# Patient Record
Sex: Female | Born: 1950 | Race: Black or African American | Hispanic: No | Marital: Married | Smoking: Never smoker
Health system: Southern US, Community
[De-identification: ages and names within clinical notes are randomized; demographics above are authoritative.]

## PROBLEM LIST (undated history)

## (undated) DIAGNOSIS — N189 Chronic kidney disease, unspecified: Secondary | ICD-10-CM

## (undated) DIAGNOSIS — I252 Old myocardial infarction: Secondary | ICD-10-CM

## (undated) DIAGNOSIS — N183 Chronic kidney disease, stage 3 unspecified: Secondary | ICD-10-CM

## (undated) DIAGNOSIS — N2581 Secondary hyperparathyroidism of renal origin: Secondary | ICD-10-CM

## (undated) DIAGNOSIS — Z8711 Personal history of peptic ulcer disease: Secondary | ICD-10-CM

## (undated) DIAGNOSIS — G500 Trigeminal neuralgia: Secondary | ICD-10-CM

## (undated) DIAGNOSIS — I3139 Other pericardial effusion (noninflammatory): Secondary | ICD-10-CM

## (undated) DIAGNOSIS — E119 Type 2 diabetes mellitus without complications: Secondary | ICD-10-CM

## (undated) DIAGNOSIS — S71109A Unspecified open wound, unspecified thigh, initial encounter: Secondary | ICD-10-CM

## (undated) DIAGNOSIS — I5032 Chronic diastolic (congestive) heart failure: Secondary | ICD-10-CM

## (undated) DIAGNOSIS — E1143 Type 2 diabetes mellitus with diabetic autonomic (poly)neuropathy: Secondary | ICD-10-CM

## (undated) DIAGNOSIS — I2510 Atherosclerotic heart disease of native coronary artery without angina pectoris: Secondary | ICD-10-CM

## (undated) DIAGNOSIS — R569 Unspecified convulsions: Secondary | ICD-10-CM

## (undated) DIAGNOSIS — B465 Mucormycosis, unspecified: Secondary | ICD-10-CM

## (undated) DIAGNOSIS — Z973 Presence of spectacles and contact lenses: Secondary | ICD-10-CM

## (undated) DIAGNOSIS — Z923 Personal history of irradiation: Secondary | ICD-10-CM

## (undated) DIAGNOSIS — Z9189 Other specified personal risk factors, not elsewhere classified: Secondary | ICD-10-CM

## (undated) DIAGNOSIS — I10 Essential (primary) hypertension: Secondary | ICD-10-CM

## (undated) DIAGNOSIS — Z860100 Personal history of colon polyps, unspecified: Secondary | ICD-10-CM

## (undated) DIAGNOSIS — E785 Hyperlipidemia, unspecified: Secondary | ICD-10-CM

## (undated) DIAGNOSIS — D631 Anemia in chronic kidney disease: Secondary | ICD-10-CM

## (undated) DIAGNOSIS — E010 Iodine-deficiency related diffuse (endemic) goiter: Secondary | ICD-10-CM

## (undated) DIAGNOSIS — Z9221 Personal history of antineoplastic chemotherapy: Secondary | ICD-10-CM

## (undated) DIAGNOSIS — Z87442 Personal history of urinary calculi: Secondary | ICD-10-CM

## (undated) DIAGNOSIS — E039 Hypothyroidism, unspecified: Secondary | ICD-10-CM

## (undated) DIAGNOSIS — I313 Pericardial effusion (noninflammatory): Secondary | ICD-10-CM

## (undated) DIAGNOSIS — K219 Gastro-esophageal reflux disease without esophagitis: Secondary | ICD-10-CM

## (undated) DIAGNOSIS — M109 Gout, unspecified: Secondary | ICD-10-CM

## (undated) DIAGNOSIS — Z8673 Personal history of transient ischemic attack (TIA), and cerebral infarction without residual deficits: Secondary | ICD-10-CM

## (undated) DIAGNOSIS — M1990 Unspecified osteoarthritis, unspecified site: Secondary | ICD-10-CM

## (undated) DIAGNOSIS — Z940 Kidney transplant status: Secondary | ICD-10-CM

## (undated) DIAGNOSIS — H5440 Blindness, one eye, unspecified eye: Secondary | ICD-10-CM

## (undated) DIAGNOSIS — C50919 Malignant neoplasm of unspecified site of unspecified female breast: Secondary | ICD-10-CM

## (undated) DIAGNOSIS — K3184 Gastroparesis: Secondary | ICD-10-CM

## (undated) DIAGNOSIS — G4733 Obstructive sleep apnea (adult) (pediatric): Secondary | ICD-10-CM

## (undated) HISTORY — DX: Malignant neoplasm of unspecified site of unspecified female breast: C50.919

## (undated) HISTORY — DX: Obstructive sleep apnea (adult) (pediatric): G47.33

## (undated) HISTORY — DX: Personal history of colon polyps, unspecified: Z86.0100

## (undated) HISTORY — PX: CARDIAC CATHETERIZATION: SHX172

## (undated) HISTORY — DX: Unspecified osteoarthritis, unspecified site: M19.90

## (undated) HISTORY — DX: Personal history of colonic polyps: Z86.010

## (undated) HISTORY — DX: Unspecified convulsions: R56.9

## (undated) HISTORY — PX: TRANSTHORACIC ECHOCARDIOGRAM: SHX275

## (undated) HISTORY — DX: Blindness, one eye, unspecified eye: H54.40

## (undated) HISTORY — DX: Gastro-esophageal reflux disease without esophagitis: K21.9

## (undated) HISTORY — PX: CATARACT EXTRACTION W/ INTRAOCULAR LENS  IMPLANT, BILATERAL: SHX1307

## (undated) HISTORY — PX: VAGINAL HYSTERECTOMY: SUR661

## (undated) HISTORY — DX: Hyperlipidemia, unspecified: E78.5

## (undated) HISTORY — PX: CARDIOVASCULAR STRESS TEST: SHX262

## (undated) HISTORY — DX: Gout, unspecified: M10.9

## (undated) HISTORY — DX: Chronic diastolic (congestive) heart failure: I50.32

---

## 2001-01-14 HISTORY — PX: EYE SURGERY: SHX253

## 2006-01-14 HISTORY — PX: CHOLECYSTECTOMY: SHX55

## 2006-04-15 HISTORY — PX: KIDNEY TRANSPLANT: SHX239

## 2006-05-15 HISTORY — PX: TRANSPLANTATION RENAL: SUR1385

## 2008-01-15 HISTORY — PX: BREAST BIOPSY: SHX20

## 2009-01-31 HISTORY — PX: OTHER SURGICAL HISTORY: SHX169

## 2009-02-14 LAB — HM COLONOSCOPY: HM Colonoscopy: ABNORMAL

## 2009-06-21 ENCOUNTER — Ambulatory Visit: Payer: Self-pay | Admitting: Cardiology

## 2009-06-21 ENCOUNTER — Inpatient Hospital Stay (HOSPITAL_COMMUNITY): Admission: EM | Admit: 2009-06-21 | Discharge: 2009-06-25 | Payer: Self-pay | Admitting: Emergency Medicine

## 2009-06-22 ENCOUNTER — Encounter: Payer: Self-pay | Admitting: Cardiology

## 2009-06-23 ENCOUNTER — Encounter: Payer: Self-pay | Admitting: Cardiology

## 2009-06-28 ENCOUNTER — Telehealth: Payer: Self-pay | Admitting: Cardiology

## 2009-07-14 DIAGNOSIS — J45909 Unspecified asthma, uncomplicated: Secondary | ICD-10-CM | POA: Insufficient documentation

## 2009-07-14 DIAGNOSIS — K219 Gastro-esophageal reflux disease without esophagitis: Secondary | ICD-10-CM

## 2009-07-14 DIAGNOSIS — Z87442 Personal history of urinary calculi: Secondary | ICD-10-CM | POA: Insufficient documentation

## 2009-07-14 DIAGNOSIS — E1159 Type 2 diabetes mellitus with other circulatory complications: Secondary | ICD-10-CM | POA: Insufficient documentation

## 2009-07-14 DIAGNOSIS — I2510 Atherosclerotic heart disease of native coronary artery without angina pectoris: Secondary | ICD-10-CM | POA: Insufficient documentation

## 2009-07-14 DIAGNOSIS — I119 Hypertensive heart disease without heart failure: Secondary | ICD-10-CM

## 2009-07-14 DIAGNOSIS — Z940 Kidney transplant status: Secondary | ICD-10-CM | POA: Insufficient documentation

## 2009-07-14 DIAGNOSIS — I214 Non-ST elevation (NSTEMI) myocardial infarction: Secondary | ICD-10-CM | POA: Insufficient documentation

## 2009-08-04 ENCOUNTER — Ambulatory Visit: Payer: Self-pay | Admitting: Cardiology

## 2009-08-14 ENCOUNTER — Ambulatory Visit: Payer: Self-pay | Admitting: Cardiology

## 2009-09-11 ENCOUNTER — Encounter: Payer: Self-pay | Admitting: Internal Medicine

## 2009-09-27 ENCOUNTER — Ambulatory Visit: Payer: Self-pay | Admitting: Cardiology

## 2009-09-27 ENCOUNTER — Telehealth: Payer: Self-pay | Admitting: Cardiology

## 2009-09-29 ENCOUNTER — Telehealth: Payer: Self-pay | Admitting: Cardiology

## 2009-09-29 LAB — CONVERTED CEMR LAB
BUN: 19 mg/dL (ref 6–23)
CO2: 29 meq/L (ref 19–32)
Calcium: 9.7 mg/dL (ref 8.4–10.5)
Chloride: 106 meq/L (ref 96–112)
Creatinine, Ser: 0.8 mg/dL (ref 0.4–1.2)
GFR calc non Af Amer: 91.89 mL/min (ref >60)
Glucose, Bld: 105 mg/dL — ABNORMAL HIGH (ref 70–99)
Potassium: 4.4 meq/L (ref 3.5–5.1)
Sodium: 142 meq/L (ref 135–145)

## 2009-10-03 ENCOUNTER — Ambulatory Visit: Payer: Self-pay | Admitting: Cardiology

## 2009-10-03 DIAGNOSIS — R609 Edema, unspecified: Secondary | ICD-10-CM

## 2009-10-09 LAB — CONVERTED CEMR LAB
BUN: 22 mg/dL (ref 6–23)
CO2: 29 meq/L (ref 19–32)
Calcium: 10 mg/dL (ref 8.4–10.5)
Chloride: 103 meq/L (ref 96–112)
Creatinine, Ser: 1.1 mg/dL (ref 0.4–1.2)
GFR calc non Af Amer: 69.08 mL/min (ref >60)
Glucose, Bld: 170 mg/dL — ABNORMAL HIGH (ref 70–99)
Potassium: 4.5 meq/L (ref 3.5–5.1)
Sodium: 140 meq/L (ref 135–145)

## 2009-10-10 ENCOUNTER — Ambulatory Visit: Payer: Self-pay | Admitting: Cardiology

## 2009-10-10 ENCOUNTER — Telehealth: Payer: Self-pay | Admitting: Cardiology

## 2009-10-17 ENCOUNTER — Ambulatory Visit: Payer: Self-pay | Admitting: Internal Medicine

## 2009-10-17 ENCOUNTER — Inpatient Hospital Stay (HOSPITAL_COMMUNITY): Admission: EM | Admit: 2009-10-17 | Discharge: 2009-10-22 | Payer: Self-pay | Admitting: Emergency Medicine

## 2009-10-17 ENCOUNTER — Encounter: Payer: Self-pay | Admitting: Cardiology

## 2009-11-01 ENCOUNTER — Telehealth (INDEPENDENT_AMBULATORY_CARE_PROVIDER_SITE_OTHER): Payer: Self-pay | Admitting: *Deleted

## 2009-11-03 ENCOUNTER — Telehealth: Payer: Self-pay | Admitting: Cardiology

## 2009-11-08 ENCOUNTER — Encounter: Payer: Self-pay | Admitting: Cardiology

## 2009-11-14 ENCOUNTER — Ambulatory Visit: Payer: Self-pay | Admitting: Internal Medicine

## 2009-11-14 ENCOUNTER — Emergency Department (HOSPITAL_COMMUNITY): Admission: EM | Admit: 2009-11-14 | Discharge: 2009-11-15 | Payer: Self-pay | Admitting: Emergency Medicine

## 2009-11-14 DIAGNOSIS — K3184 Gastroparesis: Secondary | ICD-10-CM

## 2009-11-14 DIAGNOSIS — I2510 Atherosclerotic heart disease of native coronary artery without angina pectoris: Secondary | ICD-10-CM | POA: Insufficient documentation

## 2009-11-14 DIAGNOSIS — H40009 Preglaucoma, unspecified, unspecified eye: Secondary | ICD-10-CM | POA: Insufficient documentation

## 2009-11-14 DIAGNOSIS — Z860100 Personal history of colon polyps, unspecified: Secondary | ICD-10-CM | POA: Insufficient documentation

## 2009-11-14 DIAGNOSIS — I519 Heart disease, unspecified: Secondary | ICD-10-CM

## 2009-11-14 DIAGNOSIS — E785 Hyperlipidemia, unspecified: Secondary | ICD-10-CM | POA: Insufficient documentation

## 2009-11-14 DIAGNOSIS — K279 Peptic ulcer, site unspecified, unspecified as acute or chronic, without hemorrhage or perforation: Secondary | ICD-10-CM | POA: Insufficient documentation

## 2009-11-14 DIAGNOSIS — M545 Low back pain: Secondary | ICD-10-CM

## 2009-11-14 DIAGNOSIS — Z8679 Personal history of other diseases of the circulatory system: Secondary | ICD-10-CM | POA: Insufficient documentation

## 2009-11-14 DIAGNOSIS — D630 Anemia in neoplastic disease: Secondary | ICD-10-CM | POA: Insufficient documentation

## 2009-11-14 DIAGNOSIS — I252 Old myocardial infarction: Secondary | ICD-10-CM | POA: Insufficient documentation

## 2009-11-14 DIAGNOSIS — E1139 Type 2 diabetes mellitus with other diabetic ophthalmic complication: Secondary | ICD-10-CM | POA: Insufficient documentation

## 2009-11-14 DIAGNOSIS — M549 Dorsalgia, unspecified: Secondary | ICD-10-CM | POA: Insufficient documentation

## 2009-11-14 DIAGNOSIS — R062 Wheezing: Secondary | ICD-10-CM

## 2009-11-14 LAB — CONVERTED CEMR LAB
BUN: 22 mg/dL (ref 6–23)
Basophils Absolute: 0 10*3/uL (ref 0.0–0.1)
Basophils Relative: 0.2 % (ref 0.0–3.0)
CO2: 27 meq/L (ref 19–32)
Calcium: 9.5 mg/dL (ref 8.4–10.5)
Chloride: 106 meq/L (ref 96–112)
Cholesterol: 103 mg/dL (ref 0–200)
Creatinine, Ser: 0.9 mg/dL (ref 0.4–1.2)
Eosinophils Absolute: 0.1 10*3/uL (ref 0.0–0.7)
Eosinophils Relative: 1.1 % (ref 0.0–5.0)
Folate: 19.1 ng/mL
GFR calc non Af Amer: 80.43 mL/min (ref >60)
Glucose, Bld: 250 mg/dL — ABNORMAL HIGH (ref 70–99)
HCT: 40.6 % (ref 36.0–46.0)
HDL: 37.8 mg/dL — ABNORMAL LOW (ref >39.00)
Hemoglobin: 13.4 g/dL (ref 12.0–15.0)
Hgb A1c MFr Bld: 8 % — ABNORMAL HIGH (ref 4.6–6.5)
Iron: 48 ug/dL (ref 42–145)
LDL Cholesterol: 36 mg/dL (ref 0–99)
Lymphocytes Relative: 9.6 % — ABNORMAL LOW (ref 12.0–46.0)
Lymphs Abs: 0.7 10*3/uL (ref 0.7–4.0)
MCHC: 32.9 g/dL (ref 30.0–36.0)
MCV: 86.4 fL (ref 78.0–100.0)
Monocytes Absolute: 0.4 10*3/uL (ref 0.1–1.0)
Monocytes Relative: 5.4 % (ref 3.0–12.0)
Neutro Abs: 5.8 10*3/uL (ref 1.4–7.7)
Neutrophils Relative %: 83.7 % — ABNORMAL HIGH (ref 43.0–77.0)
Platelets: 273 10*3/uL (ref 150.0–400.0)
Potassium: 4.8 meq/L (ref 3.5–5.1)
Pro B Natriuretic peptide (BNP): 625.3 pg/mL — ABNORMAL HIGH (ref 0.0–100.0)
RBC: 4.69 M/uL (ref 3.87–5.11)
RDW: 15.8 % — ABNORMAL HIGH (ref 11.5–14.6)
Saturation Ratios: 17.4 % — ABNORMAL LOW (ref 20.0–50.0)
Sodium: 139 meq/L (ref 135–145)
TSH: 0.46 microintl units/mL (ref 0.35–5.50)
Total CHOL/HDL Ratio: 3
Transferrin: 197.4 mg/dL — ABNORMAL LOW (ref 212.0–360.0)
Triglycerides: 146 mg/dL (ref 0.0–149.0)
VLDL: 29.2 mg/dL (ref 0.0–40.0)
Vitamin B-12: 446 pg/mL (ref 211–911)
WBC: 6.9 10*3/uL (ref 4.5–10.5)

## 2009-11-22 ENCOUNTER — Telehealth: Payer: Self-pay | Admitting: Internal Medicine

## 2009-11-24 ENCOUNTER — Encounter (INDEPENDENT_AMBULATORY_CARE_PROVIDER_SITE_OTHER): Payer: Self-pay | Admitting: *Deleted

## 2009-11-27 ENCOUNTER — Ambulatory Visit: Payer: Self-pay | Admitting: Internal Medicine

## 2009-11-27 ENCOUNTER — Telehealth: Payer: Self-pay | Admitting: Internal Medicine

## 2009-11-27 DIAGNOSIS — I5032 Chronic diastolic (congestive) heart failure: Secondary | ICD-10-CM

## 2009-11-28 ENCOUNTER — Encounter (INDEPENDENT_AMBULATORY_CARE_PROVIDER_SITE_OTHER): Payer: Self-pay | Admitting: *Deleted

## 2009-11-29 ENCOUNTER — Encounter: Payer: Self-pay | Admitting: Internal Medicine

## 2009-11-30 ENCOUNTER — Encounter: Payer: Self-pay | Admitting: Internal Medicine

## 2009-12-04 ENCOUNTER — Telehealth: Payer: Self-pay | Admitting: Internal Medicine

## 2009-12-04 ENCOUNTER — Ambulatory Visit: Payer: Self-pay | Admitting: Cardiology

## 2009-12-06 LAB — CONVERTED CEMR LAB
BUN: 23 mg/dL (ref 6–23)
CO2: 29 meq/L (ref 19–32)
Calcium: 9.3 mg/dL (ref 8.4–10.5)
Chloride: 102 meq/L (ref 96–112)
Creatinine, Ser: 1 mg/dL (ref 0.4–1.2)
GFR calc non Af Amer: 69.8 mL/min (ref >60)
Glucose, Bld: 59 mg/dL — ABNORMAL LOW (ref 70–99)
Potassium: 4.9 meq/L (ref 3.5–5.1)
Sodium: 141 meq/L (ref 135–145)

## 2009-12-13 ENCOUNTER — Telehealth (INDEPENDENT_AMBULATORY_CARE_PROVIDER_SITE_OTHER): Payer: Self-pay | Admitting: *Deleted

## 2010-02-02 ENCOUNTER — Encounter: Payer: Self-pay | Admitting: Internal Medicine

## 2010-02-09 ENCOUNTER — Encounter: Payer: Self-pay | Admitting: Internal Medicine

## 2010-02-12 ENCOUNTER — Other Ambulatory Visit: Payer: Self-pay | Admitting: Internal Medicine

## 2010-02-12 DIAGNOSIS — E049 Nontoxic goiter, unspecified: Secondary | ICD-10-CM

## 2010-02-13 NOTE — Assessment & Plan Note (Signed)
Summary: eph/pt aware she is seeing [facility] pa[facility]   Visit Type:  Follow-up Primary Provider:  Blanca Friend .[place]  CC:  no complaints.  History of Present Illness: Primary Cardiologist:  Dr. Valera Castle   Monica Lee is a 60 year old female with a history of non-obstructive coronary artery disease by cardiac catheterization in [place] who has been admitted to the hospital twice this year with chest discomfort and elevated cardiac enzymes.  With a recent reassuring cardiac cath, no further workup was pursued.  Her most recent admission was 10/17/09.  She was admitted with hypertensive crisis and acute on chronic diastolic heart failure.  She was diuresed and her blood pressure was managed.  An echocardiogram demonstrated severe LVH with an ejection fraction of 50-55% and mild left atrial enlargement.  Her highest troponin was 0.15.  She recently went to the emergency room on November 1.  She had pulmonary vascular congestion on the chest x-ray and a BNP of 352.  She was asked to followup here.  Of note, she does have a history of end-stage renal disease.  She is status post renal transplant with normal creatinines now.  Today, she states that she is doing well.  She denies significant shortness of breath.  She describes New York Heart Association class II symptoms.  She denies syncope or near-syncope.  She denies orthopnea, PND or significant pedal edema.  She does feel somewhat bloated.  She has been tracking her weights at home.  She feels better when she is around 184 pounds.  Recently her weight has gone up.  She denies chest pain.  Current Medications (verified): 1)  Aspirin Ec 325 Mg Tbec (Aspirin) .... Take One Tablet By Mouth Daily 2)  Pravastatin Sodium 20 Mg Tabs (Pravastatin Sodium) .... 1/2 Tab At Bedtime 3)  Imuran 50 Mg Tabs (Azathioprine) .Marland Kitchen.. 1 Tab Two Times A Day 4)  Clonidine Hcl 0.2 Mg Tabs (Clonidine Hcl) .Marland Kitchen.. 1 Tab Three Times A Day 5)  Minoxidil 2.5 Mg Tabs  (Minoxidil) .Marland Kitchen.. 1 Tab Two Times A Day 6)  Lantus 100 Unit/ml Soln (Insulin Glargine) .... 60 Units Once Daily 7)  Magnesium Oxide 250 Mg Tabs (Magnesium Oxide) .Marland Kitchen.. 1 Tab Two Times A Day 8)  Metoprolol Tartrate 100 Mg Tabs (Metoprolol Tartrate) .... Take 1 and 1/2 Tablets Twice A Day 9)  Novolog 100 Unit/ml Soln (Insulin Aspart) .... 40 Units Three Times A Day W/meals 10)  Prednisone 10 Mg Tabs (Prednisone) .Marland Kitchen.. 1 Tab Once Daily 11)  Prograf 1 Mg Caps (Tacrolimus) .... 6 Caps Once Daily 12)  Protonix 40 Mg Tbec (Pantoprazole Sodium) .Marland Kitchen.. 1 Tab Once Daily 13)  Multivitamins  Tabs (Multiple Vitamin) .Marland Kitchen.. 1 By Mouth Once Daily 14)  Miralax  Powd (Polyethylene Glycol 3350) .Marland Kitchen.. 1 Scoop Daily 15)  Docusate Sodium 100 Mg Caps (Docusate Sodium) .Marland Kitchen.. 1 By Mouth Two Times A Day At Bedtime 16)  Combigan 0.2-0.5 % Soln (Brimonidine Tartrate-Timolol) .Marland Kitchen.. 1 Drop Both Eyes Two Times A Day 17)  Isosorbide Mononitrate Cr 60 Mg Xr24h-Tab (Isosorbide Mononitrate) .Marland Kitchen.. 1po Once Daily 18)  Nitrostat 0.4 Mg Subl (Nitroglycerin) .... Use Asd 19)  Norvasc 10 Mg Tabs (Amlodipine Besylate) .Marland Kitchen.. 1 By Mouth Once Daily 20)  Novolog Flexpen 100 Unit/ml Soln (Insulin Aspart) .... 40 Units Three Times A Day  Allergies: 1)  ! Darvocet 2)  ! Keflex  Past History:  Past Medical History: Reviewed history from 11/14/2009 and no changes required. MYOCARDIAL INFARCTION, ACUTE, SUBENDOCARDIAL (ICD-410.70) CAD,  NATIVE VESSEL (ICD-414.01) HYPERTENSION (ICD-401.9) NEPHROLITHIASIS, HX OF (ICD-V13.01) KIDNEY TRANSPLANTATION (ICD-V42.0) DIABETES MELLITUS, TYPE II (ICD-250.00) GERD (ICD-530.81) ASTHMA (ICD-493.90) MD roster:  card:  Dr Daleen Squibb                    renal: [facility] - Dr Ervin Knack                     GI:  [place]  - Dr patel gastroparesis ?? 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  Diabetic retinopathy  Past Surgical History: Reviewed history from 11/14/2009 and no changes required. Status post renal transplant Status post cardiac catheterization   Hysterectomy Cholecystectomy Cataract extraction s/p intestinal perforation surgury jan 2011 s/p breast biopsy 2010  Vital Signs:  Patient profile:   60 year old female Height:      65 inches Weight:      189 pounds Pulse rate:   80 / minute Pulse rhythm:   regular BP sitting:   130 / 77  (left arm)  Vitals Entered By: Jacquelin Hawking, CMA (November 27, 2009 9:35 AM)  Physical Exam  General:  Well nourished, well developed, in no acute distress HEENT: normal Neck: no JVD Cardiac:  normal S1, S2; RRR; no murmur Lungs:  clear to auscultation bilaterally, no wheezing, rhonchi or rales Abd: soft, nontender, no hepatomegaly Ext: trace edema Vascular: no carotid  bruits Skin: warm and dry Neuro:  CNs 2-12 intact, no focal abnormalities noted    Impression & Recommendations:  Problem # 1:  CORONARY ARTERY DISEASE (ICD-414.00)  No anginal symptoms. Continue ASA.  Problem # 2:  CHRONIC DIASTOLIC HEART FAILURE (ICD-428.32)  Her baseline weight is probably somewhere around 183-184 pounds. I have asked her to take Lasix and K+ for 3 days. She will have a BMET in 1 week. After the above, she will monitor her weight and take Lasix if her weight goes above 187 pounds or if she has symptoms.  Problem # 3:  HYPERTENSION (ICD-401.9)  Borderline control. But, she has been out of Isosorbide for a few days.  Problem # 4:  KIDNEY TRANSPLANTATION (ICD-V42.0)  She is to be set up with a nephrologist soon.  Problem # 5:  GERD (ICD-530.81)  Patient Instructions: 1)  Your physician recommends that you schedule a follow-up appointment in:3  months with Dr. Daleen Squibb. 2)  Take Lasix (Furosemide) 40 mg with Potassium 20 mEq once daily for 3 days. 3)  Then, take as needed.  If you weigh more than 187 pounds, you can take Lasix + Potassium on those days.  If your weight is up and you feel short of breath, you should take the Lasix and call us.  4)  Your physician recommends that you return for lab work in: One week for BMET . [phone]. Prescriptions: ISOSORBIDE MONONITRATE CR 60 MG XR24H-TAB (ISOSORBIDE MONONITRATE) 1po once daily  #90 x 3   Entered by:   Ollen Gross, RN, BSN   Authorized by:   Tereso Newcomer PA-C   Signed by:   Ollen Gross, RN, BSN on 11/27/2009   Method used:   Electronically to        [facility] [phone]* (retail)  [address]       [place], [place]  [address]       Ph: [phone]       Fax: [phone]   RxID:   [id number]  I have personally reviewed the prescriptions today for accuracy. Tereso Newcomer PA-C  November 27, 2009 10:27 AM

## 2010-02-13 NOTE — Progress Notes (Signed)
Summary: chestpressure  Phone Note Call from Patient Call back at Home Phone [phone]   Caller: Daughter-CHRISTINE [phone] Reason for Call: Talk to Nurse Summary of Call: per pt daughter called pt c/o chestpressure. pcp was not contacted. Initial call taken by: Lorne Skeens,  June 28, 2009 8:41 AM  Follow-up for Phone Call        [facility] Scherrie Bateman, LPN  June 28, 2009 11:41 AM lmtcb Scherrie Bateman, LPN  June 29, 2009 8:42 AM  SPOKE WITH DAUGHTER THIS AM  WAS DISCONNECTED WILL CALL BACK LATER. Follow-up by: Scherrie Bateman, LPN,  June 29, 2009 11:11 AM  Additional Follow-up for Phone Call Additional follow up Details #1::        LMTCB Scherrie Bateman, LPN  June 29, 2009 3:17 PM LEFT MESSAGE FOR DAUGHTER TO CALL TOMORROW TO CONT WITH MESSAGE Preliminarily reviewed. Forwarded to MD desktop for review and signature   LMTCB Scherrie Bateman, LPN  June 30, 2009 10:28 AM    Additional Follow-up for Phone Call Additional follow up Details #2::    CALLED HOME NUMBER AND SPOKE WITH PT  PER PT FEELS GOOD NO MORE EPISODES OF CHEST PAIN. ALSO C/O OF ONE EPISODE OF DIARRHEA WHEN HAD CHEST PAIN .  NEEDS  AN APPT PER HOSPITAL DISCHARGE. APPT SCHEDULED  WITH DR Yolanda Dockendorf FOR JULY 22,2011 AT 4:30  Follow-up by: Scherrie Bateman, LPN,  June 30, 2009 10:43 AM  Additional Follow-up for Phone Call Additional follow up Details #3:: Details for Additional Follow-up Action Taken: ok  Reviewed Juanito Doom, MD

## 2010-02-13 NOTE — Progress Notes (Signed)
Summary: swelling / waiting on BMP results  Phone Note Call from Patient   Caller: Patient [phone] Reason for Call: Talk to Nurse Summary of Call: pt has fluid build up-stomach swollen, face, her legs and feet have gone down some-weighs about 10lbs more in the past week or so-pls call [phone] Initial call taken by: Glynda Jaeger,  September 27, 2009 9:13 AM  Follow-up for Phone Call        pt states that initially she gained 13 pounds of fluid and this morning she is up 10 pounds. she is taking lasix as prescribed. adv pt will discuss w/dod and adv. Claris Gladden, RN DOD Dr. Roswell Miners to have pt take Lasix two times a day and per pt she is already taking pill two times a day, she will come in now for [facility] and will have Dr. Vern Claude nurse see if she can double book a day so that she can be seen next week. Claris Gladden, RN 09-27-09 1034 f/u appt 9/20. await lab results. Claris Gladden, RN [phone]  Additional Follow-up for Phone Call Additional follow up Details #1::        BMP normal. Dr. Johney Frame recommend checking how long pt has been taking lasix two times a day and if just 1 week have her take 80mg  am and 40mg  pm. lft msg for pt to c/b Claris Gladden, RN 9/14 1545 spoke to pt and she has been taking 40mg  lasix two times a day since june 12. this past sat she had mouth surgery and took keflex and stated has had swelling since. will discuss w/dod. Claris Gladden, RN     Additional Follow-up for Phone Call Additional follow up Details #2::    09/27/09 1552 adv pt to take 80mg  in am and 40mg  lasix pm for next 3 days. Claris Gladden, RN Follow-up by: Gaylord Shih, MD, [facility],  September 29, 2009 10:43 AM  Prescriptions: FUROSEMIDE 40 MG TABS (FUROSEMIDE) 1 tab once daily as needed  #30 x 6   Entered by:   Claris Gladden RN   Authorized by:   Gaylord Shih, MD, [facility]   Signed by:   Claris Gladden RN on 09/27/2009   Method used:   Electronically to        [facility]  [phone]* (retail)       [address]       [place], [place]  [address]       Ph: [phone]       Fax: [phone]   RxID:   [id number]

## 2010-02-13 NOTE — Consult Note (Signed)
Summary: [facility]   [facility]   Imported By: Roderic Ovens 10/31/2009 15:07:14  _____________________________________________________________________  External Attachment:    Type:   Image     Comment:   External Document

## 2010-02-13 NOTE — Consult Note (Signed)
Summary: Consultation Report  Consultation Report   Imported By: Debby Freiberg 07/18/2009 13:49:23  _____________________________________________________________________  External Attachment:    Type:   Image     Comment:   External Document

## 2010-02-13 NOTE — Letter (Signed)
Summary: Generic Letter  [facility]  [address] [place], [place] [address]   Phone: [phone]  Fax: [phone]    11/28/2009  Emaley Reier [address] [place], [place]  [address]  To Whom it may concern,  This is to inform that Lillyrose Reitan is on Protonix 40mg . Please contact our office for further questions.           Sincerely,   Dr. Oliver Barre

## 2010-02-13 NOTE — Letter (Signed)
Summary: Generic Letter  [facility]  [address] [place], [place] [address]   Phone: [phone]  Fax: [phone]    11/24/2009  Monica Lee [address] [place], [place]  [address]  Dear Ms. Templer,  Our office has been trying to contact you because we recieved notification form [facility] in regards to your prescription for Prontonix 40mg . Your insurance company has decided that a coverage review is need for this prescription because you have not tried and failed on their preferred drugs, Omeprazole or Nexium. Because you have not tried these medicines the review is likely to be denied.  Dr Estil Daft that Nexium 40mg  1 tablet by mouth once daily  will be a suitable substitute for you and we have been trying to contact you for your approval to change to this medicine. Would you please contact our office at your earliest convinence to discuus this matter.   Thank you for your prompt response.       Sincerely,   Margaret Pyle, CMA

## 2010-02-13 NOTE — Progress Notes (Signed)
Summary: PA protonix  Phone Note From Pharmacy   Summary of Call: Received fax from pharmacy stating that PA is required on Protonix 40mg  tabs, need to call [phone]. Called insurance spoke with [place] who stated that alternatives are  nexium 40mg  or Omeprazole 40mg . Would you like to switch or proceed with prior auth. Please advise Thanks.Alvy Beal Archie CMA  November 22, 2009 2:22 PM   Follow-up for Phone Call        please call pt - I think Nexium 40mg  would be fine;  if she agrees we can change to the nexium Follow-up by: Corwin Levins MD,  November 22, 2009 5:07 PM  Additional Follow-up for Phone Call Additional follow up Details #1::        Left detailed vm on pt's cell # listed on HIPPA form. Req that she call office back regarding above.............Marland KitchenLamar Sprinkles, CMA  November 22, 2009 5:34 PM  left message on machine in detail for pt regarding PA [phone]) per HIPAA. Margaret Pyle, CMA  November 24, 2009 8:03 AM     Additional Follow-up for Phone Call Additional follow up Details #2::    called and left vm.  Follow-up by: Alysia Penna,  November 24, 2009 1:49 PM  Additional Follow-up for Phone Call Additional follow up Details #3:: Details for Additional Follow-up Action Taken: Letter has been generated requesting pt to contact our office on this matter. phone note closed until further contact from pt. Additional Follow-up by: Margaret Pyle, CMA,  November 24, 2009 2:21 PM

## 2010-02-13 NOTE — Assessment & Plan Note (Signed)
Summary: bp check/dfg  Nurse Visit   Vital Signs:  Patient profile:   60 year old female Height:      65 inches Weight:      180.25 pounds Pulse rate:   65 / minute Pulse rhythm:   regular BP sitting:   138 / 74  (left arm) Cuff size:   large   Allergies: 1)  ! Darvocet  Appended Document: bp check/dfg Mrs. Ledwith BP on arrival was 146/90.  Pulse 70  After 5 minutes B/P was as documented above.  Pt brought in current medication bottles and it was noted the Metoprolol had been filled improperly( 25mg  instead of 100mg  as e-scribe) by the pharmacist.  She had not started the new bottle but, commented the pills were much smaller.  Pharmacist notified and corrected.  Lisabeth Devoid RN

## 2010-02-13 NOTE — Progress Notes (Signed)
Summary: Rx change  Phone Note Call from Patient Call back at Home Phone [phone]   Summary of Call: Pt called stating that change from Protonix to Nexium would be okay, Rx to [facility] [address] Initial call taken by: Margaret Pyle, CMA,  December 04, 2009 4:04 PM    New/Updated Medications: NEXIUM 40 MG CPDR (ESOMEPRAZOLE MAGNESIUM) 1 tab by mouth once daily Prescriptions: NEXIUM 40 MG CPDR (ESOMEPRAZOLE MAGNESIUM) 1 tab by mouth once daily  #90 x 1   Entered by:   Margaret Pyle, CMA   Authorized by:   Corwin Levins MD   Signed by:   Margaret Pyle, CMA on 12/04/2009   Method used:   Electronically to        [facility] [phone]* (retail)       [address]       [place], [place]  [address]       Ph: [phone]       Fax: [phone]   RxID:   [id number]

## 2010-02-13 NOTE — Assessment & Plan Note (Signed)
Summary: BP CHECK  Nurse Visit   Vital Signs:  Patient profile:   60 year old female Weight:      189.50 pounds Pulse rate:   60 / minute Pulse rhythm:   regular BP sitting:   132 / 82  (left arm) Cuff size:   large  Vitals Entered By: Sherri Rad, RN, BSN (October 10, 2009 11:31 AM) CC: No cardiac complaints   Current Medications (verified): 1)  Aspirin Ec 325 Mg Tbec (Aspirin) .... Take One Tablet By Mouth Daily 2)  Pravastatin Sodium 20 Mg Tabs (Pravastatin Sodium) .Marland Kitchen.. 1 Tab At Bedtime 3)  Imuran 50 Mg Tabs (Azathioprine) .Marland Kitchen.. 1 Tab Two Times A Day 4)  Clonidine Hcl 0.2 Mg Tabs (Clonidine Hcl) .Marland Kitchen.. 1 Tab Three Times A Day 5)  Minoxidil 2.5 Mg Tabs (Minoxidil) .Marland Kitchen.. 1 Tab Two Times A Day 6)  Lantus 100 Unit/ml Soln (Insulin Glargine) .... 50 Units Once Daily 7)  Furosemide 40 Mg Tabs (Furosemide) .... Take Two Tablets By Mouth Every Morning and One Tablet By Mouth Every Evening 8)  Magnesium Oxide 250 Mg Tabs (Magnesium Oxide) .Marland Kitchen.. 1 Tab Two Times A Day 9)  Metoprolol Tartrate 100 Mg Tabs (Metoprolol Tartrate) .... Take 1 and 1/2 Tablets Twice A Day 10)  Novolog 100 Unit/ml Soln (Insulin Aspart) .... 40 Units Three Times A Day W/meals 11)  Potassium Chloride Crys Cr 20 Meq Cr-Tabs (Potassium Chloride Crys Cr) .Marland Kitchen.. 1 Tab Once Daily 12)  Prednisone 10 Mg Tabs (Prednisone) .Marland Kitchen.. 1 Tab Once Daily 13)  Prograf 1 Mg Caps (Tacrolimus) .... 6 Caps Once Daily 14)  Protonix 40 Mg Tbec (Pantoprazole Sodium) .Marland Kitchen.. 1 Tab Once Daily 15)  Zofran 4 Mg Tabs (Ondansetron Hcl) .Marland Kitchen.. 1 Tab As Needed  Allergies (verified): 1)  ! Darvocet 2)  ! Keflex  Visit Type:  BP check Primary Provider:  Blanca Friend .[place]  CC:  No cardiac complaints.  History of Present Illness: Reviewed pt's readings with Dr. Daleen Squibb. Per Dr. Daleen Squibb- no change to medications. The pt should have another bp & weight check in 1 week.  Prescriptions: FUROSEMIDE 40 MG TABS (FUROSEMIDE) take two tablets by mouth every  morning and one tablet by mouth every evening  #90 x 6   Entered by:   Sherri Rad, RN, BSN   Authorized by:   Gaylord Shih, MD, [facility]   Signed by:   Sherri Rad, RN, BSN on 10/10/2009   Method used:   Electronically to        [facility] [phone]* (retail)       [address]       [place], [place]  [address]       Ph: [phone]       Fax: [phone]   RxID:   [id number]   Appended Document: BP CHECK LMTCB re bp/wt check in 1 week. Mylo Red RN

## 2010-02-13 NOTE — Progress Notes (Signed)
  Walk in Patient Form Recieved " Pt new to area needs Doctor" sent to Message Nurse [facility]  November 01, 2009 12:57 PM     Appended Document:     Clinical Lists Changes  Orders: Added new Referral order of Nephrology Referral (Nephro) - Signed

## 2010-02-13 NOTE — Progress Notes (Signed)
Summary: refill meds  Phone Note Refill Request Call back at Home Phone [phone] Message from:  Patient on October 10, 2009 3:11 PM  Refills Requested: Medication #1:  MINOXIDIL 2.5 MG TABS 1 tab two times a day [facility] [address]    Method Requested: Fax to Local Pharmacy Initial call taken by: Lorne Skeens,  October 10, 2009 3:12 PM    Prescriptions: MINOXIDIL 2.5 MG TABS (MINOXIDIL) 1 tab two times a day  #60 x 11   Entered by:   Danielle Rankin, CMA   Authorized by:   Gaylord Shih, MD, [facility]   Signed by:   Danielle Rankin, CMA on 10/10/2009   Method used:   Electronically to        [facility] [phone]* (retail)       [address]       [place], [place]  [address]       Ph: [phone]       Fax: [phone]   RxID:   [id number]

## 2010-02-13 NOTE — Assessment & Plan Note (Signed)
Summary: 1wk f/u labs drawn on 09-27-09/sl   Visit Type:  6 mo f/u Primary Provider:  Blanca Friend [place]  CC:  pt weight is up 12.5 lbs since 08/2009......sob.Marland Kitchen...edema/abdomen/feet....denies any cp.  History of Present Illness: [name] comes in today as an add-on. She is call the office over the last couple weeks with increased weight gain of up to 12 pounds or so. She feels this was triggered by tooth extraction on 1 September.  We increased her furosemide to 80 mg in the morning and 40 in the evening. Her weight is dropped from 193-191.8. Electrolytes on the 14th were stable with a creatinine of 0.8 and a BUN of 19. Her potassium was 4.4.  Her edema has improved significantly. There she denies orthopnea or PND. She's had no chest pain. She is watching her sodium. She has trouble with her blood pressure all her life.  Current Medications (verified): 1)  Aspirin Ec 325 Mg Tbec (Aspirin) .... Take One Tablet By Mouth Daily 2)  Pravastatin Sodium 20 Mg Tabs (Pravastatin Sodium) .Marland Kitchen.. 1 Tab At Bedtime 3)  Imuran 50 Mg Tabs (Azathioprine) .Marland Kitchen.. 1 Tab Two Times A Day 4)  Clonidine Hcl 0.2 Mg Tabs (Clonidine Hcl) .Marland Kitchen.. 1 Tab Three Times A Day 5)  Minoxidil 2.5 Mg Tabs (Minoxidil) .Marland Kitchen.. 1 Tab Two Times A Day 6)  Lantus 100 Unit/ml Soln (Insulin Glargine) .... 50 Units Once Daily 7)  Furosemide 40 Mg Tabs (Furosemide) .Marland Kitchen.. 1 Tab Once Daily As Needed 8)  Magnesium Oxide 250 Mg Tabs (Magnesium Oxide) .Marland Kitchen.. 1 Tab Two Times A Day 9)  Metoprolol Tartrate 100 Mg Tabs (Metoprolol Tartrate) .... Take 1 and 1/2 Tablets Twice A Day 10)  Novolog 100 Unit/ml Soln (Insulin Aspart) .... 40 Units Three Times A Day W/meals 11)  Potassium Chloride Crys Cr 20 Meq Cr-Tabs (Potassium Chloride Crys Cr) .Marland Kitchen.. 1 Tab Once Daily 12)  Prednisone 10 Mg Tabs (Prednisone) .Marland Kitchen.. 1 Tab Once Daily 13)  Prograf 1 Mg Caps (Tacrolimus) .... 6 Caps Once Daily 14)  Protonix 40 Mg Tbec (Pantoprazole Sodium) .Marland Kitchen.. 1 Tab Once Daily 15)   Zofran 4 Mg Tabs (Ondansetron Hcl) .Marland Kitchen.. 1 Tab As Needed  Allergies: 1)  ! Darvocet 2)  ! Keflex  Past History:  Past Medical History: Last updated: 07/14/2009 MYOCARDIAL INFARCTION, ACUTE, SUBENDOCARDIAL (ICD-410.70) CAD, NATIVE VESSEL (ICD-414.01) HYPERTENSION (ICD-401.9) NEPHROLITHIASIS, HX OF (ICD-V13.01) KIDNEY TRANSPLANTATION (ICD-V42.0) DIABETES MELLITUS, TYPE II (ICD-250.00) GERD (ICD-530.81) ASTHMA (ICD-493.90)    Past Surgical History: Last updated: 07/14/2009 Status post renal transplant Status post cardiac catheterization as well as  hysterectomy, cholecystectomy, kidney transplant and cataract surgery.      Family History: Last updated: 07/14/2009  No family history of kidney disease.      Social History: Last updated: 07/14/2009 The patient worked at a sewing factory.  She is   married.  She has 4 children.  She does not drink or smoke.      Review of Systems       negative other history of present illness  Vital Signs:  Patient profile:   60 year old female Height:      65 inches Weight:      192.8 pounds BMI:     32.20 Pulse rate:   62 / minute Pulse rhythm:   irregular BP sitting:   132 / 100  (left arm) Cuff size:   large  Vitals Entered By: Danielle Rankin, CMA (October 03, 2009 1:02 PM)  Physical Exam  General:  obese.  no acute distress Head:  normocephalic and atraumatic Eyes:  Lasix as otherwise normal Mouth:  poor dentition.   Neck:  Neck supple, no JVD. No masses, thyromegaly or abnormal cervical nodes. Chest Kaidin Boehle:  no deformities or breast masses noted Lungs:  Clear bilaterally to auscultation and percussion. Heart:  MI nondisplaced, regular rate and rhythm, no gallop carotids full without bruits Msk:  decreased ROM.   Pulses:  diminished but present the lower extremities Extremities:  1+ left pedal edema and 1+ right pedal edema.   Neurologic:  Alert and oriented x 3. Skin:  Intact without lesions or rashes. Psych:  Normal  affect.   Problems:  Medical Problems Added: 1)  Dx of Edema  (ICD-782.3)  Impression & Recommendations:  Problem # 1:  HYPERTENSION (ICD-401.9) Assessment Deteriorated Her pressure should drop further as we decrease her volume overload. No change in medical therapy. Her updated medication list for this problem includes:    Aspirin Ec 325 Mg Tbec (Aspirin) .Marland Kitchen... Take one tablet by mouth daily    Clonidine Hcl 0.2 Mg Tabs (Clonidine hcl) .Marland Kitchen... 1 tab three times a day    Minoxidil 2.5 Mg Tabs (Minoxidil) .Marland Kitchen... 1 tab two times a day    Furosemide 40 Mg Tabs (Furosemide) .Marland Kitchen... 1 tab once daily as needed    Metoprolol Tartrate 100 Mg Tabs (Metoprolol tartrate) .Marland Kitchen... Take 1 and 1/2 tablets twice a day  Problem # 2:  EDEMA (ICD-782.3) Assessment: Improved  Will continue Lasix 80 mg the morning 40 mg in the evening. Check electrolytes today. She will continue with this dose and have her weight we'll draw another 3 or 4 pounds. She will need a followup in about a week to 10 days for blood pressure check.  Orders: TLB-BMP (Basic Metabolic Panel-BMET) ([id number]-[id number])  Patient Instructions: 1)  Your physician recommends that you schedule a follow-up appointment in: 6 MONTHS WITH DR Deysha Cartier 1 WEEK B/P CHECK 2)  Your physician recommends that you return for lab work [id number] 401.9 3)  Your physician recommends that you continue on your current medications as directed. Please refer to the Current Medication list given to you today.FUROSEMIDE 80 MG EVERY AM 4)  AND FUROSMIDE  40 MG EVERY PM

## 2010-02-13 NOTE — Medication Information (Signed)
Summary: Denied Protonix/[facility]  Denied Protonix/[facility]   Imported By: Lester Silver [place] 12/05/2009 10:06:39  _____________________________________________________________________  External Attachment:    Type:   Image     Comment:   External Document

## 2010-02-13 NOTE — Letter (Signed)
Summary: Discharge Summary  Discharge Summary   Imported By: Debby Freiberg 07/20/2009 11:45:11  _____________________________________________________________________  External Attachment:    Type:   Image     Comment:   External Document

## 2010-02-13 NOTE — Assessment & Plan Note (Signed)
Summary: eph/chest pain/lg   Visit Type:  EPH Primary Provider:  Blanca Friend .[place]  CC:  edema/abdomen at times...no other complaints today.  History of Present Illness: Ms Proffit returns today after being discharged the hospital with a non-ST segment elevation MI. She has a history of nonobstructive coronary disease by cardiac catheterization in March in [place]. She presented with several days of nausea and vomiting and had not been able to take any of her medications.  Her enzymes were fairly low. She had no chest discomfort or other ischemic symptoms in the hospital.  She's had a history of severe hypertension and recently received a renal transplant in [place]. I have none of those records. He is also diabetic.  She's had no further symptoms of angina or ischemia. She states be very compliant with her diet and her medications.  She is not on ACE inhibitor. I reviewed several of them with her today and she does not recall. She has a Systems analyst in [place] and she'll see this fall. Have asked her specifically asked that position about an ACE inhibitor. Her creatinine in the hospital was 1.0. She is still followed by the transplant team in [place].  Echocardiogram in the hospital showed EF of 55% with severe LVH.  Current Medications (verified): 1)  Aspirin Ec 325 Mg Tbec (Aspirin) .... Take One Tablet By Mouth Daily 2)  Simvastatin 40 Mg Tabs (Simvastatin) .Marland Kitchen.. 1 Tab At Bedtime 3)  Imuran 50 Mg Tabs (Azathioprine) .Marland Kitchen.. 1 Tab Two Times A Day 4)  Clonidine Hcl 0.2 Mg Tabs (Clonidine Hcl) .Marland Kitchen.. 1 Tab Three Times A Day 5)  Hydralazine Hcl 50 Mg Tabs (Hydralazine Hcl) .Marland Kitchen.. 1 Tab Two Times A Day 6)  Lantus 100 Unit/ml Soln (Insulin Glargine) .... 50 Units Once Daily 7)  Furosemide 40 Mg Tabs (Furosemide) .Marland Kitchen.. 1 Tab Once Daily As Needed 8)  Magnesium Oxide 250 Mg Tabs (Magnesium Oxide) .Marland Kitchen.. 1 Tab Two Times A Day 9)  Metoprolol Tartrate 100 Mg Tabs (Metoprolol Tartrate)  .Marland Kitchen.. 1 Tab Two Times A Day 10)  Novolog 100 Unit/ml Soln (Insulin Aspart) .... 40 Units Three Times A Day W/meals 11)  Potassium Chloride Crys Cr 20 Meq Cr-Tabs (Potassium Chloride Crys Cr) .Marland Kitchen.. 1 Tab Once Daily 12)  Prednisone 5 Mg Tabs (Prednisone) .Marland Kitchen.. 1 1/2 Tab Once Daily 13)  Prograf 1 Mg Caps (Tacrolimus) .... 6 Caps Once Daily 14)  Protonix 40 Mg Tbec (Pantoprazole Sodium) .Marland Kitchen.. 1 Tab Once Daily 15)  Zofran 4 Mg Tabs (Ondansetron Hcl) .Marland Kitchen.. 1 Tab As Needed  Allergies: 1)  ! Darvocet  Past History:  Past Medical History: Last updated: 07/14/2009 MYOCARDIAL INFARCTION, ACUTE, SUBENDOCARDIAL (ICD-410.70) CAD, NATIVE VESSEL (ICD-414.01) HYPERTENSION (ICD-401.9) NEPHROLITHIASIS, HX OF (ICD-V13.01) KIDNEY TRANSPLANTATION (ICD-V42.0) DIABETES MELLITUS, TYPE II (ICD-250.00) GERD (ICD-530.81) ASTHMA (ICD-493.90)    Past Surgical History: Last updated: 07/14/2009 Status post renal transplant Status post cardiac catheterization as well as  hysterectomy, cholecystectomy, kidney transplant and cataract surgery.      Family History: Last updated: 07/14/2009  No family history of kidney disease.      Social History: Last updated: 07/14/2009 The patient worked at a sewing factory.  She is   married.  She has 4 children.  She does not drink or smoke.      Review of Systems       negative other than history of present illness  Vital Signs:  Patient profile:   60 year old female Height:  65 inches Weight:      180 pounds BMI:     30.06 Pulse rate:   72 / minute Pulse rhythm:   irregular BP sitting:   150 / 90  (left arm) Cuff size:   large  Vitals Entered By: Danielle Rankin, CMA (August 04, 2009 4:44 PM)  Physical Exam  General:  obese.   Head:  normocephalic and atraumatic Eyes:  PERRLA/EOM intact; conjunctiva and lids normal. Neck:  Neck supple, no JVD. No masses, thyromegaly or abnormal cervical nodes. Chest Doreene Forrey:  no deformities or breast masses noted Lungs:   Clear bilaterally to auscultation and percussion. Heart:  PMI nondisplaced, normal S1-S2, no obvious gallop Msk:  Back normal, normal gait. Muscle strength and tone normal. Pulses:  pulses normal in all 4 extremities Extremities:  No clubbing or cyanosis. Neurologic:  Alert and oriented x 3. Skin:  Intact without lesions or rashes. Psych:  Normal affect.   EKG  Procedure date:  08/04/2009  Findings:      normal sinus rhythm, ST segment changes inferolaterally, LVH with strain  Impression & Recommendations:  Problem # 1:  MYOCARDIAL INFARCTION, ACUTE, SUBENDOCARDIAL (ICD-410.70) Assessment Unchanged  Her updated medication list for this problem includes:    Aspirin Ec 325 Mg Tbec (Aspirin) .Marland Kitchen... Take one tablet by mouth daily    Metoprolol Tartrate 100 Mg Tabs (Metoprolol tartrate) .Marland Kitchen... Take 1 and 1/2 tablets twice a day  Problem # 2:  CAD, NATIVE VESSEL (ICD-414.01) Assessment: Unchanged  Her updated medication list for this problem includes:    Aspirin Ec 325 Mg Tbec (Aspirin) .Marland Kitchen... Take one tablet by mouth daily    Metoprolol Tartrate 100 Mg Tabs (Metoprolol tartrate) .Marland Kitchen... Take 1 and 1/2 tablets twice a day  Problem # 3:  HYPERTENSION (ICD-401.9) She has always severe hypertension per her record. I have no outside records from [place]. With severe LVH on her echocardiogram, we will increase her hydralazine to 50 mg 3 times a day and her metoprolol 150 milligrams 2 times a day.  We'll have her return for blood pressure check next week. I've asked her to find out why she is not on ACE inhibitor when she goes back to see her diabetic doctor this fall. Salt restriction has been reinforced as well. Her updated medication list for this problem includes:    Aspirin Ec 325 Mg Tbec (Aspirin) .Marland Kitchen... Take one tablet by mouth daily    Clonidine Hcl 0.2 Mg Tabs (Clonidine hcl) .Marland Kitchen... 1 tab three times a day    Hydralazine Hcl 50 Mg Tabs (Hydralazine hcl) .Marland Kitchen... 1 tablet three times a  day    Furosemide 40 Mg Tabs (Furosemide) .Marland Kitchen... 1 tab once daily as needed    Metoprolol Tartrate 100 Mg Tabs (Metoprolol tartrate) .Marland Kitchen... Take 1 and 1/2 tablets twice a day  Problem # 4:  DIABETES MELLITUS, TYPE II (ICD-250.00)  Her updated medication list for this problem includes:    Aspirin Ec 325 Mg Tbec (Aspirin) .Marland Kitchen... Take one tablet by mouth daily    Lantus 100 Unit/ml Soln (Insulin glargine) .Marland KitchenMarland KitchenMarland KitchenMarland Kitchen 50 units once daily    Novolog 100 Unit/ml Soln (Insulin aspart) .Marland KitchenMarland KitchenMarland KitchenMarland Kitchen 40 units three times a day w/meals  Patient Instructions: 1)  Your physician recommends that you schedule a follow-up appointment in: 6 months with Dr. Daleen Squibb 2)  You have been referred to nurse room on August 14, 2009 10:00am 3)  for a blood pressure check 4)  Your physician has recommended you make the  following change in your medication: Increased Hydralazine to 50mg  three times a day.  Increased Metoprolol to 150mg  twice a day Prescriptions: HYDRALAZINE HCL 50 MG TABS (HYDRALAZINE HCL) 1 tablet three times a day  #90 x 11   Entered by:   Lisabeth Devoid RN   Authorized by:   Gaylord Shih, MD, [facility]   Signed by:   Lisabeth Devoid RN on 08/04/2009   Method used:   Electronically to        [facility] [phone]* (retail)       [address]       [place], [place]  [address]       Ph: [phone]       Fax: [phone]   RxID:   [id number] METOPROLOL TARTRATE 100 MG TABS (METOPROLOL TARTRATE) Take 1 and 1/2 tablets twice a day  #90 x 11   Entered by:   Lisabeth Devoid RN   Authorized by:   Gaylord Shih, MD, [facility]   Signed by:   Lisabeth Devoid RN on 08/04/2009   Method used:   Electronically to        [facility] [phone] (retail)       [address]       [place], [place]  [address]       Ph: [phone]       Fax: [phone]   RxID:   [id number]

## 2010-02-13 NOTE — Assessment & Plan Note (Signed)
Summary: NEW/ MEDICARE/ REG MEDICAID/NWS  #   Vital Signs:  Patient profile:   60 year old female Height:      65 inches Weight:      183.38 pounds BMI:     30.63 O2 Sat:      98 % on Room air Temp:     97.6 degrees F oral Pulse rate:   64 / minute BP sitting:   110 / 72  (left arm) Cuff size:   regular  Vitals Entered By: Zella Ball Ewing CMA Duncan Dull) (November 14, 2009 10:02 AM)  O2 Flow:  Room air  Preventive Care Screening  Colonoscopy:    Date:  02/14/2009    Results:  abnormal   Last Tetanus Booster:    Date:  11/14/2008    Results:  Historical   Last Pneumovax:    Date:  01/15/2008    Results:  given   Mammogram:    Date:  01/15/2008    Results:  normal   CC: New medicare/RE   Primary Care Provider:  Blanca Friend [place]  CC:  New medicare/RE.  History of Present Illness: here to establish as new pt ;  Pt denies CP, worsening sob, doe, wheezing, orthopnea, pnd, worsening LE edema, palps, dizziness or syncope  Not taking fluid pills since recent hosp;  volume seems Ok per pt now but is very worried about not having diuretic for the future.  Wt is acutally down several lbs since last visit.  Pt denies new neuro symptoms such as headache, facial or extremity weakness  Pt denies polydipsia, polyuria, or low sugar symptoms such as shakiness improved with eating.  Overall good compliance with meds, trying to follow low chol, DM diet, wt stable, little excercise however  No fever, wt loss, night sweats, loss of appetite or other constitutional symptoms Denies worsening depressive symptoms, suicidal ideation, or panic.  Of note echart review shows TSH 0.116   - oct 2011; as well as hgba1c 8.6 and lantus increased to 50 units;    Preventive Screening-Counseling & Management      Drug Use:  no.    Problems Prior to Update: 1)  Wheezing  (ICD-786.07) 2)  Thyroid Stimulating Hormone, Abnormal  (ICD-246.9) 3)  Diabetic Retinopathy  (ICD-250.50) 4)  Increased Intraocular  Pressure  (ICD-365.00) 5)  Peptic Ulcer Disease  (ICD-533.90) 6)  Colonic Polyps, Hx of  (ICD-V12.72) 7)  Transient Ischemic Attack, Hx of  (ICD-V12.50) 8)  Anemia-nos  (ICD-285.9) 9)  Hyperlipidemia  (ICD-272.4) 10)  Back Pain, Chronic  (ICD-724.5) 11)  Low Back Pain, Chronic  (ICD-724.2) 12)  Diastolic Dysfunction  (ICD-429.9) 13)  Myocardial Infarction, Hx of  (ICD-412) 14)  Coronary Artery Disease  (ICD-414.00) 15)  Gastroparesis  (ICD-536.3) 16)  Edema  (ICD-782.3) 17)  Myocardial Infarction, Acute, Subendocardial  (ICD-410.70) 18)  Cad, Native Vessel  (ICD-414.01) 19)  Hypertension  (ICD-401.9) 20)  Nephrolithiasis, Hx of  (ICD-V13.01) 21)  Kidney Transplantation  (ICD-V42.0) 22)  Diabetes Mellitus, Type II  (ICD-250.00) 23)  Gerd  (ICD-530.81) 24)  Asthma  (ICD-493.90)  Medications Prior to Update: 1)  Aspirin Ec 325 Mg Tbec (Aspirin) .... Take One Tablet By Mouth Daily 2)  Pravastatin Sodium 20 Mg Tabs (Pravastatin Sodium) .Marland Kitchen.. 1 Tab At Bedtime 3)  Imuran 50 Mg Tabs (Azathioprine) .Marland Kitchen.. 1 Tab Two Times A Day 4)  Clonidine Hcl 0.2 Mg Tabs (Clonidine Hcl) .Marland Kitchen.. 1 Tab Three Times A Day 5)  Minoxidil 2.5 Mg Tabs (Minoxidil) .Marland KitchenMarland KitchenMarland Kitchen  1 Tab Two Times A Day 6)  Lantus 100 Unit/ml Soln (Insulin Glargine) .... 50 Units Once Daily 7)  Furosemide 40 Mg Tabs (Furosemide) .... Take Two Tablets By Mouth Every Morning and One Tablet By Mouth Every Evening 8)  Magnesium Oxide 250 Mg Tabs (Magnesium Oxide) .Marland Kitchen.. 1 Tab Two Times A Day 9)  Metoprolol Tartrate 100 Mg Tabs (Metoprolol Tartrate) .... Take 1 and 1/2 Tablets Twice A Day 10)  Novolog 100 Unit/ml Soln (Insulin Aspart) .... 40 Units Three Times A Day W/meals 11)  Potassium Chloride Crys Cr 20 Meq Cr-Tabs (Potassium Chloride Crys Cr) .Marland Kitchen.. 1 Tab Once Daily 12)  Prednisone 10 Mg Tabs (Prednisone) .Marland Kitchen.. 1 Tab Once Daily 13)  Prograf 1 Mg Caps (Tacrolimus) .... 6 Caps Once Daily 14)  Protonix 40 Mg Tbec (Pantoprazole Sodium) .Marland Kitchen.. 1 Tab Once  Daily 15)  Zofran 4 Mg Tabs (Ondansetron Hcl) .Marland Kitchen.. 1 Tab As Needed  Current Medications (verified): 1)  Aspirin Ec 325 Mg Tbec (Aspirin) .... Take One Tablet By Mouth Daily 2)  Pravastatin Sodium 20 Mg Tabs (Pravastatin Sodium) .Marland Kitchen.. 1 Tab At Bedtime 3)  Imuran 50 Mg Tabs (Azathioprine) .Marland Kitchen.. 1 Tab Two Times A Day 4)  Clonidine Hcl 0.2 Mg Tabs (Clonidine Hcl) .Marland Kitchen.. 1 Tab Three Times A Day 5)  Minoxidil 2.5 Mg Tabs (Minoxidil) .Marland Kitchen.. 1 Tab Two Times A Day 6)  Lantus 100 Unit/ml Soln (Insulin Glargine) .... 50 Units Once Daily 7)  Furosemide 40 Mg Tabs (Furosemide) .... Take Two Tablets By Mouth Every Morning and One Tablet By Mouth Every Evening 8)  Magnesium Oxide 250 Mg Tabs (Magnesium Oxide) .Marland Kitchen.. 1 Tab Two Times A Day 9)  Metoprolol Tartrate 100 Mg Tabs (Metoprolol Tartrate) .... Take 1 and 1/2 Tablets Twice A Day 10)  Novolog 100 Unit/ml Soln (Insulin Aspart) .... 40 Units Three Times A Day W/meals 11)  Potassium Chloride Crys Cr 20 Meq Cr-Tabs (Potassium Chloride Crys Cr) .Marland Kitchen.. 1 Tab Once Daily 12)  Prednisone 10 Mg Tabs (Prednisone) .Marland Kitchen.. 1 Tab Once Daily 13)  Prograf 1 Mg Caps (Tacrolimus) .... 6 Caps Once Daily 14)  Protonix 40 Mg Tbec (Pantoprazole Sodium) .Marland Kitchen.. 1 Tab Once Daily 15)  Zofran 4 Mg Tabs (Ondansetron Hcl) .Marland Kitchen.. 1 Tab As Needed 16)  Multivitamins  Tabs (Multiple Vitamin) .Marland Kitchen.. 1 By Mouth Once Daily 17)  Miralax  Powd (Polyethylene Glycol 3350) .Marland Kitchen.. 1 Scoop Daily 18)  Docusate Sodium 100 Mg Caps (Docusate Sodium) .Marland Kitchen.. 1 By Mouth Two Times A Day At Bedtime 19)  Combigan 0.2-0.5 % Soln (Brimonidine Tartrate-Timolol) .Marland Kitchen.. 1 Drop Both Eyes Two Times A Day 20)  Isosorbide Mononitrate Cr 60 Mg Xr24h-Tab (Isosorbide Mononitrate) .Marland Kitchen.. 1po Once Daily 21)  Nitrostat 0.4 Mg Subl (Nitroglycerin) .... Use Asd 22)  Norvasc 10 Mg Tabs (Amlodipine Besylate) .Marland Kitchen.. 1 By Mouth Once Daily  Allergies (verified): 1)  ! Darvocet 2)  ! Keflex  Past History:  Family History: Last updated:  11/14/2009  No family history of kidney disease.   + premature CAD parent with Arthritis, heart disease, HTN, DM grandparent with DM several in family with thyroid disease  Social History: Last updated: 11/14/2009 The patient worked at a sewing factory.  She is   married.  She has 4 children.  She does not drink or smoke.  Drug use-no work:  disabled/ SSI since 2002 moved to [place] in June 2011  Past Medical History: MYOCARDIAL INFARCTION, ACUTE, SUBENDOCARDIAL (ICD-410.70) CAD, NATIVE VESSEL (ICD-414.01) HYPERTENSION (ICD-401.9) NEPHROLITHIASIS,  HX OF (ICD-V13.01) KIDNEY TRANSPLANTATION (ICD-V42.0) DIABETES MELLITUS, TYPE II (ICD-250.00) GERD (ICD-530.81) ASTHMA (ICD-493.90) MD roster:  card:  Dr Daleen Squibb                    renal: [facility] - Dr Ervin Knack                     GI:  [facility]  - Dr patel gastroparesis ?? 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  Diabetic retinopathy  Past Surgical History: Status post renal transplant Status post cardiac catheterization   Hysterectomy Cholecystectomy Cataract extraction s/p intestinal perforation surgury jan 2011 s/p breast biopsy 2010  Family History: Reviewed history from 07/14/2009 and no changes required.  No family history of kidney disease.   + premature CAD parent with Arthritis, heart disease, HTN, DM grandparent with DM several in family with thyroid disease  Social History: Reviewed history from 07/14/2009 and no changes required. The patient worked at a Product/process development scientist.  She is   married.  She has 4 children.  She does not drink or smoke.  Drug use-no work:  disabled/ SSI since 2002 moved to [place] in June  2011Drug Use:  no  Review of Systems       all otherwise negative per pt -    Physical Exam  General:  alert and overweight-appearing.   Head:  normocephalic and atraumatic Eyes:  vision grossly intact, pupils equal, and pupils round.   Ears:  R ear normal and L ear normal.   Nose:  no external deformity and no nasal discharge.   Mouth:  no gingival abnormalities and pharynx pink and moist.   Neck:  supple and no JVD.   Lungs:  Clear bilaterally to auscultation and percussion. Heart:  normal rate and regular rhythm.   Abdomen:  soft, non-tender, and normal bowel sounds.   Msk:  no joint tenderness and no joint swelling.   Extremities:  no edema, no erythema  Skin:  no rashes.   Psych:  slightly anxious.     Impression & Recommendations:  Problem # 1:  THYROID STIMULATING HORMONE, ABNORMAL (ICD-246.9) ? sick euthyroid - for TSH f/u today Orders: TLB-TSH (Thyroid Stimulating Hormone) (84443-TSH)  Problem # 2:  DIASTOLIC DYSFUNCTION (ICD-429.9) pt had been on lasix 40mg /K supp no longer taking since hospn.  pt states wheezing but exam without volume overload - for cxr and bnp, but hold on dieuetic/K at this time Orders: TLB-BNP (B-Natriuretic Peptide) (83880-BNPR)  Problem # 3:  HYPERTENSION (ICD-401.9)  Her updated medication list for this problem includes:    Clonidine Hcl 0.2 Mg Tabs (Clonidine hcl) .Marland Kitchen... 1 tab three times a day    Minoxidil 2.5 Mg Tabs (Minoxidil) .Marland Kitchen... 1 tab two times a day    Furosemide 40 Mg Tabs (Furosemide) .Marland Kitchen... Take two tablets by mouth every morning and one tablet by mouth every evening    Metoprolol Tartrate 100 Mg Tabs (Metoprolol tartrate) .Marland Kitchen... Take 1 and 1/2 tablets twice a day    Norvasc 10 Mg Tabs (Amlodipine besylate) .Marland Kitchen... 1 by mouth once daily  BP today: 110/72 Prior BP: 132/82 (10/10/2009)  Labs Reviewed: K+: 4.5 (10/03/2009) Creat: : 1.1 (10/03/2009)    stable  overall by hx and exam, ok to continue meds/tx as is   Problem # 4:   HYPERLIPIDEMIA (ICD-272.4)  Her updated medication list for this problem includes:    Pravastatin Sodium 20 Mg Tabs (Pravastatin sodium) .Marland Kitchen... 1 tab at bedtime with good  compliacne since feb - to check labs - Pt to continue diet efforts, good med tolerance; to check labs - goal LDL less than 70   Orders: TLB-Lipid Panel (80061-LIPID)  Problem # 5:  ANEMIA-NOS (ICD-285.9) for f/u labs today Orders: TLB-IBC Pnl (Iron/FE;Transferrin) (83550-IBC) TLB-B12 + Folate Pnl (16109_60454-U98/JXB) TLB-CBC Platelet - w/Differential (85025-CBCD)  Problem # 6:  KIDNEY TRANSPLANTATION (ICD-V42.0)  to refer nephrology, f/u labs today  Orders: Nephrology Referral (Nephro) TLB-BMP (Basic Metabolic Panel-BMET) (80048-METABOL)  Complete Medication List: 1)  Aspirin Ec 325 Mg Tbec (Aspirin) .... Take one tablet by mouth daily 2)  Pravastatin Sodium 20 Mg Tabs (Pravastatin sodium) .Marland Kitchen.. 1 tab at bedtime 3)  Imuran 50 Mg Tabs (Azathioprine) .Marland Kitchen.. 1 tab two times a day 4)  Clonidine Hcl 0.2 Mg Tabs (Clonidine hcl) .Marland Kitchen.. 1 tab three times a day 5)  Minoxidil 2.5 Mg Tabs (Minoxidil) .Marland Kitchen.. 1 tab two times a day 6)  Lantus 100 Unit/ml Soln (Insulin glargine) .... 50 units once daily 7)  Furosemide 40 Mg Tabs (Furosemide) .... Take two tablets by mouth every morning and one tablet by mouth every evening 8)  Magnesium Oxide 250 Mg Tabs (Magnesium oxide) .Marland Kitchen.. 1 tab two times a day 9)  Metoprolol Tartrate 100 Mg Tabs (Metoprolol tartrate) .... Take 1 and 1/2 tablets twice a day 10)  Novolog 100 Unit/ml Soln (Insulin aspart) .... 40 units three times a day w/meals 11)  Potassium Chloride Crys Cr 20 Meq Cr-tabs (Potassium chloride crys cr) .Marland Kitchen.. 1 tab once daily 12)  Prednisone 10 Mg Tabs (Prednisone) .Marland Kitchen.. 1 tab once daily 13)  Prograf 1 Mg Caps (Tacrolimus) .... 6 caps once daily 14)  Protonix 40 Mg Tbec (Pantoprazole sodium) .Marland Kitchen.. 1 tab once daily 15)  Zofran 4 Mg Tabs (Ondansetron hcl) .Marland Kitchen.. 1 tab as needed 16)   Multivitamins Tabs (Multiple vitamin) .Marland Kitchen.. 1 by mouth once daily 17)  Miralax Powd (Polyethylene glycol 3350) .Marland Kitchen.. 1 scoop daily 18)  Docusate Sodium 100 Mg Caps (Docusate sodium) .Marland Kitchen.. 1 by mouth two times a day at bedtime 19)  Combigan 0.2-0.5 % Soln (Brimonidine tartrate-timolol) .Marland Kitchen.. 1 drop both eyes two times a day 20)  Isosorbide Mononitrate Cr 60 Mg Xr24h-tab (Isosorbide mononitrate) .Marland Kitchen.. 1po once daily 21)  Nitrostat 0.4 Mg Subl (Nitroglycerin) .... Use asd 22)  Norvasc 10 Mg Tabs (Amlodipine besylate) .Marland Kitchen.. 1 by mouth once daily  Other Orders: T-2 View CXR, Same Day (71020.5TC) TLB-A1C / Hgb A1C (Glycohemoglobin) (83036-A1C)  Patient Instructions: 1)  please call for your yearly mammogram - consider [facility] on [address], or [facility] on [address] 2)  Continue all previous medications as before this visit  3)  Please go to the Lab in the basement for your blood and/or urine tests today 4)  Please go to Radiology in the basement level for your X-Ray today  5)  Please call the number on the [facility] Card for results of your testing  6)  You will be contacted about the referral(s) to: Renal 7)  Please schedule a follow-up appointment in 4 months.   Orders Added: 1)  Nephrology Referral [Nephro] 2)  T-2 View CXR, Same Day [71020.5TC] 3)  TLB-TSH (Thyroid Stimulating Hormone) [84443-TSH] 4)  TLB-BNP (B-Natriuretic Peptide) [83880-BNPR] 5)  TLB-BMP (Basic Metabolic Panel-BMET) [80048-METABOL] 6)  TLB-IBC Pnl (Iron/FE;Transferrin) [83550-IBC] 7)  TLB-B12 + Folate Pnl [82746_82607-B12/FOL] 8)  TLB-CBC Platelet - w/Differential [85025-CBCD] 9)  TLB-A1C / Hgb A1C (Glycohemoglobin) [83036-A1C] 10)  TLB-Lipid Panel [80061-LIPID] 11)  New Patient Level IV [16109]   Immunization History:  Tetanus/Td Immunization History:    Tetanus/Td:  historical (11/14/2008)   Immunization History:  Tetanus/Td Immunization History:    Tetanus/Td:  Historical (11/14/2008)

## 2010-02-13 NOTE — Progress Notes (Signed)
  Phone Note Outgoing Call   Call placed by: Lisabeth Devoid RN,  November 03, 2009 5:17 PM Call placed to: Patient Summary of Call: post hospital labs  Follow-up for Phone Call        Daughter, Neysa Bonito, is concerned that her mother has not had lab work post hospital.  She was told at discharge to have her blood level checked within a week at the office since the lasix and potassium were discontinued.  Her mother does not have sob, or increased edema. She thought it was a BNP. I will forward to Dr. Daleen Squibb for review. She will await call back. Mylo Red RN     Appended Document:  no need for blood work per hospital discharge summary.  Appended Document:  Daugter, Neysa Bonito, is aware. Mylo Red RN

## 2010-02-13 NOTE — Letter (Signed)
Summary: [facility]  [facility]   Imported By: Lennie Odor 09/19/2009 14:37:31  _____________________________________________________________________  External Attachment:    Type:   Image     Comment:   External Document

## 2010-02-13 NOTE — Medication Information (Signed)
Summary: [facility]  [facility]   Imported By: Lester [place] 12/08/2009 10:50:15  _____________________________________________________________________  External Attachment:    Type:   Image     Comment:   External Document

## 2010-02-13 NOTE — Progress Notes (Signed)
Summary: discuss lab work  Phone Note Call from Patient Call back at [employer] [phone]   Caller: Daughter- Neysa Bonito  [phone] Reason for Call: Talk to Nurse, Lab or Test Results Details for Reason: discuss blood work Initial call taken by: Lorne Skeens,  September 29, 2009 10:14 AM  Follow-up for Phone Call        Daughter Neysa Bonito aware of lab results.  Reports swelling in legs is reduced, no shortness of breath.  Still has some swelling in face both before and after oral surgery as well as abdomen.  Her weight maintains at 190 today.  She will follow-up on Tuesday with Dr. Daleen Squibb.   Mylo Red RN     Appended Document: discuss lab work  Reviewed Juanito Doom, MD

## 2010-02-13 NOTE — Progress Notes (Signed)
Summary: REFILLS/LETTER  Phone Note Call from Patient Call back at Home Phone [phone]   Caller: Patient Reason for Call: Talk to Nurse Summary of Call: NEEDS RX FOR NEEDLES, POLYETHYLENE GLYCOL 255 GRAMS AND EQ STOOL SOFTENER 100MG  AND PROTONIX 40MG , NEEDS LETTER STATING THAT SHE IS ON PROTONIX SENT TO INSURANCE COMPANY... [facility] FAX [phone] Initial call taken by: Migdalia Dk,  November 27, 2009 11:34 AM  Follow-up for Phone Call        ok for all - to robin to help Follow-up by: Corwin Levins MD,  November 27, 2009 4:49 PM    Prescriptions: NOVOLOG FLEXPEN 100 UNIT/ML SOLN (INSULIN ASPART) 40 units three times a day  #3 month x 3   Entered by:   Scharlene Gloss CMA (AAMA)   Authorized by:   Corwin Levins MD   Signed by:   Scharlene Gloss CMA (AAMA) on 11/28/2009   Method used:   Faxed to ...       [facility] [address] [phone]* (retail)       [address]       [place], [place]  [address]       Ph: [phone]       Fax: [phone]   RxID:   [id number] DOCUSATE SODIUM 100 MG CAPS (DOCUSATE SODIUM) 1 by mouth two times a day at bedtime  #60 x 2   Entered by:   Scharlene Gloss CMA (AAMA)   Authorized by:   Corwin Levins MD   Signed by:   Scharlene Gloss CMA (AAMA) on 11/28/2009   Method used:   Faxed to ...       [facility] [address] [phone]* (retail)       [address]       [place], [place]  [address]       Ph: [phone]       Fax: [phone]   RxID:   [id number] MIRALAX  POWD (POLYETHYLENE GLYCOL 3350) 1 scoop daily  #1 x 6   Entered by:   Zella Ball Ewing CMA (AAMA)   Authorized by:   Corwin Levins MD   Signed by:   Scharlene Gloss CMA (AAMA) on 11/28/2009   Method used:   Faxed to ...       [facility] [address] [phone]* (retail)       [address]       [place], [place]  [address]       Ph: [phone]       Fax: [phone]   RxID:   [id number] PROTONIX 40 MG TBEC (PANTOPRAZOLE SODIUM) 1 tab once daily  #90 x 3   Entered by:   Scharlene Gloss CMA  (AAMA)   Authorized by:   Corwin Levins MD   Signed by:   Scharlene Gloss CMA (AAMA) on 11/28/2009   Method used:   Faxed to ...       [facility] [address] [phone]* (retail)       [address]       [place], [place]  [address]       Ph: [phone]       Fax: [phone]   RxID:   [id number]

## 2010-02-15 NOTE — Letter (Signed)
Summary: [employer]ifying [employer]  [employer]ifying [employer]   Imported By: Roderic Ovens 01/04/2010 16:05:04  _____________________________________________________________________  External Attachment:    Type:   Image     Comment:   External Document

## 2010-02-15 NOTE — Progress Notes (Signed)
Summary: referral status  Phone Note Call from Patient Call back at Home Phone [phone]   Caller: Patient Summary of Call: Pt called to check status kidney referral, please call pt to advise Initial call taken by: Margaret Pyle, CMA,  December 13, 2009 11:43 AM  Follow-up for Phone Call        pt states her phone was out of service and i informe pt of her referral stus and provided her the phone to [facility] Follow-up by: Shelbie Proctor,  December 29, 2009 10:01 AM

## 2010-02-15 NOTE — Miscellaneous (Addendum)
  Clinical Lists Changes  Problems: Added new problem of GOITER, UNSPECIFIED (ICD-240.9) Orders: Added new Referral order of Radiology Referral (Radiology) - Signed  Appended Document:  robin to call pt- I have orderd thyroid u/s to further look into her enlarged thyroid as rec'd by renal MD  Appended Document:  called pt. informed of above information  Appended Document:  [facility] called to confirm to the patient that the thyroid US was ordered by Dr. Jonny Ruiz as patient was seen there today and confused as to who ordered the [place]. I called the patient and confirmed with her that Dr. Jonny Ruiz did order the [place].

## 2010-02-16 ENCOUNTER — Inpatient Hospital Stay: Admission: RE | Admit: 2010-02-16 | Payer: Self-pay | Source: Ambulatory Visit

## 2010-02-21 NOTE — Letter (Signed)
Summary: [facility]  [facility]   Imported By: Lester [place] 02/15/2010 07:32:13  _____________________________________________________________________  External Attachment:    Type:   Image     Comment:   External Document

## 2010-02-21 NOTE — Letter (Signed)
Summary: [facility]  [facility]   Imported By: Lester [place] 02/15/2010 07:28:50  _____________________________________________________________________  External Attachment:    Type:   Image     Comment:   External Document

## 2010-02-28 ENCOUNTER — Encounter: Payer: Self-pay | Admitting: Cardiology

## 2010-02-28 ENCOUNTER — Ambulatory Visit (INDEPENDENT_AMBULATORY_CARE_PROVIDER_SITE_OTHER): Payer: Medicare Other | Admitting: Cardiology

## 2010-02-28 DIAGNOSIS — I5032 Chronic diastolic (congestive) heart failure: Secondary | ICD-10-CM

## 2010-02-28 DIAGNOSIS — I2510 Atherosclerotic heart disease of native coronary artery without angina pectoris: Secondary | ICD-10-CM

## 2010-03-01 ENCOUNTER — Other Ambulatory Visit: Payer: Self-pay | Admitting: Nephrology

## 2010-03-01 DIAGNOSIS — Z1231 Encounter for screening mammogram for malignant neoplasm of breast: Secondary | ICD-10-CM

## 2010-03-07 NOTE — Assessment & Plan Note (Signed)
Summary: per check out 11/14/lg/ep rs from [facility] gd/ep   Visit Type:  Follow-up Primary Provider:  Blanca Friend .[place]   History of Present Illness: Monica Lee returns for pos hospital followup. She was admitted with hypertensive crisis. Troponins were elevated and felt to be secondary to poor control of BP. Echo demonstrated severe LVH with normal systolic function, EF 55%.  She is very good about checking daily weight, bps, and blood sugars. She brings in log today. BP has been better, edema stable,yet weight is up 3 lbs .  Current Medications (verified): 1)  Aspirin Ec 325 Mg Tbec (Aspirin) .... Take One Tablet By Mouth Daily 2)  Pravastatin Sodium 20 Mg Tabs (Pravastatin Sodium) .... 1/2 Tab At Bedtime 3)  Imuran 50 Mg Tabs (Azathioprine) .Marland Kitchen.. 1 Tab Two Times A Day 4)  Clonidine Hcl 0.2 Mg Tabs (Clonidine Hcl) .Marland Kitchen.. 12  Tab Three Times A Day 5)  Minoxidil 2.5 Mg Tabs (Minoxidil) .Marland Kitchen.. 1 Tab Two Times A Day 6)  Lantus 100 Unit/ml Soln (Insulin Glargine) .... 60 Units Once Daily 7)  Magnesium Oxide 250 Mg Tabs (Magnesium Oxide) .Marland Kitchen.. 1 Tab Two Times A Day 8)  Metoprolol Tartrate 100 Mg Tabs (Metoprolol Tartrate) .... Take 1 and 1/2 Tablets Twice A Day 9)  Novolog 100 Unit/ml Soln (Insulin Aspart) .... 40 Units Three Times A Day W/meals 10)  Prednisone 10 Mg Tabs (Prednisone) .Marland Kitchen.. 1 Tab Once Daily 11)  Prograf 1 Mg Caps (Tacrolimus) .... 7 Am 6 Pm Caps Once Daily 12)  Nexium 40 Mg Cpdr (Esomeprazole Magnesium) .Marland Kitchen.. 1 Tab By Mouth Once Daily 13)  Multivitamins  Tabs (Multiple Vitamin) .Marland Kitchen.. 1 By Mouth Once Daily 14)  Miralax  Powd (Polyethylene Glycol 3350) .Marland Kitchen.. 1 Scoop Daily 15)  Docusate Sodium 100 Mg Caps (Docusate Sodium) .Marland Kitchen.. 1 By Mouth Two Times A Day At Bedtime 16)  Combigan 0.2-0.5 % Soln (Brimonidine Tartrate-Timolol) .Marland Kitchen.. 1 Drop Both Eyes Two Times A Day 17)  Isosorbide Mononitrate Cr 60 Mg Xr24h-Tab (Isosorbide Mononitrate) .Marland Kitchen.. 1po Once Daily 18)  Nitrostat 0.4 Mg Subl  (Nitroglycerin) .... Use Asd 19)  Norvasc 10 Mg Tabs (Amlodipine Besylate) .Marland Kitchen.. 1 By Mouth Once Daily 20)  Novolog Flexpen 100 Unit/ml Soln (Insulin Aspart) .... 40 Units Three Times A Day 21)  Furosemide 40 Mg Tabs (Furosemide) .... 2 in The Morning and 1 in The Afternoon 22)  Potassium Chloride Crys Cr 20 Meq Cr-Tabs (Potassium Chloride Crys Cr) .... Take One Tablet By Mouth Daily  Allergies (verified): 1)  ! Darvocet 2)  ! Keflex  Past History:  Past Medical History: Last updated: 11/14/2009 MYOCARDIAL INFARCTION, ACUTE, SUBENDOCARDIAL (ICD-410.70) CAD, NATIVE VESSEL (ICD-414.01) HYPERTENSION (ICD-401.9) NEPHROLITHIASIS, HX OF (ICD-V13.01) KIDNEY TRANSPLANTATION (ICD-V42.0) DIABETES MELLITUS, TYPE II (ICD-250.00) GERD (ICD-530.81) ASTHMA (ICD-493.90) MD roster:  card:  Dr Daleen Squibb                    renal: [facility] - Dr Ervin Knack                     GI:  [facility]  - Dr patel gastroparesis ?? 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  Diabetic retinopathy  Past Surgical History: Last updated:  11/14/2009 Status post renal transplant Status post cardiac catheterization   Hysterectomy Cholecystectomy Cataract extraction s/p intestinal perforation surgury jan 2011 s/p breast biopsy 2010  Family History: Last updated: 11/14/2009  No family history of kidney disease.   + premature CAD parent with Arthritis, heart disease, HTN, DM grandparent with DM several in family with thyroid disease  Social History: Last updated: 11/14/2009 The patient worked at a sewing factory.  She is   married.  She has 4 children.  She does not drink or smoke.  Drug use-no work:  disabled/ SSI  since 2002 moved to [place] in June 2011  Review of Systems       negative other than  HPI  Vital Signs:  Patient profile:   60 year old female Height:      65 inches Weight:      196.25 pounds BMI:     32.78 Pulse rate:   75 / minute Pulse rhythm:   regular BP sitting:   148 / 82  (left arm) Cuff size:   large  Vitals Entered By: Celestia Khat, CMA (February 28, 2010 10:02 AM)  Physical Exam  General:  obese.   Eyes:  PERRLA/EOM intact; conjunctiva and lids normal. Ears:  TM's intact and clear with normal canals and hearing Neck:  Neck supple, no JVD. No masses, thyromegaly or abnormal cervical nodes. Chest Curlie Sittner:  no deformities or breast masses noted Lungs:  Clear bilaterally to auscultation and percussion. Heart:  RRR, NlS1 AND S2, Systolic murmur, no carotid bruits Msk:  decreased ROM.   Pulses:  pulses normal in all 4 extremities Extremities:  1+ left pedal edema and 1+ right pedal edema.   Neurologic:  Alert and oriented x 3. Skin:  Intact without lesions or rashes. Psych:  Normal affect.   Impression & Recommendations:  Problem # 1:  CHRONIC DIASTOLIC HEART FAILURE (ICD-428.32) Assessment Deteriorated Pt taught to titrate furoseamide per daily weights. Blood pressure controlled emphasized, salt restriction. Her updated medication list for this problem includes:    Aspirin Ec 325 Mg Tbec (Aspirin) .Marland Kitchen... Take one tablet by mouth daily    Metoprolol Tartrate 100 Mg Tabs (Metoprolol tartrate) .Marland Kitchen... Take 1 and 1/2 tablets twice a day    Isosorbide Mononitrate Cr 60 Mg Xr24h-tab (Isosorbide mononitrate) .Marland Kitchen... 1po once daily    Nitrostat 0.4 Mg Subl (Nitroglycerin) ..... Use asd    Norvasc 10 Mg Tabs (Amlodipine besylate) .Marland Kitchen... 1 by mouth once daily    Furosemide 40 Mg Tabs (Furosemide) .Marland Kitchen... 2 in the morning and 1 in the afternoon. you may take an extra tablet daily for fluid weight gain of 3 pounds  Problem # 2:  CORONARY ARTERY DISEASE (ICD-414.00) Assessment:  Unchanged  Her updated medication list for this problem includes:    Aspirin Ec 325 Mg Tbec (Aspirin) .Marland Kitchen... Take one tablet by mouth daily    Metoprolol Tartrate 100 Mg Tabs (Metoprolol tartrate) .Marland Kitchen... Take 1 and 1/2 tablets twice a day    Isosorbide Mononitrate Cr 60 Mg Xr24h-tab (Isosorbide mononitrate) .Marland Kitchen... 1po once daily    Nitrostat 0.4 Mg Subl (Nitroglycerin) ..... Use asd    Norvasc 10 Mg Tabs (Amlodipine besylate) .Marland Kitchen... 1 by mouth once daily  Problem # 3:  MYOCARDIAL INFARCTION, HX OF (ICD-412) Assessment: Unchanged  Her updated medication list for this problem includes:    Aspirin Ec 325 Mg Tbec (Aspirin) .Marland Kitchen... Take one tablet by mouth daily    Metoprolol Tartrate 100 Mg Tabs (Metoprolol tartrate) .Marland KitchenMarland KitchenMarland KitchenMarland Kitchen  Take 1 and 1/2 tablets twice a day    Isosorbide Mononitrate Cr 60 Mg Xr24h-tab (Isosorbide mononitrate) .Marland Kitchen... 1po once daily    Nitrostat 0.4 Mg Subl (Nitroglycerin) ..... Use asd    Norvasc 10 Mg Tabs (Amlodipine besylate) .Marland Kitchen... 1 by mouth once daily  Other Orders: EKG w/ Interpretation (93000)  Patient Instructions: 1)  Your physician recommends that you schedule a follow-up appointment in: 6 months with Dr. Daleen Squibb 2)  Your physician has recommended you make the following change in your medication:  Prescriptions: POTASSIUM CHLORIDE CRYS CR 20 MEQ CR-TABS (POTASSIUM CHLORIDE CRYS CR) one tablet by mouth daily. Take an extra tablet for fluid weight gain of 3 pounds or more daily as needed  #60 x 6   Entered by:   Lisabeth Devoid RN   Authorized by:   Gaylord Shih, MD, [facility]   Signed by:   Lisabeth Devoid RN on 02/28/2010   Method used:   Electronically to        [facility] [phone]* (retail)       [address]       [place], [place]  [address]       Ph: [phone]       Fax: [phone]   RxID:   [id number]

## 2010-03-13 ENCOUNTER — Ambulatory Visit
Admission: RE | Admit: 2010-03-13 | Discharge: 2010-03-13 | Disposition: A | Discharge status: Home / Self Care | Payer: Medicare Other | Source: Ambulatory Visit | Attending: Nephrology | Admitting: Nephrology

## 2010-03-13 DIAGNOSIS — Z1231 Encounter for screening mammogram for malignant neoplasm of breast: Secondary | ICD-10-CM

## 2010-03-20 ENCOUNTER — Encounter: Payer: Self-pay | Admitting: Internal Medicine

## 2010-03-21 ENCOUNTER — Ambulatory Visit (INDEPENDENT_AMBULATORY_CARE_PROVIDER_SITE_OTHER): Payer: Medicare Other | Admitting: Internal Medicine

## 2010-03-21 ENCOUNTER — Encounter: Payer: Self-pay | Admitting: Internal Medicine

## 2010-03-21 ENCOUNTER — Telehealth: Payer: Self-pay | Admitting: Cardiology

## 2010-03-21 DIAGNOSIS — E785 Hyperlipidemia, unspecified: Secondary | ICD-10-CM

## 2010-03-21 DIAGNOSIS — I10 Essential (primary) hypertension: Secondary | ICD-10-CM

## 2010-03-21 DIAGNOSIS — E119 Type 2 diabetes mellitus without complications: Secondary | ICD-10-CM

## 2010-03-21 DIAGNOSIS — I2510 Atherosclerotic heart disease of native coronary artery without angina pectoris: Secondary | ICD-10-CM

## 2010-03-27 LAB — POCT CARDIAC MARKERS
CKMB, poc: 2.2 ng/mL (ref 1.0–8.0)
Myoglobin, poc: 57.4 ng/mL (ref 12–200)
Troponin i, poc: <0.05 ng/mL (ref 0.00–0.09)

## 2010-03-27 LAB — BASIC METABOLIC PANEL
BUN: 19 mg/dL (ref 6–23)
CO2: 27 mEq/L (ref 19–32)
Calcium: 9.4 mg/dL (ref 8.4–10.5)
Chloride: 106 mEq/L (ref 96–112)
Creatinine, Ser: 0.94 mg/dL (ref 0.4–1.2)
GFR calc Af Amer: >60 mL/min (ref >60)
GFR calc non Af Amer: >60 mL/min (ref >60)
Glucose, Bld: 171 mg/dL — ABNORMAL HIGH (ref 70–99)
Potassium: 4 mEq/L (ref 3.5–5.1)
Sodium: 138 mEq/L (ref 135–145)

## 2010-03-27 LAB — CBC
HCT: 38.1 % (ref 36.0–46.0)
Hemoglobin: 12.2 g/dL (ref 12.0–15.0)
MCH: 27.5 pg (ref 26.0–34.0)
MCHC: 32 g/dL (ref 30.0–36.0)
MCV: 85.8 fL (ref 78.0–100.0)
Platelets: 238 10*3/uL (ref 150–400)
RBC: 4.44 MIL/uL (ref 3.87–5.11)
RDW: 14.7 % (ref 11.5–15.5)
WBC: 5.4 10*3/uL (ref 4.0–10.5)

## 2010-03-27 LAB — DIFFERENTIAL
Basophils Absolute: 0 10*3/uL (ref 0.0–0.1)
Basophils Relative: 0 % (ref 0–1)
Eosinophils Absolute: 0 10*3/uL (ref 0.0–0.7)
Eosinophils Relative: 1 % (ref 0–5)
Lymphocytes Relative: 19 % (ref 12–46)
Lymphs Abs: 1 10*3/uL (ref 0.7–4.0)
Monocytes Absolute: 0.6 10*3/uL (ref 0.1–1.0)
Monocytes Relative: 11 % (ref 3–12)
Neutro Abs: 3.8 10*3/uL (ref 1.7–7.7)
Neutrophils Relative %: 69 % (ref 43–77)

## 2010-03-27 LAB — BRAIN NATRIURETIC PEPTIDE: Pro B Natriuretic peptide (BNP): 352 pg/mL — ABNORMAL HIGH (ref 0.0–100.0)

## 2010-03-27 NOTE — Progress Notes (Signed)
Summary: Letter request  Phone Note From Other Clinic   Caller: Taisha w/Dr Cooper's office Call For: Letter needed ASAP Summary of Call: Pt is at Dr Cooper's office now for a deep cleaning. Dr Earmon Phoenix office is requesting a letter to be faxed over stating that they have clearance to move forward w/the deep cleaning and to give clearance for anesthesia. Caller also request to have put in the letter if she needs any pre-meds. Fax [phone] Office [phone] Initial call taken by: Burnard Leigh [facility]),  March 21, 2010 11:17 AM  Follow-up for Phone Call        I defer to Dr Daleen Squibb who has seen her last Follow-up by: Corwin Levins MD,  March 21, 2010 12:22 PM  Additional Follow-up for Phone Call Additional follow up Details #1::        Cleared by me for procedure. Additional Follow-up by: Gaylord Shih, MD, [facility],  March 21, 2010 2:01 PM

## 2010-03-27 NOTE — Letter (Signed)
Summary: J. Hazeline Junker, DDS, PA  J. Hazeline Junker, DDS, PA   Imported By: Lennie Odor 03/23/2010 09:58:54  _____________________________________________________________________  External Attachment:    Type:   Image     Comment:   External Document

## 2010-03-27 NOTE — Assessment & Plan Note (Signed)
Summary: 4 MO ROV/NWS #   Vital Signs:  Patient profile:   60 year old female Height:      65 inches Weight:      193.50 pounds BMI:     32.32 O2 Sat:      98 % on Room air Temp:     98.4 degrees F oral Pulse rate:   77 / minute BP sitting:   140 / 82  (left arm) Cuff size:   regular  Vitals Entered By: Zella Ball Ewing CMA [facility]) (March 21, 2010 2:33 PM)  O2 Flow:  Room air CC: 4 month ROV/RE   Primary Care Provider:  Blanca Friend .[place]  CC:  4 month ROV/RE.  History of Present Illness: here to f/u - overall doing ok;  Pt denies CP, worsening sob, doe, wheezing, orthopnea, pnd, worsening LE edema, palps, dizziness or syncope  Pt denies new neuro symptoms such as headache, facial or extremity weakness  Pt denies polydipsia, polyuria, or low sugar symptoms such as shakiness improved with eating.  Overall good compliance with meds, trying to follow low chol, DM diet, wt stable, little excercise however  Actually taking lantus 50 units per day, sugars somewhat labile .  o/w Overall good compliance with meds, and good tolerability.  Denies worsening depressive symptoms, suicidal ideation, or panic.   No fever, wt loss, night sweats, loss of appetite or other constitutional symptoms  No other new complaints.  Needs a form filled out today, and several refills.    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) 5)  Diabetic Retinopathy  (ICD-250.50) 6)  Increased Intraocular Pressure  (ICD-365.00) 7)  Peptic Ulcer Disease  (ICD-533.90) 8)  Colonic Polyps, Hx of  (ICD-V12.72) 9)  Transient Ischemic Attack, Hx of  (ICD-V12.50) 10)  Anemia-nos  (ICD-285.9) 11)  Hyperlipidemia  (ICD-272.4) 12)  Back Pain, Chronic  (ICD-724.5) 13)  Low Back Pain, Chronic  (ICD-724.2) 14)  Diastolic Dysfunction  (ICD-429.9) 15)  Myocardial Infarction, Hx of   (ICD-412) 16)  Coronary Artery Disease  (ICD-414.00) 17)  Gastroparesis  (ICD-536.3) 18)  Edema  (ICD-782.3) 19)  Myocardial Infarction, Acute, Subendocardial  (ICD-410.70) 20)  Cad, Native Vessel  (ICD-414.01) 21)  Hypertension  (ICD-401.9) 22)  Nephrolithiasis, Hx of  (ICD-V13.01) 23)  Kidney Transplantation  (ICD-V42.0) 24)  Diabetes Mellitus, Type II  (ICD-250.00) 25)  Gerd  (ICD-530.81) 26)  Asthma  (ICD-493.90)  Medications Prior to Update: 1)  Aspirin Ec 325 Mg Tbec (Aspirin) .... Take One Tablet By Mouth Daily 2)  Pravastatin Sodium 20 Mg Tabs (Pravastatin Sodium) .... 1/2 Tab At Bedtime 3)  Imuran 50 Mg Tabs (Azathioprine) .Marland Kitchen.. 1 Tab Two Times A Day 4)  Clonidine Hcl 0.2 Mg Tabs (Clonidine Hcl) .Marland Kitchen.. 12  Tab Three Times A Day 5)  Minoxidil 2.5 Mg Tabs (Minoxidil) .Marland Kitchen.. 1 Tab Two Times A Day 6)  Lantus 100 Unit/ml Soln (Insulin Glargine) .... 60 Units Once Daily 7)  Magnesium Oxide 250 Mg Tabs (Magnesium Oxide) .Marland Kitchen.. 1 Tab Two Times A Day 8)  Metoprolol Tartrate 100 Mg Tabs (Metoprolol Tartrate) .... Take 1 and 1/2 Tablets Twice A Day 9)  Novolog 100 Unit/ml Soln (Insulin Aspart) .... 40 Units Three Times A Day W/meals 10)  Prednisone 10 Mg Tabs (Prednisone) .Marland Kitchen.. 1 Tab Once Daily 11)  Prograf 1 Mg Caps (Tacrolimus) .Marland KitchenMarland KitchenMarland Kitchen  7 Am 6 Pm Caps Once Daily 12)  Nexium 40 Mg Cpdr (Esomeprazole Magnesium) .Marland Kitchen.. 1 Tab By Mouth Once Daily 13)  Multivitamins  Tabs (Multiple Vitamin) .Marland Kitchen.. 1 By Mouth Once Daily 14)  Miralax  Powd (Polyethylene Glycol 3350) .Marland Kitchen.. 1 Scoop Daily 15)  Docusate Sodium 100 Mg Caps (Docusate Sodium) .Marland Kitchen.. 1 By Mouth Two Times A Day At Bedtime 16)  Combigan 0.2-0.5 % Soln (Brimonidine Tartrate-Timolol) .Marland Kitchen.. 1 Drop Both Eyes Two Times A Day 17)  Isosorbide Mononitrate Cr 60 Mg Xr24h-Tab (Isosorbide Mononitrate) .Marland Kitchen.. 1po Once Daily 18)  Nitrostat 0.4 Mg Subl (Nitroglycerin) .... Use Asd 19)  Norvasc 10 Mg Tabs (Amlodipine Besylate) .Marland Kitchen.. 1 By Mouth Once Daily 20)  Novolog  Flexpen 100 Unit/ml Soln (Insulin Aspart) .... 40 Units Three Times A Day 21)  Furosemide 40 Mg Tabs (Furosemide) .... 2 in The Morning and 1 in The Afternoon. You May Take An Extra Tablet Daily For Fluid Weight Gain of 3 Pounds 22)  Potassium Chloride Crys Cr 20 Meq Cr-Tabs (Potassium Chloride Crys Cr) .... One Tablet By Mouth Daily. Take An Extra Tablet For Fluid Weight Gain of 3 Pounds or More Daily As Needed  Current Medications (verified): 1)  Aspirin Ec 325 Mg Tbec (Aspirin) .... Take One Tablet By Mouth Daily 2)  Pravastatin Sodium 20 Mg Tabs (Pravastatin Sodium) .... 1/2 Tab At Bedtime 3)  Imuran 50 Mg Tabs (Azathioprine) .Marland Kitchen.. 1 Tab Two Times A Day 4)  Clonidine Hcl 0.2 Mg Tabs (Clonidine Hcl) .Marland Kitchen.. 12  Tab Three Times A Day 5)  Minoxidil 2.5 Mg Tabs (Minoxidil) .Marland Kitchen.. 1 Tab Two Times A Day 6)  Lantus 100 Unit/ml Soln (Insulin Glargine) .... 55 Units Once Daily 7)  Magnesium Oxide 250 Mg Tabs (Magnesium Oxide) .Marland Kitchen.. 1 Tab Two Times A Day 8)  Metoprolol Tartrate 100 Mg Tabs (Metoprolol Tartrate) .... Take 1 and 1/2 Tablets Twice A Day 9)  Novolog 100 Unit/ml Soln (Insulin Aspart) .Marland Kitchen.. 16 Units Three Times A Day W/meals 10)  Prednisone 10 Mg Tabs (Prednisone) .Marland Kitchen.. 1 Tab Once Daily 11)  Prograf 1 Mg Caps (Tacrolimus) .... 7 Am 6 Pm Caps Once Daily 12)  Nexium 40 Mg Cpdr (Esomeprazole Magnesium) .Marland Kitchen.. 1 Tab By Mouth Once Daily 13)  Multivitamins  Tabs (Multiple Vitamin) .Marland Kitchen.. 1 By Mouth Once Daily 14)  Miralax  Powd (Polyethylene Glycol 3350) .Marland Kitchen.. 1 Scoop Daily 15)  Docusate Sodium 100 Mg Caps (Docusate Sodium) .Marland Kitchen.. 1 By Mouth Two Times A Day At Bedtime 16)  Combigan 0.2-0.5 % Soln (Brimonidine Tartrate-Timolol) .Marland Kitchen.. 1 Drop Both Eyes Two Times A Day 17)  Isosorbide Mononitrate Cr 60 Mg Xr24h-Tab (Isosorbide Mononitrate) .Marland Kitchen.. 1po Once Daily 18)  Nitrostat 0.4 Mg Subl (Nitroglycerin) .... Use Asd 19)  Norvasc 10 Mg Tabs (Amlodipine Besylate) .Marland Kitchen.. 1 By Mouth Once Daily 20)  Novolog Flexpen 100  Unit/ml Soln (Insulin Aspart) .... 40 Units Three Times A Day 21)  Furosemide 40 Mg Tabs (Furosemide) .... 2 in The Morning and 1 in The Afternoon. You May Take An Extra Tablet Daily For Fluid Weight Gain of 3 Pounds 22)  Potassium Chloride Crys Cr 20 Meq Cr-Tabs (Potassium Chloride Crys Cr) .... One Tablet By Mouth Daily. Take An Extra Tablet For Fluid Weight Gain of 3 Pounds or More Daily As Needed 23)  Timolol Maleate 0.25 % Soln (Timolol Maleate) .Marland Kitchen.. 1 Drop Each Eye Two Times A Day 24)  Brimonidine Tartrate 0.2 % Soln (Brimonidine Tartrate) .Marland Kitchen.. 1 Drop Each  Eye Two Times A Day  Allergies (verified): 1)  ! Darvocet 2)  ! Keflex  Past History:  Past Medical History: Last updated: 11/14/2009 MYOCARDIAL INFARCTION, ACUTE, SUBENDOCARDIAL (ICD-410.70) CAD, NATIVE VESSEL (ICD-414.01) HYPERTENSION (ICD-401.9) NEPHROLITHIASIS, HX OF (ICD-V13.01) KIDNEY TRANSPLANTATION (ICD-V42.0) DIABETES MELLITUS, TYPE II (ICD-250.00) GERD (ICD-530.81) ASTHMA (ICD-493.90) MD roster:  card:  Dr Daleen Squibb                    renal: [facility] - Dr Ervin Knack                     GI:  [facility]  - Dr patel gastroparesis ?? 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  She has 4 children.  She does not drink or smoke.  Drug use-no work:  disabled/ SSI since  2002 moved to [place] in June 2011  Review of Systems       all otherwise negative per pt -    Physical Exam  General:  alert and overweight-appearing.   Head:  normocephalic and atraumatic Eyes:  vision grossly intact, pupils equal, and pupils round.   Ears:  R ear normal and L ear normal.   Nose:  no external deformity and no nasal discharge.   Mouth:  no gingival abnormalities and pharynx pink and moist.   Neck:  supple and no JVD.   Lungs:  Clear bilaterally to auscultation and percussion. Heart:  normal rate and regular rhythm.   Extremities:  no edema, no erythema  Psych:  not depressed appearing and slightly anxious.     Impression & Recommendations:  Problem # 1:  DIABETES MELLITUS, TYPE II (ICD-250.00)  Her updated medication list for this problem includes:    Aspirin Ec 325 Mg Tbec (Aspirin) .Marland Kitchen... Take one tablet by mouth daily    Lantus 100 Unit/ml Soln (Insulin glargine) .Marland KitchenMarland KitchenMarland KitchenMarland Kitchen 55 units once daily    Novolog 100 Unit/ml Soln (Insulin aspart) .Marland Kitchen... 16 units three times a day w/meals    Novolog Flexpen 100 Unit/ml Soln (Insulin aspart) .Marland KitchenMarland KitchenMarland KitchenMarland Kitchen 40 units three times a day uncontrolled cbg's in the am - to increase the lantus to 55 from the 50 she actually takes ;  f/u 2 mo for labs/a1c  Labs Reviewed: Creat: 1.0 (12/04/2009)    Reviewed HgBA1c results: 8.0 (11/14/2009)  Problem # 2:  HYPERTENSION (ICD-401.9)  Her updated medication list for this problem includes:    Clonidine Hcl 0.2 Mg Tabs (Clonidine hcl) .Marland Kitchen... 12  tab three times a day    Minoxidil 2.5 Mg Tabs (Minoxidil) .Marland Kitchen... 1 tab two times a day    Metoprolol Tartrate 100 Mg Tabs (Metoprolol tartrate) .Marland Kitchen... Take 1 and 1/2 tablets twice a day    Norvasc 10 Mg Tabs (Amlodipine besylate) .Marland Kitchen... 1 by mouth once daily    Furosemide 40 Mg Tabs (Furosemide) .Marland Kitchen... 2 in the morning and 1 in the afternoon. you may take  an extra tablet daily for fluid weight gain of 3 pounds labile, overall fair control  - Continue all  previous medications as before this visit , f/u BP at home and next visit  BP today: 140/82 Prior BP: 148/82 (02/28/2010)  Labs Reviewed: K+: 4.9 (12/04/2009) Creat: : 1.0 (12/04/2009)   Chol: 103 (11/14/2009)   HDL: 37.80 (11/14/2009)   LDL: 36 (11/14/2009)   TG: 146.0 (11/14/2009)  Problem # 3:  HYPERLIPIDEMIA (ICD-272.4)  Her updated medication list for this problem includes:    Pravastatin Sodium 20 Mg Tabs (Pravastatin sodium) .Marland Kitchen... 1/2 tab at bedtime    HDL:37.80 (11/14/2009)  LDL:36 (11/14/2009)  Chol:103 (11/14/2009)  Trig:146.0 (11/14/2009) stable overall by hx and exam, ok to continue meds/tx as is , Pt to continue diet efforts, good med tolerance; to check labs next visit as well  - goal LDL less than 70   Problem # 4:  CORONARY ARTERY DISEASE (ICD-414.00)  Her updated medication list for this problem includes:    Aspirin Ec 325 Mg Tbec (Aspirin) .Marland Kitchen... Take one tablet by mouth daily    Clonidine Hcl 0.2 Mg Tabs (Clonidine hcl) .Marland Kitchen... 12  tab three times a day    Minoxidil 2.5 Mg Tabs (Minoxidil) .Marland Kitchen... 1 tab two times a day    Metoprolol Tartrate 100 Mg Tabs (Metoprolol tartrate) .Marland Kitchen... Take 1 and 1/2 tablets twice a day    Isosorbide Mononitrate Cr 60 Mg Xr24h-tab (Isosorbide mononitrate) .Marland Kitchen... 1po once daily    Nitrostat 0.4 Mg Subl (Nitroglycerin) ..... Use asd    Norvasc 10 Mg Tabs (Amlodipine besylate) .Marland Kitchen... 1 by mouth once daily    Furosemide 40 Mg Tabs (Furosemide) .Marland Kitchen... 2 in the morning and 1 in the afternoon. you may take an extra tablet daily for fluid weight gain of 3 pounds  Labs Reviewed: Chol: 103 (11/14/2009)   HDL: 37.80 (11/14/2009)   LDL: 36 (11/14/2009)   TG: 146.0 (11/14/2009) stable overall by hx and exam, ok to continue meds/tx as is   Complete Medication List: 1)  Aspirin Ec 325 Mg Tbec (Aspirin) .... Take one tablet by mouth daily 2)  Pravastatin Sodium 20 Mg Tabs (Pravastatin sodium) .... 1/2 tab at bedtime 3)  Imuran 50 Mg Tabs (Azathioprine) .Marland Kitchen..  1 tab two times a day 4)  Clonidine Hcl 0.2 Mg Tabs (Clonidine hcl) .Marland Kitchen.. 12  tab three times a day 5)  Minoxidil 2.5 Mg Tabs (Minoxidil) .Marland Kitchen.. 1 tab two times a day 6)  Lantus 100 Unit/ml Soln (Insulin glargine) .... 55 units once daily 7)  Magnesium Oxide 250 Mg Tabs (Magnesium oxide) .Marland Kitchen.. 1 tab two times a day 8)  Metoprolol Tartrate 100 Mg Tabs (Metoprolol tartrate) .... Take 1 and 1/2 tablets twice a day 9)  Novolog 100 Unit/ml Soln (Insulin aspart) .Marland Kitchen.. 16 units three times a day w/meals 10)  Prednisone 10 Mg Tabs (Prednisone) .Marland Kitchen.. 1 tab once daily 11)  Prograf 1 Mg Caps (Tacrolimus) .... 7 am 6 pm caps once daily 12)  Nexium 40 Mg Cpdr (Esomeprazole magnesium) .Marland Kitchen.. 1 tab by mouth once daily 13)  Multivitamins Tabs (Multiple vitamin) .Marland Kitchen.. 1 by mouth once daily 14)  Miralax Powd (Polyethylene glycol 3350) .Marland Kitchen.. 1 scoop daily 15)  Docusate Sodium 100 Mg Caps (Docusate sodium) .Marland Kitchen.. 1 by mouth two times a day at bedtime 16)  Combigan 0.2-0.5 % Soln (Brimonidine tartrate-timolol) .Marland Kitchen.. 1 drop both eyes two times a day 17)  Isosorbide Mononitrate Cr 60 Mg Xr24h-tab (Isosorbide mononitrate) .Marland KitchenMarland KitchenMarland Kitchen  1po once daily 18)  Nitrostat 0.4 Mg Subl (Nitroglycerin) .... Use asd 19)  Norvasc 10 Mg Tabs (Amlodipine besylate) .Marland Kitchen.. 1 by mouth once daily 20)  Novolog Flexpen 100 Unit/ml Soln (Insulin aspart) .... 40 units three times a day 21)  Furosemide 40 Mg Tabs (Furosemide) .... 2 in the morning and 1 in the afternoon. you may take an extra tablet daily for fluid weight gain of 3 pounds 22)  Potassium Chloride Crys Cr 20 Meq Cr-tabs (Potassium chloride crys cr) .... One tablet by mouth daily. take an extra tablet for fluid weight gain of 3 pounds or more daily as needed 23)  Timolol Maleate 0.25 % Soln (Timolol maleate) .Marland Kitchen.. 1 drop each eye two times a day 24)  Brimonidine Tartrate 0.2 % Soln (Brimonidine tartrate) .Marland Kitchen.. 1 drop each eye two times a day  Patient Instructions: 1)  please increase the lantus to 55  units per day 2)  Continue all previous medications as before this visit  3)  You are given the refills today 4)  Please call for any others you may need 5)  Please schedule a follow-up appointment in 2 months to re-check the a1c    Orders Added: 1)  Est. Patient Level IV [16109]

## 2010-03-27 NOTE — Letter (Signed)
Summary: BP Log / Patient  BP Log / Patient   Imported By: Lennie Odor 03/23/2010 10:02:57  _____________________________________________________________________  External Attachment:    Type:   Image     Comment:   External Document

## 2010-03-29 LAB — GLUCOSE, RANDOM: Glucose, Bld: 357 mg/dL — ABNORMAL HIGH (ref 70–99)

## 2010-03-29 LAB — CARDIAC PANEL(CRET KIN+CKTOT+MB+TROPI)
CK, MB: 13.2 ng/mL (ref 0.3–4.0)
CK, MB: 29.7 ng/mL (ref 0.3–4.0)
Relative Index: 8.9 — ABNORMAL HIGH (ref 0.0–2.5)
Total CK: 148 U/L (ref 7–177)

## 2010-03-29 LAB — BASIC METABOLIC PANEL
BUN: 23 mg/dL (ref 6–23)
BUN: 40 mg/dL — ABNORMAL HIGH (ref 6–23)
CO2: 27 mEq/L (ref 19–32)
CO2: 31 mEq/L (ref 19–32)
Calcium: 9.6 mg/dL (ref 8.4–10.5)
Chloride: 103 mEq/L (ref 96–112)
Creatinine, Ser: 1.08 mg/dL (ref 0.4–1.2)
Creatinine, Ser: 1.32 mg/dL — ABNORMAL HIGH (ref 0.4–1.2)
GFR calc Af Amer: 50 mL/min — ABNORMAL LOW (ref >60)
GFR calc non Af Amer: 41 mL/min — ABNORMAL LOW (ref >60)
Glucose, Bld: 191 mg/dL — ABNORMAL HIGH (ref 70–99)
Glucose, Bld: 90 mg/dL (ref 70–99)
Potassium: 3.6 mEq/L (ref 3.5–5.1)
Potassium: 4.4 mEq/L (ref 3.5–5.1)
Sodium: 136 mEq/L (ref 135–145)

## 2010-03-29 LAB — GLUCOSE, CAPILLARY
Glucose-Capillary: 111 mg/dL — ABNORMAL HIGH (ref 70–99)
Glucose-Capillary: 127 mg/dL — ABNORMAL HIGH (ref 70–99)
Glucose-Capillary: 171 mg/dL — ABNORMAL HIGH (ref 70–99)
Glucose-Capillary: 184 mg/dL — ABNORMAL HIGH (ref 70–99)
Glucose-Capillary: 191 mg/dL — ABNORMAL HIGH (ref 70–99)
Glucose-Capillary: 225 mg/dL — ABNORMAL HIGH (ref 70–99)
Glucose-Capillary: 248 mg/dL — ABNORMAL HIGH (ref 70–99)
Glucose-Capillary: 250 mg/dL — ABNORMAL HIGH (ref 70–99)
Glucose-Capillary: 267 mg/dL — ABNORMAL HIGH (ref 70–99)
Glucose-Capillary: 271 mg/dL — ABNORMAL HIGH (ref 70–99)
Glucose-Capillary: 291 mg/dL — ABNORMAL HIGH (ref 70–99)
Glucose-Capillary: 367 mg/dL — ABNORMAL HIGH (ref 70–99)
Glucose-Capillary: 433 mg/dL — ABNORMAL HIGH (ref 70–99)

## 2010-03-29 LAB — CBC
HCT: 34.8 % — ABNORMAL LOW (ref 36.0–46.0)
HCT: 47.2 % — ABNORMAL HIGH (ref 36.0–46.0)
Hemoglobin: 14.7 g/dL (ref 12.0–15.0)
Hemoglobin: 15.1 g/dL — ABNORMAL HIGH (ref 12.0–15.0)
MCH: 27.5 pg (ref 26.0–34.0)
MCH: 27.6 pg (ref 26.0–34.0)
MCH: 28 pg (ref 26.0–34.0)
MCH: 28.1 pg (ref 26.0–34.0)
MCHC: 32 g/dL (ref 30.0–36.0)
MCHC: 32.6 g/dL (ref 30.0–36.0)
MCHC: 32.8 g/dL (ref 30.0–36.0)
MCV: 86 fL (ref 78.0–100.0)
MCV: 86.9 fL (ref 78.0–100.0)
MCV: 87.4 fL (ref 78.0–100.0)
Platelets: 211 10*3/uL (ref 150–400)
Platelets: 230 10*3/uL (ref 150–400)
Platelets: 263 10*3/uL (ref 150–400)
Platelets: 296 10*3/uL (ref 150–400)
RBC: 3.97 MIL/uL (ref 3.87–5.11)
RBC: 5.49 MIL/uL — ABNORMAL HIGH (ref 3.87–5.11)
RDW: 14.4 % (ref 11.5–15.5)
RDW: 14.4 % (ref 11.5–15.5)
RDW: 14.4 % (ref 11.5–15.5)
RDW: 14.7 % (ref 11.5–15.5)
WBC: 5.7 10*3/uL (ref 4.0–10.5)
WBC: 8.4 10*3/uL (ref 4.0–10.5)

## 2010-03-29 LAB — COMPREHENSIVE METABOLIC PANEL
ALT: 26 U/L (ref 0–35)
AST: 33 U/L (ref 0–37)
Albumin: 4.5 g/dL (ref 3.5–5.2)
Alkaline Phosphatase: 62 U/L (ref 39–117)
BUN: 18 mg/dL (ref 6–23)
CO2: 24 mEq/L (ref 19–32)
Calcium: 9.7 mg/dL (ref 8.4–10.5)
Chloride: 102 mEq/L (ref 96–112)
Creatinine, Ser: 0.84 mg/dL (ref 0.4–1.2)
GFR calc Af Amer: >60 mL/min (ref >60)
GFR calc non Af Amer: >60 mL/min (ref >60)
Glucose, Bld: 140 mg/dL — ABNORMAL HIGH (ref 70–99)
Potassium: 3.6 mEq/L (ref 3.5–5.1)
Sodium: 135 mEq/L (ref 135–145)
Total Bilirubin: 0.9 mg/dL (ref 0.3–1.2)
Total Protein: 8.1 g/dL (ref 6.0–8.3)

## 2010-03-29 LAB — URINALYSIS, ROUTINE W REFLEX MICROSCOPIC
Bilirubin Urine: NEGATIVE
Glucose, UA: NEGATIVE mg/dL
Ketones, ur: 15 mg/dL — AB
Ketones, ur: NEGATIVE mg/dL
Nitrite: NEGATIVE
Protein, ur: 100 mg/dL — AB
Protein, ur: NEGATIVE mg/dL
Urobilinogen, UA: 1 mg/dL (ref 0.0–1.0)
pH: 6 (ref 5.0–8.0)

## 2010-03-29 LAB — DIFFERENTIAL
Basophils Absolute: 0 10*3/uL (ref 0.0–0.1)
Basophils Relative: 0 % (ref 0–1)
Basophils Relative: 0 % (ref 0–1)
Eosinophils Absolute: 0 10*3/uL (ref 0.0–0.7)
Eosinophils Relative: 0 % (ref 0–5)
Monocytes Absolute: 0.4 10*3/uL (ref 0.1–1.0)
Neutrophils Relative %: 81 % — ABNORMAL HIGH (ref 43–77)

## 2010-03-29 LAB — URINALYSIS, DIPSTICK ONLY
Bilirubin Urine: NEGATIVE
Ketones, ur: NEGATIVE mg/dL
Nitrite: NEGATIVE
Protein, ur: 30 mg/dL — AB
Urobilinogen, UA: 1 mg/dL (ref 0.0–1.0)
pH: 6 (ref 5.0–8.0)

## 2010-03-29 LAB — URINE MICROSCOPIC-ADD ON

## 2010-03-29 LAB — BRAIN NATRIURETIC PEPTIDE: Pro B Natriuretic peptide (BNP): 184 pg/mL — ABNORMAL HIGH (ref 0.0–100.0)

## 2010-03-29 LAB — CK TOTAL AND CKMB (NOT AT ARMC)
CK, MB: 10.3 ng/mL (ref 0.3–4.0)
Relative Index: 7 — ABNORMAL HIGH (ref 0.0–2.5)
Total CK: 147 U/L (ref 7–177)

## 2010-03-29 LAB — HEPARIN LEVEL (UNFRACTIONATED)
Heparin Unfractionated: 0.32 IU/mL (ref 0.30–0.70)
Heparin Unfractionated: 0.54 IU/mL (ref 0.30–0.70)

## 2010-03-29 LAB — T3, FREE: T3, Free: 2.3 pg/mL (ref 2.3–4.2)

## 2010-03-29 LAB — URINE CULTURE: Culture  Setup Time: 201110042116

## 2010-03-29 LAB — TROPONIN I: Troponin I: 0.06 ng/mL (ref 0.00–0.06)

## 2010-03-29 LAB — HEMOGLOBIN A1C: Mean Plasma Glucose: 200 mg/dL — ABNORMAL HIGH (ref <117)

## 2010-04-02 LAB — CBC
HCT: 35.9 % — ABNORMAL LOW (ref 36.0–46.0)
HCT: 44.9 % (ref 36.0–46.0)
HCT: 47.9 % — ABNORMAL HIGH (ref 36.0–46.0)
Hemoglobin: 11.9 g/dL — ABNORMAL LOW (ref 12.0–15.0)
Hemoglobin: 15.7 g/dL — ABNORMAL HIGH (ref 12.0–15.0)
MCHC: 32.7 g/dL (ref 30.0–36.0)
MCHC: 32.7 g/dL (ref 30.0–36.0)
MCHC: 32.8 g/dL (ref 30.0–36.0)
MCV: 83.8 fL (ref 78.0–100.0)
MCV: 84.7 fL (ref 78.0–100.0)
MCV: 84.7 fL (ref 78.0–100.0)
MCV: 84.7 fL (ref 78.0–100.0)
Platelets: 217 10*3/uL (ref 150–400)
Platelets: 218 10*3/uL (ref 150–400)
Platelets: 250 10*3/uL (ref 150–400)
RBC: 4.22 MIL/uL (ref 3.87–5.11)
RBC: 4.52 MIL/uL (ref 3.87–5.11)
RBC: 5.72 MIL/uL — ABNORMAL HIGH (ref 3.87–5.11)
RDW: 15.1 % (ref 11.5–15.5)
RDW: 15.8 % — ABNORMAL HIGH (ref 11.5–15.5)
RDW: 16.1 % — ABNORMAL HIGH (ref 11.5–15.5)
RDW: 16.1 % — ABNORMAL HIGH (ref 11.5–15.5)
WBC: 3.5 10*3/uL — ABNORMAL LOW (ref 4.0–10.5)
WBC: 4.8 10*3/uL (ref 4.0–10.5)
WBC: 6.6 10*3/uL (ref 4.0–10.5)

## 2010-04-02 LAB — GLUCOSE, CAPILLARY
Glucose-Capillary: 121 mg/dL — ABNORMAL HIGH (ref 70–99)
Glucose-Capillary: 175 mg/dL — ABNORMAL HIGH (ref 70–99)
Glucose-Capillary: 200 mg/dL — ABNORMAL HIGH (ref 70–99)
Glucose-Capillary: 236 mg/dL — ABNORMAL HIGH (ref 70–99)
Glucose-Capillary: 250 mg/dL — ABNORMAL HIGH (ref 70–99)
Glucose-Capillary: 256 mg/dL — ABNORMAL HIGH (ref 70–99)
Glucose-Capillary: 271 mg/dL — ABNORMAL HIGH (ref 70–99)
Glucose-Capillary: 288 mg/dL — ABNORMAL HIGH (ref 70–99)
Glucose-Capillary: 85 mg/dL (ref 70–99)
Glucose-Capillary: 98 mg/dL (ref 70–99)

## 2010-04-02 LAB — URINALYSIS, ROUTINE W REFLEX MICROSCOPIC
Protein, ur: >300 mg/dL — AB
Urobilinogen, UA: 0.2 mg/dL (ref 0.0–1.0)

## 2010-04-02 LAB — BASIC METABOLIC PANEL
BUN: 21 mg/dL (ref 6–23)
BUN: 23 mg/dL (ref 6–23)
Calcium: 9 mg/dL (ref 8.4–10.5)
Calcium: 9.3 mg/dL (ref 8.4–10.5)
Calcium: 9.3 mg/dL (ref 8.4–10.5)
Chloride: 108 mEq/L (ref 96–112)
Chloride: 98 mEq/L (ref 96–112)
Creatinine, Ser: 1.09 mg/dL (ref 0.4–1.2)
Creatinine, Ser: 1.53 mg/dL — ABNORMAL HIGH (ref 0.4–1.2)
GFR calc Af Amer: 42 mL/min — ABNORMAL LOW (ref >60)
GFR calc Af Amer: >60 mL/min (ref >60)
GFR calc Af Amer: >60 mL/min (ref >60)
GFR calc non Af Amer: 35 mL/min — ABNORMAL LOW (ref >60)
GFR calc non Af Amer: 50 mL/min — ABNORMAL LOW (ref >60)
GFR calc non Af Amer: 52 mL/min — ABNORMAL LOW (ref >60)
GFR calc non Af Amer: >60 mL/min (ref >60)
Glucose, Bld: 104 mg/dL — ABNORMAL HIGH (ref 70–99)
Potassium: 3 mEq/L — ABNORMAL LOW (ref 3.5–5.1)
Potassium: 3.2 mEq/L — ABNORMAL LOW (ref 3.5–5.1)
Potassium: 3.3 mEq/L — ABNORMAL LOW (ref 3.5–5.1)
Sodium: 139 mEq/L (ref 135–145)
Sodium: 139 mEq/L (ref 135–145)

## 2010-04-02 LAB — MISCELLANEOUS TEST

## 2010-04-02 LAB — CK TOTAL AND CKMB (NOT AT ARMC)
CK, MB: 15.8 ng/mL (ref 0.3–4.0)
CK, MB: 18.5 ng/mL (ref 0.3–4.0)
Total CK: 165 U/L (ref 7–177)
Total CK: 188 U/L — ABNORMAL HIGH (ref 7–177)

## 2010-04-02 LAB — URINE CULTURE
Colony Count: NO GROWTH
Culture: NO GROWTH

## 2010-04-02 LAB — COMPREHENSIVE METABOLIC PANEL
ALT: 22 U/L (ref 0–35)
Albumin: 4.1 g/dL (ref 3.5–5.2)
BUN: 18 mg/dL (ref 6–23)
BUN: 19 mg/dL (ref 6–23)
CO2: 23 mEq/L (ref 19–32)
Calcium: 9.9 mg/dL (ref 8.4–10.5)
Creatinine, Ser: 1.06 mg/dL (ref 0.4–1.2)
Creatinine, Ser: 1.18 mg/dL (ref 0.4–1.2)
GFR calc non Af Amer: 47 mL/min — ABNORMAL LOW (ref >60)
Glucose, Bld: 236 mg/dL — ABNORMAL HIGH (ref 70–99)
Sodium: 135 mEq/L (ref 135–145)
Total Bilirubin: 1 mg/dL (ref 0.3–1.2)
Total Protein: 6.9 g/dL (ref 6.0–8.3)
Total Protein: 7.9 g/dL (ref 6.0–8.3)

## 2010-04-02 LAB — CORTISOL: Cortisol, Plasma: 27 ug/dL

## 2010-04-02 LAB — DIFFERENTIAL
Basophils Absolute: 0 10*3/uL (ref 0.0–0.1)
Eosinophils Absolute: 0 10*3/uL (ref 0.0–0.7)
Lymphocytes Relative: 10 % — ABNORMAL LOW (ref 12–46)
Lymphs Abs: 0.9 10*3/uL (ref 0.7–4.0)
Monocytes Absolute: 0.7 10*3/uL (ref 0.1–1.0)
Monocytes Relative: 10 % (ref 3–12)
Neutro Abs: 5.3 10*3/uL (ref 1.7–7.7)
Neutro Abs: 5.8 10*3/uL (ref 1.7–7.7)
Neutrophils Relative %: 77 % (ref 43–77)

## 2010-04-02 LAB — LIPASE, BLOOD: Lipase: 22 U/L (ref 11–59)

## 2010-04-02 LAB — HEPARIN LEVEL (UNFRACTIONATED)
Heparin Unfractionated: 0.28 IU/mL — ABNORMAL LOW (ref 0.30–0.70)
Heparin Unfractionated: 0.38 IU/mL (ref 0.30–0.70)
Heparin Unfractionated: 0.4 IU/mL (ref 0.30–0.70)

## 2010-04-02 LAB — TACROLIMUS LEVEL: Tacrolimus (FK506) - LabCorp: 3 ng/mL

## 2010-04-02 LAB — URINE MICROSCOPIC-ADD ON

## 2010-04-02 LAB — CARDIAC PANEL(CRET KIN+CKTOT+MB+TROPI)
CK, MB: 24.8 ng/mL (ref 0.3–4.0)
Total CK: 118 U/L (ref 7–177)
Troponin I: 0.25 ng/mL — ABNORMAL HIGH (ref 0.00–0.06)
Troponin I: 0.28 ng/mL — ABNORMAL HIGH (ref 0.00–0.06)

## 2010-04-02 LAB — POCT CARDIAC MARKERS

## 2010-04-02 LAB — TROPONIN I
Troponin I: 0.22 ng/mL — ABNORMAL HIGH (ref 0.00–0.06)
Troponin I: 0.25 ng/mL — ABNORMAL HIGH (ref 0.00–0.06)

## 2010-04-02 LAB — HEPARIN ANTI-XA: Heparin LMW: 0.32 IU/mL

## 2010-04-02 LAB — LIPID PANEL: VLDL: 22 mg/dL (ref 0–40)

## 2010-04-10 ENCOUNTER — Other Ambulatory Visit: Payer: Self-pay

## 2010-04-10 MED ORDER — DOCUSATE SODIUM 100 MG PO CAPS: 100.0000 mg | ORAL_CAPSULE | Freq: Two times a day (BID) | ORAL | Status: AC

## 2010-04-10 NOTE — Telephone Encounter (Signed)
[facility] [employer] requesting refill on Stool softener.

## 2010-04-16 ENCOUNTER — Encounter: Payer: Self-pay | Admitting: Internal Medicine

## 2010-05-04 ENCOUNTER — Other Ambulatory Visit: Payer: Self-pay | Admitting: Internal Medicine

## 2010-05-07 ENCOUNTER — Other Ambulatory Visit: Payer: Self-pay

## 2010-05-07 MED ORDER — INSULIN GLARGINE 100 UNIT/ML ~~LOC~~ SOLN: 55.0000 [IU] | Freq: Every day | SUBCUTANEOUS | Status: DC

## 2010-05-09 ENCOUNTER — Other Ambulatory Visit: Payer: Self-pay | Admitting: *Deleted

## 2010-05-09 ENCOUNTER — Telehealth: Payer: Self-pay | Admitting: Cardiology

## 2010-05-09 MED ORDER — NITROGLYCERIN 0.4 MG SL SUBL: 0.4000 mg | SUBLINGUAL_TABLET | SUBLINGUAL | Status: DC | PRN

## 2010-05-09 NOTE — Telephone Encounter (Signed)
Walk in Pt Form " pt needs Refill on NTG 0.4mg " sent to [facility]bbie/ 05/09/10/km

## 2010-05-28 ENCOUNTER — Telehealth: Payer: Self-pay | Admitting: Cardiology

## 2010-05-28 NOTE — Telephone Encounter (Signed)
Pt will call her pcp, Dr. Excell Seltzer, for miralax prescription.  She said she has to take it often and it is more affordable if she has a prescription. Mylo Red RN

## 2010-05-28 NOTE — Telephone Encounter (Signed)
Needs script for Miralax sent to [facility] [phone]

## 2010-05-28 NOTE — Telephone Encounter (Signed)
Ok - to robin to handle, usual daily dose ok  (17 gm in water qd)

## 2010-05-29 MED ORDER — POLYETHYLENE GLYCOL 3350 17 GM/SCOOP PO POWD: 17.0000 g | Freq: Every day | ORAL | Status: AC

## 2010-05-29 NOTE — Telephone Encounter (Signed)
Addended by: Scharlene Gloss on: 05/29/2010 09:40 AM   Modules accepted: Orders

## 2010-06-13 ENCOUNTER — Encounter: Payer: Self-pay | Admitting: Cardiology

## 2010-06-22 ENCOUNTER — Telehealth: Payer: Self-pay | Admitting: Cardiology

## 2010-06-22 MED ORDER — FUROSEMIDE 40 MG PO TABS: ORAL_TABLET | ORAL | Status: DC

## 2010-06-22 NOTE — Telephone Encounter (Signed)
Please call Lasix  Into [facility] [phone].  Pt is completely out of this medication

## 2010-07-20 ENCOUNTER — Encounter: Payer: Self-pay | Admitting: Cardiology

## 2010-09-03 ENCOUNTER — Inpatient Hospital Stay (HOSPITAL_COMMUNITY)
Admission: EM | Admit: 2010-09-03 | Discharge: 2010-09-06 | DRG: 103 | Disposition: A | Discharge status: Home / Self Care | Payer: Medicare Other | Source: Ambulatory Visit | Attending: Internal Medicine | Admitting: Internal Medicine

## 2010-09-03 ENCOUNTER — Emergency Department (HOSPITAL_COMMUNITY): Payer: Medicare Other

## 2010-09-03 ENCOUNTER — Encounter (HOSPITAL_COMMUNITY): Payer: Self-pay | Admitting: Radiology

## 2010-09-03 DIAGNOSIS — I674 Hypertensive encephalopathy: Secondary | ICD-10-CM | POA: Diagnosis present

## 2010-09-03 DIAGNOSIS — I2510 Atherosclerotic heart disease of native coronary artery without angina pectoris: Secondary | ICD-10-CM | POA: Diagnosis present

## 2010-09-03 DIAGNOSIS — Z79899 Other long term (current) drug therapy: Secondary | ICD-10-CM

## 2010-09-03 DIAGNOSIS — B028 Zoster with other complications: Secondary | ICD-10-CM | POA: Diagnosis present

## 2010-09-03 DIAGNOSIS — G8929 Other chronic pain: Secondary | ICD-10-CM | POA: Diagnosis present

## 2010-09-03 DIAGNOSIS — K219 Gastro-esophageal reflux disease without esophagitis: Secondary | ICD-10-CM | POA: Diagnosis present

## 2010-09-03 DIAGNOSIS — IMO0001 Reserved for inherently not codable concepts without codable children: Secondary | ICD-10-CM | POA: Diagnosis present

## 2010-09-03 DIAGNOSIS — Z7982 Long term (current) use of aspirin: Secondary | ICD-10-CM

## 2010-09-03 DIAGNOSIS — Z940 Kidney transplant status: Secondary | ICD-10-CM

## 2010-09-03 DIAGNOSIS — M549 Dorsalgia, unspecified: Secondary | ICD-10-CM | POA: Diagnosis present

## 2010-09-03 DIAGNOSIS — I509 Heart failure, unspecified: Secondary | ICD-10-CM | POA: Diagnosis present

## 2010-09-03 DIAGNOSIS — I10 Essential (primary) hypertension: Secondary | ICD-10-CM | POA: Diagnosis present

## 2010-09-03 DIAGNOSIS — IMO0002 Reserved for concepts with insufficient information to code with codable children: Secondary | ICD-10-CM

## 2010-09-03 DIAGNOSIS — I5032 Chronic diastolic (congestive) heart failure: Secondary | ICD-10-CM | POA: Diagnosis present

## 2010-09-03 DIAGNOSIS — G500 Trigeminal neuralgia: Secondary | ICD-10-CM | POA: Diagnosis present

## 2010-09-03 DIAGNOSIS — R51 Headache: Principal | ICD-10-CM | POA: Diagnosis present

## 2010-09-03 HISTORY — DX: Kidney transplant status: Z94.0

## 2010-09-03 HISTORY — DX: Essential (primary) hypertension: I10

## 2010-09-03 LAB — POCT I-STAT, CHEM 8
Calcium, Ion: 1.15 mmol/L (ref 1.12–1.32)
Creatinine, Ser: 0.8 mg/dL (ref 0.50–1.10)
Glucose, Bld: 148 mg/dL — ABNORMAL HIGH (ref 70–99)
HCT: 43 % (ref 36.0–46.0)
Hemoglobin: 14.6 g/dL (ref 12.0–15.0)
Potassium: 4 mEq/L (ref 3.5–5.1)

## 2010-09-03 LAB — GLUCOSE, CAPILLARY
Glucose-Capillary: 151 mg/dL — ABNORMAL HIGH (ref 70–99)
Glucose-Capillary: 142 mg/dL — ABNORMAL HIGH (ref 70–99)

## 2010-09-03 LAB — URINE MICROSCOPIC-ADD ON

## 2010-09-03 LAB — COMPREHENSIVE METABOLIC PANEL
Alkaline Phosphatase: 61 U/L (ref 39–117)
BUN: 16 mg/dL (ref 6–23)
Chloride: 99 mEq/L (ref 96–112)
Creatinine, Ser: 0.62 mg/dL (ref 0.50–1.10)
GFR calc Af Amer: >60 mL/min (ref >60)
GFR calc non Af Amer: >60 mL/min (ref >60)
Glucose, Bld: 151 mg/dL — ABNORMAL HIGH (ref 70–99)
Potassium: 3.8 mEq/L (ref 3.5–5.1)
Total Bilirubin: 0.4 mg/dL (ref 0.3–1.2)

## 2010-09-03 LAB — URINALYSIS, ROUTINE W REFLEX MICROSCOPIC
Bilirubin Urine: NEGATIVE
Ketones, ur: 15 mg/dL — AB
Protein, ur: >300 mg/dL — AB
Urobilinogen, UA: 0.2 mg/dL (ref 0.0–1.0)

## 2010-09-03 LAB — CK TOTAL AND CKMB (NOT AT ARMC)
CK, MB: 6.6 ng/mL (ref 0.3–4.0)
Relative Index: 4.2 — ABNORMAL HIGH (ref 0.0–2.5)

## 2010-09-03 LAB — DIFFERENTIAL
Basophils Relative: 0 % (ref 0–1)
Monocytes Absolute: 0.5 10*3/uL (ref 0.1–1.0)
Monocytes Relative: 5 % (ref 3–12)
Neutro Abs: 7.2 10*3/uL (ref 1.7–7.7)

## 2010-09-03 LAB — HEPATIC FUNCTION PANEL
ALT: 12 U/L (ref 0–35)
AST: 22 U/L (ref 0–37)
Alkaline Phosphatase: 62 U/L (ref 39–117)
Total Protein: 7.5 g/dL (ref 6.0–8.3)

## 2010-09-03 LAB — CBC
HCT: 41.6 % (ref 36.0–46.0)
Hemoglobin: 13.8 g/dL (ref 12.0–15.0)
Hemoglobin: 13.5 g/dL (ref 12.0–15.0)
MCH: 27.9 pg (ref 26.0–34.0)
MCH: 27.7 pg (ref 26.0–34.0)
MCHC: 33.2 g/dL (ref 30.0–36.0)
MCHC: 33.1 g/dL (ref 30.0–36.0)
RDW: 14.9 % (ref 11.5–15.5)

## 2010-09-03 LAB — RAPID URINE DRUG SCREEN, HOSP PERFORMED
Amphetamines: NOT DETECTED
Benzodiazepines: NOT DETECTED
Cocaine: NOT DETECTED
Opiates: NOT DETECTED

## 2010-09-03 LAB — POCT I-STAT TROPONIN I: Troponin i, poc: 0.02 ng/mL (ref 0.00–0.08)

## 2010-09-03 LAB — AMMONIA: Ammonia: 37 umol/L (ref 11–60)

## 2010-09-03 LAB — PROTIME-INR: Prothrombin Time: 13.6 seconds (ref 11.6–15.2)

## 2010-09-03 LAB — MRSA PCR SCREENING: MRSA by PCR: NEGATIVE

## 2010-09-04 ENCOUNTER — Inpatient Hospital Stay (HOSPITAL_COMMUNITY): Payer: Medicare Other

## 2010-09-04 LAB — URINE CULTURE

## 2010-09-04 LAB — GLUCOSE, CAPILLARY
Glucose-Capillary: 218 mg/dL — ABNORMAL HIGH (ref 70–99)
Glucose-Capillary: 414 mg/dL — ABNORMAL HIGH (ref 70–99)

## 2010-09-04 LAB — LIPID PANEL
HDL: 36 mg/dL — ABNORMAL LOW (ref >39)
LDL Cholesterol: 68 mg/dL (ref 0–99)
Total CHOL/HDL Ratio: 3.8 RATIO
Triglycerides: 160 mg/dL — ABNORMAL HIGH (ref <150)
VLDL: 32 mg/dL (ref 0–40)

## 2010-09-04 LAB — HEMOGLOBIN A1C
Hgb A1c MFr Bld: 9.3 % — ABNORMAL HIGH (ref <5.7)
Hgb A1c MFr Bld: 9 % — ABNORMAL HIGH (ref <5.7)
Mean Plasma Glucose: 212 mg/dL — ABNORMAL HIGH (ref <117)

## 2010-09-04 LAB — CARDIAC PANEL(CRET KIN+CKTOT+MB+TROPI)
CK, MB: 4.4 ng/mL — ABNORMAL HIGH (ref 0.3–4.0)
Troponin I: <0.3 ng/mL (ref <0.30)

## 2010-09-05 LAB — CBC
MCH: 27.7 pg (ref 26.0–34.0)
MCHC: 32.6 g/dL (ref 30.0–36.0)
Platelets: 213 10*3/uL (ref 150–400)

## 2010-09-05 LAB — GLUCOSE, CAPILLARY
Glucose-Capillary: 234 mg/dL — ABNORMAL HIGH (ref 70–99)
Glucose-Capillary: 96 mg/dL (ref 70–99)

## 2010-09-05 LAB — BASIC METABOLIC PANEL
Calcium: 9.8 mg/dL (ref 8.4–10.5)
GFR calc non Af Amer: 53 mL/min — ABNORMAL LOW (ref >60)
Sodium: 136 mEq/L (ref 135–145)

## 2010-09-06 ENCOUNTER — Encounter: Payer: Self-pay | Admitting: Cardiology

## 2010-09-06 LAB — BASIC METABOLIC PANEL
Chloride: 103 mEq/L (ref 96–112)
Creatinine, Ser: 0.93 mg/dL (ref 0.50–1.10)
GFR calc Af Amer: >60 mL/min (ref >60)
GFR calc non Af Amer: >60 mL/min (ref >60)
Potassium: 3.4 mEq/L — ABNORMAL LOW (ref 3.5–5.1)

## 2010-09-06 LAB — GLUCOSE, CAPILLARY
Glucose-Capillary: 169 mg/dL — ABNORMAL HIGH (ref 70–99)
Glucose-Capillary: 199 mg/dL — ABNORMAL HIGH (ref 70–99)

## 2010-09-07 LAB — GLUCOSE, CAPILLARY: Glucose-Capillary: 120 mg/dL — ABNORMAL HIGH (ref 70–99)

## 2010-09-07 NOTE — Discharge Summary (Signed)
Monica Lee               ACCOUNT NO.:  [id number]  MEDICAL RECORD NO.:  [id number]  LOCATION:  6709                         FACILITY:  [facility]  PHYSICIAN:  Lonia Blood, M.D.       DATE OF BIRTH:  08-19-1950  DATE OF ADMISSION:  09/03/2010 DATE OF DISCHARGE:  09/06/2010                              DISCHARGE SUMMARY   PRIMARY CARE PHYSICIAN:  Dr. Oliver Barre, [facility].  PRIMARY NEPHROLOGIST:  Dr. Fayrene Fearing Deterding with [facility].  PRIMARY CARDIOLOGIST:  Dr. Valera Castle with [facility].  DISCHARGE DIAGNOSES: 1. Severe headache of unclear etiology - rule out hypertensive     encephalopathy, most likely doubt trigeminal neuralgia - rule out     early zoster. 2. Severe hypertension. 3. End-stage renal disease status post renal transplant. 4. Diabetes mellitus type 2 uncontrolled prior to admission. 5. Chronic back pain. 6. Gastroesophageal reflux disease. 7. Nonobstructive coronary artery disease per cardiac catheterization.  DISCHARGE MEDICATIONS: 1. Prednisone 10 mg daily. 2. Brimonidine 0.2% ophthalmic solution in the right eye, one drop     twice a day. 3. Fioricet one tablet every 4 hours as needed for headaches. 4. Neurontin 300 mg three times a day for one week. 5. Metoprolol 100 mg one and a half tablet twice a day. 6. Timolol 0.5 mg ophthalmic solution to right eye one drop twice a     day. 7. Stool softener 100 mg over-the-counter one tablet daily as needed. 8. Clonidine 0.2 mg by mouth three times a day. 9. Prednisolone acetate to the left eye one drop twice a day. 10.Amlodipine 10 mg daily. 11.Hydralazine 50 mg three times a day. 12.Minoxidil 2.5 mg twice a day. 13.Azathioprine 50 mg two tablets daily. 14.Prograf 1 mg six capsules every 12 hours. 15.Insulin NovoLog 16 units three times a day before meals. 16.Lantus 55 units at bedtime. 17.Lasix 80 mg in the morning and 40 mg at night. 18.Imdur 60 mg daily. 19.Aspirin  325 mg daily. 20.Nexium 40 mg daily. 21.Potassium 20 mEq one to tablets daily as needed. 22.Magnesium 400 mg daily. 23.Calcitriol 0.25 mcg daily.  CONDITION ON DISCHARGE:  Monica Lee was discharged in good condition, alert, oriented, in no acute distress headache free.  Temperature 98.6, heart rate 61, respirations 17, blood pressure 138/73, saturation 95% on room air.  Discharge creatinine 0.9.  The patient will follow up with Dr. Fayrene Fearing Deterding as previously scheduled for further titration of the antihypertensives.  PROCEDURE DURING THIS ADMISSION:  The patient underwent an MRI of the brain, September 04, 2010, showing no acute intracranial abnormality, diffuse white matter changes, remote lacunar infarcts, chronic changes of the left globe and orbit due to glaucoma.  CONSULTATION DURING THIS ADMISSION:  The patient was seen in consultation by Dr. Pearlean Brownie from Neurology.  HISTORY AND PHYSICAL:  Refer dictated H and P done by Dr. Sharl Ma.  HOSPITAL COURSE:  Monica Lee is a 60 year old woman with status post renal transplant, hypertension, diabetes, coronary artery disease, presented to the emergency room on September 03, 2010 with severe headache. Initially, the etiology of her headache was unclear.  She was also noted to have extremely elevated blood pressure readings upon  admission to level of 237/101.  She was admitted to the intensive care unit with presumed diagnosis of hypertensive encephalopathy.  She was started on intravenous labetalol, and then as soon as she was able to swallow, she was placed on oral clonidine, hydralazine, Imdur, minoxidil, Lasix, and amlodipine.  She improved by September 05, 2010.  She was transferred out of the intensive care unit.  On arrival to the floor, she was having severe right facial headache in the trigeminal nerve distribution. Blood pressure was up to 202/63, suggesting that it might have been elevated secondary to the headache.  After the pain was  controlled with Neurontin and Fioricet, the patient's blood pressure readings improved as a matter of fact she states stable with values into the 130/70, 140/70, 150/70, 119/68 on the medication that she was discharged home on.  I had a long discussion with Ms. Turek, encouraged her to take all these medications be given to her in the hospital and bring the blood pressure readings to Dr. Darrick Penna for analysis to see if she needs any changes to be made.  Otherwise, she is discharged home with a presumed diagnosis of maybe hypertensive encephalopathy versus trigeminal neuralgia versus possible early zoster as an explanation for admission headache.  Her symptoms are currently controlled, blood pressure is controlled, and the renal function is normal.     Lonia Blood, M.D.     SL/MEDQ  D:  09/07/2010  T:  09/07/2010  Job:  [id number]  cc:   Corwin Levins, MD [facility] Aliment. Deterding, M.D. Thomas C. Daleen Squibb, MD, [facility]  Electronically Signed by Lonia Blood M.D. on 09/07/2010 01:15:41 PM

## 2010-09-10 LAB — CULTURE, BLOOD (ROUTINE X 2): Culture  Setup Time: 201208210122

## 2010-09-12 NOTE — Consult Note (Signed)
Monica Lee, Monica Lee               ACCOUNT NO.:  [id number]  MEDICAL RECORD NO.:  [id number]  LOCATION:  2602                         FACILITY:  [facility]  PHYSICIAN:  Pramod P. Pearlean Brownie, MD    DATE OF BIRTH:  [birth year]  DATE OF CONSULTATION: DATE OF DISCHARGE:                                CONSULTATION   REFERRING PHYSICIAN:  [facility].  REASON FOR REFERRAL:  Altered mental status and encephalopathy.  HISTORY OF PRESENT ILLNESS:  Monica Lee is a 60 year old African American lady who woke up this morning with confusion, headache, nausea, and some slurred speech.  Her blood sugar at home was 70 and subsequently repeated by EMS was 118 mg percent.  She was found to have her elevated blood pressure of 237/101 in the emergency room and after receiving IV labetalol, it came down and at present is 129/90.  She has obtained some improvement in her mental status, but still little confused and has gone back to her baseline.  At that time, it was also noticed by the family that she is not seeing well from her right eye.  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 She has remote history of TIA, but is unable to give me those details. She denied any extremity weakness today, gait balance problems, double vision, or vertigo.  PAST MEDICAL HISTORY:  Significant for: 1. Diabetes. 2. Hypertension. 3. Congestive heart failure. 4. Gastroesophageal reflux disease. 5. Kidney stones.  HOME MEDICATIONS:  Metoprolol, amoxicillin, brimonidine, calcitriol, clonidine, furosemide, hydralazine, isosorbide, magnesium, minoxidil, Nexium, prednisone, tacrolimus, timolol maleate, amlodipine, aspirin, azathioprine, and docusate sodium.  SOCIAL HISTORY:  The patient does not smoke or drink.  She lives at home with family.  REVIEW OF SYSTEMS:  Negative for recent fever, cough, chest pain, or diarrheal illness.  PHYSICAL EXAMINATION:  GENERAL:   Reveals a middle-aged African American lady who is slightly restless in bed. VITAL SIGNS:  She is currently afebrile, pulse rate is 80 per minute and regular, blood pressure 129/90, respiratory rate 18 per minute, distal pulses are well felt. HEENT:  Head is nontraumatic. NECK:  Supple.  There is no bruit.  Hearing appears normal. CARDIAC:  Soft ejection murmur. LUNGS:  Clear to auscultation. ABDOMEN:  Soft, nontender. NEUROLOGIC:  The patient is drowsy, but can arouse easily.  She follows commands well.  Her speech is nonfluent and slightly hesitant, but she can identify family members and speak short sentences.  She follows simple one and few two-step commands.  She has diminished short-term memory registration and recall and tension.  She knows that she is in the hospital, but cannot name, and she knows the city.  Her eye movements are full range.  There are no nystagmus.  She has some mechanical ptosis in the left eye with the left eye being blind.  She does not have any light perception on the left eye.  She has right temporal visual field loss and does not blink to threat on the right, but does so in the nasal quadrant in the right eye.  There is no facial weakness.  Tongue is midline.  There is no upper  or lower extremity drift.  She has symmetric and equal strength in the both of her extremities.  There is mild proximal hip flexor weakness bilaterally, right slightly more than left.  Deep tendon reflexes are 2+ symmetric except knee jerks are depressed and ankle jerks are absent.  She has some decreased touch and pinprick sensation over feet bilaterally.  Gait was not tested.  DATA REVIEWED:  Noncontrast CAT scan of the head done today reveals no acute abnormality.  UA is negative.  Chest x-ray shows mild cardiomegaly and interstitial edema.  Liver enzymes and WBC count are normal. Alcohol level is less than 11.  Basic metabolic panel labs are normal. Lactic acid level is  0.7.  Ammonia level is 37.  IMPRESSION:  A 60 year old lady with sudden onset of encephalopathy and confusion in the setting of significantly elevated hypertension suggestive of accelerated hypertension with hypertensive encephalopathy. Presence of partial right eye vision loss certainly raises possibility of left posterior cerebral artery infarct versus posterior reversible encephalopathy syndrome as well.  PLAN:  I would recommend checking MRI scan of the brain to rule look for changes of posterior reversible encephalopathy syndrome versus left posterior cerebral artery infarct and further workup will depend upon the above findings.  Agree with aggressive blood pressure control, keep systolic blood pressure goal below 140 and use IV labetalol p.r.n. Check swallowing function.  If the patient is able to swallow, resume her home medications.  Continue aspirin.  I had a long discussion with the patient's daughter and brother regarding her symptoms, plan for evaluation and treatment, and answered questions.  Kindly call for questions.     Pramod P. Pearlean Brownie, MD     PPS/MEDQ  D:  09/03/2010  T:  09/04/2010  Job:  [id number]  Electronically Signed by Delia Heady MD on 09/12/2010 08:33:36 PM

## 2010-09-17 ENCOUNTER — Other Ambulatory Visit: Payer: Self-pay | Admitting: Cardiology

## 2010-09-25 ENCOUNTER — Encounter: Payer: Self-pay | Admitting: Cardiology

## 2010-09-25 ENCOUNTER — Ambulatory Visit (INDEPENDENT_AMBULATORY_CARE_PROVIDER_SITE_OTHER): Payer: Medicare Other | Admitting: Cardiology

## 2010-09-25 VITALS — BP 124/64 | HR 80 | Ht 65.0 in | Wt 194.0 lb

## 2010-09-25 DIAGNOSIS — I2510 Atherosclerotic heart disease of native coronary artery without angina pectoris: Secondary | ICD-10-CM

## 2010-09-25 DIAGNOSIS — I5032 Chronic diastolic (congestive) heart failure: Secondary | ICD-10-CM

## 2010-09-25 DIAGNOSIS — I509 Heart failure, unspecified: Secondary | ICD-10-CM

## 2010-09-25 DIAGNOSIS — I10 Essential (primary) hypertension: Secondary | ICD-10-CM

## 2010-09-25 NOTE — Assessment & Plan Note (Signed)
Currently under good control. On extensive number of meds and will not change. Strongly advised her and her daughter did not come off of these in the future.

## 2010-09-25 NOTE — Assessment & Plan Note (Signed)
Stable. No change in medications. Nitroglycerin reviewed.

## 2010-09-25 NOTE — Progress Notes (Signed)
HPI Monica Lee comes in today for evaluation and management of her coronary artery disease. She was recently hospitalized for hypertensive encephalopathy. She tells me that her clonidine and hydralazine had been discontinued by probably G. Please see discharge summary.  She's back on all her meds her pressure is good today. He's had no angina or ischemic symptoms. She denies palpitations or presyncope. She's had no significant edema. EKG reviewed from the hospital. Past Medical History  Diagnosis Date  . CHF (congestive heart failure)   . Hypertension   . Diabetes mellitus   . Asthma   . Acid reflux   . Kidney Kanner   . S/P cataract surgery   . Hx of cholecystectomy   . S/P kidney transplant   . MI (myocardial infarction)   . CAD (coronary artery disease)     native vessel  . Nephrolithiasis   . GERD (gastroesophageal reflux disease)   . Diastolic dysfunction   . Hyperlipidemia   . Anemia   . DJD (degenerative joint disease)   . TIA (transient ischemic attack)   . Hx of colonic polyps   . Hx of colonoscopy   . PUD (peptic ulcer disease)   . Blind left eye   . Diabetic retinopathy     Past Surgical History  Procedure Date  . Transplantation renal   . Cardiac catheterization   . Abdominal hysterectomy   . Cholecystectomy   . Cataract extraction   . Intestinal perforation surgery 01/2009  . Breast biopsy 2010    Family History  Problem Relation Age of Onset  . Kidney disease Neg Hx   . Arthritis Other     parent  . Heart disease Other     parent  . Hypertension Other     parent  . Diabetes Other     parent  . Diabetes Other     grandparent  . Thyroid disease Other     several    History   Social History  . Marital Status: Married    Spouse Name: N/A    Number of Children: 4  . Years of Education: N/A   Occupational History  . Disables/SSI since 2002    Social History Main Topics  . Smoking status: Never Smoker   . Smokeless tobacco: Not on file  .  Alcohol Use: No  . Drug Use: No  . Sexually Active: Not on file   Other Topics Concern  . Not on file   Social History Narrative  . No narrative on file    Allergies  Allergen Reactions  . Cephalexin     REACTION: edema/face  . Propoxyphene N-Acetaminophen     Current Outpatient Prescriptions  Medication Sig Dispense Refill  . amLODipine (NORVASC) 10 MG tablet Take 10 mg by mouth daily.        Marland Kitchen aspirin EC 325 MG tablet Take 325 mg by mouth daily.        Marland Kitchen azaTHIOprine (IMURAN) 50 MG tablet Take 50 mg by mouth 2 (two) times daily.        . brimonidine (ALPHAGAN) 0.2 % ophthalmic solution Place 1 drop into both eyes 2 (two) times daily.        . brimonidine-timolol (COMBIGAN) 0.2-0.5 % ophthalmic solution Place 1 drop into both eyes 2 (two) times daily.        . butalbital-acetaminophen-caffeine (FIORICET, ESGIC) 50-325-40 MG per tablet Take 1 tablet by mouth every 6 (six) hours as needed.        Marland Kitchen  calcitRIOL (ROCALTROL) 0.25 MCG capsule Take 0.25 mcg by mouth daily.        . cloNIDine (CATAPRES) 0.2 MG tablet Take 0.2 mg by mouth 3 (three) times daily.       . furosemide (LASIX) 40 MG tablet Take 2 tablets in the morning and 1 tablet PM  90 tablet  6  . insulin glargine (LANTUS) 100 UNIT/ML injection Inject 55 Units into the skin daily.  10 mL  12  . isosorbide mononitrate (IMDUR) 60 MG 24 hr tablet Take 60 mg by mouth daily.        . Magnesium Oxide 250 MG TABS Take 1 tablet by mouth 2 (two) times daily.        . metoprolol (LOPRESSOR) 100 MG tablet TAKE ONE & ONE-HALF TABLETS BY MOUTH TWICE DAILY  90 tablet  3  . minoxidil (LONITEN) 2.5 MG tablet Take 2.5 mg by mouth 2 (two) times daily.        . Multiple Vitamin (MULTIVITAMIN) capsule Take 1 capsule by mouth daily.        Marland Kitchen NEXIUM 40 MG capsule TAKE ONE CAPSULE BY MOUTH EVERY DAY  90 each  3  . nitroGLYCERIN (NITROSTAT) 0.4 MG SL tablet Place 1 tablet (0.4 mg total) under the tongue every 5 (five) minutes as needed for chest  pain.  25 tablet  12  . polyethylene glycol powder (MIRALAX) powder Take 17 g by mouth daily.        . potassium chloride SA (K-DUR,KLOR-CON) 20 MEQ tablet Take 20 mEq by mouth daily. Take an extra tablet for fluid weight gain of 3 pounds or more daily as needed.      . pravastatin (PRAVACHOL) 20 MG tablet Take 10 mg by mouth at bedtime.        . predniSONE (DELTASONE) 10 MG tablet Take 10 mg by mouth daily.        . tacrolimus (PROGRAF) 1 MG capsule Take 1 mg by mouth 2 (two) times daily.       . timolol (TIMOPTIC) 0.25 % ophthalmic solution Place 1 drop into both eyes 2 (two) times daily.          ROS Negative other than HPI.   PE General Appearance: well developed, well nourished in no acute distress, obese HEENT: symmetrical face, PERRLA, poor dentition  Neck: no JVD, thyromegaly, or adenopathy, trachea midline Chest: symmetric without deformity Cardiac: PMI non-displaced, RRR, normal S1, S2, no gallop or murmur Lung: clear to ausculation and percussion Vascular: all pulses full without bruits  Abdominal: nondistended, nontender, good bowel sounds, no HSM, no bruits Extremities: no cyanosis, clubbing or edema, no sign of DVT, no varicosities  Skin: normal color, no rashes Neuro: alert and oriented x 3, non-focal Pysch: normal affect Filed Vitals:   09/25/10 1104  BP: 124/64  Pulse: 80  Height: 5\' 5"  (1.651 m)  Weight: 194 lb (87.998 kg)    EKG  Labs and Studies Reviewed.   Lab Results  Component Value Date   WBC 5.1 09/05/2010   HGB 11.1* 09/05/2010   HCT 34.0* 09/05/2010   MCV 84.8 09/05/2010   PLT 213 09/05/2010      Chemistry      Component Value Date/Time   NA 139 09/06/2010 0525   K 3.4* 09/06/2010 0525   CL 103 09/06/2010 0525   CO2 30 09/06/2010 0525   BUN 20 09/06/2010 0525   CREATININE 0.93 09/06/2010 0525      Component Value Date/Time  CALCIUM 9.4 09/06/2010 0525   ALKPHOS 62 09/03/2010 1455   ALKPHOS 61 09/03/2010 1455   AST 22 09/03/2010 1455   AST 20  09/03/2010 1455   ALT 12 09/03/2010 1455   ALT 13 09/03/2010 1455   BILITOT 0.4 09/03/2010 1455   BILITOT 0.4 09/03/2010 1455       Lab Results  Component Value Date   CHOL 136 09/04/2010   CHOL 103 11/14/2009   CHOL  Value: 104        ATP III CLASSIFICATION:  <200     mg/dL   Desirable  161-096  mg/dL   Borderline High  >=045    mg/dL   High        04/22/8117   Lab Results  Component Value Date   HDL 36* 09/04/2010   HDL 37.80* 11/14/2009   HDL 45 06/23/2009   Lab Results  Component Value Date   LDLCALC 68 09/04/2010   LDLCALC 36 11/14/2009   LDLCALC  Value: 37        Total Cholesterol/HDL:CHD Risk Coronary Heart Disease Risk Table                     Men   Women  1/2 Average Risk   3.4   3.3  Average Risk       5.0   4.4  2 X Average Risk   9.6   7.1  3 X Average Risk  23.4   11.0        Use the calculated Patient Ratio above and the CHD Risk Table to determine the patient's CHD Risk.        ATP III CLASSIFICATION (LDL):  <100     mg/dL   Optimal  147-829  mg/dL   Near or Above                    Optimal  130-159  mg/dL   Borderline  562-130  mg/dL   High  >865     mg/dL   Very High 7/84/6962   Lab Results  Component Value Date   TRIG 160* 09/04/2010   TRIG 146.0 11/14/2009   TRIG 108 06/23/2009   Lab Results  Component Value Date   CHOLHDL 3.8 09/04/2010   CHOLHDL 3 11/14/2009   CHOLHDL 2.3 06/23/2009   Lab Results  Component Value Date   HGBA1C 9.0* 09/04/2010   Lab Results  Component Value Date   ALT 12 09/03/2010   ALT 13 09/03/2010   AST 22 09/03/2010   AST 20 09/03/2010   ALKPHOS 62 09/03/2010   ALKPHOS 61 09/03/2010   BILITOT 0.4 09/03/2010   BILITOT 0.4 09/03/2010   Lab Results  Component Value Date   TSH 0.46 11/14/2009

## 2010-09-25 NOTE — Patient Instructions (Addendum)
Your physician recommends that you schedule a follow-up appointment in: 1 year with Dr. Daleen Squibb  Please see handout on use of Nitroglycerin

## 2010-09-26 ENCOUNTER — Other Ambulatory Visit: Payer: Self-pay | Admitting: Internal Medicine

## 2010-09-26 ENCOUNTER — Encounter: Payer: Self-pay | Admitting: Internal Medicine

## 2010-09-26 ENCOUNTER — Ambulatory Visit (INDEPENDENT_AMBULATORY_CARE_PROVIDER_SITE_OTHER): Payer: Medicare Other | Admitting: Internal Medicine

## 2010-09-26 VITALS — BP 110/68 | HR 68 | Temp 98.0°F | Ht 65.0 in | Wt 194.5 lb

## 2010-09-26 DIAGNOSIS — I10 Essential (primary) hypertension: Secondary | ICD-10-CM

## 2010-09-26 DIAGNOSIS — E785 Hyperlipidemia, unspecified: Secondary | ICD-10-CM

## 2010-09-26 DIAGNOSIS — R21 Rash and other nonspecific skin eruption: Secondary | ICD-10-CM | POA: Insufficient documentation

## 2010-09-26 DIAGNOSIS — E119 Type 2 diabetes mellitus without complications: Secondary | ICD-10-CM

## 2010-09-26 DIAGNOSIS — R51 Headache: Secondary | ICD-10-CM

## 2010-09-26 MED ORDER — INSULIN ASPART 100 UNIT/ML ~~LOC~~ SOLN: SUBCUTANEOUS | Status: DC

## 2010-09-26 MED ORDER — HYDRALAZINE HCL 50 MG PO TABS: 50.0000 mg | ORAL_TABLET | Freq: Three times a day (TID) | ORAL | Status: DC

## 2010-09-26 MED ORDER — INSULIN GLARGINE 100 UNIT/ML ~~LOC~~ SOLN: 55.0000 [IU] | Freq: Every day | SUBCUTANEOUS | Status: DC

## 2010-09-26 MED ORDER — POLYETHYLENE GLYCOL 3350 17 GM/SCOOP PO POWD: 17.0000 g | Freq: Every day | ORAL | Status: DC

## 2010-09-26 MED ORDER — TRIAMCINOLONE ACETONIDE 0.1 % EX CREA: TOPICAL_CREAM | Freq: Two times a day (BID) | CUTANEOUS | Status: AC

## 2010-09-26 NOTE — Assessment & Plan Note (Signed)
Unclear etiology, finished the 1 wk gabapentin, no further pain, ok to stay off the gabapentin for now

## 2010-09-26 NOTE — Patient Instructions (Signed)
Please change the novolog to 16 with bfast, then 20 units with lunch, and 20 units with dinner Take all new medications as prescribed - the steroid cream Continue all other medications as before You will be contacted regarding the referral for: dermatology You can stop the gabapentin as you have Your reflls for lantus, novolog and miralax were sent to the pharmacy

## 2010-09-26 NOTE — Assessment & Plan Note (Signed)
Uncontrolled, recent a1c per [facility] aug 2012  - 9.0, reviewed with pt;  Will keep the lantus the same as she has occas low sugar in the am, but increase the novolog with her bigger meals - to 16 - 20 - 20; f/u next visit

## 2010-09-26 NOTE — Assessment & Plan Note (Signed)
?   Atopic dermatitis - for triam cr, and refer derm

## 2010-09-26 NOTE — Progress Notes (Signed)
Subjective:    Patient ID: Monica Lee, female    DOB: 15-Jul-1950, 60 y.o.   MRN: [id number]  HPI  Here to f/u hospn, which in part involved facial pain ? Trigeminal neuralgia now finished with the gabapentin and no further pain, and no redness, swelling.  Here to f/u; overall doing ok,  Pt denies chest pain, increased sob or doe, wheezing, orthopnea, PND, increased LE swelling, palpitations, dizziness or syncope.  Pt denies new neurological symptoms such as new headache, or facial or extremity weakness or numbness   Pt denies polydipsia, polyuria, or low sugar symptoms such as weakness or confusion improved with po intake except for occasional AM low sugar even before last hospn .  Pt states overall good compliance with meds, trying to follow lower cholesterol, diabetic diet, wt overall stable but little exercise however.   Also s/p June 13 foot surgury  - did well.   Incidentally with itchy rash to upper back and neck for several wks. Past Medical History  Diagnosis Date  . CHF (congestive heart failure)   . Hypertension   . Diabetes mellitus   . Asthma   . Acid reflux   . Kidney [place]   . S/P cataract surgery   . Hx of cholecystectomy   . S/P kidney transplant   . MI (myocardial infarction)   . CAD (coronary artery disease)     native vessel  . Nephrolithiasis   . GERD (gastroesophageal reflux disease)   . Diastolic dysfunction   . Hyperlipidemia   . Anemia   . DJD (degenerative joint disease)   . TIA (transient ischemic attack)   . Hx of colonic polyps   . Hx of colonoscopy   . PUD (peptic ulcer disease)   . Blind left eye   . Diabetic retinopathy    Past Surgical History  Procedure Date  . Transplantation renal   . Cardiac catheterization   . Abdominal hysterectomy   . Cholecystectomy   . Cataract extraction   . Intestinal perforation surgery 01/2009  . Breast biopsy 2010    reports that she has never smoked. She does not have any smokeless tobacco history on file. She  reports that she does not drink alcohol or use illicit drugs. family history includes Arthritis in her other; Diabetes in her others; Heart disease in her other; Hypertension in her other; and Thyroid disease in her other.  There is no history of Kidney disease. Allergies  Allergen Reactions  . Cephalexin     REACTION: edema/face  . Propoxyphene N-Acetaminophen    Current Outpatient Prescriptions on File Prior to Visit  Medication Sig Dispense Refill  . amLODipine (NORVASC) 10 MG tablet Take 10 mg by mouth daily.        Marland Kitchen aspirin EC 325 MG tablet Take 325 mg by mouth daily.        Marland Kitchen azaTHIOprine (IMURAN) 50 MG tablet Take 50 mg by mouth 2 (two) times daily.        . brimonidine (ALPHAGAN) 0.2 % ophthalmic solution Place 1 drop into both eyes 2 (two) times daily.        . brimonidine-timolol (COMBIGAN) 0.2-0.5 % ophthalmic solution Place 1 drop into both eyes 2 (two) times daily.        . butalbital-acetaminophen-caffeine (FIORICET, ESGIC) 50-325-40 MG per tablet Take 1 tablet by mouth every 6 (six) hours as needed.        . calcitRIOL (ROCALTROL) 0.25 MCG capsule Take 0.25 mcg by mouth daily.        Marland Kitchen  cloNIDine (CATAPRES) 0.2 MG tablet Take 0.2 mg by mouth 3 (three) times daily.       . furosemide (LASIX) 40 MG tablet Take 2 tablets in the morning and 1 tablet PM  90 tablet  6  . isosorbide mononitrate (IMDUR) 60 MG 24 hr tablet Take 60 mg by mouth daily.        . Magnesium Oxide 250 MG TABS Take 1 tablet by mouth 2 (two) times daily.        . metoprolol (LOPRESSOR) 100 MG tablet TAKE ONE & ONE-HALF TABLETS BY MOUTH TWICE DAILY  90 tablet  3  . minoxidil (LONITEN) 2.5 MG tablet Take 2.5 mg by mouth 2 (two) times daily.        . Multiple Vitamin (MULTIVITAMIN) capsule Take 1 capsule by mouth daily.        Marland Kitchen NEXIUM 40 MG capsule TAKE ONE CAPSULE BY MOUTH EVERY DAY  90 each  3  . nitroGLYCERIN (NITROSTAT) 0.4 MG SL tablet Place 1 tablet (0.4 mg total) under the tongue every 5 (five) minutes as  needed for chest pain.  25 tablet  12  . potassium chloride SA (K-DUR,KLOR-CON) 20 MEQ tablet Take 20 mEq by mouth daily. Take an extra tablet for fluid weight gain of 3 pounds or more daily as needed.      . pravastatin (PRAVACHOL) 20 MG tablet Take 10 mg by mouth at bedtime.        . tacrolimus (PROGRAF) 1 MG capsule Take 1 mg by mouth 2 (two) times daily.       . timolol (TIMOPTIC) 0.25 % ophthalmic solution Place 1 drop into both eyes 2 (two) times daily.         Review of Systems Review of Systems  Constitutional: Negative for diaphoresis and unexpected weight change.  HENT: Negative for drooling and tinnitus.   Eyes: Negative for photophobia and visual disturbance.  Respiratory: Negative for choking and stridor.   Gastrointestinal: Negative for vomiting and blood in stool.  Objective:   Physical Exam BP 110/68  Pulse 68  Temp(Src) 98 F (36.7 C) (Oral)  Ht 5\' 5"  (1.651 m)  Wt 194 lb 8 oz (88.225 kg)  BMI 32.37 kg/m2  SpO2 96% Physical Exam  VS noted Constitutional: Pt appears well-developed and well-nourished.  HENT: Head: Normocephalic.  Right Ear: External ear normal.  Left Ear: External ear normal.  Eyes: Conjunctivae and EOM are normal. Pupils are equal, round, and reactive to light.  Neck: Normal range of motion. Neck supple.  Cardiovascular: Normal rate and regular rhythm.   Pulmonary/Chest: Effort normal and breath sounds normal.  Neurological: Pt is alert. No cranial nerve deficit.  Skin: Skin is warm. No erythema. except for silvery itchy rash to the upper back and lateral right neck area Psychiatric: Pt behavior is normal. Thought content normal.     Assessment & Plan:

## 2010-09-27 ENCOUNTER — Ambulatory Visit: Payer: Medicare Other | Admitting: Internal Medicine

## 2010-09-29 ENCOUNTER — Encounter: Payer: Self-pay | Admitting: Internal Medicine

## 2010-09-29 NOTE — Assessment & Plan Note (Signed)
stable overall by hx and exam, most recent data reviewed with pt, and pt to continue medical treatment as before BP Readings from Last 3 Encounters:  09/26/10 110/68  09/25/10 124/64  03/21/10 140/82

## 2010-09-29 NOTE — Assessment & Plan Note (Signed)
stable overall by hx and exam, most recent data reviewed with pt, and pt to continue medical treatment as before  Lab Results  Component Value Date   HGBA1C 9.0* 09/04/2010   Lab Results  Component Value Date   LDLCALC 68 09/04/2010

## 2010-10-22 ENCOUNTER — Other Ambulatory Visit: Payer: Self-pay | Admitting: Cardiology

## 2010-10-22 ENCOUNTER — Telehealth: Payer: Self-pay

## 2010-10-22 ENCOUNTER — Encounter: Payer: Self-pay | Admitting: Endocrinology

## 2010-10-22 ENCOUNTER — Ambulatory Visit (INDEPENDENT_AMBULATORY_CARE_PROVIDER_SITE_OTHER)
Admission: RE | Admit: 2010-10-22 | Discharge: 2010-10-22 | Disposition: A | Discharge status: Home / Self Care | Payer: Medicare Other | Source: Ambulatory Visit | Attending: Endocrinology | Admitting: Endocrinology

## 2010-10-22 ENCOUNTER — Ambulatory Visit (INDEPENDENT_AMBULATORY_CARE_PROVIDER_SITE_OTHER): Payer: Medicare Other | Admitting: Endocrinology

## 2010-10-22 ENCOUNTER — Other Ambulatory Visit (INDEPENDENT_AMBULATORY_CARE_PROVIDER_SITE_OTHER): Payer: Medicare Other

## 2010-10-22 VITALS — BP 130/82 | HR 78 | Temp 98.1°F | Ht 65.0 in | Wt 197.0 lb

## 2010-10-22 DIAGNOSIS — R062 Wheezing: Secondary | ICD-10-CM

## 2010-10-22 DIAGNOSIS — E041 Nontoxic single thyroid nodule: Secondary | ICD-10-CM

## 2010-10-22 LAB — TSH: TSH: 0.72 u[IU]/mL (ref 0.35–5.50)

## 2010-10-22 MED ORDER — HYDRALAZINE HCL 50 MG PO TABS: 50.0000 mg | ORAL_TABLET | Freq: Three times a day (TID) | ORAL | Status: DC

## 2010-10-22 MED ORDER — PROMETHAZINE-CODEINE 6.25-10 MG/5ML PO SYRP: 5.0000 mL | ORAL_SOLUTION | ORAL | Status: AC | PRN

## 2010-10-22 MED ORDER — DOXYCYCLINE HYCLATE 100 MG PO TABS: 100.0000 mg | ORAL_TABLET | Freq: Two times a day (BID) | ORAL | Status: AC

## 2010-10-22 NOTE — H&P (Signed)
Monica Lee, SCHOENBERGER               ACCOUNT NO.:  [id number]  MEDICAL RECORD NO.:  [id number]  LOCATION:  [facility]                         FACILITY:  [facility]  PHYSICIAN:  Mauro Kaufmann, MD         DATE OF BIRTH:  Nov 29, 1950  DATE OF ADMISSION:  09/03/2010 DATE OF DISCHARGE:                             HISTORY & PHYSICAL   CHIEF COMPLAINT:  Altered mental status.  HISTORY OF PRESENT ILLNESS:  A 60 year old female with a history of diabetes mellitus, hypertension, nephrolithiasis status post renal transplant on chronic prednisone therapy, who came to the hospital after the patient developed slurred speech this morning.  As per the husband who provides most of the history, the patient woke up this morning and when she was talking to her daughter, she was having slurred speech. Also, she complained of headache and vomited.  The patient has a history of hypertension and is on multiple antihypertensive medications.  When she was brought to hospital, the patient's blood pressure was elevated to 237/101, which is now improving.  The patient is still in altered mental status.  She is alert and not oriented x3.  There has been no fever.  No history of passing out.  No chest pain.  No shortness of breath.  No history of seizures.  As per husband, the patient did not complain of any other symptoms except the headache and vomiting.  There was no diarrhea associated.  The patient also has history of diabetes mellitus and she was found to have normal blood sugar when she came here to the ER.  The patient did throw up all the medications she took this morning.  The CAT scan of the head done in the hospital does not show any CVA.  PAST MEDICAL HISTORY: 1. GERD. 2. Diabetes mellitus. 3. Hypertension. 4. Nephrolithiasis. 5. Status post renal transplant for end-stage renal disease. 6. History of chronic nausea and vomiting. 7. Chronic back pain.  CURRENT MEDICATIONS: 1. Metoprolol 150 mg p.o.  b.i.d. 2. Amoxicillin 2000 mg as needed. 3. Brimonidine 1 drop three times a day. 4. Calcitriol 0.25 mcg once a day. 5. Clonidine 0.2 mg p.o. t.i.d. 6. Furosemide 40 mg daily p.r.n. 7. Hydralazine 50 mg once a day. 8. Isosorbide mononitrate 60 mg once a day. 9. Magnesium oxide 400 mg p.o. daily. 10.Minoxidil 2.5 mg twice a day. 11.Nexium 40 mg once a day. 12.Prednisone 10 mg once a day. 13.Tacrolimus 1 mg twice a day. 14.Timolol 1 drop. 15.Amlodipine 10 mg once a day. 16.Aspirin 325 mg p.o. daily. 17.Azathioprine 100 mg once a day. 18.Docusate sodium 100 mg as needed.  ALLERGIES:  THE PATIENT HAS ALLERGIES TO DARVOCET.  FAMILY HISTORY:  Significant for CAD.  SOCIAL HISTORY:  The patient does not smoke cigarettes.  No history of alcohol or drug abuse.  REVIEW OF SYSTEMS:  As in the HPI.  Rest of the review of systems could not be obtained as the patient has altered mental status.  PHYSICAL EXAMINATION:  VITAL SIGNS:  The patient's blood pressure on arrival was 237/101, at this time the blood pressure is down to 177/90, pulse 77, respiration is 20, temperature 99.7. HEENT:  Head is  atraumatic, normocephalic.  Eyes, extraocular muscles are intact. NECK:  Supple. CHEST:  Clear to auscultation bilaterally. HEART:  S1 and S2, regular in rate and rhythm. ABDOMEN:  Soft and nontender.  No organomegaly. EXTREMITIES:  No cyanosis, no clubbing, no edema. NEUROLOGIC:  The patient is alert, but not oriented x3.  The patient is able to move all the four extremities.  Motor strength is 5/5 in both upper and lower extremities.  Sensations are intact.  No other focal deficit noted this time.  LABORATORY DATA:  WBC 8.3, hemoglobin is 14.6, hematocrit 43.0, platelet count of 231.  Sodium 137, potassium is 4.0, chloride 102, CO2 of 26, BUN 18, creatinine 0.80, glucose 148.  Urinalysis shows negative nitrite, negative leukocytes.  Blood culture and urine culture is pending at this  time.  ASSESSMENT: 1. Altered mental status. 2. Hypertensive encephalopathy. 3. Status post renal transplant for end-stage renal disease. 4. Diabetes mellitus. 5. Coronary artery disease.  PLAN: 1. Altered mental status.  The patient's altered mental status at this     time appears to be secondary to hypertensive encephalopathy as the     patient's blood pressure was elevated when she came to the     hospital, but we will also obtain MRI of the head to rule out     underlying CVA.  Neurology consultation has already been obtained.     The patient has also a low-grade temperature of 99.7 and has been     on multiple immunosuppressive therapy, so infectious etiology is     also under consideration.  We will contact Infectious Disease     specialist to assist in management of this patient with chronic     immunosuppressive therapy. 2. Hypertension.  The patient's blood pressure at this time is     improving and we will start the patient back on her medication     including metoprolol and clonidine and hold other medications.  The     patient will be given labetalol 10 mg IV q.4 h p.r.n. for BP more     than 180/105. 3. Diabetes mellitus.  The patient will be put on sliding scale of     insulin. 4. DVT prophylaxis with SCDs. 5. Status post renal transplant.  The patient's all the     immunosuppressive medications will be continued at this time     including the prednisone, tacrolimus, azathioprine.  At this time,     the patient will be admitted to the step-down unit.     Mauro Kaufmann, MD     GL/MEDQ  D:  09/03/2010  T:  09/03/2010  Job:  [id number]  Electronically Signed by Mauro Kaufmann  on 10/22/2010 04:04:32 PM

## 2010-10-22 NOTE — Telephone Encounter (Signed)
Pt called requesting Rx for cough, please advise.

## 2010-10-22 NOTE — Telephone Encounter (Signed)
i printed 

## 2010-10-22 NOTE — Progress Notes (Signed)
Subjective:    Patient ID: Modena Slater, female    DOB: 1950-07-12, 60 y.o.   MRN: [id number]  HPI Pt states 5 days of slight pain at the throat, and assoc itching of the eac's.  She has a dry cough. Past Medical History  Diagnosis Date  . CHF (congestive heart failure)   . Hypertension   . Diabetes mellitus   . Asthma   . Acid reflux   . Kidney [place]   . S/P cataract surgery   . Hx of cholecystectomy   . S/P kidney transplant   . MI (myocardial infarction)   . CAD (coronary artery disease)     native vessel  . Nephrolithiasis   . GERD (gastroesophageal reflux disease)   . Diastolic dysfunction   . Hyperlipidemia   . Anemia   . DJD (degenerative joint disease)   . TIA (transient ischemic attack)   . Hx of colonic polyps   . Hx of colonoscopy   . PUD (peptic ulcer disease)   . Blind left eye   . Diabetic retinopathy     Past Surgical History  Procedure Date  . Transplantation renal   . Cardiac catheterization   . Abdominal hysterectomy   . Cholecystectomy   . Cataract extraction   . Intestinal perforation surgery 01/2009  . Breast biopsy 2010    History   Social History  . Marital Status: Married    Spouse Name: N/A    Number of Children: 4  . Years of Education: N/A   Occupational History  . Disables/[employer] since 2002    Social History Main Topics  . Smoking status: Never Smoker   . Smokeless tobacco: Not on file  . Alcohol Use: No  . Drug Use: No  . Sexually Active: Not on file   Other Topics Concern  . Not on file   Social History Narrative  . No narrative on file    Current Outpatient Prescriptions on File Prior to Visit  Medication Sig Dispense Refill  . amLODipine (NORVASC) 10 MG tablet Take 10 mg by mouth daily.        Marland Kitchen aspirin EC 325 MG tablet Take 325 mg by mouth daily.        Marland Kitchen azaTHIOprine (IMURAN) 50 MG tablet Take 50 mg by mouth 2 (two) times daily.        . brimonidine (ALPHAGAN) 0.2 % ophthalmic solution Place 1 drop into both eyes  2 (two) times daily.        . brimonidine-timolol (COMBIGAN) 0.2-0.5 % ophthalmic solution Place 1 drop into both eyes 2 (two) times daily.        . calcitRIOL (ROCALTROL) 0.25 MCG capsule Take 0.25 mcg by mouth daily.        . cloNIDine (CATAPRES) 0.2 MG tablet Take 0.2 mg by mouth 3 (three) times daily.       . furosemide (LASIX) 40 MG tablet Take 2 tablets in the morning and 1 tablet PM  90 tablet  6  . insulin aspart (NOVOLOG FLEXPEN) 100 UNIT/ML injection Use as directed three times daily with meals a - 16 - 20 - 20  50 mL  12  . insulin glargine (LANTUS) 100 UNIT/ML injection Inject 55 Units into the skin daily.  30 mL  12  . isosorbide mononitrate (IMDUR) 60 MG 24 hr tablet Take 60 mg by mouth daily.        . Magnesium Oxide 250 MG TABS Take 1 tablet by mouth 2 (  two) times daily.        . metoprolol (LOPRESSOR) 100 MG tablet TAKE ONE & ONE-HALF TABLETS BY MOUTH TWICE DAILY  90 tablet  3  . minoxidil (LONITEN) 2.5 MG tablet Take 2.5 mg by mouth 2 (two) times daily.        . Multiple Vitamin (MULTIVITAMIN) capsule Take 1 capsule by mouth daily.        Marland Kitchen NEXIUM 40 MG capsule TAKE ONE CAPSULE BY MOUTH EVERY DAY  90 each  3  . nitroGLYCERIN (NITROSTAT) 0.4 MG SL tablet Place 1 tablet (0.4 mg total) under the tongue every 5 (five) minutes as needed for chest pain.  25 tablet  12  . polyethylene glycol powder (MIRALAX) powder Take 17 g by mouth daily.  850 g  11  . potassium chloride SA (K-DUR,KLOR-CON) 20 MEQ tablet Take 20 mEq by mouth daily. Take an extra tablet for fluid weight gain of 3 pounds or more daily as needed.      . pravastatin (PRAVACHOL) 20 MG tablet Take 10 mg by mouth at bedtime.        . predniSONE (DELTASONE) 10 MG tablet TAKE ONE TABLET BY MOUTH EVERY DAY  90 tablet  3  . tacrolimus (PROGRAF) 1 MG capsule Take 1 mg by mouth 2 (two) times daily.       . timolol (TIMOPTIC) 0.25 % ophthalmic solution Place 1 drop into both eyes 2 (two) times daily.        Marland Kitchen triamcinolone  (KENALOG) 0.1 % cream Apply topically 2 (two) times daily.  30 g  0  . butalbital-acetaminophen-caffeine (FIORICET, ESGIC) 50-325-40 MG per tablet Take 1 tablet by mouth every 6 (six) hours as needed.          Allergies  Allergen Reactions  . Cephalexin     REACTION: edema/face  . Propoxyphene N-Acetaminophen     Family History  Problem Relation Age of Onset  . Kidney disease Neg Hx   . Arthritis Other     parent  . Heart disease Other     parent  . Hypertension Other     parent  . Diabetes Other     parent  . Diabetes Other     grandparent  . Thyroid disease Other     several    BP 130/82  Pulse 78  Temp(Src) 98.1 F (36.7 C) (Oral)  Ht 5\' 5"  (1.651 m)  Wt 197 lb (89.359 kg)  BMI 32.78 kg/m2  SpO2 92%  Review of Systems She also has slight wheezing.  No fever.      Objective:   Physical Exam VITAL SIGNS:  See vs page GENERAL: no distress head: no deformity eyes: no periorbital swelling, no proptosis external nose and ears are normal mouth: no lesion seen Both eac's and tm's are normal NECK: There is a 2 cm right thyroid nodule.  No palpable lymphadenopathy at the anterior neck. 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

## 2010-10-22 NOTE — Patient Instructions (Addendum)
A thyroid blood test, and chest-x-ray, are being requested for you today.  please call [phone] to hear your test results.  You will be prompted to enter the 9-digit "MRN" number that appears at the top left of this page, followed by #.  Then you will hear the message.  let's check a thyroid "scan" (a special, but easy and painless type of thyroid x ray).  It works like this: you go to the x-ray department of the hospital to swallow a pill, which contains a miniscule amount of radiation.  You will not notice any symptoms from this.  You will go back to the x-ray department the next day, to lie down in front of a camera.  The results of this will be sent to me.  please call [phone] to hear your test results.  You will be prompted to enter the 9-digit "MRN" number that appears at the top left of this page, followed by #.  Then you will hear the message. Please return here 1-2 days after the thyroid x-ray. here is a sample of "[id number]."  take 1 puff 2x a day.  rinse mouth after using. i have sent a prescription to your pharmacy, for an antibiotic. Apply non-prescription hydrocortisone to the ear canals, to stop the itching.

## 2010-10-23 ENCOUNTER — Ambulatory Visit: Payer: Medicare Other | Admitting: Internal Medicine

## 2010-10-23 ENCOUNTER — Other Ambulatory Visit: Payer: Self-pay

## 2010-10-23 MED ORDER — HYDRALAZINE HCL 50 MG PO TABS: 50.0000 mg | ORAL_TABLET | Freq: Three times a day (TID) | ORAL | Status: DC

## 2010-10-23 NOTE — Telephone Encounter (Signed)
Rx faxed to pharmacy. Pt informed via VM.

## 2010-10-29 ENCOUNTER — Other Ambulatory Visit: Payer: Self-pay

## 2010-11-05 ENCOUNTER — Other Ambulatory Visit: Payer: Self-pay | Admitting: Internal Medicine

## 2010-11-07 ENCOUNTER — Encounter (HOSPITAL_COMMUNITY)
Admission: RE | Admit: 2010-11-07 | Discharge: 2010-11-07 | Disposition: A | Discharge status: Home / Self Care | Payer: Medicare Other | Source: Ambulatory Visit | Attending: Endocrinology | Admitting: Endocrinology

## 2010-11-07 DIAGNOSIS — E041 Nontoxic single thyroid nodule: Secondary | ICD-10-CM

## 2010-11-07 DIAGNOSIS — E042 Nontoxic multinodular goiter: Secondary | ICD-10-CM | POA: Insufficient documentation

## 2010-11-08 ENCOUNTER — Ambulatory Visit (HOSPITAL_COMMUNITY): Payer: Medicare Other

## 2010-11-08 ENCOUNTER — Encounter (HOSPITAL_COMMUNITY): Payer: Self-pay

## 2010-11-08 ENCOUNTER — Encounter (HOSPITAL_COMMUNITY)
Admission: RE | Admit: 2010-11-08 | Discharge: 2010-11-08 | Disposition: A | Discharge status: Home / Self Care | Payer: Medicare Other | Source: Ambulatory Visit | Attending: Endocrinology | Admitting: Endocrinology

## 2010-11-08 MED ORDER — SODIUM PERTECHNETATE TC 99M INJECTION
10.9000 | Freq: Once | INTRAVENOUS | Status: AC | PRN
  Administered 2010-11-08: 10.9 via INTRAVENOUS

## 2010-11-08 MED ORDER — SODIUM IODIDE I 131 CAPSULE: 7.1000 | Freq: Once | INTRAVENOUS | Status: AC | PRN

## 2010-11-09 ENCOUNTER — Other Ambulatory Visit (HOSPITAL_COMMUNITY): Payer: Medicare Other

## 2010-11-09 ENCOUNTER — Telehealth: Payer: Self-pay | Admitting: *Deleted

## 2010-11-09 NOTE — Telephone Encounter (Signed)
Per MD, pt is due for F/U on thyroid. Called pt at number listed above, number busy.

## 2010-11-09 NOTE — Telephone Encounter (Signed)
Message copied by Carin Primrose on Fri Nov 09, 2010  4:50 PM ------      Message from: Romero Belling      Created: Thu Nov 08, 2010  7:10 PM       please call patient:      Ov is needed to f/u the thyroid

## 2010-11-14 ENCOUNTER — Encounter: Payer: Self-pay | Admitting: Endocrinology

## 2010-11-14 ENCOUNTER — Ambulatory Visit
Admission: RE | Admit: 2010-11-14 | Discharge: 2010-11-14 | Disposition: A | Discharge status: Home / Self Care | Payer: Medicare Other | Source: Ambulatory Visit | Attending: Endocrinology | Admitting: Endocrinology

## 2010-11-14 ENCOUNTER — Ambulatory Visit (INDEPENDENT_AMBULATORY_CARE_PROVIDER_SITE_OTHER): Payer: Medicare Other | Admitting: Endocrinology

## 2010-11-14 VITALS — BP 136/88 | HR 72 | Temp 98.2°F | Ht 65.0 in | Wt 194.5 lb

## 2010-11-14 DIAGNOSIS — E041 Nontoxic single thyroid nodule: Secondary | ICD-10-CM

## 2010-11-14 DIAGNOSIS — E042 Nontoxic multinodular goiter: Secondary | ICD-10-CM

## 2010-11-14 NOTE — Patient Instructions (Addendum)
Let's check the ultrasound of the thyroid.  you will receive a phone call, about a day and time for an appointment.  If the right nodule is the only or largest one, please come back here for the biopsy here.  If that is benign, i would order for you a treatment pill of radioactive iodine.  Although it is a larger amount of radiation, you will again notice no symptoms from this.  The pill is gone from your body in a few days (during which you should stay away from other people), but takes several months to work.  Therefore, please return here approximately 6-8 weeks after the treatment.  This treatment has been available for many years, and the only known side-effect is an underactive thyroid.  It is possible that i would eventually prescribe for you a thyroid hormone pill, which is very inexpensive.  You don't have to worry about side-effects of this thyroid hormone pill, because it is the same molecule your thyroid makes. (update: i left message on phone-tree:  Come back for bx)

## 2010-11-14 NOTE — Progress Notes (Signed)
Subjective:    Patient ID: Monica Lee, female    DOB: 1950-07-15, 60 y.o.   MRN: [id number]  HPI Pt returns for f/u of hyperthyroidism, due to multinodular goiter,  sxs are unchanged. Past Medical History  Diagnosis Date  . CHF (congestive heart failure)   . Hypertension   . Diabetes mellitus   . Asthma   . Acid reflux   . Kidney [place]   . S/P cataract surgery   . Hx of cholecystectomy   . S/P kidney transplant   . MI (myocardial infarction)   . CAD (coronary artery disease)     native vessel  . Nephrolithiasis   . GERD (gastroesophageal reflux disease)   . Diastolic dysfunction   . Hyperlipidemia   . Anemia   . DJD (degenerative joint disease)   . TIA (transient ischemic attack)   . Hx of colonic polyps   . Hx of colonoscopy   . PUD (peptic ulcer disease)   . Blind left eye   . Diabetic retinopathy     Past Surgical History  Procedure Date  . Transplantation renal   . Cardiac catheterization   . Abdominal hysterectomy   . Cholecystectomy   . Cataract extraction   . Intestinal perforation surgery 01/2009  . Breast biopsy 2010    History   Social History  . Marital Status: Married    Spouse Name: N/A    Number of Children: 4  . Years of Education: N/A   Occupational History  . Disables/SSI since 2002    Social History Main Topics  . Smoking status: Never Smoker   . Smokeless tobacco: Not on file  . Alcohol Use: No  . Drug Use: No  . Sexually Active: Not on file   Other Topics Concern  . Not on file   Social History Narrative  . No narrative on file    Current Outpatient Prescriptions on File Prior to Visit  Medication Sig Dispense Refill  . amLODipine (NORVASC) 10 MG tablet TAKE ONE TABLET BY MOUTH EVERY DAY  90 tablet  2  . aspirin EC 325 MG tablet Take 325 mg by mouth daily.        Marland Kitchen azaTHIOprine (IMURAN) 50 MG tablet Take 50 mg by mouth 2 (two) times daily.        . brimonidine (ALPHAGAN) 0.2 % ophthalmic solution Place 1 drop into both  eyes 2 (two) times daily.        . brimonidine-timolol (COMBIGAN) 0.2-0.5 % ophthalmic solution Place 1 drop into both eyes 2 (two) times daily.        . butalbital-acetaminophen-caffeine (FIORICET, ESGIC) 50-325-40 MG per tablet Take 1 tablet by mouth every 6 (six) hours as needed.        . calcitRIOL (ROCALTROL) 0.25 MCG capsule Take 0.25 mcg by mouth daily.        . cloNIDine (CATAPRES) 0.2 MG tablet Take 0.2 mg by mouth 3 (three) times daily.       . hydrALAZINE (APRESOLINE) 50 MG tablet Take 1 tablet (50 mg total) by mouth 3 (three) times daily.  90 tablet  3  . insulin aspart (NOVOLOG FLEXPEN) 100 UNIT/ML injection Use as directed three times daily with meals a - 16 - 20 - 20  50 mL  12  . insulin glargine (LANTUS) 100 UNIT/ML injection Inject 55 Units into the skin daily.  30 mL  12  . isosorbide mononitrate (IMDUR) 60 MG 24 hr tablet Take 60 mg by mouth  daily.        . Magnesium Oxide 250 MG TABS Take 1 tablet by mouth 2 (two) times daily.        . metoprolol (LOPRESSOR) 100 MG tablet TAKE ONE & ONE-HALF TABLETS BY MOUTH TWICE DAILY  90 tablet  3  . minoxidil (LONITEN) 2.5 MG tablet Take 2.5 mg by mouth 2 (two) times daily.        . Multiple Vitamin (MULTIVITAMIN) capsule Take 1 capsule by mouth daily.        Marland Kitchen NEXIUM 40 MG capsule TAKE ONE CAPSULE BY MOUTH EVERY DAY  90 each  3  . nitroGLYCERIN (NITROSTAT) 0.4 MG SL tablet Place 1 tablet (0.4 mg total) under the tongue every 5 (five) minutes as needed for chest pain.  25 tablet  12  . polyethylene glycol powder (MIRALAX) powder Take 17 g by mouth daily.  850 g  11  . potassium chloride SA (K-DUR,KLOR-CON) 20 MEQ tablet Take 20 mEq by mouth daily. Take an extra tablet for fluid weight gain of 3 pounds or more daily as needed.      . pravastatin (PRAVACHOL) 20 MG tablet Take 10 mg by mouth at bedtime.        . predniSONE (DELTASONE) 10 MG tablet TAKE ONE TABLET BY MOUTH EVERY DAY  90 tablet  3  . tacrolimus (PROGRAF) 1 MG capsule Take 1 mg  by mouth 2 (two) times daily.       . timolol (TIMOPTIC) 0.25 % ophthalmic solution Place 1 drop into both eyes 2 (two) times daily.        Marland Kitchen triamcinolone (KENALOG) 0.1 % cream Apply topically 2 (two) times daily.  30 g  0    Allergies  Allergen Reactions  . Cephalexin     REACTION: edema/face  . Propoxyphene N-Acetaminophen     Family History  Problem Relation Age of Onset  . Kidney disease Neg Hx   . Arthritis Other     parent  . Heart disease Other     parent  . Hypertension Other     parent  . Diabetes Other     parent  . Diabetes Other     grandparent  . Thyroid disease Other     several    BP 136/88  Pulse 72  Temp(Src) 98.2 F (36.8 C) (Oral)  Ht 5\' 5"  (1.651 m)  Wt 194 lb 8 oz (88.225 kg)  BMI 32.37 kg/m2  SpO2 98%    Review of Systems No weight change    Objective:   Physical Exam VITAL SIGNS:  See vs page GENERAL: no distress Neck: fullness at the right side of the thyroid is unchanged  ULTRASOUND OF HEAD/NECK SOFT TISSUES  IMPRESSION:  1. The thyroid gland is nearly completely replaced by a complex,  septated cystic nodules.  2. Dominant septated cyst is in the right lobe of the thyroid  gland and contains a solid appearing nodule containing blood flow  and microcalcification. I would suggest percutaneous sampling of  this dominant nodule to rule out underlying malignancy.      Assessment & Plan:  Multinodular goiter, which is usually hereditary.  Malignancy is unlikely, but the probability should be further reduced by bx.   Hyperthyroidism, due to the goiter, mild

## 2010-11-14 NOTE — Telephone Encounter (Signed)
Appointment scheduled 11/14/2010.

## 2010-11-17 DIAGNOSIS — E042 Nontoxic multinodular goiter: Secondary | ICD-10-CM | POA: Insufficient documentation

## 2010-11-23 ENCOUNTER — Encounter: Payer: Self-pay | Admitting: Endocrinology

## 2010-11-23 ENCOUNTER — Other Ambulatory Visit (HOSPITAL_COMMUNITY)
Admission: RE | Admit: 2010-11-23 | Discharge: 2010-11-23 | Disposition: A | Discharge status: Home / Self Care | Payer: Medicare Other | Source: Ambulatory Visit | Attending: Endocrinology | Admitting: Endocrinology

## 2010-11-23 ENCOUNTER — Ambulatory Visit (INDEPENDENT_AMBULATORY_CARE_PROVIDER_SITE_OTHER): Payer: Medicare Other | Admitting: Endocrinology

## 2010-11-23 VITALS — BP 136/82 | HR 79 | Temp 98.5°F | Ht 69.0 in | Wt 194.0 lb

## 2010-11-23 DIAGNOSIS — E041 Nontoxic single thyroid nodule: Secondary | ICD-10-CM | POA: Insufficient documentation

## 2010-11-23 DIAGNOSIS — E049 Nontoxic goiter, unspecified: Secondary | ICD-10-CM

## 2010-11-23 NOTE — Progress Notes (Signed)
Addended by: Carin Primrose on: 11/23/2010 03:34 PM   Modules accepted: Orders

## 2010-11-23 NOTE — Progress Notes (Signed)
  Subjective:    Patient ID: Monica Lee, female    DOB: 06-15-1950, 60 y.o.   MRN: [id number]  HPI thyroid needle bx: consent obtained, signed form on chart.  Pt denies allergy to local anesthetics. Location of mass (right lobe) is confirmed by ultrasound. local: xylocaine 2%, with epi prep: betadine 2bxs are done (1 each with 25 and 27g needles). 3 cc of dark brown fluid is obtained.   no complications   Review of Systems     Objective:   Physical Exam        Assessment & Plan:

## 2010-11-23 NOTE — Patient Instructions (Signed)
Net week, please call [phone] to hear your biopsy results.  You will be prompted to enter the 9-digit "MRN" number that appears at the top left of this page, followed by #.  Then you will hear the message. If as expected, no cancer is seen, i'll order your radioactive iodine therapy.   Please return here 6-8 weeks later.

## 2010-11-27 ENCOUNTER — Other Ambulatory Visit: Payer: Self-pay | Admitting: Endocrinology

## 2010-11-27 DIAGNOSIS — E0590 Thyrotoxicosis, unspecified without thyrotoxic crisis or storm: Secondary | ICD-10-CM

## 2010-11-28 ENCOUNTER — Other Ambulatory Visit: Payer: Self-pay | Admitting: Internal Medicine

## 2010-12-03 ENCOUNTER — Other Ambulatory Visit: Payer: Self-pay | Admitting: Physician Assistant

## 2010-12-11 ENCOUNTER — Ambulatory Visit (HOSPITAL_COMMUNITY): Payer: Medicare Other

## 2010-12-29 ENCOUNTER — Encounter: Payer: Self-pay | Admitting: Internal Medicine

## 2010-12-29 DIAGNOSIS — Z0000 Encounter for general adult medical examination without abnormal findings: Secondary | ICD-10-CM | POA: Insufficient documentation

## 2011-01-01 ENCOUNTER — Ambulatory Visit (INDEPENDENT_AMBULATORY_CARE_PROVIDER_SITE_OTHER): Payer: Medicare Other | Admitting: Internal Medicine

## 2011-01-01 ENCOUNTER — Other Ambulatory Visit: Payer: Self-pay | Admitting: Internal Medicine

## 2011-01-01 ENCOUNTER — Other Ambulatory Visit (INDEPENDENT_AMBULATORY_CARE_PROVIDER_SITE_OTHER): Payer: Medicare Other

## 2011-01-01 ENCOUNTER — Encounter: Payer: Self-pay | Admitting: Internal Medicine

## 2011-01-01 VITALS — BP 110/68 | HR 71 | Temp 98.4°F | Ht 65.0 in | Wt 193.1 lb

## 2011-01-01 DIAGNOSIS — I10 Essential (primary) hypertension: Secondary | ICD-10-CM

## 2011-01-01 DIAGNOSIS — E785 Hyperlipidemia, unspecified: Secondary | ICD-10-CM

## 2011-01-01 DIAGNOSIS — M545 Low back pain: Secondary | ICD-10-CM

## 2011-01-01 DIAGNOSIS — E119 Type 2 diabetes mellitus without complications: Secondary | ICD-10-CM

## 2011-01-01 LAB — BASIC METABOLIC PANEL
CO2: 29 mEq/L (ref 19–32)
Calcium: 9.2 mg/dL (ref 8.4–10.5)
Creatinine, Ser: 1.2 mg/dL (ref 0.4–1.2)

## 2011-01-01 LAB — LIPID PANEL
Cholesterol: 113 mg/dL (ref 0–200)
Total CHOL/HDL Ratio: 3
Triglycerides: 216 mg/dL — ABNORMAL HIGH (ref 0.0–149.0)
VLDL: 43.2 mg/dL — ABNORMAL HIGH (ref 0.0–40.0)

## 2011-01-01 LAB — LDL CHOLESTEROL, DIRECT: Direct LDL: 40.1 mg/dL

## 2011-01-01 NOTE — Patient Instructions (Signed)
Continue all other medications as before Please go to LAB in the Basement for the blood and/or urine tests to be done today Please call the phone number [phone] (the [employer] System) for results of testing in 2-3 days;  When calling, simply dial the number, and when prompted enter the MRN number above (the Medical Record Number) and the # key, then the message should start. Please return in 6 months, or sooner if needed 

## 2011-01-02 ENCOUNTER — Other Ambulatory Visit: Payer: Self-pay | Admitting: Internal Medicine

## 2011-01-02 MED ORDER — INSULIN GLARGINE 100 UNIT/ML ~~LOC~~ SOLN: 65.0000 [IU] | Freq: Every day | SUBCUTANEOUS | Status: DC

## 2011-01-06 ENCOUNTER — Encounter: Payer: Self-pay | Admitting: Internal Medicine

## 2011-01-06 NOTE — Assessment & Plan Note (Signed)
stable overall by hx and exam, most recent data reviewed with pt, and pt to continue medical treatment as before  Lab Results  Component Value Date   LDLCALC 68 09/04/2010   Goal ldl < 70

## 2011-01-06 NOTE — Assessment & Plan Note (Signed)
stable overall by hx and exam, most recent data reviewed with pt, and pt to continue medical treatment as before, goal a1c < 7, cont diet and wt control Lab Results  Component Value Date   HGBA1C 8.5* 01/01/2011

## 2011-01-06 NOTE — Progress Notes (Signed)
Subjective:    Patient ID: Monica Lee, female    DOB: [birth year], 60 y.o.   MRN: [id number]  HPI  Here to f/u; overall doing ok,  Pt denies chest pain, increased sob or doe, wheezing, orthopnea, PND, increased LE swelling, palpitations, dizziness or syncope.  Pt denies new neurological symptoms such as new headache, or facial or extremity weakness or numbness   Pt denies polydipsia, polyuria, or low sugar symptoms such as weakness or confusion improved with po intake.  Pt states overall good compliance with meds, trying to follow lower cholesterol, diabetic diet, wt overall stable but little exercise however.  Pt continues to have recurring LBP without change in severity, bowel or bladder change, fever, wt loss,  worsening LE pain/numbness/weakness, gait change or falls. Past Medical History  Diagnosis Date  . CHF (congestive heart failure)   . Hypertension   . Diabetes mellitus   . Asthma   . Acid reflux   . Kidney [place]   . S/P cataract surgery   . Hx of cholecystectomy   . S/P kidney transplant   . MI (myocardial infarction)   . CAD (coronary artery disease)     native vessel  . Nephrolithiasis   . GERD (gastroesophageal reflux disease)   . Diastolic dysfunction   . Hyperlipidemia   . Anemia   . DJD (degenerative joint disease)   . TIA (transient ischemic attack)   . Hx of colonic polyps   . Hx of colonoscopy   . PUD (peptic ulcer disease)   . Blind left eye   . Diabetic retinopathy    Past Surgical History  Procedure Date  . Transplantation renal   . Cardiac catheterization   . Abdominal hysterectomy   . Cholecystectomy   . Cataract extraction   . Intestinal perforation surgery 01/2009  . Breast biopsy 2010    reports that she has never smoked. She does not have any smokeless tobacco history on file. She reports that she does not drink alcohol or use illicit drugs. family history includes Arthritis in her other; Diabetes in her others; Heart disease in her other;  Hypertension in her other; and Thyroid disease in her other.  There is no history of Kidney disease. Allergies  Allergen Reactions  . Cephalexin     REACTION: edema/face  . Propoxyphene N-Acetaminophen    Current Outpatient Prescriptions on File Prior to Visit  Medication Sig Dispense Refill  . amLODipine (NORVASC) 10 MG tablet TAKE ONE TABLET BY MOUTH EVERY DAY  90 tablet  2  . aspirin EC 325 MG tablet Take 325 mg by mouth daily.        Marland Kitchen azaTHIOprine (IMURAN) 50 MG tablet Take 50 mg by mouth 2 (two) times daily.        . brimonidine (ALPHAGAN) 0.2 % ophthalmic solution Place 1 drop into both eyes 2 (two) times daily.        . brimonidine-timolol (COMBIGAN) 0.2-0.5 % ophthalmic solution Place 1 drop into both eyes 2 (two) times daily.        . calcitRIOL (ROCALTROL) 0.25 MCG capsule Take 0.25 mcg by mouth daily.        . cloNIDine (CATAPRES) 0.2 MG tablet Take 0.2 mg by mouth 3 (three) times daily.       . furosemide (LASIX) 80 MG tablet 2 tablets by mouth every afternoon       . insulin aspart (NOVOLOG FLEXPEN) 100 UNIT/ML injection Use as directed three times daily with meals a -  16 - 20 - 20  50 mL  12  . isosorbide mononitrate (IMDUR) 60 MG 24 hr tablet TAKE ONE TABLET BY MOUTH EVERY DAY  90 tablet  1  . Magnesium Oxide 250 MG TABS Take 1 tablet by mouth 2 (two) times daily.        . metoprolol (LOPRESSOR) 100 MG tablet TAKE ONE & ONE-HALF TABLETS BY MOUTH TWICE DAILY  90 tablet  3  . minoxidil (LONITEN) 2.5 MG tablet TAKE ONE TABLET BY MOUTH TWICE DAILY  60 tablet  2  . Multiple Vitamin (MULTIVITAMIN) capsule Take 1 capsule by mouth daily.        Marland Kitchen NEXIUM 40 MG capsule TAKE ONE CAPSULE BY MOUTH EVERY DAY  90 each  3  . nitroGLYCERIN (NITROSTAT) 0.4 MG SL tablet Place 1 tablet (0.4 mg total) under the tongue every 5 (five) minutes as needed for chest pain.  25 tablet  12  . polyethylene glycol powder (MIRALAX) powder Take 17 g by mouth daily.  850 g  11  . potassium chloride SA  (K-DUR,KLOR-CON) 20 MEQ tablet Take 20 mEq by mouth daily. Take an extra tablet for fluid weight gain of 3 pounds or more daily as needed.      . pravastatin (PRAVACHOL) 20 MG tablet Take 10 mg by mouth at bedtime.        . predniSONE (DELTASONE) 10 MG tablet TAKE ONE TABLET BY MOUTH EVERY DAY  90 tablet  3  . tacrolimus (PROGRAF) 1 MG capsule Take 1 mg by mouth 2 (two) times daily.       . timolol (TIMOPTIC) 0.25 % ophthalmic solution Place 1 drop into both eyes 2 (two) times daily.        Marland Kitchen triamcinolone (KENALOG) 0.1 % cream Apply topically 2 (two) times daily.  30 g  0  . butalbital-acetaminophen-caffeine (FIORICET, ESGIC) 50-325-40 MG per tablet Take 1 tablet by mouth every 6 (six) hours as needed.        . hydrALAZINE (APRESOLINE) 50 MG tablet Take 1 tablet (50 mg total) by mouth 3 (three) times daily.  90 tablet  3   Review of Systems Review of Systems  Constitutional: Negative for diaphoresis and unexpected weight change.  HENT: Negative for drooling and tinnitus.   Eyes: Negative for photophobia and visual disturbance.  Respiratory: Negative for choking and stridor.   Gastrointestinal: Negative for vomiting and blood in stool.  Genitourinary: Negative for hematuria and decreased urine volume.     Objective:   Physical Exam BP 110/68  Pulse 71  Temp(Src) 98.4 F (36.9 C) (Oral)  Ht 5\' 5"  (1.651 m)  Wt 193 lb 2 oz (87.601 kg)  BMI 32.14 kg/m2  SpO2 94% Physical Exam  VS noted Constitutional: Pt appears well-developed and well-nourished.  HENT: Head: Normocephalic.  Right Ear: External ear normal.  Left Ear: External ear normal.  Eyes: Conjunctivae and EOM are normal. Pupils are equal, round, and reactive to light.  Neck: Normal range of motion. Neck supple.  Cardiovascular: Normal rate and regular rhythm.   Pulmonary/Chest: Effort normal and breath sounds normal.  Neurological: Pt is alert. No cranial nerve deficit. motor/dtr/gait intact Spine nontender Skin: Skin is  warm. No erythema.  Psychiatric: Pt behavior is normal. Thought content normal.     Assessment & Plan:

## 2011-01-06 NOTE — Assessment & Plan Note (Signed)
stable overall by hx and exam, most recent data reviewed with pt, and pt to continue medical treatment as before  Lab Results  Component Value Date   WBC 5.1 09/05/2010   HGB 11.1* 09/05/2010   HCT 34.0* 09/05/2010   PLT 213 09/05/2010   GLUCOSE 128* 01/01/2011   CHOL 113 01/01/2011   TRIG 216.0* 01/01/2011   HDL 37.90* 01/01/2011   LDLDIRECT 40.1 01/01/2011   LDLCALC 68 09/04/2010   ALT 12 09/03/2010   ALT 13 09/03/2010   AST 22 09/03/2010   AST 20 09/03/2010   NA 142 01/01/2011   K 3.9 01/01/2011   CL 105 01/01/2011   CREATININE 1.2 01/01/2011   BUN 22 01/01/2011   CO2 29 01/01/2011   TSH 0.72 10/22/2010   INR 1.02 09/03/2010   HGBA1C 8.5* 01/01/2011

## 2011-01-06 NOTE — Assessment & Plan Note (Signed)
stable overall by hx and exam, most recent data reviewed with pt, and pt to continue medical treatment as before  BP Readings from Last 3 Encounters:  01/01/11 110/68  11/23/10 136/82  11/14/10 136/88

## 2011-01-15 HISTORY — PX: FOOT SURGERY: SHX648

## 2011-01-21 ENCOUNTER — Other Ambulatory Visit: Payer: Self-pay | Admitting: Cardiology

## 2011-01-22 ENCOUNTER — Ambulatory Visit (AMBULATORY_SURGERY_CENTER): Payer: Medicare Other

## 2011-01-22 VITALS — Ht 65.0 in | Wt 192.2 lb

## 2011-01-22 DIAGNOSIS — Z86010 Personal history of colonic polyps: Secondary | ICD-10-CM

## 2011-01-22 DIAGNOSIS — Z1211 Encounter for screening for malignant neoplasm of colon: Secondary | ICD-10-CM

## 2011-01-22 DIAGNOSIS — Z79899 Other long term (current) drug therapy: Secondary | ICD-10-CM | POA: Diagnosis not present

## 2011-01-22 DIAGNOSIS — E213 Hyperparathyroidism, unspecified: Secondary | ICD-10-CM | POA: Diagnosis not present

## 2011-01-22 DIAGNOSIS — Z940 Kidney transplant status: Secondary | ICD-10-CM | POA: Diagnosis not present

## 2011-01-22 MED ORDER — PEG-KCL-NACL-NASULF-NA ASC-C 100 G PO SOLR: 1.0000 | Freq: Once | ORAL | Status: AC

## 2011-01-22 NOTE — Progress Notes (Signed)
Pt came into office for her pre-visit today prior to her colonoscopy on 02/05/11 with Dr Juanda Chance.Pt states she had a colonoscopy in 2006 with Dr Halina Maidens in [place], [place]. Pt signed a medical release form which will be given to [place], CMA. Ulis Rias RN

## 2011-02-04 ENCOUNTER — Other Ambulatory Visit: Payer: Self-pay | Admitting: Internal Medicine

## 2011-02-05 ENCOUNTER — Ambulatory Visit (AMBULATORY_SURGERY_CENTER): Payer: Medicare Other | Admitting: Internal Medicine

## 2011-02-05 ENCOUNTER — Other Ambulatory Visit: Payer: Self-pay | Admitting: Cardiology

## 2011-02-05 ENCOUNTER — Encounter: Payer: Self-pay | Admitting: Internal Medicine

## 2011-02-05 VITALS — BP 156/72 | HR 60 | Temp 97.7°F | Resp 19 | Ht 65.0 in | Wt 192.0 lb

## 2011-02-05 DIAGNOSIS — Z86010 Personal history of colonic polyps: Secondary | ICD-10-CM

## 2011-02-05 DIAGNOSIS — Z1211 Encounter for screening for malignant neoplasm of colon: Secondary | ICD-10-CM | POA: Diagnosis not present

## 2011-02-05 DIAGNOSIS — D126 Benign neoplasm of colon, unspecified: Secondary | ICD-10-CM

## 2011-02-05 LAB — GLUCOSE, CAPILLARY: Glucose-Capillary: 159 mg/dL — ABNORMAL HIGH (ref 70–99)

## 2011-02-05 MED ORDER — DEXTROSE 5 % IV SOLN: INTRAVENOUS | Status: DC

## 2011-02-05 NOTE — Patient Instructions (Signed)
Please refer to blue and green discharge instructions and hand-outs.

## 2011-02-05 NOTE — Progress Notes (Signed)
O2 sat decrease physician made aware O2 increased and head tilt chin lift applied O2 Increased from 84% to 94%. O2 Decreased from 4L back to 2L pt. Able to maintain oxygen level at 2L. Pressure applied to abdomen to reach cecum. Pt. Tolerated without difficulty.

## 2011-02-05 NOTE — Progress Notes (Signed)
Patient did not have preoperative order for IV antibiotic SSI prophylaxis. (G8918)  Patient did not experience any of the following events: a burn prior to discharge; a fall within the facility; wrong site/side/patient/procedure/implant event; or a hospital transfer or hospital admission upon discharge from the facility. (G8907)  

## 2011-02-05 NOTE — Op Note (Signed)
[facility] [address] [place], [place]  [address]  COLONOSCOPY PROCEDURE REPORT  PATIENT:  Monica Lee, Monica Lee  MR#:  [id number] BIRTHDATE:  [birth year], 60 yrs. old  GENDER:  female ENDOSCOPIST:  Hedwig Morton. Juanda Chance, MD REF. BY:  Oliver Barre, M.D. PROCEDURE DATE:  02/05/2011 PROCEDURE:  Colonoscopy with biopsy and snare polypectomy ASA CLASS:  Class II INDICATIONS:  Routine Risk Screening hx colon polyp 2005 MEDICATIONS:   These medications were titrated to patient response per physician's verbal order, Versed 6 mg, Fentanyl 50 mcg  DESCRIPTION OF PROCEDURE:   After the risks and benefits and of the procedure were explained, informed consent was obtained. Digital rectal exam was performed and revealed no rectal masses. The LB [id number] endoscope was introduced through the anus and advanced to the cecum, which was identified by both the appendix and ileocecal valve.  The quality of the prep was good, using MoviPrep.  The instrument was then slowly withdrawn as the colon was fully examined. <<PROCEDUREIMAGES>>  FINDINGS:  Two polyps were found. at 120 cm close to cecum 6 mmpedunculated polyp, at 20 cm 3 mm flat polyp The polyp was removed using cold biopsy forceps. Polyp was snared without cautery. Retrieval was successful (see image2, image4, image5, and image7). snare polyp  Mild diverticulosis was found in the sigmoid colon (see image1).  This was otherwise a normal examination of the colon (see image6, image5, image4, and image8).   Retroflexed views in the rectum revealed no abnormalities.    The scope was then withdrawn from the patient and the procedure completed.  COMPLICATIONS:  None ENDOSCOPIC IMPRESSION: 1) Two polyps 2) Mild diverticulosis in the sigmoid colon 3) Otherwise normal examination RECOMMENDATIONS: 1) Await pathology results 2) High fiber diet.  REPEAT EXAM:  In 5 year(s) for.  ______________________________ Hedwig Morton. Juanda Chance,  MD  CC:  n. eSIGNED:   Hedwig Morton. Abdalrahman Clementson at 02/05/2011 08:42 AM  Modena Slater, [phone]

## 2011-02-06 ENCOUNTER — Telehealth: Payer: Self-pay | Admitting: *Deleted

## 2011-02-06 NOTE — Telephone Encounter (Signed)
Follow up Call- Patient questions:  Do you have a fever, pain , or abdominal swelling? no Pain Score  0 *  Have you tolerated food without any problems? yes  Have you been able to return to your normal activities? yes  Do you have any questions about your discharge instructions: Diet   yes Medications  yes Follow up visit  yes  Do you have questions or concerns about your Care? no  Actions: * If pain score is 4 or above: No action needed, pain <4.   

## 2011-02-12 ENCOUNTER — Encounter: Payer: Self-pay | Admitting: Internal Medicine

## 2011-02-26 DIAGNOSIS — Z940 Kidney transplant status: Secondary | ICD-10-CM | POA: Diagnosis not present

## 2011-02-26 DIAGNOSIS — Z79899 Other long term (current) drug therapy: Secondary | ICD-10-CM | POA: Diagnosis not present

## 2011-02-26 DIAGNOSIS — E213 Hyperparathyroidism, unspecified: Secondary | ICD-10-CM | POA: Diagnosis not present

## 2011-03-07 DIAGNOSIS — E1139 Type 2 diabetes mellitus with other diabetic ophthalmic complication: Secondary | ICD-10-CM | POA: Diagnosis not present

## 2011-03-07 DIAGNOSIS — H2510 Age-related nuclear cataract, unspecified eye: Secondary | ICD-10-CM | POA: Diagnosis not present

## 2011-03-07 DIAGNOSIS — H35379 Puckering of macula, unspecified eye: Secondary | ICD-10-CM | POA: Diagnosis not present

## 2011-03-07 DIAGNOSIS — E11359 Type 2 diabetes mellitus with proliferative diabetic retinopathy without macular edema: Secondary | ICD-10-CM | POA: Diagnosis not present

## 2011-03-07 DIAGNOSIS — Z940 Kidney transplant status: Secondary | ICD-10-CM | POA: Diagnosis not present

## 2011-03-07 DIAGNOSIS — H31009 Unspecified chorioretinal scars, unspecified eye: Secondary | ICD-10-CM | POA: Diagnosis not present

## 2011-03-14 DIAGNOSIS — E119 Type 2 diabetes mellitus without complications: Secondary | ICD-10-CM | POA: Diagnosis not present

## 2011-03-14 DIAGNOSIS — N2581 Secondary hyperparathyroidism of renal origin: Secondary | ICD-10-CM | POA: Diagnosis not present

## 2011-03-14 DIAGNOSIS — I120 Hypertensive chronic kidney disease with stage 5 chronic kidney disease or end stage renal disease: Secondary | ICD-10-CM | POA: Diagnosis not present

## 2011-03-14 DIAGNOSIS — Z940 Kidney transplant status: Secondary | ICD-10-CM | POA: Diagnosis not present

## 2011-03-16 ENCOUNTER — Other Ambulatory Visit: Payer: Self-pay | Admitting: Internal Medicine

## 2011-03-19 ENCOUNTER — Ambulatory Visit (INDEPENDENT_AMBULATORY_CARE_PROVIDER_SITE_OTHER): Payer: Medicare Other | Admitting: Endocrinology

## 2011-03-19 ENCOUNTER — Ambulatory Visit: Payer: Medicare Other | Admitting: Internal Medicine

## 2011-03-19 DIAGNOSIS — E0590 Thyrotoxicosis, unspecified without thyrotoxic crisis or storm: Secondary | ICD-10-CM

## 2011-03-19 DIAGNOSIS — N2581 Secondary hyperparathyroidism of renal origin: Secondary | ICD-10-CM | POA: Diagnosis not present

## 2011-03-19 DIAGNOSIS — Z940 Kidney transplant status: Secondary | ICD-10-CM | POA: Diagnosis not present

## 2011-03-19 DIAGNOSIS — I120 Hypertensive chronic kidney disease with stage 5 chronic kidney disease or end stage renal disease: Secondary | ICD-10-CM | POA: Diagnosis not present

## 2011-03-19 NOTE — Patient Instructions (Addendum)
i have ordered for you a treatment pill of radioactive iodine.  Although it is a larger amount of radiation, you will again notice no symptoms from this.  The pill is gone from your body in a few days (during which you should stay away from other people), but takes several months to work.  Therefore, please return here approximately 6-8 weeks after the treatment.  This treatment has been available for many years, and the only known side-effect is an underactive thyroid.  It is possible that i would eventually prescribe for you a thyroid hormone pill, which is very inexpensive.  You don't have to worry about side-effects of this thyroid hormone pill, because it is the same molecule your thyroid makes.  

## 2011-03-19 NOTE — Progress Notes (Signed)
Subjective:    Patient ID: Monica Lee, female    DOB: 08-05-1950, 61 y.o.   MRN: [id number]  HPI Pt returns for f/u of hyperthyroidism, due to multinodular goiter.  pcc says pt canceled her i-131 rx twice, but pt says she never had a appointment for it.  pt states she feels well in general. Past Medical History  Diagnosis Date  . CHF (congestive heart failure)   . Hypertension   . Diabetes mellitus   . Asthma   . Acid reflux   . Kidney [place]   . S/P cataract surgery   . Hx of cholecystectomy   . S/P kidney transplant   . MI (myocardial infarction)   . CAD (coronary artery disease)     native vessel  . Nephrolithiasis   . GERD (gastroesophageal reflux disease)   . Diastolic dysfunction   . Hyperlipidemia   . Anemia   . DJD (degenerative joint disease)   . TIA (transient ischemic attack)   . Hx of colonic polyps   . Hx of colonoscopy   . PUD (peptic ulcer disease)   . Blind left eye   . Diabetic retinopathy   . Stroke   . Dialysis patient   . Dialysis patient     hx of dialysis    Past Surgical History  Procedure Date  . Transplantation renal   . Cardiac catheterization   . Abdominal hysterectomy   . Cholecystectomy   . Cataract extraction bil  . Intestinal perforation surgery 01/2009  . Breast biopsy 2010  . Gallstones removed     History   Social History  . Marital Status: Married    Spouse Name: N/A    Number of Children: 4  . Years of Education: N/A   Occupational History  . Disables/SSI since 2002    Social History Main Topics  . Smoking status: Never Smoker   . Smokeless tobacco: Never Used  . Alcohol Use: No  . Drug Use: No  . Sexually Active: Not on file   Other Topics Concern  . Not on file   Social History Narrative  . No narrative on file    Current Outpatient Prescriptions on File Prior to Visit  Medication Sig Dispense Refill  . amLODipine (NORVASC) 10 MG tablet TAKE ONE TABLET BY MOUTH EVERY DAY  90 tablet  2  . aspirin EC 325  MG tablet Take 325 mg by mouth daily.        Marland Kitchen azaTHIOprine (IMURAN) 50 MG tablet Take 50 mg by mouth 2 (two) times daily.        . brimonidine (ALPHAGAN) 0.2 % ophthalmic solution Place 1 drop into both eyes 2 (two) times daily.        . brimonidine-timolol (COMBIGAN) 0.2-0.5 % ophthalmic solution Place 1 drop into both eyes 2 (two) times daily.        . butalbital-acetaminophen-caffeine (FIORICET, ESGIC) 50-325-40 MG per tablet Take 1 tablet by mouth every 6 (six) hours as needed.        . calcitRIOL (ROCALTROL) 0.25 MCG capsule Take 0.25 mcg by mouth daily. Take 2 tabs daily      . cloNIDine (CATAPRES) 0.2 MG tablet Take 0.2 mg by mouth 2 (two) times daily.       . furosemide (LASIX) 80 MG tablet Take 2 tablets bid      . hydrALAZINE (APRESOLINE) 50 MG tablet Take 1 tablet (50 mg total) by mouth 3 (three) times daily.  90 tablet  3  .  insulin aspart (NOVOLOG FLEXPEN) 100 UNIT/ML injection Use as directed three times daily with meals a - 16 - 20 - 20  50 mL  12  . insulin glargine (LANTUS) 100 UNIT/ML injection Inject 65 Units into the skin daily.  30 mL  12  . isosorbide mononitrate (IMDUR) 60 MG 24 hr tablet TAKE ONE TABLET BY MOUTH EVERY DAY  90 tablet  1  . KLOR-CON M20 20 MEQ tablet TAKE ONE TABLET BY MOUTH EVERY DAY,TAKE AN EXTRA TABLET FOR FLUID WEIGHT GAIN OF 3 POUNDS OR MORE DAILY AS NEEDED  60 each  3  . Magnesium Oxide 250 MG TABS Take 1 tablet by mouth daily.       . metoprolol (LOPRESSOR) 100 MG tablet TAKE ONE & ONE-HALF TABLETS BY MOUTH TWICE DAILY  90 tablet  8  . minoxidil (LONITEN) 2.5 MG tablet TAKE ONE TABLET BY MOUTH TWICE DAILY  60 tablet  1  . Multiple Vitamin (MULTIVITAMIN) capsule Take 1 capsule by mouth daily.        Marland Kitchen NEXIUM 40 MG capsule TAKE ONE CAPSULE BY MOUTH EVERY DAY  90 each  3  . nitroGLYCERIN (NITROSTAT) 0.4 MG SL tablet Place 1 tablet (0.4 mg total) under the tongue every 5 (five) minutes as needed for chest pain.  25 tablet  12  . polyethylene glycol powder  (MIRALAX) powder Take 17 g by mouth daily.  850 g  11  . pravastatin (PRAVACHOL) 20 MG tablet Take 10 mg by mouth at bedtime.        . predniSONE (DELTASONE) 10 MG tablet        . predniSONE (DELTASONE) 10 MG tablet TAKE ONE TABLET BY MOUTH EVERY DAY  90 tablet  3  . tacrolimus (PROGRAF) 1 MG capsule Take by mouth. Take 8 mg in am and 7 mg in pm      . timolol (TIMOPTIC) 0.25 % ophthalmic solution Place 1 drop into the right eye 2 (two) times daily.       Marland Kitchen triamcinolone (KENALOG) 0.1 % cream Apply topically 2 (two) times daily.  30 g  0    Allergies  Allergen Reactions  . Cephalexin     REACTION: edema/face  . Propoxyphene N-Acetaminophen     Family History  Problem Relation Age of Onset  . Kidney disease Neg Hx   . Arthritis Other     parent  . Heart disease Other     parent  . Hypertension Other     parent  . Diabetes Other     parent  . Diabetes Other     grandparent  . Thyroid disease Other     several  . Heart disease Father     There were no vitals taken for this visit.    Review of Systems Denies weight change    Objective:   Physical Exam VITAL SIGNS:  See vs page GENERAL: no distress Neck: there is a goiter 10x normal size, left > right, with multinodular surface.        Assessment & Plan:  Hyperthyroidism, i-131 rx is needed

## 2011-03-25 ENCOUNTER — Encounter (HOSPITAL_COMMUNITY)
Admission: RE | Admit: 2011-03-25 | Discharge: 2011-03-25 | Disposition: A | Discharge status: Home / Self Care | Payer: Medicare Other | Source: Ambulatory Visit | Attending: Endocrinology | Admitting: Endocrinology

## 2011-03-25 ENCOUNTER — Encounter (HOSPITAL_COMMUNITY): Payer: Self-pay

## 2011-03-25 DIAGNOSIS — E0590 Thyrotoxicosis, unspecified without thyrotoxic crisis or storm: Secondary | ICD-10-CM | POA: Insufficient documentation

## 2011-03-25 DIAGNOSIS — E042 Nontoxic multinodular goiter: Secondary | ICD-10-CM | POA: Diagnosis not present

## 2011-03-25 MED ORDER — SODIUM IODIDE I 131 CAPSULE: 41.2000 | Freq: Once | INTRAVENOUS | Status: AC | PRN

## 2011-03-29 DIAGNOSIS — Z940 Kidney transplant status: Secondary | ICD-10-CM | POA: Diagnosis not present

## 2011-03-29 DIAGNOSIS — I120 Hypertensive chronic kidney disease with stage 5 chronic kidney disease or end stage renal disease: Secondary | ICD-10-CM | POA: Diagnosis not present

## 2011-04-09 DIAGNOSIS — M79609 Pain in unspecified limb: Secondary | ICD-10-CM | POA: Diagnosis not present

## 2011-04-09 DIAGNOSIS — B351 Tinea unguium: Secondary | ICD-10-CM | POA: Diagnosis not present

## 2011-04-09 DIAGNOSIS — E1059 Type 1 diabetes mellitus with other circulatory complications: Secondary | ICD-10-CM | POA: Diagnosis not present

## 2011-04-09 DIAGNOSIS — L608 Other nail disorders: Secondary | ICD-10-CM | POA: Diagnosis not present

## 2011-04-09 DIAGNOSIS — L84 Corns and callosities: Secondary | ICD-10-CM | POA: Diagnosis not present

## 2011-04-09 DIAGNOSIS — I739 Peripheral vascular disease, unspecified: Secondary | ICD-10-CM | POA: Diagnosis not present

## 2011-04-17 ENCOUNTER — Telehealth: Payer: Self-pay | Admitting: Internal Medicine

## 2011-04-17 NOTE — Telephone Encounter (Signed)
Please send Anusol HC supp, #12, insert  1 qhs, 1 refill

## 2011-04-17 NOTE — Telephone Encounter (Signed)
Patient states she went out of town this weekend and the riding has caused her hemorrhoids to flare up. States she has bright, red blood on the tissue when she wipes. She does not have blood in stool and is not in pain. She is using her stool softeners and bowel movements as soft. Instructed patient in hemorrhoid care: sitz baths, tucs pads and to use baby wipes instead of toilet paper.

## 2011-04-18 MED ORDER — HYDROCORTISONE 2.5 % RE CREA: TOPICAL_CREAM | RECTAL | Status: DC

## 2011-04-18 NOTE — Telephone Encounter (Signed)
Addended by: Annett Fabian on: 04/18/2011 01:34 PM   Modules accepted: Orders

## 2011-04-18 NOTE — Telephone Encounter (Signed)
RX sent , I have left a message for the patient with Dr Regino Schultze instructions

## 2011-04-22 ENCOUNTER — Other Ambulatory Visit: Payer: Self-pay | Admitting: Nephrology

## 2011-04-22 DIAGNOSIS — Z1231 Encounter for screening mammogram for malignant neoplasm of breast: Secondary | ICD-10-CM

## 2011-04-30 ENCOUNTER — Ambulatory Visit
Admission: RE | Admit: 2011-04-30 | Discharge: 2011-04-30 | Disposition: A | Discharge status: Home / Self Care | Payer: Medicare Other | Source: Ambulatory Visit | Attending: Nephrology | Admitting: Nephrology

## 2011-04-30 DIAGNOSIS — Z940 Kidney transplant status: Secondary | ICD-10-CM | POA: Diagnosis not present

## 2011-04-30 DIAGNOSIS — E213 Hyperparathyroidism, unspecified: Secondary | ICD-10-CM | POA: Diagnosis not present

## 2011-04-30 DIAGNOSIS — Z79899 Other long term (current) drug therapy: Secondary | ICD-10-CM | POA: Diagnosis not present

## 2011-04-30 DIAGNOSIS — Z1231 Encounter for screening mammogram for malignant neoplasm of breast: Secondary | ICD-10-CM

## 2011-05-28 DIAGNOSIS — E213 Hyperparathyroidism, unspecified: Secondary | ICD-10-CM | POA: Diagnosis not present

## 2011-05-28 DIAGNOSIS — Z79899 Other long term (current) drug therapy: Secondary | ICD-10-CM | POA: Diagnosis not present

## 2011-05-28 DIAGNOSIS — Z940 Kidney transplant status: Secondary | ICD-10-CM | POA: Diagnosis not present

## 2011-05-31 ENCOUNTER — Other Ambulatory Visit: Payer: Self-pay | Admitting: Internal Medicine

## 2011-06-03 ENCOUNTER — Other Ambulatory Visit: Payer: Self-pay | Admitting: Cardiology

## 2011-06-04 DIAGNOSIS — Z940 Kidney transplant status: Secondary | ICD-10-CM | POA: Diagnosis not present

## 2011-06-04 DIAGNOSIS — I120 Hypertensive chronic kidney disease with stage 5 chronic kidney disease or end stage renal disease: Secondary | ICD-10-CM | POA: Diagnosis not present

## 2011-06-04 DIAGNOSIS — E669 Obesity, unspecified: Secondary | ICD-10-CM | POA: Diagnosis not present

## 2011-07-01 DIAGNOSIS — Z79899 Other long term (current) drug therapy: Secondary | ICD-10-CM | POA: Diagnosis not present

## 2011-07-01 DIAGNOSIS — Z940 Kidney transplant status: Secondary | ICD-10-CM | POA: Diagnosis not present

## 2011-07-01 DIAGNOSIS — E213 Hyperparathyroidism, unspecified: Secondary | ICD-10-CM | POA: Diagnosis not present

## 2011-07-04 DIAGNOSIS — E11359 Type 2 diabetes mellitus with proliferative diabetic retinopathy without macular edema: Secondary | ICD-10-CM | POA: Diagnosis not present

## 2011-07-04 DIAGNOSIS — H43819 Vitreous degeneration, unspecified eye: Secondary | ICD-10-CM | POA: Diagnosis not present

## 2011-07-04 DIAGNOSIS — E1165 Type 2 diabetes mellitus with hyperglycemia: Secondary | ICD-10-CM | POA: Diagnosis not present

## 2011-07-04 DIAGNOSIS — E1139 Type 2 diabetes mellitus with other diabetic ophthalmic complication: Secondary | ICD-10-CM | POA: Diagnosis not present

## 2011-07-04 DIAGNOSIS — H44529 Atrophy of globe, unspecified eye: Secondary | ICD-10-CM | POA: Diagnosis not present

## 2011-07-04 DIAGNOSIS — H25019 Cortical age-related cataract, unspecified eye: Secondary | ICD-10-CM | POA: Diagnosis not present

## 2011-07-16 ENCOUNTER — Ambulatory Visit (INDEPENDENT_AMBULATORY_CARE_PROVIDER_SITE_OTHER): Payer: Medicare Other | Admitting: Endocrinology

## 2011-07-16 ENCOUNTER — Encounter: Payer: Self-pay | Admitting: Endocrinology

## 2011-07-16 VITALS — BP 124/82 | HR 66 | Temp 98.0°F | Ht 65.0 in | Wt 190.0 lb

## 2011-07-16 DIAGNOSIS — E0590 Thyrotoxicosis, unspecified without thyrotoxic crisis or storm: Secondary | ICD-10-CM | POA: Diagnosis not present

## 2011-07-16 NOTE — Progress Notes (Signed)
Subjective:    Patient ID: Monica Lee, female    DOB: 1950/04/17, 61 y.o.   MRN: [id number]  HPI Pt is 3 1/2 months s/p i-131 rx for hyperthyroidism, due to multinodular goiter.  pt states she feels well in general.  Past Medical History  Diagnosis Date  . CHF (congestive heart failure)   . Hypertension   . Diabetes mellitus   . Asthma   . Acid reflux   . Kidney [place]   . S/P cataract surgery   . Hx of cholecystectomy   . S/P kidney transplant   . MI (myocardial infarction)   . CAD (coronary artery disease)     native vessel  . Nephrolithiasis   . GERD (gastroesophageal reflux disease)   . Diastolic dysfunction   . Hyperlipidemia   . Anemia   . DJD (degenerative joint disease)   . TIA (transient ischemic attack)   . Hx of colonic polyps   . Hx of colonoscopy   . PUD (peptic ulcer disease)   . Blind left eye   . Diabetic retinopathy   . Stroke   . Dialysis patient   . Dialysis patient     hx of dialysis    Past Surgical History  Procedure Date  . Transplantation renal   . Cardiac catheterization   . Abdominal hysterectomy   . Cholecystectomy   . Cataract extraction bil  . Intestinal perforation surgery 01/2009  . Breast biopsy 2010  . Gallstones removed     History   Social History  . Marital Status: Married    Spouse Name: N/A    Number of Children: 4  . Years of Education: N/A   Occupational History  . Disables/SSI since 2002    Social History Main Topics  . Smoking status: Never Smoker   . Smokeless tobacco: Never Used  . Alcohol Use: No  . Drug Use: No  . Sexually Active: Not on file   Other Topics Concern  . Not on file   Social History Narrative  . No narrative on file    Current Outpatient Prescriptions on File Prior to Visit  Medication Sig Dispense Refill  . amLODipine (NORVASC) 10 MG tablet TAKE ONE TABLET BY MOUTH EVERY DAY  90 tablet  2  . aspirin EC 325 MG tablet Take 325 mg by mouth daily.        Marland Kitchen azaTHIOprine (IMURAN) 50  MG tablet Take 50 mg by mouth 2 (two) times daily.        . brimonidine (ALPHAGAN) 0.2 % ophthalmic solution Place 1 drop into both eyes 2 (two) times daily.        . brimonidine-timolol (COMBIGAN) 0.2-0.5 % ophthalmic solution Place 1 drop into both eyes 2 (two) times daily.        . butalbital-acetaminophen-caffeine (FIORICET, ESGIC) 50-325-40 MG per tablet Take 1 tablet by mouth every 6 (six) hours as needed.        . calcitRIOL (ROCALTROL) 0.25 MCG capsule Take 0.25 mcg by mouth daily. Take 2 tabs daily      . cloNIDine (CATAPRES) 0.2 MG tablet Take 0.2 mg by mouth 2 (two) times daily.       . furosemide (LASIX) 80 MG tablet Take 2 tablets bid      . hydrALAZINE (APRESOLINE) 50 MG tablet Take 1 tablet (50 mg total) by mouth 3 (three) times daily.  90 tablet  3  . hydrocortisone (ANUSOL-HC) 2.5 % rectal cream Insert rectally at HS  12  g  1  . insulin aspart (NOVOLOG FLEXPEN) 100 UNIT/ML injection Use as directed three times daily with meals a - 16 - 20 - 20  50 mL  12  . insulin glargine (LANTUS) 100 UNIT/ML injection Inject 65 Units into the skin daily.  30 mL  12  . isosorbide mononitrate (IMDUR) 60 MG 24 hr tablet TAKE ONE TABLET BY MOUTH EVERY DAY  90 tablet  3  . KLOR-CON M20 20 MEQ tablet TAKE ONE TABLET BY MOUTH EVERY DAY,TAKE AN EXTRA TABLET FOR FLUID WEIGHT GAIN OF 3 POUNDS OR MORE DAILY AS NEEDED  60 each  3  . Magnesium Oxide 250 MG TABS Take 1 tablet by mouth daily.       . metoprolol (LOPRESSOR) 100 MG tablet TAKE ONE & ONE-HALF TABLETS BY MOUTH TWICE DAILY  90 tablet  8  . minoxidil (LONITEN) 2.5 MG tablet TAKE ONE TABLET BY MOUTH TWICE DAILY  60 tablet  1  . Multiple Vitamin (MULTIVITAMIN) capsule Take 1 capsule by mouth daily.        Marland Kitchen NEXIUM 40 MG capsule TAKE ONE CAPSULE BY MOUTH EVERY DAY  90 each  3  . polyethylene glycol powder (MIRALAX) powder Take 17 g by mouth daily.  850 g  11  . pravastatin (PRAVACHOL) 20 MG tablet Take 10 mg by mouth at bedtime.        . predniSONE  (DELTASONE) 10 MG tablet TAKE ONE TABLET BY MOUTH EVERY DAY  90 tablet  3  . tacrolimus (PROGRAF) 1 MG capsule Take by mouth. Take 8 mg in am and 7 mg in pm      . timolol (TIMOPTIC) 0.25 % ophthalmic solution Place 1 drop into the right eye 2 (two) times daily.       Marland Kitchen triamcinolone (KENALOG) 0.1 % cream Apply topically 2 (two) times daily.  30 g  0  . nitroGLYCERIN (NITROSTAT) 0.4 MG SL tablet Place 1 tablet (0.4 mg total) under the tongue every 5 (five) minutes as needed for chest pain.  25 tablet  12    Allergies  Allergen Reactions  . Cephalexin     REACTION: edema/face  . Propoxyphene-Acetaminophen     Family History  Problem Relation Age of Onset  . Kidney disease Neg Hx   . Arthritis Other     parent  . Heart disease Other     parent  . Hypertension Other     parent  . Diabetes Other     parent  . Diabetes Other     grandparent  . Thyroid disease Other     several  . Heart disease Father     BP 124/82  Pulse 66  Temp 98 F (36.7 C) (Oral)  Ht 5\' 5"  (1.651 m)  Wt 190 lb (86.183 kg)  BMI 31.62 kg/m2  SpO2 95%   Review of Systems Denies weight change    Objective:   Physical Exam VITAL SIGNS:  See vs page GENERAL: no distress Neck: there is a goiter 10x normal size, left > right, with multinodular surface.      Assessment & Plan:

## 2011-07-16 NOTE — Patient Instructions (Addendum)
blood tests are being requested for you today.  You will receive a letter with results. Please come back for a follow-up appointment in 6 weeks  

## 2011-07-24 DIAGNOSIS — Z940 Kidney transplant status: Secondary | ICD-10-CM | POA: Diagnosis not present

## 2011-07-29 ENCOUNTER — Other Ambulatory Visit: Payer: Self-pay | Admitting: Internal Medicine

## 2011-07-29 DIAGNOSIS — E1059 Type 1 diabetes mellitus with other circulatory complications: Secondary | ICD-10-CM | POA: Diagnosis not present

## 2011-07-29 DIAGNOSIS — L84 Corns and callosities: Secondary | ICD-10-CM | POA: Diagnosis not present

## 2011-07-29 DIAGNOSIS — L608 Other nail disorders: Secondary | ICD-10-CM | POA: Diagnosis not present

## 2011-07-29 DIAGNOSIS — I739 Peripheral vascular disease, unspecified: Secondary | ICD-10-CM | POA: Diagnosis not present

## 2011-07-29 DIAGNOSIS — M25579 Pain in unspecified ankle and joints of unspecified foot: Secondary | ICD-10-CM | POA: Diagnosis not present

## 2011-08-02 ENCOUNTER — Other Ambulatory Visit: Payer: Self-pay | Admitting: Internal Medicine

## 2011-08-07 DIAGNOSIS — Z940 Kidney transplant status: Secondary | ICD-10-CM | POA: Diagnosis not present

## 2011-08-07 DIAGNOSIS — E213 Hyperparathyroidism, unspecified: Secondary | ICD-10-CM | POA: Diagnosis not present

## 2011-08-07 DIAGNOSIS — Z79899 Other long term (current) drug therapy: Secondary | ICD-10-CM | POA: Diagnosis not present

## 2011-08-29 DIAGNOSIS — Z940 Kidney transplant status: Secondary | ICD-10-CM | POA: Diagnosis not present

## 2011-08-31 ENCOUNTER — Other Ambulatory Visit: Payer: Self-pay | Admitting: Internal Medicine

## 2011-09-03 ENCOUNTER — Encounter: Payer: Self-pay | Admitting: Endocrinology

## 2011-09-03 ENCOUNTER — Other Ambulatory Visit (INDEPENDENT_AMBULATORY_CARE_PROVIDER_SITE_OTHER): Payer: Medicare Other

## 2011-09-03 ENCOUNTER — Ambulatory Visit (INDEPENDENT_AMBULATORY_CARE_PROVIDER_SITE_OTHER): Payer: Medicare Other | Admitting: Endocrinology

## 2011-09-03 VITALS — BP 128/78 | HR 67 | Temp 97.9°F | Ht 65.0 in | Wt 190.0 lb

## 2011-09-03 DIAGNOSIS — E0590 Thyrotoxicosis, unspecified without thyrotoxic crisis or storm: Secondary | ICD-10-CM

## 2011-09-03 LAB — TSH: TSH: 1.43 u[IU]/mL (ref 0.35–5.50)

## 2011-09-03 NOTE — Progress Notes (Signed)
Subjective:    Patient ID: Monica Lee, female    DOB: [birth year], 61 y.o.   MRN: [id number]  HPI Pt is 5 1/2 months s/p i-131 rx for hyperthyroidism, due to multinodular goiter.  pt states she feels well in general.  Past Medical History  Diagnosis Date  . CHF (congestive heart failure)   . Hypertension   . Diabetes mellitus   . Asthma   . Acid reflux   . Kidney [place]   . S/P cataract surgery   . Hx of cholecystectomy   . S/P kidney transplant   . MI (myocardial infarction)   . CAD (coronary artery disease)     native vessel  . Nephrolithiasis   . GERD (gastroesophageal reflux disease)   . Diastolic dysfunction   . Hyperlipidemia   . Anemia   . DJD (degenerative joint disease)   . TIA (transient ischemic attack)   . Hx of colonic polyps   . Hx of colonoscopy   . PUD (peptic ulcer disease)   . Blind left eye   . Diabetic retinopathy   . Stroke   . Dialysis patient   . Dialysis patient     hx of dialysis    Past Surgical History  Procedure Date  . Transplantation renal   . Cardiac catheterization   . Abdominal hysterectomy   . Cholecystectomy   . Cataract extraction bil  . Intestinal perforation surgery 01/2009  . Breast biopsy 2010  . Gallstones removed     History   Social History  . Marital Status: Married    Spouse Name: N/A    Number of Children: 4  . Years of Education: N/A   Occupational History  . Disables/SSI since 2002    Social History Main Topics  . Smoking status: Never Smoker   . Smokeless tobacco: Never Used  . Alcohol Use: No  . Drug Use: No  . Sexually Active: Not on file   Other Topics Concern  . Not on file   Social History Narrative  . No narrative on file    Current Outpatient Prescriptions on File Prior to Visit  Medication Sig Dispense Refill  . amLODipine (NORVASC) 10 MG tablet TAKE ONE TABLET BY MOUTH EVERY DAY  90 tablet  2  . aspirin EC 325 MG tablet Take 325 mg by mouth daily.        Marland Kitchen azaTHIOprine (IMURAN) 50  MG tablet Take 50 mg by mouth 2 (two) times daily.        . brimonidine (ALPHAGAN) 0.2 % ophthalmic solution Place 1 drop into both eyes 2 (two) times daily.        . brimonidine-timolol (COMBIGAN) 0.2-0.5 % ophthalmic solution Place 1 drop into both eyes 2 (two) times daily.        . butalbital-acetaminophen-caffeine (FIORICET, ESGIC) 50-325-40 MG per tablet Take 1 tablet by mouth every 6 (six) hours as needed.        . calcitRIOL (ROCALTROL) 0.25 MCG capsule Take 0.25 mcg by mouth daily. Take 2 tabs daily      . cloNIDine (CATAPRES) 0.2 MG tablet Take 0.2 mg by mouth 2 (two) times daily.       . furosemide (LASIX) 80 MG tablet Take 2 tablets bid      . hydrALAZINE (APRESOLINE) 50 MG tablet Take 1 tablet (50 mg total) by mouth 3 (three) times daily.  90 tablet  3  . hydrocortisone (ANUSOL-HC) 2.5 % rectal cream Insert rectally at HS  12  g  1  . insulin aspart (NOVOLOG FLEXPEN) 100 UNIT/ML injection Use as directed three times daily with meals a - 16 - 20 - 20  50 mL  12  . insulin glargine (LANTUS) 100 UNIT/ML injection Inject 65 Units into the skin daily.  30 mL  12  . isosorbide mononitrate (IMDUR) 60 MG 24 hr tablet TAKE ONE TABLET BY MOUTH EVERY DAY  90 tablet  3  . KLOR-CON M20 20 MEQ tablet TAKE ONE TABLET BY MOUTH EVERY DAY,TAKE AN EXTRA TABLET FOR FLUID WEIGHT GAIN OF 3 POUNDS OR MORE DAILY AS NEEDED  60 each  3  . Magnesium Oxide 250 MG TABS Take 1 tablet by mouth daily.       . metoprolol (LOPRESSOR) 100 MG tablet TAKE ONE & ONE-HALF TABLETS BY MOUTH TWICE DAILY  90 tablet  8  . minoxidil (LONITEN) 2.5 MG tablet TAKE ONE TABLET BY MOUTH TWICE DAILY  60 tablet  1  . Multiple Vitamin (MULTIVITAMIN) capsule Take 1 capsule by mouth daily.        Marland Kitchen NEXIUM 40 MG capsule TAKE ONE CAPSULE BY MOUTH EVERY DAY  90 each  1  . polyethylene glycol powder (MIRALAX) powder Take 17 g by mouth daily.  850 g  11  . pravastatin (PRAVACHOL) 20 MG tablet Take 10 mg by mouth at bedtime.        . predniSONE  (DELTASONE) 10 MG tablet TAKE ONE TABLET BY MOUTH EVERY DAY  90 tablet  3  . tacrolimus (PROGRAF) 1 MG capsule Take by mouth. Take 8 mg in am and 7 mg in pm      . timolol (TIMOPTIC) 0.25 % ophthalmic solution Place 1 drop into the right eye 2 (two) times daily.       Marland Kitchen triamcinolone (KENALOG) 0.1 % cream Apply topically 2 (two) times daily.  30 g  0  . nitroGLYCERIN (NITROSTAT) 0.4 MG SL tablet Place 1 tablet (0.4 mg total) under the tongue every 5 (five) minutes as needed for chest pain.  25 tablet  12    Allergies  Allergen Reactions  . Cephalexin     REACTION: edema/face  . Propoxyphene-Acetaminophen     Family History  Problem Relation Age of Onset  . Kidney disease Neg Hx   . Arthritis Other     parent  . Heart disease Other     parent  . Hypertension Other     parent  . Diabetes Other     parent  . Diabetes Other     grandparent  . Thyroid disease Other     several  . Heart disease Father     BP 128/78  Pulse 67  Temp 97.9 F (36.6 C) (Oral)  Ht 5\' 5"  (1.651 m)  Wt 190 lb (86.183 kg)  BMI 31.62 kg/m2  SpO2 94%  Review of Systems She denies weight change    Objective:   Physical Exam VITAL SIGNS:  See vs page GENERAL: no distress Neck: there is a goiter 10x normal size, left > right, with multinodular surface.     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

## 2011-09-03 NOTE — Patient Instructions (Addendum)
A thyroid blood test is being requested for you today.  You will receive a letter with results.   Please come back for a follow-up appointment in 3 months.   If the thyroid level is still overactive, you should have the radioactive iodine treatment again.

## 2011-09-04 ENCOUNTER — Telehealth: Payer: Self-pay | Admitting: *Deleted

## 2011-09-04 NOTE — Telephone Encounter (Signed)
Called pt to inform of lab results, pt informed (letter also mailed to pt). 

## 2011-09-09 DIAGNOSIS — E213 Hyperparathyroidism, unspecified: Secondary | ICD-10-CM | POA: Diagnosis not present

## 2011-09-09 DIAGNOSIS — Z79899 Other long term (current) drug therapy: Secondary | ICD-10-CM | POA: Diagnosis not present

## 2011-09-09 DIAGNOSIS — Z940 Kidney transplant status: Secondary | ICD-10-CM | POA: Diagnosis not present

## 2011-09-17 ENCOUNTER — Other Ambulatory Visit: Payer: Self-pay | Admitting: Internal Medicine

## 2011-09-30 ENCOUNTER — Encounter: Payer: Self-pay | Admitting: Cardiology

## 2011-09-30 ENCOUNTER — Ambulatory Visit (INDEPENDENT_AMBULATORY_CARE_PROVIDER_SITE_OTHER): Payer: Medicare Other | Admitting: Cardiology

## 2011-09-30 VITALS — BP 124/71 | HR 64 | Ht 65.0 in | Wt 192.4 lb

## 2011-09-30 DIAGNOSIS — I252 Old myocardial infarction: Secondary | ICD-10-CM

## 2011-09-30 DIAGNOSIS — I5032 Chronic diastolic (congestive) heart failure: Secondary | ICD-10-CM

## 2011-09-30 DIAGNOSIS — E785 Hyperlipidemia, unspecified: Secondary | ICD-10-CM | POA: Diagnosis not present

## 2011-09-30 DIAGNOSIS — I2510 Atherosclerotic heart disease of native coronary artery without angina pectoris: Secondary | ICD-10-CM

## 2011-09-30 DIAGNOSIS — Z8679 Personal history of other diseases of the circulatory system: Secondary | ICD-10-CM

## 2011-09-30 DIAGNOSIS — I10 Essential (primary) hypertension: Secondary | ICD-10-CM

## 2011-09-30 MED ORDER — NITROGLYCERIN 0.4 MG SL SUBL: 0.4000 mg | SUBLINGUAL_TABLET | SUBLINGUAL | Status: DC | PRN

## 2011-09-30 NOTE — Assessment & Plan Note (Signed)
Stable. Continue secondary preventative therapy. Return the office in one year. 

## 2011-09-30 NOTE — Progress Notes (Signed)
HPI Monica Lee returns today for a history of coronary artery disease, chronic diastolic heart failure, history of severe left ventricular hypertrophy on echocardiography in 2011, and very difficult to control hypertension. She is followed by multiple providers looking at her chart.  Her blood pressures under excellent control today. She is having no chest discomfort consistent with angina. She needs to have her nitroglycerin renewed. She denies orthopnea, PND or edema.  He seems to be compliant with her meds.  Past Medical History  Diagnosis Date  . CHF (congestive heart failure)   . Hypertension   . Diabetes mellitus   . Asthma   . Acid reflux   . Kidney [place]   . S/P cataract surgery   . Hx of cholecystectomy   . S/P kidney transplant   . MI (myocardial infarction)   . CAD (coronary artery disease)     native vessel  . Nephrolithiasis   . GERD (gastroesophageal reflux disease)   . Diastolic dysfunction   . Hyperlipidemia   . Anemia   . DJD (degenerative joint disease)   . TIA (transient ischemic attack)   . Hx of colonic polyps   . Hx of colonoscopy   . PUD (peptic ulcer disease)   . Blind left eye   . Diabetic retinopathy   . Stroke   . Dialysis patient   . Dialysis patient     hx of dialysis    Current Outpatient Prescriptions  Medication Sig Dispense Refill  . amLODipine (NORVASC) 10 MG tablet TAKE ONE TABLET BY MOUTH EVERY DAY  90 tablet  0  . aspirin EC 325 MG tablet Take 325 mg by mouth daily.        Marland Kitchen azaTHIOprine (IMURAN) 50 MG tablet Take 50 mg by mouth 2 (two) times daily.        . brimonidine (ALPHAGAN) 0.2 % ophthalmic solution Place 1 drop into both eyes 2 (two) times daily.        . brimonidine-timolol (COMBIGAN) 0.2-0.5 % ophthalmic solution Place 1 drop into both eyes 2 (two) times daily.        . butalbital-acetaminophen-caffeine (FIORICET, ESGIC) 50-325-40 MG per tablet Take 1 tablet by mouth every 6 (six) hours as needed.        . calcitRIOL  (ROCALTROL) 0.25 MCG capsule Take 0.25 mcg by mouth daily. Take 2 tabs daily      . cloNIDine (CATAPRES) 0.2 MG tablet Take 0.2 mg by mouth 2 (two) times daily.       . furosemide (LASIX) 80 MG tablet Take 2 tablets bid      . hydrALAZINE (APRESOLINE) 50 MG tablet Take 1 tablet (50 mg total) by mouth 3 (three) times daily.  90 tablet  3  . hydrocortisone (ANUSOL-HC) 2.5 % rectal cream Insert rectally at HS  12 g  1  . insulin aspart (NOVOLOG FLEXPEN) 100 UNIT/ML injection Use as directed three times daily with meals a - 16 - 20 - 20  50 mL  12  . insulin glargine (LANTUS) 100 UNIT/ML injection Inject 65 Units into the skin daily.  30 mL  12  . isosorbide mononitrate (IMDUR) 60 MG 24 hr tablet TAKE ONE TABLET BY MOUTH EVERY DAY  90 tablet  3  . KLOR-CON M20 20 MEQ tablet TAKE ONE TABLET BY MOUTH EVERY DAY,TAKE AN EXTRA TABLET FOR FLUID WEIGHT GAIN OF 3 POUNDS OR MORE DAILY AS NEEDED  60 each  3  . Magnesium Oxide 250 MG TABS Take  1 tablet by mouth daily.       . metoprolol (LOPRESSOR) 100 MG tablet TAKE ONE & ONE-HALF TABLETS BY MOUTH TWICE DAILY  90 tablet  8  . minoxidil (LONITEN) 2.5 MG tablet TAKE ONE TABLET BY MOUTH TWICE DAILY  60 tablet  1  . Multiple Vitamin (MULTIVITAMIN) capsule Take 1 capsule by mouth daily.        Marland Kitchen NEXIUM 40 MG capsule TAKE ONE CAPSULE BY MOUTH EVERY DAY  90 each  1  . nitroGLYCERIN (NITROSTAT) 0.4 MG SL tablet Place 1 tablet (0.4 mg total) under the tongue every 5 (five) minutes as needed for chest pain.  25 tablet  12  . polyethylene glycol powder (MIRALAX) powder Take 17 g by mouth daily.  850 g  11  . pravastatin (PRAVACHOL) 20 MG tablet Take 10 mg by mouth at bedtime.        . predniSONE (DELTASONE) 10 MG tablet TAKE ONE TABLET BY MOUTH EVERY DAY  90 tablet  3  . tacrolimus (PROGRAF) 1 MG capsule Take by mouth. Take 8 mg in am and 7 mg in pm      . timolol (TIMOPTIC) 0.25 % ophthalmic solution Place 1 drop into the right eye 2 (two) times daily.          Allergies  Allergen Reactions  . Cephalexin     REACTION: edema/face  . Propoxyphene-Acetaminophen     Family History  Problem Relation Age of Onset  . Kidney disease Neg Hx   . Arthritis Other     parent  . Heart disease Other     parent  . Hypertension Other     parent  . Diabetes Other     parent  . Diabetes Other     grandparent  . Thyroid disease Other     several  . Heart disease Father     History   Social History  . Marital Status: Married    Spouse Name: N/A    Number of Children: 4  . Years of Education: N/A   Occupational History  . Disables/SSI since 2002    Social History Main Topics  . Smoking status: Never Smoker   . Smokeless tobacco: Never Used  . Alcohol Use: No  . Drug Use: No  . Sexually Active: Not on file   Other Topics Concern  . Not on file   Social History Narrative  . No narrative on file    ROS ALL NEGATIVE EXCEPT THOSE NOTED IN HPI  PE  General Appearance: well developed, well nourished in no acute distress way HEENT: symmetrical face, PERRLA, good dentition  Neck: no JVD, thyromegaly, or adenopathy, trachea midline Chest: symmetric without deformity Cardiac: PMI non-displaced, RRR, normal S1, S2, no gallop or murmur Lung: clear to ausculation and percussion Vascular: all pulses full without bruits  Abdominal: nondistended, nontender, good bowel sounds, no HSM, no bruits Extremities: no cyanosis, clubbing or edema, no sign of DVT, no varicosities  Skin: normal color, no rashes Neuro: alert and oriented x 3, non-focal Pysch: normal affect  EKG Normal sinus rhythm, inferior lateral ST-T wave changes which are unchanged from last EKG. BMET    Component Value Date/Time   NA 142 01/01/2011 1448   K 3.9 01/01/2011 1448   CL 105 01/01/2011 1448   CO2 29 01/01/2011 1448   GLUCOSE 128* 01/01/2011 1448   BUN 22 01/01/2011 1448   CREATININE 1.2 01/01/2011 1448   CALCIUM 9.2 01/01/2011 1448  GFRNONAA >60 09/06/2010  0525   GFRAA >60 09/06/2010 0525    Lipid Panel     Component Value Date/Time   CHOL 113 01/01/2011 1448   TRIG 216.0* 01/01/2011 1448   HDL 37.90* 01/01/2011 1448   CHOLHDL 3 01/01/2011 1448   VLDL 43.2* 01/01/2011 1448   LDLCALC 68 09/04/2010 0500    CBC    Component Value Date/Time   WBC 5.1 09/05/2010 0458   RBC 4.01 09/05/2010 0458   HGB 11.1* 09/05/2010 0458   HCT 34.0* 09/05/2010 0458   PLT 213 09/05/2010 0458   MCV 84.8 09/05/2010 0458   MCH 27.7 09/05/2010 0458   MCHC 32.6 09/05/2010 0458   RDW 15.2 09/05/2010 0458   LYMPHSABS 0.9 09/03/2010 1732   MONOABS 0.5 09/03/2010 1732   EOSABS 0.0 09/03/2010 1732   BASOSABS 0.0 09/03/2010 1732

## 2011-09-30 NOTE — Patient Instructions (Addendum)
Your physician wants you to follow-up in: 1 year  You will receive a reminder letter in the mail two months in advance. If you don't receive a letter, please call our office to schedule the follow-up appointment.  Your physician recommends that you continue on your current medications as directed. Please refer to the Current Medication list given to you today.  

## 2011-10-07 ENCOUNTER — Other Ambulatory Visit: Payer: Self-pay

## 2011-10-07 MED ORDER — INSULIN GLARGINE 100 UNIT/ML ~~LOC~~ SOLN: 65.0000 [IU] | Freq: Every day | SUBCUTANEOUS | Status: DC

## 2011-10-08 ENCOUNTER — Other Ambulatory Visit: Payer: Self-pay | Admitting: Internal Medicine

## 2011-10-14 DIAGNOSIS — E213 Hyperparathyroidism, unspecified: Secondary | ICD-10-CM | POA: Diagnosis not present

## 2011-10-14 DIAGNOSIS — E785 Hyperlipidemia, unspecified: Secondary | ICD-10-CM | POA: Diagnosis not present

## 2011-10-14 DIAGNOSIS — Z940 Kidney transplant status: Secondary | ICD-10-CM | POA: Diagnosis not present

## 2011-10-14 DIAGNOSIS — I10 Essential (primary) hypertension: Secondary | ICD-10-CM | POA: Diagnosis not present

## 2011-10-14 DIAGNOSIS — N2581 Secondary hyperparathyroidism of renal origin: Secondary | ICD-10-CM | POA: Diagnosis not present

## 2011-10-14 DIAGNOSIS — Z23 Encounter for immunization: Secondary | ICD-10-CM | POA: Diagnosis not present

## 2011-10-14 DIAGNOSIS — E119 Type 2 diabetes mellitus without complications: Secondary | ICD-10-CM | POA: Diagnosis not present

## 2011-10-17 DIAGNOSIS — L608 Other nail disorders: Secondary | ICD-10-CM | POA: Diagnosis not present

## 2011-10-17 DIAGNOSIS — E1059 Type 1 diabetes mellitus with other circulatory complications: Secondary | ICD-10-CM | POA: Diagnosis not present

## 2011-10-17 DIAGNOSIS — I739 Peripheral vascular disease, unspecified: Secondary | ICD-10-CM | POA: Diagnosis not present

## 2011-10-17 DIAGNOSIS — L84 Corns and callosities: Secondary | ICD-10-CM | POA: Diagnosis not present

## 2011-10-29 ENCOUNTER — Other Ambulatory Visit: Payer: Self-pay | Admitting: Cardiology

## 2011-10-29 ENCOUNTER — Other Ambulatory Visit: Payer: Self-pay | Admitting: Internal Medicine

## 2011-10-31 ENCOUNTER — Other Ambulatory Visit: Payer: Self-pay | Admitting: Internal Medicine

## 2011-10-31 ENCOUNTER — Other Ambulatory Visit: Payer: Self-pay | Admitting: Cardiology

## 2011-10-31 DIAGNOSIS — Z79899 Other long term (current) drug therapy: Secondary | ICD-10-CM | POA: Diagnosis not present

## 2011-10-31 DIAGNOSIS — Z940 Kidney transplant status: Secondary | ICD-10-CM | POA: Diagnosis not present

## 2011-10-31 DIAGNOSIS — E213 Hyperparathyroidism, unspecified: Secondary | ICD-10-CM | POA: Diagnosis not present

## 2011-11-13 DIAGNOSIS — Z940 Kidney transplant status: Secondary | ICD-10-CM | POA: Diagnosis not present

## 2011-11-21 ENCOUNTER — Other Ambulatory Visit: Payer: Self-pay | Admitting: Internal Medicine

## 2011-11-25 DIAGNOSIS — Z940 Kidney transplant status: Secondary | ICD-10-CM | POA: Diagnosis not present

## 2011-12-05 ENCOUNTER — Encounter: Payer: Self-pay | Admitting: Internal Medicine

## 2011-12-05 ENCOUNTER — Ambulatory Visit (INDEPENDENT_AMBULATORY_CARE_PROVIDER_SITE_OTHER): Payer: Medicare Other | Admitting: Internal Medicine

## 2011-12-05 ENCOUNTER — Other Ambulatory Visit (INDEPENDENT_AMBULATORY_CARE_PROVIDER_SITE_OTHER): Payer: Medicare Other

## 2011-12-05 VITALS — BP 162/82 | HR 79 | Temp 98.8°F | Ht 65.0 in | Wt 194.4 lb

## 2011-12-05 DIAGNOSIS — I10 Essential (primary) hypertension: Secondary | ICD-10-CM

## 2011-12-05 DIAGNOSIS — K649 Unspecified hemorrhoids: Secondary | ICD-10-CM

## 2011-12-05 DIAGNOSIS — E119 Type 2 diabetes mellitus without complications: Secondary | ICD-10-CM

## 2011-12-05 DIAGNOSIS — E785 Hyperlipidemia, unspecified: Secondary | ICD-10-CM

## 2011-12-05 LAB — URINALYSIS, ROUTINE W REFLEX MICROSCOPIC
Bilirubin Urine: NEGATIVE
Hgb urine dipstick: NEGATIVE
Ketones, ur: NEGATIVE
Total Protein, Urine: NEGATIVE
Urine Glucose: NEGATIVE
Urobilinogen, UA: 0.2 (ref 0.0–1.0)

## 2011-12-05 LAB — BASIC METABOLIC PANEL
CO2: 30 mEq/L (ref 19–32)
Calcium: 8.7 mg/dL (ref 8.4–10.5)
Chloride: 104 mEq/L (ref 96–112)
Glucose, Bld: 135 mg/dL — ABNORMAL HIGH (ref 70–99)
Sodium: 143 mEq/L (ref 135–145)

## 2011-12-05 LAB — HEPATIC FUNCTION PANEL
ALT: 15 U/L (ref 0–35)
AST: 22 U/L (ref 0–37)
Albumin: 4.2 g/dL (ref 3.5–5.2)
Alkaline Phosphatase: 48 U/L (ref 39–117)
Bilirubin, Direct: 0 mg/dL (ref 0.0–0.3)
Total Protein: 7.5 g/dL (ref 6.0–8.3)

## 2011-12-05 LAB — LIPID PANEL
Total CHOL/HDL Ratio: 4
Triglycerides: 344 mg/dL — ABNORMAL HIGH (ref 0.0–149.0)

## 2011-12-05 MED ORDER — TRAMADOL HCL 50 MG PO TABS: 50.0000 mg | ORAL_TABLET | Freq: Three times a day (TID) | ORAL | Status: DC | PRN

## 2011-12-05 NOTE — Assessment & Plan Note (Signed)
stable overall by hx and exam, most recent data reviewed with pt, and pt to continue medical treatment as before Lab Results  Component Value Date   HGBA1C 8.5* 01/01/2011

## 2011-12-05 NOTE — Progress Notes (Signed)
Subjective:    Patient ID: Monica Lee, female    DOB: 1950/09/24, 61 y.o.   MRN: [id number]  HPI  Here for f/u;  Overall doing ok;  Pt denies CP, worsening SOB, DOE, wheezing, orthopnea, PND, worsening LE edema, palpitations, dizziness or syncope.  Pt denies neurological change such as new Headache, facial or extremity weakness.  Pt denies polydipsia, polyuria, or low sugar symptoms. Pt states overall good compliance with treatment and medications, good tolerability, and trying to follow lower cholesterol diet.  Pt denies worsening depressive symptoms, suicidal ideation or panic. No fever, wt loss, night sweats, loss of appetite, or other constitutional symptoms.  Pt states good ability with ADL's, low fall risk, home safety reviewed and adequate, no significant changes in hearing or vision, and occasionally active with exercise.  Needs DM shoes, has hx of ulcerations to feet.  Has recent hx of recent increased K supp to 4 per day, with plan for f/u K per renal nov 26.  No other acute complaints.  MD roster includes cardiology, renal, optho, podiatry, dental, endo (thyroid ony), and GI.  Pt also asks for gen surgury to address increased symptomatic hemorrhoid recently with discomfort, no bleeding Past Medical History  Diagnosis Date  . CHF (congestive heart failure)   . Hypertension   . Diabetes mellitus   . Asthma   . Acid reflux   . Kidney [place]   . S/P cataract surgery   . Hx of cholecystectomy   . S/P kidney transplant   . MI (myocardial infarction)   . CAD (coronary artery disease)     native vessel  . Nephrolithiasis   . GERD (gastroesophageal reflux disease)   . Diastolic dysfunction   . Hyperlipidemia   . Anemia   . DJD (degenerative joint disease)   . TIA (transient ischemic attack)   . Hx of colonic polyps   . Hx of colonoscopy   . PUD (peptic ulcer disease)   . Blind left eye   . Diabetic retinopathy(362.0)   . Stroke   . Dialysis patient   . Dialysis patient     hx of  dialysis   Past Surgical History  Procedure Date  . Transplantation renal   . Cardiac catheterization   . Abdominal hysterectomy   . Cholecystectomy   . Cataract extraction bil  . Intestinal perforation surgery 01/2009  . Breast biopsy 2010  . Gallstones removed     reports that she has never smoked. She has never used smokeless tobacco. She reports that she does not drink alcohol or use illicit drugs. family history includes Arthritis in her other; Diabetes in her others; Heart disease in her father and other; Hypertension in her other; and Thyroid disease in her other.  There is no history of Kidney disease. Allergies  Allergen Reactions  . Cephalexin     REACTION: edema/face  . Propoxyphene-Acetaminophen    Current Outpatient Prescriptions on File Prior to Visit  Medication Sig Dispense Refill  . amLODipine (NORVASC) 10 MG tablet TAKE ONE TABLET BY MOUTH EVERY DAY  90 tablet  0  . aspirin EC 325 MG tablet Take 325 mg by mouth daily.        Marland Kitchen azaTHIOprine (IMURAN) 50 MG tablet Take 50 mg by mouth 2 (two) times daily.        . brimonidine (ALPHAGAN) 0.2 % ophthalmic solution Place 1 drop into both eyes 2 (two) times daily.        . brimonidine-timolol (COMBIGAN) 0.2-0.5 %  ophthalmic solution Place 1 drop into both eyes 2 (two) times daily.        . butalbital-acetaminophen-caffeine (FIORICET, ESGIC) 50-325-40 MG per tablet Take 1 tablet by mouth every 6 (six) hours as needed.        . calcitRIOL (ROCALTROL) 0.25 MCG capsule Take 0.25 mcg by mouth daily. Take 2 tabs daily      . cloNIDine (CATAPRES) 0.2 MG tablet Take 0.2 mg by mouth 2 (two) times daily.       . furosemide (LASIX) 80 MG tablet Take 2 tablets bid      . hydrocortisone (ANUSOL-HC) 2.5 % rectal cream Insert rectally at HS  12 g  1  . insulin glargine (LANTUS) 100 UNIT/ML injection Inject 65 Units into the skin daily.  30 mL  12  . isosorbide mononitrate (IMDUR) 60 MG 24 hr tablet TAKE ONE TABLET BY MOUTH EVERY DAY  90  tablet  3  . LANTUS 100 UNIT/ML injection INJECT 55 UNITS INTO THE SKIN DAILY  30 mL  11  . Magnesium Oxide 250 MG TABS Take 1 tablet by mouth daily.       . metoprolol (LOPRESSOR) 100 MG tablet TAKE ONE & ONE-HALF TABLETS BY MOUTH TWICE DAILY  90 tablet  7  . minoxidil (LONITEN) 2.5 MG tablet TAKE ONE TABLET BY MOUTH TWICE DAILY  60 tablet  2  . Multiple Vitamin (MULTIVITAMIN) capsule Take 1 capsule by mouth daily.        Marland Kitchen NEXIUM 40 MG capsule TAKE ONE CAPSULE BY MOUTH EVERY DAY  90 each  1  . nitroGLYCERIN (NITROSTAT) 0.4 MG SL tablet Place 1 tablet (0.4 mg total) under the tongue every 5 (five) minutes as needed for chest pain.  25 tablet  11  . NOVOLOG FLEXPEN 100 UNIT/ML injection USE AS DIRECTED THREE TIMES DAILY WITH MEALS A-16-20-20  60 mL  2  . polyethylene glycol powder (GLYCOLAX/MIRALAX) powder TAKE 17 GM BY MOUTH DAILY  1054 g  10  . pravastatin (PRAVACHOL) 20 MG tablet Take 10 mg by mouth at bedtime.        . predniSONE (DELTASONE) 10 MG tablet TAKE ONE TABLET BY MOUTH EVERY DAY  90 tablet  3  . tacrolimus (PROGRAF) 1 MG capsule Take by mouth. Take 8 mg in am and 7 mg in pm      . timolol (TIMOPTIC) 0.25 % ophthalmic solution Place 1 drop into the right eye 2 (two) times daily.       . hydrALAZINE (APRESOLINE) 50 MG tablet Take 1 tablet (50 mg total) by mouth 3 (three) times daily.  90 tablet  3  . KLOR-CON M20 20 MEQ tablet TAKE ONE TABLET BY MOUTH EVERY DAY,TAKE AN EXTRA TABLET FOR FLUID WEIGHT GAIN OF 3 POUNDS OR MORE DAILY AS NEEDED  60 tablet  3   Review of Systems  Constitutional: Negative for diaphoresis and unexpected weight change.  HENT: Negative for tinnitus.   Eyes: Negative for photophobia and visual disturbance.  Respiratory: Negative for choking and stridor.   Gastrointestinal: Negative for vomiting and blood in stool.  Genitourinary: Negative for hematuria and decreased urine volume.  Musculoskeletal: Negative for gait problem.  Skin: Negative for color change  and wound.  Neurological: Negative for tremors and numbness.  Psychiatric/Behavioral: Negative for decreased concentration. The patient is not hyperactive.       Objective:   Physical Exam BP 162/82  Pulse 79  Temp 98.8 F (37.1 C) (Oral)  Ht  5\' 5"  (1.651 m)  Wt 194 lb 6 oz (88.168 kg)  BMI 32.35 kg/m2  SpO2 93% Physical Exam  VS noted, not ill appearing Constitutional: Pt appears well-developed and well-nourished.  HENT: Head: Normocephalic.  Right Ear: External ear normal.  Left Ear: External ear normal.  Eyes: Conjunctivae and EOM are normal. Pupils are equal, round, and reactive to light.  Neck: Normal range of motion. Neck supple.  Cardiovascular: Normal rate and regular rhythm.  with gr 1-2 /6 sys murmur LUSB Pulmonary/Chest: Effort normal and breath sounds normal.  Abd:  Soft, NT, non-distended, + BS Neurological: Pt is alert. Not confused , motor/gait intact Skin: Skin is warm. No erythema.  No LE edema Psychiatric: Pt behavior is normal. Thought content normal. Mild nervous only     Assessment & Plan:

## 2011-12-05 NOTE — Assessment & Plan Note (Signed)
Has not taken meds this am, to Continue all other medications as before,  BP Readings from Last 3 Encounters:  12/05/11 162/82  09/30/11 124/71  09/03/11 128/78

## 2011-12-05 NOTE — Assessment & Plan Note (Signed)
stable overall by hx and exam, most recent data reviewed with pt, and pt to continue medical treatment as before Lab Results  Component Value Date   LDLCALC 68 09/04/2010

## 2011-12-05 NOTE — Patient Instructions (Addendum)
Please make appointment for your 3 mo followup with Dr Everardo All at the scheduling desk as you leave You will be contacted regarding the referral for: general surgury for the hemorrhoids You are given the prescription for the Diabetic shoes; our fax number here [phone] Continue all other medications as before, including the tramadol Please have the pharmacy call with any other refills you may need. Please keep your appointments with your specialists as you have planned Please go to LAB in the Basement for the blood and/or urine tests to be done today You will be contacted by phone if any changes need to be made immediately.  Otherwise, you will receive a letter about your results with an explanation We will fax results to Dr Deterding/renal later Please return in 6 months, or sooner if needed

## 2011-12-05 NOTE — Assessment & Plan Note (Signed)
To f/u concerns with gen surgury

## 2011-12-06 ENCOUNTER — Encounter: Payer: Self-pay | Admitting: Internal Medicine

## 2011-12-06 ENCOUNTER — Telehealth: Payer: Self-pay | Admitting: Internal Medicine

## 2011-12-06 NOTE — Telephone Encounter (Signed)
Message copied by Corwin Levins on Fri Dec 06, 2011 12:20 PM ------      Message from: Pincus Sanes      Created: Fri Dec 06, 2011 10:02 AM       Patient informed of results and referral.  She has agreed to see an endocrinologist

## 2011-12-06 NOTE — Telephone Encounter (Signed)
Ok for referral to endo

## 2011-12-10 DIAGNOSIS — Z940 Kidney transplant status: Secondary | ICD-10-CM | POA: Diagnosis not present

## 2011-12-10 DIAGNOSIS — Z79899 Other long term (current) drug therapy: Secondary | ICD-10-CM | POA: Diagnosis not present

## 2011-12-10 DIAGNOSIS — E213 Hyperparathyroidism, unspecified: Secondary | ICD-10-CM | POA: Diagnosis not present

## 2011-12-15 LAB — HM DIABETES EYE EXAM: HM Diabetic Eye Exam: NORMAL

## 2011-12-17 ENCOUNTER — Other Ambulatory Visit: Payer: Self-pay | Admitting: Internal Medicine

## 2011-12-17 ENCOUNTER — Encounter (INDEPENDENT_AMBULATORY_CARE_PROVIDER_SITE_OTHER): Payer: Self-pay | Admitting: General Surgery

## 2011-12-17 ENCOUNTER — Ambulatory Visit (INDEPENDENT_AMBULATORY_CARE_PROVIDER_SITE_OTHER): Payer: Medicare Other | Admitting: General Surgery

## 2011-12-17 VITALS — BP 120/70 | HR 66 | Temp 97.9°F | Resp 18 | Ht 65.0 in | Wt 195.0 lb

## 2011-12-17 DIAGNOSIS — K623 Rectal prolapse: Secondary | ICD-10-CM

## 2011-12-17 NOTE — Patient Instructions (Addendum)

## 2011-12-17 NOTE — Progress Notes (Signed)
Chief Complaint  Patient presents with  . Hemorrhoids    HISTORY: Monica Lee is a 61 y.o. female who presents to the office with prolapasing anal tissue.  Other symptoms include occassional bleeding.  She has tried hemorrhoid cream and suppositories in the past with little success.  Sitting for a long time makes the symptoms worse.  This had been occurring for years.  It is intermittent in nature.  Her bowel habits are 1-3 times a day and her bowel movements are soft. She takes miralax and colace daily. Her fiber intake is minimal.  Her last colonoscopy was 1/13.  She does have prolapsing tissue and stool leakage daily.      Past Medical History  Diagnosis Date  . CHF (congestive heart failure)   . Hypertension   . Diabetes mellitus   . Asthma   . Acid reflux   . Kidney [place]   . S/P cataract surgery   . Hx of cholecystectomy   . S/P kidney transplant   . MI (myocardial infarction)   . CAD (coronary artery disease)     native vessel  . Nephrolithiasis   . GERD (gastroesophageal reflux disease)   . Diastolic dysfunction   . Hyperlipidemia   . Anemia   . DJD (degenerative joint disease)   . TIA (transient ischemic attack)   . Hx of colonic polyps   . Hx of colonoscopy   . PUD (peptic ulcer disease)   . Blind left eye   . Diabetic retinopathy(362.0)   . Stroke   . Dialysis patient   . Dialysis patient     hx of dialysis  . Heart murmur   . Hemorrhoids   . Chills   . Sore throat   . Cough   . Diarrhea   . Abdominal pain   . Generalized headaches   . Fainting   . Weakness       Past Surgical History  Procedure Date  . Transplantation renal   . Cardiac catheterization   . Abdominal hysterectomy   . Cholecystectomy   . Cataract extraction bil  . Intestinal perforation surgery 01/2009  . Breast biopsy 2010  . Gallstones removed         Current Outpatient Prescriptions  Medication Sig Dispense Refill  . amLODipine (NORVASC) 10 MG tablet TAKE ONE TABLET BY  MOUTH EVERY DAY  90 tablet  0  . aspirin EC 325 MG tablet Take 325 mg by mouth daily.        Marland Kitchen azaTHIOprine (IMURAN) 50 MG tablet Take 50 mg by mouth 2 (two) times daily.        . brimonidine (ALPHAGAN) 0.2 % ophthalmic solution Place 1 drop into both eyes 2 (two) times daily.        . brimonidine-timolol (COMBIGAN) 0.2-0.5 % ophthalmic solution Place 1 drop into both eyes 2 (two) times daily.        . butalbital-acetaminophen-caffeine (FIORICET, ESGIC) 50-325-40 MG per tablet Take 1 tablet by mouth every 6 (six) hours as needed.        . calcitRIOL (ROCALTROL) 0.25 MCG capsule Take 0.25 mcg by mouth daily. Take 2 tabs daily      . cloNIDine (CATAPRES) 0.2 MG tablet Take 0.2 mg by mouth 2 (two) times daily.       . furosemide (LASIX) 80 MG tablet Take 2 tablets bid      . hydrALAZINE (APRESOLINE) 50 MG tablet Take 1 tablet (50 mg total) by mouth 3 (three)  times daily.  90 tablet  3  . hydrocortisone (ANUSOL-HC) 2.5 % rectal cream Insert rectally at HS  12 g  1  . insulin glargine (LANTUS) 100 UNIT/ML injection Inject 65 Units into the skin daily.  30 mL  12  . isosorbide mononitrate (IMDUR) 60 MG 24 hr tablet TAKE ONE TABLET BY MOUTH EVERY DAY  90 tablet  3  . K Phos Mono-Sod Phos Di & Mono (PHOSPHA 250 NEUTRAL PO) Take by mouth 2 (two) times daily.      Marland Kitchen KLOR-CON M20 20 MEQ tablet TAKE ONE TABLET BY MOUTH EVERY DAY,TAKE AN EXTRA TABLET FOR FLUID WEIGHT GAIN OF 3 POUNDS OR MORE DAILY AS NEEDED  60 tablet  3  . LANTUS 100 UNIT/ML injection INJECT 55 UNITS INTO THE SKIN DAILY  30 mL  11  . Magnesium Oxide 250 MG TABS Take 1 tablet by mouth daily.       . metoprolol (LOPRESSOR) 100 MG tablet TAKE ONE & ONE-HALF TABLETS BY MOUTH TWICE DAILY  90 tablet  7  . minoxidil (LONITEN) 2.5 MG tablet TAKE ONE TABLET BY MOUTH TWICE DAILY  60 tablet  2  . Multiple Vitamin (MULTIVITAMIN) capsule Take 1 capsule by mouth daily.        Marland Kitchen NEXIUM 40 MG capsule TAKE ONE CAPSULE BY MOUTH EVERY DAY  90 each  1  .  nitroGLYCERIN (NITROSTAT) 0.4 MG SL tablet Place 1 tablet (0.4 mg total) under the tongue every 5 (five) minutes as needed for chest pain.  25 tablet  11  . NOVOLOG FLEXPEN 100 UNIT/ML injection USE AS DIRECTED THREE TIMES DAILY WITH MEALS A-16-20-20  60 mL  2  . polyethylene glycol powder (GLYCOLAX/MIRALAX) powder TAKE 17 GM BY MOUTH DAILY  1054 g  10  . pravastatin (PRAVACHOL) 20 MG tablet Take 10 mg by mouth at bedtime.        . predniSONE (DELTASONE) 10 MG tablet TAKE ONE TABLET BY MOUTH EVERY DAY  90 tablet  3  . tacrolimus (PROGRAF) 1 MG capsule Take by mouth. Take 8 mg in am and 7 mg in pm      . timolol (TIMOPTIC) 0.25 % ophthalmic solution Place 1 drop into the right eye 2 (two) times daily.       . traMADol (ULTRAM) 50 MG tablet Take 1 tablet (50 mg total) by mouth every 8 (eight) hours as needed for pain.  60 tablet  2      Allergies  Allergen Reactions  . Cephalexin     REACTION: edema/face  . Propoxyphene-Acetaminophen       Family History  Problem Relation Age of Onset  . Kidney disease Neg Hx   . Arthritis Other     parent  . Heart disease Other     parent  . Hypertension Other     parent  . Diabetes Other     parent  . Diabetes Other     grandparent  . Thyroid disease Other     several  . Heart disease Father     History   Social History  . Marital Status: Legally Separated    Spouse Name: N/A    Number of Children: 4  . Years of Education: N/A   Occupational History  . Disables/SSI since 2002    Social History Main Topics  . Smoking status: Never Smoker   . Smokeless tobacco: Never Used  . Alcohol Use: No  . Drug Use: No  .  Sexually Active: None   Other Topics Concern  . None   Social History Narrative  . None      REVIEW OF SYSTEMS - PERTINENT POSITIVES ONLY: Review of Systems - General ROS: negative for - chills, fatigue or fever Hematological and Lymphatic ROS: negative for - bleeding problems or blood clots Respiratory ROS: no cough,  shortness of breath, or wheezing Cardiovascular ROS: no chest pain or dyspnea on exertion Gastrointestinal ROS: no abdominal pain, change in bowel habits, or black or bloody stools  EXAM: Filed Vitals:   12/17/11 1106  BP: 120/70  Pulse: 66  Temp: 97.9 F (36.6 C)  Resp: 18    General appearance: alert and cooperative Resp: clear to auscultation bilaterally Cardio: regular rate and rhythm GI: soft, non-tender; bowel sounds normal; no masses,  no organomegaly   Procedure: Anoscopy Surgeon: Maisie Fus Diagnosis: hemorrhoids  Assistant: Christella Scheuermann After the risks and benefits were explained, verbal consent was obtained for above procedure  Anesthesia: none Findings: posterior internal hemorrhoid prolapse with what appears to be condyloma or inflammatory polyps on it's surface, external skin tag, good sphincter tone    ASSESSMENT AND PLAN: I believe that her fecal leakage is due to her prolapsed hemorrhoid.  I recommended that she have this removed.  I discussed the risks and benefits of this.  This include bleeding, pain, continued leakage and need for additional procedures.  I believe that she understands these risks and has agreed to proceed with surgery.      Vanita Panda, MD Colon and Rectal Surgery / General Surgery [facility]      Visit Diagnoses: 1. Rectal mucosa prolapse     Primary Care Physician: Oliver Barre, MD

## 2011-12-19 ENCOUNTER — Ambulatory Visit (INDEPENDENT_AMBULATORY_CARE_PROVIDER_SITE_OTHER): Payer: Medicare Other | Admitting: Endocrinology

## 2011-12-19 ENCOUNTER — Encounter: Payer: Self-pay | Admitting: Endocrinology

## 2011-12-19 VITALS — BP 134/78 | HR 78 | Temp 97.8°F | Wt 192.0 lb

## 2011-12-19 DIAGNOSIS — E0590 Thyrotoxicosis, unspecified without thyrotoxic crisis or storm: Secondary | ICD-10-CM

## 2011-12-19 NOTE — Progress Notes (Signed)
Subjective:    Patient ID: Monica Lee, female    DOB: 07/28/1950, 61 y.o.   MRN: [id number]  HPI Pt is 5 1/2 months s/p i-131 rx for hyperthyroidism, due to multinodular goiter.  pt states she feels well in general. She does not notice the goiter. Past Medical History  Diagnosis Date  . CHF (congestive heart failure)   . Hypertension   . Diabetes mellitus   . Asthma   . Acid reflux   . Kidney [place]   . S/P cataract surgery   . Hx of cholecystectomy   . S/P kidney transplant   . MI (myocardial infarction)   . CAD (coronary artery disease)     native vessel  . Nephrolithiasis   . GERD (gastroesophageal reflux disease)   . Diastolic dysfunction   . Hyperlipidemia   . Anemia   . DJD (degenerative joint disease)   . TIA (transient ischemic attack)   . Hx of colonic polyps   . Hx of colonoscopy   . PUD (peptic ulcer disease)   . Blind left eye   . Diabetic retinopathy(362.0)   . Stroke   . Dialysis patient   . Dialysis patient     hx of dialysis  . Heart murmur   . Hemorrhoids   . Chills   . Sore throat   . Cough   . Diarrhea   . Abdominal pain   . Generalized headaches   . Fainting   . Weakness     Past Surgical History  Procedure Date  . Transplantation renal   . Cardiac catheterization   . Abdominal hysterectomy   . Cholecystectomy   . Cataract extraction bil  . Intestinal perforation surgery 01/2009  . Breast biopsy 2010  . Gallstones removed     History   Social History  . Marital Status: Legally Separated    Spouse Name: N/A    Number of Children: 4  . Years of Education: N/A   Occupational History  . Disables/SSI since 2002    Social History Main Topics  . Smoking status: Never Smoker   . Smokeless tobacco: Never Used  . Alcohol Use: No  . Drug Use: No  . Sexually Active: Not on file   Other Topics Concern  . Not on file   Social History Narrative  . No narrative on file    Current Outpatient Prescriptions on File Prior to Visit   Medication Sig Dispense Refill  . amLODipine (NORVASC) 10 MG tablet TAKE ONE TABLET BY MOUTH EVERY DAY  90 tablet  1  . aspirin EC 325 MG tablet Take 325 mg by mouth daily.        Marland Kitchen azaTHIOprine (IMURAN) 50 MG tablet Take 50 mg by mouth 2 (two) times daily.        . brimonidine (ALPHAGAN) 0.2 % ophthalmic solution Place 1 drop into both eyes 2 (two) times daily.        . brimonidine-timolol (COMBIGAN) 0.2-0.5 % ophthalmic solution Place 1 drop into both eyes 2 (two) times daily.        . butalbital-acetaminophen-caffeine (FIORICET, ESGIC) 50-325-40 MG per tablet Take 1 tablet by mouth every 6 (six) hours as needed.        . calcitRIOL (ROCALTROL) 0.25 MCG capsule Take 0.25 mcg by mouth daily. Take 2 tabs daily      . cloNIDine (CATAPRES) 0.2 MG tablet Take 0.2 mg by mouth 2 (two) times daily.       Marland Kitchen  furosemide (LASIX) 80 MG tablet Take 2 tablets bid      . hydrALAZINE (APRESOLINE) 50 MG tablet Take 1 tablet (50 mg total) by mouth 3 (three) times daily.  90 tablet  3  . hydrocortisone (ANUSOL-HC) 2.5 % rectal cream Insert rectally at HS  12 g  1  . insulin glargine (LANTUS) 100 UNIT/ML injection Inject 65 Units into the skin daily.  30 mL  12  . isosorbide mononitrate (IMDUR) 60 MG 24 hr tablet TAKE ONE TABLET BY MOUTH EVERY DAY  90 tablet  3  . K Phos Mono-Sod Phos Di & Mono (PHOSPHA 250 NEUTRAL PO) Take by mouth 2 (two) times daily.      Marland Kitchen KLOR-CON M20 20 MEQ tablet TAKE ONE TABLET BY MOUTH EVERY DAY,TAKE AN EXTRA TABLET FOR FLUID WEIGHT GAIN OF 3 POUNDS OR MORE DAILY AS NEEDED  60 tablet  3  . LANTUS 100 UNIT/ML injection INJECT 55 UNITS INTO THE SKIN DAILY  30 mL  11  . Magnesium Oxide 250 MG TABS Take 1 tablet by mouth daily.       . metoprolol (LOPRESSOR) 100 MG tablet TAKE ONE & ONE-HALF TABLETS BY MOUTH TWICE DAILY  90 tablet  7  . minoxidil (LONITEN) 2.5 MG tablet TAKE ONE TABLET BY MOUTH TWICE DAILY  60 tablet  2  . Multiple Vitamin (MULTIVITAMIN) capsule Take 1 capsule by mouth  daily.        Marland Kitchen NEXIUM 40 MG capsule TAKE ONE CAPSULE BY MOUTH EVERY DAY  90 each  1  . nitroGLYCERIN (NITROSTAT) 0.4 MG SL tablet Place 1 tablet (0.4 mg total) under the tongue every 5 (five) minutes as needed for chest pain.  25 tablet  11  . NOVOLOG FLEXPEN 100 UNIT/ML injection USE AS DIRECTED THREE TIMES DAILY WITH MEALS A-16-20-20  60 mL  2  . polyethylene glycol powder (GLYCOLAX/MIRALAX) powder TAKE 17 GM BY MOUTH DAILY  1054 g  10  . pravastatin (PRAVACHOL) 20 MG tablet Take 10 mg by mouth at bedtime.        . predniSONE (DELTASONE) 10 MG tablet TAKE ONE TABLET BY MOUTH EVERY DAY  90 tablet  3  . tacrolimus (PROGRAF) 1 MG capsule Take by mouth. Take 8 mg in am and 7 mg in pm      . timolol (TIMOPTIC) 0.25 % ophthalmic solution Place 1 drop into the right eye 2 (two) times daily.       . traMADol (ULTRAM) 50 MG tablet Take 1 tablet (50 mg total) by mouth every 8 (eight) hours as needed for pain.  60 tablet  2    Allergies  Allergen Reactions  . Cephalexin     REACTION: edema/face  . Propoxyphene-Acetaminophen     Family History  Problem Relation Age of Onset  . Kidney disease Neg Hx   . Arthritis Other     parent  . Heart disease Other     parent  . Hypertension Other     parent  . Diabetes Other     parent  . Diabetes Other     grandparent  . Thyroid disease Other     several  . Heart disease Father    BP 134/78  Pulse 78  Temp 97.8 F (36.6 C) (Oral)  Wt 192 lb (87.091 kg)  SpO2 98%   Review of Systems Denies weight change    Objective:   Physical Exam VITAL SIGNS:  See vs page GENERAL: no distress  Neck: there is a goiter 10x normal size, left > right, with multinodular surface.     Lab Results  Component Value Date   TSH 2.23 12/05/2011      Assessment & Plan:  Multinodular goiter, unchanged Hyperthyroidism, resolved with i-131 rx, but she is at risk for recurrence

## 2011-12-19 NOTE — Patient Instructions (Addendum)
No treatment is needed now, for your thyroid. most of the time, a "lumpy thyroid" will eventually become overactive again.  this is usually a slow process, happening over the span of many years. Please come back for a follow-up appointment in 6 months

## 2011-12-25 ENCOUNTER — Emergency Department (HOSPITAL_COMMUNITY)
Admission: EM | Admit: 2011-12-25 | Discharge: 2011-12-25 | Disposition: A | Discharge status: Home / Self Care | Payer: Medicare Other | Attending: Emergency Medicine | Admitting: Emergency Medicine

## 2011-12-25 ENCOUNTER — Emergency Department (HOSPITAL_COMMUNITY): Payer: Medicare Other

## 2011-12-25 ENCOUNTER — Encounter (HOSPITAL_COMMUNITY): Payer: Self-pay | Admitting: Family Medicine

## 2011-12-25 DIAGNOSIS — I252 Old myocardial infarction: Secondary | ICD-10-CM | POA: Insufficient documentation

## 2011-12-25 DIAGNOSIS — K219 Gastro-esophageal reflux disease without esophagitis: Secondary | ICD-10-CM | POA: Insufficient documentation

## 2011-12-25 DIAGNOSIS — H5440 Blindness, one eye, unspecified eye: Secondary | ICD-10-CM | POA: Insufficient documentation

## 2011-12-25 DIAGNOSIS — Z862 Personal history of diseases of the blood and blood-forming organs and certain disorders involving the immune mechanism: Secondary | ICD-10-CM | POA: Insufficient documentation

## 2011-12-25 DIAGNOSIS — I2510 Atherosclerotic heart disease of native coronary artery without angina pectoris: Secondary | ICD-10-CM | POA: Insufficient documentation

## 2011-12-25 DIAGNOSIS — Z87442 Personal history of urinary calculi: Secondary | ICD-10-CM | POA: Diagnosis not present

## 2011-12-25 DIAGNOSIS — E119 Type 2 diabetes mellitus without complications: Secondary | ICD-10-CM

## 2011-12-25 DIAGNOSIS — J45901 Unspecified asthma with (acute) exacerbation: Secondary | ICD-10-CM | POA: Diagnosis not present

## 2011-12-25 DIAGNOSIS — L03319 Cellulitis of trunk, unspecified: Secondary | ICD-10-CM | POA: Diagnosis not present

## 2011-12-25 DIAGNOSIS — E11319 Type 2 diabetes mellitus with unspecified diabetic retinopathy without macular edema: Secondary | ICD-10-CM | POA: Diagnosis not present

## 2011-12-25 DIAGNOSIS — N186 End stage renal disease: Secondary | ICD-10-CM | POA: Diagnosis not present

## 2011-12-25 DIAGNOSIS — J45909 Unspecified asthma, uncomplicated: Secondary | ICD-10-CM

## 2011-12-25 DIAGNOSIS — Z794 Long term (current) use of insulin: Secondary | ICD-10-CM | POA: Insufficient documentation

## 2011-12-25 DIAGNOSIS — Z79899 Other long term (current) drug therapy: Secondary | ICD-10-CM | POA: Insufficient documentation

## 2011-12-25 DIAGNOSIS — I509 Heart failure, unspecified: Secondary | ICD-10-CM | POA: Insufficient documentation

## 2011-12-25 DIAGNOSIS — E1139 Type 2 diabetes mellitus with other diabetic ophthalmic complication: Secondary | ICD-10-CM | POA: Insufficient documentation

## 2011-12-25 DIAGNOSIS — IMO0002 Reserved for concepts with insufficient information to code with codable children: Secondary | ICD-10-CM | POA: Diagnosis not present

## 2011-12-25 DIAGNOSIS — Z860100 Personal history of colon polyps, unspecified: Secondary | ICD-10-CM | POA: Insufficient documentation

## 2011-12-25 DIAGNOSIS — Z940 Kidney transplant status: Secondary | ICD-10-CM | POA: Diagnosis not present

## 2011-12-25 DIAGNOSIS — Z7982 Long term (current) use of aspirin: Secondary | ICD-10-CM | POA: Diagnosis not present

## 2011-12-25 DIAGNOSIS — Z8719 Personal history of other diseases of the digestive system: Secondary | ICD-10-CM | POA: Insufficient documentation

## 2011-12-25 DIAGNOSIS — L0390 Cellulitis, unspecified: Secondary | ICD-10-CM

## 2011-12-25 DIAGNOSIS — R21 Rash and other nonspecific skin eruption: Secondary | ICD-10-CM | POA: Diagnosis not present

## 2011-12-25 DIAGNOSIS — I120 Hypertensive chronic kidney disease with stage 5 chronic kidney disease or end stage renal disease: Secondary | ICD-10-CM | POA: Insufficient documentation

## 2011-12-25 DIAGNOSIS — I517 Cardiomegaly: Secondary | ICD-10-CM | POA: Diagnosis not present

## 2011-12-25 DIAGNOSIS — R062 Wheezing: Secondary | ICD-10-CM | POA: Insufficient documentation

## 2011-12-25 DIAGNOSIS — R011 Cardiac murmur, unspecified: Secondary | ICD-10-CM | POA: Diagnosis not present

## 2011-12-25 DIAGNOSIS — L02219 Cutaneous abscess of trunk, unspecified: Secondary | ICD-10-CM | POA: Diagnosis not present

## 2011-12-25 DIAGNOSIS — E785 Hyperlipidemia, unspecified: Secondary | ICD-10-CM | POA: Insufficient documentation

## 2011-12-25 DIAGNOSIS — Z8673 Personal history of transient ischemic attack (TIA), and cerebral infarction without residual deficits: Secondary | ICD-10-CM | POA: Diagnosis not present

## 2011-12-25 DIAGNOSIS — R05 Cough: Secondary | ICD-10-CM | POA: Diagnosis not present

## 2011-12-25 LAB — CBC WITH DIFFERENTIAL/PLATELET
Basophils Absolute: 0 10*3/uL (ref 0.0–0.1)
Basophils Relative: 0 % (ref 0–1)
Eosinophils Absolute: 0.1 10*3/uL (ref 0.0–0.7)
Eosinophils Relative: 1 % (ref 0–5)
HCT: 39.8 % (ref 36.0–46.0)
MCHC: 31.2 g/dL (ref 30.0–36.0)
MCV: 87.7 fL (ref 78.0–100.0)
Monocytes Absolute: 0.9 10*3/uL (ref 0.1–1.0)
RDW: 15.1 % (ref 11.5–15.5)

## 2011-12-25 LAB — COMPREHENSIVE METABOLIC PANEL
AST: 18 U/L (ref 0–37)
Albumin: 3.6 g/dL (ref 3.5–5.2)
Calcium: 9.3 mg/dL (ref 8.4–10.5)
Creatinine, Ser: 1.01 mg/dL (ref 0.50–1.10)
GFR calc non Af Amer: 59 mL/min — ABNORMAL LOW (ref >90)

## 2011-12-25 MED ORDER — CLINDAMYCIN HCL 150 MG PO CAPS: 450.0000 mg | ORAL_CAPSULE | Freq: Three times a day (TID) | ORAL | Status: DC

## 2011-12-25 MED ORDER — IPRATROPIUM BROMIDE 0.02 % IN SOLN
1.0000 mg | RESPIRATORY_TRACT | Status: DC
  Administered 2011-12-25: 1 mg via RESPIRATORY_TRACT
  Filled 2011-12-25: qty 5

## 2011-12-25 MED ORDER — IPRATROPIUM BROMIDE 0.02 % IN SOLN
0.5000 mg | RESPIRATORY_TRACT | Status: AC
  Administered 2011-12-25: 0.5 mg via RESPIRATORY_TRACT
  Filled 2011-12-25: qty 2.5

## 2011-12-25 MED ORDER — PREDNISONE 20 MG PO TABS
60.0000 mg | ORAL_TABLET | Freq: Once | ORAL | Status: AC
  Administered 2011-12-25: 60 mg via ORAL
  Filled 2011-12-25: qty 3

## 2011-12-25 MED ORDER — ALBUTEROL SULFATE (5 MG/ML) 0.5% IN NEBU
5.0000 mg | INHALATION_SOLUTION | RESPIRATORY_TRACT | Status: AC
  Administered 2011-12-25: 5 mg via RESPIRATORY_TRACT
  Filled 2011-12-25: qty 1

## 2011-12-25 MED ORDER — ALBUTEROL (5 MG/ML) CONTINUOUS INHALATION SOLN
10.0000 mg/h | INHALATION_SOLUTION | RESPIRATORY_TRACT | Status: DC
  Administered 2011-12-25: 10 mg/h via RESPIRATORY_TRACT
  Filled 2011-12-25: qty 20

## 2011-12-25 MED ORDER — PREDNISONE 10 MG PO TABS: 40.0000 mg | ORAL_TABLET | Freq: Every day | ORAL | Status: DC

## 2011-12-25 MED ORDER — CLINDAMYCIN PHOSPHATE 600 MG/50ML IV SOLN
600.0000 mg | Freq: Once | INTRAVENOUS | Status: AC
  Administered 2011-12-25: 600 mg via INTRAVENOUS
  Filled 2011-12-25: qty 50

## 2011-12-25 NOTE — ED Notes (Signed)
Pt ambulated in the hall without difficulty on O2 sat of 94%- 96% RA.

## 2011-12-25 NOTE — ED Provider Notes (Signed)
Patient placed in CDU by Chaney Malling, M.D. for cellulitis and asthma.  Patient is here for wheezing and infection on her abdomen and has received prednisone, albuterol, clindamycin IV.   Plan per previous provider is to allow her to finish her nebulizer treatments and IV antibiotics and discharge home if patient is better able to ambulate.  Patient re-evaluated and is resting comfortably, VSS, with no new complaints or concerns at this time.  On exam: hemodynamically stable, NAD, heart w/ RRR, lungs with mild expiratory wheezing throughout, Chest & abd non-tender, no peripheral edema or calf tenderness.  BP 132/66  Pulse 74  Temp 98.7 F (37.1 C) (Oral)  Resp 20  SpO2 99%  Discussed with patient current lab and imaging results as well as their care plan, patient questions answered.  Patient is amenable to the plan.  Patient has a second albuterol pending.   9:00 PM Reevaluation patient with mild expiratory wheezes throughout. She states she is breathing much better. She ambulates well without difficulty and maintained saturations of greater than 94% on room air with ambulation.  Will discharge him with prednisone taper and clindamycin for cellulitis. Strict followup instructions given for the cellulitis.  1. Medications: 60 mg prednisone taper, clindamycin, usual home medications  2. Treatment: rest, drink plenty of fluids, take medications as prescribed, use warm compress on cellulitis  3. Follow Up: Please followup with your primary doctor for discussion of your diagnoses and further evaluation after today's visit; if you do not have a primary care doctor use the resource guide provided to find one   Dierdre Forth, PA-C 12/25/11 2101  Richardean Canal, MD 01/28/14 1557

## 2011-12-25 NOTE — ED Provider Notes (Signed)
History   This chart was scribed for Richardean Canal, MD by Toya Smothers, ED Scribe. The patient was seen in room TR11C/TR11C. Patient's care was started at 1350.  CSN: [id number]  Arrival date & time 12/25/11  1350   First MD Initiated Contact with Patient 12/25/11 1504      Chief Complaint  Patient presents with  . Cough  . Abscess    HPI  Monica Lee is a 61 y.o. female with h/o asthma, CHF, DM, CAD, DJD, CVA, and current dialysis treatment, who presents to the Emergency Department complaining of 1 week of recurrent, constant, moderate cough and wheezing described similar to that of asthma. Pt does not recall the context of onset. Symptoms have not been treated PTA.  She is currently taking 10 mg prednisone  after kidney transplant in 2008, though denies steroid use for asthma. No leg swelling, fever, chest pain, or diaphoresis.   Pt also c/o 1 week of recurrent, gradual onset, moderate abscess to left of the periumbilical area with associated drainage and tenderness. Pain is moderate, aggravated with palpation, and alleviated by nothing. No fever, chills, cough, congestion, or n/v/d. Pt denies use of tobacco products, consumption of alcohol, and use of illicit drugs. Surgical Hx includes renal transplantation, cardiac catheterization, cholecystectomy, and gallstones.     Past Medical History  Diagnosis Date  . CHF (congestive heart failure)   . Hypertension   . Diabetes mellitus   . Asthma   . Acid reflux   . Kidney Sandberg   . S/P cataract surgery   . Hx of cholecystectomy   . S/P kidney transplant   . MI (myocardial infarction)   . CAD (coronary artery disease)     native vessel  . Nephrolithiasis   . GERD (gastroesophageal reflux disease)   . Diastolic dysfunction   . Hyperlipidemia   . Anemia   . DJD (degenerative joint disease)   . TIA (transient ischemic attack)   . Hx of colonic polyps   . Hx of colonoscopy   . PUD (peptic ulcer disease)   . Blind left eye   .  Diabetic retinopathy(362.0)   . Stroke   . Dialysis patient   . Dialysis patient     hx of dialysis  . Heart murmur   . Hemorrhoids   . Chills   . Sore throat   . Cough   . Diarrhea   . Abdominal pain   . Generalized headaches   . Fainting   . Weakness     Past Surgical History  Procedure Date  . Transplantation renal   . Cardiac catheterization   . Abdominal hysterectomy   . Cholecystectomy   . Cataract extraction bil  . Intestinal perforation surgery 01/2009  . Breast biopsy 2010  . Gallstones removed     Family History  Problem Relation Age of Onset  . Kidney disease Neg Hx   . Arthritis Other     parent  . Heart disease Other     parent  . Hypertension Other     parent  . Diabetes Other     parent  . Diabetes Other     grandparent  . Thyroid disease Other     several  . Heart disease Father     History  Substance Use Topics  . Smoking status: Never Smoker   . Smokeless tobacco: Never Used  . Alcohol Use: No    OB History    Grav Para Term Preterm  Abortions TAB SAB Ect Mult Living                  Review of Systems  Respiratory: Positive for cough and wheezing.   Skin: Positive for rash.    Allergies  Cephalexin and Propoxyphene-acetaminophen  Home Medications   Current Outpatient Rx  Name  Route  Sig  Dispense  Refill  . AMLODIPINE BESYLATE 10 MG PO TABS   Oral   Take 10 mg by mouth daily.         . ASPIRIN EC 325 MG PO TBEC   Oral   Take 325 mg by mouth daily.           . AZATHIOPRINE 50 MG PO TABS   Oral   Take 50 mg by mouth 2 (two) times daily.           Marland Kitchen BRIMONIDINE TARTRATE 0.2 % OP SOLN   Both Eyes   Place 1 drop into both eyes 2 (two) times daily.           Marland Kitchen BRIMONIDINE TARTRATE-TIMOLOL 0.2-0.5 % OP SOLN   Both Eyes   Place 1 drop into both eyes 2 (two) times daily.           Marland Kitchen CALCITRIOL 0.25 MCG PO CAPS   Oral   Take 0.25 mcg by mouth 3 (three) times daily. Take 2 tabs daily         . CLONIDINE  HCL 0.2 MG PO TABS   Oral   Take 0.2 mg by mouth 2 (two) times daily.          Marland Kitchen ESOMEPRAZOLE MAGNESIUM 40 MG PO CPDR   Oral   Take 40 mg by mouth daily before breakfast.         . FUROSEMIDE 80 MG PO TABS   Oral   Take 80 mg by mouth 2 (two) times daily.          . INSULIN ASPART 100 UNIT/ML [place] SOLN   Subcutaneous   Inject 16-20 Units into the skin 3 (three) times daily before meals. 16 units with breakfast and 20 units with lunch and dinner.         . INSULIN GLARGINE 100 UNIT/ML Roy SOLN   Subcutaneous   Inject 65 Units into the skin daily.   30 mL   12   . ISOSORBIDE MONONITRATE ER 60 MG PO TB24   Oral   Take 60 mg by mouth daily.         Marland Kitchen PHOSPHA 250 NEUTRAL PO   Oral   Take 1 tablet by mouth 2 (two) times daily.          Marland Kitchen MAGNESIUM OXIDE 250 MG PO TABS   Oral   Take 1 tablet by mouth daily.          Marland Kitchen METOPROLOL TARTRATE 100 MG PO TABS   Oral   Take 150 mg by mouth 2 (two) times daily.         Marland Kitchen MINOXIDIL 2.5 MG PO TABS   Oral   Take 2.5 mg by mouth 2 (two) times daily.         . MULTIVITAMINS PO CAPS   Oral   Take 1 capsule by mouth daily.           Marland Kitchen POLYETHYLENE GLYCOL 3350 PO PACK   Oral   Take 17 g by mouth daily.         Marland Kitchen PRAVASTATIN SODIUM 20  MG PO TABS   Oral   Take 10 mg by mouth at bedtime.           Marland Kitchen TACROLIMUS 1 MG PO CAPS   Oral   Take by mouth. Take 8 mg in am and 7 mg in pm         . TIMOLOL MALEATE 0.25 % OP SOLN   Right Eye   Place 1 drop into the right eye 2 (two) times daily.          . TRAMADOL HCL 50 MG PO TABS   Oral   Take 1 tablet (50 mg total) by mouth every 8 (eight) hours as needed for pain.   60 tablet   2   . NITROGLYCERIN 0.4 MG SL SUBL   Sublingual   Place 1 tablet (0.4 mg total) under the tongue every 5 (five) minutes as needed for chest pain.   25 tablet   11   . PREDNISONE 10 MG PO TABS      TAKE ONE TABLET BY MOUTH EVERY DAY   90 tablet   3     BP 132/66  Pulse 74   Temp 98.7 F (37.1 C) (Oral)  Resp 20  SpO2 96%  Physical Exam  Nursing note and vitals reviewed. Constitutional: She is oriented to person, place, and time. She appears well-developed and well-nourished.  HENT:  Head: Normocephalic.  Mouth/Throat: Oropharynx is clear and moist.  Eyes: Conjunctivae normal are normal. Pupils are equal, round, and reactive to light.  Neck: Normal range of motion. Neck supple.  Cardiovascular: Normal rate, regular rhythm and normal heart sounds.   Pulmonary/Chest: Effort normal.       + diffuse wheezing, no crackles   Abdominal: Soft. Bowel sounds are normal.       Small area of cellulitis in mid abdomen, firm around the area. No fluctuance. Abdomen soft and nontender.   Musculoskeletal: Normal range of motion. She exhibits no edema and no tenderness.  Neurological: She is alert and oriented to person, place, and time.  Skin: Skin is warm and dry.       See abdominal exam.   Psychiatric: She has a normal mood and affect. Her behavior is normal. Judgment and thought content normal.    ED Course  Procedures DIAGNOSTIC STUDIES: Oxygen Saturation is 96% on room air, normal by my interpretation.    COORDINATION OF CARE: 15:11- Evaluated Pt. Pt is awake, alert, and without distress. 15:15- Patient understand and agree with initial ED impression and plan with expectations set for ED visit.    Labs Reviewed - No data to display Dg Chest 2 View  12/25/2011  *RADIOLOGY REPORT*  Clinical Data: Cough.  Evaluate for lung abscess.  CHEST - 2 VIEW  Comparison: 10/22/2010  Findings: Cardiac enlargement is stable from previous exam.  There is chronic asymmetric elevation of the right hemidiaphragm.  No pleural effusion or pulmonary edema.  Mild spondylosis within the thoracic spine.  IMPRESSION:  1.  Cardiac enlargement. 2.  Asymmetric elevation of the right hemidiaphragm.,   Original Report Authenticated By: Signa Kell, M.D.      No diagnosis  found.    MDM  Monica Lee is a 61 y.o. female here with wheezing and cellulitis. Wheezing is likely from asthma exacerbation. However, given hx of prednisone use for kidney transplant, will need to get labs. Will also give steroids and nebs. I transferred her to CDU to observe for improvement. She also has superficial  abdominal cellulitis with no evidence of abscess currently. Will treat with clinda given possible hx of MRSA. I gave report to CDU PA.    I personally performed the services described in this documentation, which was scribed in my presence. The recorded information has been reviewed and is accurate.    Richardean Canal, MD 12/25/11 [id number]

## 2011-12-25 NOTE — ED Notes (Signed)
Per pt she has been coughing and wheezing at night for about 1 week. sts abscess to abdomen since last Thursday. sts some drainage.

## 2011-12-27 ENCOUNTER — Encounter: Payer: Self-pay | Admitting: Internal Medicine

## 2011-12-27 ENCOUNTER — Ambulatory Visit (INDEPENDENT_AMBULATORY_CARE_PROVIDER_SITE_OTHER): Payer: Medicare Other | Admitting: Internal Medicine

## 2011-12-27 VITALS — BP 142/80 | HR 66 | Temp 98.0°F | Ht 65.0 in | Wt 195.1 lb

## 2011-12-27 DIAGNOSIS — L02219 Cutaneous abscess of trunk, unspecified: Secondary | ICD-10-CM | POA: Diagnosis not present

## 2011-12-27 DIAGNOSIS — J45901 Unspecified asthma with (acute) exacerbation: Secondary | ICD-10-CM

## 2011-12-27 DIAGNOSIS — L03319 Cellulitis of trunk, unspecified: Secondary | ICD-10-CM | POA: Diagnosis not present

## 2011-12-27 DIAGNOSIS — E119 Type 2 diabetes mellitus without complications: Secondary | ICD-10-CM

## 2011-12-27 DIAGNOSIS — L03311 Cellulitis of abdominal wall: Secondary | ICD-10-CM

## 2011-12-27 DIAGNOSIS — I10 Essential (primary) hypertension: Secondary | ICD-10-CM | POA: Diagnosis not present

## 2011-12-27 NOTE — Patient Instructions (Addendum)
Please continue and finish your treatment as you are doing. Continue all other medications as before Please have the pharmacy call with any other refills you may need. No further lab work needs done today Please continue your efforts at being more active, low cholesterol diet, and weight control. Please keep your appointments with your specialists as you have planned - surgury for the hemorrhoids Please remember to sign up for My Chart at your earliest convenience, as this will be important to you in the future with finding out test results.

## 2011-12-28 ENCOUNTER — Encounter: Payer: Self-pay | Admitting: Internal Medicine

## 2011-12-28 DIAGNOSIS — L03311 Cellulitis of abdominal wall: Secondary | ICD-10-CM | POA: Insufficient documentation

## 2011-12-28 DIAGNOSIS — J45901 Unspecified asthma with (acute) exacerbation: Secondary | ICD-10-CM | POA: Insufficient documentation

## 2011-12-28 NOTE — Assessment & Plan Note (Signed)
stable overall by hx and exam but could use better control, most recent data reviewed with pt, and pt to continue medical treatment as before, for better diet, cont meds and call for cbg on steroid > 200 Lab Results  Component Value Date   HGBA1C 8.6* 12/05/2011

## 2011-12-28 NOTE — Assessment & Plan Note (Signed)
Pt cont's to improved on current pred taper, to cont as planned,  to f/u any worsening symptoms or concerns SpO2 Readings from Last 3 Encounters:  12/27/11 95%  12/25/11 97%  12/19/11 98%

## 2011-12-28 NOTE — Assessment & Plan Note (Signed)
stable overall by hx and exam, most recent data reviewed with pt, and pt to continue medical treatment as before BP Readings from Last 3 Encounters:  12/27/11 142/80  12/25/11 140/72  12/19/11 134/78

## 2011-12-28 NOTE — Assessment & Plan Note (Addendum)
Cont's to improve, to finish oral antibx, recent labs reviewed with pt,  to f/u any worsening symptoms or concerns

## 2011-12-28 NOTE — Progress Notes (Signed)
Subjective:    Patient ID: Monica Lee, female    DOB: [birth year], 61 y.o.   MRN: [id number]  HPI  Here after recent stay in the CDU for asthma exacerbation, and left abd cellulitis, improved overnight and d/c on oral meds. Overall good compliance with treatment, and good medicine tolerability, including the antibiotic wtihout diarrhea or other.  Overall feels gradually improved with less wheezing/sob and Pt denies chest pain, increased sob or doe, wheezing, orthopnea, PND, increased LE swelling, palpitations, dizziness or syncope.  Also left abd area much improved induration/tenderness/erythema/size without fever, chills, drainage.  Denies worsening reflux, dysphagia, abd pain, n/v, bowel change or blood.   Past Medical History  Diagnosis Date  . CHF (congestive heart failure)   . Hypertension   . Diabetes mellitus   . Asthma   . Acid reflux   . Kidney [place]okosz   . S/P cataract surgery   . Hx of cholecystectomy   . S/P kidney transplant   . MI (myocardial infarction)   . CAD (coronary artery disease)     native vessel  . Nephrolithiasis   . GERD (gastroesophageal reflux disease)   . Diastolic dysfunction   . Hyperlipidemia   . Anemia   . DJD (degenerative joint disease)   . TIA (transient ischemic attack)   . Hx of colonic polyps   . Hx of colonoscopy   . PUD (peptic ulcer disease)   . Blind left eye   . Diabetic retinopathy(362.0)   . Stroke   . Dialysis patient   . Dialysis patient     hx of dialysis  . Heart murmur   . Hemorrhoids   . Chills   . Sore throat   . Cough   . Diarrhea   . Abdominal pain   . Generalized headaches   . Fainting   . Weakness    Past Surgical History  Procedure Date  . Transplantation renal   . Cardiac catheterization   . Abdominal hysterectomy   . Cholecystectomy   . Cataract extraction bil  . Intestinal perforation surgery 01/2009  . Breast biopsy 2010  . Gallstones removed     reports that she has never smoked. She has never used  smokeless tobacco. She reports that she does not drink alcohol or use illicit drugs. family history includes Arthritis in her other; Diabetes in her others; Heart disease in her father and other; Hypertension in her other; and Thyroid disease in her other.  There is no history of Kidney disease. Allergies  Allergen Reactions  . Cephalexin     REACTION: edema/face  . Propoxyphene-Acetaminophen    Current Outpatient Prescriptions on File Prior to Visit  Medication Sig Dispense Refill  . amLODipine (NORVASC) 10 MG tablet Take 10 mg by mouth daily.      Marland Kitchen aspirin EC 325 MG tablet Take 325 mg by mouth daily.        Marland Kitchen azaTHIOprine (IMURAN) 50 MG tablet Take 50 mg by mouth 2 (two) times daily.        . brimonidine (ALPHAGAN) 0.2 % ophthalmic solution Place 1 drop into both eyes 2 (two) times daily.        . brimonidine-timolol (COMBIGAN) 0.2-0.5 % ophthalmic solution Place 1 drop into both eyes 2 (two) times daily.        . calcitRIOL (ROCALTROL) 0.25 MCG capsule Take 0.25 mcg by mouth 3 (three) times daily. Take 2 tabs daily      . clindamycin (CLEOCIN) 150 MG capsule  Take 3 capsules (450 mg total) by mouth 3 (three) times daily.  90 capsule  0  . cloNIDine (CATAPRES) 0.2 MG tablet Take 0.2 mg by mouth 2 (two) times daily.       Marland Kitchen esomeprazole (NEXIUM) 40 MG capsule Take 40 mg by mouth daily before breakfast.      . furosemide (LASIX) 80 MG tablet Take 80 mg by mouth 2 (two) times daily.       . insulin aspart (NOVOLOG) 100 UNIT/ML injection Inject 16-20 Units into the skin 3 (three) times daily before meals. 16 units with breakfast and 20 units with lunch and dinner.      . insulin glargine (LANTUS) 100 UNIT/ML injection Inject 65 Units into the skin daily.  30 mL  12  . isosorbide mononitrate (IMDUR) 60 MG 24 hr tablet Take 60 mg by mouth daily.      . K Phos Mono-Sod Phos Di & Mono (PHOSPHA 250 NEUTRAL PO) Take 1 tablet by mouth 2 (two) times daily.       . Magnesium Oxide 250 MG TABS Take 1  tablet by mouth daily.       . metoprolol (LOPRESSOR) 100 MG tablet Take 150 mg by mouth 2 (two) times daily.      . minoxidil (LONITEN) 2.5 MG tablet Take 2.5 mg by mouth 2 (two) times daily.      . Multiple Vitamin (MULTIVITAMIN) capsule Take 1 capsule by mouth daily.        . nitroGLYCERIN (NITROSTAT) 0.4 MG SL tablet Place 1 tablet (0.4 mg total) under the tongue every 5 (five) minutes as needed for chest pain.  25 tablet  11  . polyethylene glycol (MIRALAX / GLYCOLAX) packet Take 17 g by mouth daily.      . pravastatin (PRAVACHOL) 20 MG tablet Take 10 mg by mouth at bedtime.        . predniSONE (DELTASONE) 10 MG tablet TAKE ONE TABLET BY MOUTH EVERY DAY  90 tablet  3  . predniSONE (DELTASONE) 10 MG tablet Take 4 tablets (40 mg total) by mouth daily. Take 60mg  by mouth daily for 3 days, then 50 mg by mouth daily for 2 days, then 40mg  by mouth daily for 2 days, then 30mg  daily for 2 days,  Then 20mg  daily for 2 days,  then 10mg  daily for 2 days  48 tablet  0  . tacrolimus (PROGRAF) 1 MG capsule Take by mouth. Take 8 mg in am and 7 mg in pm      . timolol (TIMOPTIC) 0.25 % ophthalmic solution Place 1 drop into the right eye 2 (two) times daily.       . traMADol (ULTRAM) 50 MG tablet Take 1 tablet (50 mg total) by mouth every 8 (eight) hours as needed for pain.  60 tablet  2   Review of Systems  Constitutional: Negative for diaphoresis and unexpected weight change.  HENT: Negative for tinnitus.   Eyes: Negative for photophobia and visual disturbance.  Respiratory: Negative for choking and stridor.   Gastrointestinal: Negative for vomiting and blood in stool.  Genitourinary: Negative for hematuria and decreased urine volume.  Musculoskeletal: Negative for gait problem.  Skin: Negative for worsening color change.  Neurological: Negative for tremors and numbness.  Psychiatric/Behavioral: Negative for decreased concentration. The patient is not hyperactive.       Objective:   Physical  Exam BP 142/80  Pulse 66  Temp 98 F (36.7 C) (Oral)  Ht 5\' 5"  (  1.651 m)  Wt 195 lb 2 oz (88.508 kg)  BMI 32.47 kg/m2  SpO2 95% Physical Exam  VS noted, not ill appearing Constitutional: Pt appears well-developed and well-nourished.  HENT: Head: Normocephalic.  Right Ear: External ear normal.  Left Ear: External ear normal.  Eyes: Conjunctivae and EOM are normal. Pupils are equal, round, and reactive to light.  Neck: Normal range of motion. Neck supple.  Cardiovascular: Normal rate and regular rhythm.   Pulmonary/Chest: Effort normal and breath sounds normal.- no rales or wheezing  Abd:  Soft, NT, non-distended, + BS except for mild tender area cellulitis left abd approx 2 cm area induration/mild tender, no fluctuance or drainage Neurological: Pt is alert. Not confused  Skin: Skin is warm. No erythema.  Psychiatric: Pt behavior is normal. Thought content normal.     Assessment & Plan:

## 2012-01-02 DIAGNOSIS — H44529 Atrophy of globe, unspecified eye: Secondary | ICD-10-CM | POA: Diagnosis not present

## 2012-01-02 DIAGNOSIS — Z79899 Other long term (current) drug therapy: Secondary | ICD-10-CM | POA: Diagnosis not present

## 2012-01-02 DIAGNOSIS — E11359 Type 2 diabetes mellitus with proliferative diabetic retinopathy without macular edema: Secondary | ICD-10-CM | POA: Diagnosis not present

## 2012-01-02 DIAGNOSIS — Z940 Kidney transplant status: Secondary | ICD-10-CM | POA: Diagnosis not present

## 2012-01-02 DIAGNOSIS — H2510 Age-related nuclear cataract, unspecified eye: Secondary | ICD-10-CM | POA: Diagnosis not present

## 2012-01-02 DIAGNOSIS — E1165 Type 2 diabetes mellitus with hyperglycemia: Secondary | ICD-10-CM | POA: Diagnosis not present

## 2012-01-02 DIAGNOSIS — E213 Hyperparathyroidism, unspecified: Secondary | ICD-10-CM | POA: Diagnosis not present

## 2012-01-16 ENCOUNTER — Other Ambulatory Visit (INDEPENDENT_AMBULATORY_CARE_PROVIDER_SITE_OTHER): Payer: Self-pay | Admitting: General Surgery

## 2012-01-20 DIAGNOSIS — I10 Essential (primary) hypertension: Secondary | ICD-10-CM | POA: Diagnosis not present

## 2012-01-20 DIAGNOSIS — N2581 Secondary hyperparathyroidism of renal origin: Secondary | ICD-10-CM | POA: Diagnosis not present

## 2012-01-20 DIAGNOSIS — E119 Type 2 diabetes mellitus without complications: Secondary | ICD-10-CM | POA: Diagnosis not present

## 2012-01-20 DIAGNOSIS — Z940 Kidney transplant status: Secondary | ICD-10-CM | POA: Diagnosis not present

## 2012-01-20 DIAGNOSIS — E213 Hyperparathyroidism, unspecified: Secondary | ICD-10-CM | POA: Diagnosis not present

## 2012-01-21 ENCOUNTER — Encounter (HOSPITAL_BASED_OUTPATIENT_CLINIC_OR_DEPARTMENT_OTHER): Payer: Self-pay | Admitting: *Deleted

## 2012-01-21 DIAGNOSIS — J45909 Unspecified asthma, uncomplicated: Secondary | ICD-10-CM | POA: Diagnosis not present

## 2012-01-21 DIAGNOSIS — I252 Old myocardial infarction: Secondary | ICD-10-CM | POA: Diagnosis not present

## 2012-01-21 DIAGNOSIS — Z940 Kidney transplant status: Secondary | ICD-10-CM | POA: Diagnosis not present

## 2012-01-21 DIAGNOSIS — E1139 Type 2 diabetes mellitus with other diabetic ophthalmic complication: Secondary | ICD-10-CM | POA: Diagnosis not present

## 2012-01-21 DIAGNOSIS — E11319 Type 2 diabetes mellitus with unspecified diabetic retinopathy without macular edema: Secondary | ICD-10-CM | POA: Diagnosis not present

## 2012-01-21 DIAGNOSIS — Z79899 Other long term (current) drug therapy: Secondary | ICD-10-CM | POA: Diagnosis not present

## 2012-01-21 DIAGNOSIS — Z8673 Personal history of transient ischemic attack (TIA), and cerebral infarction without residual deficits: Secondary | ICD-10-CM | POA: Diagnosis not present

## 2012-01-21 DIAGNOSIS — Z7982 Long term (current) use of aspirin: Secondary | ICD-10-CM | POA: Diagnosis not present

## 2012-01-21 DIAGNOSIS — E785 Hyperlipidemia, unspecified: Secondary | ICD-10-CM | POA: Diagnosis not present

## 2012-01-21 DIAGNOSIS — K219 Gastro-esophageal reflux disease without esophagitis: Secondary | ICD-10-CM | POA: Diagnosis not present

## 2012-01-21 DIAGNOSIS — Z992 Dependence on renal dialysis: Secondary | ICD-10-CM | POA: Diagnosis not present

## 2012-01-21 DIAGNOSIS — I2510 Atherosclerotic heart disease of native coronary artery without angina pectoris: Secondary | ICD-10-CM | POA: Diagnosis not present

## 2012-01-21 DIAGNOSIS — I10 Essential (primary) hypertension: Secondary | ICD-10-CM | POA: Diagnosis not present

## 2012-01-21 DIAGNOSIS — K648 Other hemorrhoids: Secondary | ICD-10-CM | POA: Diagnosis not present

## 2012-01-21 NOTE — Progress Notes (Addendum)
To [facility] at 0945- Istat on arrival,Ekg,Cxr in epic-Npo after Mn-will take  Prednisone,metoprolol,nexium,isosorbide,amlodipine,prograf with small amt water that morning-no insulin or lasix that am,will check her blood sugar as usual at home-has glucose gel if needed if blood sugar low. Chart reviewed with Dr Tonia Ghent changes in preop needs.

## 2012-01-22 ENCOUNTER — Encounter (HOSPITAL_BASED_OUTPATIENT_CLINIC_OR_DEPARTMENT_OTHER): Payer: Self-pay | Admitting: *Deleted

## 2012-01-23 ENCOUNTER — Encounter (HOSPITAL_BASED_OUTPATIENT_CLINIC_OR_DEPARTMENT_OTHER)
Admission: RE | Disposition: A | Discharge status: Home / Self Care | Payer: Self-pay | Source: Ambulatory Visit | Attending: General Surgery

## 2012-01-23 ENCOUNTER — Encounter (HOSPITAL_BASED_OUTPATIENT_CLINIC_OR_DEPARTMENT_OTHER): Payer: Self-pay | Admitting: *Deleted

## 2012-01-23 ENCOUNTER — Encounter (HOSPITAL_BASED_OUTPATIENT_CLINIC_OR_DEPARTMENT_OTHER): Payer: Self-pay | Admitting: Anesthesiology

## 2012-01-23 ENCOUNTER — Ambulatory Visit (HOSPITAL_BASED_OUTPATIENT_CLINIC_OR_DEPARTMENT_OTHER): Payer: Medicare Other | Admitting: Anesthesiology

## 2012-01-23 ENCOUNTER — Ambulatory Visit (HOSPITAL_BASED_OUTPATIENT_CLINIC_OR_DEPARTMENT_OTHER)
Admission: RE | Admit: 2012-01-23 | Discharge: 2012-01-23 | Disposition: A | Discharge status: Home / Self Care | Payer: Medicare Other | Source: Ambulatory Visit | Attending: General Surgery | Admitting: General Surgery

## 2012-01-23 DIAGNOSIS — E1139 Type 2 diabetes mellitus with other diabetic ophthalmic complication: Secondary | ICD-10-CM | POA: Insufficient documentation

## 2012-01-23 DIAGNOSIS — E11319 Type 2 diabetes mellitus with unspecified diabetic retinopathy without macular edema: Secondary | ICD-10-CM | POA: Insufficient documentation

## 2012-01-23 DIAGNOSIS — I252 Old myocardial infarction: Secondary | ICD-10-CM | POA: Diagnosis not present

## 2012-01-23 DIAGNOSIS — K648 Other hemorrhoids: Secondary | ICD-10-CM | POA: Diagnosis not present

## 2012-01-23 DIAGNOSIS — I2510 Atherosclerotic heart disease of native coronary artery without angina pectoris: Secondary | ICD-10-CM | POA: Insufficient documentation

## 2012-01-23 DIAGNOSIS — I10 Essential (primary) hypertension: Secondary | ICD-10-CM | POA: Insufficient documentation

## 2012-01-23 DIAGNOSIS — K219 Gastro-esophageal reflux disease without esophagitis: Secondary | ICD-10-CM | POA: Insufficient documentation

## 2012-01-23 DIAGNOSIS — K6289 Other specified diseases of anus and rectum: Secondary | ICD-10-CM | POA: Diagnosis not present

## 2012-01-23 DIAGNOSIS — K649 Unspecified hemorrhoids: Secondary | ICD-10-CM | POA: Diagnosis not present

## 2012-01-23 DIAGNOSIS — Z992 Dependence on renal dialysis: Secondary | ICD-10-CM | POA: Insufficient documentation

## 2012-01-23 DIAGNOSIS — Z7982 Long term (current) use of aspirin: Secondary | ICD-10-CM | POA: Insufficient documentation

## 2012-01-23 DIAGNOSIS — Z79899 Other long term (current) drug therapy: Secondary | ICD-10-CM | POA: Insufficient documentation

## 2012-01-23 DIAGNOSIS — J45909 Unspecified asthma, uncomplicated: Secondary | ICD-10-CM | POA: Insufficient documentation

## 2012-01-23 DIAGNOSIS — Z8673 Personal history of transient ischemic attack (TIA), and cerebral infarction without residual deficits: Secondary | ICD-10-CM | POA: Insufficient documentation

## 2012-01-23 DIAGNOSIS — Z940 Kidney transplant status: Secondary | ICD-10-CM | POA: Insufficient documentation

## 2012-01-23 DIAGNOSIS — E785 Hyperlipidemia, unspecified: Secondary | ICD-10-CM | POA: Insufficient documentation

## 2012-01-23 HISTORY — PX: HEMORRHOID SURGERY: SHX153

## 2012-01-23 LAB — POCT I-STAT, CHEM 8
BUN: 26 mg/dL — ABNORMAL HIGH (ref 6–23)
Calcium, Ion: 1.25 mmol/L (ref 1.13–1.30)
Chloride: 102 mEq/L (ref 96–112)
Creatinine, Ser: 1.4 mg/dL — ABNORMAL HIGH (ref 0.50–1.10)
TCO2: 32 mmol/L (ref 0–100)

## 2012-01-23 SURGERY — HEMORRHOIDECTOMY PROLAPSED
Anesthesia: Monitor Anesthesia Care | Site: Rectum | Wound class: Contaminated

## 2012-01-23 MED ORDER — ONDANSETRON HCL 4 MG/2ML IJ SOLN
INTRAMUSCULAR | Status: DC | PRN
  Administered 2012-01-23: 4 mg via INTRAVENOUS

## 2012-01-23 MED ORDER — KETAMINE HCL 50 MG/ML IJ SOLN
INTRAMUSCULAR | Status: DC | PRN
  Administered 2012-01-23: 24 mg via INTRAMUSCULAR

## 2012-01-23 MED ORDER — OXYCODONE HCL 5 MG PO TABS: 5.0000 mg | ORAL_TABLET | ORAL | Status: DC | PRN

## 2012-01-23 MED ORDER — PROPOFOL 10 MG/ML IV EMUL
INTRAVENOUS | Status: DC | PRN
  Administered 2012-01-23: 50 ug/kg/min via INTRAVENOUS

## 2012-01-23 MED ORDER — SODIUM CHLORIDE 0.9 % IJ SOLN
3.0000 mL | INTRAMUSCULAR | Status: DC | PRN
  Filled 2012-01-23: qty 3

## 2012-01-23 MED ORDER — SODIUM CHLORIDE 0.9 % IJ SOLN
INTRAMUSCULAR | Status: DC | PRN
  Administered 2012-01-23: 12:00:00

## 2012-01-23 MED ORDER — BUPIVACAINE HCL 0.5 % IJ SOLN
INTRAMUSCULAR | Status: DC | PRN
  Administered 2012-01-23: 14 mL

## 2012-01-23 MED ORDER — OXYCODONE HCL 5 MG PO TABS
5.0000 mg | ORAL_TABLET | ORAL | Status: DC | PRN
  Filled 2012-01-23: qty 2

## 2012-01-23 MED ORDER — BUPIVACAINE LIPOSOME 1.3 % IJ SUSP
20.0000 mL | Freq: Once | INTRAMUSCULAR | Status: DC
  Filled 2012-01-23: qty 20

## 2012-01-23 MED ORDER — FENTANYL CITRATE 0.05 MG/ML IJ SOLN
INTRAMUSCULAR | Status: DC | PRN
  Administered 2012-01-23 (×2): 25 ug via INTRAVENOUS

## 2012-01-23 MED ORDER — SODIUM CHLORIDE 0.9 % IJ SOLN
3.0000 mL | Freq: Two times a day (BID) | INTRAMUSCULAR | Status: DC
  Filled 2012-01-23: qty 3

## 2012-01-23 MED ORDER — SODIUM CHLORIDE 0.9 % IV SOLN
INTRAVENOUS | Status: DC
  Administered 2012-01-23 (×2): via INTRAVENOUS
  Filled 2012-01-23: qty 1000

## 2012-01-23 MED ORDER — ONDANSETRON HCL 4 MG/2ML IJ SOLN
4.0000 mg | Freq: Four times a day (QID) | INTRAMUSCULAR | Status: DC | PRN
  Filled 2012-01-23: qty 2

## 2012-01-23 MED ORDER — DOCUSATE SODIUM 100 MG PO CAPS: 100.0000 mg | ORAL_CAPSULE | Freq: Two times a day (BID) | ORAL | Status: DC

## 2012-01-23 MED ORDER — MIDAZOLAM HCL 5 MG/5ML IJ SOLN
INTRAMUSCULAR | Status: DC | PRN
  Administered 2012-01-23 (×2): 0.5 mg via INTRAVENOUS

## 2012-01-23 MED ORDER — SODIUM CHLORIDE 0.9 % IV SOLN
250.0000 mL | INTRAVENOUS | Status: DC | PRN
  Filled 2012-01-23: qty 250

## 2012-01-23 SURGICAL SUPPLY — 37 items
BLADE EXTENDED COATED 6.5IN (ELECTRODE) IMPLANT
BLADE HEX COATED 2.75 (ELECTRODE) ×2 IMPLANT
CLOTH BEACON ORANGE TIMEOUT ST (SAFETY) ×2 IMPLANT
COVER MAYO STAND STRL (DRAPES) ×2 IMPLANT
COVER TABLE BACK 60X90 (DRAPES) ×2 IMPLANT
DECANTER SPIKE VIAL GLASS SM (MISCELLANEOUS) ×2 IMPLANT
DRAPE LAPAROTOMY T 102X78X121 (DRAPES) ×2 IMPLANT
DRSG PAD ABDOMINAL 8X10 ST (GAUZE/BANDAGES/DRESSINGS) ×4 IMPLANT
ELECT REM PT RETURN 9FT ADLT (ELECTROSURGICAL) ×2
ELECTRODE REM PT RTRN 9FT ADLT (ELECTROSURGICAL) ×1 IMPLANT
GAUZE SPONGE 4X4 12PLY STRL LF (GAUZE/BANDAGES/DRESSINGS) ×2 IMPLANT
GAUZE SPONGE 4X4 16PLY XRAY LF (GAUZE/BANDAGES/DRESSINGS) ×2 IMPLANT
GLOVE BIO SURGEON STRL SZ 6.5 (GLOVE) ×2 IMPLANT
GLOVE BIOGEL PI IND STRL 6.5 (GLOVE) ×1 IMPLANT
GLOVE BIOGEL PI IND STRL 7.0 (GLOVE) ×2 IMPLANT
GLOVE BIOGEL PI INDICATOR 6.5 (GLOVE) ×1
GLOVE BIOGEL PI INDICATOR 7.0 (GLOVE) ×2
GLOVE ECLIPSE 6.5 STRL STRAW (GLOVE) ×2 IMPLANT
GLOVE INDICATOR 6.5 STRL GRN (GLOVE) ×2 IMPLANT
GOWN PREVENTION PLUS XXLARGE (GOWN DISPOSABLE) ×2 IMPLANT
GOWN STRL NON-REIN LRG LVL3 (GOWN DISPOSABLE) ×4 IMPLANT
NEEDLE HYPO 22GX1.5 SAFETY (NEEDLE) ×2 IMPLANT
NEEDLE HYPO 25X1 1.5 SAFETY (NEEDLE) ×2 IMPLANT
NS IRRIG 500ML POUR BTL (IV SOLUTION) ×2 IMPLANT
PACK BASIN DAY SURGERY FS (CUSTOM PROCEDURE TRAY) ×2 IMPLANT
PENCIL BUTTON HOLSTER BLD 10FT (ELECTRODE) ×2 IMPLANT
SPONGE GAUZE 4X4 12PLY (GAUZE/BANDAGES/DRESSINGS) ×2 IMPLANT
SPONGE SURGIFOAM ABS GEL 100 (HEMOSTASIS) IMPLANT
SPONGE SURGIFOAM ABS GEL 12-7 (HEMOSTASIS) IMPLANT
STAPLER PROXIMATE HCS (STAPLE) IMPLANT
STAPLER VISISTAT 35W (STAPLE) IMPLANT
SUT PROLENE 2 0 BLUE (SUTURE) ×4 IMPLANT
SUT VIC AB 4-0 SH 18 (SUTURE) IMPLANT
SYR CONTROL 10ML LL (SYRINGE) ×4 IMPLANT
TRAY DSU PREP LF (CUSTOM PROCEDURE TRAY) ×2 IMPLANT
WATER STERILE IRR 500ML POUR (IV SOLUTION) ×2 IMPLANT
YANKAUER SUCT BULB TIP NO VENT (SUCTIONS) ×2 IMPLANT

## 2012-01-23 NOTE — Op Note (Signed)
01/23/2012  12:32 PM  PATIENT:  Monica Lee  62 y.o. female  Patient Care Team: Corwin Levins, MD as PCP - General Gaylord Shih, MD (Cardiology) Cecille Aver, MD (Nephrology)  PRE-OPERATIVE DIAGNOSIS:  prolapsed internal hemorrhoid  POST-OPERATIVE DIAGNOSIS:  prolapsed internal hemorrhoid  PROCEDURE:  Procedure(s): HEMORRHOIDECTOMY PROLAPSED  SURGEON:  Surgeon(s): Romie Levee, MD  ASSISTANT: none   ANESTHESIA:   local and MAC  EBL: 30ml  Total I/O In: 600 [I.V.:600] Out: -    SPECIMEN:  Source of Specimen:  Right anterior skin tag and prolapsed rectum  DISPOSITION OF SPECIMEN:  PATHOLOGY  COUNTS:  YES  PLAN OF CARE: Discharge to home after PACU  PATIENT DISPOSITION:  PACU - hemodynamically stable.  INDICATION: this is a 62 year old female who presents to my office with complaints of fecal leakage. She has a area of prolapsed tissue that was thought to be the area of concern. The risk and benefits of a resection to correct this problem were explained to the patient per the OR consent was signed and placed on chart.  OR FINDINGS: prolapsed rectal mucosa with inflammatory changes and a large right anterior anal skin tag  DESCRIPTION: the patient was brought to the operating room and laid prone on the operating room table in jackknife position. MAC sedation was performed. Her buttocks were gently taped apart and she is prepped and draped in the usual sterile fashion. A surgical timeout was performed indicating the correct patient, procedure, positioning and it for preoperative and buttocks. SCDs were also noted to be in place prior to the initiation of anesthesia. I began by performing a rectal block using subcutaneous lidocaine without epi after this was completed and the patient was pain-free, we began by inspecting the anal canal. A Hill-Ferguson anoscope was inserted. The anal canal is evaluated. There are no signs of additional pathology. The prolapsed mucosa  and skin tag were encompassed in a Buie clamp. A 2-0 chromic suture was used to create hemostasis underneath the clamp. Once the suture was tied the tissue above the clamp was incised using a scalpel. The chromic suture was then used to close the rectal mucosa and tack this back inside the rectum. The skin incision was then closed using a 3-0 chromic suture. This was done in a running fashion. Once this was completed hemostasis was achieved. The rectal canal was irrigated. Experel was infused subcutaneously for added local anesthesia long-term.  A sterile dressing was applied.  The patient was awakened from anesthesia and sent to the postanesthesia care unit stable condition. All counts correct per operating room staff.

## 2012-01-23 NOTE — Anesthesia Procedure Notes (Signed)
Procedure Name: MAC Date/Time: 01/23/2012 11:30 AM Performed by: Fran Lowes Pre-anesthesia Checklist: Patient identified, Emergency Drugs available, Suction available and Patient being monitored Oxygen Delivery Method: Simple face mask Intubation Type: IV induction

## 2012-01-23 NOTE — Transfer of Care (Signed)
Immediate Anesthesia Transfer of Care Note  Patient: Monica Lee  Procedure(s) Performed: Procedure(s) (LRB): HEMORRHOIDECTOMY PROLAPSED (N/A)  Patient Location: Patient transported to PACU with oxygen via face mask at 3 Liters / Min  Anesthesia Type: MAC  Level of Consciousness: awake and alert   Airway & Oxygen Therapy: Patient Spontanous Breathing and Patient connected to face mask oxygen  Post-op Assessment: Report given to PACU RN and Post -op Vital signs reviewed and stable  Post vital signs: Reviewed and stable  Complications: No apparent anesthesia complications

## 2012-01-23 NOTE — Anesthesia Preprocedure Evaluation (Addendum)
Anesthesia Evaluation    Airway Mallampati: II TM Distance: >3 FB Neck ROM: Full    Dental  (+) Teeth Intact, Poor Dentition, Dental Advisory Given, Caps and Missing,    Pulmonary asthma ,  breath sounds clear to auscultation  Pulmonary exam normal       Cardiovascular hypertension, Pt. on medications and Pt. on home beta blockers + CAD, + Past MI and +CHF Rhythm:Regular Rate:Normal     Neuro/Psych  Headaches, TIACVA, No Residual Symptoms    GI/Hepatic PUD, GERD-  ,  Endo/Other  diabetes, Type 2, Insulin DependentHyperthyroidism   Renal/GU Renal disease (Renal transplant)     Musculoskeletal   Abdominal (+) + obese,   Peds  Hematology   Anesthesia Other Findings   Reproductive/Obstetrics                          Anesthesia Physical Anesthesia Plan  ASA: III  Anesthesia Plan: General and MAC   Post-op Pain Management:    Induction: Intravenous  Airway Management Planned: LMA  Additional Equipment:   Intra-op Plan:   Post-operative Plan: Extubation in OR  Informed Consent: I have reviewed the patients History and Physical, chart, labs and discussed the procedure including the risks, benefits and alternatives for the proposed anesthesia with the patient or authorized representative who has indicated his/her understanding and acceptance.   Dental advisory given  Plan Discussed with: CRNA  Anesthesia Plan Comments: (Will discuss general vs MAC with surgeon. Risk and benefits of both discussed with patient and all questions answered. Patient understands and elects to proceed.)       Anesthesia Quick Evaluation

## 2012-01-23 NOTE — H&P (Signed)
Chief Complaint   Patient presents with   .  Hemorrhoids   HISTORY: Monica Lee is a 62 y.o. female who presents to the office with prolapasing anal tissue. Other symptoms include occassional bleeding. She has tried hemorrhoid cream and suppositories in the past with little success. Sitting for a long time makes the symptoms worse. This had been occurring for years. It is intermittent in nature. Her bowel habits are 1-3 times a day and her bowel movements are soft. She takes miralax and colace daily. Her fiber intake is minimal. Her last colonoscopy was 1/13. She does have prolapsing tissue and stool leakage daily.   Past Medical History   Diagnosis  Date   .  CHF (congestive heart failure)    .  Hypertension    .  Diabetes mellitus    .  Asthma    .  Acid reflux    .  Kidney Kerins    .  S/P cataract surgery    .  Hx of cholecystectomy    .  S/P kidney transplant    .  MI (myocardial infarction)    .  CAD (coronary artery disease)      native vessel   .  Nephrolithiasis    .  GERD (gastroesophageal reflux disease)    .  Diastolic dysfunction    .  Hyperlipidemia    .  Anemia    .  DJD (degenerative joint disease)    .  TIA (transient ischemic attack)    .  Hx of colonic polyps    .  Hx of colonoscopy    .  PUD (peptic ulcer disease)    .  Blind left eye    .  Diabetic retinopathy(362.0)    .  Stroke    .  Dialysis patient    .  Dialysis patient      hx of dialysis   .  Heart murmur    .  Hemorrhoids    .  Chills    .  Sore throat    .  Cough    .  Diarrhea    .  Abdominal pain    .  Generalized headaches    .  Fainting    .  Weakness     Past Surgical History   Procedure  Date   .  Transplantation renal    .  Cardiac catheterization    .  Abdominal hysterectomy    .  Cholecystectomy    .  Cataract extraction  bil   .  Intestinal perforation surgery  01/2009   .  Breast biopsy  2010   .  Gallstones removed     Current Outpatient Prescriptions   Medication   Sig  Dispense  Refill   .  amLODipine (NORVASC) 10 MG tablet  TAKE ONE TABLET BY MOUTH EVERY DAY  90 tablet  0   .  aspirin EC 325 MG tablet  Take 325 mg by mouth daily.     Marland Kitchen  azaTHIOprine (IMURAN) 50 MG tablet  Take 50 mg by mouth 2 (two) times daily.     .  brimonidine (ALPHAGAN) 0.2 % ophthalmic solution  Place 1 drop into both eyes 2 (two) times daily.     .  brimonidine-timolol (COMBIGAN) 0.2-0.5 % ophthalmic solution  Place 1 drop into both eyes 2 (two) times daily.     .  butalbital-acetaminophen-caffeine (FIORICET, ESGIC) 50-325-40 MG per tablet  Take 1 tablet  by mouth every 6 (six) hours as needed.     .  calcitRIOL (ROCALTROL) 0.25 MCG capsule  Take 0.25 mcg by mouth daily. Take 2 tabs daily     .  cloNIDine (CATAPRES) 0.2 MG tablet  Take 0.2 mg by mouth 2 (two) times daily.     .  furosemide (LASIX) 80 MG tablet  Take 2 tablets bid     .  hydrALAZINE (APRESOLINE) 50 MG tablet  Take 1 tablet (50 mg total) by mouth 3 (three) times daily.  90 tablet  3   .  hydrocortisone (ANUSOL-HC) 2.5 % rectal cream  Insert rectally at HS  12 g  1   .  insulin glargine (LANTUS) 100 UNIT/ML injection  Inject 65 Units into the skin daily.  30 mL  12   .  isosorbide mononitrate (IMDUR) 60 MG 24 hr tablet  TAKE ONE TABLET BY MOUTH EVERY DAY  90 tablet  3   .  K Phos Mono-Sod Phos Di & Mono (PHOSPHA 250 NEUTRAL PO)  Take by mouth 2 (two) times daily.     Marland Kitchen  KLOR-CON M20 20 MEQ tablet  TAKE ONE TABLET BY MOUTH EVERY DAY,TAKE AN EXTRA TABLET FOR FLUID WEIGHT GAIN OF 3 POUNDS OR MORE DAILY AS NEEDED  60 tablet  3   .  LANTUS 100 UNIT/ML injection  INJECT 55 UNITS INTO THE SKIN DAILY  30 mL  11   .  Magnesium Oxide 250 MG TABS  Take 1 tablet by mouth daily.     .  metoprolol (LOPRESSOR) 100 MG tablet  TAKE ONE & ONE-HALF TABLETS BY MOUTH TWICE DAILY  90 tablet  7   .  minoxidil (LONITEN) 2.5 MG tablet  TAKE ONE TABLET BY MOUTH TWICE DAILY  60 tablet  2   .  Multiple Vitamin (MULTIVITAMIN) capsule  Take 1  capsule by mouth daily.     Marland Kitchen  NEXIUM 40 MG capsule  TAKE ONE CAPSULE BY MOUTH EVERY DAY  90 each  1   .  nitroGLYCERIN (NITROSTAT) 0.4 MG SL tablet  Place 1 tablet (0.4 mg total) under the tongue every 5 (five) minutes as needed for chest pain.  25 tablet  11   .  NOVOLOG FLEXPEN 100 UNIT/ML injection  USE AS DIRECTED THREE TIMES DAILY WITH MEALS A-16-20-20  60 mL  2   .  polyethylene glycol powder (GLYCOLAX/MIRALAX) powder  TAKE 17 GM BY MOUTH DAILY  1054 g  10   .  pravastatin (PRAVACHOL) 20 MG tablet  Take 10 mg by mouth at bedtime.     .  predniSONE (DELTASONE) 10 MG tablet  TAKE ONE TABLET BY MOUTH EVERY DAY  90 tablet  3   .  tacrolimus (PROGRAF) 1 MG capsule  Take by mouth. Take 8 mg in am and 7 mg in pm     .  timolol (TIMOPTIC) 0.25 % ophthalmic solution  Place 1 drop into the right eye 2 (two) times daily.     .  traMADol (ULTRAM) 50 MG tablet  Take 1 tablet (50 mg total) by mouth every 8 (eight) hours as needed for pain.  60 tablet  2    Allergies   Allergen  Reactions   .  Cephalexin      REACTION: edema/face   .  Propoxyphene-Acetaminophen     Family History   Problem  Relation  Age of Onset   .  Kidney disease  Neg Hx    .  Arthritis  Other       parent    .  Heart disease  Other       parent    .  Hypertension  Other       parent    .  Diabetes  Other       parent    .  Diabetes  Other       grandparent    .  Thyroid disease  Other       several    .  Heart disease  Father     History    Social History   .  Marital Status:  Legally Separated     Spouse Name:  N/A     Number of Children:  4   .  Years of Education:  N/A    Occupational History   .  Disables/[employer]I since 2002     Social History Main Topics   .  Smoking status:  Never Smoker   .  Smokeless tobacco:  Never Used   .  Alcohol Use:  No   .  Drug Use:  No   .  Sexually Active:  None    Other Topics  Concern   .  None    Social History Narrative   .  None   REVIEW OF SYSTEMS -  PERTINENT POSITIVES ONLY:  Review of Systems - General ROS: negative for - chills, fatigue or fever  Hematological and Lymphatic ROS: negative for - bleeding problems or blood clots  Respiratory ROS: no cough, shortness of breath, or wheezing  Cardiovascular ROS: no chest pain or dyspnea on exertion  Gastrointestinal ROS: no abdominal pain, change in bowel habits, or black or bloody stools  EXAM:  Filed Vitals:   01/23/12 0943  BP: 125/60  Pulse: 62  Temp: 97.4 F (36.3 C)  Resp: 18    General appearance: alert and cooperative  Resp: clear to auscultation bilaterally  Cardio: regular rate and rhythm  GI: soft, non-tender; bowel sounds normal; no masses, no organomegaly   Anoscopy Findings from previous exam: posterior internal hemorrhoid prolapse with what appears to be condyloma or inflammatory polyps on it's surface, external skin tag, good sphincter tone   ASSESSMENT AND PLAN:  I believe that her fecal leakage is due to her prolapsed hemorrhoid. I recommended that she have this removed. I discussed the risks and benefits of this. This include bleeding, pain, continued leakage and need for additional procedures. I believe that she understands these risks and has agreed to proceed with surgery.  Vanita Panda, MD  Colon and Rectal Surgery / General Surgery  [facility]

## 2012-01-24 ENCOUNTER — Encounter (HOSPITAL_BASED_OUTPATIENT_CLINIC_OR_DEPARTMENT_OTHER): Payer: Self-pay | Admitting: General Surgery

## 2012-01-24 NOTE — Anesthesia Postprocedure Evaluation (Signed)
Anesthesia Post Note  Patient: Monica Lee  Procedure(s) Performed: Procedure(s) (LRB): HEMORRHOIDECTOMY PROLAPSED (N/A)  Anesthesia type: MAC  Patient location: PACU  Post pain: Pain level controlled  Post assessment: Post-op Vital signs reviewed  Last Vitals:  Filed Vitals:   01/23/12 1454  BP: 152/72  Pulse: 64  Temp: 36.1 C  Resp: 18    Post vital signs: Reviewed  Level of consciousness: sedated  Complications: No apparent anesthesia complications

## 2012-01-30 ENCOUNTER — Other Ambulatory Visit: Payer: Self-pay | Admitting: Internal Medicine

## 2012-01-30 DIAGNOSIS — Z940 Kidney transplant status: Secondary | ICD-10-CM | POA: Diagnosis not present

## 2012-02-07 ENCOUNTER — Encounter (INDEPENDENT_AMBULATORY_CARE_PROVIDER_SITE_OTHER): Payer: Self-pay | Admitting: General Surgery

## 2012-02-07 ENCOUNTER — Ambulatory Visit (INDEPENDENT_AMBULATORY_CARE_PROVIDER_SITE_OTHER): Payer: Medicare Other | Admitting: General Surgery

## 2012-02-07 VITALS — BP 142/82 | HR 74 | Temp 96.4°F | Resp 16 | Ht 65.0 in | Wt 191.2 lb

## 2012-02-07 DIAGNOSIS — K623 Rectal prolapse: Secondary | ICD-10-CM

## 2012-02-07 NOTE — Patient Instructions (Signed)
Return to the office as needed 

## 2012-02-07 NOTE — Progress Notes (Signed)
Monica Lee is a 62 y.o. female who is status post a mucopexy on 1/9.  She is doing well.  Her pain is almost gone.  Her bleeding is gone and she is not having any more leakage.  Objective: Filed Vitals:   02/07/12 1440  BP: 142/82  Pulse: 74  Temp: 96.4 F (35.8 C)  Resp: 16    General appearance: alert and cooperative  Incision: healing well   Assessment: s/p  Patient Active Problem List  Diagnosis  . DIABETES MELLITUS, TYPE II  . DIABETIC  RETINOPATHY  . HYPERLIPIDEMIA  . ANEMIA-NOS  . INCREASED INTRAOCULAR PRESSURE  . HYPERTENSION  . MYOCARDIAL INFARCTION, ACUTE, SUBENDOCARDIAL  . MYOCARDIAL INFARCTION, HX OF  . CORONARY ARTERY DISEASE  . CAD, NATIVE VESSEL  . Chronic diastolic heart failure  . ASTHMA  . GERD  . PEPTIC ULCER DISEASE  . Gastroparesis  . LOW BACK PAIN, CHRONIC  . Edema  . TRANSIENT ISCHEMIC ATTACK, HX OF  . COLONIC POLYPS, HX OF  . NEPHROLITHIASIS, HX OF  . KIDNEY TRANSPLANTATION  . Multinodular goiter  . Hyperthyroidism  . Preventative health care  . Hemorrhoids  . Asthma exacerbation  . Cellulitis, abdominal wall    Plan: Doing well.  Continue high fiber diet.  RTO PRN    .Vanita Panda, MD [facility], [place] [phone]   02/07/2012 2:55 PM

## 2012-02-10 DIAGNOSIS — L608 Other nail disorders: Secondary | ICD-10-CM | POA: Diagnosis not present

## 2012-02-10 DIAGNOSIS — I739 Peripheral vascular disease, unspecified: Secondary | ICD-10-CM | POA: Diagnosis not present

## 2012-02-10 DIAGNOSIS — E1059 Type 1 diabetes mellitus with other circulatory complications: Secondary | ICD-10-CM | POA: Diagnosis not present

## 2012-02-24 DIAGNOSIS — M659 Synovitis and tenosynovitis, unspecified: Secondary | ICD-10-CM | POA: Diagnosis not present

## 2012-02-24 DIAGNOSIS — IMO0002 Reserved for concepts with insufficient information to code with codable children: Secondary | ICD-10-CM | POA: Diagnosis not present

## 2012-02-24 DIAGNOSIS — G5750 Tarsal tunnel syndrome, unspecified lower limb: Secondary | ICD-10-CM | POA: Diagnosis not present

## 2012-02-24 DIAGNOSIS — M65979 Unspecified synovitis and tenosynovitis, unspecified ankle and foot: Secondary | ICD-10-CM | POA: Diagnosis not present

## 2012-02-24 DIAGNOSIS — Z940 Kidney transplant status: Secondary | ICD-10-CM | POA: Diagnosis not present

## 2012-02-24 DIAGNOSIS — E213 Hyperparathyroidism, unspecified: Secondary | ICD-10-CM | POA: Diagnosis not present

## 2012-03-19 ENCOUNTER — Encounter: Payer: Self-pay | Admitting: Cardiology

## 2012-03-19 DIAGNOSIS — Z940 Kidney transplant status: Secondary | ICD-10-CM | POA: Diagnosis not present

## 2012-03-31 ENCOUNTER — Encounter: Payer: Self-pay | Admitting: Cardiology

## 2012-03-31 ENCOUNTER — Other Ambulatory Visit: Payer: Self-pay | Admitting: Internal Medicine

## 2012-03-31 DIAGNOSIS — Z940 Kidney transplant status: Secondary | ICD-10-CM | POA: Diagnosis not present

## 2012-03-31 DIAGNOSIS — E213 Hyperparathyroidism, unspecified: Secondary | ICD-10-CM | POA: Diagnosis not present

## 2012-04-09 ENCOUNTER — Ambulatory Visit (INDEPENDENT_AMBULATORY_CARE_PROVIDER_SITE_OTHER): Payer: Medicare Other | Admitting: Internal Medicine

## 2012-04-09 ENCOUNTER — Encounter: Payer: Self-pay | Admitting: Internal Medicine

## 2012-04-09 ENCOUNTER — Other Ambulatory Visit (INDEPENDENT_AMBULATORY_CARE_PROVIDER_SITE_OTHER): Payer: Medicare Other

## 2012-04-09 VITALS — BP 112/82 | HR 72 | Temp 98.0°F | Ht 65.0 in | Wt 197.4 lb

## 2012-04-09 DIAGNOSIS — I10 Essential (primary) hypertension: Secondary | ICD-10-CM | POA: Diagnosis not present

## 2012-04-09 DIAGNOSIS — E119 Type 2 diabetes mellitus without complications: Secondary | ICD-10-CM

## 2012-04-09 DIAGNOSIS — M25519 Pain in unspecified shoulder: Secondary | ICD-10-CM

## 2012-04-09 DIAGNOSIS — M25511 Pain in right shoulder: Secondary | ICD-10-CM

## 2012-04-09 DIAGNOSIS — Z940 Kidney transplant status: Secondary | ICD-10-CM

## 2012-04-09 LAB — HEPATIC FUNCTION PANEL
ALT: 16 U/L (ref 0–35)
Alkaline Phosphatase: 42 U/L (ref 39–117)
Bilirubin, Direct: 0 mg/dL (ref 0.0–0.3)
Total Bilirubin: 0.4 mg/dL (ref 0.3–1.2)

## 2012-04-09 LAB — BASIC METABOLIC PANEL
CO2: 30 mEq/L (ref 19–32)
Calcium: 9 mg/dL (ref 8.4–10.5)
Chloride: 96 mEq/L (ref 96–112)
Creatinine, Ser: 1.3 mg/dL — ABNORMAL HIGH (ref 0.4–1.2)
Sodium: 136 mEq/L (ref 135–145)

## 2012-04-09 LAB — LIPID PANEL
Cholesterol: 150 mg/dL (ref 0–200)
Total CHOL/HDL Ratio: 4
VLDL: 65.2 mg/dL — ABNORMAL HIGH (ref 0.0–40.0)

## 2012-04-09 MED ORDER — CINACALCET HCL 30 MG PO TABS: 30.0000 mg | ORAL_TABLET | Freq: Every day | ORAL | Status: DC

## 2012-04-09 MED ORDER — POTASSIUM CHLORIDE ER 10 MEQ PO TBCR: 10.0000 meq | EXTENDED_RELEASE_TABLET | Freq: Two times a day (BID) | ORAL | Status: DC

## 2012-04-09 NOTE — Patient Instructions (Addendum)
Please continue all other medications as before, and refills have been done if requested. Please go to the LAB in the Basement (turn left off the elevator) for the tests to be done today You will be contacted by phone if any changes need to be made immediately.  Otherwise, you will receive a letter about your results with an explanation, but please check with MyChart first. Please remember to sign up for My Chart if you have not done so, as this will be important to you in the future with finding out test results, communicating by private email, and scheduling acute appointments online when needed. You will be contacted regarding the referral for: endocrincology for the sugar - to see [facility] now please

## 2012-04-09 NOTE — Assessment & Plan Note (Signed)
For bmet today,  to f/u any worsening symptoms or concerns

## 2012-04-09 NOTE — Assessment & Plan Note (Signed)
Uncontrolled, needs better control espeically in the setting of renal transplant, to cont same meds, refer endo

## 2012-04-09 NOTE — Assessment & Plan Note (Signed)
stable overall by history and exam, recent data reviewed with pt, and pt to continue medical treatment as before,  to f/u any worsening symptoms or concerns BP Readings from Last 3 Encounters:  04/09/12 112/82  02/07/12 142/82  01/23/12 152/72

## 2012-04-09 NOTE — Progress Notes (Signed)
Subjective:    Patient ID: Monica Lee, female    DOB: [birth year], 62 y.o.   MRN: [id number]  HPI  Here to f/u; overall doing ok,  Pt denies chest pain, increased sob or doe, wheezing, orthopnea, PND, increased LE swelling, palpitations, dizziness or syncope.  Pt denies polydipsia, polyuria, or low sugar symptoms such as weakness or confusion improved with po intake.  Pt denies new neurological symptoms such as new headache, or facial or extremity weakness or numbness.   Pt states overall good compliance with meds, has been trying to follow lower cholesterol, diabetic diet, with wt overall stable,  but little exercise however, and  Recent BS > 400 and cr 1.5 per labs per [facility]. Followed by Dr Deterding, had recent phos med change. This office apparently not able to contact pt about endo referral as requested nov for DM, was seen for thyroid only recently.  Also with tender/pain to right AC joint, worse with shoulder movement Past Medical History  Diagnosis Date  . CHF (congestive heart failure)   . Diabetes mellitus   . Acid reflux   . S/P kidney transplant   . CAD (coronary artery disease)     native vessel  . GERD (gastroesophageal reflux disease)   . Diastolic dysfunction   . Hyperlipidemia   . DJD (degenerative joint disease)   . TIA (transient ischemic attack)   . Hx of colonic polyps   . PUD (peptic ulcer disease)   . Diabetic retinopathy(362.0)   . Heart murmur   . Hemorrhoids   . Generalized headaches   . Hypertension   . Asthma   . MI (myocardial infarction) 2011  . Kidney Rosencrans   . Nephrolithiasis   . Mental disorder   . Stroke 2008    no deficits  . Anemia     Hx-not current   Past Surgical History  Procedure Laterality Date  . Transplantation renal  05/2006  . Cardiac catheterization  2011  . Abdominal hysterectomy    . Cataract extraction  bil  . Intestinal perforation surgery  01/2009  . Breast biopsy  2010  . Gallstones removed    .  Cholecystectomy  2008  . Colon surgery  2013    polyps removed  . Hemorrhoid surgery  01/23/2012    Procedure: HEMORRHOIDECTOMY PROLAPSED;  Surgeon: Romie Levee, MD;  [facility];  Service: General;  Laterality: N/A;    reports that she has never smoked. She has never used smokeless tobacco. She reports that she does not drink alcohol or use illicit drugs. family history includes Arthritis in her other; Diabetes in her others; Heart disease in her father and other; Hypertension in her other; and Thyroid disease in her other.  There is no history of Kidney disease. Allergies  Allergen Reactions  . Cephalexin     REACTION: edema/face  . Propoxyphene-Acetaminophen    Current Outpatient Prescriptions on File Prior to Visit  Medication Sig Dispense Refill  . amLODipine (NORVASC) 10 MG tablet Take 10 mg by mouth daily.      Marland Kitchen aspirin EC 325 MG tablet Take 325 mg by mouth daily.        Marland Kitchen azaTHIOprine (IMURAN) 50 MG tablet Take 50 mg by mouth 2 (two) times daily.        . brimonidine (ALPHAGAN) 0.2 % ophthalmic solution Place 1 drop into both eyes 2 (two) times daily.        . brimonidine-timolol (COMBIGAN) 0.2-0.5 % ophthalmic  solution Place 1 drop into both eyes 2 (two) times daily.        . calcitRIOL (ROCALTROL) 0.25 MCG capsule Take 0.25 mcg by mouth 3 (three) times daily. Take 2 tabs daily      . clindamycin (CLEOCIN) 150 MG capsule Take 3 capsules (450 mg total) by mouth 3 (three) times daily.  90 capsule  0  . cloNIDine (CATAPRES) 0.2 MG tablet Take 0.2 mg by mouth 2 (two) times daily.       Marland Kitchen docusate sodium (COLACE) 100 MG capsule Take 1 capsule (100 mg total) by mouth 2 (two) times daily.  60 capsule  0  . esomeprazole (NEXIUM) 40 MG capsule Take 40 mg by mouth daily before breakfast.      . furosemide (LASIX) 80 MG tablet Take 80 mg by mouth 2 (two) times daily.       . insulin aspart (NOVOLOG) 100 UNIT/ML injection Inject 16-20 Units into the skin 3 (three)  times daily before meals. 16 units with breakfast and 20 units with lunch and dinner.      . insulin glargine (LANTUS) 100 UNIT/ML injection Inject 65 Units into the skin daily.  30 mL  12  . isosorbide mononitrate (IMDUR) 60 MG 24 hr tablet Take 60 mg by mouth daily.      . Magnesium Oxide 250 MG TABS Take 1 tablet by mouth daily.       . metoprolol (LOPRESSOR) 100 MG tablet Take 150 mg by mouth 2 (two) times daily.      . minoxidil (LONITEN) 2.5 MG tablet Take 2.5 mg by mouth 2 (two) times daily.      . Multiple Vitamin (MULTIVITAMIN) capsule Take 1 capsule by mouth daily.        . nitroGLYCERIN (NITROSTAT) 0.4 MG SL tablet Place 1 tablet (0.4 mg total) under the tongue every 5 (five) minutes as needed for chest pain.  25 tablet  11  . oxyCODONE (OXY IR/ROXICODONE) 5 MG immediate release tablet Take 1-2 tablets (5-10 mg total) by mouth every 4 (four) hours as needed for pain.  50 tablet  0  . polyethylene glycol (MIRALAX / GLYCOLAX) packet Take 17 g by mouth daily.      . pravastatin (PRAVACHOL) 20 MG tablet Take 10 mg by mouth at bedtime.        . predniSONE (DELTASONE) 10 MG tablet Take 4 tablets (40 mg total) by mouth daily. Take 60mg  by mouth daily for 3 days, then 50 mg by mouth daily for 2 days, then 40mg  by mouth daily for 2 days, then 30mg  daily for 2 days,  Then 20mg  daily for 2 days,  then 10mg  daily for 2 days  48 tablet  0  . predniSONE (DELTASONE) 10 MG tablet TAKE ONE TABLET BY MOUTH EVERY DAY  90 tablet  2  . tacrolimus (PROGRAF) 1 MG capsule Take by mouth. Take 8 mg in am and 7 mg in pm      . timolol (TIMOPTIC) 0.25 % ophthalmic solution Place 1 drop into the right eye 2 (two) times daily.       . traMADol (ULTRAM) 50 MG tablet Take 1 tablet (50 mg total) by mouth every 8 (eight) hours as needed for pain.  60 tablet  2   No current facility-administered medications on file prior to visit.   Review of Systems  Constitutional: Negative for unexpected weight change, or unusual  diaphoresis  HENT: Negative for tinnitus.   Eyes: Negative  for photophobia and visual disturbance.  Respiratory: Negative for choking and stridor.   Gastrointestinal: Negative for vomiting and blood in stool.  Genitourinary: Negative for hematuria and decreased urine volume.  Musculoskeletal: Negative for acute joint swelling Skin: Negative for color change and wound.  Neurological: Negative for tremors and numbness other than noted  Psychiatric/Behavioral: Negative for decreased concentration or  hyperactivity.       Objective:   Physical Exam BP 112/82  Pulse 72  Temp(Src) 98 F (36.7 C) (Oral)  Ht 5\' 5"  (1.651 m)  Wt 197 lb 6 oz (89.529 kg)  BMI 32.85 kg/m2  SpO2 93% VS noted,  Constitutional: Pt appears well-developed and well-nourished.  HENT: Head: NCAT.  Right Ear: External ear normal.  Left Ear: External ear normal.  Eyes: Conjunctivae and EOM are normal. Pupils are equal, round, and reactive to light.  Neck: Normal range of motion. Neck supple.  Cardiovascular: Normal rate and regular rhythm.   Pulmonary/Chest: Effort normal and breath sounds normal.  Neurological: Pt is alert. Not confused motor intact Skin: Skin is warm. No erythema.  Psychiatric: Pt behavior is normal. Thought content normal.  Right AC joint mild tender, + hawkins test    Assessment & Plan:

## 2012-04-09 NOTE — Assessment & Plan Note (Signed)
C/w ac joint DJD flare, d/w pt, mild, tylenol prn

## 2012-04-13 ENCOUNTER — Ambulatory Visit: Payer: Medicare Other | Admitting: Endocrinology

## 2012-04-15 ENCOUNTER — Encounter: Payer: Self-pay | Admitting: Cardiology

## 2012-04-15 DIAGNOSIS — N2581 Secondary hyperparathyroidism of renal origin: Secondary | ICD-10-CM | POA: Diagnosis not present

## 2012-04-15 DIAGNOSIS — E119 Type 2 diabetes mellitus without complications: Secondary | ICD-10-CM | POA: Diagnosis not present

## 2012-04-15 DIAGNOSIS — I10 Essential (primary) hypertension: Secondary | ICD-10-CM | POA: Diagnosis not present

## 2012-04-15 DIAGNOSIS — Z940 Kidney transplant status: Secondary | ICD-10-CM | POA: Diagnosis not present

## 2012-04-15 DIAGNOSIS — E213 Hyperparathyroidism, unspecified: Secondary | ICD-10-CM | POA: Diagnosis not present

## 2012-04-27 ENCOUNTER — Other Ambulatory Visit: Payer: Self-pay | Admitting: Internal Medicine

## 2012-04-28 ENCOUNTER — Ambulatory Visit (INDEPENDENT_AMBULATORY_CARE_PROVIDER_SITE_OTHER): Payer: Medicare Other | Admitting: Internal Medicine

## 2012-04-28 ENCOUNTER — Encounter: Payer: Self-pay | Admitting: Internal Medicine

## 2012-04-28 VITALS — BP 134/64 | HR 70 | Temp 98.0°F | Resp 10 | Ht 65.0 in | Wt 196.0 lb

## 2012-04-28 DIAGNOSIS — E1159 Type 2 diabetes mellitus with other circulatory complications: Secondary | ICD-10-CM | POA: Diagnosis not present

## 2012-04-28 MED ORDER — INSULIN ASPART 100 UNIT/ML ~~LOC~~ SOLN: 16.0000 [IU] | Freq: Three times a day (TID) | SUBCUTANEOUS | Status: DC

## 2012-04-28 MED ORDER — INSULIN GLARGINE 100 UNIT/ML ~~LOC~~ SOLN: 60.0000 [IU] | Freq: Every day | SUBCUTANEOUS | Status: DC

## 2012-04-28 NOTE — Patient Instructions (Addendum)
Please return in one month with your sugar log. Decrease the Lantus dose to 60 units at night. Take 15 units of NovoLog with breakfast and lunch, and 18 units with dinner. At the following sliding scale: 150-160: +1 units 161-170: +2 units 171-180: +3 units 181-190: +4 units 191-200: +5 units >200: +6 units   PATIENT INSTRUCTIONS FOR TYPE 2 DIABETES:  **Please join MyChart!** - see attached instructions about how to join   DIET AND EXERCISE Diet and exercise is an important part of diabetic treatment.  We recommended aerobic exercise in the form of brisk walking (working between 40-60% of maximal aerobic capacity, similar to brisk walking) for 150 minutes per week (such as 30 minutes five days per week) along with 3 times per week performing 'resistance' training (using various gauge rubber tubes with handles) 5-10 exercises involving the major muscle groups (upper body, lower body and core) performing 10-15 repetitions (or near fatigue) each exercise. Start at half the above goal but build slowly to reach the above goals. If limited by weight, joint pain, or disability, we recommend daily walking in a swimming pool with water up to waist to reduce pressure from joints while allow for adequate exercise.    BLOOD GLUCOSES Monitoring your blood glucoses is important for continued management of your diabetes. Please check your blood glucoses 2-4 times a day: fasting, before meals and at bedtime (you can rotate these measurements - e.g. one day check before the 3 meals, the next day check before 2 of the meals and before bedtime, etc.   HYPOGLYCEMIA (low blood sugar) Hypoglycemia is usually a reaction to not eating, exercising, or taking too much insulin/ other diabetes drugs.  Symptoms include tremors, sweating, hunger, confusion, headache, etc. Treat IMMEDIATELY with 15 grams of Carbs:   4 glucose tablets    cup regular juice/soda   2 tablespoons raisins   4 teaspoons sugar   1  tablespoon honey Recheck blood glucose in 15 mins and repeat above if still symptomatic/blood glucose <100. Please contact our office at [phone] if you have questions about how to next handle your insulin.  RECOMMENDATIONS TO REDUCE YOUR RISK OF DIABETIC COMPLICATIONS: * Take your prescribed MEDICATION(S). * Follow a DIABETIC diet: Complex carbs, fiber rich foods, heart healthy fish twice weekly, (monounsaturated and polyunsaturated) fats * AVOID saturated/trans fats, high fat foods, >2,300 mg salt per day. * EXERCISE at least 5 times a week for 30 minutes or preferably daily.  * DO NOT SMOKE OR DRINK more than 1 drink a day. * Check your FEET every day. Do not wear tightfitting shoes. Contact us if you develop an ulcer * See your EYE doctor once a year or more if needed * Get a FLU shot once a year * Get a PNEUMONIA vaccine once before and once after age 87 years  GOALS:  * Your Hemoglobin A1c of <7%  * Your Systolic BP should be 140 or lower  * Your Diastolic BP should be 80 or lower  * Your HDL (Good Cholesterol) should be 40 or higher  * Your LDL (Bad Cholesterol) should be 100 or lower  * Your Triglycerides should be 150 or lower  * Your Urine microalbumin (kidney function) should be <30 * Your Body Mass Index should be 25 or lower   We will be glad to help you achieve these goals. Our telephone number is: [phone].

## 2012-04-28 NOTE — Progress Notes (Signed)
Patient ID: Monica Lee, female   DOB: 07-Jan-1951, 62 y.o.   MRN: [id number]  HPI: Monica Lee is a 61 y.o.-year-old female, referred by her PCP, Dr.John, for management of DM2, insulin-dependent, uncontrolled, with multiple complications (diabetic retinopathy, history of cadaveric kidney transplant in 05/2006 - now with CKD stage III, gastroparesis, CAD-history of MI in 2011, CHF- diastolic dysfunction, history of TIA and stroke in 2008).  Patient has been diagnosed with diabetes in 1980 when pregnant with her daughter; she has been started on insulin in the 1980s. Last hemoglobin A1c was: Lab Results  Component Value Date   HGBA1C 9.1* 04/09/2012  Prev. 8.6%.  Pt is on a regimen of: - Lantus 65 units qhs - Novolog 16-20-20 units (tid) ac - if she does not eat carbs in am or if fasting sugar <110, she does not take the morning insulin  Pt checks her sugars 3-4x a day and they are: - am: 59-144 - pre-lunch: 55-174 - 77-197, 1 value at 219 - bedtime: 81-144  She does have low CBGs: lowest sugar was 55; she has hypoglycemia awareness at 80. She had lows 4x in 2 weeks: am and prelunch. Highest sugar was 219 in last 2 weeks.  Pt's meals are: - Breakfast: oatmeal, eggs or sausage - only takes the insulin with sausage and eggs, not with oatmeal - Lunch: sandwich - Dinner: meat, vegetables, and a starch - Snacks: 3 a day cookies, fruit, chips  Pt does not have chronic kidney disease, last BUN/creatinine was:  Lab Results  Component Value Date   BUN 26* 04/09/2012   CREATININE 1.3* 04/09/2012   Last set of lipids: Lab Results  Component Value Date   CHOL 150 04/09/2012   HDL 39.80 04/09/2012   LDLCALC 68 09/04/2010   LDLDIRECT 70.6 04/09/2012   TRIG 326.0* 04/09/2012   CHOLHDL 4 04/09/2012   Pt's last eye exam was in 12/2011. She tells me there were no problems then but she had surgeries in the left eye for bleeding (?). Denies numbness and tingling in her legs. She has a history of  renal transplant in 2008, and she is taking Prograf, Imuran, prednisone 10 mg daily. Her most recent creatinine was 1.5. She also sees podiatry.  PMH: I reviewed her chart and she also has a history of hypertension, hyperlipidemia, GERD, DJD, headaches, history of nephrolithiasis, history of anemia, cataracts status post surgery, history of hyperthyroidism.   Pt has FH of DM in aunts, mother (who died when pt was 3 y/o), 2 children.  ROS: Constitutional: + weight gain, decreased appetite, no fatigue, poor sleep, hot flashes Eyes: + blurry vision, no xerophthalmia ENT: no sore throat, no nodules palpated in throat, no dysphagia/odynophagia, no hoarseness Cardiovascular: no CP/SOB/palpitations/leg swelling Respiratory: + cough/+ SOB/ + wheezing Gastrointestinal: no N/V/D/C Musculoskeletal: + muscle/+ joint aches/+ bone pain Skin: has a rash, excessive hair growth Neurological: no tremors/numbness/tingling/dizziness Psychiatric: no depression/anxiety  Past Surgical History  Procedure Laterality Date  . Transplantation renal  05/2006  . Cardiac catheterization  2011  . Abdominal hysterectomy    . Cataract extraction  bil  . Intestinal perforation surgery  01/2009  . Breast biopsy  2010  . Gallstones removed    . Cholecystectomy  2008  . Colon surgery  2013    polyps removed  . Hemorrhoid surgery  01/23/2012    Procedure: HEMORRHOIDECTOMY PROLAPSED;  Surgeon: Romie Levee, MD;  Location: [facility];  Service: General;  Laterality: N/A;  History   Social History  . Marital Status: Legally Separated    Number of Children: 4   Occupational History  . Disables/SSI since 2002    Social History Main Topics  . Smoking status: Never Smoker   . Smokeless tobacco: Never Used  . Alcohol Use: No  . Drug Use: No  . Sexually Active: Not on file   Medication Sig  . amLODipine (NORVASC) 10 MG tablet Take 10 mg by mouth daily.  Marland Kitchen aspirin EC 325 MG tablet Take 325 mg by  mouth daily.    Marland Kitchen azaTHIOprine (IMURAN) 50 MG tablet Take 50 mg by mouth 2 (two) times daily.    . brimonidine (ALPHAGAN) 0.2 % ophthalmic solution Place 1 drop into both eyes 2 (two) times daily.    . brimonidine-timolol (COMBIGAN) 0.2-0.5 % ophthalmic solution Place 1 drop into both eyes 2 (two) times daily.    . calcitRIOL (ROCALTROL) 0.25 MCG capsule Take 0.25 mcg by mouth 3 (three) times daily. Take 2 tabs daily  . cinacalcet (SENSIPAR) 30 MG tablet Take 1 tablet (30 mg total) by mouth daily.  . clindamycin (CLEOCIN) 150 MG capsule Take 3 capsules (450 mg total) by mouth 3 (three) times daily.  . cloNIDine (CATAPRES) 0.2 MG tablet Take 0.2 mg by mouth 2 (two) times daily.   Marland Kitchen docusate sodium (COLACE) 100 MG capsule Take 1 capsule (100 mg total) by mouth 2 (two) times daily.  . furosemide (LASIX) 80 MG tablet Take 80 mg by mouth 2 (two) times daily.   . isosorbide mononitrate (IMDUR) 60 MG 24 hr tablet Take 60 mg by mouth daily.  . Magnesium Oxide 250 MG TABS Take 1 tablet by mouth daily.   . metoprolol (LOPRESSOR) 100 MG tablet Take 150 mg by mouth 2 (two) times daily.  . minoxidil (LONITEN) 2.5 MG tablet Take 2.5 mg by mouth 2 (two) times daily.  . Multiple Vitamin (MULTIVITAMIN) capsule Take 1 capsule by mouth daily.    Marland Kitchen NEXIUM 40 MG capsule TAKE ONE CAPSULE BY MOUTH EVERY DAY  . nitroGLYCERIN (NITROSTAT) 0.4 MG SL tablet Place 1 tablet (0.4 mg total) under the tongue every 5 (five) minutes as needed for chest pain.  Marland Kitchen oxyCODONE (OXY IR/ROXICODONE) 5 MG immediate release tablet Take 1-2 tablets (5-10 mg total) by mouth every 4 (four) hours as needed for pain.  . polyethylene glycol (MIRALAX / GLYCOLAX) packet Take 17 g by mouth daily.  . potassium chloride (KLOR-CON 10) 10 MEQ tablet Take 1 tablet (10 mEq total) by mouth 2 (two) times daily.  . pravastatin (PRAVACHOL) 20 MG tablet Take 10 mg by mouth at bedtime.    . predniSONE (DELTASONE) 10 MG tablet TAKE ONE TABLET BY MOUTH EVERY DAY   . tacrolimus (PROGRAF) 1 MG capsule Take by mouth. Take 8 mg in am and 7 mg in pm  . timolol (TIMOPTIC) 0.25 % ophthalmic solution Place 1 drop into the right eye 2 (two) times daily.   . traMADol (ULTRAM) 50 MG tablet Take 1 tablet (50 mg total) by mouth every 8 (eight) hours as needed for pain.   No current facility-administered medications on file prior to visit.   Allergies  Allergen Reactions  . Cephalexin     REACTION: edema/face  . Propoxyphene-Acetaminophen    Family History  Problem Relation Age of Onset  . Kidney disease Neg Hx   . Arthritis Other     parent  . Heart disease Other     parent  .  Hypertension Other     parent  . Diabetes Other     parent  . Diabetes Other     grandparent  . Thyroid disease Other     several  . Heart disease Father    PE: BP 134/64  Pulse 70  Temp(Src) 98 F (36.7 C) (Oral)  Resp 10  Ht 5\' 5"  (1.651 m)  Wt 196 lb (88.905 kg)  BMI 32.62 kg/m2  SpO2 97% Wt Readings from Last 3 Encounters:  04/28/12 196 lb (88.905 kg)  04/09/12 197 lb 6 oz (89.529 kg)  02/07/12 191 lb 3.2 oz (86.728 kg)   Constitutional: overweight, in NAD Eyes: PERRLA, EOMI, no exophthalmos ENT: moist mucous membranes, no thyromegaly, no cervical lymphadenopathy Cardiovascular: RRR, No MRG Respiratory: CTA B Gastrointestinal: abdomen soft, NT, ND, BS+ Musculoskeletal: no deformities, strength intact in all 4 Skin: moist, warm, no rashes Neurological: no tremor with outstretched hands, DTR normal in all 4  ASSESSMENT: 1. 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 Darrick Penna - gastroparesis - CAD-history of MI in 2011, last cardiac cath was in 2011 - CHF-diastolic dysfunction, sees Dr. Daleen Squibb - history of TIA and stroke in 2008  PLAN:  1. Patient with very long-standing diabetes, with poor control on a basal bolus regimen. Her  basal insulin total daily dose is larger than her bolus total daily dose (65 units Lantus versus approximately 40-56 units of NovoLog daily). We discussed about the fact that a physiologic regimen would have these daily doses more balanced. The problem with getting more Lantus and NovoLog is the risk of low blood sugars between meals and during the night (and, indeed, she is experiencing lows, in the 50s to 75).  - to decrease her chances to experience lows, I will decrease her dose of Lantus from 65 to 60 units at night.  - the patient is taking her breakfast NovoLog only if her pre-meal sugar is higher than 110, which is not a good practice, so I gave her a more consistent NovoLog at mealtime dosing and added a sliding scale as follows: Take 15 units of NovoLog with breakfast and lunch, and 18 units with dinner. Add the following sliding scale: 150-160: +1 units 161-170: +2 units 171-180: +3 units 181-190: +4 units 191-200: +5 units >200: +6 units The doses of NovoLog are little bit smaller, in an attempt to reduce her lows. - we also discussed about lag times (time between Novolog dosing and starting of the meal) and how to adjust these based on her sugars before a meal. She was intuitively doing this correctly (shorten the time between Novolog injection and meal if sugars were lower). - given sugar log and advised how to fill it and to bring it at next appt- she should continue to check her sugars 3-4x a day before meals and at bedtime, rotating check times - given foot care handout and explained the principles - given instructions for hypoglycemia management "15-15 rule" - I will see her back in a month with her sugar log, however I advised her to call me in one to 2 weeks to let me know about her sugars - Advise her to join my chart - no labs for today

## 2012-04-30 ENCOUNTER — Other Ambulatory Visit: Payer: Self-pay | Admitting: Internal Medicine

## 2012-05-04 ENCOUNTER — Emergency Department (HOSPITAL_COMMUNITY)
Admission: EM | Admit: 2012-05-04 | Discharge: 2012-05-04 | Disposition: A | Discharge status: Home / Self Care | Payer: Medicare Other | Attending: Emergency Medicine | Admitting: Emergency Medicine

## 2012-05-04 ENCOUNTER — Encounter (HOSPITAL_COMMUNITY): Payer: Self-pay | Admitting: Emergency Medicine

## 2012-05-04 ENCOUNTER — Emergency Department (HOSPITAL_COMMUNITY): Payer: Medicare Other

## 2012-05-04 DIAGNOSIS — R059 Cough, unspecified: Secondary | ICD-10-CM | POA: Insufficient documentation

## 2012-05-04 DIAGNOSIS — Z794 Long term (current) use of insulin: Secondary | ICD-10-CM | POA: Insufficient documentation

## 2012-05-04 DIAGNOSIS — Z8659 Personal history of other mental and behavioral disorders: Secondary | ICD-10-CM | POA: Diagnosis not present

## 2012-05-04 DIAGNOSIS — J45909 Unspecified asthma, uncomplicated: Secondary | ICD-10-CM | POA: Insufficient documentation

## 2012-05-04 DIAGNOSIS — Z8679 Personal history of other diseases of the circulatory system: Secondary | ICD-10-CM | POA: Diagnosis not present

## 2012-05-04 DIAGNOSIS — Z87448 Personal history of other diseases of urinary system: Secondary | ICD-10-CM | POA: Insufficient documentation

## 2012-05-04 DIAGNOSIS — I10 Essential (primary) hypertension: Secondary | ICD-10-CM | POA: Diagnosis not present

## 2012-05-04 DIAGNOSIS — K219 Gastro-esophageal reflux disease without esophagitis: Secondary | ICD-10-CM | POA: Insufficient documentation

## 2012-05-04 DIAGNOSIS — Z7982 Long term (current) use of aspirin: Secondary | ICD-10-CM | POA: Diagnosis not present

## 2012-05-04 DIAGNOSIS — R05 Cough: Secondary | ICD-10-CM | POA: Insufficient documentation

## 2012-05-04 DIAGNOSIS — Z87442 Personal history of urinary calculi: Secondary | ICD-10-CM | POA: Insufficient documentation

## 2012-05-04 DIAGNOSIS — E11319 Type 2 diabetes mellitus with unspecified diabetic retinopathy without macular edema: Secondary | ICD-10-CM | POA: Insufficient documentation

## 2012-05-04 DIAGNOSIS — I2510 Atherosclerotic heart disease of native coronary artery without angina pectoris: Secondary | ICD-10-CM | POA: Diagnosis not present

## 2012-05-04 DIAGNOSIS — Z8711 Personal history of peptic ulcer disease: Secondary | ICD-10-CM | POA: Insufficient documentation

## 2012-05-04 DIAGNOSIS — E785 Hyperlipidemia, unspecified: Secondary | ICD-10-CM | POA: Diagnosis not present

## 2012-05-04 DIAGNOSIS — I509 Heart failure, unspecified: Secondary | ICD-10-CM | POA: Insufficient documentation

## 2012-05-04 DIAGNOSIS — Z8673 Personal history of transient ischemic attack (TIA), and cerebral infarction without residual deficits: Secondary | ICD-10-CM | POA: Diagnosis not present

## 2012-05-04 DIAGNOSIS — E1139 Type 2 diabetes mellitus with other diabetic ophthalmic complication: Secondary | ICD-10-CM | POA: Diagnosis not present

## 2012-05-04 DIAGNOSIS — Z860100 Personal history of colon polyps, unspecified: Secondary | ICD-10-CM | POA: Insufficient documentation

## 2012-05-04 DIAGNOSIS — I252 Old myocardial infarction: Secondary | ICD-10-CM | POA: Insufficient documentation

## 2012-05-04 DIAGNOSIS — Z940 Kidney transplant status: Secondary | ICD-10-CM | POA: Insufficient documentation

## 2012-05-04 DIAGNOSIS — Z862 Personal history of diseases of the blood and blood-forming organs and certain disorders involving the immune mechanism: Secondary | ICD-10-CM | POA: Diagnosis not present

## 2012-05-04 DIAGNOSIS — J209 Acute bronchitis, unspecified: Secondary | ICD-10-CM | POA: Insufficient documentation

## 2012-05-04 DIAGNOSIS — R0602 Shortness of breath: Secondary | ICD-10-CM | POA: Diagnosis not present

## 2012-05-04 DIAGNOSIS — Z8739 Personal history of other diseases of the musculoskeletal system and connective tissue: Secondary | ICD-10-CM | POA: Diagnosis not present

## 2012-05-04 DIAGNOSIS — E119 Type 2 diabetes mellitus without complications: Secondary | ICD-10-CM | POA: Diagnosis not present

## 2012-05-04 DIAGNOSIS — Z79899 Other long term (current) drug therapy: Secondary | ICD-10-CM | POA: Diagnosis not present

## 2012-05-04 LAB — CBC WITH DIFFERENTIAL/PLATELET
Basophils Absolute: 0 10*3/uL (ref 0.0–0.1)
Basophils Relative: 1 % (ref 0–1)
MCHC: 32.5 g/dL (ref 30.0–36.0)
Neutro Abs: 6 10*3/uL (ref 1.7–7.7)
Neutrophils Relative %: 78 % — ABNORMAL HIGH (ref 43–77)
RDW: 15.6 % — ABNORMAL HIGH (ref 11.5–15.5)

## 2012-05-04 LAB — URINALYSIS, ROUTINE W REFLEX MICROSCOPIC
Bilirubin Urine: NEGATIVE
Glucose, UA: NEGATIVE mg/dL
Ketones, ur: NEGATIVE mg/dL
Leukocytes, UA: NEGATIVE
pH: 7 (ref 5.0–8.0)

## 2012-05-04 LAB — COMPREHENSIVE METABOLIC PANEL
ALT: 12 U/L (ref 0–35)
Albumin: 3.7 g/dL (ref 3.5–5.2)
Alkaline Phosphatase: 40 U/L (ref 39–117)
Potassium: 3.8 mEq/L (ref 3.5–5.1)
Sodium: 140 mEq/L (ref 135–145)
Total Protein: 7 g/dL (ref 6.0–8.3)

## 2012-05-04 LAB — D-DIMER, QUANTITATIVE: D-Dimer, Quant: 0.34 ug/mL-FEU (ref 0.00–0.48)

## 2012-05-04 LAB — PRO B NATRIURETIC PEPTIDE: Pro B Natriuretic peptide (BNP): 4103 pg/mL — ABNORMAL HIGH (ref 0–125)

## 2012-05-04 MED ORDER — AZITHROMYCIN 250 MG PO TABS: 250.0000 mg | ORAL_TABLET | Freq: Every day | ORAL | Status: DC

## 2012-05-04 NOTE — ED Notes (Signed)
Meal given to patient 

## 2012-05-04 NOTE — ED Notes (Signed)
Assisted patient to the bathroom. 

## 2012-05-04 NOTE — ED Notes (Signed)
Pt states that for the last 1 to 2 weeks she has been having a increase is SOB.  Pt is able to talk in complete sentences.

## 2012-05-04 NOTE — ED Provider Notes (Signed)
Monica Lee is a 62 y.o. female here for evaluation of cough, ongoing for 3 weeks. The cough is productive of clear sputum. She denies chest pain. She is using inhaler and has plenty of that, at home.  Exam: Arrival vital signs normal except mild hypertension. Heart regular rate and rhythm. No murmur. Lungs clear to auscultation. No wheezes, rales, or rhonchi.   Weight today 194 pounds. This is near her baseline. She has a history of CHF with an ejection fraction of 55%.   Assessment: post URI, bronchitis. She is stable for discharge        Medical screening examination/treatment/procedure(s) were conducted as a shared visit with non-physician practitioner(s) and myself.  I personally evaluated the patient during the encounter  Flint Melter, MD 05/04/12 1742

## 2012-05-04 NOTE — ED Provider Notes (Signed)
History     CSN: [id number]  Arrival date & time 05/04/12  1039   First MD Initiated Contact with Patient 05/04/12 1140      Chief Complaint  Patient presents with  . Shortness of Breath    (Consider location/radiation/quality/duration/timing/severity/associated sxs/prior treatment) HPI  Patient is a 62 year old female past medical history significant for congestive heart failure, diabetes mellitus, status post kidney transplant, asthma, MI stroke presenting for a three-week duration of upper respiratory symptoms including a nonproductive cough. She states congestion and rhinorrhea are improved but continues to have nonproductive cough with some associated dyspnea. Patient states she uses her rescue inhaler home with some relief. Pt does not have home oxygen. Denies any fevers, chills, nausea, vomiting, diarrhea, leg swelling, dizziness, headache, lightheadedness. Weight is stable for patient.   Past Medical History  Diagnosis Date  . CHF (congestive heart failure)   . Diabetes mellitus   . Acid reflux   . S/P kidney transplant   . CAD (coronary artery disease)     native vessel  . GERD (gastroesophageal reflux disease)   . Diastolic dysfunction   . Hyperlipidemia   . DJD (degenerative joint disease)   . TIA (transient ischemic attack)   . Hx of colonic polyps   . PUD (peptic ulcer disease)   . Diabetic retinopathy(362.0)   . Heart murmur   . Hemorrhoids   . Generalized headaches   . Hypertension   . Asthma   . MI (myocardial infarction) 2011  . Kidney Smarr   . Nephrolithiasis   . Mental disorder   . Stroke 2008    no deficits  . Anemia     Hx-not current    Past Surgical History  Procedure Laterality Date  . Transplantation renal  05/2006  . Cardiac catheterization  2011  . Abdominal hysterectomy    . Cataract extraction  bil  . Intestinal perforation surgery  01/2009  . Breast biopsy  2010  . Gallstones removed    . Cholecystectomy  2008  . Colon surgery   2013    polyps removed  . Hemorrhoid surgery  01/23/2012    Procedure: HEMORRHOIDECTOMY PROLAPSED;  Surgeon: Romie Levee, MD;  Location: [facility];  Service: General;  Laterality: N/A;    Family History  Problem Relation Age of Onset  . Kidney disease Neg Hx   . Arthritis Other     parent  . Heart disease Other     parent  . Hypertension Other     parent  . Diabetes Other     parent  . Diabetes Other     grandparent  . Thyroid disease Other     several  . Heart disease Father     History  Substance Use Topics  . Smoking status: Never Smoker   . Smokeless tobacco: Never Used  . Alcohol Use: No    OB History   Grav Para Term Preterm Abortions TAB SAB Ect Mult Living                  Review of Systems  Constitutional: Negative.   HENT: Negative.   Eyes: Negative for visual disturbance.  Respiratory: Positive for cough and shortness of breath.   Cardiovascular: Negative for chest pain and leg swelling.  Gastrointestinal: Negative for nausea, vomiting and abdominal pain.  Genitourinary: Negative for dysuria and hematuria.  Musculoskeletal: Negative for back pain.  Skin: Negative.   Neurological: Negative.     Allergies  Cephalexin and  Propoxyphene-acetaminophen  Home Medications   Current Outpatient Rx  Name  Route  Sig  Dispense  Refill  . albuterol (PROVENTIL HFA;VENTOLIN HFA) 108 (90 BASE) MCG/ACT inhaler   Inhalation   Inhale 2 puffs into the lungs every 6 (six) hours as needed for wheezing.         Marland Kitchen amLODipine (NORVASC) 10 MG tablet   Oral   Take 10 mg by mouth daily.         Marland Kitchen aspirin EC 325 MG tablet   Oral   Take 325 mg by mouth daily.           Marland Kitchen azaTHIOprine (IMURAN) 50 MG tablet   Oral   Take 50 mg by mouth 2 (two) times daily.           . brimonidine (ALPHAGAN) 0.2 % ophthalmic solution   Both Eyes   Place 1 drop into both eyes 2 (two) times daily.           . brimonidine-timolol (COMBIGAN) 0.2-0.5 %  ophthalmic solution   Both Eyes   Place 1 drop into both eyes 2 (two) times daily.           . calcitRIOL (ROCALTROL) 0.25 MCG capsule   Oral   Take 1 mcg by mouth 3 (three) times daily.          . cinacalcet (SENSIPAR) 30 MG tablet   Oral   Take 1 tablet (30 mg total) by mouth daily.   30 tablet   11   . cloNIDine (CATAPRES) 0.2 MG tablet   Oral   Take 0.2 mg by mouth 2 (two) times daily.          Marland Kitchen docusate sodium (COLACE) 100 MG capsule   Oral   Take 1 capsule (100 mg total) by mouth 2 (two) times daily.   60 capsule   0   . esomeprazole (NEXIUM) 40 MG capsule   Oral   Take 40 mg by mouth daily before breakfast.         . furosemide (LASIX) 80 MG tablet   Oral   Take 160 mg by mouth 2 (two) times daily.          . insulin aspart (NOVOLOG) 100 UNIT/ML injection   Subcutaneous   Inject 15 Units into the skin 3 (three) times daily before meals.         . insulin glargine (LANTUS) 100 UNIT/ML injection   Subcutaneous   Inject 0.6 mLs (60 Units total) into the skin daily.   30 mL   12   . isosorbide mononitrate (IMDUR) 60 MG 24 hr tablet   Oral   Take 60 mg by mouth daily.         . Magnesium Oxide 250 MG TABS   Oral   Take 1 tablet by mouth daily.          . metoprolol (LOPRESSOR) 100 MG tablet   Oral   Take 150 mg by mouth 2 (two) times daily.         . minoxidil (LONITEN) 2.5 MG tablet   Oral   Take 2.5 mg by mouth 2 (two) times daily.         . Multiple Vitamin (MULTIVITAMIN) capsule   Oral   Take 1 capsule by mouth daily.           . nitroGLYCERIN (NITROSTAT) 0.4 MG SL tablet   Sublingual   Place 1 tablet (0.4 mg total) under  the tongue every 5 (five) minutes as needed for chest pain.   25 tablet   11   . polyethylene glycol (MIRALAX / GLYCOLAX) packet   Oral   Take 17 g by mouth daily.         . potassium chloride (KLOR-CON 10) 10 MEQ tablet   Oral   Take 1 tablet (10 mEq total) by mouth 2 (two) times daily.   90  tablet   3   . pravastatin (PRAVACHOL) 20 MG tablet   Oral   Take 10 mg by mouth at bedtime.           . tacrolimus (PROGRAF) 1 MG capsule   Oral   Take 9 mg by mouth 2 (two) times daily.          . timolol (TIMOPTIC) 0.25 % ophthalmic solution   Right Eye   Place 1 drop into the right eye 2 (two) times daily.          . traMADol (ULTRAM) 50 MG tablet   Oral   Take 1 tablet (50 mg total) by mouth every 8 (eight) hours as needed for pain.   60 tablet   2     BP 157/80  Pulse 62  Temp(Src) 97.1 F (36.2 C) (Oral)  Resp 14  SpO2 97%  Physical Exam  Constitutional: She is oriented to person, place, and time. She appears well-developed and well-nourished. No distress.  HENT:  Head: Normocephalic and atraumatic.  Mouth/Throat: Oropharynx is clear and moist. No oropharyngeal exudate.  Eyes: EOM are normal. Pupils are equal, round, and reactive to light. Right eye exhibits no discharge. Left eye exhibits no discharge.  Neck: Normal range of motion. Neck supple.  Cardiovascular: Normal rate, regular rhythm and normal heart sounds.   Pulmonary/Chest: Effort normal and breath sounds normal. No respiratory distress. She has no wheezes. She has no rales.  Abdominal: Soft. Bowel sounds are normal. There is no tenderness.  Lymphadenopathy:    She has no cervical adenopathy.  Neurological: She is alert and oriented to person, place, and time.  Skin: Skin is warm and dry. No rash noted.  Psychiatric: She has a normal mood and affect.    ED Course  Procedures (including critical care time)  Labs Reviewed  CBC WITH DIFFERENTIAL - Abnormal; Notable for the following:    RDW 15.6 (*)    Neutrophils Relative 78 (*)    All other components within normal limits  COMPREHENSIVE METABOLIC PANEL - Abnormal; Notable for the following:    CO2 33 (*)    BUN 26 (*)    Creatinine, Ser 1.42 (*)    Total Bilirubin 0.2 (*)    GFR calc non Af Amer 39 (*)    GFR calc Af Amer 45 (*)     All other components within normal limits  URINE CULTURE  D-DIMER, QUANTITATIVE  URINALYSIS, ROUTINE W REFLEX MICROSCOPIC  PRO B NATRIURETIC PEPTIDE   Dg Chest 2 View  05/04/2012  *RADIOLOGY REPORT*  Clinical Data: Shortness of breath.  Asthma.  CHEST - 2 VIEW  Comparison: None.  Findings: The cardiopericardial silhouette is enlarged. Interstitial markings are diffusely coarsened with chronic features. The lungs are clear without focal infiltrate, edema, pneumothorax or pleural effusion. Imaged bony structures of the thorax are intact.  IMPRESSION: Cardiomegaly without acute cardiopulmonary process.   Original Report Authenticated By: Kennith Center, M.D.    Patient able to ambulate w/o respiratory difficulty. O2 sats stable while in ED.  1. Acute bronchitis       MDM  Pt CXR negative for acute infiltrate. Patients symptoms are consistent with acute bronchitis. Discussed that antibiotics are indicated for infection and patient immunocompromised status. Pt will be discharged with antibiotics and symptomatic treatment including inhaler use.  Verbalizes understanding and is agreeable with plan. Pts daughters requested BNP check, BNP is elevated w/o clinical signs of worsening CHF on presentation or with imaging. Pt advised to follow up with PCP regarding elevation. Pt is hemodynamically stable & in NAD prior to dc. Able to ambulate in ED w/o distress. Patient agreeable to plan.  Patient d/w with Dr. Effie Shy, agrees with plan. Patient is stable at time of discharge           Jeannetta Ellis, PA-C 05/04/12 1609

## 2012-05-04 NOTE — ED Notes (Signed)
Pt c/o SOB with cough and aches x 3-4 weeks; pt sts URI sx; pt denies leg swelling

## 2012-05-06 LAB — URINE CULTURE: Colony Count: 65000

## 2012-05-11 DIAGNOSIS — Z79899 Other long term (current) drug therapy: Secondary | ICD-10-CM | POA: Diagnosis not present

## 2012-05-11 DIAGNOSIS — Z940 Kidney transplant status: Secondary | ICD-10-CM | POA: Diagnosis not present

## 2012-05-11 DIAGNOSIS — E213 Hyperparathyroidism, unspecified: Secondary | ICD-10-CM | POA: Diagnosis not present

## 2012-05-18 ENCOUNTER — Other Ambulatory Visit: Payer: Self-pay | Admitting: Internal Medicine

## 2012-05-18 DIAGNOSIS — L608 Other nail disorders: Secondary | ICD-10-CM | POA: Diagnosis not present

## 2012-05-18 DIAGNOSIS — I739 Peripheral vascular disease, unspecified: Secondary | ICD-10-CM | POA: Diagnosis not present

## 2012-05-18 DIAGNOSIS — E1059 Type 1 diabetes mellitus with other circulatory complications: Secondary | ICD-10-CM | POA: Diagnosis not present

## 2012-05-18 DIAGNOSIS — L84 Corns and callosities: Secondary | ICD-10-CM | POA: Diagnosis not present

## 2012-05-28 ENCOUNTER — Other Ambulatory Visit: Payer: Self-pay | Admitting: Cardiology

## 2012-05-28 ENCOUNTER — Encounter: Payer: Self-pay | Admitting: Internal Medicine

## 2012-05-28 ENCOUNTER — Ambulatory Visit (INDEPENDENT_AMBULATORY_CARE_PROVIDER_SITE_OTHER): Payer: Medicare Other | Admitting: Internal Medicine

## 2012-05-28 VITALS — BP 126/80 | HR 75 | Ht 65.0 in | Wt 189.0 lb

## 2012-05-28 DIAGNOSIS — E1159 Type 2 diabetes mellitus with other circulatory complications: Secondary | ICD-10-CM

## 2012-05-28 NOTE — Progress Notes (Signed)
Patient ID: Monica Lee, female   DOB: 1950-08-22, 62 y.o.   MRN: [id number]  HPI: Monica Lee is a 62 y.o.-year-old woman, returning for f/u for DM2, dx 1980s, insulin-dependent, uncontrolled, with multiple complications (diabetic retinopathy, history of cadaveric kidney transplant in 05/2006 - now with CKD stage III, gastroparesis, CAD-history of MI in 2011, CHF- diastolic dysfunction, history of TIA and stroke in 2008).  Last hemoglobin A1c was: Lab Results  Component Value Date   HGBA1C 9.1* 04/09/2012  Prev. 8.6%.  Pt is on a regimen of: - Lantus 60 units qhs, decreased from 65 units at last visit due to lows 6 - Novolog 15-15-18 units (tid) ac, decreased a little at last visit, and sliding scale added She was telling me at last visit that if she did not eat carbs in am or if fasting sugar <110, she was not taking the morning insulin  Pt checks her sugars 3-4x a day and they are: - am: 59-144 >> 79-160 (most sugars 90-130) now - pre-lunch: 55-174 >> 83-190, mostly low 100s now - pre-dinner: 77-197>> 73-176, mostly low 100s - bedtime: 81-144 >> 08,657,846  She started not to have low CBGs anymore - right after we made the above changes, patient had 3 lows of 50, 67, and 60, all 3 before dinner, however no sugars lower than 73 in the last 2 weeks; she has hypoglycemia awareness at 80. Highest sugar was 197 in last 2 weeks.  The patient lost 7 pounds since last visit, and she attributes this to cutting out the snack at bedtime and also decreasing the use of bread.  Pt does not have chronic kidney disease, last BUN/creatinine was:  Lab Results  Component Value Date   BUN 26* 05/04/2012   CREATININE 1.42* 05/04/2012   Last set of lipids: Lab Results  Component Value Date   CHOL 150 04/09/2012   HDL 39.80 04/09/2012   LDLCALC 68 09/04/2010   LDLDIRECT 70.6 04/09/2012   TRIG 326.0* 04/09/2012   CHOLHDL 4 04/09/2012   Pt's last eye exam was in 12/2011. She tells me there were no  problems then but she had surgeries in the left eye for bleeding (?). Denies numbness and tingling in her legs. She has a history of renal transplant in 2008, and she is taking Prograf, Imuran, prednisone 10 mg daily. Her most recent creatinine was 1.5. She also sees podiatry.  She also has a history of hypertension, hyperlipidemia, GERD, DJD, headaches, history of nephrolithiasis, history of anemia, cataracts status post surgery, history of hyperthyroidism.   ROS: Constitutional: + weight loss, no fatigue, no subjective hyperthermia/hypothermia Eyes: no blurry vision, no xerophthalmia ENT: no sore throat, no nodules palpated in throat, no dysphagia/odynophagia, no hoarseness Cardiovascular: no CP/SOB/palpitations/leg swelling Respiratory: no cough/SOB Gastrointestinal: no N/V/D/C Musculoskeletal: no muscle/joint aches Skin: + rash and itching upper medial right arm - previous bruise, now fading Neurological: no tremors/numbness/tingling/dizziness Psychiatric: no depression/anxiety  PE: BP 126/80  Pulse 75  Ht 5\' 5"  (1.651 m)  Wt 189 lb (85.73 kg)  BMI 31.45 kg/m2  SpO2 98% Wt Readings from Last 3 Encounters:  05/28/12 189 lb (85.73 kg)  04/28/12 196 lb (88.905 kg)  04/09/12 197 lb 6 oz (89.529 kg)   Constitutional: overweight, in NAD Eyes: PERRLA, EOMI, no exophthalmos ENT: moist mucous membranes, no thyromegaly, no cervical lymphadenopathy Cardiovascular: RRR, No MRG Respiratory: CTA B Gastrointestinal: abdomen soft, NT, ND, BS+ Musculoskeletal: no deformities, strength intact in all 4 Skin: moist, warm, upper  medial right arm - previous large bruise, now fading Neurological: no tremor with outstretched hands, DTR normal in all 4 Foot exam performed today  ASSESSMENT: 1. 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 Darrick Penna - gastroparesis -  CAD-history of MI in 2011, last cardiac cath was in 2011 - CHF-diastolic dysfunction, sees Dr. Daleen Squibb - history of TIA and stroke in 2008  PLAN:  1. Patient with very long-standing diabetes, which improved recently due to reducing the amount of insulin that she takes but also instituting healthy changes in her diet. We reviewed her detailed CBG log together. She does not have any more lows, and the highest sugars now are under 200. I congratulated her about her improved sugars, and also improving her diet and losing the weight. I encouraged her to continue doing so.  - I will not change her insulin regimen for now - Advise her to join my chart - no labs for today, we'll need a hemoglobin A1c at next visit - I will see her back in 3 months with her sugar log

## 2012-05-28 NOTE — Patient Instructions (Signed)
Please return in 3 months.  KEEP UP THE GREAT WORK!!!!!!!

## 2012-06-05 DIAGNOSIS — E213 Hyperparathyroidism, unspecified: Secondary | ICD-10-CM | POA: Diagnosis not present

## 2012-06-05 DIAGNOSIS — Z79899 Other long term (current) drug therapy: Secondary | ICD-10-CM | POA: Diagnosis not present

## 2012-06-05 DIAGNOSIS — Z940 Kidney transplant status: Secondary | ICD-10-CM | POA: Diagnosis not present

## 2012-06-09 ENCOUNTER — Other Ambulatory Visit: Payer: Self-pay | Admitting: Internal Medicine

## 2012-06-09 NOTE — Telephone Encounter (Signed)
Done erx 

## 2012-07-04 ENCOUNTER — Other Ambulatory Visit: Payer: Self-pay | Admitting: Cardiology

## 2012-07-07 DIAGNOSIS — E1139 Type 2 diabetes mellitus with other diabetic ophthalmic complication: Secondary | ICD-10-CM | POA: Diagnosis not present

## 2012-07-07 DIAGNOSIS — E1165 Type 2 diabetes mellitus with hyperglycemia: Secondary | ICD-10-CM | POA: Diagnosis not present

## 2012-07-07 DIAGNOSIS — E11359 Type 2 diabetes mellitus with proliferative diabetic retinopathy without macular edema: Secondary | ICD-10-CM | POA: Diagnosis not present

## 2012-07-07 DIAGNOSIS — H44529 Atrophy of globe, unspecified eye: Secondary | ICD-10-CM | POA: Diagnosis not present

## 2012-07-07 DIAGNOSIS — H25019 Cortical age-related cataract, unspecified eye: Secondary | ICD-10-CM | POA: Diagnosis not present

## 2012-07-14 DIAGNOSIS — Z940 Kidney transplant status: Secondary | ICD-10-CM | POA: Diagnosis not present

## 2012-07-14 DIAGNOSIS — Z79899 Other long term (current) drug therapy: Secondary | ICD-10-CM | POA: Diagnosis not present

## 2012-07-14 DIAGNOSIS — E213 Hyperparathyroidism, unspecified: Secondary | ICD-10-CM | POA: Diagnosis not present

## 2012-07-20 DIAGNOSIS — E119 Type 2 diabetes mellitus without complications: Secondary | ICD-10-CM | POA: Diagnosis not present

## 2012-07-20 DIAGNOSIS — Z940 Kidney transplant status: Secondary | ICD-10-CM | POA: Diagnosis not present

## 2012-07-20 DIAGNOSIS — I10 Essential (primary) hypertension: Secondary | ICD-10-CM | POA: Diagnosis not present

## 2012-07-20 DIAGNOSIS — N2581 Secondary hyperparathyroidism of renal origin: Secondary | ICD-10-CM | POA: Diagnosis not present

## 2012-07-24 ENCOUNTER — Other Ambulatory Visit: Payer: Self-pay

## 2012-07-24 MED ORDER — AMLODIPINE BESYLATE 10 MG PO TABS: 10.0000 mg | ORAL_TABLET | Freq: Every day | ORAL | Status: DC

## 2012-07-28 DIAGNOSIS — Z940 Kidney transplant status: Secondary | ICD-10-CM | POA: Diagnosis not present

## 2012-07-28 DIAGNOSIS — I120 Hypertensive chronic kidney disease with stage 5 chronic kidney disease or end stage renal disease: Secondary | ICD-10-CM | POA: Diagnosis not present

## 2012-07-29 ENCOUNTER — Encounter: Payer: Self-pay | Admitting: Nephrology

## 2012-07-29 ENCOUNTER — Other Ambulatory Visit: Payer: Self-pay

## 2012-07-29 DIAGNOSIS — Z1231 Encounter for screening mammogram for malignant neoplasm of breast: Secondary | ICD-10-CM

## 2012-08-10 DIAGNOSIS — Z940 Kidney transplant status: Secondary | ICD-10-CM | POA: Diagnosis not present

## 2012-08-17 ENCOUNTER — Telehealth: Payer: Self-pay | Admitting: *Deleted

## 2012-08-18 NOTE — Telephone Encounter (Signed)
error 

## 2012-08-19 ENCOUNTER — Other Ambulatory Visit: Payer: Self-pay | Admitting: *Deleted

## 2012-08-19 MED ORDER — METOPROLOL TARTRATE 100 MG PO TABS: 150.0000 mg | ORAL_TABLET | Freq: Two times a day (BID) | ORAL | Status: DC

## 2012-08-24 ENCOUNTER — Telehealth: Payer: Self-pay | Admitting: *Deleted

## 2012-08-24 NOTE — Telephone Encounter (Signed)
A user error has taken place.

## 2012-08-24 NOTE — Telephone Encounter (Signed)
Miranda called states the [employer] Order is to be filled out in order for the pt's Medicare Part B can be filed.  Please complete form and return it to [phone] fax.

## 2012-08-28 ENCOUNTER — Encounter: Payer: Self-pay | Admitting: Internal Medicine

## 2012-08-28 ENCOUNTER — Ambulatory Visit (INDEPENDENT_AMBULATORY_CARE_PROVIDER_SITE_OTHER): Payer: Medicare Other | Admitting: Internal Medicine

## 2012-08-28 ENCOUNTER — Telehealth: Payer: Self-pay | Admitting: *Deleted

## 2012-08-28 VITALS — BP 138/76 | HR 66 | Temp 97.5°F | Resp 12 | Wt 189.0 lb

## 2012-08-28 DIAGNOSIS — E1159 Type 2 diabetes mellitus with other circulatory complications: Secondary | ICD-10-CM

## 2012-08-28 NOTE — Telephone Encounter (Signed)
Called pt and told her that her HgbA1c is 9.0, compared to 9.1, 5 months ago. However, after I reviewed her sugar log, She believes that the hemoglobin A1c is artificially elevated since it is not consistent with her sugars. I will go by the sugars. She should continue the insulin regimen that she discussed with you about at the time of the visit. Pt understood.

## 2012-08-28 NOTE — Progress Notes (Signed)
Patient ID: Monica Lee, female   DOB: 11/19/1950, 62 y.o.   MRN: [id number]  HPI: Monica Lee is a 62 y.o.-year-old woman, returning for f/u for DM2, dx 1980s, insulin-dependent, uncontrolled, with multiple complications (diabetic retinopathy, history of cadaveric kidney transplant in 05/2006 - now with CKD stage III, gastroparesis, CAD-history of MI in 2011, CHF- diastolic dysfunction, history of TIA and stroke in 2008). Last visit 3 mo ago.  Last hemoglobin A1c was: Lab Results  Component Value Date   HGBA1C 9.1* 04/09/2012  Prev. 8.6%.  Pt is on a regimen of: - Lantus 60 units qhs, decreased from 65 units at last visit due to lows - Novolog 15-15-18 units (tid) ac, decreased a little at last visit, and sliding scale added: - Novolog SSI: target 150, ISF 10 She was telling me at last visit that if she did not eat carbs in am or if fasting sugar <110, she was not taking the morning insulin  Pt checks her sugars 3-4x a day and they are (reviewed her great log): - am: 59-144 >> 79-160 (most sugars 90-130) >> 57-190, mostly 100-130 - pre-lunch: 55-174 >> 83-190, mostly low 100s >> 63-168, mostly ~100 - pre-dinner: 77-197>> 73-176, mostly low 100s >> not checking - bedtime: 81-144 >> 64,150,177 >> 101-198, mostly 100-150  She still has some low CBGs 50s-60s, but no pattern - not lately (in last 3 weeks) as she started to eat a little more!; she has hypoglycemia awareness at 80.  Highest sugar was 250 in last mo (x1)  - pt has chronic kidney disease, last BUN/creatinine increased last Cr 2.0 on 07/20/2012- per nephrology.  - Last set of lipids: Lab Results  Component Value Date   CHOL 150 04/09/2012   HDL 39.80 04/09/2012   LDLCALC 68 09/04/2010   LDLDIRECT 70.6 04/09/2012   TRIG 326.0* 04/09/2012   CHOLHDL 4 04/09/2012   - last eye exam was in 12/2011. She had surgeries in the left eye for bleeding. - Denies numbness and tingling in her legs. She sees podiatry.  She also has a  history of hypertension, hyperlipidemia, GERD, DJD, headaches, history of nephrolithiasis, history of anemia, cataracts status post surgery, history of hyperthyroidism.   I reviewed pt's medications, allergies, PMH, social hx, family hx and no changes required, except as mentioned above.  ROS: Constitutional: no weight loss, no fatigue, + subjective hyperthermia Eyes: + blurry vision, no xerophthalmia ENT: no sore throat, no nodules palpated in throat, no dysphagia/odynophagia, no hoarseness Cardiovascular: no CP/SOB/palpitations/leg swelling Respiratory: no cough/SOB Gastrointestinal: no N/V/D/C Musculoskeletal: no muscle/joint aches Skin: no rash, increased hair Neurological: no tremors/numbness/tingling/dizziness  PE: BP 138/76  Pulse 66  Temp(Src) 97.5 F (36.4 C) (Oral)  Resp 12  Wt 189 lb (85.73 kg)  BMI 31.45 kg/m2  SpO2 97% Wt Readings from Last 3 Encounters:  08/28/12 189 lb (85.73 kg)  05/28/12 189 lb (85.73 kg)  04/28/12 196 lb (88.905 kg)   Constitutional: overweight, in NAD Eyes: surgical left pupil, EOMI, no exophthalmos ENT: moist mucous membranes, no thyromegaly, no cervical lymphadenopathy Cardiovascular: RRR, No MRG Respiratory: CTA B Gastrointestinal: abdomen soft, NT, ND, BS+ Musculoskeletal: no deformities, strength intact in all 4 Skin: moist, warm, upper medial right arm - previous large bruise, now fading Neurological: no tremor with outstretched hands, DTR normal in all 4  ASSESSMENT: 1. 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 Darrick Penna -  gastroparesis - CAD-history of MI in 2011, last cardiac cath was in 2011 - CHF-diastolic dysfunction, sees Dr. Daleen Squibb - history of TIA and stroke in 2008  PLAN:  1. Patient with very long-standing diabetes, which improved recently due to reducing the amount of insulin that she takes but  also instituting healthy changes in her diet.  - We reviewed her detailed CBG log together. She still has lows, and few CBGs >160. - we will reduce her insulin: - decrease Lantus to 55 units - decrease mealtime Novolog to 13-13-16 - keep the current correction scale - advised her to not overeat to keep up with the insulin, but reduce the insulin a little if still has lows - Advise her to join my chart - will check a hemoglobin A1c today - I will see her back in 3 months with her sugar log  Office Visit on 08/28/2012  Component Date Value Range Status  . Hemoglobin A1C 08/28/2012 9.0* 4.6 - 6.5 % Final   Glycemic Control Guidelines for People with Diabetes:Non Diabetic:  <6%Goal of Therapy: <7%Additional Action Suggested:  >8%    I am very surprised by her hemoglobin A1c, which is discordant with her sugars. I reviewed her very detailed sugar log, with measurements taken at different times of the day, and I believe that the above value might not be correct. Will continue with the plan as outlined above.

## 2012-08-28 NOTE — Patient Instructions (Addendum)
Please stop at the lab. Please decrease the insulin as follows: - decrease Lantus to 55 units - decrease mealtime Novolog to 13-13-16 - keep the current correction scale Return in 1 month with your sugar log. Please try to join MyChart for easier communication.

## 2012-09-01 ENCOUNTER — Ambulatory Visit
Admission: RE | Admit: 2012-09-01 | Discharge: 2012-09-01 | Disposition: A | Discharge status: Home / Self Care | Payer: Medicare Other | Source: Ambulatory Visit

## 2012-09-01 DIAGNOSIS — Z1231 Encounter for screening mammogram for malignant neoplasm of breast: Secondary | ICD-10-CM

## 2012-09-02 ENCOUNTER — Other Ambulatory Visit: Payer: Self-pay | Admitting: Internal Medicine

## 2012-09-02 DIAGNOSIS — N63 Unspecified lump in unspecified breast: Secondary | ICD-10-CM

## 2012-09-07 ENCOUNTER — Telehealth: Payer: Self-pay | Admitting: Internal Medicine

## 2012-09-07 MED ORDER — MINOXIDIL 2.5 MG PO TABS: 2.5000 mg | ORAL_TABLET | Freq: Two times a day (BID) | ORAL | Status: DC

## 2012-09-07 NOTE — Telephone Encounter (Signed)
Request refill on   minoxidil (LONITEN) 2.5 MG tablet    Please call into [facility] in [place]. Please call patient once sent.

## 2012-09-07 NOTE — Telephone Encounter (Signed)
Patient informed. 

## 2012-09-07 NOTE — Telephone Encounter (Signed)
Done erx 

## 2012-09-09 ENCOUNTER — Telehealth: Payer: Self-pay

## 2012-09-09 NOTE — Telephone Encounter (Signed)
Called left msg to call back. 

## 2012-09-09 NOTE — Telephone Encounter (Signed)
Ok to add to list, but ask pt to seek refill with original prescribing MD, I suspect would be the same as rx her prograf

## 2012-09-09 NOTE — Telephone Encounter (Signed)
Pharmacy requesting refill on prednisone 10 mg qd #90.  Medication is not on list please advise

## 2012-09-10 NOTE — Telephone Encounter (Signed)
Spoke with pt advised of MDs message.  Acknowledged understanding

## 2012-09-15 DIAGNOSIS — E213 Hyperparathyroidism, unspecified: Secondary | ICD-10-CM | POA: Diagnosis not present

## 2012-09-15 DIAGNOSIS — Z79899 Other long term (current) drug therapy: Secondary | ICD-10-CM | POA: Diagnosis not present

## 2012-09-15 DIAGNOSIS — Z940 Kidney transplant status: Secondary | ICD-10-CM | POA: Diagnosis not present

## 2012-09-21 ENCOUNTER — Telehealth: Payer: Self-pay | Admitting: Internal Medicine

## 2012-09-21 NOTE — Telephone Encounter (Signed)
Pt requesting a refill on Amlodipine 10mg  be sent to  [facility]

## 2012-09-21 NOTE — Telephone Encounter (Signed)
Spoke with pt advised Rxs are to be transferred from [facility] to [facility].  Also there is a valid Rx at [facility].

## 2012-09-28 ENCOUNTER — Other Ambulatory Visit: Payer: Self-pay | Admitting: *Deleted

## 2012-09-28 MED ORDER — AMLODIPINE BESYLATE 10 MG PO TABS: 10.0000 mg | ORAL_TABLET | Freq: Every day | ORAL | Status: DC

## 2012-10-05 ENCOUNTER — Encounter: Payer: Self-pay | Admitting: Internal Medicine

## 2012-10-05 ENCOUNTER — Ambulatory Visit (INDEPENDENT_AMBULATORY_CARE_PROVIDER_SITE_OTHER): Payer: Medicare Other | Admitting: Internal Medicine

## 2012-10-05 VITALS — BP 120/70 | HR 75 | Temp 98.2°F | Resp 12 | Wt 188.5 lb

## 2012-10-05 DIAGNOSIS — Z23 Encounter for immunization: Secondary | ICD-10-CM | POA: Diagnosis not present

## 2012-10-05 NOTE — Progress Notes (Signed)
Patient ID: Monica Lee, female   DOB: 10-16-1950, 62 y.o.   MRN: [id number]  HPI: Monica Lee is a 62 y.o.-year-old woman, returning for f/u for DM2, dx 1980s, insulin-dependent, uncontrolled, with multiple complications (diabetic retinopathy, history of cadaveric kidney transplant in 05/2006 - now with CKD stage III, gastroparesis, CAD-history of MI in 2011, CHF- diastolic dysfunction, history of TIA and stroke in 2008). Last visit 61 mo ago.  She is recovering from an URI, improved in the last 2 weeks, but still has some cough and SOB. No fever. Did not have the flu vaccine yet.   Last hemoglobin A1c was: Lab Results  Component Value Date   HGBA1C 9.0* 08/28/2012  previously 9.1%, previously 8.6%. At last visit, I was very surprised about her hemoglobin A1c result, since it was discordant with her very carefully documented sugar log. I would've expected her hemoglobin A1c to be closer to 7.   She is on Prednisone 10 mg daily for her transplant.   Pt is on a regimen of: - Lantus 55 units (decreased from 60 units qhs, decreased from 65 units) - Novolog 13-13-16 (decreased from 15-15-18 units ac) - Novolog SSI: target 150, ISF 10 She was telling me at last visits that if she did not eat carbs in am or if fasting sugar <110, she was not taking the morning insulin.  Pt checks her sugars 3-4x a day and they are (reviewed her great log): - am: 59-144 >> 79-160 (most sugars 90-130) >> 57-190, mostly 100-130 >> 75-170, 2 values >200 - pre-lunch: 55-174 >> 83-190, mostly low 100s >> 63-168, mostly ~100 >> 88-160s - pre-dinner: 77-197>> 73-176, mostly low 100s >> not checking >> 75-150s - bedtime: 81-144 >> 64,150,177 >> 101-198, mostly 100-150 >> 90-160  At last visit, she was having some low CBGs 50s-60s, she was compensating for these by eating a little more; after we decrease the insulin doses, she only had 2 x 65 and one at 61. she has hypoglycemia awareness at 80. Highest sugar was 250  in last mo (x1)  - pt has chronic kidney disease, last BUN/creatinine increased last Cr 2.0 on 07/20/2012- per nephrology.  - Last set of lipids: Lab Results  Component Value Date   CHOL 150 04/09/2012   HDL 39.80 04/09/2012   LDLCALC 68 09/04/2010   LDLDIRECT 70.6 04/09/2012   TRIG 326.0* 04/09/2012   CHOLHDL 4 04/09/2012   - last eye exam was in 12/2011. She had surgeries in the left eye for bleeding. - Denies numbness and tingling in her legs. She sees podiatry.  She also has a history of hypertension, hyperlipidemia, GERD, DJD, headaches, history of nephrolithiasis, history of anemia, cataracts status post surgery, history of hyperthyroidism.   I reviewed pt's medications, allergies, PMH, social hx, family hx and no changes required, except as mentioned above.  ROS: Constitutional: no weight loss, no fatigue, no subjective hyperthermia Eyes: no blurry vision, no xerophthalmia ENT: no sore throat, no nodules palpated in throat, no dysphagia/odynophagia, no hoarseness Cardiovascular: no CP/SOB/palpitations/leg swelling Respiratory: + cough/+ SOB/ + wheezing Gastrointestinal: no N/V/D/C Musculoskeletal: +  muscle/joint aches Skin: no rash  PE: BP 120/70  Pulse 75  Temp(Src) 98.2 F (36.8 C) (Oral)  Resp 12  Wt 188 lb 8 oz (85.503 kg)  BMI 31.37 kg/m2  SpO2 97% Wt Readings from Last 3 Encounters:  10/05/12 188 lb 8 oz (85.503 kg)  08/28/12 189 lb (85.73 kg)  05/28/12 189 lb (85.73 kg)  Constitutional: overweight, in NAD Eyes: surgical left pupil, EOMI, no exophthalmos ENT: moist mucous membranes, no thyromegaly, no cervical lymphadenopathy Cardiovascular: RRR, No MRG Respiratory: CTA B Gastrointestinal: abdomen soft, NT, ND, BS+ Musculoskeletal: no deformities, strength intact in all 4 - swollen R elbow bursa Skin: moist, warm, upper medial right arm - previous large bruise, now fading Neurological: no tremor with outstretched hands, DTR normal in all  4  ASSESSMENT: 1. 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 Darrick Penna - gastroparesis - CAD-history of MI in 2011, last cardiac cath was in 2011 - CHF-diastolic dysfunction, sees Dr. Daleen Squibb - history of TIA and stroke in 2008  PLAN:  1. Patient with very long-standing diabetes, which improved recently due to reducing the amount of insulin that she takes but also instituting healthy changes in her diet.  - We reviewed her detailed CBG log together. She still has lows, but higher than 60 (only 3 values in the 60s), no lows at night.  - we will continue her insulin: -  Lantus 55 units - mealtime Novolog 13-13-16 - keep the current correction scale - Advise her to join my chart - will check a hemoglobin A1c at next visit - will give her the flu vaccine today - I will see her back in 2 months with her sugar log

## 2012-10-05 NOTE — Patient Instructions (Addendum)
Please continue your current insulin regimen: - Lantus 55 units  - mealtime Novolog 13-13-16 units  - keep the current correction scale Please call me if sugars are consistently <70 or >180. Please return in 2 months.

## 2012-10-08 ENCOUNTER — Other Ambulatory Visit: Payer: Self-pay | Admitting: Internal Medicine

## 2012-10-08 ENCOUNTER — Ambulatory Visit
Admission: RE | Admit: 2012-10-08 | Discharge: 2012-10-08 | Disposition: A | Discharge status: Home / Self Care | Payer: Medicare Other | Source: Ambulatory Visit | Attending: Internal Medicine | Admitting: Internal Medicine

## 2012-10-08 ENCOUNTER — Ambulatory Visit (INDEPENDENT_AMBULATORY_CARE_PROVIDER_SITE_OTHER): Payer: Medicare Other | Admitting: Internal Medicine

## 2012-10-08 ENCOUNTER — Encounter: Payer: Self-pay | Admitting: Internal Medicine

## 2012-10-08 VITALS — BP 120/80 | HR 78 | Temp 97.9°F | Ht 65.0 in | Wt 191.5 lb

## 2012-10-08 DIAGNOSIS — J45909 Unspecified asthma, uncomplicated: Secondary | ICD-10-CM

## 2012-10-08 DIAGNOSIS — M7020 Olecranon bursitis, unspecified elbow: Secondary | ICD-10-CM | POA: Diagnosis not present

## 2012-10-08 DIAGNOSIS — I10 Essential (primary) hypertension: Secondary | ICD-10-CM | POA: Diagnosis not present

## 2012-10-08 DIAGNOSIS — M7021 Olecranon bursitis, right elbow: Secondary | ICD-10-CM

## 2012-10-08 DIAGNOSIS — E785 Hyperlipidemia, unspecified: Secondary | ICD-10-CM | POA: Diagnosis not present

## 2012-10-08 DIAGNOSIS — Z23 Encounter for immunization: Secondary | ICD-10-CM | POA: Diagnosis not present

## 2012-10-08 DIAGNOSIS — R928 Other abnormal and inconclusive findings on diagnostic imaging of breast: Secondary | ICD-10-CM | POA: Diagnosis not present

## 2012-10-08 DIAGNOSIS — C50919 Malignant neoplasm of unspecified site of unspecified female breast: Secondary | ICD-10-CM | POA: Diagnosis not present

## 2012-10-08 DIAGNOSIS — N63 Unspecified lump in unspecified breast: Secondary | ICD-10-CM

## 2012-10-08 DIAGNOSIS — D0590 Unspecified type of carcinoma in situ of unspecified breast: Secondary | ICD-10-CM | POA: Diagnosis not present

## 2012-10-08 LAB — HM MAMMOGRAPHY

## 2012-10-08 MED ORDER — TRAMADOL HCL 50 MG PO TABS: ORAL_TABLET | ORAL | Status: DC

## 2012-10-08 MED ORDER — ALBUTEROL SULFATE 0.63 MG/3ML IN NEBU: 1.0000 | INHALATION_SOLUTION | Freq: Four times a day (QID) | RESPIRATORY_TRACT | Status: DC | PRN

## 2012-10-08 NOTE — Progress Notes (Addendum)
Subjective:    Patient ID: Roderic Scarce, female    DOB: Nov 09, 1950, 62 y.o.   MRN: [id number]  HPI  Here to f/u; overall doing ok,  Pt denies chest pain, increased sob or doe, wheezing, orthopnea, PND, increased LE swelling, palpitations, dizziness or syncope.  Pt denies polydipsia, polyuria, or low sugar symptoms such as weakness or confusion improved with po intake.  Pt denies new neurological symptoms such as new headache, or facial or extremity weakness or numbness.   Pt states overall good compliance with meds, Had flu shot last Monday.  Clonidine and calcitriol stopped per renal.  Has 2 wks onset nontender swelling at the tip of the right elbow after hitting on hard object.  Needs tramadol refill for prn arthritic pain, primarily right ankle s/p hx of fracture. Incidentally has right breast mass, for diagnostic mammogram later today.   Past Medical History  Diagnosis Date  . CHF (congestive heart failure)   . Diabetes mellitus   . Acid reflux   . S/P kidney transplant   . CAD (coronary artery disease)     native vessel  . GERD (gastroesophageal reflux disease)   . Diastolic dysfunction   . Hyperlipidemia   . DJD (degenerative joint disease)   . TIA (transient ischemic attack)   . Hx of colonic polyps   . PUD (peptic ulcer disease)   . Diabetic retinopathy   . Heart murmur   . Hemorrhoids   . Generalized headaches   . Hypertension   . Asthma   . MI (myocardial infarction) 2011  . Kidney [place]   . Nephrolithiasis   . Mental disorder   . Stroke 2008    no deficits  . Anemia     Hx-not current   Past Surgical History  Procedure Laterality Date  . Transplantation renal  05/2006  . Cardiac catheterization  2011  . Abdominal hysterectomy    . Cataract extraction  bil  . Intestinal perforation surgery  01/2009  . Breast biopsy  2010  . Gallstones removed    . Cholecystectomy  2008  . Colon surgery  2013    polyps removed  . Hemorrhoid surgery  01/23/2012    Procedure:  HEMORRHOIDECTOMY PROLAPSED;  Surgeon: Romie Levee, MD;  Location: [facility];  Service: General;  Laterality: N/A;    reports that she has never smoked. She has never used smokeless tobacco. She reports that she does not drink alcohol or use illicit drugs. family history includes Arthritis in her other; Diabetes in her other and other; Heart disease in her father and other; Hypertension in her other; Thyroid disease in her other. There is no history of Kidney disease. Allergies  Allergen Reactions  . Cephalexin     REACTION: edema/face  . Propoxyphene-Acetaminophen    Current Outpatient Prescriptions on File Prior to Visit  Medication Sig Dispense Refill  . albuterol (PROVENTIL HFA;VENTOLIN HFA) 108 (90 BASE) MCG/ACT inhaler Inhale 2 puffs into the lungs every 6 (six) hours as needed for wheezing.      Marland Kitchen amLODipine (NORVASC) 10 MG tablet Take 1 tablet (10 mg total) by mouth daily.  90 tablet  3  . aspirin EC 325 MG tablet Take 325 mg by mouth daily.        Marland Kitchen azaTHIOprine (IMURAN) 50 MG tablet Take 50 mg by mouth 2 (two) times daily.        . brimonidine (ALPHAGAN) 0.2 % ophthalmic solution Place 1 drop into both eyes 2 (  two) times daily.        . brimonidine-timolol (COMBIGAN) 0.2-0.5 % ophthalmic solution Place 1 drop into both eyes 2 (two) times daily.        . cinacalcet (SENSIPAR) 30 MG tablet Take 1 tablet (30 mg total) by mouth daily.  30 tablet  11  . docusate sodium (COLACE) 100 MG capsule Take 1 capsule (100 mg total) by mouth 2 (two) times daily.  60 capsule  0  . esomeprazole (NEXIUM) 40 MG capsule Take 40 mg by mouth daily before breakfast.      . furosemide (LASIX) 80 MG tablet Take 160 mg by mouth 2 (two) times daily.       . insulin aspart (NOVOLOG) 100 UNIT/ML injection Inject 15 Units into the skin 3 (three) times daily before meals.      . insulin glargine (LANTUS) 100 UNIT/ML injection Inject 0.6 mLs (60 Units total) into the skin daily.  30 mL  12  .  isosorbide mononitrate (IMDUR) 60 MG 24 hr tablet Take 60 mg by mouth daily.      . isosorbide mononitrate (IMDUR) 60 MG 24 hr tablet TAKE ONE TABLET BY MOUTH EVERY DAY  90 tablet  1  . Magnesium Oxide 250 MG TABS Take 1 tablet by mouth daily.       . metoprolol (LOPRESSOR) 100 MG tablet Take 100 mg by mouth 2 (two) times daily.      . minoxidil (LONITEN) 2.5 MG tablet Take 1 tablet (2.5 mg total) by mouth 2 (two) times daily.  60 tablet  5  . Multiple Vitamin (MULTIVITAMIN) capsule Take 1 capsule by mouth daily.        . nitroGLYCERIN (NITROSTAT) 0.4 MG SL tablet Place 1 tablet (0.4 mg total) under the tongue every 5 (five) minutes as needed for chest pain.  25 tablet  11  . polyethylene glycol (MIRALAX / GLYCOLAX) packet Take 17 g by mouth daily.      . potassium chloride (KLOR-CON 10) 10 MEQ tablet Take 1 tablet (10 mEq total) by mouth 2 (two) times daily.  90 tablet  3  . pravastatin (PRAVACHOL) 20 MG tablet Take 10 mg by mouth at bedtime.        . tacrolimus (PROGRAF) 1 MG capsule Take 9 mg by mouth 2 (two) times daily.       . timolol (TIMOPTIC) 0.25 % ophthalmic solution Place 1 drop into the right eye 2 (two) times daily.        No current facility-administered medications on file prior to visit.     Review of Systems  Constitutional: Negative for unexpected weight change, or unusual diaphoresis  HENT: Negative for tinnitus.   Eyes: Negative for photophobia and visual disturbance.  Respiratory: Negative for choking and stridor.   Gastrointestinal: Negative for vomiting and blood in stool.  Genitourinary: Negative for hematuria and decreased urine volume.  Musculoskeletal: Negative for acute joint swelling Skin: Negative for color change and wound.  Neurological: Negative for tremors and numbness other than noted  Psychiatric/Behavioral: Negative for decreased concentration or  hyperactivity.       Objective:   Physical Exam BP 120/80  Pulse 78  Temp(Src) 97.9 F (36.6 C)  (Oral)  Ht 5\' 5"  (1.651 m)  Wt 191 lb 8 oz (86.864 kg)  BMI 31.87 kg/m2  SpO2 96% VS noted,  Constitutional: Pt appears well-developed and well-nourished.  HENT: Head: NCAT.  Right Ear: External ear normal.  Left Ear: External ear normal.  Eyes: Conjunctivae and EOM are normal. Pupils are equal, round, and reactive to light.  Neck: Normal range of motion. Neck supple.  Cardiovascular: Normal rate and regular rhythm.   Pulmonary/Chest: Effort normal and breath sounds normal.  Abd:  Soft, NT, non-distended, + BS Neurological: Pt is alert. Not confused  Right elbow with 1.5 cm olecranon bursa swelling Skin: Skin is warm. No erythema.  Psychiatric: Pt behavior is normal. Thought content normal.     Assessment & Plan:  Quality Measures addressed:  Diabetes Hgba1c < 8%: pt declines further medication  ACE/ARB therapy for CAD, Diabetes, and/or LVSD: pt declines further medication

## 2012-10-08 NOTE — Patient Instructions (Signed)
You had the Prevnar (pneumonia) shot today Please take all new medication as prescribed - the nebulizer machine and medication Please continue all other medications as before, and refills have been done if requested - the tramadol Please keep your appointments with your specialists as you have planned - the diagnostic mammogram  Please remember to sign up for My Chart if you have not done so, as this will be important to you in the future with finding out test results, communicating by private email, and scheduling acute appointments online when needed.  Please return in 6 months, or sooner if needed

## 2012-10-11 DIAGNOSIS — M7021 Olecranon bursitis, right elbow: Secondary | ICD-10-CM | POA: Insufficient documentation

## 2012-10-11 NOTE — Assessment & Plan Note (Signed)
Mild to mod, nontender, no specific tx needed,  to f/u any worsening symptoms or concerns

## 2012-10-11 NOTE — Assessment & Plan Note (Signed)
stable overall by history and exam, recent data reviewed with pt, and pt to continue medical treatment as before,  to f/u any worsening symptoms or concerns SpO2 Readings from Last 3 Encounters:  10/08/12 96%  10/05/12 97%  08/28/12 97%

## 2012-10-11 NOTE — Assessment & Plan Note (Signed)
stable overall by history and exam, recent data reviewed with pt, and pt to continue medical treatment as before,  to f/u any worsening symptoms or concerns BP Readings from Last 3 Encounters:  10/08/12 120/80  10/05/12 120/70  08/28/12 138/76

## 2012-10-11 NOTE — Assessment & Plan Note (Signed)
stable overall by history and exam, recent data reviewed with pt, and pt to continue medical treatment as before,  to f/u any worsening symptoms or concerns Lab Results  Component Value Date   LDLCALC 68 09/04/2010    

## 2012-10-12 ENCOUNTER — Other Ambulatory Visit: Payer: Self-pay | Admitting: *Deleted

## 2012-10-12 ENCOUNTER — Telehealth: Payer: Self-pay | Admitting: *Deleted

## 2012-10-12 ENCOUNTER — Other Ambulatory Visit: Payer: Self-pay | Admitting: Oncology

## 2012-10-12 DIAGNOSIS — C50511 Malignant neoplasm of lower-outer quadrant of right female breast: Secondary | ICD-10-CM

## 2012-10-12 NOTE — Telephone Encounter (Signed)
Confirmed [facility] for 10/14/12 at 0800.  Instructions and contact information given. 

## 2012-10-14 ENCOUNTER — Ambulatory Visit (HOSPITAL_BASED_OUTPATIENT_CLINIC_OR_DEPARTMENT_OTHER): Payer: Medicare Other | Admitting: Oncology

## 2012-10-14 ENCOUNTER — Ambulatory Visit (HOSPITAL_BASED_OUTPATIENT_CLINIC_OR_DEPARTMENT_OTHER): Payer: Medicare Other | Admitting: Surgery

## 2012-10-14 ENCOUNTER — Ambulatory Visit
Admission: RE | Admit: 2012-10-14 | Discharge: 2012-10-14 | Disposition: A | Discharge status: Home / Self Care | Payer: Medicare Other | Source: Ambulatory Visit | Attending: Radiation Oncology | Admitting: Radiation Oncology

## 2012-10-14 ENCOUNTER — Ambulatory Visit: Payer: Medicare Other | Attending: Surgery | Admitting: Physical Therapy

## 2012-10-14 ENCOUNTER — Encounter (INDEPENDENT_AMBULATORY_CARE_PROVIDER_SITE_OTHER): Payer: Self-pay | Admitting: Surgery

## 2012-10-14 ENCOUNTER — Encounter: Payer: Self-pay | Admitting: Oncology

## 2012-10-14 ENCOUNTER — Ambulatory Visit: Payer: Medicare Other

## 2012-10-14 ENCOUNTER — Encounter: Payer: Self-pay | Admitting: *Deleted

## 2012-10-14 ENCOUNTER — Other Ambulatory Visit (HOSPITAL_BASED_OUTPATIENT_CLINIC_OR_DEPARTMENT_OTHER): Payer: Medicare Other | Admitting: Lab

## 2012-10-14 VITALS — BP 157/52 | HR 71 | Temp 97.7°F | Resp 20 | Ht 64.5 in | Wt 191.0 lb

## 2012-10-14 VITALS — BP 157/52 | HR 71 | Temp 97.7°F | Resp 20 | Ht 64.5 in | Wt 191.4 lb

## 2012-10-14 DIAGNOSIS — C50511 Malignant neoplasm of lower-outer quadrant of right female breast: Secondary | ICD-10-CM

## 2012-10-14 DIAGNOSIS — M159 Polyosteoarthritis, unspecified: Secondary | ICD-10-CM | POA: Diagnosis not present

## 2012-10-14 DIAGNOSIS — IMO0001 Reserved for inherently not codable concepts without codable children: Secondary | ICD-10-CM | POA: Diagnosis not present

## 2012-10-14 DIAGNOSIS — C50519 Malignant neoplasm of lower-outer quadrant of unspecified female breast: Secondary | ICD-10-CM

## 2012-10-14 DIAGNOSIS — Z171 Estrogen receptor negative status [ER-]: Secondary | ICD-10-CM | POA: Diagnosis not present

## 2012-10-14 DIAGNOSIS — Z940 Kidney transplant status: Secondary | ICD-10-CM | POA: Diagnosis not present

## 2012-10-14 DIAGNOSIS — C50919 Malignant neoplasm of unspecified site of unspecified female breast: Secondary | ICD-10-CM | POA: Insufficient documentation

## 2012-10-14 DIAGNOSIS — R293 Abnormal posture: Secondary | ICD-10-CM | POA: Diagnosis not present

## 2012-10-14 LAB — CBC WITH DIFFERENTIAL/PLATELET
Basophils Absolute: 0.1 10*3/uL (ref 0.0–0.1)
EOS%: 1 % (ref 0.0–7.0)
HGB: 12 g/dL (ref 11.6–15.9)
LYMPH%: 15.7 % (ref 14.0–49.7)
MCH: 29.2 pg (ref 25.1–34.0)
MCV: 89.7 fL (ref 79.5–101.0)
MONO%: 7.5 % (ref 0.0–14.0)
NEUT%: 74.6 % (ref 38.4–76.8)
Platelets: 196 10*3/uL (ref 145–400)
RDW: 15.8 % — ABNORMAL HIGH (ref 11.2–14.5)

## 2012-10-14 LAB — COMPREHENSIVE METABOLIC PANEL (CC13)
AST: 25 U/L (ref 5–34)
Albumin: 3.7 g/dL (ref 3.5–5.0)
Alkaline Phosphatase: 42 U/L (ref 40–150)
BUN: 27.3 mg/dL — ABNORMAL HIGH (ref 7.0–26.0)
CO2: 31 mEq/L — ABNORMAL HIGH (ref 22–29)
Creatinine: 1.9 mg/dL — ABNORMAL HIGH (ref 0.6–1.1)
Glucose: 296 mg/dl — ABNORMAL HIGH (ref 70–140)
Potassium: 4.1 mEq/L (ref 3.5–5.1)
Sodium: 140 mEq/L (ref 136–145)
Total Bilirubin: 0.28 mg/dL (ref 0.20–1.20)
Total Protein: 6.9 g/dL (ref 6.4–8.3)

## 2012-10-14 NOTE — Progress Notes (Signed)
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   HISTORY OF PRESENT ILLNESS:  Monica Lee is a 62 y.o. female who is seen today at the [facility]. for the courtesy of Dr. Jamey Ripa for evaluation of her T2 N0 invasive ductal/DCIS of the right breast. She felt a mass along the inferior aspect of her right breast at approximately 7:00. Mammography on 10/08/2012 showed  An irregular mass with internal pleomorphic calcifications. Ultrasound showed a suspicious 2.1 x 1.3 x 1.6 cm mass near the inframammary fold. A needle core biopsy on 10/08/2012 was diagnostic for invasive ductal carcinoma with DCIS. He was ER/PR negative and verbally were told that she is HER-2/neu negative (triple negative). Ki-67 is 50% and her invasive disease is grade II. She has significant medical comorbidities including having had a kidney transplant and insulin-dependent diabetes mellitus.  PREVIOUS RADIATION THERAPY: She has a history of radioactive iodine therapy for hyperthyroidism 1-2 years ago.   PAST MEDICAL HISTORY:  has a past medical history of CHF (congestive heart failure); Diabetes mellitus; Acid reflux; S/P kidney transplant; CAD (coronary artery disease); GERD (gastroesophageal reflux disease); Diastolic dysfunction; Hyperlipidemia; DJD (degenerative joint disease); TIA (transient ischemic attack); colonic polyps; PUD (peptic ulcer disease); Diabetic retinopathy; Heart murmur; Hemorrhoids; Generalized headaches; Hypertension; Asthma; MI (myocardial infarction) (2011); Kidney Grossi; Nephrolithiasis; Mental disorder; Stroke (2008); Anemia; and Breast cancer.     PAST SURGICAL HISTORY:  Past Surgical History  Procedure  Laterality Date  . Transplantation renal  05/2006  . Cardiac catheterization  2011  . Abdominal hysterectomy    . Cataract extraction  bil  . Intestinal perforation surgery  01/2009  . Breast biopsy  2010  . Gallstones removed    . Cholecystectomy  2008  . Colon surgery  2013    polyps removed  . Hemorrhoid surgery  01/23/2012    Procedure: HEMORRHOIDECTOMY PROLAPSED;  Surgeon: Romie Levee, MD;  Location: [facility];  Service: General;  Laterality: N/A;  . Eye surgery Left 1997     FAMILY HISTORY: family history includes Arthritis in her other; Diabetes in her other and other; Heart disease in her father and other; Hypertension in her other; Thyroid disease in her other. There is no history of Kidney disease. Her father died following a heart attack in his late 67s in her mother died in her 65s during childbirth.   SOCIAL HISTORY:  reports that she has never smoked. She has never used smokeless tobacco. She reports that she does not drink alcohol or use illicit drugs. Married, 4 children.   ALLERGIES: Cephalexin and Propoxyphene-acetaminophen   MEDICATIONS:  Current Outpatient Prescriptions  Medication Sig Dispense Refill  . albuterol (ACCUNEB) 0.63 MG/3ML nebulizer solution Take 3 mLs (0.63 mg total) by nebulization every 6 (six) hours as needed for wheezing.  75 mL  12  . albuterol (PROVENTIL HFA;VENTOLIN HFA) 108 (90 BASE) MCG/ACT inhaler Inhale 2 puffs into the lungs every 6 (six) hours as needed for wheezing.      Marland Kitchen amLODipine (NORVASC) 10 MG tablet Take 1 tablet (10 mg total) by mouth daily.  90 tablet  3  . aspirin  EC 325 MG tablet Take 325 mg by mouth daily.        Marland Kitchen azaTHIOprine (IMURAN) 50 MG tablet Take 50 mg by mouth 2 (two) times daily.        . brimonidine (ALPHAGAN) 0.2 % ophthalmic solution Place 1 drop into both eyes 2 (two) times daily.        . brimonidine-timolol (COMBIGAN) 0.2-0.5 % ophthalmic solution Place 1 drop into both eyes 2 (two) times  daily.        . cinacalcet (SENSIPAR) 30 MG tablet Take 1 tablet (30 mg total) by mouth daily.  30 tablet  11  . docusate sodium (COLACE) 100 MG capsule Take 1 capsule (100 mg total) by mouth 2 (two) times daily.  60 capsule  0  . esomeprazole (NEXIUM) 40 MG capsule Take 40 mg by mouth daily before breakfast.      . furosemide (LASIX) 80 MG tablet Take 160 mg by mouth 2 (two) times daily.       . insulin aspart (NOVOLOG) 100 UNIT/ML injection Inject 15 Units into the skin 3 (three) times daily before meals.      . insulin glargine (LANTUS) 100 UNIT/ML injection Inject 0.6 mLs (60 Units total) into the skin daily.  30 mL  12  . isosorbide mononitrate (IMDUR) 60 MG 24 hr tablet TAKE ONE TABLET BY MOUTH EVERY DAY  90 tablet  1  . Magnesium Oxide 250 MG TABS Take 1 tablet by mouth daily.       . metoprolol (LOPRESSOR) 100 MG tablet Take 100 mg by mouth 2 (two) times daily.      . minoxidil (LONITEN) 2.5 MG tablet Take 1 tablet (2.5 mg total) by mouth 2 (two) times daily.  60 tablet  5  . Multiple Vitamin (MULTIVITAMIN) capsule Take 1 capsule by mouth daily.        . nitroGLYCERIN (NITROSTAT) 0.4 MG SL tablet Place 1 tablet (0.4 mg total) under the tongue every 5 (five) minutes as needed for chest pain.  25 tablet  11  . polyethylene glycol (MIRALAX / GLYCOLAX) packet Take 17 g by mouth daily.      . potassium chloride (KLOR-CON 10) 10 MEQ tablet Take 1 tablet (10 mEq total) by mouth 2 (two) times daily.  90 tablet  3  . pravastatin (PRAVACHOL) 20 MG tablet Take 10 mg by mouth at bedtime.        . tacrolimus (PROGRAF) 1 MG capsule Take 9 mg by mouth 2 (two) times daily.       . timolol (TIMOPTIC) 0.25 % ophthalmic solution Place 1 drop into the right eye 2 (two) times daily.       . traMADol (ULTRAM) 50 MG tablet TAKE ONE TABLET BY MOUTH EVERY 8 HOURS AS NEEDED FOR PAIN  90 tablet  2   No current facility-administered medications for this encounter.     REVIEW OF SYSTEMS:  Pertinent items are  noted in HPI.    PHYSICAL EXAM: Alert and oriented 62 year old white female appearing her stated age. Wt Readings from Last 3 Encounters:  10/14/12 191 lb (86.637 kg)  10/14/12 191 lb 6.4 oz (86.818 kg)  10/08/12 191 lb 8 oz (86.864 kg)   Temp Readings from Last 3 Encounters:  10/14/12 97.7 F (36.5 C)   10/14/12 97.7 F (36.5 C) Oral  10/08/12 97.9 F (36.6 C) Oral   BP Readings from Last 3 Encounters:  10/14/12 157/52  10/14/12 157/52  10/08/12 120/80  Pulse Readings from Last 3 Encounters:  10/14/12 71  10/14/12 71  10/08/12 78   Head and neck examination: Remarkable for left ptosis. 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 Nodes: Without palpable cervical, supraclavicular, or axillary lymphadenopathy. Chest: Lungs clear. Heart: Grade 2/6 systolic ejection murmur. Regular rate and rhythm. Back: Without spinal or CVA tenderness. There is a palpable 2.5 cm mass along the inframammary region at 7:00 with surrounding ecchymosis. No other masses are appreciated in the right breast or left breast. Abdomen: Without hepatomegaly. Extremities: Without edema.    LABORATORY DATA:  Lab Results  Component Value Date   WBC 6.9 10/14/2012   HGB 12.0 10/14/2012   HCT 36.9 10/14/2012   MCV 89.7 10/14/2012   PLT 196 10/14/2012   Lab Results  Component Value Date   NA 140 10/14/2012   K 4.1 10/14/2012   CL 98 05/04/2012   CO2 31* 10/14/2012   Lab Results  Component Value Date   ALT 12 10/14/2012   AST 25 10/14/2012   ALKPHOS 42 10/14/2012   BILITOT 0.28 10/14/2012      IMPRESSION: The clinical stage IIA (T2 N0 M0) invasive ductal/DCIS of the left breast. Her management is complicated because of her medical comorbidities. Triple negative disease, she certainly a candidate for adjuvant chemotherapy. After being seen by Dr. Jamey Ripa, he feels that he may be able to remove this without neoadjuvant chemotherapy. A sentinel lymph node biopsy will also be performed. She is a candidate for breast  preservation. We discussed the potential acute and late toxicities of radiation therapy.   PLAN: As discussed above.  I spent 40 minutes minutes face to face with the patient and more than 50% of that time was spent in counseling and/or coordination of care.

## 2012-10-14 NOTE — Patient Instructions (Signed)
We will call you to schedule him for surgery once I have heard from your cardiologist about whether any cardiac tests need to be done preoperatively. We will schedule a surgery to remove your right breast cancer to be done as an outpatient.  If you have not heard from [place] 2 days after your cardiology visit please give [place] a call at [phone]

## 2012-10-14 NOTE — Addendum Note (Signed)
Addended by: Currie Paris on: 10/14/2012 11:21 AM   Modules accepted: Orders

## 2012-10-14 NOTE — Progress Notes (Signed)
Checked in new pt with no financial concerns. °

## 2012-10-14 NOTE — Progress Notes (Signed)
Patient ID: Monica Lee, female   DOB: 05/19/1950, 62 y.o.   MRN: [id number]  Chief Complaint  Patient presents with  . Breast Cancer    Right    HPI Monica Lee is a 62 y.o. female.  She recently found a mass in the right breast near the inframammary fold. This has been evaluated with mammogram and ultrasound followed by a needle core biopsy. 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 She did have a prior breast biopsy many years ago for benign disease. She has multiple medical problems as outlined her past medical history. She is scheduled for a cardiology evaluation in about 5 days. She is not having any breast symptoms and has had minimal discomfort since the biopsy.  HPI  Past Medical History  Diagnosis Date  . CHF (congestive heart failure)   . Diabetes mellitus   . Acid reflux   . S/P kidney transplant   . CAD (coronary artery disease)     native vessel  . GERD (gastroesophageal reflux disease)   . Diastolic dysfunction   . Hyperlipidemia   . DJD (degenerative joint disease)   . TIA (transient ischemic attack)   . Hx of colonic polyps   . PUD (peptic ulcer disease)   . Diabetic retinopathy   . Heart murmur   . Hemorrhoids   . Generalized headaches   . Hypertension   . Asthma   . MI (myocardial infarction) 2011  . Kidney Strebel   . Nephrolithiasis   . Mental disorder   . Stroke 2008    no deficits  . Anemia     Hx-not current  . Breast cancer     Past Surgical History  Procedure Laterality Date  . Transplantation renal  05/2006  . Cardiac catheterization  2011  . Abdominal hysterectomy    . Cataract extraction  bil  . Intestinal perforation surgery  01/2009  . Breast biopsy  2010  . Gallstones removed    . Cholecystectomy  2008  . Colon surgery  2013    polyps removed  . Hemorrhoid surgery  01/23/2012    Procedure: HEMORRHOIDECTOMY PROLAPSED;  Surgeon: Alicia Thomas, MD;  Location: [facility];  Service: General;   Laterality: N/A;  . Eye surgery Left 1997    Family History  Problem Relation Age of Onset  . Kidney disease Neg Hx   . Arthritis Other     parent  . Heart disease Other     parent  . Hypertension Other     parent  . Diabetes Other     parent  . Diabetes Other     grandparent  . Thyroid disease Other     several  . Heart disease Father     Social History History  Substance Use Topics  . Smoking status: Never Smoker   . Smokeless tobacco: Never Used  . Alcohol Use: No    Allergies  Allergen Reactions  . Cephalexin     REACTION: edema/face  . Propoxyphene-Acetaminophen     Current Outpatient Prescriptions  Medication Sig Dispense Refill  . albuterol (ACCUNEB) 0.63 MG/3ML nebulizer solution Take 3 mLs (0.63 mg total) by nebulization every 6 (six) hours as needed for wheezing.  75 mL  12  . albuterol (PROVENTIL HFA;VENTOLIN HFA) 108 (90 BASE) MCG/ACT inhaler Inhale 2 puffs into the lungs every 6 (six) hours as needed for wheezing.      . amLODipine (NORVASC) 10 MG tablet Take 1 tablet (10   mg total) by mouth daily.  90 tablet  3  . aspirin EC 325 MG tablet Take 325 mg by mouth daily.        . azaTHIOprine (IMURAN) 50 MG tablet Take 50 mg by mouth 2 (two) times daily.        . brimonidine (ALPHAGAN) 0.2 % ophthalmic solution Place 1 drop into both eyes 2 (two) times daily.        . brimonidine-timolol (COMBIGAN) 0.2-0.5 % ophthalmic solution Place 1 drop into both eyes 2 (two) times daily.        . cinacalcet (SENSIPAR) 30 MG tablet Take 1 tablet (30 mg total) by mouth daily.  30 tablet  11  . docusate sodium (COLACE) 100 MG capsule Take 1 capsule (100 mg total) by mouth 2 (two) times daily.  60 capsule  0  . esomeprazole (NEXIUM) 40 MG capsule Take 40 mg by mouth daily before breakfast.      . furosemide (LASIX) 80 MG tablet Take 160 mg by mouth 2 (two) times daily.       . insulin aspart (NOVOLOG) 100 UNIT/ML injection Inject 15 Units into the skin 3 (three) times daily  before meals.      . insulin glargine (LANTUS) 100 UNIT/ML injection Inject 0.6 mLs (60 Units total) into the skin daily.  30 mL  12  . isosorbide mononitrate (IMDUR) 60 MG 24 hr tablet TAKE ONE TABLET BY MOUTH EVERY DAY  90 tablet  1  . Magnesium Oxide 250 MG TABS Take 1 tablet by mouth daily.       . metoprolol (LOPRESSOR) 100 MG tablet Take 100 mg by mouth 2 (two) times daily.      . minoxidil (LONITEN) 2.5 MG tablet Take 1 tablet (2.5 mg total) by mouth 2 (two) times daily.  60 tablet  5  . Multiple Vitamin (MULTIVITAMIN) capsule Take 1 capsule by mouth daily.        . nitroGLYCERIN (NITROSTAT) 0.4 MG SL tablet Place 1 tablet (0.4 mg total) under the tongue every 5 (five) minutes as needed for chest pain.  25 tablet  11  . polyethylene glycol (MIRALAX / GLYCOLAX) packet Take 17 g by mouth daily.      . potassium chloride (KLOR-CON 10) 10 MEQ tablet Take 1 tablet (10 mEq total) by mouth 2 (two) times daily.  90 tablet  3  . pravastatin (PRAVACHOL) 20 MG tablet Take 10 mg by mouth at bedtime.        . tacrolimus (PROGRAF) 1 MG capsule Take 9 mg by mouth 2 (two) times daily.       . timolol (TIMOPTIC) 0.25 % ophthalmic solution Place 1 drop into the right eye 2 (two) times daily.       . traMADol (ULTRAM) 50 MG tablet TAKE ONE TABLET BY MOUTH EVERY 8 HOURS AS NEEDED FOR PAIN  90 tablet  2   No current facility-administered medications for this visit.    Review of Systems Review of Systems  Constitutional: Negative for fever, chills and unexpected weight change.  HENT: Negative for hearing loss, congestion, sore throat, trouble swallowing and voice change.        Needs glasses, blind OS  Eyes: Negative for visual disturbance.  Respiratory: Negative for cough and wheezing.   Cardiovascular: Negative for chest pain, palpitations and leg swelling.  Gastrointestinal: Negative for nausea, vomiting, abdominal pain, diarrhea, constipation, blood in stool, abdominal distention and anal bleeding.    Genitourinary: Negative for   hematuria, vaginal bleeding and difficulty urinating.  Musculoskeletal: Negative for arthralgias.  Skin: Negative for rash and wound.  Neurological: Negative for seizures, syncope and headaches.  Hematological: Negative for adenopathy. Bruises/bleeds easily.  Psychiatric/Behavioral: Negative for confusion.    Blood pressure 157/52, pulse 71, temperature 97.7 F (36.5 C), resp. rate 20, height 5' 4.5" (1.638 m), weight 191 lb (86.637 kg).  Physical Exam Physical Exam  Eyes:  Appears blind OS  Pulmonary/Chest:    Breasts are negative except as noted in graphic  Lymphadenopathy:    She has no cervical adenopathy.    She has no axillary adenopathy.       Right: No supraclavicular adenopathy present.       Left: No supraclavicular adenopathy present.    Data Reviewed I reviewed the mammogram and ultrasound films and reports and discuss them with her radiologist, I have reviewed the pathology slides and reports and discuss them with the pathologist. I have reviewed her situation with the medical and radiation oncologist.  Assessment    Clinical stage II right breast cancer her inframammary fold, 6:00 position, triple negative     Plan    I have recommended that she undergo lumpectomy. I discussed the risks and complications. I also discussed sentinel lymph node biopsy but I am awaiting input from the medical oncologist about whether or she'll receive any chemotherapy, given her multiple medical comorbidities including insulin-dependent diabetes somewhat poorly controlled, history of renal transplant, and history of coronary artery disease. She does have a cardiology visit scheduled and we may have further information after that is done as to whether she needs any cardiac workup prior to surgery. Currently our plans are going to be for a lumpectomy and Sentinel node dissection. She is anxious to be treated aggressively so she may elect to have postoperative  chemotherapy. We discussed the alternative of neoadjuvant chemotherapy with the medical oncologist but is not 100% certain that she will do chemotherapy and this will depend on the final pathology reports and the final decision by the patient. Therefore we will not plan to put a port in the time of surgery and proceed with a lumpectomy sentinel node.  I have explained the pathophysiology and staging of breast cancer with particular attention to her exact situation. We discussed the multidisciplinary approach to breast cancer which often includes both medical and radiation oncology consultations.  We also discussed surgical options for the treatment of breast cancer including lumpectomy and mastectomy with possible reconstructive surgery. In addition we talked about the evaluation and management of lymph nodes including a description of sentinel lymph node biopsy and axillary dissections. We reviewed potential complications and risks including bleeding, infection, numbness,  lymphedema, and the potential need for additional surgery.  She understands that for patients who are candidate for lumpectomy or mastectomy there is an equal survival rate with either technique, but a slightly higher local recurrence rate with lumpectomy. In addition she knows that a lumpectomy usually requires postoperative radiation as part of the management of the breast cancer.  We have discussed the likely postoperative course and plans for followup.  I have given the patient some written information that reviewed all of these issues. I believe her questions are answered and that she has a good understanding of the issues.          Emauri Krygier J 10/14/2012, 10:25 AM    

## 2012-10-19 ENCOUNTER — Encounter: Payer: Self-pay | Admitting: Cardiovascular Disease

## 2012-10-19 ENCOUNTER — Ambulatory Visit (INDEPENDENT_AMBULATORY_CARE_PROVIDER_SITE_OTHER): Payer: Medicare Other | Admitting: Cardiovascular Disease

## 2012-10-19 ENCOUNTER — Encounter: Payer: Self-pay | Admitting: *Deleted

## 2012-10-19 VITALS — BP 140/60 | HR 75 | Wt 191.0 lb

## 2012-10-19 DIAGNOSIS — I214 Non-ST elevation (NSTEMI) myocardial infarction: Secondary | ICD-10-CM

## 2012-10-19 DIAGNOSIS — E785 Hyperlipidemia, unspecified: Secondary | ICD-10-CM

## 2012-10-19 DIAGNOSIS — I2510 Atherosclerotic heart disease of native coronary artery without angina pectoris: Secondary | ICD-10-CM | POA: Diagnosis not present

## 2012-10-19 DIAGNOSIS — I252 Old myocardial infarction: Secondary | ICD-10-CM

## 2012-10-19 DIAGNOSIS — I5032 Chronic diastolic (congestive) heart failure: Secondary | ICD-10-CM

## 2012-10-19 DIAGNOSIS — E1159 Type 2 diabetes mellitus with other circulatory complications: Secondary | ICD-10-CM

## 2012-10-19 DIAGNOSIS — I10 Essential (primary) hypertension: Secondary | ICD-10-CM

## 2012-10-19 NOTE — Assessment & Plan Note (Signed)
Discussed low carb diet.  Target hemoglobin A1c is 6.5 or less.  Continue current medications.  

## 2012-10-19 NOTE — Patient Instructions (Addendum)
Your physician has requested that you have a lexiscan myoview. For further information please visit [email or web address] Please follow instruction sheet, as given.  Your physician has requested that you have a cardiac MRI. Cardiac MRI uses a computer to create images of your heart as its beating, producing both still and moving pictures of your heart and major blood vessels. For further information please visit [email or web address]. Please follow the instruction sheet given to you today for more information.  Your physician wants you to follow-up in: 1 year  You will receive a reminder letter in the mail two months in advance. If you don't receive a letter, please call our office to schedule the follow-up appointment.

## 2012-10-19 NOTE — Assessment & Plan Note (Signed)
Cholesterol is at goal.  Continue current dose of statin and diet Rx.  No myalgias or side effects.  F/U  LFT's in 6 months. Lab Results  Component Value Date   LDLCALC 68 09/04/2010

## 2012-10-19 NOTE — Progress Notes (Signed)
[facility] Psychosocial Distress Screening Clinical Social Work  Patient completed distress screening protocol, and scored a 6 on the Psychosocial Distress Thermometer which indicates moderate distress. Clinical Social Worker met with pt in [facility] to assess for distress and other psychosocial needs.  Pt stated she felt "better" after speaking with the physicians and having information on her treatment plan, but did acknowledge the emotion aspects of her diagnosis.  CSW informed pt of the support team and support services at [facility], and pt was agreeable to an [facility] referral.  CSw provided support calendar and information, and encouraged pt to call with any questions or concerns.    Tamala Julian, MSW, LCSW Clinical Social Worker [facility] [phone]

## 2012-10-19 NOTE — Assessment & Plan Note (Signed)
Well controlled.  Continue current medications and low sodium Dash type diet.    

## 2012-10-19 NOTE — Progress Notes (Addendum)
Patient ID: Monica Lee, female   DOB: 11/01/1950, 62 y.o.   MRN: [id number]     62 yo with history of HTN, renal transplant, Iodine radiation for thyroid disease and diastolic dyspfunciton Referred by Dr Karie Kirks for evaluation preop right breast cancer. Has stage T2N0 disease.  Cath a few years ago in [place] showed not CAD  Echo from [place] ? EF 50-55% None in EPIC systom.  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  A1c in August was over 9  Now seeing endocrinologist.  No documented CAD  She is overweight with poor diet and dependant edema No PND or orthopnea.  She will need general anesthesia for her breast surgery.    ROS: Denies fever, malais, weight loss, blurry vision, decreased visual acuity, cough, sputum, SOB, hemoptysis, pleuritic pain, palpitaitons, heartburn, abdominal pain, melena, lower extremity edema, claudication, or rash.  All other systems reviewed and negative   General: Affect appropriate Overweight black female HEENT: normal Neck supple with no adenopathy JVP normal no bruits no thyromegaly Lungs clear with no wheezing and good diaphragmatic motion Heart:  S1/S2 no murmur,rub, gallop or click PMI normal Abdomen: benighn, BS positve, no tenderness, no AAA no bruit.  No HSM or HJR Distal pulses intact with no bruits No edema Neuro non-focal Skin warm and dry No muscular weakness  Medications Current Outpatient Prescriptions  Medication Sig Dispense Refill  . albuterol (ACCUNEB) 0.63 MG/3ML nebulizer solution Take 3 mLs (0.63 mg total) by nebulization every 6 (six) hours as needed for wheezing.  75 mL  12  . albuterol (PROVENTIL HFA;VENTOLIN HFA) 108 (90 BASE) MCG/ACT inhaler Inhale 2 puffs into the lungs every 6 (six) hours as needed for wheezing.      Marland Kitchen amLODipine (NORVASC) 10 MG tablet Take 1 tablet (10 mg total) by mouth daily.  90 tablet  3  . aspirin EC 325 MG tablet Take 325 mg by mouth daily.        Marland Kitchen  azaTHIOprine (IMURAN) 50 MG tablet Take 50 mg by mouth 2 (two) times daily.        . brimonidine (ALPHAGAN) 0.2 % ophthalmic solution Place 1 drop into both eyes 2 (two) times daily.        . brimonidine-timolol (COMBIGAN) 0.2-0.5 % ophthalmic solution Place 1 drop into both eyes 2 (two) times daily.        . cinacalcet (SENSIPAR) 30 MG tablet Take 1 tablet (30 mg total) by mouth daily.  30 tablet  11  . docusate sodium (COLACE) 100 MG capsule Take 1 capsule (100 mg total) by mouth 2 (two) times daily.  60 capsule  0  . esomeprazole (NEXIUM) 40 MG capsule Take 40 mg by mouth daily before breakfast.      . furosemide (LASIX) 80 MG tablet Take 160 mg by mouth 2 (two) times daily.       . insulin aspart (NOVOLOG) 100 UNIT/ML injection Inject 15 Units into the skin 3 (three) times daily before meals.      . insulin glargine (LANTUS) 100 UNIT/ML injection Inject 0.6 mLs (60 Units total) into the skin daily.  30 mL  12  . isosorbide mononitrate (IMDUR) 60 MG 24 hr tablet TAKE ONE TABLET BY MOUTH EVERY DAY  90 tablet  1  . Magnesium Oxide 250 MG TABS Take 1 tablet by mouth daily.       . metoprolol (LOPRESSOR) 100 MG tablet Take 100 mg by mouth  2 (two) times daily.      . minoxidil (LONITEN) 2.5 MG tablet Take 1 tablet (2.5 mg total) by mouth 2 (two) times daily.  60 tablet  5  . Multiple Vitamin (MULTIVITAMIN) capsule Take 1 capsule by mouth daily.        . nitroGLYCERIN (NITROSTAT) 0.4 MG SL tablet Place 1 tablet (0.4 mg total) under the tongue every 5 (five) minutes as needed for chest pain.  25 tablet  11  . polyethylene glycol (MIRALAX / GLYCOLAX) packet Take 17 g by mouth daily.      . potassium chloride (KLOR-CON 10) 10 MEQ tablet Take 1 tablet (10 mEq total) by mouth 2 (two) times daily.  90 tablet  3  . pravastatin (PRAVACHOL) 20 MG tablet Take 10 mg by mouth at bedtime.        . tacrolimus (PROGRAF) 1 MG capsule Take 9 mg by mouth 2 (two) times daily.       . timolol (TIMOPTIC) 0.25 %  ophthalmic solution Place 1 drop into the right eye 2 (two) times daily.       . traMADol (ULTRAM) 50 MG tablet TAKE ONE TABLET BY MOUTH EVERY 8 HOURS AS NEEDED FOR PAIN  90 tablet  2   No current facility-administered medications for this visit.    Allergies Cephalexin and Propoxyphene-acetaminophen  Family History: Family History  Problem Relation Age of Onset  . Kidney disease Neg Hx   . Arthritis Other     parent  . Heart disease Other     parent  . Hypertension Other     parent  . Diabetes Other     parent  . Diabetes Other     grandparent  . Thyroid disease Other     several  . Heart disease Father     Social History: History   Social History  . Marital Status: Legally Separated    Spouse Name: N/A    Number of Children: 4  . Years of Education: N/A   Occupational History  . Disables/[employer] since 2002    Social History Main Topics  . Smoking status: Never Smoker   . Smokeless tobacco: Never Used  . Alcohol Use: No  . Drug Use: No  . Sexual Activity: Not on file   Other Topics Concern  . Not on file   Social History Narrative  . No narrative on file    Electrocardiogram: 09/30/11  SR rate 64  Inferolateral T wave changes  Today SR rate 75 LVH with strain with persistant inferolateral T wave inversions compared to 2013  Assessment and Plan

## 2012-10-19 NOTE — Assessment & Plan Note (Signed)
Body habitus makes echo paramaters difficult to measure.  Cardiac MRI with strain analysis for quantitative EF  Not likely to get herceptin if HERS negative

## 2012-10-19 NOTE — Assessment & Plan Note (Signed)
Etiology of patients previous "MI's" not clear as she has no documented epicardial CAD  Needs lexiscan myovue to clear for surgery.

## 2012-10-20 ENCOUNTER — Telehealth: Payer: Self-pay

## 2012-10-20 DIAGNOSIS — Z940 Kidney transplant status: Secondary | ICD-10-CM | POA: Diagnosis not present

## 2012-10-20 DIAGNOSIS — E213 Hyperparathyroidism, unspecified: Secondary | ICD-10-CM | POA: Diagnosis not present

## 2012-10-20 DIAGNOSIS — E119 Type 2 diabetes mellitus without complications: Secondary | ICD-10-CM | POA: Diagnosis not present

## 2012-10-20 DIAGNOSIS — I10 Essential (primary) hypertension: Secondary | ICD-10-CM | POA: Diagnosis not present

## 2012-10-20 DIAGNOSIS — E669 Obesity, unspecified: Secondary | ICD-10-CM | POA: Diagnosis not present

## 2012-10-20 DIAGNOSIS — E785 Hyperlipidemia, unspecified: Secondary | ICD-10-CM | POA: Diagnosis not present

## 2012-10-20 DIAGNOSIS — N2581 Secondary hyperparathyroidism of renal origin: Secondary | ICD-10-CM | POA: Diagnosis not present

## 2012-10-20 MED ORDER — ALBUTEROL SULFATE 0.63 MG/3ML IN NEBU: 1.0000 | INHALATION_SOLUTION | Freq: Four times a day (QID) | RESPIRATORY_TRACT | Status: DC | PRN

## 2012-10-20 NOTE — Telephone Encounter (Signed)
refills  

## 2012-10-22 ENCOUNTER — Other Ambulatory Visit: Payer: Self-pay

## 2012-10-22 MED ORDER — ALBUTEROL SULFATE 0.63 MG/3ML IN NEBU: 1.0000 | INHALATION_SOLUTION | Freq: Four times a day (QID) | RESPIRATORY_TRACT | Status: DC | PRN

## 2012-10-28 ENCOUNTER — Ambulatory Visit (HOSPITAL_COMMUNITY)
Admission: RE | Admit: 2012-10-28 | Discharge: 2012-10-28 | Disposition: A | Discharge status: Home / Self Care | Payer: Medicare Other | Source: Ambulatory Visit | Attending: Cardiovascular Disease | Admitting: Cardiovascular Disease

## 2012-10-28 ENCOUNTER — Other Ambulatory Visit: Payer: Self-pay | Admitting: Cardiovascular Disease

## 2012-10-28 DIAGNOSIS — I214 Non-ST elevation (NSTEMI) myocardial infarction: Secondary | ICD-10-CM

## 2012-10-28 DIAGNOSIS — I517 Cardiomegaly: Secondary | ICD-10-CM | POA: Insufficient documentation

## 2012-10-28 DIAGNOSIS — I428 Other cardiomyopathies: Secondary | ICD-10-CM | POA: Insufficient documentation

## 2012-10-28 DIAGNOSIS — Z940 Kidney transplant status: Secondary | ICD-10-CM | POA: Insufficient documentation

## 2012-10-28 DIAGNOSIS — I2510 Atherosclerotic heart disease of native coronary artery without angina pectoris: Secondary | ICD-10-CM

## 2012-10-28 DIAGNOSIS — I319 Disease of pericardium, unspecified: Secondary | ICD-10-CM | POA: Diagnosis not present

## 2012-10-28 LAB — CREATININE, SERUM: GFR calc Af Amer: 46 mL/min — ABNORMAL LOW (ref >90)

## 2012-10-28 MED ORDER — DIAZEPAM 5 MG PO TABS
5.0000 mg | ORAL_TABLET | Freq: Once | ORAL | Status: AC
  Administered 2012-10-28: 5 mg via ORAL
  Filled 2012-10-28: qty 1

## 2012-10-28 MED ORDER — DIAZEPAM 5 MG PO TABS
ORAL_TABLET | ORAL | Status: AC
  Administered 2012-10-28: 5 mg via ORAL
  Filled 2012-10-28: qty 1

## 2012-10-30 ENCOUNTER — Other Ambulatory Visit: Payer: Self-pay | Admitting: Internal Medicine

## 2012-11-02 NOTE — Progress Notes (Signed)
Monica Lee [id number] 05-May-1950 62 y.o. 11/02/2012 1:36 PM  CC  Oliver Barre, MD [address] [place] [address] Dr. Chipper Herb Dr. 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 Patient was seen in the multidisciplinary breast clinic for discussion of treatment options.  STAGE:   Breast cancer of lower-outer quadrant of right female breast   Primary site: Breast (Right)   Staging method: AJCC 7th Edition   Clinical: Stage IIA (T2, N0, cM0)   Summary: Stage IIA (T2, N0, cM0)  REFERRING PHYSICIAN: Dr. Cyndia Bent  HISTORY OF PRESENT ILLNESS:  Monica Lee is a 62 y.o. female.  Who felt a mass along the inferior aspect of the right breast at about the 7:00 position. On 10/08/2012 she had a mammogram performed that showed a irregular mass with internal pleomorphic calcifications. Ultrasound showed a suspicious 2.1 x 1.3 x 1.6 cm mass in the inframammary fold. A needle core biopsy performed on the same day was diagnostic for invasive ductal carcinoma with associated DCIS. The tumor was ER negative PR negative and HER-2/neu negative. The proliferation marker Ki-67 was elevated at 50% and the tumor was grade 2. The patient's pathology radiology was reviewed at the multidisciplinary breast conference. Her case was fully discussed and a plan of care was made. She is now seen in the multidisciplinary breast clinic for further discussion of treatment options. She was also seen by Dr. Chipper Herb and Dr. Cyndia Bent.   Past Medical History: Past Medical History  Diagnosis Date  . CHF (congestive heart failure)   . Diabetes mellitus   . Acid reflux   . S/P kidney transplant   . CAD (coronary artery disease)     native vessel  . GERD (gastroesophageal reflux disease)   . Diastolic dysfunction   . Hyperlipidemia   . DJD (degenerative joint disease)   . TIA  (transient ischemic attack)   . Hx of colonic polyps   . PUD (peptic ulcer disease)   . Diabetic retinopathy   . Heart murmur   . Hemorrhoids   . Generalized headaches   . Hypertension   . Asthma   . MI (myocardial infarction) 2011  . Kidney Pirani   . Nephrolithiasis   . Mental disorder   . Stroke 2008    no deficits  . Anemia     Hx-not current  . Breast cancer     Past Surgical History: Past Surgical History  Procedure Laterality Date  . Transplantation renal  05/2006  . Cardiac catheterization  2011  . Abdominal hysterectomy    . Cataract extraction  bil  . Intestinal perforation surgery  01/2009  . Breast biopsy  2010  . Gallstones removed    . Cholecystectomy  2008  . Colon surgery  2013    polyps removed  . Hemorrhoid surgery  01/23/2012    Procedure: HEMORRHOIDECTOMY PROLAPSED;  Surgeon: Romie Levee, MD;  Location: [facility];  Service: General;  Laterality: N/A;  . Eye surgery Left 1997    Family History: Family History  Problem Relation Age of Onset  . Kidney disease Neg Hx   . Arthritis Other     parent  . Heart disease Other     parent  . Hypertension Other     parent  . Diabetes Other     parent  . Diabetes Other  grandparent  . Thyroid disease Other     several  . Heart disease Father     Social History History  Substance Use Topics  . Smoking status: Never Smoker   . Smokeless tobacco: Never Used  . Alcohol Use: No    Allergies: Allergies  Allergen Reactions  . Cephalexin     REACTION: edema/face  . Propoxyphene-Acetaminophen     Current Medications: Current Outpatient Prescriptions  Medication Sig Dispense Refill  . albuterol (PROVENTIL HFA;VENTOLIN HFA) 108 (90 BASE) MCG/ACT inhaler Inhale 2 puffs into the lungs every 6 (six) hours as needed for wheezing.      Marland Kitchen amLODipine (NORVASC) 10 MG tablet Take 1 tablet (10 mg total) by mouth daily.  90 tablet  3  . aspirin EC 325 MG tablet Take 325 mg by mouth daily.         Marland Kitchen azaTHIOprine (IMURAN) 50 MG tablet Take 50 mg by mouth 2 (two) times daily.        . brimonidine (ALPHAGAN) 0.2 % ophthalmic solution Place 1 drop into both eyes 2 (two) times daily.        . brimonidine-timolol (COMBIGAN) 0.2-0.5 % ophthalmic solution Place 1 drop into both eyes 2 (two) times daily.        . cinacalcet (SENSIPAR) 30 MG tablet Take 1 tablet (30 mg total) by mouth daily.  30 tablet  11  . docusate sodium (COLACE) 100 MG capsule Take 1 capsule (100 mg total) by mouth 2 (two) times daily.  60 capsule  0  . esomeprazole (NEXIUM) 40 MG capsule Take 40 mg by mouth daily before breakfast.      . furosemide (LASIX) 80 MG tablet Take 160 mg by mouth 2 (two) times daily.       . insulin aspart (NOVOLOG) 100 UNIT/ML injection Inject 15 Units into the skin 3 (three) times daily before meals.      . insulin glargine (LANTUS) 100 UNIT/ML injection Inject 0.6 mLs (60 Units total) into the skin daily.  30 mL  12  . isosorbide mononitrate (IMDUR) 60 MG 24 hr tablet TAKE ONE TABLET BY MOUTH EVERY DAY  90 tablet  1  . Magnesium Oxide 250 MG TABS Take 1 tablet by mouth daily.       . metoprolol (LOPRESSOR) 100 MG tablet Take 100 mg by mouth 2 (two) times daily.      . minoxidil (LONITEN) 2.5 MG tablet Take 1 tablet (2.5 mg total) by mouth 2 (two) times daily.  60 tablet  5  . Multiple Vitamin (MULTIVITAMIN) capsule Take 1 capsule by mouth daily.        . polyethylene glycol (MIRALAX / GLYCOLAX) packet Take 17 g by mouth daily.      . potassium chloride (KLOR-CON 10) 10 MEQ tablet Take 1 tablet (10 mEq total) by mouth 2 (two) times daily.  90 tablet  3  . pravastatin (PRAVACHOL) 20 MG tablet Take 10 mg by mouth at bedtime.        . tacrolimus (PROGRAF) 1 MG capsule Take 9 mg by mouth 2 (two) times daily.       . timolol (TIMOPTIC) 0.25 % ophthalmic solution Place 1 drop into the right eye 2 (two) times daily.       . traMADol (ULTRAM) 50 MG tablet TAKE ONE TABLET BY MOUTH EVERY 8 HOURS AS  NEEDED FOR PAIN  90 tablet  2  . albuterol (ACCUNEB) 0.63 MG/3ML nebulizer solution Take 3 mLs (0.63  mg total) by nebulization every 6 (six) hours as needed for wheezing.  75 mL  12  . nitroGLYCERIN (NITROSTAT) 0.4 MG SL tablet Place 1 tablet (0.4 mg total) under the tongue every 5 (five) minutes as needed for chest pain.  25 tablet  11  . predniSONE (DELTASONE) 10 MG tablet TAKE 1 TABLET BY MOUTH EVERY DAY  90 tablet  3   No current facility-administered medications for this visit.    OB/GYN History:menarche at age 64 she is postmenopausal she has never been on hormone replacement therapy first live birth was at 11.  Fertility Discussion: not applicable Prior History of Cancer: no prior history of malignancies  Health Maintenance:  Colonoscopy yes 2013 Bone Density yes date unknown Last PAP smear unknown  ECOG PERFORMANCE STATUS: 1 - Symptomatic but completely ambulatory  Genetic Counseling/testing: no  REVIEW OF SYSTEMS:  The 14 point comprehensive review of system was obtained and it is scant separately into the electronic medical record  PHYSICAL EXAMINATION: Blood pressure 157/52, pulse 71, temperature 97.7 F (36.5 C), temperature source Oral, resp. rate 20, height 5' 4.5" (1.638 m), weight 191 lb 6.4 oz (86.818 kg).  ZOX:WRUEA, healthy, no distress, well nourished and well developed SKIN: skin color, texture, turgor are normal HEAD: Normocephalic EYES: PERRLA, EOMI, Conjunctiva are pink and non-injected EARS: External ears normal OROPHARYNX:no exudate and no erythema  NECK: supple, no adenopathy LYMPH:  no palpable lymphadenopathy, no hepatosplenomegaly BREAST:right breast normal without mass, skin or nipple changes or axillary nodes, abnormal mass palpable in the left breast with some area of ecchymosis due to the recent biopsy LUNGS: clear to auscultation and percussion HEART: regular rate & rhythm ABDOMEN:abdomen soft, non-tender, normal bowel sounds and no masses  or organomegaly BACK: No CVA tenderness EXTREMITIES:no edema, no clubbing, no cyanosis  NEURO: alert & oriented x 3 with fluent speech, no focal motor/sensory deficits, gait normal, reflexes normal and symmetric     STUDIES/RESULTS: US Breast Right  10/08/2012   CLINICAL DATA:  Palpable area of concern in the 7 o'clock region of the right breast.  EXAM: DIGITAL DIAGNOSTIC BILATERALMAMMOGRAM WITH CAD  ULTRASOUND RIGHT BREAST  COMPARISON:  WITH PRIORS  ACR Breast Density Category c: The breast tissue is heterogeneously dense.  FINDINGS: In the deep aspect of the lower outer quadrant of the right breast is an irregular mass with internal pleomorphic calcifications.  No mass, suspicious microcalcification, or distortion is identified in the left breast to suggest malignancy.  Mammographic images were processed with CAD.  On physical exam there is a firm approximately 2.5 cm mass in the 7 o'clock position right breast approximately 9 cm from the nipple near the inframammary fold. No additional mass is palpated in the right breast. No axillary lymphadenopathy is palpated on the right.  Ultrasound is performed, showing an irregular heterogeneous and hypoechoic mass measuring approximately 2.1 x 1.3 x 1.6 cm. There is internal vascular flow. Small echogenicities within the mass are consistent with the suspicious calcifications seen on mammography.  Ultrasound the right axilla demonstrates a normal axillary lymph node. No lymphadenopathy is detected.  IMPRESSION: Suspicious 2.1 x 1.3 x 1.6 cm palpable mass 10 o'clock position right breast near the inframammary fold. Findings are suggestive of malignancy and ultrasound-guided biopsy is recommended and will be performed today and dictated separately.  RECOMMENDATION: Ultrasound-guided biopsy of a right breast mass.  I have discussed the findings and recommendations with the patient. Results were also provided in writing at the conclusion of the  visit. If applicable,  a reminder letter will be sent to the patient regarding the next appointment.  BI-RADS CATEGORY  5: Highly suggestive of malignancy - appropriate action should be taken.   Electronically Signed   By: Britta Mccreedy   On: 10/08/2012 16:45   Mm Digital Diagnostic Bilat  10/08/2012   CLINICAL DATA:  Palpable area of concern in the 7 o'clock region of the right breast.  EXAM: DIGITAL DIAGNOSTIC BILATERALMAMMOGRAM WITH CAD  ULTRASOUND RIGHT BREAST  COMPARISON:  WITH PRIORS  ACR Breast Density Category c: The breast tissue is heterogeneously dense.  FINDINGS: In the deep aspect of the lower outer quadrant of the right breast is an irregular mass with internal pleomorphic calcifications.  No mass, suspicious microcalcification, or distortion is identified in the left breast to suggest malignancy.  Mammographic images were processed with CAD.  On physical exam there is a firm approximately 2.5 cm mass in the 7 o'clock position right breast approximately 9 cm from the nipple near the inframammary fold. No additional mass is palpated in the right breast. No axillary lymphadenopathy is palpated on the right.  Ultrasound is performed, showing an irregular heterogeneous and hypoechoic mass measuring approximately 2.1 x 1.3 x 1.6 cm. There is internal vascular flow. Small echogenicities within the mass are consistent with the suspicious calcifications seen on mammography.  Ultrasound the right axilla demonstrates a normal axillary lymph node. No lymphadenopathy is detected.  IMPRESSION: Suspicious 2.1 x 1.3 x 1.6 cm palpable mass 10 o'clock position right breast near the inframammary fold. Findings are suggestive of malignancy and ultrasound-guided biopsy is recommended and will be performed today and dictated separately.  RECOMMENDATION: Ultrasound-guided biopsy of a right breast mass.  I have discussed the findings and recommendations with the patient. Results were also provided in writing at the conclusion of the visit. If  applicable, a reminder letter will be sent to the patient regarding the next appointment.  BI-RADS CATEGORY  5: Highly suggestive of malignancy - appropriate action should be taken.   Electronically Signed   By: Britta Mccreedy   On: 10/08/2012 16:45   Mm Digital Diagnostic Unilat R  10/08/2012   CLINICAL DATA:  Ultrasound-guided biopsy of right breast mass was performed.  EXAM: DIGITAL DIAGNOSTIC UNILATERAL RIGHT MAMMOGRAM  COMPARISON:  Previous exams.  FINDINGS: Films are performed following ultrasound guided biopsy of probable some suspicious right breast mass at 7 o'clock position near the inframammary fold. A dumbbell shaped biopsy clip is in satisfactory position within the mass.  IMPRESSION: Satisfactory position of biopsy clip in the right breast mass.  Final Assessment: Post Procedure Mammograms for Marker Placement   Electronically Signed   By: Britta Mccreedy   On: 10/08/2012 16:48   Mr Card Morphology W/o Cm  10/29/2012   CLINICAL DATA:  Cardiomyopathy:  Assess EF  EXAM: CARDIAC MRI  TECHNIQUE: The patient was scanned on a 1.5 Tesla GE magnet. A dedicated cardiac coil was used. Functional imaging was done using Fiesta sequences. 2,3, and 4 chamber views were done to assess for RWMA's. Modified Simpson's rule using a short axis stack was used to calculate an ejection fraction on a dedicated work [employer]. No multihance was used due to the patient previous renal transplant.  CONTRAST:  None  FINDINGS: There was moderate to severel LVH with septal thickness 15mm. There was a moderate circumferential pericardial effusion. There was mild LAE Right sided cardiac chambers were normal with no diastolic flattening. The aortic valve  had some turbulence to flow in systole There was no significant MR or AR. The LV cavity size was small with prominent papillary muscles and a narrow LVOT. There was chordal SAM with no evidence of sub valvular obstruction.  There were no discrete RWMA;s  Quantitative EF was 55%(EDV 130 cc ESV 59 cc and SV 71cc) Global longitudinal strain was normal at 1 21.5%  IMPRESSION: 1) Moderate to severe LVH with normal EF 55% Small LV cavity size with chordal SAM but o LVOT gradient or true HOCM  2) Normal longitudinal strain -21.5%  3) Moderate circumferential pericardial effusion  4) Normal RV/RA  5) Mild LAE  No gadolinium given so no infarct imaging done due to patients history of renal transplant  Charlton Haws   Electronically Signed   By: Charlton Haws M.D.   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 This is concordant. The patient was notified of results the afternoon of 10/09/2012 by Dr. Jean Rosenthal. She has no complaints regarding her biopsy site. She is scheduled for multidisciplinary clinic on 10/14/2012. Due to the patient's renal transplant, no breast MRI is scheduled.   Electronically Signed   By: Jerene Dilling M.D.   On: 10/09/2012 16:46   10/09/2012   CLINICAL DATA:  Suspicious palpable mass 10 o'clock position right breast.  EXAM: US BREAST BX W LOC DEV 1ST LESION IMG BX SPEC US GUIDE*R*  COMPARISON:  Previous exams.  PROCEDURE: I met with the patient and we discussed the procedure of ultrasound-guided biopsy, including benefits and alternatives. We discussed the high likelihood of a successful procedure. We discussed the risks of the procedure including infection, bleeding, tissue injury, clip migration, and inadequate sampling. Informed written consent was given.  Using sterile technique and 2% Lidocaine as local anesthetic, under direct ultrasound visualization, a 12 gauge vacuum-assisteddevice was used to perform biopsy of an approximate 2.1 cm solid mass at the 7 o'clock position 9 cm from the nipple using a lateral to medial approach. At the conclusion of the procedure, a  dumbbell-shaped tissue marker clip was deployed into the biopsy cavity. Follow-up 2-view mammogram was performed and dictated separately.  The usual time-out protocol was performed immediately prior to the procedure.  IMPRESSION: Ultrasound-guided biopsy of right breast mass. No apparent complications.  Electronically Signed: By: Britta Mccreedy On: 10/08/2012 16:47     LABS:    Chemistry      Component Value Date/Time   NA 140 10/14/2012 0825   NA 140 05/04/2012 1311   K 4.1 10/14/2012 0825   K 3.8 05/04/2012 1311   CL 98 05/04/2012 1311   CO2 31* 10/14/2012 0825   CO2 33* 05/04/2012 1311   BUN 27.3* 10/14/2012 0825   BUN 26* 05/04/2012 1311   CREATININE 1.40* 10/28/2012 0933   CREATININE 1.9* 10/14/2012 0825      Component Value Date/Time   CALCIUM 9.0 10/14/2012 0825   CALCIUM 9.5 05/04/2012 1311   ALKPHOS 42 10/14/2012 0825   ALKPHOS 40 05/04/2012 1311   AST 25 10/14/2012 0825   AST 23 05/04/2012 1311   ALT 12 10/14/2012 0825   ALT 12 05/04/2012 1311   BILITOT 0.28 10/14/2012 0825   BILITOT 0.2* 05/04/2012 1311      Lab Results  Component Value Date   WBC 6.9 10/14/2012  HGB 12.0 10/14/2012   HCT 36.9 10/14/2012   MCV 89.7 10/14/2012   PLT 196 10/14/2012   PATHOLOGY: ADDITIONAL INFORMATION: PROGNOSTIC INDICATORS - ACIS Results: IMMUNOHISTOCHEMICAL AND MORPHOMETRIC ANALYSIS BY THE AUTOMATED CELLULAR IMAGING SYSTEM (ACIS) Estrogen Receptor: 0%, NEGATIVE Progesterone Receptor: 0%, NEGATIVE Proliferation Marker Ki67: 56% COMMENT: The negative hormone receptor study(ies) in this case have an internal positive control. REFERENCE RANGE ESTROGEN RECEPTOR NEGATIVE <1% POSITIVE =>1% PROGESTERONE RECEPTOR NEGATIVE <1% POSITIVE =>1% All controls stained appropriately Jimmy Picket MD Pathologist, Electronic Signature ( Signed 10/14/2012) CHROMOGENIC IN-SITU HYBRIDIZATION Results: HER-2/NEU BY CISH - NO AMPLIFICATION OF HER-2 DETECTED. RESULT RATIO OF HER2: CEP 17 SIGNALS 1.35 1 of  3 FINAL for Monica Lee, Monica Lee [id number]) ADDITIONAL INFORMATION:(continued) AVERAGE HER2 COPY NUMBER PER CELL 1.75 REFERENCE RANGE NEGATIVE HER2/Chr17 Ratio <2.0 and Average HER2 copy number <4.0 EQUIVOCAL HER2/Chr17 Ratio <2.0 and Average HER2 copy number 4.0 and <6.0 POSITIVE HER2/Chr17 Ratio >=2.0 and/or Average HER2 copy number >=6.0 Abigail Miyamoto MD Pathologist, Electronic Signature ( Signed 10/14/2012) FINAL DIAGNOSIS Diagnosis Breast, right, needle core biopsy, mass, 7 o'clock - INVASIVE DUCTAL CARCINOMA, SEE COMMENT. - DUCTAL CARCINOMA IN SITU. Microscopic Comment Although the grade of tumor is best assessed at resection, with the biopsies, the invasive carcinoma is grade III and in situ carcinoma grade II. Breast prognostic studies are pending and will be reported in an addendum. The case was reviewed by Dr. Raynald Blend who concurs. 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 The tumor is triple negative with an elevated proliferation marker Ki-67. Patient's case was discussed at the multidisciplinary breast conference. She is seen by Dr. Cyndia Bent. He does feel that patient could undergo lumpectomy with sentinel lymph node biopsy of front.  #2 patient and I discussed the pathophysiology of triple-negative breast cancer. We discussed adjuvant treatment options including chemotherapy. Of note patient is a renal transplant. Patient and certainly we will have to utilize chemotherapy without compromising her transplant/kidneys. She is on immunosuppressive therapy for her transplant.  Clinical Trial Eligibility: no Multidisciplinary conference discussion yes     PLAN:    #1 patient will proceed with a lumpectomy  and sentinel lymph node biopsy.  #2 we will discuss her case at the multidisciplinary breast conference again. Certainly she will need chemotherapy we certainly could use Taxotere Cytoxan versus possibly CMF.we did discuss potential acute and late side effects of chemotherapy.  #3 she will also need radiation therapy and she was seen by Dr. Chipper Herb who has recommended adjuvant radiation.        Discussion: Patient is being treated per NCCN breast cancer care guidelines appropriate for stage.II   Thank you so much for allowing me to participate in the care of Monica Lee. I will continue to follow up the patient with you and assist in her care.  All questions were answered. The patient knows to call the clinic with any problems, questions or concerns. We can certainly see the patient much sooner if necessary.    The length of time of the face-to-face encounter was 60    minutes. More than 50% of time was spent counseling and coordination of care.  Drue Second, MD Medical/Oncology [facility] [phone] (beeper) [phone] (Office)

## 2012-11-03 ENCOUNTER — Encounter: Payer: Self-pay | Admitting: *Deleted

## 2012-11-03 ENCOUNTER — Encounter (HOSPITAL_BASED_OUTPATIENT_CLINIC_OR_DEPARTMENT_OTHER): Payer: Self-pay | Admitting: *Deleted

## 2012-11-03 NOTE — Progress Notes (Signed)
Pt is a renal transplant with [facility]-sees dr Karyl Kinnier work up for clearance-seeing him 11/04/12 for echo-then clearance Will see

## 2012-11-04 ENCOUNTER — Other Ambulatory Visit: Payer: Self-pay | Admitting: Internal Medicine

## 2012-11-04 ENCOUNTER — Telehealth: Payer: Self-pay | Admitting: *Deleted

## 2012-11-04 ENCOUNTER — Ambulatory Visit (HOSPITAL_COMMUNITY): Payer: Medicare Other | Attending: Internal Medicine | Admitting: Radiology

## 2012-11-04 VITALS — BP 125/92 | HR 67 | Ht 65.0 in | Wt 193.0 lb

## 2012-11-04 DIAGNOSIS — I10 Essential (primary) hypertension: Secondary | ICD-10-CM | POA: Insufficient documentation

## 2012-11-04 DIAGNOSIS — Z8249 Family history of ischemic heart disease and other diseases of the circulatory system: Secondary | ICD-10-CM | POA: Insufficient documentation

## 2012-11-04 DIAGNOSIS — I259 Chronic ischemic heart disease, unspecified: Secondary | ICD-10-CM | POA: Insufficient documentation

## 2012-11-04 DIAGNOSIS — R9439 Abnormal result of other cardiovascular function study: Secondary | ICD-10-CM | POA: Diagnosis not present

## 2012-11-04 DIAGNOSIS — Z01810 Encounter for preprocedural cardiovascular examination: Secondary | ICD-10-CM

## 2012-11-04 DIAGNOSIS — R0602 Shortness of breath: Secondary | ICD-10-CM | POA: Insufficient documentation

## 2012-11-04 DIAGNOSIS — Z794 Long term (current) use of insulin: Secondary | ICD-10-CM | POA: Diagnosis not present

## 2012-11-04 DIAGNOSIS — E119 Type 2 diabetes mellitus without complications: Secondary | ICD-10-CM | POA: Insufficient documentation

## 2012-11-04 DIAGNOSIS — I252 Old myocardial infarction: Secondary | ICD-10-CM | POA: Diagnosis not present

## 2012-11-04 DIAGNOSIS — Z8673 Personal history of transient ischemic attack (TIA), and cerebral infarction without residual deficits: Secondary | ICD-10-CM | POA: Insufficient documentation

## 2012-11-04 DIAGNOSIS — I2510 Atherosclerotic heart disease of native coronary artery without angina pectoris: Secondary | ICD-10-CM | POA: Diagnosis not present

## 2012-11-04 DIAGNOSIS — I214 Non-ST elevation (NSTEMI) myocardial infarction: Secondary | ICD-10-CM

## 2012-11-04 MED ORDER — TECHNETIUM TC 99M SESTAMIBI GENERIC - CARDIOLITE
11.0000 | Freq: Once | INTRAVENOUS | Status: AC | PRN
  Administered 2012-11-04: 11 via INTRAVENOUS

## 2012-11-04 MED ORDER — TECHNETIUM TC 99M SESTAMIBI GENERIC - CARDIOLITE
33.0000 | Freq: Once | INTRAVENOUS | Status: AC | PRN
  Administered 2012-11-04: 33 via INTRAVENOUS

## 2012-11-04 MED ORDER — REGADENOSON 0.4 MG/5ML IV SOLN
0.4000 mg | Freq: Once | INTRAVENOUS | Status: AC
  Administered 2012-11-04: 0.4 mg via INTRAVENOUS

## 2012-11-04 NOTE — Progress Notes (Signed)
[facility] SITE 3 NUCLEAR MED [address] [place], [place] [address] [phone]    Cardiology Nuclear Med Study  Monica Lee is a 62 y.o. female     MRN : [id number]     DOB: [birth year]  Procedure Date: 11/04/2012  Nuclear Med Background Indication for Stress Test:  Evaluation for Ischemia, and Surgical Clearance for  (R) breast Biopsy on 11-09-12 by Cyndia Bent, MD History:  CAD;MI; 3 yrs ago Myocardial Perfusion Imaging-normal per patient Monica Lee, [place]); 06-2009 Echo: EF=55%; 10-19-12 MR: moderate to severe LVH, EF=55%  Cardiac Risk Factors: CVA, Family History - CAD, Hypertension, IDDM,and Lipids  Symptoms:  SOB   Nuclear Pre-Procedure Caffeine/Decaff Intake:  None > 12 hrs NPO After: 8:00am   Lungs:Coarse breath sounds, no wheezing on arrival. O2 Sat: 95%% on room air. IV 0.9% NS with Angio Cath:  22g  IV Site: L Antecubital x 1, tolerated well IV Started by:  Irean Hong, RN  Chest Size (in):  38 Cup Size: C  Height: 5\' 5"  (1.651 m)  Weight:  193 lb (87.544 kg)  BMI:  Body mass index is 32.12 kg/(m^2). Tech Comments: Fasting CBG was 195 at 0800 today.  Full dose insulin at 0800 this am with breakfast.Took Lopressor this am. Irean Hong, RN.    Nuclear Med Study 1 or 2 day study: 1 day  Stress Test Type:  Lexiscan  Reading MD: Willa Rough, MD  Order Authorizing Provider:  Charlton Haws, MD  Resting Radionuclide: Technetium 15m Sestamibi  Resting Radionuclide Dose: 11.0 mCi   Stress Radionuclide:  Technetium 67m Sestamibi  Stress Radionuclide Dose: 33.0 mCi           Stress Protocol Rest HR: 67 Stress HR: 75  Rest BP: 125/92 Stress BP: 170/96  Exercise Time (min): n/a METS: n/a   Predicted Max HR: 159 bpm % Max HR: 47.17 bpm Rate Pressure Product: 84696   Dose of Adenosine (mg):  n/a Dose of Lexiscan: 0.4 mg  Dose of Atropine (mg): n/a Dose of Dobutamine: n/a mcg/kg/min (at max HR)  Stress Test Technologist: Nelson Chimes, BS-ES   Nuclear Technologist:  Dario Guardian, CNMT     Rest Procedure:  Myocardial perfusion imaging was performed at rest 45 minutes following the intravenous administration of Technetium 54m Sestamibi. Rest ECG: Normal sinus rhythm. Decreased anterior R wave progression. Diffuse T wave changes  Stress Procedure:  The patient received IV Lexiscan 0.4 mg over 15-seconds.  Technetium 45m Sestamibi injected at 30-seconds.  Quantitative spect images were obtained after a 45 minute delay. Patient had some SOB with the infusion of Lexiscan.  Symptoms resolved with recovery.  Stress ECG: No significant change from baseline ECG  QPS Raw Data Images:  Normal; no motion artifact; normal heart/lung ratio. Stress Images:  There is a moderate area with moderate decreased activity affecting base/mid anterolateral wall, base/mid inferolateral wall and apical lateral segment. This serious fixed in addition there is a small area with moderate decreased uptake affecting the mid/apical anterior wall. This serious reversible . In addition there is a small area of moderate decreased uptake affecting the mid/apical inferior wall. This serious reversible  Rest Images:  Moderate area with moderate decreased activity affecting base/mid anterolateral wall, base/mid inferolateral wall, apical lateral segment.  Subtraction (SDS):  There is ischemia in the mid/apical anterior wall in the mid/apical inferior wall Transient Ischemic Dilatation (Normal <1.22):  1.16 Lung/Heart Ratio (Normal <0.45):  0.27  Quantitative Gated Spect Images QGS EDV:  169 ml QGS ESV:  122 ml  Impression Exercise Capacity:  Lexiscan with no exercise. BP Response:  Normal blood pressure response. Clinical Symptoms:  Shortness of breath ECG Impression:  No significant ST segment change suggestive of ischemia. Comparison with Prior Nuclear Study: No images to compare  Overall Impression:  This is a high-risk scan. There is a fixed defect affecting the  entire inferolateral wall and anterolateral wall. The apical cap does not appear to be affected. In addition there are 2 areas of ischemia. There is moderate ischemia at the mid/apical anterior wall and at the mid/apical inferior wall. There is severe LV dysfunction.  LV Ejection Fraction: 28%.  LV Wall Motion:    There is global hypokinesis.  Willa Rough, MD

## 2012-11-04 NOTE — Telephone Encounter (Signed)
sw pt gv appt for 11/23/12 @ 12:45p. Pt is aware...td

## 2012-11-05 NOTE — Progress Notes (Signed)
Dr Myrtis Ser read myo-high risk scan-ef 28%-too sick to be done [facility]-dr streck's office notified-needs to be done main or

## 2012-11-06 ENCOUNTER — Encounter (HOSPITAL_BASED_OUTPATIENT_CLINIC_OR_DEPARTMENT_OTHER): Payer: Self-pay | Admitting: *Deleted

## 2012-11-06 ENCOUNTER — Other Ambulatory Visit (HOSPITAL_COMMUNITY): Payer: Self-pay | Admitting: *Deleted

## 2012-11-06 ENCOUNTER — Ambulatory Visit (INDEPENDENT_AMBULATORY_CARE_PROVIDER_SITE_OTHER): Payer: Medicare Other | Admitting: Cardiology

## 2012-11-06 ENCOUNTER — Other Ambulatory Visit (HOSPITAL_COMMUNITY): Payer: Self-pay | Admitting: Cardiology

## 2012-11-06 ENCOUNTER — Telehealth: Payer: Self-pay | Admitting: Cardiology

## 2012-11-06 ENCOUNTER — Ambulatory Visit (HOSPITAL_BASED_OUTPATIENT_CLINIC_OR_DEPARTMENT_OTHER): Payer: Medicare Other | Admitting: Radiology

## 2012-11-06 ENCOUNTER — Encounter (HOSPITAL_COMMUNITY): Payer: Self-pay | Admitting: Pharmacy Technician

## 2012-11-06 ENCOUNTER — Encounter: Payer: Self-pay | Admitting: Cardiology

## 2012-11-06 VITALS — BP 155/82 | HR 77 | Ht 65.0 in | Wt 198.0 lb

## 2012-11-06 DIAGNOSIS — Z01810 Encounter for preprocedural cardiovascular examination: Secondary | ICD-10-CM

## 2012-11-06 DIAGNOSIS — I214 Non-ST elevation (NSTEMI) myocardial infarction: Secondary | ICD-10-CM

## 2012-11-06 DIAGNOSIS — I2510 Atherosclerotic heart disease of native coronary artery without angina pectoris: Secondary | ICD-10-CM

## 2012-11-06 DIAGNOSIS — R293 Abnormal posture: Secondary | ICD-10-CM | POA: Diagnosis not present

## 2012-11-06 DIAGNOSIS — IMO0001 Reserved for inherently not codable concepts without codable children: Secondary | ICD-10-CM | POA: Diagnosis not present

## 2012-11-06 DIAGNOSIS — M159 Polyosteoarthritis, unspecified: Secondary | ICD-10-CM | POA: Diagnosis not present

## 2012-11-06 DIAGNOSIS — I252 Old myocardial infarction: Secondary | ICD-10-CM | POA: Diagnosis not present

## 2012-11-06 DIAGNOSIS — C50919 Malignant neoplasm of unspecified site of unspecified female breast: Secondary | ICD-10-CM | POA: Diagnosis not present

## 2012-11-06 NOTE — Patient Instructions (Addendum)
Your physician recommends that you schedule a follow-up appointment in: 2 months with dr Eden Emms

## 2012-11-06 NOTE — Telephone Encounter (Signed)
Spoke with pt, aware schedule is full. Patient voiced understanding

## 2012-11-06 NOTE — Telephone Encounter (Signed)
New Problem:  Pt states she wants a sooner appt. Pt wants to know if she can be worked in earlier. I informed pt the doctor was only seeing pt's from 1-4:30 and that he had a full schedule. Pt wants to speak to his covering nurse to see if she can come in sooner. Please advise

## 2012-11-06 NOTE — Progress Notes (Signed)
HPI The patient was added to my schedule to discuss an abnormal stress test. She recently saw Dr. Eden Emms.  She is being evaluated for breast lumpectomy. She's apparently had a history of myocardial infarction but previous cath [place] was reported not to have any coronary disease. Recognition referred her for an MRI which demonstrated moderately severe LVH with an EF of 55%. There was no true evidence of HCM.  There was a pericardial effusion noted. Stress perfusion study yesterday was read as high risk with a fixed defect in the inferolateral and anterolateral wall. I brought her back to discuss this. There was a suggestion of regional wall motion abnormality reduced ejection fraction on his nuclear study. The patient however has had no cardiovascular symptoms recently. She's not having any chest pressure, neck or arm discomfort. She's not having any palpitations, presyncope or syncope. She's not having any PND or orthopnea. I did send her for an echocardiogram today she has a hyperdynamic LV with moderate to severe left ventricular hypertrophy. There is a moderate-sized pericardial effusion not in a veryand I reviewed these images.  Allergies  Allergen Reactions  . Cephalexin Swelling    Swelling to face, chills  . Propoxyphene-Acetaminophen Hives and Itching    Current Outpatient Prescriptions  Medication Sig Dispense Refill  . albuterol (ACCUNEB) 0.63 MG/3ML nebulizer solution Take 3 mLs (0.63 mg total) by nebulization every 6 (six) hours as needed for wheezing.  75 mL  12  . albuterol (PROVENTIL HFA;VENTOLIN HFA) 108 (90 BASE) MCG/ACT inhaler Inhale 2 puffs into the lungs every 6 (six) hours as needed for wheezing.      Marland Kitchen amLODipine (NORVASC) 10 MG tablet Take 1 tablet (10 mg total) by mouth daily.  90 tablet  3  . aspirin EC 325 MG tablet Take 325 mg by mouth daily.        Marland Kitchen azaTHIOprine (IMURAN) 50 MG tablet Take 50 mg by mouth 2 (two) times daily.        . brimonidine (ALPHAGAN) 0.2 %  ophthalmic solution Place 1 drop into the right eye 2 (two) times daily.      . cinacalcet (SENSIPAR) 30 MG tablet Take 1 tablet (30 mg total) by mouth daily.  30 tablet  11  . docusate sodium (COLACE) 100 MG capsule Take 1 capsule (100 mg total) by mouth 2 (two) times daily.  60 capsule  0  . esomeprazole (NEXIUM) 40 MG capsule Take 40 mg by mouth daily before breakfast.      . furosemide (LASIX) 80 MG tablet Take 160 mg by mouth 2 (two) times daily.       . insulin aspart (NOVOLOG) 100 UNIT/ML injection Inject 13-16 Units into the skin 3 (three) times daily. Injects 13 units daily at breakfast, 13 units daily at lunch, and 16 units daily at supper.      . insulin glargine (LANTUS) 100 UNIT/ML injection Inject 55 Units into the skin at bedtime.      . isosorbide mononitrate (IMDUR) 60 MG 24 hr tablet Take 60 mg by mouth daily.      . Magnesium Oxide 250 MG TABS Take 1 tablet by mouth daily.       . metoprolol (LOPRESSOR) 100 MG tablet Take 100 mg by mouth 2 (two) times daily.      . minoxidil (LONITEN) 2.5 MG tablet Take 2.5 mg by mouth 2 (two) times daily.      . Multiple Vitamin (MULTIVITAMIN) capsule Take 1 capsule by mouth daily.        Marland Kitchen  nitroGLYCERIN (NITROSTAT) 0.4 MG SL tablet Place 1 tablet (0.4 mg total) under the tongue every 5 (five) minutes as needed for chest pain.  25 tablet  11  . polyethylene glycol (MIRALAX / GLYCOLAX) packet Take 17 g by mouth daily.      . potassium chloride (KLOR-CON 10) 10 MEQ tablet Take 1 tablet (10 mEq total) by mouth 2 (two) times daily.  90 tablet  3  . pravastatin (PRAVACHOL) 20 MG tablet Take 10 mg by mouth at bedtime.        . prednisoLONE acetate (PRED FORTE) 1 % ophthalmic suspension Place 1 drop into the left eye 3 (three) times daily.      . predniSONE (DELTASONE) 10 MG tablet Take 10 mg by mouth daily.      . tacrolimus (PROGRAF) 1 MG capsule Take 9 mg by mouth 2 (two) times daily.       . timolol (TIMOPTIC) 0.25 % ophthalmic solution Place 1  drop into the right eye 2 (two) times daily.      . traMADol (ULTRAM) 50 MG tablet Take 50 mg by mouth every 6 (six) hours as needed for pain.       No current facility-administered medications for this visit.    Past Medical History  Diagnosis Date  . CHF (congestive heart failure)   . Diabetes mellitus   . Acid reflux   . S/P kidney transplant   . CAD (coronary artery disease)     native vessel  . GERD (gastroesophageal reflux disease)   . Diastolic dysfunction   . Hyperlipidemia   . DJD (degenerative joint disease)   . TIA (transient ischemic attack)   . Hx of colonic polyps   . PUD (peptic ulcer disease)   . Diabetic retinopathy   . Heart murmur   . Hemorrhoids   . Generalized headaches   . Hypertension   . Asthma   . MI (myocardial infarction) 2011  . Kidney [place]   . Nephrolithiasis   . Mental disorder   . Stroke 2008    no deficits  . Anemia     Hx-not current  . Breast cancer   . Blind left eye     Past Surgical History  Procedure Laterality Date  . Transplantation renal  05/2006  . Cardiac catheterization  2011  . Abdominal hysterectomy    . Cataract extraction  bil  . Intestinal perforation surgery  01/2009  . Breast biopsy  2010  . Gallstones removed    . Cholecystectomy  2008  . Colon surgery  2013    polyps removed  . Hemorrhoid surgery  01/23/2012    Procedure: HEMORRHOIDECTOMY PROLAPSED;  Surgeon: Romie Levee, MD;  [facility];  Service: General;  Laterality: N/A;  . Eye surgery Left 1997    Family History  Problem Relation Age of Onset  . Kidney disease Neg Hx   . Arthritis Other     parent  . Heart disease Other     parent  . Hypertension Other     parent  . Diabetes Other     parent  . Diabetes Other     grandparent  . Thyroid disease Other     several  . Heart disease Father     History   Social History  . Marital Status: Legally Separated    Spouse Name: N/A    Number of Children: 4  . Years of  Education: N/A   Occupational History  .  Disables/SSI since 2002    Social History Main Topics  . Smoking status: Never Smoker   . Smokeless tobacco: Never Used  . Alcohol Use: No  . Drug Use: No  . Sexual Activity: Not on file   Other Topics Concern  . Not on file   Social History Narrative  . No narrative on file    ROS:  As stated in the HPI and negative for all other systems.  PHYSICAL EXAM BP 155/82  Pulse 77  Ht 5\' 5"  (1.651 m)  Wt 198 lb (89.812 kg)  BMI 32.95 kg/m2 GENERAL:  Well appearing HEENT:  Pupils equal round and reactive, fundi not visualized, oral mucosa unremarkable NECK:  No jugular venous distention, waveform within normal limits, carotid upstroke brisk and symmetric, no bruits, no thyromegaly LYMPHATICS:  No cervical, inguinal adenopathy LUNGS:  Clear to auscultation bilaterally BACK:  No CVA tenderness CHEST:  Unremarkable HEART:  PMI not displaced or sustained,S1 and S2 within normal limits, no S3, no S4, no clicks, no rubs, no murmurs ABD:  Flat, positive bowel sounds normal in frequency in pitch, no bruits, no rebound, no guarding, no midline pulsatile mass, no hepatomegaly, no splenomegaly EXT:  2 plus pulses throughout, no edema, no cyanosis no clubbing SKIN:  No rashes no nodules NEURO:  Cranial nerves II through XII grossly intact, motor grossly intact throughout PSYCH:  Cognitively intact, oriented to person place and time   ASSESSMENT AND PLAN  PREOP:  I believe that the totality of the information suggests that the patient has well-preserved ejection fraction. I reviewed the nuclear study with Dr. Tenny Craw.  It quite possible that the fixed defect is artifactual.  There is no regional wall motion abnormality on her echo which I reviewed today. She's not having any symptoms. Therefore, I do not think further cardiovascular testing is suggested. She has a reasonable functional level. According to ACC/AHA guidelines the patient would be at  acceptable risk for the planned procedure on Monday.  HTN:  The patient has LVH on echo.  She needs continued BP control.  Consider sleep apnea evaluation in the future.  PERICARIDIAL EFFUSION:  She will need follow up echo for this.  I have scheduled a return appt for the patient with Dr. Eden Emms.     (Greater than 40 minutes reviewing all data with greater than 50% face to face with the patient).

## 2012-11-06 NOTE — Progress Notes (Signed)
Echocardiogram performed.  

## 2012-11-08 MED ORDER — CHLORHEXIDINE GLUCONATE 4 % EX LIQD: 1.0000 "application " | Freq: Once | CUTANEOUS | Status: DC

## 2012-11-08 MED ORDER — CIPROFLOXACIN IN D5W 400 MG/200ML IV SOLN
400.0000 mg | INTRAVENOUS | Status: AC
  Administered 2012-11-09: 400 mg via INTRAVENOUS
  Filled 2012-11-08: qty 200

## 2012-11-09 ENCOUNTER — Encounter (HOSPITAL_COMMUNITY): Payer: Self-pay | Admitting: *Deleted

## 2012-11-09 ENCOUNTER — Ambulatory Visit (HOSPITAL_COMMUNITY)
Admission: RE | Admit: 2012-11-09 | Discharge: 2012-11-09 | Disposition: A | Discharge status: Home / Self Care | Payer: Medicare Other | Source: Ambulatory Visit | Attending: Surgery | Admitting: Surgery

## 2012-11-09 ENCOUNTER — Encounter (HOSPITAL_COMMUNITY)
Admission: RE | Disposition: A | Discharge status: Home / Self Care | Payer: Self-pay | Source: Ambulatory Visit | Attending: Surgery

## 2012-11-09 ENCOUNTER — Encounter (HOSPITAL_COMMUNITY): Payer: Medicare Other | Admitting: Anesthesiology

## 2012-11-09 ENCOUNTER — Ambulatory Visit (HOSPITAL_COMMUNITY): Payer: Medicare Other | Admitting: Anesthesiology

## 2012-11-09 ENCOUNTER — Encounter (HOSPITAL_COMMUNITY)
Admission: RE | Admit: 2012-11-09 | Discharge: 2012-11-09 | Disposition: A | Discharge status: Home / Self Care | Payer: Medicare Other | Source: Ambulatory Visit | Attending: Surgery | Admitting: Surgery

## 2012-11-09 DIAGNOSIS — E669 Obesity, unspecified: Secondary | ICD-10-CM | POA: Diagnosis present

## 2012-11-09 DIAGNOSIS — R011 Cardiac murmur, unspecified: Secondary | ICD-10-CM | POA: Insufficient documentation

## 2012-11-09 DIAGNOSIS — E1159 Type 2 diabetes mellitus with other circulatory complications: Secondary | ICD-10-CM | POA: Diagnosis not present

## 2012-11-09 DIAGNOSIS — C50919 Malignant neoplasm of unspecified site of unspecified female breast: Secondary | ICD-10-CM | POA: Diagnosis not present

## 2012-11-09 DIAGNOSIS — C50511 Malignant neoplasm of lower-outer quadrant of right female breast: Secondary | ICD-10-CM

## 2012-11-09 DIAGNOSIS — Z7982 Long term (current) use of aspirin: Secondary | ICD-10-CM | POA: Diagnosis not present

## 2012-11-09 DIAGNOSIS — E0590 Thyrotoxicosis, unspecified without thyrotoxic crisis or storm: Secondary | ICD-10-CM | POA: Diagnosis not present

## 2012-11-09 DIAGNOSIS — R0602 Shortness of breath: Secondary | ICD-10-CM | POA: Diagnosis not present

## 2012-11-09 DIAGNOSIS — I252 Old myocardial infarction: Secondary | ICD-10-CM | POA: Insufficient documentation

## 2012-11-09 DIAGNOSIS — Z8673 Personal history of transient ischemic attack (TIA), and cerebral infarction without residual deficits: Secondary | ICD-10-CM | POA: Diagnosis not present

## 2012-11-09 DIAGNOSIS — J45909 Unspecified asthma, uncomplicated: Secondary | ICD-10-CM | POA: Diagnosis present

## 2012-11-09 DIAGNOSIS — I10 Essential (primary) hypertension: Secondary | ICD-10-CM | POA: Insufficient documentation

## 2012-11-09 DIAGNOSIS — I5033 Acute on chronic diastolic (congestive) heart failure: Secondary | ICD-10-CM | POA: Diagnosis not present

## 2012-11-09 DIAGNOSIS — E119 Type 2 diabetes mellitus without complications: Secondary | ICD-10-CM | POA: Insufficient documentation

## 2012-11-09 DIAGNOSIS — Z6832 Body mass index (BMI) 32.0-32.9, adult: Secondary | ICD-10-CM | POA: Diagnosis not present

## 2012-11-09 DIAGNOSIS — J9600 Acute respiratory failure, unspecified whether with hypoxia or hypercapnia: Secondary | ICD-10-CM | POA: Diagnosis not present

## 2012-11-09 DIAGNOSIS — D0590 Unspecified type of carcinoma in situ of unspecified breast: Secondary | ICD-10-CM | POA: Diagnosis not present

## 2012-11-09 DIAGNOSIS — E1139 Type 2 diabetes mellitus with other diabetic ophthalmic complication: Secondary | ICD-10-CM | POA: Diagnosis not present

## 2012-11-09 DIAGNOSIS — I739 Peripheral vascular disease, unspecified: Secondary | ICD-10-CM | POA: Insufficient documentation

## 2012-11-09 DIAGNOSIS — E785 Hyperlipidemia, unspecified: Secondary | ICD-10-CM | POA: Diagnosis present

## 2012-11-09 DIAGNOSIS — K219 Gastro-esophageal reflux disease without esophagitis: Secondary | ICD-10-CM | POA: Insufficient documentation

## 2012-11-09 DIAGNOSIS — E11319 Type 2 diabetes mellitus with unspecified diabetic retinopathy without macular edema: Secondary | ICD-10-CM | POA: Diagnosis present

## 2012-11-09 DIAGNOSIS — Z794 Long term (current) use of insulin: Secondary | ICD-10-CM | POA: Diagnosis not present

## 2012-11-09 DIAGNOSIS — E039 Hypothyroidism, unspecified: Secondary | ICD-10-CM | POA: Diagnosis present

## 2012-11-09 DIAGNOSIS — C50519 Malignant neoplasm of lower-outer quadrant of unspecified female breast: Secondary | ICD-10-CM | POA: Diagnosis not present

## 2012-11-09 DIAGNOSIS — I509 Heart failure, unspecified: Secondary | ICD-10-CM | POA: Diagnosis present

## 2012-11-09 DIAGNOSIS — I2510 Atherosclerotic heart disease of native coronary artery without angina pectoris: Secondary | ICD-10-CM | POA: Insufficient documentation

## 2012-11-09 DIAGNOSIS — D649 Anemia, unspecified: Secondary | ICD-10-CM | POA: Insufficient documentation

## 2012-11-09 DIAGNOSIS — Z940 Kidney transplant status: Secondary | ICD-10-CM | POA: Diagnosis not present

## 2012-11-09 DIAGNOSIS — M1990 Unspecified osteoarthritis, unspecified site: Secondary | ICD-10-CM | POA: Diagnosis present

## 2012-11-09 HISTORY — PX: BREAST LUMPECTOMY: SHX2

## 2012-11-09 HISTORY — PX: BREAST LUMPECTOMY WITH SENTINEL LYMPH NODE BIOPSY: SHX5597

## 2012-11-09 LAB — CBC
Hemoglobin: 12.4 g/dL (ref 12.0–15.0)
MCH: 29.5 pg (ref 26.0–34.0)
MCHC: 31.3 g/dL (ref 30.0–36.0)
MCV: 94.1 fL (ref 78.0–100.0)
Platelets: 189 10*3/uL (ref 150–400)
RBC: 4.21 MIL/uL (ref 3.87–5.11)
RDW: 15 % (ref 11.5–15.5)
WBC: 6 10*3/uL (ref 4.0–10.5)

## 2012-11-09 LAB — BASIC METABOLIC PANEL
CO2: 30 mEq/L (ref 19–32)
Calcium: 8.9 mg/dL (ref 8.4–10.5)
Creatinine, Ser: 1.14 mg/dL — ABNORMAL HIGH (ref 0.50–1.10)
GFR calc Af Amer: 59 mL/min — ABNORMAL LOW (ref >90)
GFR calc non Af Amer: 51 mL/min — ABNORMAL LOW (ref >90)
Glucose, Bld: 221 mg/dL — ABNORMAL HIGH (ref 70–99)
Potassium: 3.9 mEq/L (ref 3.5–5.1)
Sodium: 140 mEq/L (ref 135–145)

## 2012-11-09 LAB — GLUCOSE, CAPILLARY
Glucose-Capillary: 258 mg/dL — ABNORMAL HIGH (ref 70–99)
Glucose-Capillary: 239 mg/dL — ABNORMAL HIGH (ref 70–99)

## 2012-11-09 SURGERY — BREAST LUMPECTOMY WITH AXILLARY LYMPH NODE BIOPSY
Anesthesia: General | Site: Breast | Laterality: Right

## 2012-11-09 SURGERY — BREAST LUMPECTOMY WITH SENTINEL LYMPH NODE BX
Anesthesia: General | Site: Breast | Laterality: Right | Wound class: Clean

## 2012-11-09 MED ORDER — BUPIVACAINE HCL (PF) 0.25 % IJ SOLN
INTRAMUSCULAR | Status: AC
  Filled 2012-11-09: qty 30

## 2012-11-09 MED ORDER — ALBUTEROL SULFATE (5 MG/ML) 0.5% IN NEBU
2.5000 mg | INHALATION_SOLUTION | Freq: Four times a day (QID) | RESPIRATORY_TRACT | Status: DC | PRN
  Administered 2012-11-09: 2.5 mg via RESPIRATORY_TRACT

## 2012-11-09 MED ORDER — ARTIFICIAL TEARS OP OINT
TOPICAL_OINTMENT | OPHTHALMIC | Status: DC | PRN
  Administered 2012-11-09: 1 via OPHTHALMIC

## 2012-11-09 MED ORDER — ONDANSETRON HCL 4 MG/2ML IJ SOLN
INTRAMUSCULAR | Status: DC | PRN
  Administered 2012-11-09: 4 mg via INTRAVENOUS

## 2012-11-09 MED ORDER — SODIUM CHLORIDE 0.9 % IJ SOLN
INTRAMUSCULAR | Status: DC | PRN
  Administered 2012-11-09: 12:00:00 via INTRAMUSCULAR

## 2012-11-09 MED ORDER — HYDROCODONE-ACETAMINOPHEN 5-325 MG PO TABS: 1.0000 | ORAL_TABLET | ORAL | Status: DC | PRN

## 2012-11-09 MED ORDER — SODIUM CHLORIDE 0.9 % IJ SOLN
INTRAMUSCULAR | Status: AC
  Filled 2012-11-09: qty 10

## 2012-11-09 MED ORDER — PROPOFOL 10 MG/ML IV BOLUS
INTRAVENOUS | Status: DC | PRN
  Administered 2012-11-09: 150 mg via INTRAVENOUS

## 2012-11-09 MED ORDER — METHYLENE BLUE 1 % INJ SOLN
INTRAMUSCULAR | Status: AC
  Filled 2012-11-09: qty 10

## 2012-11-09 MED ORDER — HYDROMORPHONE HCL PF 1 MG/ML IJ SOLN
0.2500 mg | INTRAMUSCULAR | Status: DC | PRN
  Administered 2012-11-09 (×2): 0.5 mg via INTRAVENOUS

## 2012-11-09 MED ORDER — MIDAZOLAM HCL 5 MG/5ML IJ SOLN
INTRAMUSCULAR | Status: DC | PRN
  Administered 2012-11-09: 1 mg via INTRAVENOUS

## 2012-11-09 MED ORDER — BUPIVACAINE HCL (PF) 0.25 % IJ SOLN
INTRAMUSCULAR | Status: DC | PRN
  Administered 2012-11-09: 20 mL
  Administered 2012-11-09: 10 mL

## 2012-11-09 MED ORDER — 0.9 % SODIUM CHLORIDE (POUR BTL) OPTIME
TOPICAL | Status: DC | PRN
  Administered 2012-11-09: 1000 mL

## 2012-11-09 MED ORDER — SUCCINYLCHOLINE CHLORIDE 20 MG/ML IJ SOLN
INTRAMUSCULAR | Status: DC | PRN
  Administered 2012-11-09: 100 mg via INTRAVENOUS

## 2012-11-09 MED ORDER — HYDROCORTISONE SOD SUCCINATE 100 MG IJ SOLR
INTRAMUSCULAR | Status: DC | PRN
  Administered 2012-11-09: 100 mg via INTRAVENOUS

## 2012-11-09 MED ORDER — TECHNETIUM TC 99M SULFUR COLLOID FILTERED
1.0000 | Freq: Once | INTRAVENOUS | Status: AC | PRN
  Administered 2012-11-09: 1 via INTRADERMAL

## 2012-11-09 MED ORDER — ALBUTEROL SULFATE HFA 108 (90 BASE) MCG/ACT IN AERS
INHALATION_SPRAY | RESPIRATORY_TRACT | Status: DC | PRN
  Administered 2012-11-09: 4 via RESPIRATORY_TRACT

## 2012-11-09 MED ORDER — ALBUTEROL SULFATE (5 MG/ML) 0.5% IN NEBU
INHALATION_SOLUTION | RESPIRATORY_TRACT | Status: AC
  Administered 2012-11-09: 2.5 mg via RESPIRATORY_TRACT
  Filled 2012-11-09: qty 0.5

## 2012-11-09 MED ORDER — FENTANYL CITRATE 0.05 MG/ML IJ SOLN
INTRAMUSCULAR | Status: DC | PRN
  Administered 2012-11-09: 150 ug via INTRAVENOUS

## 2012-11-09 MED ORDER — FENTANYL CITRATE 0.05 MG/ML IJ SOLN
50.0000 ug | Freq: Once | INTRAMUSCULAR | Status: AC
  Administered 2012-11-09: 50 ug via INTRAVENOUS
  Filled 2012-11-09: qty 2

## 2012-11-09 MED ORDER — PHENYLEPHRINE HCL 10 MG/ML IJ SOLN
INTRAMUSCULAR | Status: DC | PRN
  Administered 2012-11-09: 40 ug via INTRAVENOUS

## 2012-11-09 MED ORDER — HYDROMORPHONE HCL PF 1 MG/ML IJ SOLN
INTRAMUSCULAR | Status: AC
  Administered 2012-11-09: 0.5 mg via INTRAVENOUS
  Filled 2012-11-09: qty 1

## 2012-11-09 MED ORDER — MIDAZOLAM HCL 2 MG/2ML IJ SOLN
1.0000 mg | INTRAMUSCULAR | Status: DC | PRN
  Administered 2012-11-09: 1 mg via INTRAVENOUS
  Filled 2012-11-09: qty 2

## 2012-11-09 MED ORDER — SODIUM CHLORIDE 0.9 % IV SOLN
INTRAVENOUS | Status: DC
  Administered 2012-11-09 (×4): via INTRAVENOUS

## 2012-11-09 MED ORDER — LIDOCAINE HCL (CARDIAC) 20 MG/ML IV SOLN
INTRAVENOUS | Status: DC | PRN
  Administered 2012-11-09: 50 mg via INTRAVENOUS

## 2012-11-09 SURGICAL SUPPLY — 47 items
APPLIER CLIP 9.375 MED OPEN (MISCELLANEOUS)
BINDER BREAST LRG (GAUZE/BANDAGES/DRESSINGS) IMPLANT
BINDER BREAST XLRG (GAUZE/BANDAGES/DRESSINGS) IMPLANT
BLADE SURG 15 STRL LF DISP TIS (BLADE) ×1 IMPLANT
BLADE SURG 15 STRL SS (BLADE) ×1
CANISTER SUCTION 2500CC (MISCELLANEOUS) ×2 IMPLANT
CHLORAPREP W/TINT 26ML (MISCELLANEOUS) ×2 IMPLANT
CLIP APPLIE 9.375 MED OPEN (MISCELLANEOUS) IMPLANT
CLIP TI WIDE RED SMALL 6 (CLIP) ×2 IMPLANT
CONT SPEC 4OZ CLIKSEAL STRL BL (MISCELLANEOUS) ×8 IMPLANT
COVER PROBE W GEL 5X96 (DRAPES) ×2 IMPLANT
COVER SURGICAL LIGHT HANDLE (MISCELLANEOUS) ×2 IMPLANT
DERMABOND ADVANCED (GAUZE/BANDAGES/DRESSINGS) ×1
DERMABOND ADVANCED .7 DNX12 (GAUZE/BANDAGES/DRESSINGS) ×1 IMPLANT
DEVICE DUBIN SPECIMEN MAMMOGRA (MISCELLANEOUS) IMPLANT
DRAPE LAPAROSCOPIC ABDOMINAL (DRAPES) ×2 IMPLANT
ELECT CAUTERY BLADE 6.4 (BLADE) ×2 IMPLANT
ELECT REM PT RETURN 9FT ADLT (ELECTROSURGICAL) ×2
ELECTRODE REM PT RTRN 9FT ADLT (ELECTROSURGICAL) ×1 IMPLANT
GLOVE BIOGEL PI IND STRL 7.0 (GLOVE) ×1 IMPLANT
GLOVE BIOGEL PI INDICATOR 7.0 (GLOVE) ×1
GLOVE ECLIPSE 7.0 STRL STRAW (GLOVE) ×2 IMPLANT
GLOVE EUDERMIC 7 POWDERFREE (GLOVE) ×2 IMPLANT
GLOVE SS BIOGEL STRL SZ 6.5 (GLOVE) ×1 IMPLANT
GLOVE SUPERSENSE BIOGEL SZ 6.5 (GLOVE) ×1
GOWN STRL NON-REIN LRG LVL3 (GOWN DISPOSABLE) ×2 IMPLANT
GOWN STRL REIN XL XLG (GOWN DISPOSABLE) ×2 IMPLANT
KIT BASIN OR (CUSTOM PROCEDURE TRAY) ×2 IMPLANT
KIT MARKER MARGIN INK (KITS) IMPLANT
KIT ROOM TURNOVER OR (KITS) ×2 IMPLANT
NEEDLE 18GX1X1/2 (RX/OR ONLY) (NEEDLE) ×2 IMPLANT
NEEDLE HYPO 25GX1X1/2 BEV (NEEDLE) ×4 IMPLANT
NS IRRIG 1000ML POUR BTL (IV SOLUTION) ×2 IMPLANT
PACK SURGICAL SETUP 50X90 (CUSTOM PROCEDURE TRAY) ×2 IMPLANT
PAD ARMBOARD 7.5X6 YLW CONV (MISCELLANEOUS) ×4 IMPLANT
PENCIL BUTTON HOLSTER BLD 10FT (ELECTRODE) ×2 IMPLANT
SPONGE LAP 4X18 X RAY DECT (DISPOSABLE) ×4 IMPLANT
SUT MON AB 4-0 PC3 18 (SUTURE) ×4 IMPLANT
SUT SILK 2 0 SH (SUTURE) IMPLANT
SUT VIC AB 3-0 SH 18 (SUTURE) ×4 IMPLANT
SYR BULB 3OZ (MISCELLANEOUS) ×2 IMPLANT
SYR CONTROL 10ML LL (SYRINGE) ×4 IMPLANT
TOWEL OR 17X24 6PK STRL BLUE (TOWEL DISPOSABLE) ×2 IMPLANT
TOWEL OR 17X26 10 PK STRL BLUE (TOWEL DISPOSABLE) ×2 IMPLANT
TUBE CONNECTING 12X1/4 (SUCTIONS) ×2 IMPLANT
WATER STERILE IRR 1000ML POUR (IV SOLUTION) IMPLANT
YANKAUER SUCT BULB TIP NO VENT (SUCTIONS) ×2 IMPLANT

## 2012-11-09 NOTE — Interval H&P Note (Signed)
History and Physical Interval Note:  11/09/2012 11:22 AM  Monica Lee  has presented today for surgery, with the diagnosis of right breast cancer  The various methods of treatment have been discussed with the patient and family. After consideration of risks, benefits and other options for treatment, the patient has consented to  Procedure(s): RIGHT BREAST LUMPECTOMY WITH SENTINEL LYMPH NODE REMOVAL (Right) as a surgical intervention .  The patient's history has been reviewed, patient examined, no change in status, stable for surgery.  I have reviewed the patient's chart and labs.  Questions were answered to the patient's satisfaction.   The right breast is marked as the operative side  Monica Lee J

## 2012-11-09 NOTE — Op Note (Signed)
Monica Lee Jun 03, 1950 [id number] 11/09/2012   Preoperative diagnosis: breast cancer, right, lower outer quadrant, clinical stage IIA  Postoperative diagnosis: same  Procedure: right partial mastectomy, blue dye injection, axillary sentinel lymph node dissection  Surgeon: Currie Paris, MD, FACS   Anesthesia: General   Clinical History and Indications: this patient presented with a palpable right breast mass near the inframammary fold on the right. Core biopsy shows cancer. After medical and radiation oncology consultations she elected for partial mastectomy with axillary sentinel lymph node dissection.    Description of Procedure: the patient was seen in the preoperative area and questions answered. The right breast is marked as the operative side. The patient was taken to the operating room after satisfactory general anesthesia was obtained the timeout was performed. 5 cc of dilute methylene blue was injected in the right breast subareolar area and massaged in. A full prep and drape plus a second timeout was then performed.  I made a radial incision starting above the tumor and took an ellipse of skin around the tumor since it was very superficial and extended the incision down over the anterior abdominal wall. Using cautery I did a wide partial mastectomy down to chest wall and grossly was around the tumor in all directions. I then injected 20 cc of 0.25% plain Marcaine to help with postop pain control.  The area was irrigated. Clips were used to mark the margins. Incisions closed in multiple layers with 3-0 Vicryl, 4-0 Monocryl subcuticular, plus Dermabond.  Attention was then turned to the axilla. Using the neoprobe identified a hot area and a transverse incision and divide the subcutaneous tissues. I entered the axillary fat pad and using the neoprobe was able to locate a hot lymph node which had a small blue lymphatic coming into it. This was removed. In the identified another  area nearby and a little dissection reveals a blue lymph node with an adjacent not blue lymph node. Both were removed and both had counts with the blue lymph node having the highest counts of all 3. These were sent to pathology.  0.25% plain Marcaine, 10 cc, was infiltrated for postop pain control. The incision was closed in layers with 3-0 Vicryl, 4-0 Monocryl subcuticular, plus Dermabond. The patient tolerated the procedure well. There were no complications. Counts were correct. Blood loss was minimal. Currie Paris, MD, FACS 11/09/2012 1:22 PM

## 2012-11-09 NOTE — Anesthesia Preprocedure Evaluation (Addendum)
Anesthesia Evaluation  Patient identified by MRN, date of birth, ID band Patient awake    Reviewed: Allergy & Precautions, H&P , NPO status , Patient's Chart, lab work & pertinent test results, reviewed documented beta blocker date and time   Airway Mallampati: II TM Distance: >3 FB Neck ROM: full    Dental  (+) Teeth Intact and Dental Advidsory Given   Pulmonary asthma ,  breath sounds clear to auscultation        Cardiovascular hypertension, + CAD, + Past MI, + Peripheral Vascular Disease and +CHF + Valvular Problems/Murmurs Rhythm:Regular Rate:Normal     Neuro/Psych  Headaches, PSYCHIATRIC DISORDERS CVA, No Residual Symptoms    GI/Hepatic PUD, GERD-  ,  Endo/Other  diabetesHyperthyroidism   Renal/GU Renal disease     Musculoskeletal   Abdominal   Peds  Hematology  (+) anemia ,   Anesthesia Other Findings   Reproductive/Obstetrics                         Anesthesia Physical Anesthesia Plan  ASA: III  Anesthesia Plan: General   Post-op Pain Management:    Induction: Intravenous  Airway Management Planned: LMA  Additional Equipment:   Intra-op Plan:   Post-operative Plan: Extubation in OR  Informed Consent: I have reviewed the patients History and Physical, chart, labs and discussed the procedure including the risks, benefits and alternatives for the proposed anesthesia with the patient or authorized representative who has indicated his/her understanding and acceptance.   Dental advisory given and Dental Advisory Given  Plan Discussed with: CRNA, Anesthesiologist and Surgeon  Anesthesia Plan Comments:       Anesthesia Quick Evaluation

## 2012-11-09 NOTE — Transfer of Care (Signed)
Immediate Anesthesia Transfer of Care Note  Patient: Monica Lee  Procedure(s) Performed: Procedure(s): RIGHT BREAST LUMPECTOMY WITH SENTINEL LYMPH NODE REMOVAL (Right)  Patient Location: PACU  Anesthesia Type:General  Level of Consciousness: awake, alert  and oriented  Airway & Oxygen Therapy: Patient Spontanous Breathing and Patient connected to face mask oxygen  Post-op Assessment: Report given to PACU RN, Post -op Vital signs reviewed and stable and Patient moving all extremities X 4  Post vital signs: Reviewed and stable  Complications: No apparent anesthesia complications

## 2012-11-09 NOTE — Progress Notes (Signed)
Pt had decreased o2 saturations after weaning off nasal cannula. No wheezing or distress. Albuteral tx IH given. Worked with pt to Cough and deep breath. Pt daughter in with pt. Dr. Noreene Larsson made frequent checks. Pt 02 saturation leveled at 91-93 on room air. Released to Phase II

## 2012-11-09 NOTE — H&P (View-Only) (Signed)
Patient ID: Monica Lee, female   DOB: 1950/06/23, 62 y.o.   MRN: [id number]  Chief Complaint  Patient presents with  . Breast Cancer    Right    HPI Monica Lee is a 62 y.o. female.  She recently found a mass in the right breast near the inframammary fold. This has been evaluated with mammogram and ultrasound followed by a needle core biopsy. 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 She did have a prior breast biopsy many years ago for benign disease. She has multiple medical problems as outlined her past medical history. She is scheduled for a cardiology evaluation in about 5 days. She is not having any breast symptoms and has had minimal discomfort since the biopsy.  HPI  Past Medical History  Diagnosis Date  . CHF (congestive heart failure)   . Diabetes mellitus   . Acid reflux   . S/P kidney transplant   . CAD (coronary artery disease)     native vessel  . GERD (gastroesophageal reflux disease)   . Diastolic dysfunction   . Hyperlipidemia   . DJD (degenerative joint disease)   . TIA (transient ischemic attack)   . Hx of colonic polyps   . PUD (peptic ulcer disease)   . Diabetic retinopathy   . Heart murmur   . Hemorrhoids   . Generalized headaches   . Hypertension   . Asthma   . MI (myocardial infarction) 2011  . Kidney Dirr   . Nephrolithiasis   . Mental disorder   . Stroke 2008    no deficits  . Anemia     Hx-not current  . Breast cancer     Past Surgical History  Procedure Laterality Date  . Transplantation renal  05/2006  . Cardiac catheterization  2011  . Abdominal hysterectomy    . Cataract extraction  bil  . Intestinal perforation surgery  01/2009  . Breast biopsy  2010  . Gallstones removed    . Cholecystectomy  2008  . Colon surgery  2013    polyps removed  . Hemorrhoid surgery  01/23/2012    Procedure: HEMORRHOIDECTOMY PROLAPSED;  Surgeon: Romie Levee, MD;  Location: [facility];  Service: General;   Laterality: N/A;  . Eye surgery Left 1997    Family History  Problem Relation Age of Onset  . Kidney disease Neg Hx   . Arthritis Other     parent  . Heart disease Other     parent  . Hypertension Other     parent  . Diabetes Other     parent  . Diabetes Other     grandparent  . Thyroid disease Other     several  . Heart disease Father     Social History History  Substance Use Topics  . Smoking status: Never Smoker   . Smokeless tobacco: Never Used  . Alcohol Use: No    Allergies  Allergen Reactions  . Cephalexin     REACTION: edema/face  . Propoxyphene-Acetaminophen     Current Outpatient Prescriptions  Medication Sig Dispense Refill  . albuterol (ACCUNEB) 0.63 MG/3ML nebulizer solution Take 3 mLs (0.63 mg total) by nebulization every 6 (six) hours as needed for wheezing.  75 mL  12  . albuterol (PROVENTIL HFA;VENTOLIN HFA) 108 (90 BASE) MCG/ACT inhaler Inhale 2 puffs into the lungs every 6 (six) hours as needed for wheezing.      Marland Kitchen amLODipine (NORVASC) 10 MG tablet Take 1 tablet (10  mg total) by mouth daily.  90 tablet  3  . aspirin EC 325 MG tablet Take 325 mg by mouth daily.        Marland Kitchen azaTHIOprine (IMURAN) 50 MG tablet Take 50 mg by mouth 2 (two) times daily.        . brimonidine (ALPHAGAN) 0.2 % ophthalmic solution Place 1 drop into both eyes 2 (two) times daily.        . brimonidine-timolol (COMBIGAN) 0.2-0.5 % ophthalmic solution Place 1 drop into both eyes 2 (two) times daily.        . cinacalcet (SENSIPAR) 30 MG tablet Take 1 tablet (30 mg total) by mouth daily.  30 tablet  11  . docusate sodium (COLACE) 100 MG capsule Take 1 capsule (100 mg total) by mouth 2 (two) times daily.  60 capsule  0  . esomeprazole (NEXIUM) 40 MG capsule Take 40 mg by mouth daily before breakfast.      . furosemide (LASIX) 80 MG tablet Take 160 mg by mouth 2 (two) times daily.       . insulin aspart (NOVOLOG) 100 UNIT/ML injection Inject 15 Units into the skin 3 (three) times daily  before meals.      . insulin glargine (LANTUS) 100 UNIT/ML injection Inject 0.6 mLs (60 Units total) into the skin daily.  30 mL  12  . isosorbide mononitrate (IMDUR) 60 MG 24 hr tablet TAKE ONE TABLET BY MOUTH EVERY DAY  90 tablet  1  . Magnesium Oxide 250 MG TABS Take 1 tablet by mouth daily.       . metoprolol (LOPRESSOR) 100 MG tablet Take 100 mg by mouth 2 (two) times daily.      . minoxidil (LONITEN) 2.5 MG tablet Take 1 tablet (2.5 mg total) by mouth 2 (two) times daily.  60 tablet  5  . Multiple Vitamin (MULTIVITAMIN) capsule Take 1 capsule by mouth daily.        . nitroGLYCERIN (NITROSTAT) 0.4 MG SL tablet Place 1 tablet (0.4 mg total) under the tongue every 5 (five) minutes as needed for chest pain.  25 tablet  11  . polyethylene glycol (MIRALAX / GLYCOLAX) packet Take 17 g by mouth daily.      . potassium chloride (KLOR-CON 10) 10 MEQ tablet Take 1 tablet (10 mEq total) by mouth 2 (two) times daily.  90 tablet  3  . pravastatin (PRAVACHOL) 20 MG tablet Take 10 mg by mouth at bedtime.        . tacrolimus (PROGRAF) 1 MG capsule Take 9 mg by mouth 2 (two) times daily.       . timolol (TIMOPTIC) 0.25 % ophthalmic solution Place 1 drop into the right eye 2 (two) times daily.       . traMADol (ULTRAM) 50 MG tablet TAKE ONE TABLET BY MOUTH EVERY 8 HOURS AS NEEDED FOR PAIN  90 tablet  2   No current facility-administered medications for this visit.    Review of Systems Review of Systems  Constitutional: Negative for fever, chills and unexpected weight change.  HENT: Negative for hearing loss, congestion, sore throat, trouble swallowing and voice change.        Needs glasses, blind OS  Eyes: Negative for visual disturbance.  Respiratory: Negative for cough and wheezing.   Cardiovascular: Negative for chest pain, palpitations and leg swelling.  Gastrointestinal: Negative for nausea, vomiting, abdominal pain, diarrhea, constipation, blood in stool, abdominal distention and anal bleeding.    Genitourinary: Negative for  hematuria, vaginal bleeding and difficulty urinating.  Musculoskeletal: Negative for arthralgias.  Skin: Negative for rash and wound.  Neurological: Negative for seizures, syncope and headaches.  Hematological: Negative for adenopathy. Bruises/bleeds easily.  Psychiatric/Behavioral: Negative for confusion.    Blood pressure 157/52, pulse 71, temperature 97.7 F (36.5 C), resp. rate 20, height 5' 4.5" (1.638 m), weight 191 lb (86.637 kg).  Physical Exam Physical Exam  Eyes:  Appears blind OS  Pulmonary/Chest:    Breasts are negative except as noted in graphic  Lymphadenopathy:    She has no cervical adenopathy.    She has no axillary adenopathy.       Right: No supraclavicular adenopathy present.       Left: No supraclavicular adenopathy present.    Data Reviewed I reviewed the mammogram and ultrasound films and reports and discuss them with her radiologist, I have reviewed the pathology slides and reports and discuss them with the pathologist. I have reviewed her situation with the medical and radiation oncologist.  Assessment    Clinical stage II right breast cancer her inframammary fold, 6:00 position, triple negative     Plan    I have recommended that she undergo lumpectomy. I discussed the risks and complications. I also discussed sentinel lymph node biopsy but I am awaiting input from the medical oncologist about whether or she'll receive any chemotherapy, given her multiple medical comorbidities including insulin-dependent diabetes somewhat poorly controlled, history of renal transplant, and history of coronary artery disease. She does have a cardiology visit scheduled and we may have further information after that is done as to whether she needs any cardiac workup prior to surgery. Currently our plans are going to be for a lumpectomy and Sentinel node dissection. She is anxious to be treated aggressively so she may elect to have postoperative  chemotherapy. We discussed the alternative of neoadjuvant chemotherapy with the medical oncologist but is not 100% certain that she will do chemotherapy and this will depend on the final pathology reports and the final decision by the patient. Therefore we will not plan to put a port in the time of surgery and proceed with a lumpectomy sentinel node.  I have explained the pathophysiology and staging of breast cancer with particular attention to her exact situation. We discussed the multidisciplinary approach to breast cancer which often includes both medical and radiation oncology consultations.  We also discussed surgical options for the treatment of breast cancer including lumpectomy and mastectomy with possible reconstructive surgery. In addition we talked about the evaluation and management of lymph nodes including a description of sentinel lymph node biopsy and axillary dissections. We reviewed potential complications and risks including bleeding, infection, numbness,  lymphedema, and the potential need for additional surgery.  She understands that for patients who are candidate for lumpectomy or mastectomy there is an equal survival rate with either technique, but a slightly higher local recurrence rate with lumpectomy. In addition she knows that a lumpectomy usually requires postoperative radiation as part of the management of the breast cancer.  We have discussed the likely postoperative course and plans for followup.  I have given the patient some written information that reviewed all of these issues. I believe her questions are answered and that she has a good understanding of the issues.          Katelynd Blauvelt J 10/14/2012, 10:25 AM

## 2012-11-09 NOTE — Anesthesia Postprocedure Evaluation (Signed)
  Anesthesia Post-op Note  Patient: Monica Lee  Procedure(s) Performed: Procedure(s): RIGHT BREAST LUMPECTOMY WITH SENTINEL LYMPH NODE REMOVAL (Right)  Patient Location: PACU  Anesthesia Type:General  Level of Consciousness: awake, alert  and oriented  Airway and Oxygen Therapy: Patient Spontanous Breathing and Patient connected to nasal cannula oxygen  Post-op Pain: mild  Post-op Assessment: Post-op Vital signs reviewed, Patient's Cardiovascular Status Stable, Respiratory Function Stable, Patent Airway and Pain level controlled  Post-op Vital Signs: stable  Complications: No apparent anesthesia complications

## 2012-11-09 NOTE — Preoperative (Signed)
Beta Blockers   Reason not to administer Beta Blockers:Not Applicable 

## 2012-11-10 ENCOUNTER — Emergency Department (HOSPITAL_COMMUNITY): Payer: Medicare Other

## 2012-11-10 ENCOUNTER — Other Ambulatory Visit: Payer: Self-pay

## 2012-11-10 ENCOUNTER — Inpatient Hospital Stay (HOSPITAL_COMMUNITY)
Admission: EM | Admit: 2012-11-10 | Discharge: 2012-11-15 | DRG: 264 | Disposition: A | Discharge status: Home / Self Care | Payer: Medicare Other | Attending: Internal Medicine | Admitting: Internal Medicine

## 2012-11-10 ENCOUNTER — Encounter (HOSPITAL_COMMUNITY): Payer: Self-pay | Admitting: Surgery

## 2012-11-10 ENCOUNTER — Telehealth (INDEPENDENT_AMBULATORY_CARE_PROVIDER_SITE_OTHER): Payer: Self-pay | Admitting: *Deleted

## 2012-11-10 DIAGNOSIS — I252 Old myocardial infarction: Secondary | ICD-10-CM

## 2012-11-10 DIAGNOSIS — I5032 Chronic diastolic (congestive) heart failure: Secondary | ICD-10-CM

## 2012-11-10 DIAGNOSIS — I2510 Atherosclerotic heart disease of native coronary artery without angina pectoris: Secondary | ICD-10-CM

## 2012-11-10 DIAGNOSIS — J45909 Unspecified asthma, uncomplicated: Secondary | ICD-10-CM

## 2012-11-10 DIAGNOSIS — H40009 Preglaucoma, unspecified, unspecified eye: Secondary | ICD-10-CM

## 2012-11-10 DIAGNOSIS — M1990 Unspecified osteoarthritis, unspecified site: Secondary | ICD-10-CM | POA: Diagnosis present

## 2012-11-10 DIAGNOSIS — K649 Unspecified hemorrhoids: Secondary | ICD-10-CM

## 2012-11-10 DIAGNOSIS — I509 Heart failure, unspecified: Secondary | ICD-10-CM

## 2012-11-10 DIAGNOSIS — Z860100 Personal history of colon polyps, unspecified: Secondary | ICD-10-CM

## 2012-11-10 DIAGNOSIS — E1139 Type 2 diabetes mellitus with other diabetic ophthalmic complication: Secondary | ICD-10-CM

## 2012-11-10 DIAGNOSIS — Z87442 Personal history of urinary calculi: Secondary | ICD-10-CM

## 2012-11-10 DIAGNOSIS — E0590 Thyrotoxicosis, unspecified without thyrotoxic crisis or storm: Secondary | ICD-10-CM

## 2012-11-10 DIAGNOSIS — C50511 Malignant neoplasm of lower-outer quadrant of right female breast: Secondary | ICD-10-CM

## 2012-11-10 DIAGNOSIS — Z794 Long term (current) use of insulin: Secondary | ICD-10-CM

## 2012-11-10 DIAGNOSIS — Z6832 Body mass index (BMI) 32.0-32.9, adult: Secondary | ICD-10-CM

## 2012-11-10 DIAGNOSIS — I214 Non-ST elevation (NSTEMI) myocardial infarction: Secondary | ICD-10-CM

## 2012-11-10 DIAGNOSIS — L03311 Cellulitis of abdominal wall: Secondary | ICD-10-CM

## 2012-11-10 DIAGNOSIS — D649 Anemia, unspecified: Secondary | ICD-10-CM

## 2012-11-10 DIAGNOSIS — M25511 Pain in right shoulder: Secondary | ICD-10-CM

## 2012-11-10 DIAGNOSIS — I119 Hypertensive heart disease without heart failure: Secondary | ICD-10-CM | POA: Diagnosis present

## 2012-11-10 DIAGNOSIS — J9600 Acute respiratory failure, unspecified whether with hypoxia or hypercapnia: Secondary | ICD-10-CM

## 2012-11-10 DIAGNOSIS — Z0000 Encounter for general adult medical examination without abnormal findings: Secondary | ICD-10-CM

## 2012-11-10 DIAGNOSIS — E669 Obesity, unspecified: Secondary | ICD-10-CM | POA: Diagnosis present

## 2012-11-10 DIAGNOSIS — K3184 Gastroparesis: Secondary | ICD-10-CM

## 2012-11-10 DIAGNOSIS — E1159 Type 2 diabetes mellitus with other circulatory complications: Secondary | ICD-10-CM | POA: Diagnosis present

## 2012-11-10 DIAGNOSIS — E785 Hyperlipidemia, unspecified: Secondary | ICD-10-CM

## 2012-11-10 DIAGNOSIS — M545 Low back pain, unspecified: Secondary | ICD-10-CM

## 2012-11-10 DIAGNOSIS — K279 Peptic ulcer, site unspecified, unspecified as acute or chronic, without hemorrhage or perforation: Secondary | ICD-10-CM

## 2012-11-10 DIAGNOSIS — C50519 Malignant neoplasm of lower-outer quadrant of unspecified female breast: Secondary | ICD-10-CM | POA: Diagnosis present

## 2012-11-10 DIAGNOSIS — Z940 Kidney transplant status: Secondary | ICD-10-CM

## 2012-11-10 DIAGNOSIS — E039 Hypothyroidism, unspecified: Secondary | ICD-10-CM

## 2012-11-10 DIAGNOSIS — Z8679 Personal history of other diseases of the circulatory system: Secondary | ICD-10-CM

## 2012-11-10 DIAGNOSIS — R609 Edema, unspecified: Secondary | ICD-10-CM

## 2012-11-10 DIAGNOSIS — E042 Nontoxic multinodular goiter: Secondary | ICD-10-CM

## 2012-11-10 DIAGNOSIS — Z8673 Personal history of transient ischemic attack (TIA), and cerebral infarction without residual deficits: Secondary | ICD-10-CM

## 2012-11-10 DIAGNOSIS — M7021 Olecranon bursitis, right elbow: Secondary | ICD-10-CM

## 2012-11-10 DIAGNOSIS — E11319 Type 2 diabetes mellitus with unspecified diabetic retinopathy without macular edema: Secondary | ICD-10-CM | POA: Diagnosis present

## 2012-11-10 DIAGNOSIS — K219 Gastro-esophageal reflux disease without esophagitis: Secondary | ICD-10-CM

## 2012-11-10 DIAGNOSIS — I10 Essential (primary) hypertension: Secondary | ICD-10-CM

## 2012-11-10 DIAGNOSIS — R0602 Shortness of breath: Secondary | ICD-10-CM | POA: Diagnosis not present

## 2012-11-10 DIAGNOSIS — J45901 Unspecified asthma with (acute) exacerbation: Secondary | ICD-10-CM

## 2012-11-10 DIAGNOSIS — Z7982 Long term (current) use of aspirin: Secondary | ICD-10-CM

## 2012-11-10 DIAGNOSIS — I5033 Acute on chronic diastolic (congestive) heart failure: Principal | ICD-10-CM

## 2012-11-10 LAB — BASIC METABOLIC PANEL
CO2: 31 mEq/L (ref 19–32)
Calcium: 8.7 mg/dL (ref 8.4–10.5)
Creatinine, Ser: 1.44 mg/dL — ABNORMAL HIGH (ref 0.50–1.10)
GFR calc Af Amer: 44 mL/min — ABNORMAL LOW (ref >90)
GFR calc non Af Amer: 38 mL/min — ABNORMAL LOW (ref >90)
Sodium: 139 mEq/L (ref 135–145)

## 2012-11-10 LAB — GLUCOSE, CAPILLARY

## 2012-11-10 LAB — PRO B NATRIURETIC PEPTIDE: Pro B Natriuretic peptide (BNP): 2877 pg/mL — ABNORMAL HIGH (ref 0–125)

## 2012-11-10 LAB — CBC
HCT: 36 % (ref 36.0–46.0)
MCV: 92.5 fL (ref 78.0–100.0)
Platelets: 203 10*3/uL (ref 150–400)
RBC: 3.89 MIL/uL (ref 3.87–5.11)
RDW: 15.3 % (ref 11.5–15.5)
WBC: 6.2 10*3/uL (ref 4.0–10.5)

## 2012-11-10 LAB — POCT I-STAT TROPONIN I: Troponin i, poc: 0.03 ng/mL (ref 0.00–0.08)

## 2012-11-10 NOTE — ED Notes (Addendum)
Pt reports she weighs herself every night, pt states she has gained 9 pds since last night. Pt reports she had a lumpectomy yesterday in her right breast, pt states there were no complications. Pt states she wear 2L 02 at home all the time.

## 2012-11-10 NOTE — Telephone Encounter (Signed)
I called pt to check on her postoperatively.  Pt had no complaints or concerns and states she is feeling good. I informed pt of her po appt with Dr. Jamey Ripa on 11/26/12 with an arrival time of 1:15pm.  Pt agreeable to this appt and I instructed pt to call us with any questions or concerns.

## 2012-11-10 NOTE — ED Notes (Signed)
Presents with sob began yesterday before lumpectomy from right breast. HX CHF, weight gain over 24 hours of 9 pounds, bilateral edema to hands and abdominal distention. SOB worse with any exertions sats while sitting 94%, while moving mid 80s. Denies chest pain. Bilateral fine crackles and diminished breath sounds.

## 2012-11-10 NOTE — ED Notes (Signed)
Pt alert family at the bedside.  No acute distress no pain except from her surgery she had yesterday.  No other complaints .  Alert oriented skin warm and dry

## 2012-11-10 NOTE — ED Notes (Signed)
Patient transported to X-ray 

## 2012-11-11 DIAGNOSIS — J9600 Acute respiratory failure, unspecified whether with hypoxia or hypercapnia: Secondary | ICD-10-CM | POA: Diagnosis present

## 2012-11-11 DIAGNOSIS — I10 Essential (primary) hypertension: Secondary | ICD-10-CM

## 2012-11-11 DIAGNOSIS — I5033 Acute on chronic diastolic (congestive) heart failure: Principal | ICD-10-CM | POA: Diagnosis present

## 2012-11-11 DIAGNOSIS — E1139 Type 2 diabetes mellitus with other diabetic ophthalmic complication: Secondary | ICD-10-CM

## 2012-11-11 DIAGNOSIS — Z940 Kidney transplant status: Secondary | ICD-10-CM

## 2012-11-11 LAB — COMPREHENSIVE METABOLIC PANEL
ALT: 28 U/L (ref 0–35)
AST: 31 U/L (ref 0–37)
Alkaline Phosphatase: 51 U/L (ref 39–117)
CO2: 29 mEq/L (ref 19–32)
Chloride: 98 mEq/L (ref 96–112)
Creatinine, Ser: 1.25 mg/dL — ABNORMAL HIGH (ref 0.50–1.10)
GFR calc non Af Amer: 45 mL/min — ABNORMAL LOW (ref >90)
Glucose, Bld: 413 mg/dL — ABNORMAL HIGH (ref 70–99)
Potassium: 4.4 mEq/L (ref 3.5–5.1)
Sodium: 138 mEq/L (ref 135–145)
Total Bilirubin: 0.2 mg/dL — ABNORMAL LOW (ref 0.3–1.2)

## 2012-11-11 LAB — GLUCOSE, CAPILLARY

## 2012-11-11 LAB — CBC
HCT: 38.3 % (ref 36.0–46.0)
MCH: 29.7 pg (ref 26.0–34.0)
MCHC: 31.3 g/dL (ref 30.0–36.0)
Platelets: 192 10*3/uL (ref 150–400)
RDW: 15.4 % (ref 11.5–15.5)

## 2012-11-11 LAB — TROPONIN I: Troponin I: <0.3 ng/mL (ref <0.30)

## 2012-11-11 LAB — PROTIME-INR: Prothrombin Time: 13.2 seconds (ref 11.6–15.2)

## 2012-11-11 LAB — TSH: TSH: 25.672 u[IU]/mL — ABNORMAL HIGH (ref 0.350–4.500)

## 2012-11-11 MED ORDER — INSULIN ASPART 100 UNIT/ML ~~LOC~~ SOLN: 0.0000 [IU] | Freq: Every day | SUBCUTANEOUS | Status: DC

## 2012-11-11 MED ORDER — FUROSEMIDE 10 MG/ML IJ SOLN
80.0000 mg | Freq: Once | INTRAMUSCULAR | Status: DC
  Filled 2012-11-11: qty 8

## 2012-11-11 MED ORDER — ALBUTEROL SULFATE 0.63 MG/3ML IN NEBU: 1.0000 | INHALATION_SOLUTION | Freq: Four times a day (QID) | RESPIRATORY_TRACT | Status: DC | PRN

## 2012-11-11 MED ORDER — METOPROLOL TARTRATE 100 MG PO TABS
100.0000 mg | ORAL_TABLET | Freq: Two times a day (BID) | ORAL | Status: DC
  Administered 2012-11-11 – 2012-11-15 (×10): 100 mg via ORAL
  Filled 2012-11-11 (×11): qty 1

## 2012-11-11 MED ORDER — INSULIN GLARGINE 100 UNIT/ML ~~LOC~~ SOLN
30.0000 [IU] | Freq: Two times a day (BID) | SUBCUTANEOUS | Status: DC
  Administered 2012-11-11 (×2): 30 [IU] via SUBCUTANEOUS
  Filled 2012-11-11 (×6): qty 0.3

## 2012-11-11 MED ORDER — ALBUTEROL SULFATE (5 MG/ML) 0.5% IN NEBU
0.6300 mg | INHALATION_SOLUTION | Freq: Four times a day (QID) | RESPIRATORY_TRACT | Status: DC | PRN
Start: 2012-11-11 — End: 2012-11-15

## 2012-11-11 MED ORDER — INSULIN ASPART 100 UNIT/ML ~~LOC~~ SOLN
0.0000 [IU] | Freq: Three times a day (TID) | SUBCUTANEOUS | Status: DC
  Administered 2012-11-11: 15 [IU] via SUBCUTANEOUS
  Administered 2012-11-11: 3 [IU] via SUBCUTANEOUS
  Administered 2012-11-11: 8 [IU] via SUBCUTANEOUS
  Administered 2012-11-12: 3 [IU] via SUBCUTANEOUS
  Administered 2012-11-12: 5 [IU] via SUBCUTANEOUS
  Administered 2012-11-13: 3 [IU] via SUBCUTANEOUS

## 2012-11-11 MED ORDER — PANTOPRAZOLE SODIUM 40 MG PO TBEC
40.0000 mg | DELAYED_RELEASE_TABLET | Freq: Every day | ORAL | Status: DC
  Administered 2012-11-11 – 2012-11-15 (×5): 40 mg via ORAL
  Filled 2012-11-11 (×6): qty 1

## 2012-11-11 MED ORDER — ALBUTEROL SULFATE HFA 108 (90 BASE) MCG/ACT IN AERS
2.0000 | INHALATION_SPRAY | Freq: Four times a day (QID) | RESPIRATORY_TRACT | Status: DC | PRN
  Filled 2012-11-11: qty 6.7

## 2012-11-11 MED ORDER — METHYLPREDNISOLONE SODIUM SUCC 125 MG IJ SOLR
125.0000 mg | Freq: Once | INTRAMUSCULAR | Status: AC
  Administered 2012-11-11: 125 mg via INTRAVENOUS
  Filled 2012-11-11: qty 2

## 2012-11-11 MED ORDER — ISOSORBIDE MONONITRATE ER 60 MG PO TB24
60.0000 mg | ORAL_TABLET | Freq: Every day | ORAL | Status: DC
  Administered 2012-11-11 – 2012-11-15 (×5): 60 mg via ORAL
  Filled 2012-11-11 (×5): qty 1

## 2012-11-11 MED ORDER — FUROSEMIDE 10 MG/ML IJ SOLN
80.0000 mg | Freq: Three times a day (TID) | INTRAMUSCULAR | Status: DC
  Administered 2012-11-11 – 2012-11-13 (×5): 80 mg via INTRAVENOUS
  Filled 2012-11-11 (×8): qty 8

## 2012-11-11 MED ORDER — ASPIRIN EC 325 MG PO TBEC
325.0000 mg | DELAYED_RELEASE_TABLET | Freq: Every day | ORAL | Status: DC
  Administered 2012-11-11 – 2012-11-15 (×5): 325 mg via ORAL
  Filled 2012-11-11 (×7): qty 1

## 2012-11-11 MED ORDER — FUROSEMIDE 10 MG/ML IJ SOLN
40.0000 mg | Freq: Once | INTRAMUSCULAR | Status: AC
  Administered 2012-11-11: 40 mg via INTRAVENOUS

## 2012-11-11 MED ORDER — INSULIN ASPART 100 UNIT/ML ~~LOC~~ SOLN
13.0000 [IU] | Freq: Three times a day (TID) | SUBCUTANEOUS | Status: DC
  Administered 2012-11-11 – 2012-11-14 (×8): 13 [IU] via SUBCUTANEOUS

## 2012-11-11 MED ORDER — NITROGLYCERIN 0.4 MG SL SUBL: 0.4000 mg | SUBLINGUAL_TABLET | SUBLINGUAL | Status: DC | PRN

## 2012-11-11 MED ORDER — TRAMADOL HCL 50 MG PO TABS
50.0000 mg | ORAL_TABLET | Freq: Four times a day (QID) | ORAL | Status: DC | PRN
  Administered 2012-11-11 – 2012-11-14 (×3): 50 mg via ORAL
  Filled 2012-11-11 (×3): qty 1

## 2012-11-11 MED ORDER — HYDROCODONE-ACETAMINOPHEN 5-325 MG PO TABS
1.0000 | ORAL_TABLET | ORAL | Status: DC | PRN
  Administered 2012-11-11 – 2012-11-15 (×7): 1 via ORAL
  Filled 2012-11-11 (×7): qty 1

## 2012-11-11 MED ORDER — TIMOLOL MALEATE 0.25 % OP SOLN
1.0000 [drp] | Freq: Two times a day (BID) | OPHTHALMIC | Status: DC
  Administered 2012-11-11 – 2012-11-15 (×8): 1 [drp] via OPHTHALMIC
  Filled 2012-11-11: qty 5

## 2012-11-11 MED ORDER — CINACALCET HCL 30 MG PO TABS
30.0000 mg | ORAL_TABLET | Freq: Every day | ORAL | Status: DC
  Administered 2012-11-11 – 2012-11-15 (×5): 30 mg via ORAL
  Filled 2012-11-11 (×5): qty 1

## 2012-11-11 MED ORDER — SODIUM CHLORIDE 0.9 % IJ SOLN
3.0000 mL | Freq: Two times a day (BID) | INTRAMUSCULAR | Status: DC
  Administered 2012-11-11 – 2012-11-14 (×9): 3 mL via INTRAVENOUS

## 2012-11-11 MED ORDER — IPRATROPIUM BROMIDE 0.02 % IN SOLN
0.5000 mg | Freq: Once | RESPIRATORY_TRACT | Status: AC
  Administered 2012-11-11: 0.5 mg via RESPIRATORY_TRACT
  Filled 2012-11-11: qty 2.5

## 2012-11-11 MED ORDER — AZATHIOPRINE 50 MG PO TABS
50.0000 mg | ORAL_TABLET | Freq: Two times a day (BID) | ORAL | Status: DC
  Administered 2012-11-11 – 2012-11-15 (×9): 50 mg via ORAL
  Filled 2012-11-11 (×10): qty 1

## 2012-11-11 MED ORDER — INSULIN GLARGINE 100 UNIT/ML ~~LOC~~ SOLN
55.0000 [IU] | Freq: Every day | SUBCUTANEOUS | Status: DC
  Filled 2012-11-11: qty 0.55

## 2012-11-11 MED ORDER — FUROSEMIDE 10 MG/ML IJ SOLN
40.0000 mg | Freq: Two times a day (BID) | INTRAMUSCULAR | Status: DC
  Filled 2012-11-11 (×2): qty 4

## 2012-11-11 MED ORDER — AMLODIPINE BESYLATE 10 MG PO TABS
10.0000 mg | ORAL_TABLET | Freq: Every day | ORAL | Status: DC
  Administered 2012-11-11 – 2012-11-15 (×5): 10 mg via ORAL
  Filled 2012-11-11 (×5): qty 1

## 2012-11-11 MED ORDER — BRIMONIDINE TARTRATE 0.2 % OP SOLN
1.0000 [drp] | Freq: Two times a day (BID) | OPHTHALMIC | Status: DC
  Administered 2012-11-11 – 2012-11-15 (×9): 1 [drp] via OPHTHALMIC
  Filled 2012-11-11: qty 5

## 2012-11-11 MED ORDER — PREDNISONE 10 MG PO TABS
10.0000 mg | ORAL_TABLET | Freq: Every day | ORAL | Status: DC
  Administered 2012-11-11 – 2012-11-15 (×5): 10 mg via ORAL
  Filled 2012-11-11 (×6): qty 1

## 2012-11-11 MED ORDER — FUROSEMIDE 10 MG/ML IJ SOLN: 80.0000 mg | Freq: Two times a day (BID) | INTRAMUSCULAR | Status: DC

## 2012-11-11 MED ORDER — HEPARIN SODIUM (PORCINE) 5000 UNIT/ML IJ SOLN
5000.0000 [IU] | Freq: Three times a day (TID) | INTRAMUSCULAR | Status: DC
  Administered 2012-11-11 – 2012-11-15 (×12): 5000 [IU] via SUBCUTANEOUS
  Filled 2012-11-11 (×16): qty 1

## 2012-11-11 MED ORDER — MINOXIDIL 2.5 MG PO TABS
2.5000 mg | ORAL_TABLET | Freq: Two times a day (BID) | ORAL | Status: DC
  Administered 2012-11-11 – 2012-11-15 (×9): 2.5 mg via ORAL
  Filled 2012-11-11 (×10): qty 1

## 2012-11-11 MED ORDER — TACROLIMUS 1 MG PO CAPS
9.0000 mg | ORAL_CAPSULE | Freq: Two times a day (BID) | ORAL | Status: DC
  Administered 2012-11-11 – 2012-11-15 (×9): 9 mg via ORAL
  Filled 2012-11-11 (×10): qty 9

## 2012-11-11 MED ORDER — ALBUTEROL SULFATE (5 MG/ML) 0.5% IN NEBU
5.0000 mg | INHALATION_SOLUTION | Freq: Once | RESPIRATORY_TRACT | Status: AC
  Administered 2012-11-11: 5 mg via RESPIRATORY_TRACT
  Filled 2012-11-11: qty 1

## 2012-11-11 NOTE — Progress Notes (Signed)
Patient evaluated for community based chronic disease management services with [facility] Care Management Program as a benefit of patient's [employer]. Spoke with patient at bedside to explain [facility] Care Management services.  Patient admitted on 10.28.14 CHF.  She needs education on how to identify some early warning signs of fluid volume overload.  She also would benefit from dietary education regarding how to identify high sodium foods.  Her A1C on 08/28/12 was 9.0.  Her stated goal is 7.0 or lower per PCP request.  She has a scale and weighs daily, but does not have an action plan to reduce her risk of hospitalization.  Patient will receive a post discharge transition of care call and will be evaluated for monthly home visits for assessments and disease process education.  Left contact information and [facility] literature at bedside. Made Inpatient LCSW aware that [facility] Care Management following. Of note, [facility] Care Management services does not replace or interfere with any services that are arranged by inpatient case management or social work.  For additional questions or referrals please contact Anibal Henderson BSN RN [facility] Liaison at [phone].

## 2012-11-11 NOTE — ED Notes (Signed)
Pt asking for food 

## 2012-11-11 NOTE — ED Notes (Signed)
The pt has lasix ordered but the pt does not want the lasix because she had a kidney transplant and that is the only kidney she has .  She is fearful that the lasix will harm her kidneys.  Waiting to speak with the edp

## 2012-11-11 NOTE — Progress Notes (Signed)
Co-signed for LaTisha Teasley RN/BSN for assessments, IV assessments, medication administration, care plans, patient education, progress notes, and vital signs. Jeily Guthridge M, RN/BSN 

## 2012-11-11 NOTE — ED Notes (Addendum)
Report called to [phone] 

## 2012-11-11 NOTE — Progress Notes (Signed)
[facility] PROGRESS NOTE Interim History: 62 y.o. female with Past medical history of diastolic heart failure, diabetes mellitus, coronary artery disease, kidney transplant, TIA. She presented today with a complaint of shortness of breath that has been ongoing since last 3 days. Patient has recently undergone right lumpectomy 2 days ago. Before that she had a preoperative medical clearance by cardiology at which time she was found to have pericardial effusion for which no treatment change were done as the effusion was not significant. She mentions that she has been compliant with her Lasix dose of 160 mg twice a day and has been urinating her normal. Due to ongoing surgery she has been having poor oral intake. Despite that she has gained 8 pounds over last 2 days and feels that her abdomen is distended as well as her legs are swollen. She had shortness of breath which also progressed over last 3 days primarily associated with orthopnea  [facility] Weights   11/10/12 2115 11/11/12 0239  Weight: 90.521 kg (199 lb 9 oz) 89.812 kg (198 lb)        Intake/Output Summary (Last 24 hours) at 11/11/12 0801 Last data filed at 11/11/12 0450  Gross per 24 hour  Intake    240 ml  Output   1100 ml  Net   -860 ml     Assessment/Plan: Acute respiratory failure/Acute on chronic diastolic heart failure - Echo [phone] showed EF 60% with grade 1 diastolic heart failure. - estimated dry weight 180 kg. - Cont lasix IV, strict I and O's. Fluid restrict. - baseline Cr 1.3     Breast cancer of lower-outer quadrant of right female breast - Follow up with oncology.  Type 2 diabetes mellitus with circulatory disorder - cont lantus, plus SSI. - BG high due to solumedrol, now d/c.  HYPERTENSION Heart disease: - cont norvasc, lasix, Imdur metoprolol  KIDNEY TRANSPLANTATION - cont meds.    Code Status: Full  Family Communication: Family was present at bedside, opportunity was given to the family to ask  question and all questions were answered satisfactorily at the time of interview.  Disposition: Admitted to inpatient in telemetry.    Consultants:  none  Procedures: ECHO: none  Antibiotics:  none (indicate start date, and stop date if known)  HPI/Subjective: Relates her SOB is better.  Objective: Filed Vitals:   11/11/12 0048 11/11/12 0127 11/11/12 0141 11/11/12 0239  BP: 131/69  139/76 148/60  Pulse: 74  72 85  Temp:    98.4 F (36.9 C)  TempSrc:    Oral  Resp: 18     Height:    5\' 5"  (1.651 m)  Weight:    89.812 kg (198 lb)  SpO2: 98% 98% 100% 95%     Exam:  General: Alert, awake, oriented x3, in no acute distress.  HEENT: No bruits, no goiter. +JVD. Heart: Regular rate and rhythm, without murmurs, rubs, gallops.  Lungs: Good air movement, bilateral air movement.  Abdomen: Soft, nontender, nondistended, positive bowel sounds.  Neuro: Grossly intact, nonfocal.   Data Reviewed: Basic Metabolic Panel:  Recent Labs Lab 11/09/12 0938 11/10/12 2135 11/11/12 0610  NA 140 139 138  K 3.9 4.3 4.4  CL 98 99 98  CO2 30 31 29   GLUCOSE 221* 199* 413*  BUN 20 24* 24*  CREATININE 1.14* 1.44* 1.25*  CALCIUM 8.9 8.7 8.5   Liver Function Tests:  Recent Labs Lab 11/11/12 0610  AST 31  ALT 28  ALKPHOS 51  BILITOT 0.2*  PROT 7.2  ALBUMIN 3.8   No results found for this basename: LIPASE, AMYLASE,  in the last 168 hours No results found for this basename: AMMONIA,  in the last 168 hours CBC:  Recent Labs Lab 11/09/12 0938 11/10/12 2135 11/11/12 0610  WBC 6.0 6.2 6.1  HGB 12.4 11.7* 12.0  HCT 39.6 36.0 38.3  MCV 94.1 92.5 94.8  PLT 189 203 192   Cardiac Enzymes:  Recent Labs Lab 11/11/12 0610  TROPONINI <0.30   BNP (last 3 results)  Recent Labs  05/04/12 1311 11/10/12 2135 11/11/12 0610  PROBNP 4103.0* 2877.0* 2197.0*   CBG:  Recent Labs Lab 11/09/12 0955 11/09/12 1331 11/09/12 1539 11/11/12 0605  GLUCAP 205* 258* 239* 383*     No results found for this or any previous visit (from the past 240 hour(s)).   Studies: Dg Chest 2 View  11/10/2012   CLINICAL DATA:  Shortness of Breath  EXAM: CHEST  2 VIEW  COMPARISON:  May 04, 2012  FINDINGS: There is no edema or consolidation. There is diffuse enlargement of the cardiac silhouette. Pulmonary vascularity is normal. No adenopathy. No bone lesions.  IMPRESSION: Enlarged cardiac silhouette. Question pericardial effusion. No edema or consolidation.   Electronically Signed   By: Bretta Bang M.D.   On: 11/10/2012 21:46   Nm Sentinel Node Inj-no Rpt (breast)  11/09/2012   CLINICAL DATA: right breast cancer   Sulfur colloid was injected intradermally by the nuclear medicine  technologist for breast cancer sentinel node localization.     Scheduled Meds: . amLODipine  10 mg Oral Daily  . aspirin EC  325 mg Oral Daily  . azaTHIOprine  50 mg Oral BID  . brimonidine  1 drop Right Eye BID  . cinacalcet  30 mg Oral Daily  . furosemide  40 mg Intravenous BID  . heparin  5,000 Units Subcutaneous Q8H  . insulin aspart  0-15 Units Subcutaneous TID WC  . insulin aspart  0-5 Units Subcutaneous QHS  . insulin aspart  13 Units Subcutaneous TID WC  . insulin glargine  55 Units Subcutaneous QHS  . isosorbide mononitrate  60 mg Oral Daily  . metoprolol  100 mg Oral BID  . minoxidil  2.5 mg Oral BID  . pantoprazole  40 mg Oral Daily  . predniSONE  10 mg Oral Q breakfast  . sodium chloride  3 mL Intravenous Q12H  . tacrolimus  9 mg Oral BID  . timolol  1 drop Right Eye BID   Continuous Infusions:    Marinda Elk  [facility] Pager [phone]. If 8PM-8AM, please contact night-coverage at [email or web address], password [facility] 11/11/2012, 8:01 AM  LOS: 1 day

## 2012-11-11 NOTE — ED Provider Notes (Signed)
CSN: [id number]     Arrival date & time 11/10/12  2106 History   First MD Initiated Contact with Patient 11/10/12 2352     Chief Complaint  Patient presents with  . Shortness of Breath   (Consider location/radiation/quality/duration/timing/severity/associated sxs/prior Treatment) HPI Comments: Patient is a 62 year old female past medical history of congestive heart failure and renal transplant. She presents today with a several day history of worsening dyspnea on exertion and orthopnea. She denies any chest pain, fevers, productive cough. She is currently taking Lasix 80 mg twice daily and denies having missed any of these doses. She reports her ankles are swelling and has gained 10 pounds over the past 2 days.    She also recently underwent a breast biopsy and had preoperative medical clearance by cardiology. This revealed a pericardial effusion for which no treatment has been recommended.  Patient is a 62 y.o. female presenting with shortness of breath. The history is provided by the patient.  Shortness of Breath Severity:  Moderate Onset quality:  Gradual Duration:  3 days Timing:  Constant Progression:  Worsening Chronicity:  New Context: activity   Relieved by:  Nothing Worsened by:  Activity and exertion (Lying flat) Associated symptoms: no abdominal pain, no chest pain, no cough and no fever     Past Medical History  Diagnosis Date  . CHF (congestive heart failure)   . Diabetes mellitus   . Acid reflux   . S/P kidney transplant   . CAD (coronary artery disease)     native vessel  . GERD (gastroesophageal reflux disease)   . Diastolic dysfunction   . Hyperlipidemia   . DJD (degenerative joint disease)   . TIA (transient ischemic attack)   . Hx of colonic polyps   . PUD (peptic ulcer disease)   . Diabetic retinopathy   . Heart murmur   . Hemorrhoids   . Generalized headaches   . Hypertension   . Asthma   . MI (myocardial infarction) 2011  . Kidney Blow   .  Nephrolithiasis   . Mental disorder   . Stroke 2008    no deficits  . Anemia     Hx-not current  . Breast cancer   . Blind left eye    Past Surgical History  Procedure Laterality Date  . Transplantation renal  05/2006  . Cardiac catheterization  2011  . Abdominal hysterectomy    . Cataract extraction  bil  . Intestinal perforation surgery  01/2009  . Breast biopsy  2010  . Gallstones removed    . Cholecystectomy  2008  . Colon surgery  2013    polyps removed  . Hemorrhoid surgery  01/23/2012    Procedure: HEMORRHOIDECTOMY PROLAPSED;  Surgeon: Romie Levee, MD;  Location: [facility];  Service: General;  Laterality: N/A;  . Eye surgery Left 1997  . Foot surgery Right 2013  . Breast lumpectomy with sentinel lymph node biopsy Right 11/09/2012    Procedure: RIGHT BREAST LUMPECTOMY WITH SENTINEL LYMPH NODE REMOVAL;  Surgeon: Currie Paris, MD;  Location: [facility] OR;  Service: General;  Laterality: Right;   Family History  Problem Relation Age of Onset  . Kidney disease Neg Hx   . Arthritis Other     parent  . Heart disease Other     parent  . Hypertension Other     parent  . Diabetes Other     parent  . Diabetes Other     grandparent  . Thyroid disease  Other     several  . Heart disease Father    History  Substance Use Topics  . Smoking status: Never Smoker   . Smokeless tobacco: Never Used  . Alcohol Use: No   OB History   Grav Para Term Preterm Abortions TAB SAB Ect Mult Living                 Review of Systems  Constitutional: Negative for fever.  Respiratory: Positive for shortness of breath. Negative for cough.   Cardiovascular: Negative for chest pain.  Gastrointestinal: Negative for abdominal pain.  All other systems reviewed and are negative.    Allergies  Cephalexin and Propoxyphene-acetaminophen  Home Medications   Current Outpatient Rx  Name  Route  Sig  Dispense  Refill  . albuterol (ACCUNEB) 0.63 MG/3ML nebulizer solution    Nebulization   Take 3 mLs (0.63 mg total) by nebulization every 6 (six) hours as needed for wheezing.   75 mL   12     Diagnosis Code 493.90   . albuterol (PROVENTIL HFA;VENTOLIN HFA) 108 (90 BASE) MCG/ACT inhaler   Inhalation   Inhale 2 puffs into the lungs every 6 (six) hours as needed for wheezing.         Marland Kitchen amLODipine (NORVASC) 10 MG tablet   Oral   Take 1 tablet (10 mg total) by mouth daily.   90 tablet   3   . aspirin EC 325 MG tablet   Oral   Take 325 mg by mouth daily.           Marland Kitchen azaTHIOprine (IMURAN) 50 MG tablet   Oral   Take 50 mg by mouth 2 (two) times daily.           . brimonidine (ALPHAGAN) 0.2 % ophthalmic solution   Right Eye   Place 1 drop into the right eye 2 (two) times daily.         . cinacalcet (SENSIPAR) 30 MG tablet   Oral   Take 1 tablet (30 mg total) by mouth daily.   30 tablet   11   . docusate sodium (COLACE) 100 MG capsule   Oral   Take 1 capsule (100 mg total) by mouth 2 (two) times daily.   60 capsule   0   . esomeprazole (NEXIUM) 40 MG capsule   Oral   Take 40 mg by mouth daily before breakfast.         . furosemide (LASIX) 80 MG tablet   Oral   Take 160 mg by mouth 2 (two) times daily.          Marland Kitchen HYDROcodone-acetaminophen (NORCO) 5-325 MG per tablet   Oral   Take 1 tablet by mouth every 4 (four) hours as needed for pain.   30 tablet   0   . insulin aspart (NOVOLOG) 100 UNIT/ML injection   Subcutaneous   Inject 13-16 Units into the skin 3 (three) times daily. Injects 13 units daily at breakfast, 13 units daily at lunch, and 16 units daily at supper.         . insulin glargine (LANTUS) 100 UNIT/ML injection   Subcutaneous   Inject 55 Units into the skin at bedtime.         . isosorbide mononitrate (IMDUR) 60 MG 24 hr tablet   Oral   Take 60 mg by mouth daily.         . Magnesium Oxide 250 MG TABS   Oral  Take 250 mg by mouth daily.          . metoprolol (LOPRESSOR) 100 MG tablet   Oral    Take 100 mg by mouth 2 (two) times daily.         . minoxidil (LONITEN) 2.5 MG tablet   Oral   Take 2.5 mg by mouth 2 (two) times daily.         . Multiple Vitamin (MULTIVITAMIN) capsule   Oral   Take 1 capsule by mouth daily.           . nitroGLYCERIN (NITROSTAT) 0.4 MG SL tablet   Sublingual   Place 1 tablet (0.4 mg total) under the tongue every 5 (five) minutes as needed for chest pain.   25 tablet   11   . polyethylene glycol (MIRALAX / GLYCOLAX) packet   Oral   Take 17 g by mouth daily.         . potassium chloride (KLOR-CON 10) 10 MEQ tablet   Oral   Take 1 tablet (10 mEq total) by mouth 2 (two) times daily.   90 tablet   3   . pravastatin (PRAVACHOL) 20 MG tablet   Oral   Take 10 mg by mouth at bedtime.           . prednisoLONE acetate (PRED FORTE) 1 % ophthalmic suspension   Left Eye   Place 1 drop into the left eye 3 (three) times daily.         . predniSONE (DELTASONE) 10 MG tablet   Oral   Take 10 mg by mouth daily.         . tacrolimus (PROGRAF) 1 MG capsule   Oral   Take 9 mg by mouth 2 (two) times daily.          . timolol (TIMOPTIC) 0.25 % ophthalmic solution   Right Eye   Place 1 drop into the right eye 2 (two) times daily.         . traMADol (ULTRAM) 50 MG tablet   Oral   Take 50 mg by mouth every 6 (six) hours as needed for pain.          BP 121/63  Pulse 72  Temp(Src) 98.5 F (36.9 C) (Oral)  Resp 18  Wt 199 lb 9 oz (90.521 kg)  BMI 33.21 kg/m2  SpO2 100% Physical Exam  Nursing note and vitals reviewed. Constitutional: She is oriented to person, place, and time. She appears well-developed and well-nourished. No distress.  HENT:  Head: Normocephalic and atraumatic.  Neck: Normal range of motion. Neck supple.  Cardiovascular: Normal rate and regular rhythm.  Exam reveals no gallop and no friction rub.   No murmur heard. Pulmonary/Chest: Effort normal. No respiratory distress. She has no wheezes. She has rales.   There are slight rales in the bases bilaterally.  Abdominal: Soft. Bowel sounds are normal. She exhibits no distension. There is no tenderness.  Musculoskeletal: Normal range of motion. She exhibits edema.  There is 1+ edema in the bilateral lower extremities  Neurological: She is alert and oriented to person, place, and time.  Skin: Skin is warm and dry. She is not diaphoretic.    ED Course  Procedures (including critical care time) Labs Review Labs Reviewed  BASIC METABOLIC PANEL - Abnormal; Notable for the following:    Glucose, Bld 199 (*)    BUN 24 (*)    Creatinine, Ser 1.44 (*)    GFR calc non Af  Amer 38 (*)    GFR calc Af Amer 44 (*)    All other components within normal limits  CBC - Abnormal; Notable for the following:    Hemoglobin 11.7 (*)    All other components within normal limits  PRO B NATRIURETIC PEPTIDE - Abnormal; Notable for the following:    Pro B Natriuretic peptide (BNP) 2877.0 (*)    All other components within normal limits  POCT I-STAT TROPONIN I   Imaging Review Dg Chest 2 View  11/10/2012   CLINICAL DATA:  Shortness of Breath  EXAM: CHEST  2 VIEW  COMPARISON:  May 04, 2012  FINDINGS: There is no edema or consolidation. There is diffuse enlargement of the cardiac silhouette. Pulmonary vascularity is normal. No adenopathy. No bone lesions.  IMPRESSION: Enlarged cardiac silhouette. Question pericardial effusion. No edema or consolidation.   Electronically Signed   By: Bretta Bang M.D.   On: 11/10/2012 21:46   Nm Sentinel Node Inj-no Rpt (breast)  11/09/2012   CLINICAL DATA: right breast cancer   Sulfur colloid was injected intradermally by the nuclear medicine  technologist for breast cancer sentinel node localization.     EKG Interpretation   None       Date: 11/11/2012  Rate: 76  Rhythm: normal sinus rhythm  QRS Axis: right  Intervals: normal  ST/T Wave abnormalities: nonspecific T wave changes  Conduction Disutrbances:none   Narrative Interpretation:   Old EKG Reviewed: unchanged    MDM  No diagnosis found. Patient is a 62 year old female presents with shortness of breath and weight gain over the past several days. She has a history of CHF and feels as though she is retaining fluid. She is given 40 of IV Lasix in the ER. Laboratory studies returned with a BNP of 2900 which is well above her baseline, although her chest x-ray does not reveal pulmonary edema. I've spoken with [facility] who agrees to admit the patient to    Geoffery Lyons, MD 11/11/12 0126

## 2012-11-11 NOTE — ED Notes (Signed)
Admitting doctor at the bedside 

## 2012-11-11 NOTE — Progress Notes (Signed)
Quick Note:  Tell the patient that her margins are OK and her lymph nodes are negative. I will discuss in detail in the office. ______

## 2012-11-11 NOTE — Progress Notes (Signed)
Utilization Review Completed.[name] T10/29/2014  

## 2012-11-11 NOTE — Progress Notes (Signed)
Report given to receiving RN. Patient is in stable condition. No signs or symptoms of distress or discomfort. No verbal complaints.

## 2012-11-11 NOTE — H&P (Signed)
[facility] History and Physical  Patient: Monica ANDRZEJEWSKI  NWG:[id number]  DOB: 02-Nov-1950  DOA: 11/10/2012  Referring physician: Dr. Judd Lien PCP: Oliver Barre, MD  Consults:     Chief Complaint: Shortness of breath and weight gain  HPI: Monica Lee is a 62 y.o. female with Past medical history of diastolic heart failure, diabetes mellitus, coronary artery disease, kidney transplant, TIA. She presented today with a complaint of shortness of breath that has been ongoing since last 3 days. Patient has recently undergone right lumpectomy 2 days ago. Before that she had a preoperative medical clearance by cardiology at which time she was found to have pericardial effusion for which no treatment change were done as the effusion was not significant. She mentions that she has been compliant with her Lasix dose of 160 mg twice a day and has been urinating her normal. Due to ongoing surgery she has been having poor oral intake. Despite that she has gained 8 pounds over last 2 days and feels that her abdomen is distended as well as her legs are swollen. She had shortness of breath which also progressed over last 3 days primarily associated with orthopnea. She denies any headache, dizziness, pain in her extremities, diarrhea, constipation, urinary symptoms, nausea or vomiting.  Review of Systems: as mentioned in the history of present illness.  A Comprehensive review of the other systems is negative.  Past Medical History  Diagnosis Date  . CHF (congestive heart failure)   . Diabetes mellitus   . Acid reflux   . S/P kidney transplant   . CAD (coronary artery disease)     native vessel  . GERD (gastroesophageal reflux disease)   . Diastolic dysfunction   . Hyperlipidemia   . DJD (degenerative joint disease)   . TIA (transient ischemic attack)   . Hx of colonic polyps   . PUD (peptic ulcer disease)   . Diabetic retinopathy   . Heart murmur   . Hemorrhoids   . Generalized headaches   .  Hypertension   . Asthma   . MI (myocardial infarction) 2011  . Kidney Fauth   . Nephrolithiasis   . Mental disorder   . Stroke 2008    no deficits  . Anemia     Hx-not current  . Breast cancer   . Blind left eye    Past Surgical History  Procedure Laterality Date  . Transplantation renal  05/2006  . Cardiac catheterization  2011  . Abdominal hysterectomy    . Cataract extraction  bil  . Intestinal perforation surgery  01/2009  . Breast biopsy  2010  . Gallstones removed    . Cholecystectomy  2008  . Colon surgery  2013    polyps removed  . Hemorrhoid surgery  01/23/2012    Procedure: HEMORRHOIDECTOMY PROLAPSED;  Surgeon: Romie Levee, MD;  Location: [facility];  Service: General;  Laterality: N/A;  . Eye surgery Left 1997  . Foot surgery Right 2013  . Breast lumpectomy with sentinel lymph node biopsy Right 11/09/2012    Procedure: RIGHT BREAST LUMPECTOMY WITH SENTINEL LYMPH NODE REMOVAL;  Surgeon: Currie Paris, MD;  Location: [facility] OR;  Service: General;  Laterality: Right;   Social History:  reports that she has never smoked. She has never used smokeless tobacco. She reports that she does not drink alcohol or use illicit drugs. Patient is coming from home. Independent for most of her  ADL.  Allergies  Allergen Reactions  . Cephalexin  Swelling    Swelling to face, chills  . Propoxyphene-Acetaminophen Hives and Itching    Family History  Problem Relation Age of Onset  . Kidney disease Neg Hx   . Arthritis Other     parent  . Heart disease Other     parent  . Hypertension Other     parent  . Diabetes Other     parent  . Diabetes Other     grandparent  . Thyroid disease Other     several  . Heart disease Father     Prior to Admission medications   Medication Sig Start Date End Date Taking? Authorizing Provider  albuterol (ACCUNEB) 0.63 MG/3ML nebulizer solution Take 3 mLs (0.63 mg total) by nebulization every 6 (six) hours as needed for  wheezing. 10/22/12  Yes Corwin Levins, MD  albuterol (PROVENTIL HFA;VENTOLIN HFA) 108 (90 BASE) MCG/ACT inhaler Inhale 2 puffs into the lungs every 6 (six) hours as needed for wheezing.   Yes Historical Provider, MD  amLODipine (NORVASC) 10 MG tablet Take 1 tablet (10 mg total) by mouth daily. 09/28/12  Yes Corwin Levins, MD  aspirin EC 325 MG tablet Take 325 mg by mouth daily.     Yes Historical Provider, MD  azaTHIOprine (IMURAN) 50 MG tablet Take 50 mg by mouth 2 (two) times daily.     Yes Historical Provider, MD  brimonidine (ALPHAGAN) 0.2 % ophthalmic solution Place 1 drop into the right eye 2 (two) times daily.   Yes Historical Provider, MD  cinacalcet (SENSIPAR) 30 MG tablet Take 1 tablet (30 mg total) by mouth daily. 04/09/12  Yes Corwin Levins, MD  docusate sodium (COLACE) 100 MG capsule Take 1 capsule (100 mg total) by mouth 2 (two) times daily. 01/23/12  Yes Romie Levee, MD  esomeprazole (NEXIUM) 40 MG capsule Take 40 mg by mouth daily before breakfast.   Yes Historical Provider, MD  furosemide (LASIX) 80 MG tablet Take 160 mg by mouth 2 (two) times daily.    Yes Historical Provider, MD  HYDROcodone-acetaminophen (NORCO) 5-325 MG per tablet Take 1 tablet by mouth every 4 (four) hours as needed for pain. 11/09/12  Yes Currie Paris, MD  insulin aspart (NOVOLOG) 100 UNIT/ML injection Inject 13-16 Units into the skin 3 (three) times daily. Injects 13 units daily at breakfast, 13 units daily at lunch, and 16 units daily at supper.   Yes Historical Provider, MD  insulin glargine (LANTUS) 100 UNIT/ML injection Inject 55 Units into the skin at bedtime. 04/28/12 04/28/13 Yes Carlus Pavlov, MD  isosorbide mononitrate (IMDUR) 60 MG 24 hr tablet Take 60 mg by mouth daily.   Yes Historical Provider, MD  Magnesium Oxide 250 MG TABS Take 250 mg by mouth daily.    Yes Historical Provider, MD  metoprolol (LOPRESSOR) 100 MG tablet Take 100 mg by mouth 2 (two) times daily. 08/19/12  Yes Gaylord Shih, MD   minoxidil (LONITEN) 2.5 MG tablet Take 2.5 mg by mouth 2 (two) times daily.   Yes Historical Provider, MD  Multiple Vitamin (MULTIVITAMIN) capsule Take 1 capsule by mouth daily.     Yes Historical Provider, MD  nitroGLYCERIN (NITROSTAT) 0.4 MG SL tablet Place 1 tablet (0.4 mg total) under the tongue every 5 (five) minutes as needed for chest pain. 09/30/11 03/22/13 Yes Gaylord Shih, MD  polyethylene glycol (MIRALAX / GLYCOLAX) packet Take 17 g by mouth daily.   Yes Historical Provider, MD  potassium chloride (KLOR-CON 10) 10 MEQ  tablet Take 1 tablet (10 mEq total) by mouth 2 (two) times daily. 04/09/12  Yes Corwin Levins, MD  pravastatin (PRAVACHOL) 20 MG tablet Take 10 mg by mouth at bedtime.     Yes Historical Provider, MD  prednisoLONE acetate (PRED FORTE) 1 % ophthalmic suspension Place 1 drop into the left eye 3 (three) times daily.   Yes Historical Provider, MD  predniSONE (DELTASONE) 10 MG tablet Take 10 mg by mouth daily.   Yes Historical Provider, MD  tacrolimus (PROGRAF) 1 MG capsule Take 9 mg by mouth 2 (two) times daily.    Yes Historical Provider, MD  timolol (TIMOPTIC) 0.25 % ophthalmic solution Place 1 drop into the right eye 2 (two) times daily.   Yes Historical Provider, MD  traMADol (ULTRAM) 50 MG tablet Take 50 mg by mouth every 6 (six) hours as needed for pain.   Yes Historical Provider, MD    Physical Exam: Filed Vitals:   11/11/12 0048 11/11/12 0127 11/11/12 0141 11/11/12 0239  BP: 131/69  139/76 148/60  Pulse: 74  72 85  Temp:    98.4 F (36.9 C)  TempSrc:    Oral  Resp: 18     Height:    5\' 5"  (1.651 m)  Weight:    89.812 kg (198 lb)  SpO2: 98% 98% 100% 95%    General: Alert, Awake and Oriented to Time, Place and Person. Appear in mild distress Eyes: PERRL ENT: Oral Mucosa clear moist. Neck: Difficult to assess JVD, no Carotid Bruits  Cardiovascular: S1 and S2 Present, no Murmur, Peripheral Pulses Present Respiratory: Bilateral Air entry equal and Decreased,  Clear to Auscultation,  No Crackles, no wheezes Abdomen: Bowel Sound Present, Soft and non tender, distended Skin: No Rash Extremities: Bilateral Pedal edema, no calf tenderness Neurologic: Grossly Unremarkable.  Labs on Admission:  CBC:  Recent Labs Lab 11/09/12 0938 11/10/12 2135  WBC 6.0 6.2  HGB 12.4 11.7*  HCT 39.6 36.0  MCV 94.1 92.5  PLT 189 203    CMP     Component Value Date/Time   NA 139 11/10/2012 2135   NA 140 10/14/2012 0825   K 4.3 11/10/2012 2135   K 4.1 10/14/2012 0825   CL 99 11/10/2012 2135   CO2 31 11/10/2012 2135   CO2 31* 10/14/2012 0825   GLUCOSE 199* 11/10/2012 2135   GLUCOSE 296* 10/14/2012 0825   BUN 24* 11/10/2012 2135   BUN 27.3* 10/14/2012 0825   CREATININE 1.44* 11/10/2012 2135   CREATININE 1.9* 10/14/2012 0825   CALCIUM 8.7 11/10/2012 2135   CALCIUM 9.0 10/14/2012 0825   PROT 6.9 10/14/2012 0825   PROT 7.0 05/04/2012 1311   ALBUMIN 3.7 10/14/2012 0825   ALBUMIN 3.7 05/04/2012 1311   AST 25 10/14/2012 0825   AST 23 05/04/2012 1311   ALT 12 10/14/2012 0825   ALT 12 05/04/2012 1311   ALKPHOS 42 10/14/2012 0825   ALKPHOS 40 05/04/2012 1311   BILITOT 0.28 10/14/2012 0825   BILITOT 0.2* 05/04/2012 1311   GFRNONAA 38* 11/10/2012 2135   GFRAA 44* 11/10/2012 2135    No results found for this basename: LIPASE, AMYLASE,  in the last 168 hours No results found for this basename: AMMONIA,  in the last 168 hours  Cardiac Enzymes: No results found for this basename: CKTOTAL, CKMB, CKMBINDEX, TROPONINI,  in the last 168 hours  BNP (last 3 results)  Recent Labs  05/04/12 1311 11/10/12 2135  PROBNP 4103.0* 2877.0*    Radiological  Exams on Admission: Dg Chest 2 View  11/10/2012   CLINICAL DATA:  Shortness of Breath  EXAM: CHEST  2 VIEW  COMPARISON:  May 04, 2012  FINDINGS: There is no edema or consolidation. There is diffuse enlargement of the cardiac silhouette. Pulmonary vascularity is normal. No adenopathy. No bone lesions.  IMPRESSION: Enlarged  cardiac silhouette. Question pericardial effusion. No edema or consolidation.   Electronically Signed   By: Bretta Bang M.D.   On: 11/10/2012 21:46   Nm Sentinel Node Inj-no Rpt (breast)  11/09/2012   CLINICAL DATA: right breast cancer   Sulfur colloid was injected intradermally by the nuclear medicine  technologist for breast cancer sentinel node localization.     EKG: Independently reviewed. nonspecific ST and T waves changes.  Assessment/Plan Principal Problem:   Acute on chronic diastolic heart failure Active Problems:   Type 2 diabetes mellitus with circulatory disorder   HYPERTENSION   CORONARY ARTERY DISEASE   Chronic diastolic heart failure   KIDNEY TRANSPLANTATION   Breast cancer of lower-outer quadrant of right female breast   1. Acute on chronic diastolic heart failure The patient presented to shortness of breath, has received one dose of IV Lasix in the ED, and mentions that currently she is feeling better after responding to it. At present she does not have any crackles but she is still requiring oxygen to maintain normal saturations and appears in mild respiratory distress. Therefore we would admit her to the hospital for further management of her heart failure. Patient will require further doses of Lasix but at present she is condition about her kidney function due to her history of renal transplant therefore we will repeat another dose M.D. in the morning and if her serum creatinine is improving with hydration then they would continue with hydration or as he may require further input from nephrology. Her blood pressure appears to be stable at present therefore I would continue her home dose of antihypertensives.  2. Diabetes mellitus Continue home dose of insulin and placed on sliding scale  3. Renal transplant Continue oral prednisone and azathioprine as well as Prograf at home dose.  4. Pain management Continue Norco and tramadol  DVT Prophylaxis:  subcutaneous Heparin Nutrition: Diabetic and cardiac renal diet  Code Status: Full  Family Communication: Family was present at bedside, opportunity was given to the family to ask question and all questions were answered satisfactorily at the time of interview. Disposition: Admitted to inpatient in telemetry.  Author: Lynden Oxford, MD [facility] Pager: [phone] 11/11/2012, 3:53 AM    If 7PM-7AM, please contact night-coverage [email or web address] Password [email or web address]

## 2012-11-12 ENCOUNTER — Telehealth (INDEPENDENT_AMBULATORY_CARE_PROVIDER_SITE_OTHER): Payer: Self-pay | Admitting: General Surgery

## 2012-11-12 ENCOUNTER — Encounter (HOSPITAL_COMMUNITY): Payer: Self-pay | Admitting: General Practice

## 2012-11-12 LAB — T4, FREE: Free T4: 0.34 ng/dL — ABNORMAL LOW (ref 0.80–1.80)

## 2012-11-12 LAB — GLUCOSE, CAPILLARY
Glucose-Capillary: 165 mg/dL — ABNORMAL HIGH (ref 70–99)
Glucose-Capillary: 105 mg/dL — ABNORMAL HIGH (ref 70–99)
Glucose-Capillary: 214 mg/dL — ABNORMAL HIGH (ref 70–99)
Glucose-Capillary: 187 mg/dL — ABNORMAL HIGH (ref 70–99)

## 2012-11-12 LAB — BASIC METABOLIC PANEL
BUN: 25 mg/dL — ABNORMAL HIGH (ref 6–23)
CO2: 31 mEq/L (ref 19–32)
Chloride: 100 mEq/L (ref 96–112)
Creatinine, Ser: 1.22 mg/dL — ABNORMAL HIGH (ref 0.50–1.10)
GFR calc Af Amer: 54 mL/min — ABNORMAL LOW (ref >90)
Glucose, Bld: 243 mg/dL — ABNORMAL HIGH (ref 70–99)
Sodium: 141 mEq/L (ref 135–145)

## 2012-11-12 LAB — TSH: TSH: 40.047 u[IU]/mL — ABNORMAL HIGH (ref 0.350–4.500)

## 2012-11-12 MED ORDER — INSULIN GLARGINE 100 UNIT/ML ~~LOC~~ SOLN
40.0000 [IU] | Freq: Two times a day (BID) | SUBCUTANEOUS | Status: DC
  Administered 2012-11-12 – 2012-11-14 (×6): 40 [IU] via SUBCUTANEOUS
  Filled 2012-11-12 (×9): qty 0.4

## 2012-11-12 MED ORDER — INSULIN GLARGINE 100 UNIT/ML ~~LOC~~ SOLN
40.0000 [IU] | Freq: Two times a day (BID) | SUBCUTANEOUS | Status: DC
  Filled 2012-11-12: qty 0.4

## 2012-11-12 MED ORDER — DOCUSATE SODIUM 100 MG PO CAPS
100.0000 mg | ORAL_CAPSULE | Freq: Two times a day (BID) | ORAL | Status: DC
  Administered 2012-11-12 (×2): 100 mg via ORAL
  Filled 2012-11-12 (×4): qty 1

## 2012-11-12 NOTE — Telephone Encounter (Signed)
Left message on machine for patient to call back for path results. Margins OK, Lymph nodes negative. Awaiting call back.

## 2012-11-12 NOTE — Progress Notes (Signed)
Co-signed for LaTisha Teasley RN/BSN for assessments, IV assessments, medication administration, care plans, patient education, progress notes, and vital signs. Vernon Ariel M, RN/BSN 

## 2012-11-12 NOTE — Progress Notes (Signed)
Inpatient Diabetes Program Recommendations  AACE/ADA: New Consensus Statement on Inpatient Glycemic Control (2013)  Target Ranges:  Prepandial:   less than 140 mg/dL      Peak postprandial:   less than 180 mg/dL (1-2 hours)      Critically ill patients:  140 - 180 mg/dL   Increase in lantus dose again by 20 units per 24 hrs.  Would recommend no more than 10 units incremental increase.  Inpatient Diabetes Program Recommendations Insulin - Basal: Would not recommend increase in lantus again.  Fasting glucose this am at 165 mg/dL. Lantus increased by a total of 20 units per day. May risk fasting hypoglycemia. Pt akes total of 55 units at home Insulin - Meal Coverage: May need some meal coverage instead as pt takes 13-13-16 units for BF, L, and S at home.  Thank you, Lenor Coffin, RN, CNS, Diabetes Coordinator [phone])

## 2012-11-12 NOTE — Progress Notes (Signed)
Pt. Stable and currently showing no signs of distress and denied any pain at this time. 

## 2012-11-12 NOTE — Progress Notes (Addendum)
[facility] PROGRESS NOTE Interim History: 62 y.o. female with Past medical history of diastolic heart failure, diabetes mellitus, coronary artery disease, kidney transplant, TIA. She presented today with a complaint of shortness of breath that has been ongoing since last 3 days. Patient has recently undergone right lumpectomy 2 days ago. Before that she had a preoperative medical clearance by cardiology at which time she was found to have pericardial effusion for which no treatment change were done as the effusion was not significant. She mentions that she has been compliant with her Lasix dose of 160 mg twice a day and has been urinating her normal. Due to ongoing surgery she has been having poor oral intake. Despite that she has gained 8 pounds over last 2 days and feels that her abdomen is distended as well as her legs are swollen. She had shortness of breath which also progressed over last 3 days primarily associated with orthopnea  Filed Weights   11/10/12 2115 11/11/12 0239 11/12/12 0609  Weight: 90.521 kg (199 lb 9 oz) 89.812 kg (198 lb) 88.633 kg (195 lb 6.4 oz)        Intake/Output Summary (Last 24 hours) at 11/12/12 1007 Last data filed at 11/12/12 0840  Gross per 24 hour  Intake   1192 ml  Output    600 ml  Net    592 ml     Assessment/Plan: Acute respiratory failure/Acute on chronic diastolic heart failure - Echo [id number] showed EF 60% with grade 1 diastolic heart failure. - Estimated dry weight 180 kg. - Cont current lasix IV, strict I and O's. Fluid restrict. Good urine out put. - baseline Cr 1.3 - TSH high, repeat TSH and free T4.     Breast cancer of lower-outer quadrant of right female breast - Follow up with oncology.  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 - BG high due to solumedrol, now d/c.  HYPERTENSION Heart disease: - cont norvasc, lasix, Imdur metoprolol  KIDNEY  TRANSPLANTATION - cont meds.    Code Status: Full  Family Communication: Family was present at bedside, opportunity was given to the family to ask question and all questions were answered satisfactorily at the time of interview.  Disposition: Admitted to inpatient in telemetry.    Consultants:  none  Procedures: ECHO: ejection fraction was in the range of 60% to 65%. Regional wall motion abnormalities cannot be excluded. Doppler parameters are consistent with abnormal left ventricular relaxation (grade 1 diastolic dysfunction).  Antibiotics:  none   HPI/Subjective: No complains.  Objective: Filed Vitals:   11/11/12 1338 11/11/12 2038 11/12/12 0113 11/12/12 0609  BP: 122/61 123/59 138/61 137/59  Pulse: 69 67 66 62  Temp: 98 F (36.7 C) 98.2 F (36.8 C) 98.6 F (37 C) 98.1 F (36.7 C)  TempSrc: Oral Oral Oral Oral  Resp: 18 18 20 18   Height:      Weight:    88.633 kg (195 lb 6.4 oz)  SpO2: 100% 95% 97% 99%     Exam:  General: Alert, awake, oriented x3, in no acute distress.  HEENT: No bruits, no goiter. +JVD. Heart: Regular rate and rhythm, without murmurs, rubs, gallops.  Lungs: Good air movement, bilateral air movement.  Abdomen: Soft, nontender, nondistended, positive bowel sounds.  Neuro: Grossly intact, nonfocal.   Data Reviewed: Basic Metabolic Panel:  Recent Labs Lab 11/09/12 0938 11/10/12 2135 11/11/12 0610 11/12/12 0424  NA 140 139 138 141  K 3.9  4.3 4.4 3.9  CL 98 99 98 100  CO2 30 31 29 31   GLUCOSE 221* 199* 413* 243*  BUN 20 24* 24* 25*  CREATININE 1.14* 1.44* 1.25* 1.22*  CALCIUM 8.9 8.7 8.5 8.7   Liver Function Tests:  Recent Labs Lab 11/11/12 0610  AST 31  ALT 28  ALKPHOS 51  BILITOT 0.2*  PROT 7.2  ALBUMIN 3.8   No results found for this basename: LIPASE, AMYLASE,  in the last 168 hours No results found for this basename: AMMONIA,  in the last 168 hours CBC:  Recent Labs Lab 11/09/12 0938 11/10/12 2135  11/11/12 0610  WBC 6.0 6.2 6.1  HGB 12.4 11.7* 12.0  HCT 39.6 36.0 38.3  MCV 94.1 92.5 94.8  PLT 189 203 192   Cardiac Enzymes:  Recent Labs Lab 11/11/12 0610  TROPONINI <0.30   BNP (last 3 results)  Recent Labs  05/04/12 1311 11/10/12 2135 11/11/12 0610  PROBNP 4103.0* 2877.0* 2197.0*   CBG:  Recent Labs Lab 11/11/12 0605 11/11/12 1128 11/11/12 1558 11/11/12 2109 11/12/12 0724  GLUCAP 383* 162* 282* 181* 165*    No results found for this or any previous visit (from the past 240 hour(s)).   Studies: Dg Chest 2 View  11/10/2012   CLINICAL DATA:  Shortness of Breath  EXAM: CHEST  2 VIEW  COMPARISON:  May 04, 2012  FINDINGS: There is no edema or consolidation. There is diffuse enlargement of the cardiac silhouette. Pulmonary vascularity is normal. No adenopathy. No bone lesions.  IMPRESSION: Enlarged cardiac silhouette. Question pericardial effusion. No edema or consolidation.   Electronically Signed   By: Bretta Bang M.D.   On: 11/10/2012 21:46    Scheduled Meds: . amLODipine  10 mg Oral Daily  . aspirin EC  325 mg Oral Daily  . azaTHIOprine  50 mg Oral BID  . brimonidine  1 drop Right Eye BID  . cinacalcet  30 mg Oral Daily  . furosemide  80 mg Intravenous Q8H  . heparin  5,000 Units Subcutaneous Q8H  . insulin aspart  0-15 Units Subcutaneous TID WC  . insulin aspart  0-5 Units Subcutaneous QHS  . insulin aspart  13 Units Subcutaneous TID WC  . insulin glargine  30 Units Subcutaneous BID  . isosorbide mononitrate  60 mg Oral Daily  . metoprolol  100 mg Oral BID  . minoxidil  2.5 mg Oral BID  . pantoprazole  40 mg Oral Daily  . predniSONE  10 mg Oral Q breakfast  . sodium chloride  3 mL Intravenous Q12H  . tacrolimus  9 mg Oral BID  . timolol  1 drop Right Eye BID   Continuous Infusions:    Marinda Elk  [facility] Pager [phone]. If 8PM-8AM, please contact night-coverage at [email or web address], password [facility] 11/12/2012, 10:07 AM   LOS: 2 days

## 2012-11-12 NOTE — Telephone Encounter (Signed)
Message copied by Liliana Cline on Thu Nov 12, 2012  9:26 AM ------      Message from: Currie Paris      Created: Wed Nov 11, 2012  1:37 PM       Tell the patient that her margins are OK and her lymph nodes are negative. I will discuss in detail in the office. ------

## 2012-11-13 ENCOUNTER — Telehealth: Payer: Self-pay | Admitting: *Deleted

## 2012-11-13 DIAGNOSIS — E039 Hypothyroidism, unspecified: Secondary | ICD-10-CM

## 2012-11-13 LAB — BASIC METABOLIC PANEL
GFR calc Af Amer: 54 mL/min — ABNORMAL LOW (ref >90)
GFR calc non Af Amer: 47 mL/min — ABNORMAL LOW (ref >90)
Glucose, Bld: 115 mg/dL — ABNORMAL HIGH (ref 70–99)
Potassium: 3.7 mEq/L (ref 3.5–5.1)
Sodium: 141 mEq/L (ref 135–145)

## 2012-11-13 LAB — PRO B NATRIURETIC PEPTIDE: Pro B Natriuretic peptide (BNP): 4098 pg/mL — ABNORMAL HIGH (ref 0–125)

## 2012-11-13 LAB — GLUCOSE, CAPILLARY
Glucose-Capillary: 92 mg/dL (ref 70–99)
Glucose-Capillary: 177 mg/dL — ABNORMAL HIGH (ref 70–99)
Glucose-Capillary: 120 mg/dL — ABNORMAL HIGH (ref 70–99)
Glucose-Capillary: 91 mg/dL (ref 70–99)

## 2012-11-13 MED ORDER — LEVOTHYROXINE SODIUM 50 MCG PO TABS
50.0000 ug | ORAL_TABLET | Freq: Every day | ORAL | Status: DC
  Administered 2012-11-14 – 2012-11-15 (×2): 50 ug via ORAL
  Filled 2012-11-13 (×3): qty 1

## 2012-11-13 MED ORDER — FUROSEMIDE 10 MG/ML IJ SOLN
120.0000 mg | Freq: Three times a day (TID) | INTRAVENOUS | Status: DC
  Administered 2012-11-13 – 2012-11-14 (×3): 120 mg via INTRAVENOUS
  Filled 2012-11-13 (×4): qty 12

## 2012-11-13 MED ORDER — POLYETHYLENE GLYCOL 3350 17 G PO PACK
17.0000 g | PACK | Freq: Every day | ORAL | Status: DC
  Administered 2012-11-13 – 2012-11-15 (×3): 17 g via ORAL
  Filled 2012-11-13 (×3): qty 1

## 2012-11-13 MED ORDER — SENNOSIDES-DOCUSATE SODIUM 8.6-50 MG PO TABS
2.0000 | ORAL_TABLET | Freq: Two times a day (BID) | ORAL | Status: DC
  Administered 2012-11-13 – 2012-11-15 (×5): 2 via ORAL
  Filled 2012-11-13 (×6): qty 2

## 2012-11-13 NOTE — Telephone Encounter (Signed)
PER  DR NISHAN  NEEDS   NEXT AVAILABLE  APPT WITH  DR Eden Emms .Zack Seal

## 2012-11-13 NOTE — Clinical Documentation Improvement (Signed)
THIS DOCUMENT IS NOT A PERMANENT PART OF THE MEDICAL RECORD  Please update your documentation with the medical record to reflect your response to this query. If you need help knowing how to do this please call [phone].  11/13/12  Dr. David Stall,  In an effort to better capture your patient's severity of illness, reflect appropriate length of stay and utilization of resources, a review of the patient medical record has revealed the following information:    - Admitted with Acute on Chronic Diastolic Heart Failure   - Known history of HTN and Renal Transplant 2008   - CXR reveals enlarged cardiac silhouette   - Echo reveals Grade 1 Diastolic Dysfunction, Severe LVH, Severe Concentric Hypertrophy      Based on your clinical judgment, please document in the progress notes and discharge summary if a condition below provides greater specificity regarding the patient's Heart Failure, Hypertension and diagnostic studies:    - Hypertensive Heart and Kidney Disease   - Other Condition   - Unable to Clinically Determine   The fact queries are asked, does not imply that any particular answer is desired or expected.   Thank You,  Jerral Ralph  RN BSN CCDS Clinical Documentation Specialist: [phone] Health Information Management [place]

## 2012-11-13 NOTE — Progress Notes (Signed)
Report given to receiving RN. Patient is stable with no verbal complaints and no signs or symptoms of distress or discomfort.  

## 2012-11-13 NOTE — Telephone Encounter (Signed)
Patient called back and I gave her the pathology results.

## 2012-11-13 NOTE — Progress Notes (Addendum)
Inpatient Diabetes Program Recommendations  AACE/ADA: New Consensus Statement on Inpatient Glycemic Control (2013)  Target Ranges:  Prepandial:   less than 140 mg/dL      Peak postprandial:   less than 180 mg/dL (1-2 hours)      Critically ill patients:  140 - 180 mg/dL   Hypoglycemia this am. Please decrease lantus back to 30 units bid.  Pt had 69 mg/dL this am. See note below from 11/12/2012  Inpatient Diabetes Program Recommendations Insulin - Basal: Would not recommend increase in lantus again.  Fasting glucose this am at 165 mg/dL. Lantus increased by a total of 20 units per day. May risk fasting hypoglycemia. Pt akes total of 55 units at home Insulin - Meal Coverage: May need some meal coverage instead as pt takes 13-13-16 units for BF, L, and S at home.  Thank you, Lenor Coffin, RN, CNS, Diabetes Coordinator [phone]) Ad Text paged Dr Robb Matar with request to decrease lantus back to 30 units bid.

## 2012-11-13 NOTE — Telephone Encounter (Signed)
Message copied by Alois Cliche on Fri Nov 13, 2012  8:26 AM ------      Message from: Wendall Stade      Created: Mon Nov 09, 2012  6:46 PM       She has a moderate pericardial effusion and abnormal myovue suggesting ischemia      Needs cath History of renal transplant but Cr is normal Who is her kidney doctor      Needs right and left heart cath            ----- Message -----         From: Farris Has Deal         Sent: 11/05/2012  10:00 AM           To: Wendall Stade, MD                   ------

## 2012-11-13 NOTE — Plan of Care (Signed)
Problem: Phase I Progression Outcomes Goal: EF % per last Echo/documented,Core Reminder form on chart Outcome: Completed/Met Date Met:  11/13/12 EF=60-65%

## 2012-11-13 NOTE — Progress Notes (Signed)
[facility] PROGRESS NOTE Interim History: 62 y.o. female with Past medical history of diastolic heart failure, diabetes mellitus, coronary artery disease, kidney transplant, TIA. She presented today with a complaint of shortness of breath that has been ongoing since last 3 days. Patient has recently undergone right lumpectomy 2 days ago. Before that she had a preoperative medical clearance by cardiology at which time she was found to have pericardial effusion for which no treatment change were done as the effusion was not significant. She mentions that she has been compliant with her Lasix dose of 160 mg twice a day and has been urinating her normal. Due to ongoing surgery she has been having poor oral intake. Despite that she has gained 8 pounds over last 2 days and feels that her abdomen is distended as well as her legs are swollen. She had shortness of breath which also progressed over last 3 days primarily associated with orthopnea  Filed Weights   11/11/12 0239 11/12/12 0609 11/13/12 0504  Weight: 89.812 kg (198 lb) 88.633 kg (195 lb 6.4 oz) 87.998 kg (194 lb)        Intake/Output Summary (Last 24 hours) at 11/13/12 1010 Last data filed at 11/13/12 0823  Gross per 24 hour  Intake    720 ml  Output    650 ml  Net     70 ml     Assessment/Plan: Acute respiratory failure/Acute on chronic diastolic heart failure - Echo [id number] showed EF 60% with grade 1 diastolic heart failure. - Estimated dry weight 180 kg. - Increase lasix  IV add zaroxolyn. Check urine Na and creatinine. - Strict I and O's. Fluid restrict. Poor urine output.. - baseline Cr 1.3  Hypothyroidism: - Repeat TSH and free T4, high. - start synthroid     Breast cancer of lower-outer quadrant of right female breast - Follow up with oncology.  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 - BG high due to solumedrol, now d/c.  HYPERTENSION Heart  disease: - cont norvasc, lasix, Imdur metoprolol  KIDNEY TRANSPLANTATION - cont meds.    Code Status: Full  Family Communication: Family was present at bedside, opportunity was given to the family to ask question and all questions were answered satisfactorily at the time of interview.  Disposition: Admitted to inpatient in telemetry.    Consultants:  none  Procedures: ECHO: ejection fraction was in the range of 60% to 65%. Regional wall motion abnormalities cannot be excluded. Doppler parameters are consistent with abnormal left ventricular relaxation (grade 1 diastolic dysfunction).  Antibiotics:  none   HPI/Subjective: No complains.  Objective: Filed Vitals:   11/12/12 1035 11/12/12 1355 11/12/12 2038 11/13/12 0504  BP: 118/58 111/54 113/47 120/53  Pulse: 67 66 66 58  Temp:  98 F (36.7 C) 98.1 F (36.7 C) 98.1 F (36.7 C)  TempSrc:  Oral Oral Oral  Resp:  18 18 18   Height:      Weight:    87.998 kg (194 lb)  SpO2:  96% 95% 93%     Exam:  General: Alert, awake, oriented x3, in no acute distress.  HEENT: No bruits, no goiter. +JVD. Heart: Regular rate and rhythm, without murmurs, rubs, gallops.  Lungs: Good air movement, bilateral air movement.  Abdomen: Soft, nontender, nondistended, positive bowel sounds.    Data Reviewed: Basic Metabolic Panel:  Recent Labs Lab 11/09/12 0938 11/10/12 2135 11/11/12 0610 11/12/12 0424 11/13/12 0440  NA 140 139 138  141 141  K 3.9 4.3 4.4 3.9 3.7  CL 98 99 98 100 101  CO2 30 31 29 31 30   GLUCOSE 221* 199* 413* 243* 115*  BUN 20 24* 24* 25* 22  CREATININE 1.14* 1.44* 1.25* 1.22* 1.22*  CALCIUM 8.9 8.7 8.5 8.7 8.4   Liver Function Tests:  Recent Labs Lab 11/11/12 0610  AST 31  ALT 28  ALKPHOS 51  BILITOT 0.2*  PROT 7.2  ALBUMIN 3.8   No results found for this basename: LIPASE, AMYLASE,  in the last 168 hours No results found for this basename: AMMONIA,  in the last 168 hours CBC:  Recent Labs Lab  11/09/12 0938 11/10/12 2135 11/11/12 0610  WBC 6.0 6.2 6.1  HGB 12.4 11.7* 12.0  HCT 39.6 36.0 38.3  MCV 94.1 92.5 94.8  PLT 189 203 192   Cardiac Enzymes:  Recent Labs Lab 11/11/12 0610  TROPONINI <0.30   BNP (last 3 results)  Recent Labs  11/10/12 2135 11/11/12 0610 11/13/12 0440  PROBNP 2877.0* 2197.0* 4098.0*   CBG:  Recent Labs Lab 11/12/12 1058 11/12/12 1619 11/12/12 2113 11/13/12 0613 11/13/12 0725  GLUCAP 105* 214* 187* 69* 92    No results found for this or any previous visit (from the past 240 hour(s)).   Studies: No results found.  Scheduled Meds: . amLODipine  10 mg Oral Daily  . aspirin EC  325 mg Oral Daily  . azaTHIOprine  50 mg Oral BID  . brimonidine  1 drop Right Eye BID  . cinacalcet  30 mg Oral Daily  . docusate sodium  100 mg Oral BID  . furosemide  120 mg Intravenous Q8H  . heparin  5,000 Units Subcutaneous Q8H  . insulin aspart  0-15 Units Subcutaneous TID WC  . insulin aspart  0-5 Units Subcutaneous QHS  . insulin aspart  13 Units Subcutaneous TID WC  . insulin glargine  40 Units Subcutaneous BID  . isosorbide mononitrate  60 mg Oral Daily  . metoprolol  100 mg Oral BID  . minoxidil  2.5 mg Oral BID  . pantoprazole  40 mg Oral Daily  . predniSONE  10 mg Oral Q breakfast  . sodium chloride  3 mL Intravenous Q12H  . tacrolimus  9 mg Oral BID  . timolol  1 drop Right Eye BID   Continuous Infusions:    Marinda Elk  [facility] Pager [phone]. If 8PM-8AM, please contact night-coverage at [email or web address], password [facility] 11/13/2012, 10:10 AM  LOS: 3 days

## 2012-11-13 NOTE — Progress Notes (Signed)
Pt a/o, pt c/o pain at 139 am PRN Norco given, pt resting in bed, will continue to monitor

## 2012-11-14 LAB — BASIC METABOLIC PANEL
BUN: 29 mg/dL — ABNORMAL HIGH (ref 6–23)
CO2: 31 mEq/L (ref 19–32)
CO2: 28 mEq/L (ref 19–32)
Calcium: 8.5 mg/dL (ref 8.4–10.5)
Chloride: 99 mEq/L (ref 96–112)
Creatinine, Ser: 1.37 mg/dL — ABNORMAL HIGH (ref 0.50–1.10)
Creatinine, Ser: 1.68 mg/dL — ABNORMAL HIGH (ref 0.50–1.10)
GFR calc Af Amer: 37 mL/min — ABNORMAL LOW (ref >90)
GFR calc non Af Amer: 41 mL/min — ABNORMAL LOW (ref >90)
GFR calc non Af Amer: 32 mL/min — ABNORMAL LOW (ref >90)
Glucose, Bld: 108 mg/dL — ABNORMAL HIGH (ref 70–99)
Potassium: 3.6 mEq/L (ref 3.5–5.1)
Sodium: 143 mEq/L (ref 135–145)

## 2012-11-14 LAB — GLUCOSE, CAPILLARY: Glucose-Capillary: 150 mg/dL — ABNORMAL HIGH (ref 70–99)

## 2012-11-14 MED ORDER — METOLAZONE 10 MG PO TABS
10.0000 mg | ORAL_TABLET | Freq: Every day | ORAL | Status: DC
  Filled 2012-11-14: qty 1

## 2012-11-14 MED ORDER — METOLAZONE 10 MG PO TABS
10.0000 mg | ORAL_TABLET | Freq: Two times a day (BID) | ORAL | Status: DC
  Administered 2012-11-14: 10 mg via ORAL
  Filled 2012-11-14 (×3): qty 1

## 2012-11-14 MED ORDER — METOLAZONE 5 MG PO TABS: 5.0000 mg | ORAL_TABLET | Freq: Two times a day (BID) | ORAL | Status: DC

## 2012-11-14 MED ORDER — METOLAZONE 10 MG PO TABS
10.0000 mg | ORAL_TABLET | Freq: Two times a day (BID) | ORAL | Status: DC
  Filled 2012-11-14: qty 1

## 2012-11-14 MED ORDER — FUROSEMIDE 80 MG PO TABS
80.0000 mg | ORAL_TABLET | Freq: Two times a day (BID) | ORAL | Status: DC
  Filled 2012-11-14 (×3): qty 1

## 2012-11-14 MED ORDER — INSULIN ASPART 100 UNIT/ML ~~LOC~~ SOLN
5.0000 [IU] | Freq: Three times a day (TID) | SUBCUTANEOUS | Status: DC
  Administered 2012-11-15: 5 [IU] via SUBCUTANEOUS

## 2012-11-14 MED ORDER — FUROSEMIDE 10 MG/ML IJ SOLN
10.0000 mg/h | INTRAVENOUS | Status: DC
  Administered 2012-11-14: 10 mg/h via INTRAVENOUS
  Filled 2012-11-14 (×2): qty 25

## 2012-11-14 MED ORDER — GLUCOSE 40 % PO GEL
ORAL | Status: AC
  Administered 2012-11-14: 0.5
  Filled 2012-11-14: qty 1

## 2012-11-14 NOTE — Progress Notes (Addendum)
Novolog insulin given after consulted with MD, blood  Sugar is 114.     1210 Blood sugar at 1130 60, client is asymptomatic, given juice, PPB and crackers with gel under tongue.  MD made aware.  Repeat blood sugar 106 at 1310.

## 2012-11-14 NOTE — Consult Note (Signed)
Cardiology Consult Note  Referring Physician: Robb Matar Primary Cardiologist:  Eden Emms  Reason for Consultation: HF  HPI:    62 y/o woman with h/o obesity, HTN, DM2,  renal failure due to DM2/HTN s/p renal transplant (2008), hyperthyroidism, breast CA and diastolic HF. We are asked to see for help with HF management.   Previously followed in [place]. Cath a few years ago with no CAD. Diagnosed with breast CA and saw Dr. Eden Emms in Oct 2014 for pre-op clearance.  1) cMRI: showed EF 55% with moderate to severe LVH and moderate pericardial effusion. No gadolinium given.   2) Myoview (10/22): showed a fixed defect in the inferolateral and anterolateral wall as well as ischemia in the mid/apical anterior wall and at the mid/apical inferior wall. EF 28%. 3) Echo (10/24):  showed severe LVH with EF 60-65%with grade 1 DD and moderate effusion.   Tests reviewed by Dr. Antoine Poche and given discrepancy of test results anf lack of cardiac symtpoms it was thought that the nuclear study was likely false + and she was cleared to proceed with surgery. Had lumpectomy 11/08/12.  Presented to ER on 10/28 with worsening SOB and 8# weight gain. +orthopnea and PND. pBNP 4098. (up from 2917) Treated with IV lasix. Initially had good response (down 5 pounds) but now u/o trailing off. Cr 1.22-> 1.37.  Breathing better. Lying flat. Edema resolved. Baseline weight from clinic 191-194 (today 195)   Review of Systems: [y] = yes, [ ]  = no   General: Weight gain [ y]; Weight loss [ ] ; Anorexia [ ] ; Fatigue [ ] ; Fever [ ] ; Chills [ ] ; Weakness [ ]   Cardiac: Chest pain/pressure [ ] ; Resting SOB [place] ]; Exertional SOB [place]ove.[place]ienne ]; Orthopnea [y]; Pedal Edema [y]; Palpitations [ ] ; Syncope [ ] ; Presyncope [ ] ; Paroxysmal nocturnal dyspnea[ y]  Pulmonary: Cough [ ] ; Wheezing[ ] ; Hemoptysis[ ] ; Sputum [ ] ; Snoring [ ]   GI: Vomiting[ ] ; Dysphagia[ ] ; Melena[ ] ; Hematochezia [ ] ; Heartburn[ ] ; Abdominal pain [ ] ; Constipation [ ] ;  Diarrhea [ ] ; BRBPR [ ]   GU: Hematuria[ ] ; Dysuria [ ] ; Nocturia[ ]   Vascular: Pain in legs with walking [ ] ; Pain in feet with lying flat [ ] ; Non-healing sores [ ] ; Stroke [ ] ; TIA [ ] ; Slurred speech [ ] ;  Neuro: Headaches[ ] ; Vertigo[ ] ; Seizures[ ] ; Paresthesias[ ] ;Blurred vision [ ] ; Diplopia [ ] ; Vision changes [ ]   Ortho/Skin: Arthritis [ ] ; Joint pain [ ] ; Muscle pain [ ] ; Joint swelling [ ] ; Back Pain [ ] ; Rash [ ]   Psych: Depression[ ] ; Anxiety[ ]   Heme: Bleeding problems [ ] ; Clotting disorders [ ] ; Anemia [ ]   Endocrine: Diabetes [ ] ; Thyroid dysfunction[ ]   Home Medications Prior to Admission medications   Medication Sig Start Date End Date Taking? Authorizing Provider  albuterol (ACCUNEB) 0.63 MG/3ML nebulizer solution Take 3 mLs (0.63 mg total) by nebulization every 6 (six) hours as needed for wheezing. 10/22/12  Yes Corwin Levins, MD  albuterol (PROVENTIL HFA;VENTOLIN HFA) 108 (90 BASE) MCG/ACT inhaler Inhale 2 puffs into the lungs every 6 (six) hours as needed for wheezing.   Yes Historical Provider, MD  amLODipine (NORVASC) 10 MG tablet Take 1 tablet (10 mg total) by mouth daily. 09/28/12  Yes Corwin Levins, MD  aspirin EC 325 MG tablet Take 325 mg by mouth daily.     Yes Historical Provider, MD  azaTHIOprine (IMURAN) 50 MG tablet Take 50 mg by mouth 2 (  two) times daily.     Yes Historical Provider, MD  brimonidine (ALPHAGAN) 0.2 % ophthalmic solution Place 1 drop into the right eye 2 (two) times daily.   Yes Historical Provider, MD  cinacalcet (SENSIPAR) 30 MG tablet Take 1 tablet (30 mg total) by mouth daily. 04/09/12  Yes Corwin Levins, MD  docusate sodium (COLACE) 100 MG capsule Take 1 capsule (100 mg total) by mouth 2 (two) times daily. 01/23/12  Yes Romie Levee, MD  esomeprazole (NEXIUM) 40 MG capsule Take 40 mg by mouth daily before breakfast.   Yes Historical Provider, MD  furosemide (LASIX) 80 MG tablet Take 160 mg by mouth 2 (two) times daily.    Yes Historical Provider,  MD  HYDROcodone-acetaminophen (NORCO) 5-325 MG per tablet Take 1 tablet by mouth every 4 (four) hours as needed for pain. 11/09/12  Yes Currie Paris, MD  insulin aspart (NOVOLOG) 100 UNIT/ML injection Inject 13-16 Units into the skin 3 (three) times daily. Injects 13 units daily at breakfast, 13 units daily at lunch, and 16 units daily at supper.   Yes Historical Provider, MD  insulin glargine (LANTUS) 100 UNIT/ML injection Inject 55 Units into the skin at bedtime. 04/28/12 04/28/13 Yes Carlus Pavlov, MD  isosorbide mononitrate (IMDUR) 60 MG 24 hr tablet Take 60 mg by mouth daily.   Yes Historical Provider, MD  Magnesium Oxide 250 MG TABS Take 250 mg by mouth daily.    Yes Historical Provider, MD  metoprolol (LOPRESSOR) 100 MG tablet Take 100 mg by mouth 2 (two) times daily. 08/19/12  Yes Gaylord Shih, MD  minoxidil (LONITEN) 2.5 MG tablet Take 2.5 mg by mouth 2 (two) times daily.   Yes Historical Provider, MD  Multiple Vitamin (MULTIVITAMIN) capsule Take 1 capsule by mouth daily.     Yes Historical Provider, MD  nitroGLYCERIN (NITROSTAT) 0.4 MG SL tablet Place 1 tablet (0.4 mg total) under the tongue every 5 (five) minutes as needed for chest pain. 09/30/11 03/22/13 Yes Gaylord Shih, MD  polyethylene glycol (MIRALAX / GLYCOLAX) packet Take 17 g by mouth daily.   Yes Historical Provider, MD  potassium chloride (KLOR-CON 10) 10 MEQ tablet Take 1 tablet (10 mEq total) by mouth 2 (two) times daily. 04/09/12  Yes Corwin Levins, MD  pravastatin (PRAVACHOL) 20 MG tablet Take 10 mg by mouth at bedtime.     Yes Historical Provider, MD  prednisoLONE acetate (PRED FORTE) 1 % ophthalmic suspension Place 1 drop into the left eye 3 (three) times daily.   Yes Historical Provider, MD  predniSONE (DELTASONE) 10 MG tablet Take 10 mg by mouth daily.   Yes Historical Provider, MD  tacrolimus (PROGRAF) 1 MG capsule Take 9 mg by mouth 2 (two) times daily.    Yes Historical Provider, MD  timolol (TIMOPTIC) 0.25 %  ophthalmic solution Place 1 drop into the right eye 2 (two) times daily.   Yes Historical Provider, MD  traMADol (ULTRAM) 50 MG tablet Take 50 mg by mouth every 6 (six) hours as needed for pain.   Yes Historical Provider, MD    Past Medical History: Past Medical History  Diagnosis Date  . CHF (congestive heart failure)   . Diabetes mellitus   . Acid reflux   . S/P kidney transplant   . CAD (coronary artery disease)     native vessel  . GERD (gastroesophageal reflux disease)   . Diastolic dysfunction   . Hyperlipidemia   . DJD (degenerative joint disease)   .  TIA (transient ischemic attack)   . Hx of colonic polyps   . PUD (peptic ulcer disease)   . Diabetic retinopathy   . Heart murmur   . Hemorrhoids   . Generalized headaches   . Hypertension   . Asthma   . MI (myocardial infarction) 2011  . Kidney [place]   . Nephrolithiasis   . Mental disorder   . Stroke 2008    no deficits  . Anemia     Hx-not current  . Breast cancer   . Blind left eye   . Pneumonia     Past Surgical History: Past Surgical History  Procedure Laterality Date  . Transplantation renal  05/2006  . Cardiac catheterization  2011  . Abdominal hysterectomy    . Cataract extraction  bil  . Intestinal perforation surgery  01/2009  . Breast biopsy  2010  . Gallstones removed    . Cholecystectomy  2008  . Colon surgery  2013    polyps removed  . Hemorrhoid surgery  01/23/2012    Procedure: HEMORRHOIDECTOMY PROLAPSED;  Surgeon: Romie Levee, MD;  Location: [facility];  Service: General;  Laterality: N/A;  . Eye surgery Left 1997  . Foot surgery Right 2013  . Breast lumpectomy with sentinel lymph node biopsy Right 11/09/2012    Procedure: RIGHT BREAST LUMPECTOMY WITH SENTINEL LYMPH NODE REMOVAL;  Surgeon: Currie Paris, MD;  Location: [facility] OR;  Service: General;  Laterality: Right;    Family History: Family History  Problem Relation Age of Onset  . Kidney disease Neg Hx   .  Arthritis Other     parent  . Heart disease Other     parent  . Hypertension Other     parent  . Diabetes Other     parent  . Diabetes Other     grandparent  . Thyroid disease Other     several  . Heart disease Father     Social History: History   Social History  . Marital Status: Legally Separated    Spouse Name: N/A    Number of Children: 4  . Years of Education: N/A   Occupational History  . Disables/SSI since 2002    Social History Main Topics  . Smoking status: Never Smoker   . Smokeless tobacco: Never Used  . Alcohol Use: No  . Drug Use: No  . Sexual Activity: None   Other Topics Concern  . None   Social History Narrative  . None    Allergies:  Allergies  Allergen Reactions  . Cephalexin Swelling    Swelling to face, chills  . Propoxyphene-Acetaminophen Hives and Itching    Objective:    Vital Signs:   Temp:  [97.2 F (36.2 C)-98.4 F (36.9 C)] 97.2 F (36.2 C) (11/01 1400) Pulse Rate:  [63-78] 63 (11/01 1400) Resp:  [18-20] 20 (11/01 1400) BP: (113-133)/(50-78) 113/56 mmHg (11/01 1400) SpO2:  [93 %-100 %] 100 % (11/01 1400) Weight:  [88.7 kg (195 lb 8.8 oz)] 88.7 kg (195 lb 8.8 oz) (11/01 0459) Last BM Date: 12/12/12  Weight change: Filed Weights   11/12/12 0609 11/13/12 0504 11/14/12 0459  Weight: 88.633 kg (195 lb 6.4 oz) 87.998 kg (194 lb) 88.7 kg (195 lb 8.8 oz)    Intake/Output:   Intake/Output Summary (Last 24 hours) at 11/14/12 1517 Last data filed at 11/14/12 1300  Gross per 24 hour  Intake    604 ml  Output   1475 ml  Net   -  871 ml     Physical Exam: General:  Lying flat. Well appearing. No resp difficulty HEENT: normal Neck: supple. JVP flat . Carotids 2+ bilat; no bruits. No lymphadenopathy or thryomegaly appreciated. Cor: PMI nondisplaced. Regular rate & rhythm. No rubs, gallops or murmurs. Lungs: clear Abdomen: obese soft, nontender, nondistended. No hepatosplenomegaly. No bruits or masses. Good bowel  sounds. Extremities: no cyanosis, clubbing, rash, edema Neuro: alert & orientedx3, cranial nerves grossly intact. moves all 4 extremities w/o difficulty. Affect pleasant  Telemetry:  Sinus rhythm   Labs: Basic Metabolic Panel:  Recent Labs Lab 11/10/12 2135 11/11/12 0610 11/12/12 0424 11/13/12 0440 11/14/12 0350  NA 139 138 141 141 143  K 4.3 4.4 3.9 3.7 3.6  CL 99 98 100 101 102  CO2 31 29 31 30 31   GLUCOSE 199* 413* 243* 115* 156*  BUN 24* 24* 25* 22 27*  CREATININE 1.44* 1.25* 1.22* 1.22* 1.37*  CALCIUM 8.7 8.5 8.7 8.4 8.5    Liver Function Tests:  Recent Labs Lab 11/11/12 0610  AST 31  ALT 28  ALKPHOS 51  BILITOT 0.2*  PROT 7.2  ALBUMIN 3.8   No results found for this basename: LIPASE, AMYLASE,  in the last 168 hours No results found for this basename: AMMONIA,  in the last 168 hours  CBC:  Recent Labs Lab 11/09/12 0938 11/10/12 2135 11/11/12 0610  WBC 6.0 6.2 6.1  HGB 12.4 11.7* 12.0  HCT 39.6 36.0 38.3  MCV 94.1 92.5 94.8  PLT 189 203 192    Cardiac Enzymes:  Recent Labs Lab 11/11/12 0610  TROPONINI <0.30    BNP: BNP (last 3 results)  Recent Labs  11/10/12 2135 11/11/12 0610 11/13/12 0440  PROBNP 2877.0* 2197.0* 4098.0*    CBG:  Recent Labs Lab 11/13/12 1618 11/13/12 2157 11/14/12 0748 11/14/12 0823 11/14/12 1130  GLUCAP 120* 91 87 114* 60*    Coagulation Studies: No results found for this basename: LABPROT, INR,  in the last 72 hours  Other results: EKG: NSR with normal voltage. Chronic repol abnormalities with inferolateral TWI (no change)  Imaging:  No results found.   Medications:     Current Medications: . amLODipine  10 mg Oral Daily  . aspirin EC  325 mg Oral Daily  . azaTHIOprine  50 mg Oral BID  . brimonidine  1 drop Right Eye BID  . cinacalcet  30 mg Oral Daily  . heparin  5,000 Units Subcutaneous Q8H  . insulin aspart  0-15 Units Subcutaneous TID WC  . insulin aspart  0-5 Units Subcutaneous  QHS  . insulin aspart  5 Units Subcutaneous TID WC  . insulin glargine  40 Units Subcutaneous BID  . isosorbide mononitrate  60 mg Oral Daily  . levothyroxine  50 mcg Oral QAC breakfast  . metolazone  10 mg Oral BID  . metoprolol  100 mg Oral BID  . minoxidil  2.5 mg Oral BID  . pantoprazole  40 mg Oral Daily  . polyethylene glycol  17 g Oral Daily  . predniSONE  10 mg Oral Q breakfast  . senna-docusate  2 tablet Oral BID  . sodium chloride  3 mL Intravenous Q12H  . tacrolimus  9 mg Oral BID  . timolol  1 drop Right Eye BID     Infusions: . furosemide (LASIX) infusion 10 mg/hr (11/14/12 1250)      Assessment:   1) A/c diastolic HF 2) EF 60% LVH with moderate pericardial effusion 3) h/o ESRD  s/p renal transplant 4) Morbid obesiy 5) Breast CA s/p recent lumpectomy 6) DM2 7) HTN - controlled  Plan/Discussion:    Volume status improved with diuresis. I think she is essentially euvolemic now. Can give one dose of metolazone 2.5 if you want to push diuresis a bit further but I don't think there is much left to get.   Likely can switch back to home oral diuretic regimen and send home tomorrow.   Of note, she does have severe LVH and pericardial effusion which always raises possibility of amyloid but volts on ECG are normal and suspect LVH due to longstanding HTN.   We will sign off. Please call with questions.   Length of Stay: 4  Takashi Korol 11/14/2012, 3:17 PM  Advanced Heart Failure Team Pager [phone] (M-F; 7a - 4p)  Please contact [facility] for night-coverage after hours (4p -7a ) and weekends on [email or web address]

## 2012-11-14 NOTE — Progress Notes (Addendum)
[facility] PROGRESS NOTE Interim History: 62 y.o. female with Past medical history of diastolic heart failure, diabetes mellitus, coronary artery disease, kidney transplant, TIA. She presented today with a complaint of shortness of breath that has been ongoing since last 3 days. Patient has recently undergone right lumpectomy 2 days ago. Before that she had a preoperative medical clearance by cardiology at which time she was found to have pericardial effusion for which no treatment change were done as the effusion was not significant. She mentions that she has been compliant with her Lasix dose of 160 mg twice a day and has been urinating her normal. Due to ongoing surgery she has been having poor oral intake. Despite that she has gained 8 pounds over last 2 days and feels that her abdomen is distended as well as her legs are swollen. She had shortness of breath which also progressed over last 3 days primarily associated with orthopnea  Filed Weights   11/12/12 0609 11/13/12 0504 11/14/12 0459  Weight: 88.633 kg (195 lb 6.4 oz) 87.998 kg (194 lb) 88.7 kg (195 lb 8.8 oz)        Intake/Output Summary (Last 24 hours) at 11/14/12 0957 Last data filed at 11/14/12 0950  Gross per 24 hour  Intake    604 ml  Output   1175 ml  Net   -571 ml     Assessment/Plan: Acute respiratory failure/Acute on chronic diastolic heart failure - Echo 29.56.2130 showed EF 60% with grade 1 diastolic heart failure. - Estimated dry weight around 190 lb. - Urinary sodium 16 on lasix IV and zaroxolyn. - change to Lasix drip IV add zaroxolyn.  She is still overloaded by my physical exam. - Strict I and O's. Fluid restrict. Poor urine output.Consult heart failure team. - baseline Cr 1.3  Hypothyroidism: - Repeat TSH and free T4, high. - start synthroid     Breast cancer of lower-outer quadrant of right female breast - Follow up with oncology.  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  HYPERTENSION Heart disease: - cont norvasc, lasix, Imdur metoprolol  KIDNEY TRANSPLANTATION - cont meds.    Code Status: Full  Family Communication: Family was present at bedside, opportunity was given to the family to ask question and all questions were answered satisfactorily at the time of interview.  Disposition: Admitted to inpatient in telemetry.    Consultants:  none  Procedures: ECHO: ejection fraction was in the range of 60% to 65%. Regional wall motion abnormalities cannot be excluded. Doppler parameters are consistent with abnormal left ventricular relaxation (grade 1 diastolic dysfunction).  Antibiotics:  none   HPI/Subjective: No complains.  Objective: Filed Vitals:   11/13/12 1737 11/13/12 2103 11/14/12 0459 11/14/12 0948  BP: 133/60 123/59 130/50 122/78  Pulse: 66 69 63 78  Temp:  98.1 F (36.7 C) 98.4 F (36.9 C) 98 F (36.7 C)  TempSrc:  Oral Oral Oral  Resp:  18 18   Height:      Weight:   88.7 kg (195 lb 8.8 oz)   SpO2:  93% 100% 100%     Exam:  General: Alert, awake, oriented x3, in no acute distress.  HEENT: No bruits, no goiter. +JVD. Heart: Regular rate and rhythm, without murmurs, rubs, gallops.  Lungs: Good air movement, bilateral air movement.  Abdomen: Soft, nontender, nondistended, positive bowel sounds.    Data Reviewed: Basic Metabolic Panel:  Recent Labs Lab 11/10/12 2135 11/11/12  0454 11/12/12 0424 11/13/12 0440 11/14/12 0350  NA 139 138 141 141 143  K 4.3 4.4 3.9 3.7 3.6  CL 99 98 100 101 102  CO2 31 29 31 30 31   GLUCOSE 199* 413* 243* 115* 156*  BUN 24* 24* 25* 22 27*  CREATININE 1.44* 1.25* 1.22* 1.22* 1.37*  CALCIUM 8.7 8.5 8.7 8.4 8.5   Liver Function Tests:  Recent Labs Lab 11/11/12 0610  AST 31  ALT 28  ALKPHOS 51  BILITOT 0.2*  PROT 7.2  ALBUMIN 3.8   No results found for this basename: LIPASE, AMYLASE,  in the last 168 hours No  results found for this basename: AMMONIA,  in the last 168 hours CBC:  Recent Labs Lab 11/09/12 0938 11/10/12 2135 11/11/12 0610  WBC 6.0 6.2 6.1  HGB 12.4 11.7* 12.0  HCT 39.6 36.0 38.3  MCV 94.1 92.5 94.8  PLT 189 203 192   Cardiac Enzymes:  Recent Labs Lab 11/11/12 0610  TROPONINI <0.30   BNP (last 3 results)  Recent Labs  11/10/12 2135 11/11/12 0610 11/13/12 0440  PROBNP 2877.0* 2197.0* 4098.0*   CBG:  Recent Labs Lab 11/13/12 1120 11/13/12 1618 11/13/12 2157 11/14/12 0748 11/14/12 0823  GLUCAP 177* 120* 91 87 114*    No results found for this or any previous visit (from the past 240 hour(s)).   Studies: No results found.  Scheduled Meds: . amLODipine  10 mg Oral Daily  . aspirin EC  325 mg Oral Daily  . azaTHIOprine  50 mg Oral BID  . brimonidine  1 drop Right Eye BID  . cinacalcet  30 mg Oral Daily  . heparin  5,000 Units Subcutaneous Q8H  . insulin aspart  0-15 Units Subcutaneous TID WC  . insulin aspart  0-5 Units Subcutaneous QHS  . insulin aspart  13 Units Subcutaneous TID WC  . insulin glargine  40 Units Subcutaneous BID  . isosorbide mononitrate  60 mg Oral Daily  . levothyroxine  50 mcg Oral QAC breakfast  . metolazone  10 mg Oral BID  . metoprolol  100 mg Oral BID  . minoxidil  2.5 mg Oral BID  . pantoprazole  40 mg Oral Daily  . polyethylene glycol  17 g Oral Daily  . predniSONE  10 mg Oral Q breakfast  . senna-docusate  2 tablet Oral BID  . sodium chloride  3 mL Intravenous Q12H  . tacrolimus  9 mg Oral BID  . timolol  1 drop Right Eye BID   Continuous Infusions: . furosemide (LASIX) infusion       FELIZ Rosine Beat  [facility] Pager [phone]. If 8PM-8AM, please contact night-coverage at [email or web address], password [facility] 11/14/2012, 9:57 AM  LOS: 4 days

## 2012-11-15 DIAGNOSIS — E1159 Type 2 diabetes mellitus with other circulatory complications: Secondary | ICD-10-CM

## 2012-11-15 DIAGNOSIS — E0590 Thyrotoxicosis, unspecified without thyrotoxic crisis or storm: Secondary | ICD-10-CM

## 2012-11-15 LAB — GLUCOSE, CAPILLARY: Glucose-Capillary: 64 mg/dL — ABNORMAL LOW (ref 70–99)

## 2012-11-15 NOTE — Progress Notes (Signed)
Verbalized understanding of discharge instructions.  Blood sugar 67, given juice, sprite and applesauce.  Recheck of blood sugar 15 minutes later 97.  MD made aware.  Discharged as ordered.

## 2012-11-15 NOTE — Progress Notes (Signed)
Client is alert and oriented.  No SOB noted.  Pulse regular.  Skin is warm and dry to touch.  Awaiting discharge.

## 2012-11-15 NOTE — Progress Notes (Signed)
Verbalized understanding of discharge instructions.  Remains in good spirits.  HF education reinforced and client given another booklet.

## 2012-11-15 NOTE — Discharge Summary (Addendum)
Physician Discharge Summary  Monica Lee ZOX:[id number] DOB: October 14, 1950 DOA: 11/10/2012  PCP: Oliver Barre, MD  Admit date: 11/10/2012 Discharge date: 11/16/2012  Time spent: 45 minutes  Recommendations for Outpatient Follow-up:  1. Follow up with cardiology in 1 week, follow up for HF. 2.  BNP    Component Value Date/Time   PROBNP 4299.0* 11/15/2012 0620   Filed Weights   11/13/12 0504 11/14/12 0459 11/15/12 0515  Weight: 87.998 kg (194 lb) 88.7 kg (195 lb 8.8 oz) 88.5 kg (195 lb 1.7 oz)     Discharge Diagnoses:  Principal Problem:   Acute on chronic diastolic heart failure Active Problems:   Acute respiratory failure   Type 2 diabetes mellitus with circulatory disorder   Hypertensive heart disease   CORONARY ARTERY DISEASE   Chronic diastolic heart failure   KIDNEY TRANSPLANTATION   Breast cancer of lower-outer quadrant of right female breast   Hypothyroidism   Discharge Condition: stable  Diet recommendation: low sodium diet    History of present illness:  62 y.o. female with Past medical history of diastolic heart failure, diabetes mellitus, coronary artery disease, kidney transplant, TIA.  She presented today with a complaint of shortness of breath that has been ongoing since last 3 days.  Patient has recently undergone right lumpectomy 2 days ago. Before that she had a preoperative medical clearance by cardiology at which time she was found to have pericardial effusion for which no treatment change were done as the effusion was not significant. She mentions that she has been compliant with her Lasix dose of 160 mg twice a day and has been urinating her normal. Due to ongoing surgery she has been having poor oral intake. Despite that she has gained 8 pounds over last 2 days and feels that her abdomen is distended as well as her legs are swollen. She had shortness of breath which also progressed over last 3 days primarily associated with orthopnea.  She denies any  headache, dizziness, pain in her extremities, diarrhea, constipation, urinary symptoms, nausea or vomiting.   Hospital Course:  Acute respiratory failure/Acute on chronic diastolic heart failure  - Echo 10.24.2014 showed EF 60% with grade 1 diastolic heart failure.  - Estimated dry weight around 88 kg. - started on IV lasix with poor diuresis added zaroxylin. - JVD resolved and able to sleep flat. - Consulted Heart failure team. Was d/c home on current home of lasix with Home health. - Strict I and O's. Fluid restrict.  - baseline Cr 1.3   Hypothyroidism:  - Repeat TSH and free T4, high.  - start synthroid  - recheck in 6 weeks.  Breast cancer of lower-outer quadrant of right female breast  - Follow up with oncology.   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   HYPERTENSION Heart disease:  - cont norvasc, lasix, Imdur metoprolol   KIDNEY TRANSPLANTATION  - cont meds.    Procedures:  Echo  Consultations:  cardiology  Discharge Exam: Filed Vitals:   11/15/12 0857  BP: 132/60  Pulse: 69  Temp:   Resp:     General: A&O x3 Cardiovascular: RRR Respiratory: good air movement CTA B/L  Discharge Instructions      Discharge Orders   Future Appointments Provider Department Dept Phone   11/23/2012 12:45 PM Victorino December, MD [facility] MEDICAL ONCOL[facility]Y [phone]   11/26/2012 1:30 PM Currie Paris, MD [facility]  [phone]   01/11/2013 2:15 PM Wendall Stade, MD [facility] [place] Office [phone]   Future Orders Complete By Expires   Call MD for:  As directed    Scheduling Instructions:     Call cardiologist for followup appointment in 1 week   Contraindication to ACEI at discharge  As directed    Contraindication to ACEI at discharge  As directed    Diet - low sodium heart healthy  As directed    Heart Failure patients record your daily  weight using the same scale at the same time of day  As directed    Increase activity slowly  As directed        Medication List         albuterol 0.63 MG/3ML nebulizer solution  Commonly known as:  ACCUNEB  Take 3 mLs (0.63 mg total) by nebulization every 6 (six) hours as needed for wheezing.     albuterol 108 (90 BASE) MCG/ACT inhaler  Commonly known as:  PROVENTIL HFA;VENTOLIN HFA  Inhale 2 puffs into the lungs every 6 (six) hours as needed for wheezing.     amLODipine 10 MG tablet  Commonly known as:  NORVASC  Take 1 tablet (10 mg total) by mouth daily.     aspirin EC 325 MG tablet  Take 325 mg by mouth daily.     azaTHIOprine 50 MG tablet  Commonly known as:  IMURAN  Take 50 mg by mouth 2 (two) times daily.     brimonidine 0.2 % ophthalmic solution  Commonly known as:  ALPHAGAN  Place 1 drop into the right eye 2 (two) times daily.     cinacalcet 30 MG tablet  Commonly known as:  SENSIPAR  Take 1 tablet (30 mg total) by mouth daily.     docusate sodium 100 MG capsule  Commonly known as:  COLACE  Take 1 capsule (100 mg total) by mouth 2 (two) times daily.     esomeprazole 40 MG capsule  Commonly known as:  NEXIUM  Take 40 mg by mouth daily before breakfast.     furosemide 80 MG tablet  Commonly known as:  LASIX  Take 160 mg by mouth 2 (two) times daily.     HYDROcodone-acetaminophen 5-325 MG per tablet  Commonly known as:  NORCO  Take 1 tablet by mouth every 4 (four) hours as needed for pain.     insulin aspart 100 UNIT/ML injection  Commonly known as:  novoLOG  Inject 13-16 Units into the skin 3 (three) times daily. Injects 13 units daily at breakfast, 13 units daily at lunch, and 16 units daily at supper.     insulin glargine 100 UNIT/ML injection  Commonly known as:  LANTUS  Inject 55 Units into the skin at bedtime.     isosorbide mononitrate 60 MG 24 hr tablet  Commonly known as:  IMDUR  Take 60 mg by mouth daily.     Magnesium Oxide 250 MG Tabs   Take 250 mg by mouth daily.     metoprolol 100 MG tablet  Commonly known as:  LOPRESSOR  Take 100 mg by mouth 2 (two) times daily.     minoxidil 2.5 MG tablet  Commonly known as:  LONITEN  Take 2.5 mg by mouth 2 (two) times daily.     multivitamin capsule  Take 1 capsule by mouth daily.     nitroGLYCERIN 0.4 MG SL tablet  Commonly known as:  NITROSTAT  Place 1 tablet (0.4  mg total) under the tongue every 5 (five) minutes as needed for chest pain.     polyethylene glycol packet  Commonly known as:  MIRALAX / GLYCOLAX  Take 17 g by mouth daily.     potassium chloride 10 MEQ tablet  Commonly known as:  KLOR-CON 10  Take 1 tablet (10 mEq total) by mouth 2 (two) times daily.     pravastatin 20 MG tablet  Commonly known as:  PRAVACHOL  Take 10 mg by mouth at bedtime.     prednisoLONE acetate 1 % ophthalmic suspension  Commonly known as:  PRED FORTE  Place 1 drop into the left eye 3 (three) times daily.     predniSONE 10 MG tablet  Commonly known as:  DELTASONE  Take 10 mg by mouth daily.     tacrolimus 1 MG capsule  Commonly known as:  PROGRAF  Take 9 mg by mouth 2 (two) times daily.     timolol 0.25 % ophthalmic solution  Commonly known as:  TIMOPTIC  Place 1 drop into the right eye 2 (two) times daily.     traMADol 50 MG tablet  Commonly known as:  ULTRAM  Take 50 mg by mouth every 6 (six) hours as needed for pain.       Allergies  Allergen Reactions  . Cephalexin Swelling    Swelling to face, chills  . Propoxyphene-Acetaminophen Hives and Itching   Follow-up Information   Follow up with DETERDING,JAMES L, MD In 1 week. (hospital follow up b-met)    Specialty:  Nephrology   Contact information:   [address] [facility] [place] [address] [phone]       Follow up with Charlton Haws, MD In 1 week. (hospital follow up for HF.)    Specialty:  Cardiology   Contact information:   [address] [place]  [address] [phone]        The results of significant diagnostics from this hospitalization (including imaging, microbiology, ancillary and laboratory) are listed below for reference.    Significant Diagnostic Studies: Dg Chest 2 View  11/10/2012   CLINICAL DATA:  Shortness of Breath  EXAM: CHEST  2 VIEW  COMPARISON:  May 04, 2012  FINDINGS: There is no edema or consolidation. There is diffuse enlargement of the cardiac silhouette. Pulmonary vascularity is normal. No adenopathy. No bone lesions.  IMPRESSION: Enlarged cardiac silhouette. Question pericardial effusion. No edema or consolidation.   Electronically Signed   By: Bretta Bang M.D.   On: 11/10/2012 21:46   Nm Sentinel Node Inj-no Rpt (breast)  11/09/2012   CLINICAL DATA: right breast cancer   Sulfur colloid was injected intradermally by the nuclear medicine  technologist for breast cancer sentinel node localization.    Mr Card Morphology W/o Cm  10/29/2012   CLINICAL DATA:  Cardiomyopathy:  Assess EF  EXAM: CARDIAC MRI  TECHNIQUE: The patient was scanned on a 1.5 Tesla GE magnet. A dedicated cardiac coil was used. Functional imaging was done using Fiesta sequences. 2,3, and 4 chamber views were done to assess for RWMA's. Modified Simpson's rule using a short axis stack was used to calculate an ejection fraction on a dedicated work [employer]. No multihance was used due to the patient previous renal transplant.  CONTRAST:  None  FINDINGS: There was moderate to severel LVH with septal thickness 15mm. There was a moderate circumferential pericardial effusion. There was mild LAE Right sided cardiac chambers were normal  with no diastolic flattening. The aortic valve had some turbulence to flow in systole There was no significant MR or AR. The LV cavity size was small with prominent papillary muscles and a narrow LVOT. There was chordal SAM with no evidence of sub valvular obstruction.  There were no discrete RWMA;s  Quantitative EF was 55%(EDV 130 cc ESV 59 cc and SV 71cc) Global longitudinal strain was normal at 1 21.5%  IMPRESSION: 1) Moderate to severe LVH with normal EF 55% Small LV cavity size with chordal SAM but o LVOT gradient or true HOCM  2) Normal longitudinal strain -21.5%  3) Moderate circumferential pericardial effusion  4) Normal RV/RA  5) Mild LAE  No gadolinium given so no infarct imaging done due to patients history of renal transplant  Charlton Haws   Electronically Signed   By: Charlton Haws M.D.   On: 10/29/2012 13:50    Microbiology: No results found for this or any previous visit (from the past 240 hour(s)).   Labs: Basic Metabolic Panel:  Recent Labs Lab 11/11/12 0610 11/12/12 0424 11/13/12 0440 11/14/12 0350 11/14/12 1705  NA 138 141 141 143 139  K 4.4 3.9 3.7 3.6 4.1  CL 98 100 101 102 99  CO2 29 31 30 31 28   GLUCOSE 413* 243* 115* 156* 108*  BUN 24* 25* 22 27* 29*  CREATININE 1.25* 1.22* 1.22* 1.37* 1.68*  CALCIUM 8.5 8.7 8.4 8.5 8.4   Liver Function Tests:  Recent Labs Lab 11/11/12 0610  AST 31  ALT 28  ALKPHOS 51  BILITOT 0.2*  PROT 7.2  ALBUMIN 3.8   No results found for this basename: LIPASE, AMYLASE,  in the last 168 hours No results found for this basename: AMMONIA,  in the last 168 hours CBC:  Recent Labs Lab 11/10/12 2135 11/11/12 0610  WBC 6.2 6.1  HGB 11.7* 12.0  HCT 36.0 38.3  MCV 92.5 94.8  PLT 203 192   Cardiac Enzymes:  Recent Labs Lab 11/11/12 0610  TROPONINI <0.30   BNP: BNP (last 3 results)  Recent Labs  11/11/12 0610 11/13/12 0440 11/15/12 0620  PROBNP 2197.0* 4098.0* 4299.0*   CBG:  Recent Labs Lab 11/14/12 2213 11/15/12 0613 11/15/12 0802 11/15/12 1136 11/15/12 1208  GLUCAP 150* 64* 97 67* 95       Signed:  FELIZ ORTIZ, ABRAHAM  [facility] 11/16/2012, 1:52 PM

## 2012-11-16 LAB — GLUCOSE, CAPILLARY
Glucose-Capillary: 97 mg/dL (ref 70–99)
Glucose-Capillary: 67 mg/dL — ABNORMAL LOW (ref 70–99)

## 2012-11-17 ENCOUNTER — Other Ambulatory Visit (HOSPITAL_COMMUNITY): Payer: Medicare Other

## 2012-11-18 ENCOUNTER — Encounter (INDEPENDENT_AMBULATORY_CARE_PROVIDER_SITE_OTHER): Payer: Medicare Other | Admitting: Surgery

## 2012-11-19 ENCOUNTER — Ambulatory Visit (INDEPENDENT_AMBULATORY_CARE_PROVIDER_SITE_OTHER): Payer: Medicare Other | Admitting: Physician Assistant

## 2012-11-19 ENCOUNTER — Encounter: Payer: Self-pay | Admitting: Physician Assistant

## 2012-11-19 VITALS — BP 144/78 | HR 72 | Ht 65.0 in | Wt 169.0 lb

## 2012-11-19 DIAGNOSIS — I10 Essential (primary) hypertension: Secondary | ICD-10-CM

## 2012-11-19 DIAGNOSIS — I319 Disease of pericardium, unspecified: Secondary | ICD-10-CM

## 2012-11-19 DIAGNOSIS — N189 Chronic kidney disease, unspecified: Secondary | ICD-10-CM | POA: Diagnosis not present

## 2012-11-19 DIAGNOSIS — Z940 Kidney transplant status: Secondary | ICD-10-CM | POA: Diagnosis not present

## 2012-11-19 DIAGNOSIS — I5032 Chronic diastolic (congestive) heart failure: Secondary | ICD-10-CM

## 2012-11-19 DIAGNOSIS — E039 Hypothyroidism, unspecified: Secondary | ICD-10-CM

## 2012-11-19 DIAGNOSIS — Z79899 Other long term (current) drug therapy: Secondary | ICD-10-CM | POA: Diagnosis not present

## 2012-11-19 DIAGNOSIS — C50519 Malignant neoplasm of lower-outer quadrant of unspecified female breast: Secondary | ICD-10-CM

## 2012-11-19 DIAGNOSIS — C50511 Malignant neoplasm of lower-outer quadrant of right female breast: Secondary | ICD-10-CM

## 2012-11-19 DIAGNOSIS — E213 Hyperparathyroidism, unspecified: Secondary | ICD-10-CM | POA: Diagnosis not present

## 2012-11-19 DIAGNOSIS — I313 Pericardial effusion (noninflammatory): Secondary | ICD-10-CM | POA: Insufficient documentation

## 2012-11-19 NOTE — Patient Instructions (Signed)
Your physician recommends that you continue on your current medications as directed. Please refer to the Current Medication list given to you today.   Your physician recommends that you schedule a follow-up appointment with Dr. Eden Emms 12/29 @ 2:15

## 2012-11-19 NOTE — Progress Notes (Signed)
[address] [place], [place]  [address] Phone: [phone] Fax:  [phone]  Date:  11/19/2012   ID:  Monica Lee, DOB 10-18-1950, MRN [id number]  PCP:  Oliver Barre, MD  Cardiologist:  Dr. Charlton Haws     History of Present Illness: Monica Lee is a 62 y.o. female the history of T2DM, HTN, diastolic CHF, hyperthyroidism, renal failure status post renal transplant, breast CA, prior TIA. She apparently had cardiac catheterization several years ago in [place] that demonstrated no CAD. She was recently seen by Dr. Eden Emms for surgical clearance for breast cancer.  Lexiscan Myoview (11/05/12): High-risk scan; fixed defect affecting the entire inferolateral and anterolateral wall; mid/apical anterior and mid/apical inferior moderate ischemia, EF 28%.  Cardiac MRI (10/2012): Moderate to severe LVH, EF 55%, chordal SAM but no LVOT gradient, mod circumferential effusion, mild LAE.  Echocardiogram (11/06/12): Severe LVH, EF 60-65%, grade 1 diastolic dysfunction, moderate effusion.  Tests were reviewed by Dr. Antoine Poche.  Given discrepancy of results and lack of symptoms, it was felt the nuclear study was false positive and she was cleared to proceed with lumpectomy on 11/08/12.    She was admitted 10/28-11/3 with a/c diastolic CHF. She presented to the ED with worsening dyspnea and an 8 pound weight gain, orthopnea and PND. She was diuresed with IV Lasix. She was seen by Dr. Gala Romney and the Heart Failure Team.  Metolazone was added to her medical regimen.  She is doing well since d/c.  Her breathing is better.  She denies orthopnea, PND, chest pain, syncope.  LE edema is much improved.    Recent Labs: 04/09/2012: HDL 39.80  11/11/2012: ALT 28; Hemoglobin 12.0  11/12/2012: TSH 40.047*  11/14/2012: Creatinine 1.68*; Potassium 4.1  11/15/2012: Pro B Natriuretic peptide (BNP) 4299.0*   Wt Readings from Last 3 Encounters:  11/19/12 169 lb (76.658 kg)  11/15/12 195 lb 1.7 oz (88.5  kg)  11/09/12 194 lb 6 oz (88.168 kg)     Past Medical History  Diagnosis Date  . Diabetes mellitus   . Acid reflux   . S/P kidney transplant   . GERD (gastroesophageal reflux disease)   . Hyperlipidemia   . DJD (degenerative joint disease)   . TIA (transient ischemic attack)   . Hx of colonic polyps   . PUD (peptic ulcer disease)   . Diabetic retinopathy   . Hemorrhoids   . Generalized headaches   . Hypertension   . Asthma   . MI (myocardial infarction) 2011  . Kidney Peddie   . Mental disorder   . Stroke 2008    no deficits  . Anemia     Hx-not current  . Breast cancer   . Blind left eye   . Pneumonia   . Chronic diastolic CHF (congestive heart failure)     a. Cardiac MRI (10/2012): Moderate to severe LVH, EF 55%, chordal SAM but no LVOT gradient, mod circumferential effusion, mild LAE.  b.  Echocardiogram (11/06/12): Severe LVH, EF 60-65%, grade 1 diastolic dysfunction, moderate effusion.  Marland Kitchen Hx of cardiovascular stress test     a. Lexiscan Myoview (11/05/12): High-risk scan; fixed defect affecting the entire inferolateral and anterolateral wall; mid/apical anterior and mid/apical inferior moderate ischemia, EF 28%.  (felt to be false + as echo and cMRI with normal EF and normal WM)  . Hx of cardiac cath     a. reportedly no CAD on cardiac cath in [place]    Current Outpatient Prescriptions  Medication Sig Dispense Refill  . albuterol (ACCUNEB) 0.63 MG/3ML nebulizer solution Take 3 mLs (0.63 mg total) by nebulization every 6 (six) hours as needed for wheezing.  75 mL  12  . albuterol (PROVENTIL HFA;VENTOLIN HFA) 108 (90 BASE) MCG/ACT inhaler Inhale 2 puffs into the lungs every 6 (six) hours as needed for wheezing.      Marland Kitchen amLODipine (NORVASC) 10 MG tablet Take 1 tablet (10 mg total) by mouth daily.  90 tablet  3  . aspirin EC 325 MG tablet Take 325 mg by mouth daily.        Marland Kitchen azaTHIOprine (IMURAN) 50 MG tablet Take 50 mg by mouth 2 (two) times daily.        . brimonidine  (ALPHAGAN) 0.2 % ophthalmic solution Place 1 drop into the right eye 2 (two) times daily.      . cinacalcet (SENSIPAR) 30 MG tablet Take 1 tablet (30 mg total) by mouth daily.  30 tablet  11  . docusate sodium (COLACE) 100 MG capsule Take 1 capsule (100 mg total) by mouth 2 (two) times daily.  60 capsule  0  . esomeprazole (NEXIUM) 40 MG capsule Take 40 mg by mouth daily before breakfast.      . furosemide (LASIX) 80 MG tablet Take 160 mg by mouth 2 (two) times daily.       Marland Kitchen HYDROcodone-acetaminophen (NORCO) 5-325 MG per tablet Take 1 tablet by mouth every 4 (four) hours as needed for pain.  30 tablet  0  . insulin aspart (NOVOLOG) 100 UNIT/ML injection Inject 13-16 Units into the skin 3 (three) times daily. Injects 13 units daily at breakfast, 13 units daily at lunch, and 16 units daily at supper.      . insulin glargine (LANTUS) 100 UNIT/ML injection Inject 55 Units into the skin at bedtime.      . isosorbide mononitrate (IMDUR) 60 MG 24 hr tablet Take 60 mg by mouth daily.      . Magnesium Oxide 250 MG TABS Take 250 mg by mouth daily.       . metoprolol (LOPRESSOR) 100 MG tablet Take 100 mg by mouth 2 (two) times daily.      . minoxidil (LONITEN) 2.5 MG tablet Take 2.5 mg by mouth 2 (two) times daily.      . Multiple Vitamin (MULTIVITAMIN) capsule Take 1 capsule by mouth daily.        . nitroGLYCERIN (NITROSTAT) 0.4 MG SL tablet Place 1 tablet (0.4 mg total) under the tongue every 5 (five) minutes as needed for chest pain.  25 tablet  11  . polyethylene glycol (MIRALAX / GLYCOLAX) packet Take 17 g by mouth daily.      . potassium chloride (KLOR-CON 10) 10 MEQ tablet Take 1 tablet (10 mEq total) by mouth 2 (two) times daily.  90 tablet  3  . pravastatin (PRAVACHOL) 20 MG tablet Take 10 mg by mouth at bedtime.        . prednisoLONE acetate (PRED FORTE) 1 % ophthalmic suspension Place 1 drop into the left eye 3 (three) times daily.      . predniSONE (DELTASONE) 10 MG tablet Take 10 mg by mouth  daily.      . tacrolimus (PROGRAF) 1 MG capsule Take 9 mg by mouth 2 (two) times daily.       . timolol (TIMOPTIC) 0.25 % ophthalmic solution Place 1 drop into the right eye 2 (two) times daily.      . traMADol (  ULTRAM) 50 MG tablet Take 50 mg by mouth every 6 (six) hours as needed for pain.       No current facility-administered medications for this visit.    Allergies:   Cephalexin and Propoxyphene-acetaminophen   Social History:  The patient  reports that she has never smoked. She has never used smokeless tobacco. She reports that she does not drink alcohol or use illicit drugs.   Family History:  The patient's family history includes Arthritis in her other; Diabetes in her other and other; Heart disease in her father and other; Hypertension in her other; Thyroid disease in her other. There is no history of Kidney disease.   ROS:  Please see the history of present illness.      All other systems reviewed and negative.   PHYSICAL EXAM: VS:  BP 144/78  Pulse 72  Ht 5\' 5"  (1.651 m)  Wt 169 lb (76.658 kg)  BMI 28.12 kg/m2  SpO2 93% Well nourished, well developed, in no acute distress HEENT: normal Neck: no JVD Cardiac:  normal S1, S2; RRR; no murmur Lungs:  clear to auscultation bilaterally, no wheezing, rhonchi or rales Abd: soft, nontender, no hepatomegaly Ext: trace bilateral LE edema Skin: warm and dry Neuro:  CNs 2-12 intact, no focal abnormalities noted  EKG:  NSR, HR 72, normal axis, LVH with repol (TWI in 1, 2, 3, aVF, V5-6)     ASSESSMENT AND PLAN:  1. Chronic Diastolic CHF:  Volume stable.  She had a renal panel drawn yesterday that was sent to Dr. Darrick Penna.  Continue current Rx.  2. Pericardial Effusion:  She will likely need a follow up echo.  This can be arranged at follow up with Dr. Charlton Haws in December.  3. Hypertension:  Controlled.  4. Hyperlipidemia:  Continue statin.  5. Diabetes Mellitus:  F/u with primary care.  6. Hypothyroidism:  F/u with primary  care.  7. Breast CA:  F/u with surgery/oncology.  8. ESRD, s/p Renal Transplant:  F/u with nephrology.  9. Disposition:  F/u with Dr. Charlton Haws in 12/2012 as planned.   Signed, Tereso Newcomer, PA-C  11/19/2012 3:33 PM

## 2012-11-23 ENCOUNTER — Telehealth: Payer: Self-pay | Admitting: *Deleted

## 2012-11-23 ENCOUNTER — Ambulatory Visit (HOSPITAL_BASED_OUTPATIENT_CLINIC_OR_DEPARTMENT_OTHER): Payer: Medicare Other | Admitting: Oncology

## 2012-11-23 ENCOUNTER — Encounter: Payer: Self-pay | Admitting: Oncology

## 2012-11-23 VITALS — BP 121/65 | HR 72 | Temp 97.7°F | Resp 18 | Ht 65.0 in | Wt 198.6 lb

## 2012-11-23 DIAGNOSIS — R5381 Other malaise: Secondary | ICD-10-CM | POA: Diagnosis not present

## 2012-11-23 DIAGNOSIS — Z171 Estrogen receptor negative status [ER-]: Secondary | ICD-10-CM | POA: Diagnosis not present

## 2012-11-23 DIAGNOSIS — C50519 Malignant neoplasm of lower-outer quadrant of unspecified female breast: Secondary | ICD-10-CM

## 2012-11-23 DIAGNOSIS — C50511 Malignant neoplasm of lower-outer quadrant of right female breast: Secondary | ICD-10-CM

## 2012-11-23 MED ORDER — LIDOCAINE-PRILOCAINE 2.5-2.5 % EX CREA: TOPICAL_CREAM | CUTANEOUS | Status: DC | PRN

## 2012-11-23 MED ORDER — LORAZEPAM 0.5 MG PO TABS: 0.5000 mg | ORAL_TABLET | Freq: Four times a day (QID) | ORAL | Status: DC | PRN

## 2012-11-23 MED ORDER — DEXAMETHASONE 4 MG PO TABS: 8.0000 mg | ORAL_TABLET | Freq: Two times a day (BID) | ORAL | Status: DC

## 2012-11-23 MED ORDER — ONDANSETRON HCL 8 MG PO TABS: 8.0000 mg | ORAL_TABLET | Freq: Two times a day (BID) | ORAL | Status: DC

## 2012-11-23 MED ORDER — PROCHLORPERAZINE MALEATE 10 MG PO TABS: 10.0000 mg | ORAL_TABLET | Freq: Four times a day (QID) | ORAL | Status: DC | PRN

## 2012-11-23 NOTE — Telephone Encounter (Signed)
appts made and printed. Pt is that tx's will be added. i emailed MW to add the tx's. i also emailed [facility] and [facility] asking for a time slot for 12/14/12. Pt is aware that once they respond i will call her w/ appt for 12/14/12...td

## 2012-11-23 NOTE — Progress Notes (Signed)
Monica Lee [id number] Nov 03, 1950 62 y.o. 11/23/2012 1:28 PM  CC  Oliver Barre, MD [address] [place] [address] Dr. Chipper Herb Dr. 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 Patient was seen in the multidisciplinary breast clinic for discussion of treatment options.  STAGE:   Breast cancer of lower-outer quadrant of right female breast   Primary site: Breast (Right)   Staging method: AJCC 7th Edition   Clinical: Stage IIA (T2, N0, cM0)   Summary: Stage IIA (T2, N0, cM0)  REFERRING PHYSICIAN: Dr. Cyndia Bent  Prior therapy:  Monica Lee is a 62 y.o. female.   #1 Who felt a mass along the inferior aspect of the right breast at about the 7:00 position. On 10/08/2012 she had a mammogram performed that showed a irregular mass with internal pleomorphic calcifications. Ultrasound showed a suspicious 2.1 x 1.3 x 1.6 cm mass in the inframammary fold. A needle core biopsy performed on the same day was diagnostic for invasive ductal carcinoma with associated DCIS. The tumor was ER negative PR negative and HER-2/neu negative. The proliferation marker Ki-67 was elevated at 50% and the tumor was grade 2.   #2 patient is status post right lumpectomy with sentinel lymph node biopsy. Her final pathology revealed 2.2 cm invasive ductal carcinoma that was grade 3 that was triple negative with a proliferation marker Ki-67 at 56%.  #3 adjuvant chemotherapy beginning December 2014 with CMF every 21 days for total of 6 cycles   Current therapy: Curative intent adjuvant chemotherapy  Interval history: Patient is seen in followup today. Postoperatively she is doing well without any significant problems. She denies any fevers chills night sweats headaches. She is fatigued and she has some tenderness in the right breast from the surgical site. Remainder of the 10 point review of systems is  unremarkable.  Past Medical History: Past Medical History  Diagnosis Date  . Diabetes mellitus   . Acid reflux   . S/P kidney transplant   . GERD (gastroesophageal reflux disease)   . Hyperlipidemia   . DJD (degenerative joint disease)   . TIA (transient ischemic attack)   . Hx of colonic polyps   . PUD (peptic ulcer disease)   . Diabetic retinopathy   . Hemorrhoids   . Generalized headaches   . Hypertension   . Asthma   . MI (myocardial infarction) 2011  . Kidney [place]   . Mental disorder   . Stroke 2008    no deficits  . Anemia     Hx-not current  . Breast cancer   . Blind left eye   . Pneumonia   . Chronic diastolic CHF (congestive heart failure)     a. Cardiac MRI (10/2012): Moderate to severe LVH, EF 55%, chordal SAM but no LVOT gradient, mod circumferential effusion, mild LAE.  b.  Echocardiogram (11/06/12): Severe LVH, EF 60-65%, grade 1 diastolic dysfunction, moderate effusion.  Marland Kitchen Hx of cardiovascular stress test     a. Lexiscan Myoview (11/05/12): High-risk scan; fixed defect affecting the entire inferolateral and anterolateral wall; mid/apical anterior and mid/apical inferior moderate ischemia, EF 28%.  (felt to be false + as echo and cMRI with normal EF and normal WM)  . Hx of cardiac cath     a. reportedly no CAD on cardiac cath in [place]    Past Surgical History: Past Surgical History  Procedure Laterality Date  .  Transplantation renal  05/2006  . Cardiac catheterization  2011  . Abdominal hysterectomy    . Cataract extraction  bil  . Intestinal perforation surgery  01/2009  . Breast biopsy  2010  . Gallstones removed    . Cholecystectomy  2008  . Colon surgery  2013    polyps removed  . Hemorrhoid surgery  01/23/2012    Procedure: HEMORRHOIDECTOMY PROLAPSED;  Surgeon: Romie Levee, MD;  Location: [facility];  Service: General;  Laterality: N/A;  . Eye surgery Left 1997  . Foot surgery Right 2013  . Breast lumpectomy with sentinel  lymph node biopsy Right 11/09/2012    Procedure: RIGHT BREAST LUMPECTOMY WITH SENTINEL LYMPH NODE REMOVAL;  Surgeon: Currie Paris, MD;  Location: [facility] OR;  Service: General;  Laterality: Right;    Family History: Family History  Problem Relation Age of Onset  . Kidney disease Neg Hx   . Arthritis Other     parent  . Heart disease Other     parent  . Hypertension Other     parent  . Diabetes Other     parent  . Diabetes Other     grandparent  . Thyroid disease Other     several  . Heart disease Father     Social History History  Substance Use Topics  . Smoking status: Never Smoker   . Smokeless tobacco: Never Used  . Alcohol Use: No    Allergies: Allergies  Allergen Reactions  . Cephalexin Swelling    Swelling to face, chills  . Propoxyphene-Acetaminophen Hives and Itching    Current Medications: Current Outpatient Prescriptions  Medication Sig Dispense Refill  . albuterol (ACCUNEB) 0.63 MG/3ML nebulizer solution Take 3 mLs (0.63 mg total) by nebulization every 6 (six) hours as needed for wheezing.  75 mL  12  . albuterol (PROVENTIL HFA;VENTOLIN HFA) 108 (90 BASE) MCG/ACT inhaler Inhale 2 puffs into the lungs every 6 (six) hours as needed for wheezing.      Marland Kitchen amLODipine (NORVASC) 10 MG tablet Take 1 tablet (10 mg total) by mouth daily.  90 tablet  3  . aspirin EC 325 MG tablet Take 325 mg by mouth daily.        Marland Kitchen azaTHIOprine (IMURAN) 50 MG tablet Take 50 mg by mouth 2 (two) times daily.        . brimonidine (ALPHAGAN) 0.2 % ophthalmic solution Place 1 drop into the right eye 2 (two) times daily.      . cinacalcet (SENSIPAR) 30 MG tablet Take 1 tablet (30 mg total) by mouth daily.  30 tablet  11  . docusate sodium (COLACE) 100 MG capsule Take 1 capsule (100 mg total) by mouth 2 (two) times daily.  60 capsule  0  . esomeprazole (NEXIUM) 40 MG capsule Take 40 mg by mouth daily before breakfast.      . furosemide (LASIX) 80 MG tablet Take 160 mg by mouth 2 (two)  times daily.       Marland Kitchen HYDROcodone-acetaminophen (NORCO) 5-325 MG per tablet Take 1 tablet by mouth every 4 (four) hours as needed for pain.  30 tablet  0  . insulin aspart (NOVOLOG) 100 UNIT/ML injection Inject 13-16 Units into the skin 3 (three) times daily. Injects 13 units daily at breakfast, 13 units daily at lunch, and 16 units daily at supper.      . insulin glargine (LANTUS) 100 UNIT/ML injection Inject 55 Units into the skin at bedtime.      Marland Kitchen  isosorbide mononitrate (IMDUR) 60 MG 24 hr tablet Take 60 mg by mouth daily.      . Magnesium Oxide 250 MG TABS Take 250 mg by mouth daily.       . metoprolol (LOPRESSOR) 100 MG tablet Take 100 mg by mouth 2 (two) times daily.      . minoxidil (LONITEN) 2.5 MG tablet Take 2.5 mg by mouth 2 (two) times daily.      . Multiple Vitamin (MULTIVITAMIN) capsule Take 1 capsule by mouth daily.        . nitroGLYCERIN (NITROSTAT) 0.4 MG SL tablet Place 1 tablet (0.4 mg total) under the tongue every 5 (five) minutes as needed for chest pain.  25 tablet  11  . polyethylene glycol (MIRALAX / GLYCOLAX) packet Take 17 g by mouth daily.      . potassium chloride (KLOR-CON 10) 10 MEQ tablet Take 1 tablet (10 mEq total) by mouth 2 (two) times daily.  90 tablet  3  . pravastatin (PRAVACHOL) 20 MG tablet Take 10 mg by mouth at bedtime.        . prednisoLONE acetate (PRED FORTE) 1 % ophthalmic suspension Place 1 drop into the left eye 3 (three) times daily.      . predniSONE (DELTASONE) 10 MG tablet Take 10 mg by mouth daily.      . tacrolimus (PROGRAF) 1 MG capsule Take 9 mg by mouth 2 (two) times daily.       . timolol (TIMOPTIC) 0.25 % ophthalmic solution Place 1 drop into the right eye 2 (two) times daily.      . traMADol (ULTRAM) 50 MG tablet Take 50 mg by mouth every 6 (six) hours as needed for pain.       No current facility-administered medications for this visit.    OB/GYN History:menarche at age 62 she is postmenopausal she has never been on hormone  replacement therapy first live birth was at 9.  Fertility Discussion: not applicable Prior History of Cancer: no prior history of malignancies  Health Maintenance:  Colonoscopy yes 2013 Bone Density yes date unknown Last PAP smear unknown  ECOG PERFORMANCE STATUS: 1 - Symptomatic but completely ambulatory  Genetic Counseling/testing: no  REVIEW OF SYSTEMS:  The 14 point comprehensive review of system was obtained and it is scant separately into the electronic medical record  PHYSICAL EXAMINATION: Blood pressure 121/65, pulse 72, temperature 97.7 F (36.5 C), temperature source Oral, resp. rate 18, height 5\' 5"  (1.651 m), weight 198 lb 9.6 oz (90.084 kg).  ZOX:WRUEA, healthy, no distress, well nourished and well developed SKIN: skin color, texture, turgor are normal HEAD: Normocephalic EYES: PERRLA, EOMI, Conjunctiva are pink and non-injected EARS: External ears normal OROPHARYNX:no exudate and no erythema  NECK: supple, no adenopathy LYMPH:  no palpable lymphadenopathy, no hepatosplenomegaly BREAST: Right breast and axillary surgical scar looks good. Left breast no masses or nipple discharge LUNGS: clear to auscultation and percussion HEART: regular rate & rhythm ABDOMEN:abdomen soft, non-tender, normal bowel sounds and no masses or organomegaly BACK: No CVA tenderness EXTREMITIES:no edema, no clubbing, no cyanosis  NEURO: alert & oriented x 3 with fluent speech, no focal motor/sensory deficits, gait normal, reflexes normal and symmetric     STUDIES/RESULTS: US Breast Right  10/08/2012   CLINICAL DATA:  Palpable area of concern in the 7 o'clock region of the right breast.  EXAM: DIGITAL DIAGNOSTIC BILATERALMAMMOGRAM WITH CAD  ULTRASOUND RIGHT BREAST  COMPARISON:  WITH PRIORS  ACR Breast Density Category c: The  breast tissue is heterogeneously dense.  FINDINGS: In the deep aspect of the lower outer quadrant of the right breast is an irregular mass with internal pleomorphic  calcifications.  No mass, suspicious microcalcification, or distortion is identified in the left breast to suggest malignancy.  Mammographic images were processed with CAD.  On physical exam there is a firm approximately 2.5 cm mass in the 7 o'clock position right breast approximately 9 cm from the nipple near the inframammary fold. No additional mass is palpated in the right breast. No axillary lymphadenopathy is palpated on the right.  Ultrasound is performed, showing an irregular heterogeneous and hypoechoic mass measuring approximately 2.1 x 1.3 x 1.6 cm. There is internal vascular flow. Small echogenicities within the mass are consistent with the suspicious calcifications seen on mammography.  Ultrasound the right axilla demonstrates a normal axillary lymph node. No lymphadenopathy is detected.  IMPRESSION: Suspicious 2.1 x 1.3 x 1.6 cm palpable mass 10 o'clock position right breast near the inframammary fold. Findings are suggestive of malignancy and ultrasound-guided biopsy is recommended and will be performed today and dictated separately.  RECOMMENDATION: Ultrasound-guided biopsy of a right breast mass.  I have discussed the findings and recommendations with the patient. Results were also provided in writing at the conclusion of the visit. If applicable, a reminder letter will be sent to the patient regarding the next appointment.  BI-RADS CATEGORY  5: Highly suggestive of malignancy - appropriate action should be taken.   Electronically Signed   By: Britta Mccreedy   On: 10/08/2012 16:45   Mm Digital Diagnostic Bilat  10/08/2012   CLINICAL DATA:  Palpable area of concern in the 7 o'clock region of the right breast.  EXAM: DIGITAL DIAGNOSTIC BILATERALMAMMOGRAM WITH CAD  ULTRASOUND RIGHT BREAST  COMPARISON:  WITH PRIORS  ACR Breast Density Category c: The breast tissue is heterogeneously dense.  FINDINGS: In the deep aspect of the lower outer quadrant of the right breast is an irregular mass with internal  pleomorphic calcifications.  No mass, suspicious microcalcification, or distortion is identified in the left breast to suggest malignancy.  Mammographic images were processed with CAD.  On physical exam there is a firm approximately 2.5 cm mass in the 7 o'clock position right breast approximately 9 cm from the nipple near the inframammary fold. No additional mass is palpated in the right breast. No axillary lymphadenopathy is palpated on the right.  Ultrasound is performed, showing an irregular heterogeneous and hypoechoic mass measuring approximately 2.1 x 1.3 x 1.6 cm. There is internal vascular flow. Small echogenicities within the mass are consistent with the suspicious calcifications seen on mammography.  Ultrasound the right axilla demonstrates a normal axillary lymph node. No lymphadenopathy is detected.  IMPRESSION: Suspicious 2.1 x 1.3 x 1.6 cm palpable mass 10 o'clock position right breast near the inframammary fold. Findings are suggestive of malignancy and ultrasound-guided biopsy is recommended and will be performed today and dictated separately.  RECOMMENDATION: Ultrasound-guided biopsy of a right breast mass.  I have discussed the findings and recommendations with the patient. Results were also provided in writing at the conclusion of the visit. If applicable, a reminder letter will be sent to the patient regarding the next appointment.  BI-RADS CATEGORY  5: Highly suggestive of malignancy - appropriate action should be taken.   Electronically Signed   By: Britta Mccreedy   On: 10/08/2012 16:45   Mm Digital Diagnostic Unilat R  10/08/2012   CLINICAL DATA:  Ultrasound-guided biopsy of right breast  mass was performed.  EXAM: DIGITAL DIAGNOSTIC UNILATERAL RIGHT MAMMOGRAM  COMPARISON:  Previous exams.  FINDINGS: Films are performed following ultrasound guided biopsy of probable some suspicious right breast mass at 7 o'clock position near the inframammary fold. A dumbbell shaped biopsy clip is in  satisfactory position within the mass.  IMPRESSION: Satisfactory position of biopsy clip in the right breast mass.  Final Assessment: Post Procedure Mammograms for Marker Placement   Electronically Signed   By: Britta Mccreedy   On: 10/08/2012 16:48   Mr Card Morphology W/o Cm  10/29/2012   CLINICAL DATA:  Cardiomyopathy:  Assess EF  EXAM: CARDIAC MRI  TECHNIQUE: The patient was scanned on a 1.5 Tesla GE magnet. A dedicated cardiac coil was used. Functional imaging was done using Fiesta sequences. 2,3, and 4 chamber views were done to assess for RWMA's. Modified Simpson's rule using a short axis stack was used to calculate an ejection fraction on a dedicated work [employer]. No multihance was used due to the patient previous renal transplant.  CONTRAST:  None  FINDINGS: There was moderate to severel LVH with septal thickness 15mm. There was a moderate circumferential pericardial effusion. There was mild LAE Right sided cardiac chambers were normal with no diastolic flattening. The aortic valve had some turbulence to flow in systole There was no significant MR or AR. The LV cavity size was small with prominent papillary muscles and a narrow LVOT. There was chordal SAM with no evidence of sub valvular obstruction.  There were no discrete RWMA;s Quantitative EF was 55%(EDV 130 cc ESV 59 cc and SV 71cc) Global longitudinal strain was normal at 1 21.5%  IMPRESSION: 1) Moderate to severe LVH with normal EF 55% Small LV cavity size with chordal SAM but o LVOT gradient or true HOCM  2) Normal longitudinal strain -21.5%  3) Moderate circumferential pericardial effusion  4) Normal RV/RA  5) Mild LAE  No gadolinium given so no infarct imaging done due to patients history of renal transplant  Charlton Haws   Electronically Signed   By: Charlton Haws M.D.   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 This is concordant. The patient was notified of results the afternoon of 10/09/2012 by Dr. Jean Rosenthal. She has no complaints regarding her biopsy site. She is scheduled for multidisciplinary clinic on 10/14/2012. Due to the patient's renal transplant, no breast MRI is scheduled.   Electronically Signed   By: Jerene Dilling M.D.   On: 10/09/2012 16:46   10/09/2012   CLINICAL DATA:  Suspicious palpable mass 10 o'clock position right breast.  EXAM: US BREAST BX W LOC DEV 1ST LESION IMG BX SPEC US GUIDE*R*  COMPARISON:  Previous exams.  PROCEDURE: I met with the patient and we discussed the procedure of ultrasound-guided biopsy, including benefits and alternatives. We discussed the high likelihood of a successful procedure. We discussed the risks of the procedure including infection, bleeding, tissue injury, clip migration, and inadequate sampling. Informed written consent was given.  Using sterile technique and 2% Lidocaine as local anesthetic, under direct ultrasound visualization, a 12 gauge vacuum-assisteddevice was used to perform biopsy of an approximate 2.1 cm solid mass at the 7 o'clock position 9 cm from the nipple using a lateral to medial approach. At the conclusion of the procedure, a  dumbbell-shaped tissue marker clip was deployed into the biopsy cavity. Follow-up 2-view mammogram was performed and dictated separately.  The usual time-out protocol was performed immediately prior to the procedure.  IMPRESSION: Ultrasound-guided biopsy of right breast mass. No apparent complications.  Electronically Signed: By: Britta Mccreedy On: 10/08/2012 16:47     LABS:    Chemistry      Component Value Date/Time   NA 139 11/14/2012 1705   NA 140 10/14/2012 0825   K 4.1 11/14/2012 1705   K 4.1 10/14/2012 0825   CL 99 11/14/2012 1705   CO2 28 11/14/2012 1705   CO2 31* 10/14/2012 0825   BUN 29* 11/14/2012 1705   BUN 27.3* 10/14/2012 0825    CREATININE 1.68* 11/14/2012 1705   CREATININE 1.9* 10/14/2012 0825      Component Value Date/Time   CALCIUM 8.4 11/14/2012 1705   CALCIUM 9.0 10/14/2012 0825   ALKPHOS 51 11/11/2012 0610   ALKPHOS 42 10/14/2012 0825   AST 31 11/11/2012 0610   AST 25 10/14/2012 0825   ALT 28 11/11/2012 0610   ALT 12 10/14/2012 0825   BILITOT 0.2* 11/11/2012 0610   BILITOT 0.28 10/14/2012 0825      Lab Results  Component Value Date   WBC 6.1 11/11/2012   HGB 12.0 11/11/2012   HCT 38.3 11/11/2012   MCV 94.8 11/11/2012   PLT 192 11/11/2012   PATHOLOGY: SpADDITIONAL INFORMATION: 1. CHROMOGENIC IN-SITU HYBRIDIZATION Results: HER-2/NEU BY CISH - NO AMPLIFICATION OF HER-2 DETECTED. RESULT RATIO OF HER2: CEP 17 SIGNALS 1.38 AVERAGE HER2 COPY NUMBER PER CELL 2.2 REFERENCE RANGE NEGATIVE HER2/Chr17 Ratio <2.0 and Average HER2 copy number <4.0 EQUIVOCAL HER2/Chr17 Ratio <2.0 and Average HER2 copy number 4.0 and <6.0 POSITIVE HER2/Chr17 Ratio >=2.0 and/or Average HER2 copy number >=6.0 Abigail Miyamoto MD Pathologist, Electronic Signature ( Signed 11/17/2012) 1. PROGNOSTIC INDICATORS - ACIS Results: IMMUNOHISTOCHEMICAL AND MORPHOMETRIC ANALYSIS BY THE AUTOMATED CELLULAR IMAGING SYSTEM (ACIS) Estrogen Receptor: 0%, NEGATIVE COMMENT: The negative hormone receptor study(ies) in this case has an internal positive control. REFERENCE RANGE ESTROGEN RECEPTOR NEGATIVE <1% POSITIVE =>1% PROGESTERONE RECEPTOR 1 of 4 FINAL for Sponaugle, Taurus T ([id number]) ADDITIONAL INFORMATION:(continued) NEGATIVE <1% POSITIVE =>1% All controls stained appropriately Pecola Leisure MD Pathologist, Electronic Signature ( Signed 11/13/2012) FINAL DIAGNOSIS Diagnosis 1. 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 - MICROCALCIFICATIONS WITHIN CARCINOMA. - DUCTAL CARCINOMA IN SITU, HIGH GRADE. - RESECTION MARGINS ARE NEGATIVE FOR CARCINOMA. - SEE ONCOLOGY TABLE. 2. Lymph node,  sentinel, biopsy, Right axillary #1 - ONE BENIGN LYMPH NODE (0/1). 3. Lymph node, sentinel, biopsy, Right axillary #2 - ONE BENIGN LYMPH NODE (0/1). 4. Lymph node, sentinel, biopsy, Right axillary #3 - ONE BENIGN LYMPH NODE (0/1). Microscopic Comment 1. BREAST, INVASIVE TUMOR, WITH LYMPH NODE SAMPLING Specimen, including laterality and lymph node sampling (sentinel, non-sentinel): Right breast lumpectomy and right axillary sentinel lymph nodes. Procedure: Right breast lumpectomy. Histologic type: Invasive ductal carcinoma. Grade: III Tubule formation: 3 Nuclear pleomorphism: 3 Mitotic: 2 Tumor size (gross measurement): 2.2 cm Margins: Negative. Invasive, distance to closest margin: 0.3 cm In-situ, distance to closest margin: 0.3 cm If margin positive, focally or broadly: N/A. Lymphovascular invasion: Absent. Ductal carcinoma in situ: Present. Grade: High grade. Extensive intraductal component: Absent. Lobular neoplasia: Absent. Tumor focality: Unifocal. Treatment effect: N/A. Extent of tumor: Confined to breast. Lymph nodes: 2 of 4 FINAL for Cheong, Marytza T ([id number]) Microscopic Comment(continued) Examined: 3 Sentinel 0 Non-sentinel 3 Total Lymph  nodes with metastasis: 0 Isolated tumor cells (< 0.2 mm): 0 Micrometastasis: (> 0.2 mm and < 2.0 mm): 0 Macrometastasis: (> 2.0 mm): 0 Extracapsular extension: N/A Breast prognostic profile: [id number]. ER and Her 2 neu will be repeated on the current specimen and reported separately. Estrogen receptor: Negative. Progesterone receptor: Negative. Her 2 neu: Not amplified. Ki-67: 56% Non-neoplastic breast: Fibrocystic change, biopsy site change. TNM: pT2 pN0(SN) Valinda Hoar MD Pathologist, Electronic Signature (Case signed 11/11/2012) cimen Gross and Clinical Information ASSESSMENT    62 year old female with  #1 new diagnosis of stage II invasive ductal carcinoma that is ER negative PR negative HER-2/neu negative  with a proliferation marker Ki-67 of 56%. Patient is status post lumpectomy of the right breast with sentinel lymph node biopsy. The final pathology did reveal a 2.2 cm grade 3 disease that was triple negative. Sentinel nodes were negative for metastatic disease.  #2 patient and I discussed her pathology. I have recommended proceeding with adjuvant chemotherapy. Do to her multiple medical problems including history of renal transplant I have recommended that we begin her on CMF every 21 days for a total of 6 cycles. We discussed side effects of this.  #3 patient will need a Port-A-Cath placed, she will need chemotherapy education class. My plan is to get her started in the first week of December 2014. I have sent a message to Dr. Jamey Ripa for Port-A-Cath placement.  PLAN: #1 proceed with adjuvant curative intent chemotherapy starting in the first week of December.  #2 Port-A-Cath placement and chemotherapy teaching class.   Thank you so much for allowing me to participate in the care of Monica Lee. I will continue to follow up the patient with you and assist in her care.  All questions were answered. The patient knows to call the clinic with any problems, questions or concerns. We can certainly see the patient much sooner if necessary.    The length of time of the face-to-face encounter was 60    minutes. More than 50% of time was spent counseling and coordination of care.  Drue Second, MD Medical/Oncology [facility] [phone] (beeper) [phone] (Office)

## 2012-11-23 NOTE — Patient Instructions (Addendum)
We discussed her final pathology.  We discussed treatment options including chemotherapy since your tumor was triple negative.  I have recommended giving you CMF combination chemotherapy (cyclophosphamide, methotrexate, 5-FU)  CMF is given every 3 weeks. We will plan on doing a total of 6 cycles  You will need chemotherapy teaching class.  I will ask Dr. Jamey Ripa to place a Port-A-Cath to give you chemotherapy safely  You will need antinausea medications have sent some prescription to your pharmacy. The rest were given to you today.

## 2012-11-23 NOTE — Telephone Encounter (Signed)
Per staff message and POF I have scheduled appts.  JMW  

## 2012-11-24 ENCOUNTER — Other Ambulatory Visit: Payer: Self-pay

## 2012-11-24 ENCOUNTER — Telehealth (INDEPENDENT_AMBULATORY_CARE_PROVIDER_SITE_OTHER): Payer: Self-pay | Admitting: General Surgery

## 2012-11-24 ENCOUNTER — Other Ambulatory Visit (INDEPENDENT_AMBULATORY_CARE_PROVIDER_SITE_OTHER): Payer: Self-pay | Admitting: Surgery

## 2012-11-24 MED ORDER — POLYETHYLENE GLYCOL 3350 17 G PO PACK: 17.0000 g | PACK | Freq: Every day | ORAL | Status: DC

## 2012-11-24 NOTE — Telephone Encounter (Signed)
Message copied by Liliana Cline on Tue Nov 24, 2012  8:31 AM ------      Message from: Marianne Sofia C      Created: Mon Nov 23, 2012  4:51 PM      Regarding: FW: Port placement       [place],             Will you help facilitate getting orders for this port to be placed?            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 She will need chemotherapy. My plan is to begin on 12/14/2012.            Patient will need a Port-A-Cath placed as soon as possible            Thank you      KK       ------

## 2012-11-24 NOTE — Telephone Encounter (Signed)
This patient needs some orders sent to scheduling for a PAC placement.

## 2012-11-25 ENCOUNTER — Telehealth: Payer: Self-pay | Admitting: *Deleted

## 2012-11-25 ENCOUNTER — Encounter (HOSPITAL_BASED_OUTPATIENT_CLINIC_OR_DEPARTMENT_OTHER): Payer: Self-pay | Admitting: *Deleted

## 2012-11-25 NOTE — Progress Notes (Signed)
Had ekg and labs 11/14-had lumpectomy 11/08/12-in hospital 2 days later in chf-tx-better-has seen cardiology fu-stable

## 2012-11-25 NOTE — Telephone Encounter (Signed)
Per staff message and POF I have moved appt from 12/1 to 12/2

## 2012-11-25 NOTE — Telephone Encounter (Signed)
sw pt informed her that her appts was moved from 12/1 to 12/15/12. gv appts for 12/15/12 w/ labs@ 11:15am, ov@ 11:45am, and tx to follow. i emailed MW to move the tx form 12/1 to 12/2 per [place] email...td

## 2012-11-26 ENCOUNTER — Ambulatory Visit (INDEPENDENT_AMBULATORY_CARE_PROVIDER_SITE_OTHER): Payer: Medicare Other | Admitting: Surgery

## 2012-11-26 ENCOUNTER — Encounter (INDEPENDENT_AMBULATORY_CARE_PROVIDER_SITE_OTHER): Payer: Self-pay | Admitting: Surgery

## 2012-11-26 VITALS — BP 127/83 | HR 68 | Temp 99.1°F | Resp 14 | Ht 65.0 in | Wt 198.0 lb

## 2012-11-26 DIAGNOSIS — Z09 Encounter for follow-up examination after completed treatment for conditions other than malignant neoplasm: Secondary | ICD-10-CM

## 2012-11-26 NOTE — Progress Notes (Signed)
Reviewed recent notes with dr frederick-ok for [facility]

## 2012-11-26 NOTE — Patient Instructions (Signed)
Will see you MOnday to put the port in

## 2012-11-26 NOTE — Progress Notes (Signed)
NAME: Monica Lee                                            DOB: May 15, 1950 DATE: 11/26/2012                                                   MRN: [id number]  CC:  Chief Complaint  Patient presents with  . Routine Post Op    right lumpectomy/SLN 11/09/12    HPI: This patient comes in for post op follow-up .Sheunderwent right lumpectomy and SLN on 11/09/12. She feels that she is doing well. She is going to need a port for post op chemo  PE:  VITAL SIGNS: BP 127/83  Pulse 68  Temp(Src) 99.1 F (37.3 C) (Oral)  Resp 14  Ht 5\' 5"  (1.651 m)  Wt 198 lb (89.812 kg)  BMI 32.95 kg/m2  General: The patient appears to be healthy, NAD The breast incision is healing nicely. The axillary incision is clean, but there is a moderate amountof swelling. Aspiration retrieved about 5 cc blood, so I think the rest is hematoma. No evidence of infection  DATA REVIEWED: ADDITIONAL INFORMATION: 1. CHROMOGENIC IN-SITU HYBRIDIZATION Results: HER-2/NEU BY CISH - NO AMPLIFICATION OF HER-2 DETECTED. RESULT RATIO OF HER2: CEP 17 SIGNALS 1.38 AVERAGE HER2 COPY NUMBER PER CELL 2.2 REFERENCE RANGE NEGATIVE HER2/Chr17 Ratio <2.0 and Average HER2 copy number <4.0 EQUIVOCAL HER2/Chr17 Ratio <2.0 and Average HER2 copy number 4.0 and <6.0 POSITIVE HER2/Chr17 Ratio >=2.0 and/or Average HER2 copy number >=6.0 Abigail Miyamoto MD Pathologist, Electronic Signature ( Signed 11/17/2012) 1. PROGNOSTIC INDICATORS - ACIS Results: IMMUNOHISTOCHEMICAL AND MORPHOMETRIC ANALYSIS BY THE AUTOMATED CELLULAR IMAGING SYSTEM (ACIS) Estrogen Receptor: 0%, NEGATIVE COMMENT: The negative hormone receptor study(ies) in this case has an internal positive control. REFERENCE RANGE ESTROGEN RECEPTOR NEGATIVE <1% POSITIVE =>1% PROGESTERONE RECEPTOR 1 of 4 FINAL for [place], Menna T ([id number]) ADDITIONAL INFORMATION:(continued) NEGATIVE <1% POSITIVE =>1% All controls stained appropriately Pecola Leisure MD Pathologist,  Electronic Signature ( Signed 11/13/2012) FINAL DIAGNOSIS Diagnosis 1. 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 - MICROCALCIFICATIONS WITHIN CARCINOMA. - DUCTAL CARCINOMA IN SITU, HIGH GRADE. - RESECTION MARGINS ARE NEGATIVE FOR CARCINOMA. - SEE ONCOLOGY TABLE. 2. Lymph node, sentinel, biopsy, Right axillary #1 - ONE BENIGN LYMPH NODE (0/1). 3. Lymph node, sentinel, biopsy, Right axillary #2 - ONE BENIGN LYMPH NODE (0/1). 4. Lymph node, sentinel, biopsy, Right axillary #3 - ONE BENIGN LYMPH NODE (0/1).  IMPRESSION: The patient is doing well S/P Lumpectomy and SLN, albeit with a hematoma in the axilla.    PLAN: Will put port in next week, re-eval hematoma at that time. Discussed port placement, risks etc and all questions answered

## 2012-11-30 ENCOUNTER — Ambulatory Visit (HOSPITAL_COMMUNITY): Payer: Medicare Other

## 2012-11-30 ENCOUNTER — Encounter (HOSPITAL_BASED_OUTPATIENT_CLINIC_OR_DEPARTMENT_OTHER): Payer: Medicare Other | Admitting: Certified Registered Nurse Anesthetist

## 2012-11-30 ENCOUNTER — Encounter (HOSPITAL_BASED_OUTPATIENT_CLINIC_OR_DEPARTMENT_OTHER)
Admission: RE | Disposition: A | Discharge status: Home / Self Care | Payer: Self-pay | Source: Ambulatory Visit | Attending: Surgery

## 2012-11-30 ENCOUNTER — Encounter: Payer: Self-pay | Admitting: *Deleted

## 2012-11-30 ENCOUNTER — Ambulatory Visit (HOSPITAL_BASED_OUTPATIENT_CLINIC_OR_DEPARTMENT_OTHER): Payer: Medicare Other | Admitting: Certified Registered Nurse Anesthetist

## 2012-11-30 ENCOUNTER — Encounter (HOSPITAL_BASED_OUTPATIENT_CLINIC_OR_DEPARTMENT_OTHER): Payer: Self-pay | Admitting: *Deleted

## 2012-11-30 ENCOUNTER — Ambulatory Visit (HOSPITAL_BASED_OUTPATIENT_CLINIC_OR_DEPARTMENT_OTHER)
Admission: RE | Admit: 2012-11-30 | Discharge: 2012-11-30 | Disposition: A | Discharge status: Home / Self Care | Payer: Medicare Other | Source: Ambulatory Visit | Attending: Surgery | Admitting: Surgery

## 2012-11-30 DIAGNOSIS — C50511 Malignant neoplasm of lower-outer quadrant of right female breast: Secondary | ICD-10-CM

## 2012-11-30 DIAGNOSIS — E119 Type 2 diabetes mellitus without complications: Secondary | ICD-10-CM | POA: Insufficient documentation

## 2012-11-30 DIAGNOSIS — R918 Other nonspecific abnormal finding of lung field: Secondary | ICD-10-CM | POA: Diagnosis not present

## 2012-11-30 DIAGNOSIS — K219 Gastro-esophageal reflux disease without esophagitis: Secondary | ICD-10-CM | POA: Insufficient documentation

## 2012-11-30 DIAGNOSIS — I10 Essential (primary) hypertension: Secondary | ICD-10-CM | POA: Insufficient documentation

## 2012-11-30 DIAGNOSIS — Z794 Long term (current) use of insulin: Secondary | ICD-10-CM | POA: Insufficient documentation

## 2012-11-30 DIAGNOSIS — C50919 Malignant neoplasm of unspecified site of unspecified female breast: Secondary | ICD-10-CM | POA: Insufficient documentation

## 2012-11-30 HISTORY — PX: PORTACATH PLACEMENT: SHX2246

## 2012-11-30 HISTORY — DX: Presence of spectacles and contact lenses: Z97.3

## 2012-11-30 LAB — GLUCOSE, CAPILLARY

## 2012-11-30 SURGERY — INSERTION, TUNNELED CENTRAL VENOUS DEVICE, WITH PORT
Anesthesia: General | Site: Chest | Laterality: Right | Wound class: Clean

## 2012-11-30 MED ORDER — MIDAZOLAM HCL 2 MG/2ML IJ SOLN
INTRAMUSCULAR | Status: AC
  Filled 2012-11-30: qty 2

## 2012-11-30 MED ORDER — HEPARIN (PORCINE) IN NACL 2-0.9 UNIT/ML-% IJ SOLN
INTRAMUSCULAR | Status: AC
  Filled 2012-11-30: qty 500

## 2012-11-30 MED ORDER — ONDANSETRON HCL 4 MG/2ML IJ SOLN: 4.0000 mg | Freq: Once | INTRAMUSCULAR | Status: DC | PRN

## 2012-11-30 MED ORDER — CHLORHEXIDINE GLUCONATE 4 % EX LIQD: 1.0000 "application " | Freq: Once | CUTANEOUS | Status: DC

## 2012-11-30 MED ORDER — HYDROMORPHONE HCL PF 1 MG/ML IJ SOLN: 0.2500 mg | INTRAMUSCULAR | Status: DC | PRN

## 2012-11-30 MED ORDER — HEPARIN (PORCINE) IN NACL 2-0.9 UNIT/ML-% IJ SOLN
INTRAMUSCULAR | Status: DC | PRN
  Administered 2012-11-30: 500 mL via INTRAVENOUS

## 2012-11-30 MED ORDER — FENTANYL CITRATE 0.05 MG/ML IJ SOLN
INTRAMUSCULAR | Status: DC | PRN
  Administered 2012-11-30: 50 ug via INTRAVENOUS

## 2012-11-30 MED ORDER — MEPERIDINE HCL 25 MG/ML IJ SOLN: 6.2500 mg | INTRAMUSCULAR | Status: DC | PRN

## 2012-11-30 MED ORDER — CIPROFLOXACIN IN D5W 400 MG/200ML IV SOLN
INTRAVENOUS | Status: AC
  Filled 2012-11-30: qty 200

## 2012-11-30 MED ORDER — CIPROFLOXACIN IN D5W 400 MG/200ML IV SOLN
INTRAVENOUS | Status: DC | PRN
  Administered 2012-11-30: 400 mg via INTRAVENOUS

## 2012-11-30 MED ORDER — PHENYLEPHRINE HCL 10 MG/ML IJ SOLN
10.0000 mg | INTRAVENOUS | Status: DC | PRN
  Administered 2012-11-30: 40 ug/min via INTRAVENOUS

## 2012-11-30 MED ORDER — PROPOFOL 10 MG/ML IV BOLUS
INTRAVENOUS | Status: DC | PRN
  Administered 2012-11-30: 100 mg via INTRAVENOUS

## 2012-11-30 MED ORDER — FENTANYL CITRATE 0.05 MG/ML IJ SOLN
INTRAMUSCULAR | Status: AC
  Filled 2012-11-30: qty 4

## 2012-11-30 MED ORDER — LACTATED RINGERS IV SOLN
INTRAVENOUS | Status: DC
  Administered 2012-11-30: 11:00:00 via INTRAVENOUS

## 2012-11-30 MED ORDER — FENTANYL CITRATE 0.05 MG/ML IJ SOLN: 50.0000 ug | INTRAMUSCULAR | Status: DC | PRN

## 2012-11-30 MED ORDER — ONDANSETRON HCL 4 MG/2ML IJ SOLN
INTRAMUSCULAR | Status: DC | PRN
  Administered 2012-11-30: 4 mg via INTRAVENOUS

## 2012-11-30 MED ORDER — OXYCODONE HCL 5 MG PO TABS
ORAL_TABLET | ORAL | Status: AC
  Filled 2012-11-30: qty 1

## 2012-11-30 MED ORDER — CIPROFLOXACIN IN D5W 400 MG/200ML IV SOLN
400.0000 mg | INTRAVENOUS | Status: AC
  Administered 2012-11-30: 400 mg via INTRAVENOUS

## 2012-11-30 MED ORDER — OXYCODONE HCL 5 MG/5ML PO SOLN: 5.0000 mg | Freq: Once | ORAL | Status: AC | PRN

## 2012-11-30 MED ORDER — LIDOCAINE HCL (CARDIAC) 20 MG/ML IV SOLN
INTRAVENOUS | Status: DC | PRN
  Administered 2012-11-30: 60 mg via INTRAVENOUS

## 2012-11-30 MED ORDER — OXYCODONE HCL 5 MG PO TABS
5.0000 mg | ORAL_TABLET | Freq: Once | ORAL | Status: AC | PRN
  Administered 2012-11-30: 5 mg via ORAL

## 2012-11-30 MED ORDER — HEPARIN SOD (PORK) LOCK FLUSH 100 UNIT/ML IV SOLN
INTRAVENOUS | Status: DC | PRN
  Administered 2012-11-30: 500 [IU] via INTRAVENOUS

## 2012-11-30 MED ORDER — MIDAZOLAM HCL 2 MG/2ML IJ SOLN: 1.0000 mg | INTRAMUSCULAR | Status: DC | PRN

## 2012-11-30 MED ORDER — MIDAZOLAM HCL 5 MG/5ML IJ SOLN
INTRAMUSCULAR | Status: DC | PRN
  Administered 2012-11-30: 1 mg via INTRAVENOUS

## 2012-11-30 MED ORDER — HEPARIN SOD (PORK) LOCK FLUSH 100 UNIT/ML IV SOLN
INTRAVENOUS | Status: AC
  Filled 2012-11-30: qty 5

## 2012-11-30 MED ORDER — DEXAMETHASONE SODIUM PHOSPHATE 4 MG/ML IJ SOLN
INTRAMUSCULAR | Status: DC | PRN
  Administered 2012-11-30: 10 mg via INTRAVENOUS

## 2012-11-30 MED ORDER — BUPIVACAINE HCL (PF) 0.25 % IJ SOLN
INTRAMUSCULAR | Status: AC
  Filled 2012-11-30: qty 30

## 2012-11-30 MED ORDER — CHLORHEXIDINE GLUCONATE 4 % EX LIQD
1.0000 | Freq: Once | CUTANEOUS | Status: DC
Start: 2012-12-01 — End: 2012-11-30

## 2012-11-30 SURGICAL SUPPLY — 46 items
BAG DECANTER FOR FLEXI CONT (MISCELLANEOUS) ×2 IMPLANT
BENZOIN TINCTURE PRP APPL 2/3 (GAUZE/BANDAGES/DRESSINGS) IMPLANT
BLADE HEX COATED 2.75 (ELECTRODE) ×2 IMPLANT
BLADE SURG 15 STRL LF DISP TIS (BLADE) ×1 IMPLANT
BLADE SURG 15 STRL SS (BLADE) ×1
CANISTER SUCT 1200ML W/VALVE (MISCELLANEOUS) IMPLANT
CHLORAPREP W/TINT 26ML (MISCELLANEOUS) ×2 IMPLANT
COVER MAYO STAND STRL (DRAPES) ×2 IMPLANT
COVER TABLE BACK 60X90 (DRAPES) ×2 IMPLANT
DECANTER SPIKE VIAL GLASS SM (MISCELLANEOUS) ×2 IMPLANT
DERMABOND ADVANCED (GAUZE/BANDAGES/DRESSINGS) ×1
DERMABOND ADVANCED .7 DNX12 (GAUZE/BANDAGES/DRESSINGS) ×1 IMPLANT
DRAPE C-ARM 42X72 X-RAY (DRAPES) ×2 IMPLANT
DRAPE LAPAROTOMY TRNSV 102X78 (DRAPE) ×2 IMPLANT
DRAPE UTILITY XL STRL (DRAPES) ×2 IMPLANT
ELECT REM PT RETURN 9FT ADLT (ELECTROSURGICAL) ×2
ELECTRODE REM PT RTRN 9FT ADLT (ELECTROSURGICAL) ×1 IMPLANT
GAUZE SPONGE 4X4 16PLY XRAY LF (GAUZE/BANDAGES/DRESSINGS) ×2 IMPLANT
GLOVE EUDERMIC 7 POWDERFREE (GLOVE) ×2 IMPLANT
GOWN PREVENTION PLUS XLARGE (GOWN DISPOSABLE) ×4 IMPLANT
IV CATH PLACEMENT UNIT 16 GA (IV SOLUTION) IMPLANT
IV KIT MINILOC 20X1 SAFETY (NEEDLE) IMPLANT
KIT BARDPORT ISP (Port) IMPLANT
KIT PORT POWER 8FR ISP CVUE (Catheter) ×4 IMPLANT
KIT PORT POWER ISP 8FR (Catheter) IMPLANT
KIT POWER PORT SLIM 6FR (PORTABLE EQUIPMENT SUPPLIES) IMPLANT
KIT PROBE COVER (MISCELLANEOUS) ×2 IMPLANT
NEEDLE HYPO 25X1 1.5 SAFETY (NEEDLE) ×2 IMPLANT
NEEDLE SPNL 22GX3.5 QUINCKE BK (NEEDLE) ×2 IMPLANT
PACK BASIN DAY SURGERY FS (CUSTOM PROCEDURE TRAY) ×2 IMPLANT
PENCIL BUTTON HOLSTER BLD 10FT (ELECTRODE) ×2 IMPLANT
SET SHEATH INTRODUCER 10FR (MISCELLANEOUS) IMPLANT
SHEATH COOK PEEL AWAY SET 9F (SHEATH) ×2 IMPLANT
SHEET MEDIUM DRAPE 40X70 STRL (DRAPES) IMPLANT
SLEEVE SCD COMPRESS KNEE MED (MISCELLANEOUS) ×2 IMPLANT
STAPLER VISISTAT (STAPLE) IMPLANT
STRIP CLOSURE SKIN 1/2X4 (GAUZE/BANDAGES/DRESSINGS) IMPLANT
SUT MNCRL AB 4-0 PS2 18 (SUTURE) ×2 IMPLANT
SUT PROLENE 2 0 SH DA (SUTURE) ×4 IMPLANT
SUT VICRYL 3-0 CR8 SH (SUTURE) ×2 IMPLANT
SYR 5ML LUER SLIP (SYRINGE) ×2 IMPLANT
SYR CONTROL 10ML LL (SYRINGE) ×4 IMPLANT
TOWEL OR 17X24 6PK STRL BLUE (TOWEL DISPOSABLE) ×2 IMPLANT
TOWEL OR NON WOVEN STRL DISP B (DISPOSABLE) IMPLANT
TUBE CONNECTING 20X1/4 (TUBING) IMPLANT
YANKAUER SUCT BULB TIP NO VENT (SUCTIONS) IMPLANT

## 2012-11-30 NOTE — Anesthesia Postprocedure Evaluation (Signed)
  Anesthesia Post-op Note  Patient: Monica Lee  Procedure(s) Performed: Procedure(s): INSERTION PORT-A-CATH (Right)  Patient Location: PACU  Anesthesia Type:General  Level of Consciousness: awake and alert   Airway and Oxygen Therapy: Patient Spontanous Breathing  Post-op Pain: mild  Post-op Assessment: Post-op Vital signs reviewed, Patient's Cardiovascular Status Stable and Respiratory Function Stable  Post-op Vital Signs: Reviewed  Filed Vitals:   11/30/12 1530  BP: 127/76  Pulse: 67  Temp:   Resp: 19    Complications: No apparent anesthesia complications

## 2012-11-30 NOTE — Anesthesia Preprocedure Evaluation (Addendum)
Anesthesia Evaluation  Patient identified by MRN, date of birth, ID band Patient awake    Reviewed: Allergy & Precautions, H&P , NPO status , Patient's Chart, lab work & pertinent test results  Airway Mallampati: I TM Distance: >3 FB Neck ROM: Full    Dental   Pulmonary          Cardiovascular hypertension, Pt. on medications + CAD, + Past MI and +CHF     Neuro/Psych CVA    GI/Hepatic GERD-  Medicated and Controlled,  Endo/Other  diabetes, Type 2, Insulin Dependent  Renal/GU Renal disease     Musculoskeletal   Abdominal   Peds  Hematology   Anesthesia Other Findings   Reproductive/Obstetrics                         Anesthesia Physical Anesthesia Plan  ASA: III  Anesthesia Plan: General   Post-op Pain Management:    Induction: Intravenous  Airway Management Planned: LMA  Additional Equipment:   Intra-op Plan:   Post-operative Plan: Extubation in OR  Informed Consent: I have reviewed the patients History and Physical, chart, labs and discussed the procedure including the risks, benefits and alternatives for the proposed anesthesia with the patient or authorized representative who has indicated his/her understanding and acceptance.     Plan Discussed with: CRNA and Surgeon  Anesthesia Plan Comments: (Pt went into heart failure after last surgery. Fluids KVO during procedure. Dr Milas Kocher saw her 11/1 and concurred with Dr Antoine Poche who saw her preoperatively that LV was OK and she was fluid overloaded. )       Anesthesia Quick Evaluation

## 2012-11-30 NOTE — Op Note (Signed)
Monica Lee 1950/09/27 [id number] 11/30/2012   Preoperative diagnosis: PAC needed  Postoperative diagnosis: Same  Procedure: Portacath Placement  Surgeon: Currie Paris, MD, FACS  Anesthesia: General  Clinical History and Indications: The patient is getting ready to begin chemotherapy for her cancer. She  needs a Port-A-Cath for venous access.  Description of Procedure: I have seen the patient in the holding area and confirmed the plans for the procedure as noted above. I reviewed the risks and complications again and the patient has no further questions. She wishes to proceed.   The patient was then taken to the operating room. After satisfactory general anesthesia had been obtained the upper chest and lower neck were prepped and draped as a sterile field. The timeout was done. AI attempted plantar the left subclavian vein but was unable to access the vein with any venous return after a few attempts. I then used ultrasound and try to verify the left internal jugular vein, but her anatomy is somewhat distorted because of a markedly enlarged thyroid and was unsuccessful in trying to enter the left internal jugular.  At this point I elected to make one attempt at the right subclavian vein and was successful on the first attempt. There was good backflow and the guidewire entered easily. Under fluoroscopy it appeared to go across the midline so as back up and placed in the superior vena cava. The usual subcutaneous pocket was then fashioned. The Port-A-Cath tubing was brought through the tunnel. The peel-away sheath and dilator was then advanced easily over the guidewire, the guidewire and dilator removed and the catheter threaded easily. However I was unable to aspirate any fluid although it appeared to be in good position. I tried several times to manipulate it but eventually had to remove it.  I then made another attempt entered the subclavian vein was accessed on initial attempt the  guidewire threaded easily into the superior vena cava. This time I was able to thread the dilator peel-away sheath, advanced the catheter after removing the dilator and guidewire and positioned in the distal superior vena cava. Aspirated and flushed easily. The reservoir was attached and aspirated and flushed easily. It was sutured to the fascia with 2-0 Prolene. It was flushed with concentrated aqueous heparin.  . A final check with fluoroscopy was done to make sure we had no kinks and good positioning of the tip of the catheter. Everything appeared to be okay. The catheter was aspirated, flushed with dilute heparin and then concentrated aqueous heparin.  The incision was then closed with interrupted 3-0 Vicryl, and 4-0 Monocryl subcuticular with Dermabond on the skin.  There were no operative complications. Estimated blood loss was minimal. All counts were correct. The patient tolerated the procedure well.  Currie Paris, MD, FACS 11/30/2012 1:46 PM

## 2012-11-30 NOTE — H&P (Signed)
  NAME: Monica Lee       DOB: 09-Jan-1951           DATE: 11/24/2012       ION:[id number]  CC: No chief complaint on file.   HPI: Port-A-Cath for chemotherapy. The patient recently underwent a right lumpectomy and sentinel for breast cancer. She'll be starting chemotherapy soon and needs a Port-A-Cath. She comes today to have that placed under anesthesia. I reviewed the plans and she has no further questions.  EXAM: Vital signs: BP 135/71  Pulse 66  Temp(Src) 98.3 F (36.8 C) (Oral)  Resp 20  Ht 5\' 5"  (1.651 m)  Wt 194 lb (87.998 kg)  BMI 32.28 kg/m2  SpO2 98%  General: Patient alert, oriented, NAD Heart: Regular rhythm no murmurs rubs or gallops Lungs: Clear to auscultation Breasts: Well-healed scar. She does have a hematoma in the right axilla but this is soft and improved from her last office visit   IMP: right breast cancer, needs Port-A-Cath for chemotherapy  PLAN: Port-A-Cath placement today under anesthesia  Johnathon Mittal J 11/30/2012

## 2012-11-30 NOTE — Transfer of Care (Signed)
Immediate Anesthesia Transfer of Care Note  Patient: Monica Lee  Procedure(s) Performed: Procedure(s): INSERTION PORT-A-CATH (Right)  Patient Location: PACU  Anesthesia Type:General  Level of Consciousness: awake, alert , oriented, sedated and patient cooperative  Airway & Oxygen Therapy: Patient Spontanous Breathing and Patient connected to face mask oxygen  Post-op Assessment: Report given to PACU RN and Post -op Vital signs reviewed and stable  Post vital signs: Reviewed and stable  Complications: No apparent anesthesia complications

## 2012-11-30 NOTE — Progress Notes (Signed)
Mailed after appt letter to pt. 

## 2012-12-01 ENCOUNTER — Encounter (HOSPITAL_BASED_OUTPATIENT_CLINIC_OR_DEPARTMENT_OTHER): Payer: Self-pay | Admitting: Surgery

## 2012-12-02 ENCOUNTER — Other Ambulatory Visit: Payer: Self-pay | Admitting: Cardiology

## 2012-12-03 NOTE — Telephone Encounter (Signed)
Confirmed with patient she has been taking  metoprolol 100mg  tablet bid, it was changed from 1 1/2 bid to 1 bid, script sent in.

## 2012-12-07 ENCOUNTER — Other Ambulatory Visit: Payer: Medicare Other

## 2012-12-07 ENCOUNTER — Encounter (INDEPENDENT_AMBULATORY_CARE_PROVIDER_SITE_OTHER): Payer: Self-pay

## 2012-12-14 ENCOUNTER — Other Ambulatory Visit: Payer: Medicare Other | Admitting: Lab

## 2012-12-14 ENCOUNTER — Ambulatory Visit: Payer: Medicare Other

## 2012-12-15 ENCOUNTER — Telehealth: Payer: Self-pay | Admitting: *Deleted

## 2012-12-15 ENCOUNTER — Other Ambulatory Visit (HOSPITAL_BASED_OUTPATIENT_CLINIC_OR_DEPARTMENT_OTHER): Payer: Medicare Other | Admitting: Lab

## 2012-12-15 ENCOUNTER — Encounter (INDEPENDENT_AMBULATORY_CARE_PROVIDER_SITE_OTHER): Payer: Self-pay

## 2012-12-15 ENCOUNTER — Ambulatory Visit (HOSPITAL_BASED_OUTPATIENT_CLINIC_OR_DEPARTMENT_OTHER): Payer: Medicare Other

## 2012-12-15 ENCOUNTER — Ambulatory Visit (HOSPITAL_BASED_OUTPATIENT_CLINIC_OR_DEPARTMENT_OTHER): Payer: Medicare Other | Admitting: Adult Health

## 2012-12-15 ENCOUNTER — Encounter: Payer: Self-pay | Admitting: Adult Health

## 2012-12-15 ENCOUNTER — Telehealth: Payer: Self-pay | Admitting: Oncology

## 2012-12-15 VITALS — BP 110/61 | HR 72 | Temp 98.3°F | Resp 18 | Ht 65.0 in | Wt 203.4 lb

## 2012-12-15 DIAGNOSIS — C50519 Malignant neoplasm of lower-outer quadrant of unspecified female breast: Secondary | ICD-10-CM

## 2012-12-15 DIAGNOSIS — C50511 Malignant neoplasm of lower-outer quadrant of right female breast: Secondary | ICD-10-CM

## 2012-12-15 DIAGNOSIS — Z5111 Encounter for antineoplastic chemotherapy: Secondary | ICD-10-CM | POA: Diagnosis not present

## 2012-12-15 DIAGNOSIS — E119 Type 2 diabetes mellitus without complications: Secondary | ICD-10-CM

## 2012-12-15 DIAGNOSIS — Z940 Kidney transplant status: Secondary | ICD-10-CM | POA: Diagnosis not present

## 2012-12-15 DIAGNOSIS — Z171 Estrogen receptor negative status [ER-]: Secondary | ICD-10-CM

## 2012-12-15 DIAGNOSIS — I509 Heart failure, unspecified: Secondary | ICD-10-CM

## 2012-12-15 LAB — COMPREHENSIVE METABOLIC PANEL (CC13)
ALT: 17 U/L (ref 0–55)
Albumin: 3.8 g/dL (ref 3.5–5.0)
Anion Gap: 12 mEq/L — ABNORMAL HIGH (ref 3–11)
BUN: 28.3 mg/dL — ABNORMAL HIGH (ref 7.0–26.0)
CO2: 26 mEq/L (ref 22–29)
Calcium: 9 mg/dL (ref 8.4–10.4)
Chloride: 104 mEq/L (ref 98–109)
Creatinine: 1.5 mg/dL — ABNORMAL HIGH (ref 0.6–1.1)
Glucose: 158 mg/dl — ABNORMAL HIGH (ref 70–140)
Total Bilirubin: 0.28 mg/dL (ref 0.20–1.20)

## 2012-12-15 LAB — CBC WITH DIFFERENTIAL/PLATELET
BASO%: 1 % (ref 0.0–2.0)
Basophils Absolute: 0.1 10*3/uL (ref 0.0–0.1)
Eosinophils Absolute: 0.1 10*3/uL (ref 0.0–0.5)
HCT: 35.7 % (ref 34.8–46.6)
HGB: 11.4 g/dL — ABNORMAL LOW (ref 11.6–15.9)
LYMPH%: 16.6 % (ref 14.0–49.7)
MONO#: 0.5 10*3/uL (ref 0.1–0.9)
NEUT#: 4.3 10*3/uL (ref 1.5–6.5)
NEUT%: 72.8 % (ref 38.4–76.8)
WBC: 5.9 10*3/uL (ref 3.9–10.3)
lymph#: 1 10*3/uL (ref 0.9–3.3)

## 2012-12-15 MED ORDER — DEXAMETHASONE SODIUM PHOSPHATE 10 MG/ML IJ SOLN
10.0000 mg | Freq: Once | INTRAMUSCULAR | Status: AC
  Administered 2012-12-15: 10 mg via INTRAVENOUS

## 2012-12-15 MED ORDER — SODIUM CHLORIDE 0.9 % IV SOLN
Freq: Once | INTRAVENOUS | Status: AC
  Administered 2012-12-15: 13:00:00 via INTRAVENOUS

## 2012-12-15 MED ORDER — FLUOROURACIL CHEMO INJECTION 2.5 GM/50ML
600.0000 mg/m2 | Freq: Once | INTRAVENOUS | Status: AC
  Administered 2012-12-15: 1200 mg via INTRAVENOUS
  Filled 2012-12-15: qty 24

## 2012-12-15 MED ORDER — METHOTREXATE SODIUM CHEMO INJECTION 25 MG/ML
75.0000 mg | Freq: Once | INTRAMUSCULAR | Status: AC
  Administered 2012-12-15: 75 mg via INTRAVENOUS
  Filled 2012-12-15: qty 3

## 2012-12-15 MED ORDER — ONDANSETRON 8 MG/50ML IVPB (CHCC)
8.0000 mg | Freq: Once | INTRAVENOUS | Status: AC
  Administered 2012-12-15: 8 mg via INTRAVENOUS

## 2012-12-15 MED ORDER — ONDANSETRON 8 MG/NS 50 ML IVPB
INTRAVENOUS | Status: AC
  Filled 2012-12-15: qty 8

## 2012-12-15 MED ORDER — SODIUM CHLORIDE 0.9 % IJ SOLN
10.0000 mL | INTRAMUSCULAR | Status: DC | PRN
  Administered 2012-12-15: 10 mL
  Filled 2012-12-15: qty 10

## 2012-12-15 MED ORDER — DEXAMETHASONE SODIUM PHOSPHATE 20 MG/5ML IJ SOLN
INTRAMUSCULAR | Status: AC
  Filled 2012-12-15: qty 5

## 2012-12-15 MED ORDER — DEXAMETHASONE SODIUM PHOSPHATE 10 MG/ML IJ SOLN
INTRAMUSCULAR | Status: AC
  Filled 2012-12-15: qty 1

## 2012-12-15 MED ORDER — SODIUM CHLORIDE 0.9 % IV SOLN
600.0000 mg/m2 | Freq: Once | INTRAVENOUS | Status: AC
  Administered 2012-12-15: 1220 mg via INTRAVENOUS
  Filled 2012-12-15: qty 61

## 2012-12-15 MED ORDER — HEPARIN SOD (PORK) LOCK FLUSH 100 UNIT/ML IV SOLN
500.0000 [IU] | Freq: Once | INTRAVENOUS | Status: AC | PRN
  Administered 2012-12-15: 500 [IU]
  Filled 2012-12-15: qty 5

## 2012-12-15 NOTE — Telephone Encounter (Signed)
Per staff message and POF I have scheduled appts.  JMW  

## 2012-12-15 NOTE — Progress Notes (Signed)
Monica Lee [id number] 22-Feb-1950 62 y.o. 12/16/2012 9:55 AM  CC  Monica Barre, MD [address] [place] [address] Dr. Chipper Herb Dr. 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   STAGE:   Breast cancer of lower-outer quadrant of right female breast   Primary site: Breast (Right)   Staging method: AJCC 7th Edition   Clinical: Stage IIA (T2, N0, cM0)   Summary: Stage IIA (T2, N0, cM0)  REFERRING PHYSICIAN: Dr. Cyndia Bent  Prior therapy:  Monica Lee is a 62 y.o. female.   #1 Who felt a mass along the inferior aspect of the right breast at about the 7:00 position. On 10/08/2012 she had a mammogram performed that showed a irregular mass with internal pleomorphic calcifications. Ultrasound showed a suspicious 2.1 x 1.3 x 1.6 cm mass in the inframammary fold. A needle core biopsy performed on the same day was diagnostic for invasive ductal carcinoma with associated DCIS. The tumor was ER negative PR negative and HER-2/neu negative. The proliferation marker Ki-67 was elevated at 50% and the tumor was grade 2.   #2 patient is status post right lumpectomy with sentinel lymph node biopsy. Her final pathology revealed 2.2 cm invasive ductal carcinoma that was grade 3 that was triple negative with a proliferation marker Ki-67 at 56%.  #3 adjuvant chemotherapy beginning December 2014 with CMF every 21 days for total of 6 cycles   Current therapy: Adjuvant CMF  Interval history: Patient is here for evaluation prior to CMF chemotherapy.  She is doing well today.  She denies fevers, chills, nausea, vomiting, constipation, diarrhea, numbness, or any questions or concerns.    Past Medical History: Past Medical History  Diagnosis Date  . Diabetes mellitus   . Acid reflux   . S/P kidney transplant   . GERD (gastroesophageal reflux disease)   . Hyperlipidemia   . DJD  (degenerative joint disease)   . TIA (transient ischemic attack)   . Hx of colonic polyps   . PUD (peptic ulcer disease)   . Diabetic retinopathy   . Hemorrhoids   . Generalized headaches   . Hypertension   . Asthma   . MI (myocardial infarction) 2011  . Kidney Monica Lee   . Mental disorder   . Stroke 2008    no deficits  . Anemia     Hx-not current  . Breast cancer   . Blind left eye   . Pneumonia   . Chronic diastolic CHF (congestive heart failure)     a. Cardiac MRI (10/2012): Moderate to severe LVH, EF 55%, chordal SAM but no LVOT gradient, mod circumferential effusion, mild LAE.  b.  Echocardiogram (11/06/12): Severe LVH, EF 60-65%, grade 1 diastolic dysfunction, moderate effusion.  Marland Kitchen Hx of cardiovascular stress test     a. Lexiscan Myoview (11/05/12): High-risk scan; fixed defect affecting the entire inferolateral and anterolateral wall; mid/apical anterior and mid/apical inferior moderate ischemia, EF 28%.  (felt to be false + as echo and cMRI with normal EF and normal WM)  . Hx of cardiac cath     a. reportedly no CAD on cardiac cath in [place]  . Wears glasses     Past Surgical History: Past Surgical History  Procedure Laterality Date  . Transplantation renal  05/2006  . Cardiac catheterization  2011  . Abdominal hysterectomy    . Cataract extraction  bil  . Intestinal  perforation surgery  01/2009  . Breast biopsy  2010  . Gallstones removed    . Cholecystectomy  2008  . Colon surgery  2013    polyps removed  . Hemorrhoid surgery  01/23/2012    Procedure: HEMORRHOIDECTOMY PROLAPSED;  Surgeon: Romie Levee, MD;  Location: [facility];  Service: General;  Laterality: N/A;  . Eye surgery Left 1997  . Foot surgery Right 2013  . Breast lumpectomy with sentinel lymph node biopsy Right 11/09/2012    Procedure: RIGHT BREAST LUMPECTOMY WITH SENTINEL LYMPH NODE REMOVAL;  Surgeon: Currie Paris, MD;  Location: [facility];  Service: General;  Laterality: Right;  .  Portacath placement Right 11/30/2012    Procedure: INSERTION PORT-A-CATH;  Surgeon: Currie Paris, MD;  Location: [facility];  Service: General;  Laterality: Right;    Family History: Family History  Problem Relation Age of Onset  . Kidney disease Neg Hx   . Arthritis Other     parent  . Heart disease Other     parent  . Hypertension Other     parent  . Diabetes Other     parent  . Diabetes Other     grandparent  . Thyroid disease Other     several  . Heart disease Father     Social History History  Substance Use Topics  . Smoking status: Never Smoker   . Smokeless tobacco: Never Used  . Alcohol Use: No    Allergies: Allergies  Allergen Reactions  . Cephalexin Swelling    Swelling to face, chills  . Propoxyphene-Acetaminophen Hives and Itching    Current Medications: Current Outpatient Prescriptions  Medication Sig Dispense Refill  . albuterol (ACCUNEB) 0.63 MG/3ML nebulizer solution Take 3 mLs (0.63 mg total) by nebulization every 6 (six) hours as needed for wheezing.  75 mL  12  . albuterol (PROVENTIL HFA;VENTOLIN HFA) 108 (90 BASE) MCG/ACT inhaler Inhale 2 puffs into the lungs every 6 (six) hours as needed for wheezing.      Marland Kitchen amLODipine (NORVASC) 10 MG tablet Take 1 tablet (10 mg total) by mouth daily.  90 tablet  3  . aspirin EC 325 MG tablet Take 325 mg by mouth daily.        Marland Kitchen azaTHIOprine (IMURAN) 50 MG tablet Take 50 mg by mouth 2 (two) times daily.        . brimonidine (ALPHAGAN) 0.2 % ophthalmic solution Place 1 drop into the right eye 2 (two) times daily.      . cinacalcet (SENSIPAR) 30 MG tablet Take 1 tablet (30 mg total) by mouth daily.  30 tablet  11  . dexamethasone (DECADRON) 4 MG tablet Take 2 tablets (8 mg total) by mouth 2 (two) times daily with a meal. Take daily starting the day after chemotherapy for 2 days. Take with food.  30 tablet  1  . docusate sodium (COLACE) 100 MG capsule Take 1 capsule (100 mg total) by mouth 2  (two) times daily.  60 capsule  0  . esomeprazole (NEXIUM) 40 MG capsule Take 40 mg by mouth daily before breakfast.      . furosemide (LASIX) 80 MG tablet Take 160 mg by mouth 2 (two) times daily.       . insulin aspart (NOVOLOG) 100 UNIT/ML injection Inject 13-16 Units into the skin 3 (three) times daily. Injects 13 units daily at breakfast, 13 units daily at lunch, and 16 units daily at supper.      Marland Kitchen  insulin glargine (LANTUS) 100 UNIT/ML injection Inject 55 Units into the skin at bedtime.      . isosorbide mononitrate (IMDUR) 60 MG 24 hr tablet Take 60 mg by mouth daily.      Marland Kitchen lidocaine-prilocaine (EMLA) cream Apply topically as needed.  30 g  6  . Magnesium Oxide 250 MG TABS Take 250 mg by mouth daily.       . metoprolol (LOPRESSOR) 100 MG tablet Take 1 tablet (100 mg total) by mouth 2 (two) times daily.  180 tablet  2  . minoxidil (LONITEN) 2.5 MG tablet Take 2.5 mg by mouth 2 (two) times daily.      . Multiple Vitamin (MULTIVITAMIN) capsule Take 1 capsule by mouth daily.        . nitroGLYCERIN (NITROSTAT) 0.4 MG SL tablet Place 1 tablet (0.4 mg total) under the tongue every 5 (five) minutes as needed for chest pain.  25 tablet  11  . polyethylene glycol (MIRALAX / GLYCOLAX) packet Take 17 g by mouth daily.  14 each  11  . potassium chloride (KLOR-CON 10) 10 MEQ tablet Take 1 tablet (10 mEq total) by mouth 2 (two) times daily.  90 tablet  3  . LORazepam (ATIVAN) 0.5 MG tablet Take 1 tablet (0.5 mg total) by mouth every 6 (six) hours as needed (Nausea or vomiting).  30 tablet  0  . ondansetron (ZOFRAN) 8 MG tablet Take 1 tablet (8 mg total) by mouth 2 (two) times daily. Take two times a day starting the day after chemo for 2 days. Then take two times a day as needed for nausea or vomiting.  30 tablet  1  . pravastatin (PRAVACHOL) 20 MG tablet Take 10 mg by mouth at bedtime.        . prednisoLONE acetate (PRED FORTE) 1 % ophthalmic suspension Place 1 drop into the left eye 3 (three) times  daily.      . predniSONE (DELTASONE) 10 MG tablet Take 10 mg by mouth daily.      . prochlorperazine (COMPAZINE) 10 MG tablet Take 1 tablet (10 mg total) by mouth every 6 (six) hours as needed (Nausea or vomiting).  30 tablet  1  . tacrolimus (PROGRAF) 1 MG capsule Take 9 mg by mouth 2 (two) times daily.       . timolol (TIMOPTIC) 0.25 % ophthalmic solution Place 1 drop into the right eye 2 (two) times daily.      . traMADol (ULTRAM) 50 MG tablet Take 50 mg by mouth every 6 (six) hours as needed for pain.       No current facility-administered medications for this visit.      REVIEW OF SYSTEMS: A 10 point review of systems was conducted and is otherwise negative except for what is noted above.     PHYSICAL EXAMINATION: Blood pressure 110/61, pulse 72, temperature 98.3 F (36.8 C), temperature source Oral, resp. rate 18, height 5\' 5"  (1.651 m), weight 203 lb 6 oz (92.25 kg). General: Patient is a well appearing female in no acute distress HEENT: PERRLA, sclerae anicteric no conjunctival pallor, MMM Neck: supple, no palpable adenopathy Lungs: clear to auscultation bilaterally, no wheezes, rhonchi, or rales Cardiovascular: regular rate rhythm, S1, S2, no murmurs, rubs or gallops Abdomen: Soft, non-tender, non-distended, normoactive bowel sounds, no HSM Extremities: warm and well perfused, no clubbing, cyanosis, or edema Skin: No rashes or lesions Neuro: Non-focal ECOG PERFORMANCE STATUS: 1 - Symptomatic but completely ambulatory   STUDIES/RESULTS: US Breast  Right  10/08/2012   CLINICAL DATA:  Palpable area of concern in the 7 o'clock region of the right breast.  EXAM: DIGITAL DIAGNOSTIC BILATERALMAMMOGRAM WITH CAD  ULTRASOUND RIGHT BREAST  COMPARISON:  WITH PRIORS  ACR Breast Density Category c: The breast tissue is heterogeneously dense.  FINDINGS: In the deep aspect of the lower outer quadrant of the right breast is an irregular mass with internal pleomorphic calcifications.  No mass,  suspicious microcalcification, or distortion is identified in the left breast to suggest malignancy.  Mammographic images were processed with CAD.  On physical exam there is a firm approximately 2.5 cm mass in the 7 o'clock position right breast approximately 9 cm from the nipple near the inframammary fold. No additional mass is palpated in the right breast. No axillary lymphadenopathy is palpated on the right.  Ultrasound is performed, showing an irregular heterogeneous and hypoechoic mass measuring approximately 2.1 x 1.3 x 1.6 cm. There is internal vascular flow. Small echogenicities within the mass are consistent with the suspicious calcifications seen on mammography.  Ultrasound the right axilla demonstrates a normal axillary lymph node. No lymphadenopathy is detected.  IMPRESSION: Suspicious 2.1 x 1.3 x 1.6 cm palpable mass 10 o'clock position right breast near the inframammary fold. Findings are suggestive of malignancy and ultrasound-guided biopsy is recommended and will be performed today and dictated separately.  RECOMMENDATION: Ultrasound-guided biopsy of a right breast mass.  I have discussed the findings and recommendations with the patient. Results were also provided in writing at the conclusion of the visit. If applicable, a reminder letter will be sent to the patient regarding the next appointment.  BI-RADS CATEGORY  5: Highly suggestive of malignancy - appropriate action should be taken.   Electronically Signed   By: Britta Mccreedy   On: 10/08/2012 16:45   Mm Digital Diagnostic Bilat  10/08/2012   CLINICAL DATA:  Palpable area of concern in the 7 o'clock region of the right breast.  EXAM: DIGITAL DIAGNOSTIC BILATERALMAMMOGRAM WITH CAD  ULTRASOUND RIGHT BREAST  COMPARISON:  WITH PRIORS  ACR Breast Density Category c: The breast tissue is heterogeneously dense.  FINDINGS: In the deep aspect of the lower outer quadrant of the right breast is an irregular mass with internal pleomorphic calcifications.   No mass, suspicious microcalcification, or distortion is identified in the left breast to suggest malignancy.  Mammographic images were processed with CAD.  On physical exam there is a firm approximately 2.5 cm mass in the 7 o'clock position right breast approximately 9 cm from the nipple near the inframammary fold. No additional mass is palpated in the right breast. No axillary lymphadenopathy is palpated on the right.  Ultrasound is performed, showing an irregular heterogeneous and hypoechoic mass measuring approximately 2.1 x 1.3 x 1.6 cm. There is internal vascular flow. Small echogenicities within the mass are consistent with the suspicious calcifications seen on mammography.  Ultrasound the right axilla demonstrates a normal axillary lymph node. No lymphadenopathy is detected.  IMPRESSION: Suspicious 2.1 x 1.3 x 1.6 cm palpable mass 10 o'clock position right breast near the inframammary fold. Findings are suggestive of malignancy and ultrasound-guided biopsy is recommended and will be performed today and dictated separately.  RECOMMENDATION: Ultrasound-guided biopsy of a right breast mass.  I have discussed the findings and recommendations with the patient. Results were also provided in writing at the conclusion of the visit. If applicable, a reminder letter will be sent to the patient regarding the next appointment.  BI-RADS CATEGORY  5:  Highly suggestive of malignancy - appropriate action should be taken.   Electronically Signed   By: Britta Mccreedy   On: 10/08/2012 16:45   Mm Digital Diagnostic Unilat R  10/08/2012   CLINICAL DATA:  Ultrasound-guided biopsy of right breast mass was performed.  EXAM: DIGITAL DIAGNOSTIC UNILATERAL RIGHT MAMMOGRAM  COMPARISON:  Previous exams.  FINDINGS: Films are performed following ultrasound guided biopsy of probable some suspicious right breast mass at 7 o'clock position near the inframammary fold. A dumbbell shaped biopsy clip is in satisfactory position within the  mass.  IMPRESSION: Satisfactory position of biopsy clip in the right breast mass.  Final Assessment: Post Procedure Mammograms for Marker Placement   Electronically Signed   By: Britta Mccreedy   On: 10/08/2012 16:48   Mr Card Morphology W/o Cm  10/29/2012   CLINICAL DATA:  Cardiomyopathy:  Assess EF  EXAM: CARDIAC MRI  TECHNIQUE: The patient was scanned on a 1.5 Tesla GE magnet. A dedicated cardiac coil was used. Functional imaging was done using Fiesta sequences. 2,3, and 4 chamber views were done to assess for RWMA's. Modified Simpson's rule using a short axis stack was used to calculate an ejection fraction on a dedicated work [employer]. No multihance was used due to the patient previous renal transplant.  CONTRAST:  None  FINDINGS: There was moderate to severel LVH with septal thickness 15mm. There was a moderate circumferential pericardial effusion. There was mild LAE Right sided cardiac chambers were normal with no diastolic flattening. The aortic valve had some turbulence to flow in systole There was no significant MR or AR. The LV cavity size was small with prominent papillary muscles and a narrow LVOT. There was chordal SAM with no evidence of sub valvular obstruction.  There were no discrete RWMA;s Quantitative EF was 55%(EDV 130 cc ESV 59 cc and SV 71cc) Global longitudinal strain was normal at 1 21.5%  IMPRESSION: 1) Moderate to severe LVH with normal EF 55% Small LV cavity size with chordal SAM but o LVOT gradient or true HOCM  2) Normal longitudinal strain -21.5%  3) Moderate circumferential pericardial effusion  4) Normal RV/RA  5) Mild LAE  No gadolinium given so no infarct imaging done due to patients history of renal transplant  Charlton Haws   Electronically Signed   By: Charlton Haws M.D.   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 This is concordant. The patient was notified of results the afternoon of 10/09/2012 by Dr. Jean Rosenthal. She has no complaints regarding her biopsy site. She is scheduled for multidisciplinary clinic on 10/14/2012. Due to the patient's renal transplant, no breast MRI is scheduled.   Electronically Signed   By: Jerene Dilling M.D.   On: 10/09/2012 16:46   10/09/2012   CLINICAL DATA:  Suspicious palpable mass 10 o'clock position right breast.  EXAM: US BREAST BX W LOC DEV 1ST LESION IMG BX SPEC US GUIDE*R*  COMPARISON:  Previous exams.  PROCEDURE: I met with the patient and we discussed the procedure of ultrasound-guided biopsy, including benefits and alternatives. We discussed the high likelihood of a successful procedure. We discussed the risks of the procedure including infection, bleeding, tissue injury, clip migration, and inadequate sampling. Informed written consent was given.  Using sterile technique and 2% Lidocaine as local  anesthetic, under direct ultrasound visualization, a 12 gauge vacuum-assisteddevice was used to perform biopsy of an approximate 2.1 cm solid mass at the 7 o'clock position 9 cm from the nipple using a lateral to medial approach. At the conclusion of the procedure, a dumbbell-shaped tissue marker clip was deployed into the biopsy cavity. Follow-up 2-view mammogram was performed and dictated separately.  The usual time-out protocol was performed immediately prior to the procedure.  IMPRESSION: Ultrasound-guided biopsy of right breast mass. No apparent complications.  Electronically Signed: By: Britta Mccreedy On: 10/08/2012 16:47     LABS:    Chemistry      Component Value Date/Time   NA 143 12/15/2012 1116   NA 139 11/14/2012 1705   K 4.3 12/15/2012 1116   K 4.1 11/14/2012 1705   CL 99 11/14/2012 1705   CO2 26 12/15/2012 1116   CO2 28 11/14/2012 1705   BUN 28.3* 12/15/2012 1116   BUN 29* 11/14/2012 1705   CREATININE 1.5* 12/15/2012 1116    CREATININE 1.68* 11/14/2012 1705      Component Value Date/Time   CALCIUM 9.0 12/15/2012 1116   CALCIUM 8.4 11/14/2012 1705   ALKPHOS 52 12/15/2012 1116   ALKPHOS 51 11/11/2012 0610   AST 35* 12/15/2012 1116   AST 31 11/11/2012 0610   ALT 17 12/15/2012 1116   ALT 28 11/11/2012 0610   BILITOT 0.28 12/15/2012 1116   BILITOT 0.2* 11/11/2012 0610      Lab Results  Component Value Date   WBC 5.9 12/15/2012   HGB 11.4* 12/15/2012   HCT 35.7 12/15/2012   MCV 91.7 12/15/2012   PLT 210 12/15/2012   PATHOLOGY: SpADDITIONAL INFORMATION: 1. CHROMOGENIC IN-SITU HYBRIDIZATION Results: HER-2/NEU BY CISH - NO AMPLIFICATION OF HER-2 DETECTED. RESULT RATIO OF HER2: CEP 17 SIGNALS 1.38 AVERAGE HER2 COPY NUMBER PER CELL 2.2 REFERENCE RANGE NEGATIVE HER2/Chr17 Ratio <2.0 and Average HER2 copy number <4.0 EQUIVOCAL HER2/Chr17 Ratio <2.0 and Average HER2 copy number 4.0 and <6.0 POSITIVE HER2/Chr17 Ratio >=2.0 and/or Average HER2 copy number >=6.0 Monica Miyamoto MD Pathologist, Electronic Signature ( Signed 11/17/2012) 1. PROGNOSTIC INDICATORS - ACIS Results: IMMUNOHISTOCHEMICAL AND MORPHOMETRIC ANALYSIS BY THE AUTOMATED CELLULAR IMAGING SYSTEM (ACIS) Estrogen Receptor: 0%, NEGATIVE COMMENT: The negative hormone receptor study(ies) in this case has an internal positive control. REFERENCE RANGE ESTROGEN RECEPTOR NEGATIVE <1% POSITIVE =>1% PROGESTERONE RECEPTOR 1 of 4 FINAL for Monica Lee, Monica Lee ([id number]) ADDITIONAL INFORMATION:(continued) NEGATIVE <1% POSITIVE =>1% All controls stained appropriately Pecola Leisure MD Pathologist, Electronic Signature ( Signed 11/13/2012) FINAL DIAGNOSIS Diagnosis 1. 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 - MICROCALCIFICATIONS WITHIN CARCINOMA. - DUCTAL CARCINOMA IN SITU, HIGH GRADE. - RESECTION MARGINS ARE NEGATIVE FOR CARCINOMA. - SEE ONCOLOGY TABLE. 2. Lymph node, sentinel, biopsy, Right axillary  #1 - ONE BENIGN LYMPH NODE (0/1). 3. Lymph node, sentinel, biopsy, Right axillary #2 - ONE BENIGN LYMPH NODE (0/1). 4. Lymph node, sentinel, biopsy, Right axillary #3 - ONE BENIGN LYMPH NODE (0/1). Microscopic Comment 1. BREAST, INVASIVE TUMOR, WITH LYMPH NODE SAMPLING Specimen, including laterality and lymph node sampling (sentinel, non-sentinel): Right breast lumpectomy and right axillary sentinel lymph nodes. Procedure: Right breast lumpectomy. Histologic type: Invasive ductal carcinoma. Grade: III Tubule formation: 3 Nuclear pleomorphism: 3 Mitotic: 2 Tumor size (gross measurement): 2.2 cm Margins: Negative. Invasive, distance to closest margin: 0.3 cm In-situ, distance to closest margin: 0.3 cm If margin positive, focally or broadly: N/A. Lymphovascular invasion: Absent. Ductal carcinoma in situ:  Present. Grade: High grade. Extensive intraductal component: Absent. Lobular neoplasia: Absent. Tumor focality: Unifocal. Treatment effect: N/A. Extent of tumor: Confined to breast. Lymph nodes: 2 of 4 FINAL for Monica Lee, Monica Lee ([id number]) Microscopic Comment(continued) Examined: 3 Sentinel 0 Non-sentinel 3 Total Lymph nodes with metastasis: 0 Isolated tumor cells (< 0.2 mm): 0 Micrometastasis: (> 0.2 mm and < 2.0 mm): 0 Macrometastasis: (> 2.0 mm): 0 Extracapsular extension: N/A Breast prognostic profile: [phone]. ER and Her 2 neu will be repeated on the current specimen and reported separately. Estrogen receptor: Negative. Progesterone receptor: Negative. Her 2 neu: Not amplified. Ki-67: 56% Non-neoplastic breast: Fibrocystic change, biopsy site change. TNM: pT2 pN0(SN) Valinda Hoar MD Pathologist, Electronic Signature (Case signed 11/11/2012) cimen Gross and Clinical Information ASSESSMENT    62 year old female with  #1 new diagnosis of stage II invasive ductal carcinoma that is ER negative PR negative HER-2/neu negative with a proliferation marker  Ki-67 of 56%. Patient is status post lumpectomy of the right breast with sentinel lymph node biopsy. The final pathology did reveal a 2.2 cm grade 3 disease that was triple negative. Sentinel nodes were negative for metastatic disease.  #2 patient and I discussed her pathology. I have recommended proceeding with adjuvant chemotherapy. Due to her multiple medical problems including history of renal transplant she will receive CMF every 21 days for a total of 6 cycles. She underwent port placement by Dr. Jamey Ripa on 11/30/12.  She is currently cycle 1 day 1 of treatment.   PLAN: #1 Ms. Zacher is doing well today.  She will proceed with chemotherapy today.  I reviewed her regimen, possible adverse effects, and encouraged her to call with any questions whatsoever.    #2 She will return in one week for labs and evaluation.    #3 We also adjusted her schedule as she would like to receive chemotherapy the week after christmas, rather than prior to.   All questions were answered. The patient knows to call the clinic with any problems, questions or concerns. We can certainly see the patient much sooner if necessary.  I spent 25 minutes counseling the patient face to face.  The total time spent in the appointment was 30 minutes.   Monica Level, NP Medical Oncology [facility] [phone]

## 2012-12-15 NOTE — Patient Instructions (Signed)
Doing well.  Proceed with chemotherapy.  Please call us if you have any questions or concerns.    

## 2012-12-15 NOTE — Patient Instructions (Addendum)
[facility] Discharge Instructions for Patients Receiving Chemotherapy  Today you received the following chemotherapy agents 5 Fu/Cytoxan/Methotrexate  To help prevent nausea and vomiting after your treatment, we encourage you to take your nausea medication as prescribed.   If you develop nausea and vomiting that is not controlled by your nausea medication, call the clinic.   BELOW ARE SYMPTOMS THAT SHOULD BE REPORTED IMMEDIATELY:  *FEVER GREATER THAN 100.5 F  *CHILLS WITH OR WITHOUT FEVER  NAUSEA AND VOMITING THAT IS NOT CONTROLLED WITH YOUR NAUSEA MEDICATION  *UNUSUAL SHORTNESS OF BREATH  *UNUSUAL BRUISING OR BLEEDING  TENDERNESS IN MOUTH AND THROAT WITH OR WITHOUT PRESENCE OF ULCERS  *URINARY PROBLEMS  *BOWEL PROBLEMS  UNUSUAL RASH Items with * indicate a potential emergency and should be followed up as soon as possible.  Feel free to call the clinic you have any questions or concerns. The clinic phone number is [phone].     Cyclophosphamide injection (Cytoxan) What is this medicine? CYCLOPHOSPHAMIDE (sye kloe FOSS fa mide) is a chemotherapy drug. It slows the growth of cancer cells. This medicine is used to treat many types of cancer like lymphoma, myeloma, leukemia, breast cancer, and ovarian cancer, to name a few. This medicine may be used for other purposes; ask your health care provider or pharmacist if you have questions. COMMON BRAND NAME(S): Cytoxan, Neosar What should I tell my health care provider before I take this medicine? They need to know if you have any of these conditions: -blood disorders -history of other chemotherapy -infection -kidney disease -liver disease -recent or ongoing radiation therapy -tumors in the bone marrow -an unusual or allergic reaction to cyclophosphamide, other chemotherapy, other medicines, foods, dyes, or preservatives -pregnant or trying to get pregnant -breast-feeding How should I use this  medicine? This drug is usually given as an injection into a vein or muscle or by infusion into a vein. It is administered in a hospital or clinic by a specially trained health care professional. Talk to your pediatrician regarding the use of this medicine in children. Special care may be needed. Overdosage: If you think you have taken too much of this medicine contact a poison control center or emergency room at once. NOTE: This medicine is only for you. Do not share this medicine with others. What if I miss a dose? It is important not to miss your dose. Call your doctor or health care professional if you are unable to keep an appointment. What may interact with this medicine? This medicine may interact with the following medications: -amiodarone -amphotericin B -azathioprine -certain antiviral medicines for HIV or AIDS such as protease inhibitors (e.g., indinavir, ritonavir) and zidovudine -certain blood pressure medications such as benazepril, captopril, enalapril, fosinopril, lisinopril, moexipril, monopril, perindopril, quinapril, ramipril, trandolapril -certain cancer medications such as anthracyclines (e.g., daunorubicin, doxorubicin), busulfan, cytarabine, paclitaxel, pentostatin, tamoxifen, trastuzumab -certain diuretics such as chlorothiazide, chlorthalidone, hydrochlorothiazide, indapamide, metolazone -certain medicines that treat or prevent blood clots like warfarin -certain muscle relaxants such as succinylcholine -cyclosporine -etanercept -indomethacin -medicines to increase blood counts like filgrastim, pegfilgrastim, sargramostim -medicines used as general anesthesia -metronidazole -natalizumab This list may not describe all possible interactions. Give your health care provider a list of all the medicines, herbs, non-prescription drugs, or dietary supplements you use. Also tell them if you smoke, drink alcohol, or use illegal drugs. Some items may interact with your  medicine. What should I watch for while using this medicine? Visit your doctor for checks on your progress. This  drug may make you feel generally unwell. This is not uncommon, as chemotherapy can affect healthy cells as well as cancer cells. Report any side effects. Continue your course of treatment even though you feel ill unless your doctor tells you to stop. Drink water or other fluids as directed. Urinate often, even at night. In some cases, you may be given additional medicines to help with side effects. Follow all directions for their use. Call your doctor or health care professional for advice if you get a fever, chills or sore throat, or other symptoms of a cold or flu. Do not treat yourself. This drug decreases your body's ability to fight infections. Try to avoid being around people who are sick. This medicine may increase your risk to bruise or bleed. Call your doctor or health care professional if you notice any unusual bleeding. Be careful brushing and flossing your teeth or using a toothpick because you may get an infection or bleed more easily. If you have any dental work done, tell your dentist you are receiving this medicine. You may get drowsy or dizzy. Do not drive, use machinery, or do anything that needs mental alertness until you know how this medicine affects you. Do not become pregnant while taking this medicine or for 1 year after stopping it. Women should inform their doctor if they wish to become pregnant or think they might be pregnant. Men should not father a child while taking this medicine and for 4 months after stopping it. There is a potential for serious side effects to an unborn child. Talk to your health care professional or pharmacist for more information. Do not breast-feed an infant while taking this medicine. This medicine may interfere with the ability to have a child. This medicine has caused ovarian failure in some women. This medicine has caused reduced sperm  counts in some men. You should talk with your doctor or health care professional if you are concerned about your fertility. If you are going to have surgery, tell your doctor or health care professional that you have taken this medicine. What side effects may I notice from receiving this medicine? Side effects that you should report to your doctor or health care professional as soon as possible: -allergic reactions like skin rash, itching or hives, swelling of the face, lips, or tongue -low blood counts - this medicine may decrease the number of white blood cells, red blood cells and platelets. You may be at increased risk for infections and bleeding. -signs of infection - fever or chills, cough, sore throat, pain or difficulty passing urine -signs of decreased platelets or bleeding - bruising, pinpoint red spots on the skin, black, tarry stools, blood in the urine -signs of decreased red blood cells - unusually weak or tired, fainting spells, lightheadedness -breathing problems -dark urine -dizziness -palpitations -swelling of the ankles, feet, hands -trouble passing urine or change in the amount of urine -weight gain -yellowing of the eyes or skin Side effects that usually do not require medical attention (report to your doctor or health care professional if they continue or are bothersome): -changes in nail or skin color -hair loss -missed menstrual periods -mouth sores -nausea, vomiting This list may not describe all possible side effects. Call your doctor for medical advice about side effects. You may report side effects to FDA at 1-800-FDA-1088. Where should I keep my medicine? This drug is given in a hospital or clinic and will not be stored at home. NOTE: This sheet is a  summary. It may not cover all possible information. If you have questions about this medicine, talk to your doctor, pharmacist, or health care provider.  2014, Elsevier/Gold Standard. (2011-11-15  16:22:58) Methotrexate injection What is this medicine? METHOTREXATE (METH oh TREX ate) is a chemotherapy drug. This medicine affects cells that are rapidly growing, such as cancer cells and cells in your mouth and stomach. It is used to treat many cancers and other medical conditions. It is used for leukemias, lymphomas, breast cancer, lung cancer, head and neck cancers, and other cancers. This medicine also works on the immune system and is commonly used to treat psoriasis and rheumatoid arthritis. This medicine may be used for other purposes; ask your health care provider or pharmacist if you have questions. What should I tell my health care provider before I take this medicine? They need to know if you have any of these conditions: -if you frequently drink alcohol containing drinks -infection (especially a virus infection such as chickenpox, cold sores, or herpes) -immune system problems -kidney disease -liver disease -low blood counts, like platelets, red bloods, or white blood cells -lung disease -recent or ongoing radiation therapy -an unusual or allergic reaction to methotrexate, benzyl alcohol, other medicines, foods, dyes, or preservatives -pregnant or trying to get pregnant -breast-feeding How should I use this medicine? This drug is given as an injection into a muscle or into a vein. It may also be given into the spinal fluid. It is administered in a hospital or clinic by a specially trained health care professional. Talk to your pediatrician regarding the use of this medicine in children. While this drug may be prescribed for selected conditions, precautions do apply. Overdosage: If you think you have taken too much of this medicine contact a poison control center or emergency room at once. NOTE: This medicine is only for you. Do not share this medicine with others. What if I miss a dose? It is important not to miss your dose. Call your doctor or health care professional if you  are unable to keep an appointment. What may interact with this medicine? -antibiotics and other medicines for infections -aspirin and aspirin-like medicines including bismuth subsalicylate (Pepto-Bismol) -cisplatin -dapsone -folic acid in supplements or vitamins -mercaptopurine -NSAIDs, medicines for pain and inflammation, like ibuprofen or naproxen -pemetrexed -phenylbutazone -phenytoin -probenecid -pyrimethamine -theophylline -trimetrexate -vaccines This list may not describe all possible interactions. Give your health care provider a list of all the medicines, herbs, non-prescription drugs, or dietary supplements you use. Also tell them if you smoke, drink alcohol, or use illegal drugs. Some items may interact with your medicine. What should I watch for while using this medicine? Visit your doctor for checks on your progress. You will need to have regular blood checks during your treatment to monitor your blood, liver function, and kidney function. This drug may make you feel generally unwell. This is not uncommon, as chemotherapy can affect healthy cells as well as cancer cells. Report any side effects. Continue your course of treatment even though you feel ill unless your doctor tells you to stop. In some cases, you may be given additional medicines to help with side effects. Follow all directions for their use. Call your doctor or health care professional for advice if you get a fever, chills or sore throat, or other symptoms of a cold or flu. Do not treat yourself. This drug decreases your body's ability to fight infections. Try to avoid being around people who are sick. This medicine may increase  your risk to bruise or bleed. Call your doctor or health care professional if you notice any unusual bleeding. Be careful brushing and flossing your teeth or using a toothpick because you may get an infection or bleed more easily. If you have any dental work done, tell your dentist you are  receiving this medicine. Avoid taking products that contain aspirin, acetaminophen, ibuprofen, naproxen, or ketoprofen unless instructed by your doctor. These medicines may hide a fever. This medicine can make you more sensitive to the sun. Keep out of the sun. If you cannot avoid being in the sun, wear protective clothing and use sunscreen. Do not use sun lamps or tanning beds/booths. Do not treat diarrhea with over the counter products. Contact your doctor if you have diarrhea. To protect your kidneys, drink water or other fluids as directed while you are taking this medicine. Do not drink alcohol-containing drinks while taking this medicine. Both alcohol and the medicine may cause damage to your liver. Men and women must use effective birth control while they are taking this medicine. Do not become pregnant while taking this medicine. Women must continue using effective birth control for 1 full menstrual cycle after stopping this medicine. Tell your doctor right away if you think that you or your partner might be pregnant. There is a potential for serious side effects to an unborn child. Talk to your health care professional or pharmacist for more information. Do not breast-feed an infant while taking this medicine. Men must continue effective birth control for 3 months after stopping this medicine. What side effects may I notice from receiving this medicine? Side effects that you should report to your doctor or health care professional as soon as possible: -allergic reactions like skin rash, itching or hives, swelling of the face, lips, or tongue -low blood counts - this medicine may decrease the number of white blood cells, red blood cells and platelets. You may be at increased risk for infections and bleeding. -signs of infection - fever or chills, cough, sore throat, pain or difficulty passing urine -signs of decreased platelets or bleeding - bruising, pinpoint red spots on the skin, black, tarry  stools, blood in the urine -signs of decreased red blood cells - unusually weak or tired, fainting spells, lightheadedness -breathing problems, like a dry cough -changes in vision -confusion, not alert -diarrhea -mouth or throat sores or ulcers -problems with balance, talking, walking -redness, blistering, peeling or loosening of the skin, including inside the mouth -seizures -trouble passing urine or change in the amount of urine -vomiting -yellowing of the eyes or skin Side effects that usually do not require medical attention (report to your doctor or health care professional if they continue or are bothersome): -change in skin color -eye irritation -hair loss -headache -loss of appetite -nausea -stomach upset This list may not describe all possible side effects. Call your doctor for medical advice about side effects. You may report side effects to FDA at 1-800-FDA-1088. Where should I keep my medicine? This drug is given in a hospital or clinic and will not be stored at home. NOTE: This sheet is a summary. It may not cover all possible information. If you have questions about this medicine, talk to your doctor, pharmacist, or health care provider.  2014, Elsevier/Gold Standard. (2007-07-09 11:13:24)   Fluorouracil, 5-FU injection What is this medicine? FLUOROURACIL, 5-FU (flure oh YOOR a sil) is a chemotherapy drug. It slows the growth of cancer cells. This medicine is used to treat many types  of cancer like breast cancer, colon or rectal cancer, pancreatic cancer, and stomach cancer. This medicine may be used for other purposes; ask your health care provider or pharmacist if you have questions. COMMON BRAND NAME(S): Adrucil What should I tell my health care provider before I take this medicine? They need to know if you have any of these conditions: -blood disorders -dihydropyrimidine dehydrogenase (DPD) deficiency -infection (especially a virus infection such as chickenpox,  cold sores, or herpes) -kidney disease -liver disease -malnourished, poor nutrition -recent or ongoing radiation therapy -an unusual or allergic reaction to fluorouracil, other chemotherapy, other medicines, foods, dyes, or preservatives -pregnant or trying to get pregnant -breast-feeding How should I use this medicine? This drug is given as an infusion or injection into a vein. It is administered in a hospital or clinic by a specially trained health care professional. Talk to your pediatrician regarding the use of this medicine in children. Special care may be needed. Overdosage: If you think you have taken too much of this medicine contact a poison control center or emergency room at once. NOTE: This medicine is only for you. Do not share this medicine with others. What if I miss a dose? It is important not to miss your dose. Call your doctor or health care professional if you are unable to keep an appointment. What may interact with this medicine? -allopurinol -cimetidine -dapsone -digoxin -hydroxyurea -leucovorin -levamisole -medicines for seizures like ethotoin, fosphenytoin, phenytoin -medicines to increase blood counts like filgrastim, pegfilgrastim, sargramostim -medicines that treat or prevent blood clots like warfarin, enoxaparin, and dalteparin -methotrexate -metronidazole -pyrimethamine -some other chemotherapy drugs like busulfan, cisplatin, estramustine, vinblastine -trimethoprim -trimetrexate -vaccines Talk to your doctor or health care professional before taking any of these medicines: -acetaminophen -aspirin -ibuprofen -ketoprofen -naproxen This list may not describe all possible interactions. Give your health care provider a list of all the medicines, herbs, non-prescription drugs, or dietary supplements you use. Also tell them if you smoke, drink alcohol, or use illegal drugs. Some items may interact with your medicine. What should I watch for while using  this medicine? Visit your doctor for checks on your progress. This drug may make you feel generally unwell. This is not uncommon, as chemotherapy can affect healthy cells as well as cancer cells. Report any side effects. Continue your course of treatment even though you feel ill unless your doctor tells you to stop. In some cases, you may be given additional medicines to help with side effects. Follow all directions for their use. Call your doctor or health care professional for advice if you get a fever, chills or sore throat, or other symptoms of a cold or flu. Do not treat yourself. This drug decreases your body's ability to fight infections. Try to avoid being around people who are sick. This medicine may increase your risk to bruise or bleed. Call your doctor or health care professional if you notice any unusual bleeding. Be careful brushing and flossing your teeth or using a toothpick because you may get an infection or bleed more easily. If you have any dental work done, tell your dentist you are receiving this medicine. Avoid taking products that contain aspirin, acetaminophen, ibuprofen, naproxen, or ketoprofen unless instructed by your doctor. These medicines may hide a fever. Do not become pregnant while taking this medicine. Women should inform their doctor if they wish to become pregnant or think they might be pregnant. There is a potential for serious side effects to an unborn child. Talk to  your health care professional or pharmacist for more information. Do not breast-feed an infant while taking this medicine. Men should inform their doctor if they wish to father a child. This medicine may lower sperm counts. Do not treat diarrhea with over the counter products. Contact your doctor if you have diarrhea that lasts more than 2 days or if it is severe and watery. This medicine can make you more sensitive to the sun. Keep out of the sun. If you cannot avoid being in the sun, wear protective  clothing and use sunscreen. Do not use sun lamps or tanning beds/booths. What side effects may I notice from receiving this medicine? Side effects that you should report to your doctor or health care professional as soon as possible: -allergic reactions like skin rash, itching or hives, swelling of the face, lips, or tongue -low blood counts - this medicine may decrease the number of white blood cells, red blood cells and platelets. You may be at increased risk for infections and bleeding. -signs of infection - fever or chills, cough, sore throat, pain or difficulty passing urine -signs of decreased platelets or bleeding - bruising, pinpoint red spots on the skin, black, tarry stools, blood in the urine -signs of decreased red blood cells - unusually weak or tired, fainting spells, lightheadedness -breathing problems -changes in vision -chest pain -mouth sores -nausea and vomiting -pain, swelling, redness at site where injected -pain, tingling, numbness in the hands or feet -redness, swelling, or sores on hands or feet -stomach pain -unusual bleeding Side effects that usually do not require medical attention (report to your doctor or health care professional if they continue or are bothersome): -changes in finger or toe nails -diarrhea -dry or itchy skin -hair loss -headache -loss of appetite -sensitivity of eyes to the light -stomach upset -unusually teary eyes This list may not describe all possible side effects. Call your doctor for medical advice about side effects. You may report side effects to FDA at 1-800-FDA-1088. Where should I keep my medicine? This drug is given in a hospital or clinic and will not be stored at home. NOTE: This sheet is a summary. It may not cover all possible information. If you have questions about this medicine, talk to your doctor, pharmacist, or health care provider.  2014, Elsevier/Gold Standard. (2007-05-06 13:53:16)

## 2012-12-16 ENCOUNTER — Telehealth: Payer: Self-pay | Admitting: *Deleted

## 2012-12-16 NOTE — Telephone Encounter (Signed)
Called pt at home for post chemo follow up call.  Pt stated she was doing fine.  Stated appetite good, and drinking lots of fluids as tolerated.  Denied nausea/vomiting; bowel and bladder function fine.  Went over instructions on how to take antiemetics as prescribed by md.  Reinforced with pt of not driving after taking either Compazine and/or Ativan due to drowsiness side effects.  Pt verbalized understanding.  Confirmed next return appt on 12/22/12.

## 2012-12-17 ENCOUNTER — Encounter: Payer: Self-pay | Admitting: Oncology

## 2012-12-17 NOTE — Progress Notes (Signed)
[facility], [id number], approved ondansetron from 12/17/12-12/17/13

## 2012-12-22 ENCOUNTER — Other Ambulatory Visit: Payer: Medicare Other | Admitting: Lab

## 2012-12-22 ENCOUNTER — Telehealth: Payer: Self-pay | Admitting: *Deleted

## 2012-12-22 ENCOUNTER — Ambulatory Visit: Payer: Medicare Other | Admitting: Adult Health

## 2012-12-22 NOTE — Telephone Encounter (Signed)
Called patient to R/S appt for today,she did not show up. She thought she was supposed to come on the 11th. We will reschedule her with Larna Daughters.

## 2012-12-25 ENCOUNTER — Encounter (INDEPENDENT_AMBULATORY_CARE_PROVIDER_SITE_OTHER): Payer: Self-pay | Admitting: Surgery

## 2012-12-25 ENCOUNTER — Ambulatory Visit (INDEPENDENT_AMBULATORY_CARE_PROVIDER_SITE_OTHER): Payer: Medicare Other | Admitting: Surgery

## 2012-12-25 VITALS — BP 132/82 | HR 64 | Resp 14 | Ht 65.0 in | Wt 197.0 lb

## 2012-12-25 DIAGNOSIS — Z09 Encounter for follow-up examination after completed treatment for conditions other than malignant neoplasm: Secondary | ICD-10-CM

## 2012-12-25 NOTE — Patient Instructions (Signed)
Come back in about three or four months. See Dr Johna Sheriff

## 2012-12-26 ENCOUNTER — Encounter (INDEPENDENT_AMBULATORY_CARE_PROVIDER_SITE_OTHER): Payer: Self-pay | Admitting: Surgery

## 2012-12-26 ENCOUNTER — Other Ambulatory Visit: Payer: Self-pay | Admitting: Internal Medicine

## 2012-12-26 NOTE — Progress Notes (Signed)
NAME: Monica Lee                                            DOB: [birth year] DATE: 12/26/2012                                                   MRN: [id number]  CC:  Chief Complaint  Patient presents with  . Routine Post Op    PAC insertion    HPI: This patient comes in for post op follow-up .Sheunderwent PAC placement on 11/30/12. She feels that she is doing well.The port was used successfully for her first chemo  PE:  VITAL SIGNS: BP 132/82  Pulse 64  Resp 14  Ht 5\' 5"  (1.651 m)  Wt 197 lb (89.359 kg)  BMI 32.78 kg/m2  General: The patient appears to be healthy, NAD Incision healing well  IMPRESSION: The patient is doing well S/P port placement, ongoing chemo.    PLAN: Will see in 4-5 months as she finishes chemo

## 2012-12-28 ENCOUNTER — Other Ambulatory Visit: Payer: Self-pay | Admitting: *Deleted

## 2012-12-28 MED ORDER — ISOSORBIDE MONONITRATE ER 60 MG PO TB24: 60.0000 mg | ORAL_TABLET | Freq: Every day | ORAL | Status: DC

## 2012-12-29 ENCOUNTER — Telehealth: Payer: Self-pay

## 2012-12-29 MED ORDER — ALBUTEROL SULFATE 0.63 MG/3ML IN NEBU: 1.0000 | INHALATION_SOLUTION | Freq: Four times a day (QID) | RESPIRATORY_TRACT | Status: DC | PRN

## 2012-12-29 NOTE — Telephone Encounter (Signed)
Resent rx with diagnosis code.

## 2012-12-31 ENCOUNTER — Telehealth: Payer: Self-pay

## 2012-12-31 DIAGNOSIS — J449 Chronic obstructive pulmonary disease, unspecified: Secondary | ICD-10-CM

## 2012-12-31 NOTE — Telephone Encounter (Signed)
The patient never received a nebulizer from home health.  Please advise if need order

## 2012-12-31 NOTE — Telephone Encounter (Signed)
rx for DME neb machine - Done hardcopy to robin

## 2013-01-01 NOTE — Progress Notes (Signed)
I called [facility] and spoke to [place] and he said it was rejecting for "Refill too soon".  The authorization is from 09/18/12 to 12/17/13. Auth. #[id number]. He said it should go thru now.  Completed the form and back to [facility].

## 2013-01-01 NOTE — Telephone Encounter (Signed)
done

## 2013-01-01 NOTE — Telephone Encounter (Signed)
[facility] need for you to put a referral in for a neb machine, they will then contact [facility] who provides neb's and can then fax over hardcopy.

## 2013-01-04 ENCOUNTER — Other Ambulatory Visit: Payer: Medicare Other | Admitting: Lab

## 2013-01-04 ENCOUNTER — Ambulatory Visit: Payer: Medicare Other

## 2013-01-05 ENCOUNTER — Encounter: Payer: Self-pay | Admitting: *Deleted

## 2013-01-11 ENCOUNTER — Ambulatory Visit (INDEPENDENT_AMBULATORY_CARE_PROVIDER_SITE_OTHER): Payer: Medicare Other | Admitting: Cardiovascular Disease

## 2013-01-11 ENCOUNTER — Encounter: Payer: Self-pay | Admitting: Cardiovascular Disease

## 2013-01-11 VITALS — BP 138/78 | HR 68 | Ht 65.0 in | Wt 194.0 lb

## 2013-01-11 DIAGNOSIS — I319 Disease of pericardium, unspecified: Secondary | ICD-10-CM

## 2013-01-11 DIAGNOSIS — I2510 Atherosclerotic heart disease of native coronary artery without angina pectoris: Secondary | ICD-10-CM | POA: Diagnosis not present

## 2013-01-11 DIAGNOSIS — E1159 Type 2 diabetes mellitus with other circulatory complications: Secondary | ICD-10-CM

## 2013-01-11 DIAGNOSIS — I5033 Acute on chronic diastolic (congestive) heart failure: Secondary | ICD-10-CM

## 2013-01-11 DIAGNOSIS — Z940 Kidney transplant status: Secondary | ICD-10-CM

## 2013-01-11 DIAGNOSIS — I313 Pericardial effusion (noninflammatory): Secondary | ICD-10-CM

## 2013-01-11 DIAGNOSIS — I119 Hypertensive heart disease without heart failure: Secondary | ICD-10-CM

## 2013-01-11 NOTE — Assessment & Plan Note (Signed)
Euvolemic continue current meds   

## 2013-01-11 NOTE — Patient Instructions (Signed)

## 2013-01-11 NOTE — Progress Notes (Signed)
Patient ID: Monica Lee, female   DOB: November 16, 1950, 62 y.o.   MRN: [id number] Monica Lee is a 62 y.o. female the history of T2DM, HTN, diastolic CHF, hyperthyroidism, renal failure status post renal transplant, breast CA, prior TIA. She apparently had cardiac catheterization several years ago in [place] that demonstrated no CAD. She was recently seen by Dr. Eden Emms for surgical clearance for breast cancer. Lexiscan Myoview (11/05/12): High-risk scan; fixed defect affecting the entire inferolateral and anterolateral wall; mid/apical anterior and mid/apical inferior moderate ischemia, EF 28%. Cardiac MRI (10/2012): Moderate to severe LVH, EF 55%, chordal SAM but no LVOT gradient, mod circumferential effusion, mild LAE. Echocardiogram (11/06/12): Severe LVH, EF 60-65%, grade 1 diastolic dysfunction, moderate effusion. Tests were reviewed by Dr. Antoine Poche. Given discrepancy of results and lack of symptoms, it was felt the nuclear study was false positive and she was cleared to proceed with lumpectomy on 11/08/12.  She was admitted 10/28-11/3 with a/c diastolic CHF. She presented to the ED with worsening dyspnea and an 8 pound weight gain, orthopnea and PND. She was diuresed with IV Lasix. She was seen by Dr. Gala Romney and the Heart Failure Team. Metolazone was added to her medical regimen. She is doing well since d/c. Her breathing is better. She denies orthopnea, PND, chest pain, syncope. LE edema is much improved.  Recent Labs:  04/09/2012: HDL 39.80  11/11/2012: ALT 28; Hemoglobin 12.0  11/12/2012: TSH 40.047*  11/14/2012: Creatinine 1.68*; Potassium 4.1  11/15/2012: Pro B Natriuretic peptide (BNP) 4299.0*  Had porta cath placed 11/17 and did well with first round of chemo.  11/12/12 TSH was 40 not on replacement    ROS: Denies fever, malais, weight loss, blurry vision, decreased visual acuity, cough, sputum, SOB, hemoptysis, pleuritic pain, palpitaitons, heartburn, abdominal pain, melena, lower  extremity edema, claudication, or rash.  All other systems reviewed and negative  General: Affect appropriate Healthy:  appears stated age HEENT: normal Neck supple with no adenopathy JVP normal no bruits no thyromegaly Lungs clear with no wheezing and good diaphragmatic motion Heart:  S1/S2 no murmur, no rub, gallop or click PMI normal Abdomen: benighn, BS positve, no tenderness, no AAA no bruit.  No HSM or HJR Distal pulses intact with no bruits No edema Neuro non-focal Skin warm and dry No muscular weakness   Current Outpatient Prescriptions  Medication Sig Dispense Refill  . albuterol (ACCUNEB) 0.63 MG/3ML nebulizer solution USE ONE VIAL IN NEBULIZER EVERY 6 HOURS AS NEEDED FOR WHEEZING  90 mL  0  . amLODipine (NORVASC) 10 MG tablet Take 1 tablet (10 mg total) by mouth daily.  90 tablet  3  . aspirin EC 325 MG tablet Take 325 mg by mouth daily.        Marland Kitchen azaTHIOprine (IMURAN) 50 MG tablet Take 50 mg by mouth 2 (two) times daily.        . brimonidine (ALPHAGAN) 0.2 % ophthalmic solution Place 1 drop into the right eye 2 (two) times daily.      . cinacalcet (SENSIPAR) 30 MG tablet Take 1 tablet (30 mg total) by mouth daily.  30 tablet  11  . dexamethasone (DECADRON) 4 MG tablet Take 2 tablets (8 mg total) by mouth 2 (two) times daily with a meal. Take daily starting the day after chemotherapy for 2 days. Take with food.  30 tablet  1  . docusate sodium (COLACE) 100 MG capsule Take 1 capsule (100 mg total) by mouth 2 (two) times daily.  60 capsule  0  . esomeprazole (NEXIUM) 40 MG capsule Take 40 mg by mouth daily before breakfast.      . furosemide (LASIX) 80 MG tablet Take 160 mg by mouth 2 (two) times daily.       . insulin aspart (NOVOLOG) 100 UNIT/ML injection Inject 13-16 Units into the skin 3 (three) times daily. Injects 13 units daily at breakfast, 13 units daily at lunch, and 16 units daily at supper.      . insulin glargine (LANTUS) 100 UNIT/ML injection Inject 55 Units  into the skin at bedtime.      . isosorbide mononitrate (IMDUR) 60 MG 24 hr tablet Take 1 tablet (60 mg total) by mouth daily.  90 tablet  3  . lidocaine-prilocaine (EMLA) cream Apply topically as needed.  30 g  6  . LORazepam (ATIVAN) 0.5 MG tablet Take 1 tablet (0.5 mg total) by mouth every 6 (six) hours as needed (Nausea or vomiting).  30 tablet  0  . Magnesium Oxide 250 MG TABS Take 250 mg by mouth daily.       . metoprolol (LOPRESSOR) 100 MG tablet Take 1 tablet (100 mg total) by mouth 2 (two) times daily.  180 tablet  2  . minoxidil (LONITEN) 2.5 MG tablet Take 2.5 mg by mouth 2 (two) times daily.      . nitroGLYCERIN (NITROSTAT) 0.4 MG SL tablet Place 1 tablet (0.4 mg total) under the tongue every 5 (five) minutes as needed for chest pain.  25 tablet  11  . ondansetron (ZOFRAN) 8 MG tablet Take 1 tablet (8 mg total) by mouth 2 (two) times daily. Take two times a day starting the day after chemo for 2 days. Then take two times a day as needed for nausea or vomiting.  30 tablet  1  . polyethylene glycol (MIRALAX / GLYCOLAX) packet Take 17 g by mouth daily.  14 each  11  . potassium chloride (KLOR-CON 10) 10 MEQ tablet Take 1 tablet (10 mEq total) by mouth 2 (two) times daily.  90 tablet  3  . pravastatin (PRAVACHOL) 20 MG tablet Take 10 mg by mouth at bedtime.        . prednisoLONE acetate (PRED FORTE) 1 % ophthalmic suspension Place 1 drop into the left eye 3 (three) times daily.      . predniSONE (DELTASONE) 10 MG tablet Take 10 mg by mouth daily.      . prochlorperazine (COMPAZINE) 10 MG tablet Take 1 tablet (10 mg total) by mouth every 6 (six) hours as needed (Nausea or vomiting).  30 tablet  1  . tacrolimus (PROGRAF) 1 MG capsule Take 9 mg by mouth 2 (two) times daily.       . timolol (TIMOPTIC) 0.25 % ophthalmic solution Place 1 drop into the right eye 2 (two) times daily.      . traMADol (ULTRAM) 50 MG tablet Take 50 mg by mouth every 6 (six) hours as needed for pain.       No current  facility-administered medications for this visit.    Allergies  Cephalexin and Propoxyphene n-acetaminophen  Electrocardiogram:  SR rate 72 LVH with strain   Assessment and Plan

## 2013-01-11 NOTE — Assessment & Plan Note (Signed)
No symptoms Likely false positive myovue  MRI with normal EF no scar and previous cath with no CAD

## 2013-01-11 NOTE — Assessment & Plan Note (Signed)
Discussed low carb diet.  Target hemoglobin A1c is 6.5 or less.  Continue current medications.  

## 2013-01-11 NOTE — Assessment & Plan Note (Signed)
Well controlled.  Continue current medications and low sodium Dash type diet.    

## 2013-01-11 NOTE — Assessment & Plan Note (Signed)
Avoid cath/dye if possible continue cellcept and steroids

## 2013-01-11 NOTE — Assessment & Plan Note (Signed)
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 Related to hypothyroidism  Will ask Dr Jonny Ruiz to address and discuss with patient

## 2013-01-12 ENCOUNTER — Telehealth: Payer: Self-pay | Admitting: *Deleted

## 2013-01-12 ENCOUNTER — Encounter: Payer: Self-pay | Admitting: Adult Health

## 2013-01-12 ENCOUNTER — Ambulatory Visit (HOSPITAL_BASED_OUTPATIENT_CLINIC_OR_DEPARTMENT_OTHER): Payer: Medicare Other | Admitting: Adult Health

## 2013-01-12 ENCOUNTER — Other Ambulatory Visit (HOSPITAL_BASED_OUTPATIENT_CLINIC_OR_DEPARTMENT_OTHER): Payer: Medicare Other

## 2013-01-12 ENCOUNTER — Telehealth: Payer: Self-pay | Admitting: Oncology

## 2013-01-12 ENCOUNTER — Ambulatory Visit (HOSPITAL_BASED_OUTPATIENT_CLINIC_OR_DEPARTMENT_OTHER): Payer: Medicare Other

## 2013-01-12 VITALS — BP 118/69 | HR 77 | Temp 98.5°F | Resp 18 | Ht 65.0 in | Wt 195.1 lb

## 2013-01-12 DIAGNOSIS — B370 Candidal stomatitis: Secondary | ICD-10-CM

## 2013-01-12 DIAGNOSIS — I5032 Chronic diastolic (congestive) heart failure: Secondary | ICD-10-CM | POA: Diagnosis not present

## 2013-01-12 DIAGNOSIS — C50511 Malignant neoplasm of lower-outer quadrant of right female breast: Secondary | ICD-10-CM

## 2013-01-12 DIAGNOSIS — Z940 Kidney transplant status: Secondary | ICD-10-CM

## 2013-01-12 DIAGNOSIS — C50519 Malignant neoplasm of lower-outer quadrant of unspecified female breast: Secondary | ICD-10-CM

## 2013-01-12 DIAGNOSIS — Z5111 Encounter for antineoplastic chemotherapy: Secondary | ICD-10-CM

## 2013-01-12 LAB — COMPREHENSIVE METABOLIC PANEL (CC13)
ALT: 24 U/L (ref 0–55)
AST: 25 U/L (ref 5–34)
Albumin: 3.8 g/dL (ref 3.5–5.0)
Anion Gap: 16 mEq/L — ABNORMAL HIGH (ref 3–11)
BUN: 31.9 mg/dL — ABNORMAL HIGH (ref 7.0–26.0)
CO2: 27 mEq/L (ref 22–29)
Calcium: 9.1 mg/dL (ref 8.4–10.4)
Chloride: 100 mEq/L (ref 98–109)
Creatinine: 1.5 mg/dL — ABNORMAL HIGH (ref 0.6–1.1)
Glucose: 258 mg/dl — ABNORMAL HIGH (ref 70–140)
Potassium: 3.9 mEq/L (ref 3.5–5.1)
Sodium: 143 mEq/L (ref 136–145)

## 2013-01-12 LAB — CBC WITH DIFFERENTIAL/PLATELET
BASO%: 0.3 % (ref 0.0–2.0)
Basophils Absolute: 0 10*3/uL (ref 0.0–0.1)
EOS%: 0.3 % (ref 0.0–7.0)
HCT: 40.2 % (ref 34.8–46.6)
HGB: 12.9 g/dL (ref 11.6–15.9)
LYMPH%: 18.3 % (ref 14.0–49.7)
MCH: 29.2 pg (ref 25.1–34.0)
MCHC: 32.1 g/dL (ref 31.5–36.0)
MONO#: 0.4 10*3/uL (ref 0.1–0.9)
NEUT%: 73.8 % (ref 38.4–76.8)
Platelets: 124 10*3/uL — ABNORMAL LOW (ref 145–400)
RDW: 16.7 % — ABNORMAL HIGH (ref 11.2–14.5)
WBC: 5.9 10*3/uL (ref 3.9–10.3)

## 2013-01-12 MED ORDER — METHOTREXATE SODIUM CHEMO INJECTION 25 MG/ML
40.0000 mg/m2 | Freq: Once | INTRAMUSCULAR | Status: AC
  Administered 2013-01-12: 75 mg via INTRAVENOUS
  Filled 2013-01-12: qty 3

## 2013-01-12 MED ORDER — HEPARIN SOD (PORK) LOCK FLUSH 100 UNIT/ML IV SOLN
500.0000 [IU] | Freq: Once | INTRAVENOUS | Status: AC | PRN
  Administered 2013-01-12: 500 [IU]
  Filled 2013-01-12: qty 5

## 2013-01-12 MED ORDER — SODIUM CHLORIDE 0.9 % IJ SOLN
10.0000 mL | INTRAMUSCULAR | Status: DC | PRN
  Administered 2013-01-12: 10 mL
  Filled 2013-01-12: qty 10

## 2013-01-12 MED ORDER — NYSTATIN 100000 UNIT/ML MT SUSP: 5.0000 mL | Freq: Four times a day (QID) | OROMUCOSAL | Status: DC

## 2013-01-12 MED ORDER — SODIUM CHLORIDE 0.9 % IV SOLN
Freq: Once | INTRAVENOUS | Status: AC
  Administered 2013-01-12: 11:00:00 via INTRAVENOUS

## 2013-01-12 MED ORDER — FLUOROURACIL CHEMO INJECTION 2.5 GM/50ML
600.0000 mg/m2 | Freq: Once | INTRAVENOUS | Status: AC
  Administered 2013-01-12: 1200 mg via INTRAVENOUS
  Filled 2013-01-12: qty 24

## 2013-01-12 MED ORDER — SODIUM CHLORIDE 0.9 % IV SOLN
600.0000 mg/m2 | Freq: Once | INTRAVENOUS | Status: AC
  Administered 2013-01-12: 1220 mg via INTRAVENOUS
  Filled 2013-01-12: qty 61

## 2013-01-12 MED ORDER — DEXAMETHASONE SODIUM PHOSPHATE 10 MG/ML IJ SOLN
INTRAMUSCULAR | Status: AC
  Filled 2013-01-12: qty 1

## 2013-01-12 MED ORDER — ONDANSETRON 8 MG/50ML IVPB (CHCC)
8.0000 mg | Freq: Once | INTRAVENOUS | Status: AC
  Administered 2013-01-12: 8 mg via INTRAVENOUS

## 2013-01-12 MED ORDER — DEXAMETHASONE SODIUM PHOSPHATE 10 MG/ML IJ SOLN
10.0000 mg | Freq: Once | INTRAMUSCULAR | Status: AC
  Administered 2013-01-12: 10 mg via INTRAVENOUS

## 2013-01-12 MED ORDER — ONDANSETRON 8 MG/NS 50 ML IVPB
INTRAVENOUS | Status: AC
  Filled 2013-01-12: qty 8

## 2013-01-12 NOTE — Telephone Encounter (Signed)
Per staff message and POF I have scheduled appts.  JMW  

## 2013-01-12 NOTE — Progress Notes (Signed)
Monica Lee [id number] [birth year] 62 y.o. 01/14/2013 9:17 AM  CC  Oliver Barre, MD [address] [place] [address] Dr. Chipper Herb Dr. 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   STAGE:   Breast cancer of lower-outer quadrant of right female breast   Primary site: Breast (Right)   Staging method: AJCC 7th Edition   Clinical: Stage IIA (T2, N0, cM0)   Summary: Stage IIA (T2, N0, cM0)  REFERRING PHYSICIAN: Dr. Cyndia Bent  Prior therapy:  Monica Lee is a 62 y.o. female.   #1 Who felt a mass along the inferior aspect of the right breast at about the 7:00 position. On 10/08/2012 she had a mammogram performed that showed a irregular mass with internal pleomorphic calcifications. Ultrasound showed a suspicious 2.1 x 1.3 x 1.6 cm mass in the inframammary fold. A needle core biopsy performed on the same day was diagnostic for invasive ductal carcinoma with associated DCIS. The tumor was ER negative PR negative and HER-2/neu negative. The proliferation marker Ki-67 was elevated at 50% and the tumor was grade 2.   #2 patient is status post right lumpectomy with sentinel lymph node biopsy. Her final pathology revealed 2.2 cm invasive ductal carcinoma that was grade 3 that was triple negative with a proliferation marker Ki-67 at 56%.  #3 adjuvant chemotherapy beginning December 2014 with CMF every 21 days for total of 6 cycles   Current therapy: Adjuvant CMF  Interval history: Patient is here for evaluation prior to CMF chemotherapy.  She tolerated her first cycle very well.  She does have the sensation of carpet, and dryness in her mouth.  She denies any pain or dysphagia.  She denies fevers, chills, nausea, vomiting, constipation, diarrhea, numbness, chest pain, palpitations, DOE, orthopnea, dysuria, or any further concerns.    Past Medical History: Past Medical History   Diagnosis Date  . Diabetes mellitus   . Acid reflux   . S/P kidney transplant   . GERD (gastroesophageal reflux disease)   . Hyperlipidemia   . DJD (degenerative joint disease)   . TIA (transient ischemic attack)   . Hx of colonic polyps   . PUD (peptic ulcer disease)   . Diabetic retinopathy   . Hemorrhoids   . Generalized headaches   . Hypertension   . Asthma   . MI (myocardial infarction) 2011  . Kidney Nazari   . Mental disorder   . Stroke 2008    no deficits  . Anemia     Hx-not current  . Breast cancer   . Blind left eye   . Pneumonia   . Chronic diastolic CHF (congestive heart failure)     a. Cardiac MRI (10/2012): Moderate to severe LVH, EF 55%, chordal SAM but no LVOT gradient, mod circumferential effusion, mild LAE.  b.  Echocardiogram (11/06/12): Severe LVH, EF 60-65%, grade 1 diastolic dysfunction, moderate effusion.  Marland Kitchen Hx of cardiovascular stress test     a. Lexiscan Myoview (11/05/12): High-risk scan; fixed defect affecting the entire inferolateral and anterolateral wall; mid/apical anterior and mid/apical inferior moderate ischemia, EF 28%.  (felt to be false + as echo and cMRI with normal EF and normal WM)  . Hx of cardiac cath     a. reportedly no CAD on cardiac cath in [place]  . Wears glasses     Past Surgical History: Past Surgical History  Procedure Laterality Date  .  Transplantation renal  05/2006  . Cardiac catheterization  2011  . Abdominal hysterectomy    . Cataract extraction  bil  . Intestinal perforation surgery  01/2009  . Breast biopsy  2010  . Gallstones removed    . Cholecystectomy  2008  . Colon surgery  2013    polyps removed  . Hemorrhoid surgery  01/23/2012    Procedure: HEMORRHOIDECTOMY PROLAPSED;  Surgeon: Romie Levee, MD;  Location: [facility];  Service: General;  Laterality: N/A;  . Eye surgery Left 1997  . Foot surgery Right 2013  . Breast lumpectomy with sentinel lymph node biopsy Right 11/09/2012     Procedure: RIGHT BREAST LUMPECTOMY WITH SENTINEL LYMPH NODE REMOVAL;  Surgeon: Currie Paris, MD;  Location: [facility] OR;  Service: General;  Laterality: Right;  . Portacath placement Right 11/30/2012    Procedure: INSERTION PORT-A-CATH;  Surgeon: Currie Paris, MD;  Location: [facility];  Service: General;  Laterality: Right;    Family History: Family History  Problem Relation Age of Onset  . Kidney disease Neg Hx   . Arthritis Other     parent  . Heart disease Other     parent  . Hypertension Other     parent  . Diabetes Other     parent  . Diabetes Other     grandparent  . Thyroid disease Other     several  . Heart disease Father     Social History History  Substance Use Topics  . Smoking status: Never Smoker   . Smokeless tobacco: Never Used  . Alcohol Use: No    Allergies: Allergies  Allergen Reactions  . Cephalexin Swelling    Swelling to face, chills  . Propoxyphene N-Acetaminophen Hives and Itching    Current Medications: Current Outpatient Prescriptions  Medication Sig Dispense Refill  . amLODipine (NORVASC) 10 MG tablet Take 1 tablet (10 mg total) by mouth daily.  90 tablet  3  . aspirin EC 325 MG tablet Take 325 mg by mouth daily.        Marland Kitchen azaTHIOprine (IMURAN) 50 MG tablet Take 50 mg by mouth 2 (two) times daily.        . brimonidine (ALPHAGAN) 0.2 % ophthalmic solution Place 1 drop into the right eye 2 (two) times daily.      . cinacalcet (SENSIPAR) 30 MG tablet Take 1 tablet (30 mg total) by mouth daily.  30 tablet  11  . dexamethasone (DECADRON) 4 MG tablet Take 2 tablets (8 mg total) by mouth 2 (two) times daily with a meal. Take daily starting the day after chemotherapy for 2 days. Take with food.  30 tablet  1  . docusate sodium (COLACE) 100 MG capsule Take 1 capsule (100 mg total) by mouth 2 (two) times daily.  60 capsule  0  . esomeprazole (NEXIUM) 40 MG capsule Take 40 mg by mouth daily before breakfast.      . furosemide  (LASIX) 80 MG tablet Take 160 mg by mouth 3 (three) times daily.       . insulin aspart (NOVOLOG) 100 UNIT/ML injection Inject 13-16 Units into the skin 3 (three) times daily. Injects 13 units daily at breakfast, 13 units daily at lunch, and 16 units daily at supper.      . insulin glargine (LANTUS) 100 UNIT/ML injection Inject 55 Units into the skin at bedtime.      . isosorbide mononitrate (IMDUR) 60 MG 24 hr tablet Take 1  tablet (60 mg total) by mouth daily.  90 tablet  3  . lidocaine-prilocaine (EMLA) cream Apply topically as needed.  30 g  6  . LORazepam (ATIVAN) 0.5 MG tablet Take 1 tablet (0.5 mg total) by mouth every 6 (six) hours as needed (Nausea or vomiting).  30 tablet  0  . Magnesium Oxide 250 MG TABS Take 250 mg by mouth daily.       . metoprolol (LOPRESSOR) 100 MG tablet Take 1 tablet (100 mg total) by mouth 2 (two) times daily.  180 tablet  2  . minoxidil (LONITEN) 2.5 MG tablet Take 2.5 mg by mouth 2 (two) times daily.      . polyethylene glycol (MIRALAX / GLYCOLAX) packet Take 17 g by mouth daily.  14 each  11  . potassium chloride (KLOR-CON 10) 10 MEQ tablet Take 1 tablet (10 mEq total) by mouth 2 (two) times daily.  90 tablet  3  . pravastatin (PRAVACHOL) 20 MG tablet Take 10 mg by mouth at bedtime.        . prednisoLONE acetate (PRED FORTE) 1 % ophthalmic suspension Place 1 drop into the left eye 3 (three) times daily.      . predniSONE (DELTASONE) 10 MG tablet Take 10 mg by mouth daily.      . tacrolimus (PROGRAF) 1 MG capsule Take 9 mg by mouth 2 (two) times daily.       . timolol (TIMOPTIC) 0.25 % ophthalmic solution Place 1 drop into the right eye 2 (two) times daily.      . traMADol (ULTRAM) 50 MG tablet Take 50 mg by mouth every 6 (six) hours as needed for pain.      Marland Kitchen albuterol (ACCUNEB) 0.63 MG/3ML nebulizer solution USE ONE VIAL IN NEBULIZER EVERY 6 HOURS AS NEEDED FOR WHEEZING  90 mL  0  . nitroGLYCERIN (NITROSTAT) 0.4 MG SL tablet Place 1 tablet (0.4 mg total) under  the tongue every 5 (five) minutes as needed for chest pain.  25 tablet  11  . nystatin (MYCOSTATIN) 100000 UNIT/ML suspension Take 5 mLs (500,000 Units total) by mouth 4 (four) times daily.  60 mL  0  . ondansetron (ZOFRAN) 8 MG tablet Take 1 tablet (8 mg total) by mouth 2 (two) times daily. Take two times a day starting the day after chemo for 2 days. Then take two times a day as needed for nausea or vomiting.  30 tablet  1  . prochlorperazine (COMPAZINE) 10 MG tablet Take 1 tablet (10 mg total) by mouth every 6 (six) hours as needed (Nausea or vomiting).  30 tablet  1   No current facility-administered medications for this visit.      REVIEW OF SYSTEMS: A 10 point review of systems was conducted and is otherwise negative except for what is noted above.     PHYSICAL EXAMINATION: Blood pressure 118/69, pulse 77, temperature 98.5 F (36.9 C), temperature source Oral, resp. rate 18, height 5\' 5"  (1.651 m), weight 195 lb 1.6 oz (88.497 kg). General: Patient is a well appearing female in no acute distress HEENT: PERRLA, sclerae anicteric no conjunctival pallor, MMM, white patches along tongue  Neck: supple, no palpable adenopathy Lungs: clear to auscultation bilaterally, no wheezes, rhonchi, or rales Cardiovascular: regular rate rhythm, S1, S2, no murmurs, rubs or gallops Abdomen: Soft, non-tender, non-distended, normoactive bowel sounds, no HSM Extremities: warm and well perfused, no clubbing, cyanosis, or edema Skin: No rashes or lesions Neuro: Non-focal ECOG PERFORMANCE STATUS: 1 -  Symptomatic but completely ambulatory   STUDIES/RESULTS: US Breast Right  10/08/2012   CLINICAL DATA:  Palpable area of concern in the 7 o'clock region of the right breast.  EXAM: DIGITAL DIAGNOSTIC BILATERALMAMMOGRAM WITH CAD  ULTRASOUND RIGHT BREAST  COMPARISON:  WITH PRIORS  ACR Breast Density Category c: The breast tissue is heterogeneously dense.  FINDINGS: In the deep aspect of the lower outer quadrant  of the right breast is an irregular mass with internal pleomorphic calcifications.  No mass, suspicious microcalcification, or distortion is identified in the left breast to suggest malignancy.  Mammographic images were processed with CAD.  On physical exam there is a firm approximately 2.5 cm mass in the 7 o'clock position right breast approximately 9 cm from the nipple near the inframammary fold. No additional mass is palpated in the right breast. No axillary lymphadenopathy is palpated on the right.  Ultrasound is performed, showing an irregular heterogeneous and hypoechoic mass measuring approximately 2.1 x 1.3 x 1.6 cm. There is internal vascular flow. Small echogenicities within the mass are consistent with the suspicious calcifications seen on mammography.  Ultrasound the right axilla demonstrates a normal axillary lymph node. No lymphadenopathy is detected.  IMPRESSION: Suspicious 2.1 x 1.3 x 1.6 cm palpable mass 10 o'clock position right breast near the inframammary fold. Findings are suggestive of malignancy and ultrasound-guided biopsy is recommended and will be performed today and dictated separately.  RECOMMENDATION: Ultrasound-guided biopsy of a right breast mass.  I have discussed the findings and recommendations with the patient. Results were also provided in writing at the conclusion of the visit. If applicable, a reminder letter will be sent to the patient regarding the next appointment.  BI-RADS CATEGORY  5: Highly suggestive of malignancy - appropriate action should be taken.   Electronically Signed   By: Britta Mccreedy   On: 10/08/2012 16:45   Mm Digital Diagnostic Bilat  10/08/2012   CLINICAL DATA:  Palpable area of concern in the 7 o'clock region of the right breast.  EXAM: DIGITAL DIAGNOSTIC BILATERALMAMMOGRAM WITH CAD  ULTRASOUND RIGHT BREAST  COMPARISON:  WITH PRIORS  ACR Breast Density Category c: The breast tissue is heterogeneously dense.  FINDINGS: In the deep aspect of the lower  outer quadrant of the right breast is an irregular mass with internal pleomorphic calcifications.  No mass, suspicious microcalcification, or distortion is identified in the left breast to suggest malignancy.  Mammographic images were processed with CAD.  On physical exam there is a firm approximately 2.5 cm mass in the 7 o'clock position right breast approximately 9 cm from the nipple near the inframammary fold. No additional mass is palpated in the right breast. No axillary lymphadenopathy is palpated on the right.  Ultrasound is performed, showing an irregular heterogeneous and hypoechoic mass measuring approximately 2.1 x 1.3 x 1.6 cm. There is internal vascular flow. Small echogenicities within the mass are consistent with the suspicious calcifications seen on mammography.  Ultrasound the right axilla demonstrates a normal axillary lymph node. No lymphadenopathy is detected.  IMPRESSION: Suspicious 2.1 x 1.3 x 1.6 cm palpable mass 10 o'clock position right breast near the inframammary fold. Findings are suggestive of malignancy and ultrasound-guided biopsy is recommended and will be performed today and dictated separately.  RECOMMENDATION: Ultrasound-guided biopsy of a right breast mass.  I have discussed the findings and recommendations with the patient. Results were also provided in writing at the conclusion of the visit. If applicable, a reminder letter will be sent to the patient  regarding the next appointment.  BI-RADS CATEGORY  5: Highly suggestive of malignancy - appropriate action should be taken.   Electronically Signed   By: Britta Mccreedy   On: 10/08/2012 16:45   Mm Digital Diagnostic Unilat R  10/08/2012   CLINICAL DATA:  Ultrasound-guided biopsy of right breast mass was performed.  EXAM: DIGITAL DIAGNOSTIC UNILATERAL RIGHT MAMMOGRAM  COMPARISON:  Previous exams.  FINDINGS: Films are performed following ultrasound guided biopsy of probable some suspicious right breast mass at 7 o'clock position  near the inframammary fold. A dumbbell shaped biopsy clip is in satisfactory position within the mass.  IMPRESSION: Satisfactory position of biopsy clip in the right breast mass.  Final Assessment: Post Procedure Mammograms for Marker Placement   Electronically Signed   By: Britta Mccreedy   On: 10/08/2012 16:48   Mr Card Morphology W/o Cm  10/29/2012   CLINICAL DATA:  Cardiomyopathy:  Assess EF  EXAM: CARDIAC MRI  TECHNIQUE: The patient was scanned on a 1.5 Tesla GE magnet. A dedicated cardiac coil was used. Functional imaging was done using Fiesta sequences. 2,3, and 4 chamber views were done to assess for RWMA's. Modified Simpson's rule using a short axis stack was used to calculate an ejection fraction on a dedicated work [employer]. No multihance was used due to the patient previous renal transplant.  CONTRAST:  None  FINDINGS: There was moderate to severel LVH with septal thickness 15mm. There was a moderate circumferential pericardial effusion. There was mild LAE Right sided cardiac chambers were normal with no diastolic flattening. The aortic valve had some turbulence to flow in systole There was no significant MR or AR. The LV cavity size was small with prominent papillary muscles and a narrow LVOT. There was chordal SAM with no evidence of sub valvular obstruction.  There were no discrete RWMA;s Quantitative EF was 55%(EDV 130 cc ESV 59 cc and SV 71cc) Global longitudinal strain was normal at 1 21.5%  IMPRESSION: 1) Moderate to severe LVH with normal EF 55% Small LV cavity size with chordal SAM but o LVOT gradient or true HOCM  2) Normal longitudinal strain -21.5%  3) Moderate circumferential pericardial effusion  4) Normal RV/RA  5) Mild LAE  No gadolinium given so no infarct imaging done due to patients history of renal transplant  Charlton Haws   Electronically Signed   By: Charlton Haws M.D.   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 This is concordant. The patient was notified of results the afternoon of 10/09/2012 by Dr. Jean Rosenthal. She has no complaints regarding her biopsy site. She is scheduled for multidisciplinary clinic on 10/14/2012. Due to the patient's renal transplant, no breast MRI is scheduled.   Electronically Signed   By: Jerene Dilling M.D.   On: 10/09/2012 16:46   10/09/2012   CLINICAL DATA:  Suspicious palpable mass 10 o'clock position right breast.  EXAM: US BREAST BX W LOC DEV 1ST LESION IMG BX SPEC US GUIDE*R*  COMPARISON:  Previous exams.  PROCEDURE: I met with the patient and we discussed the procedure of ultrasound-guided biopsy, including benefits and alternatives. We discussed the high likelihood of a successful procedure. We discussed the risks of the procedure including infection, bleeding, tissue injury, clip migration, and inadequate sampling. Informed written consent was given.  Using sterile technique and 2% Lidocaine as local anesthetic, under direct ultrasound visualization, a 12 gauge vacuum-assisteddevice was used to perform biopsy of an approximate 2.1 cm solid mass at the 7 o'clock position 9 cm from the nipple using a lateral to medial approach. At the conclusion of the procedure, a dumbbell-shaped tissue marker clip was deployed into the biopsy cavity. Follow-up 2-view mammogram was performed and dictated separately.  The usual time-out protocol was performed immediately prior to the procedure.  IMPRESSION: Ultrasound-guided biopsy of right breast mass. No apparent complications.  Electronically Signed: By: Britta Mccreedy On: 10/08/2012 16:47     LABS:    Chemistry      Component Value Date/Time   NA 143 01/12/2013 0946   NA 139 11/14/2012 1705   K 3.9 01/12/2013 0946   K 4.1 11/14/2012 1705   CL 99 11/14/2012 1705   CO2 27 01/12/2013 0946   CO2 28  11/14/2012 1705   BUN 31.9* 01/12/2013 0946   BUN 29* 11/14/2012 1705   CREATININE 1.5* 01/12/2013 0946   CREATININE 1.68* 11/14/2012 1705      Component Value Date/Time   CALCIUM 9.1 01/12/2013 0946   CALCIUM 8.4 11/14/2012 1705   ALKPHOS 47 01/12/2013 0946   ALKPHOS 51 11/11/2012 0610   AST 25 01/12/2013 0946   AST 31 11/11/2012 0610   ALT 24 01/12/2013 0946   ALT 28 11/11/2012 0610   BILITOT 0.33 01/12/2013 0946   BILITOT 0.2* 11/11/2012 0610      Lab Results  Component Value Date   WBC 5.9 01/12/2013   HGB 12.9 01/12/2013   HCT 40.2 01/12/2013   MCV 91.0 01/12/2013   PLT 124* 01/12/2013   PATHOLOGY: SpADDITIONAL INFORMATION: 1. CHROMOGENIC IN-SITU HYBRIDIZATION Results: HER-2/NEU BY CISH - NO AMPLIFICATION OF HER-2 DETECTED. RESULT RATIO OF HER2: CEP 17 SIGNALS 1.38 AVERAGE HER2 COPY NUMBER PER CELL 2.2 REFERENCE RANGE NEGATIVE HER2/Chr17 Ratio <2.0 and Average HER2 copy number <4.0 EQUIVOCAL HER2/Chr17 Ratio <2.0 and Average HER2 copy number 4.0 and <6.0 POSITIVE HER2/Chr17 Ratio >=2.0 and/or Average HER2 copy number >=6.0 Abigail Miyamoto MD Pathologist, Electronic Signature ( Signed 11/17/2012) 1. PROGNOSTIC INDICATORS - ACIS Results: IMMUNOHISTOCHEMICAL AND MORPHOMETRIC ANALYSIS BY THE AUTOMATED CELLULAR IMAGING SYSTEM (ACIS) Estrogen Receptor: 0%, NEGATIVE COMMENT: The negative hormone receptor study(ies) in this case has an internal positive control. REFERENCE RANGE ESTROGEN RECEPTOR NEGATIVE <1% POSITIVE =>1% PROGESTERONE RECEPTOR 1 of 4 FINAL for Cosby, Edwena T ([id number]) ADDITIONAL INFORMATION:(continued) NEGATIVE <1% POSITIVE =>1% All controls stained appropriately Pecola Leisure MD Pathologist, Electronic Signature ( Signed 11/13/2012) FINAL DIAGNOSIS Diagnosis 1. 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 - MICROCALCIFICATIONS WITHIN CARCINOMA. - DUCTAL CARCINOMA IN SITU, HIGH GRADE. -  RESECTION MARGINS ARE NEGATIVE FOR CARCINOMA. - SEE ONCOLOGY TABLE. 2. Lymph node, sentinel, biopsy, Right axillary #1 - ONE BENIGN LYMPH NODE (0/1). 3. Lymph node, sentinel, biopsy, Right axillary #2 - ONE BENIGN LYMPH NODE (0/1). 4. Lymph node, sentinel, biopsy, Right axillary #3 - ONE BENIGN LYMPH NODE (0/1). Microscopic Comment 1. BREAST, INVASIVE TUMOR, WITH LYMPH NODE SAMPLING Specimen, including laterality and lymph node sampling (sentinel, non-sentinel): Right breast lumpectomy and right axillary sentinel lymph nodes. Procedure: Right breast lumpectomy. Histologic type: Invasive ductal carcinoma. Grade: III Tubule formation: 3 Nuclear pleomorphism: 3 Mitotic: 2 Tumor size (gross measurement): 2.2 cm Margins: Negative. Invasive, distance to closest margin: 0.3 cm In-situ, distance to closest margin: 0.3 cm If margin positive, focally or broadly:  N/A. Lymphovascular invasion: Absent. Ductal carcinoma in situ: Present. Grade: High grade. Extensive intraductal component: Absent. Lobular neoplasia: Absent. Tumor focality: Unifocal. Treatment effect: N/A. Extent of tumor: Confined to breast. Lymph nodes: 2 of 4 FINAL for Brum, Reya T ([id number]) Microscopic Comment(continued) Examined: 3 Sentinel 0 Non-sentinel 3 Total Lymph nodes with metastasis: 0 Isolated tumor cells (< 0.2 mm): 0 Micrometastasis: (> 0.2 mm and < 2.0 mm): 0 Macrometastasis: (> 2.0 mm): 0 Extracapsular extension: N/A Breast prognostic profile: [phone]. ER and Her 2 neu will be repeated on the current specimen and reported separately. Estrogen receptor: Negative. Progesterone receptor: Negative. Her 2 neu: Not amplified. Ki-67: 56% Non-neoplastic breast: Fibrocystic change, biopsy site change. TNM: pT2 pN0(SN) Valinda Hoar MD Pathologist, Electronic Signature (Case signed 11/11/2012) cimen Gross and Clinical Information ASSESSMENT    62 year old female with  #1 new diagnosis of  stage II invasive ductal carcinoma that is ER negative PR negative HER-2/neu negative with a proliferation marker Ki-67 of 56%. Patient is status post lumpectomy of the right breast with sentinel lymph node biopsy. The final pathology did reveal a 2.2 cm grade 3 disease that was triple negative. Sentinel nodes were negative for metastatic disease.  #2 patient and I discussed her pathology. I have recommended proceeding with adjuvant chemotherapy. Due to her multiple medical problems including history of renal transplant she will receive CMF every 21 days for a total of 6 cycles. She underwent port placement by Dr. Jamey Ripa on 11/30/12.  She is currently cycle 2 day 1 of treatment.   PLAN: #1 Ms. Balbuena is doing well today.  Her CBC is stable, I reviewed it with her in detail.  She will proceed with treatment.  She has mild thrombocytopenia, for now we will monitor this.    #2  She has thrush in her mouth, I have prescribed Nystatin for this.  She was given detailed information in her AVS regarding Nystatin and how to take it.    #3 I have requested her next two treatments and nadir checks be scheduled.  She should return next week for labs and evaluation.    All questions were answered. The patient knows to call the clinic with any problems, questions or concerns. We can certainly see the patient much sooner if necessary.  I spent 25 minutes counseling the patient face to face.  The total time spent in the appointment was 30 minutes.  Illa Level, NP Medical Oncology [facility] [phone]

## 2013-01-12 NOTE — Patient Instructions (Signed)
Thrush, Adult   Thrush is a yeast infection that develops in the mouth and throat and on the tongue. The medical term for this is oropharyngeal candidiasis, or OPC. Thrush is most common in older adults, but it can occur at any age. Thrush occurs when a yeast called candida grows out of control. Candida normally is present in small amounts in the mouth and on other mucous membranes. However, under certain circumstances, candida can grow rapidly, causing thrush. Thrush can be a recurring problem for people who have chronic illnesses or who take medications that limit the body's ability to fight infection (weakened immune system). Since these people have difficulty fighting infections, the fungus that causes thrush can spread throughout the body. This can cause life-threatening blood or organ infections.  CAUSES   Candida, the yeast that causes thrush, is normally present in small amounts in the mouth and on other mucous membranes. It usually causes no harm. However, when conditions are present that allow the yeast to grow uncontrolled, it invades surrounding tissues and becomes an infection. Thrush is most commonly caused by the yeast Candida albicans. Less often, other forms of candida can lead to thrush.  There are many types of bacteria in your mouth that normally control the growth of candida. Sometimes a new type of bacteria gets into your mouth and disrupts the balance of the germs already there. This can allow candida to overgrow. Other factors that increase your risk of developing thrush include:   An impaired ability to fight infection (weakened immune system). A normal immune system is usually strong enough to prevent candida from overgrowing.   Older adults are more likely to develop thrush because they may have weaker immune systems.   People with human immunodeficiency virus (HIV) infection have a high likelihood of developing thrush. About 90% of people with HIV develop thrush at some point during  the course of their disease.   People with diabetes are more likely to get thrush because high blood sugar levels promote overgrowth of the candida fungus.   A dry mouth (xerostomia). Dry mouth can result from overuse of mouthwashes or from certain conditions such as Sjgren's syndrome.   Pregnancy. Hormone changes during pregnancy can lead to thrush by altering the balance of bacteria in the mouth.   Poor dental care, especially in people who have false teeth.   The use of antibiotic medications. This may lead to thrush by changing the balance of bacteria in the mouth.  SYMPTOMS   Thrush can be a mild infection that causes no symptoms. If symptoms develop, they may include the following:   A burning feeling in the mouth and throat. This can occur at the start of a thrush infection.   White patches that adhere to the mouth and tongue. The tissue around the patches may be red, raw, and painful. If rubbed (during tooth brushing, for example), the patches and the tissue of the mouth may bleed easily.   A bad taste in the mouth or difficulty tasting foods.   Cottony feeling in the mouth.   Sometimes pain during eating and swallowing.  DIAGNOSIS   Your caregiver can usually diagnose thrush by exam. In addition to looking in your mouth, your caregiver will ask you questions about your health.  TREATMENT   Medications that help prevent the growth of fungi (antifungals) are the standard treatment for thrush. These medications are either applied directly to the affected area (topical) or swallowed (oral).  Mild thrush  In   adults, mild cases of thrush may clear up with simple treatment that can be done at home. This treatment usually involves using an antifungal mouth rinse or lozenges. Treatment usually lasts about 14 days.  Moderate to severe thrush   More severe thrush infections that have spread to the esophagus are treated with an oral antifungal medication. A topical antifungal medication may also be  used.   For some severe infections, a treatment period longer than 14 days may be needed.   Oral antifungal medications are almost never used during pregnancy because the fetus may be harmed. However, if a pregnant woman has a rare, severe thrush infection that has spread to her blood, oral antifungal medications may be used. In this case, the risk of harm to the mother and fetus from the severe thrush infection may be greater than the risk posed by the use of antifungal medications.  Persistent or recurrent thrush  Persistent (does not go away) or recurrent (keeps coming back) cases of thrush may:   Need to be treated twice as long as the symptoms last.   Require treatment with both oral and topical antifungal medications.   People with weakened immune systems can take an antifungal medication on a continuous basis to prevent thrush infections.  It is important to treat conditions that make you more likely to get thrush, such as diabetes, human immunodeficiency virus (HIV), or cancer.   HOME CARE INSTRUCTIONS    If you are breast-feeding, you should clean your nipples with an antifungal medication, such as nystatin (Mycostatin). Dry your nipples after breast-feeding. Applying lanolin-containing body lotion may help relieve nipple soreness.   If you wear dentures and get thrush, remove dentures before going to bed, brush them vigorously, and soak in a solution of chlorhexidine gluconate or a product such as Polident or Efferdent.   Eating plain, unflavored yogurt that contains live cultures (check the label) can also help cure thrush. Yogurt helps healthy bacteria grow in the mouth. These bacteria stop the growth of the yeast that causes thrush.   Adults can treat thrush at home with gentian violet (1%), a dye that kills bacteria and fungi. It is available without a prescription. If there is no known cause for the infection or if gentian violet does not cure the thrush, you need to see your  caregiver.  Comfort measures  Measures can be taken to reduce the discomfort of thrush:   Drink cold liquids such as water or iced tea. Eat flavored ice treats or frozen juices.   Eat foods that are easy to swallow such as gelatin, ice cream, or custard.   If the patches are painful, try drinking from a straw.   Rinse your mouth several times a day with a warm saltwater rinse. You can make the saltwater mixture with 1 tsp (5 g) of salt in 8 fl oz (0.2 L) of warm water.  PROGNOSIS    Most cases of thrush are mild and clear up with the use of an antifungal mouth rinse or lozenges. Very mild cases of thrush may clear up without medical treatment. It usually takes about 14 days of treatment with an oral antifungal medication to cure more severe thrush infections. In some cases, thrush may last several weeks even with treatment.   If thrush goes untreated and does not go away by itself, it can spread to other parts of the body.   Thrush can spread to the throat, the vagina, or the skin. It rarely spreads   COMPLICATIONS Complications related to thrush are rare in healthy people. There are several factors that can increase your risk of developing thrush. Age Older adults, especially those who have serious health problems, are more likely to develop thrush because their immune systems are likely to be weaker. Behavior  The yeast that causes thrush can be spread by oral sex.  Heavy smoking can lower the body's ability to fight off infections. This makes thrush more likely to develop. Other conditions  False teeth (dentures), braces, or a retainer that irritates the mouth make it hard to keep the mouth clean. An unclean mouth is  more likely to develop thrush than a clean mouth.  People with a weakened immune system, such as those who have diabetes or human immunodeficiency virus (HIV) or who are undergoing chemotherapy, have an increased risk for developing thrush. Medications Some medications can allow the fungus that causes thrush to grow uncontrolled. Common ones are:  Antibiotics, especially those that kill a wide range of organisms (broad-spectrum antibiotics), such as tetracycline commonly can cause thrush.  Birth control pills (oral contraceptives).  Medications that weaken the body's immune system, such as corticosteroids. Environment Exposure over time to certain environmental chemicals, such as benzene and pesticides, can weaken the body's immune system. This increases your risk for developing infections, including thrush. SEEK IMMEDIATE MEDICAL CARE IF:  Your symptoms are getting worse or are not improving within 7 days of starting treatment.  You have symptoms of spreading infection, such as white patches on the skin outside of the mouth.  You are nursing and you have redness and pain in the nipples in spite of home treatment or if you have burning pain in the nipple area when you nurse. Your baby's mouth should also be examined to determine whether thrush is causing your symptoms. Document Released: 09/26/2003 Document Revised: 03/25/2011 Document Reviewed: 08/03/2012 [facility] Patient Information 2014 [place], [place].   Nystatin oral suspension What is this medicine? NYSTATIN (nye STAT in) is an antifungal medicine. It is used to treat certain kinds of fungal or yeast infections. This medicine may be used for other purposes; ask your health care provider or pharmacist if you have questions. COMMON BRAND NAME(S): Mycostatin, Nystex What should I tell my health care provider before I take this medicine? They need to know if you have any of these conditions: -diabetes -kidney disease -an unusual or  allergic reaction to nystatin, ethylenediamine, parabens, thimerosal, other foods, dyes or preservatives -pregnant or trying to get pregnant -breast-feeding How should I use this medicine? Follow the directions on the prescription label. Shake well before using. Use a specially marked dropper to measure every dose. Ask your pharmacist if you do not have one. Put one half of the dose in each side of your mouth. Swish the medicine around in your mouth and gargle. Hold your dose in your mouth for as long as you can. Swallow or spit out as directed by your doctor. Take your medicine at regular intervals. Do not take your medicine more often than directed. Do not skip doses or stop your medicine early even if you feel better. Do not stop taking except on your doctor's advice. Talk to your pediatrician regarding the use of this medicine in children. Special care may be needed. Overdosage: If you think you have taken too much of this medicine contact a poison control center or emergency room at once. NOTE: This medicine is only for you. Do not share this medicine with others. What if I  miss a dose? If you miss a dose, take it as soon as you can. If it is almost time for your next dose, take only that dose. Do not take double or extra doses. What may interact with this medicine? Interactions are not expected. This list may not describe all possible interactions. Give your health care provider a list of all the medicines, herbs, non-prescription drugs, or dietary supplements you use. Also tell them if you smoke, drink alcohol, or use illegal drugs. Some items may interact with your medicine. What should I watch for while using this medicine? Tell your doctor or health care professional if your symptoms do not improve or get worse. If you wear dentures talk to your doctor about how to clean them. What side effects may I notice from receiving this medicine? Side effects that you should report to your doctor or  health care professional as soon as possible: -allergic reactions like skin rash, itching or hives, swelling of the face, lips, or tongue -fast heart beat -redness, blistering, peeling or loosening of the skin, including inside the mouth -trouble breathing Side effects that usually do not require medical attention (report to your doctor or health care professional if they continue or are bothersome): -diarrhea -muscle aches or pains -nausea, vomiting -stomach upset This list may not describe all possible side effects. Call your doctor for medical advice about side effects. You may report side effects to FDA at 1-800-FDA-1088. Where should I keep my medicine? Keep out of the reach of children. Store at room temperature between 15 and 25 degrees C (59 and 77 degrees F). Protect from light. Throw away any unused medicine after the expiration date. NOTE: This sheet is a summary. It may not cover all possible information. If you have questions about this medicine, talk to your doctor, pharmacist, or health care provider.  2014, [employer]vier/Gold Standard. (2008-02-02 13:21:00)

## 2013-01-12 NOTE — Patient Instructions (Signed)
[facility] Discharge Instructions for Patients Receiving Chemotherapy  Today you received the following chemotherapy agents Cytoxan/Methotrexate/5 FU To help prevent nausea and vomiting after your treatment, we encourage you to take your nausea medication as prescribed.  If you develop nausea and vomiting that is not controlled by your nausea medication, call the clinic.   BELOW ARE SYMPTOMS THAT SHOULD BE REPORTED IMMEDIATELY:  *FEVER GREATER THAN 100.5 F  *CHILLS WITH OR WITHOUT FEVER  NAUSEA AND VOMITING THAT IS NOT CONTROLLED WITH YOUR NAUSEA MEDICATION  *UNUSUAL SHORTNESS OF BREATH  *UNUSUAL BRUISING OR BLEEDING  TENDERNESS IN MOUTH AND THROAT WITH OR WITHOUT PRESENCE OF ULCERS  *URINARY PROBLEMS  *BOWEL PROBLEMS  UNUSUAL RASH Items with * indicate a potential emergency and should be followed up as soon as possible.  Feel free to call the clinic you have any questions or concerns. The clinic phone number is [phone].    

## 2013-01-15 ENCOUNTER — Telehealth: Payer: Self-pay | Admitting: Oncology

## 2013-01-19 ENCOUNTER — Encounter: Payer: Self-pay | Admitting: Cardiology

## 2013-01-19 ENCOUNTER — Ambulatory Visit: Payer: Medicare Other | Admitting: Adult Health

## 2013-01-19 ENCOUNTER — Ambulatory Visit (HOSPITAL_COMMUNITY): Payer: Medicare Other | Attending: Cardiology | Admitting: Radiology

## 2013-01-19 ENCOUNTER — Other Ambulatory Visit: Payer: Medicare Other

## 2013-01-19 DIAGNOSIS — Z8673 Personal history of transient ischemic attack (TIA), and cerebral infarction without residual deficits: Secondary | ICD-10-CM | POA: Insufficient documentation

## 2013-01-19 DIAGNOSIS — I10 Essential (primary) hypertension: Secondary | ICD-10-CM | POA: Diagnosis not present

## 2013-01-19 DIAGNOSIS — I313 Pericardial effusion (noninflammatory): Secondary | ICD-10-CM

## 2013-01-19 DIAGNOSIS — Z940 Kidney transplant status: Secondary | ICD-10-CM | POA: Diagnosis not present

## 2013-01-19 DIAGNOSIS — I252 Old myocardial infarction: Secondary | ICD-10-CM | POA: Diagnosis not present

## 2013-01-19 DIAGNOSIS — I25810 Atherosclerosis of coronary artery bypass graft(s) without angina pectoris: Secondary | ICD-10-CM

## 2013-01-19 DIAGNOSIS — Z79899 Other long term (current) drug therapy: Secondary | ICD-10-CM | POA: Diagnosis not present

## 2013-01-19 DIAGNOSIS — E119 Type 2 diabetes mellitus without complications: Secondary | ICD-10-CM | POA: Insufficient documentation

## 2013-01-19 DIAGNOSIS — I3139 Other pericardial effusion (noninflammatory): Secondary | ICD-10-CM

## 2013-01-19 DIAGNOSIS — R609 Edema, unspecified: Secondary | ICD-10-CM | POA: Insufficient documentation

## 2013-01-19 DIAGNOSIS — E213 Hyperparathyroidism, unspecified: Secondary | ICD-10-CM | POA: Diagnosis not present

## 2013-01-19 NOTE — Progress Notes (Signed)
Echocardiogram performed.  

## 2013-01-20 ENCOUNTER — Other Ambulatory Visit: Payer: Medicare Other

## 2013-01-20 ENCOUNTER — Ambulatory Visit: Payer: Medicare Other | Admitting: Adult Health

## 2013-01-25 ENCOUNTER — Other Ambulatory Visit: Payer: Medicare Other

## 2013-01-25 ENCOUNTER — Telehealth: Payer: Self-pay | Admitting: Internal Medicine

## 2013-01-25 ENCOUNTER — Ambulatory Visit: Payer: Medicare Other

## 2013-01-25 NOTE — Telephone Encounter (Signed)
01/25/2013  Pt would like to have samples of insulin aspart (NOVOLOG) 100 UNIT.  Pt is in lobby.  Please advise.

## 2013-01-25 NOTE — Telephone Encounter (Signed)
01/25/2013  Pt would like samples of

## 2013-01-28 ENCOUNTER — Encounter: Payer: Self-pay | Admitting: Internal Medicine

## 2013-01-28 ENCOUNTER — Ambulatory Visit (INDEPENDENT_AMBULATORY_CARE_PROVIDER_SITE_OTHER): Payer: Medicare Other | Admitting: Internal Medicine

## 2013-01-28 VITALS — BP 120/70 | HR 76 | Temp 98.9°F | Ht 65.0 in | Wt 199.0 lb

## 2013-01-28 DIAGNOSIS — E785 Hyperlipidemia, unspecified: Secondary | ICD-10-CM

## 2013-01-28 DIAGNOSIS — E1159 Type 2 diabetes mellitus with other circulatory complications: Secondary | ICD-10-CM

## 2013-01-28 DIAGNOSIS — IMO0001 Reserved for inherently not codable concepts without codable children: Secondary | ICD-10-CM

## 2013-01-28 DIAGNOSIS — I10 Essential (primary) hypertension: Secondary | ICD-10-CM | POA: Diagnosis not present

## 2013-01-28 DIAGNOSIS — E1165 Type 2 diabetes mellitus with hyperglycemia: Secondary | ICD-10-CM

## 2013-01-28 NOTE — Progress Notes (Signed)
Pre-visit discussion using our clinic review tool. No additional management support is needed unless otherwise documented below in the visit note.  

## 2013-01-28 NOTE — Progress Notes (Signed)
Subjective:    Patient ID: Monica Lee, female    DOB: [birth year], 63 y.o.   MRN: [id number]  HPI S/p breast surgury/chemo fo recent breast cancer.  Plan to continue the cmt until may 2015, likely to be dexamethsone during this time, and breathing overall much improved incidentally.  CBG's on the steroid are elevated, check freq at home, peak CBG 303, more often 200's, no low sugars (lowest about 50 one time with less to eat). Has seen Dr Renne Crigler but had to miss a few appt due to cancer tx per pt. Has new cardiology - dr Johnsie Cancel. Past Medical History  Diagnosis Date  . Diabetes mellitus   . Acid reflux   . S/P kidney transplant   . GERD (gastroesophageal reflux disease)   . Hyperlipidemia   . DJD (degenerative joint disease)   . TIA (transient ischemic attack)   . Hx of colonic polyps   . PUD (peptic ulcer disease)   . Diabetic retinopathy   . Hemorrhoids   . Generalized headaches   . Hypertension   . Asthma   . MI (myocardial infarction) 2011  . Kidney [place]   . Mental disorder   . Stroke 2008    no deficits  . Anemia     Hx-not current  . Breast cancer   . Blind left eye   . Pneumonia   . Chronic diastolic CHF (congestive heart failure)     a. Cardiac MRI (10/2012): Moderate to severe LVH, EF 55%, chordal SAM but no LVOT gradient, mod circumferential effusion, mild LAE.  b.  Echocardiogram (11/06/12): Severe LVH, EF 22-02%, grade 1 diastolic dysfunction, moderate effusion.  Marland Kitchen Hx of cardiovascular stress test     a. Lexiscan Myoview (11/05/12): High-risk scan; fixed defect affecting the entire inferolateral and anterolateral wall; mid/apical anterior and mid/apical inferior moderate ischemia, EF 28%.  (felt to be false + as echo and cMRI with normal EF and normal WM)  . Hx of cardiac cath     a. reportedly no CAD on cardiac cath in [place]  . Wears glasses    Past Surgical History  Procedure Laterality Date  . Transplantation renal  05/2006  . Cardiac catheterization   2011  . Abdominal hysterectomy    . Cataract extraction  bil  . Intestinal perforation surgery  01/2009  . Breast biopsy  2010  . Gallstones removed    . Cholecystectomy  2008  . Colon surgery  2013    polyps removed  . Hemorrhoid surgery  01/23/2012    Procedure: HEMORRHOIDECTOMY PROLAPSED;  Surgeon: Leighton Ruff, MD;  Location: [facility];  Service: General;  Laterality: N/A;  . Eye surgery Left 1997  . Foot surgery Right 2013  . Breast lumpectomy with sentinel lymph node biopsy Right 11/09/2012    Procedure: RIGHT BREAST LUMPECTOMY WITH SENTINEL LYMPH NODE REMOVAL;  Surgeon: Haywood Lasso, MD;  Location: [place];  Service: General;  Laterality: Right;  . Portacath placement Right 11/30/2012    Procedure: INSERTION PORT-A-CATH;  Surgeon: Haywood Lasso, MD;  Location: [place];  Service: General;  Laterality: Right;    reports that she has never smoked. She has never used smokeless tobacco. She reports that she does not drink alcohol or use illicit drugs. family history includes Arthritis in her other; Diabetes in her other and other; Heart disease in her father and other; Hypertension in her other; Thyroid disease in her other. There is no history of  Kidney disease. Allergies  Allergen Reactions  . Cephalexin Swelling    Swelling to face, chills  . Propoxyphene N-Acetaminophen Hives and Itching   Current Outpatient Prescriptions on File Prior to Visit  Medication Sig Dispense Refill  . albuterol (ACCUNEB) 0.63 MG/3ML nebulizer solution USE ONE VIAL IN NEBULIZER EVERY 6 HOURS AS NEEDED FOR WHEEZING  90 mL  0  . amLODipine (NORVASC) 10 MG tablet Take 1 tablet (10 mg total) by mouth daily.  90 tablet  3  . aspirin EC 325 MG tablet Take 325 mg by mouth daily.        Marland Kitchen azaTHIOprine (IMURAN) 50 MG tablet Take 50 mg by mouth 2 (two) times daily.        . brimonidine (ALPHAGAN) 0.2 % ophthalmic solution Place 1 drop into the right eye 2 (two) times  daily.      . cinacalcet (SENSIPAR) 30 MG tablet Take 1 tablet (30 mg total) by mouth daily.  30 tablet  11  . dexamethasone (DECADRON) 4 MG tablet Take 2 tablets (8 mg total) by mouth 2 (two) times daily with a meal. Take daily starting the day after chemotherapy for 2 days. Take with food.  30 tablet  1  . docusate sodium (COLACE) 100 MG capsule Take 1 capsule (100 mg total) by mouth 2 (two) times daily.  60 capsule  0  . esomeprazole (NEXIUM) 40 MG capsule Take 40 mg by mouth daily before breakfast.      . furosemide (LASIX) 80 MG tablet Take 160 mg by mouth 3 (three) times daily.       . insulin aspart (NOVOLOG) 100 UNIT/ML injection Inject 13-16 Units into the skin 3 (three) times daily. Injects 13 units daily at breakfast, 13 units daily at lunch, and 16 units daily at supper.      . insulin glargine (LANTUS) 100 UNIT/ML injection Inject 55 Units into the skin at bedtime.      . isosorbide mononitrate (IMDUR) 60 MG 24 hr tablet Take 1 tablet (60 mg total) by mouth daily.  90 tablet  3  . lidocaine-prilocaine (EMLA) cream Apply topically as needed.  30 g  6  . LORazepam (ATIVAN) 0.5 MG tablet Take 1 tablet (0.5 mg total) by mouth every 6 (six) hours as needed (Nausea or vomiting).  30 tablet  0  . Magnesium Oxide 250 MG TABS Take 250 mg by mouth daily.       . metoprolol (LOPRESSOR) 100 MG tablet Take 1 tablet (100 mg total) by mouth 2 (two) times daily.  180 tablet  2  . minoxidil (LONITEN) 2.5 MG tablet Take 2.5 mg by mouth 2 (two) times daily.      . nitroGLYCERIN (NITROSTAT) 0.4 MG SL tablet Place 1 tablet (0.4 mg total) under the tongue every 5 (five) minutes as needed for chest pain.  25 tablet  11  . nystatin (MYCOSTATIN) 100000 UNIT/ML suspension Take 5 mLs (500,000 Units total) by mouth 4 (four) times daily.  60 mL  0  . ondansetron (ZOFRAN) 8 MG tablet Take 1 tablet (8 mg total) by mouth 2 (two) times daily. Take two times a day starting the day after chemo for 2 days. Then take two  times a day as needed for nausea or vomiting.  30 tablet  1  . polyethylene glycol (MIRALAX / GLYCOLAX) packet Take 17 g by mouth daily.  14 each  11  . potassium chloride (KLOR-CON 10) 10 MEQ tablet Take 1 tablet (  10 mEq total) by mouth 2 (two) times daily.  90 tablet  3  . pravastatin (PRAVACHOL) 20 MG tablet Take 10 mg by mouth at bedtime.        . prednisoLONE acetate (PRED FORTE) 1 % ophthalmic suspension Place 1 drop into the left eye 3 (three) times daily.      . predniSONE (DELTASONE) 10 MG tablet Take 10 mg by mouth daily.      . prochlorperazine (COMPAZINE) 10 MG tablet Take 1 tablet (10 mg total) by mouth every 6 (six) hours as needed (Nausea or vomiting).  30 tablet  1  . tacrolimus (PROGRAF) 1 MG capsule Take 9 mg by mouth 2 (two) times daily.       . timolol (TIMOPTIC) 0.25 % ophthalmic solution Place 1 drop into the right eye 2 (two) times daily.      . traMADol (ULTRAM) 50 MG tablet Take 50 mg by mouth every 6 (six) hours as needed for pain.       No current facility-administered medications on file prior to visit.    Review of Systems  Constitutional: Negative for unexpected weight change, or unusual diaphoresis  HENT: Negative for tinnitus.   Eyes: Negative for photophobia and visual disturbance.  Respiratory: Negative for choking and stridor.   Gastrointestinal: Negative for vomiting and blood in stool.  Genitourinary: Negative for hematuria and decreased urine volume.  Musculoskeletal: Negative for acute joint swelling Skin: Negative for color change and wound.  Neurological: Negative for tremors and numbness other than noted  Psychiatric/Behavioral: Negative for decreased concentration or  hyperactivity.       Objective:   Physical Exam BP 120/70  Pulse 76  Temp(Src) 98.9 F (37.2 C) (Oral)  Ht 5\' 5"  (1.651 m)  Wt 199 lb (90.266 kg)  BMI 33.12 kg/m2  SpO2 95% VS noted,  Constitutional: Pt appears well-developed and well-nourished.  HENT: Head: NCAT.  Right  Ear: External ear normal.  Left Ear: External ear normal.  Eyes: Conjunctivae and EOM are normal. Pupils are equal, round, and reactive to light.  Neck: Normal range of motion. Neck supple.  Cardiovascular: Normal rate and regular rhythm.   Pulmonary/Chest: Effort normal and breath sounds normal.  Abd:  Soft, NT, non-distended, + BS Neurological: Pt is alert. Not confused  Skin: Skin is warm. No erythema.  Psychiatric: Pt behavior is normal. Thought content normal.     Assessment & Plan:

## 2013-01-28 NOTE — Patient Instructions (Signed)
Ok to increase the Lantus by 3 units every 3 days if the average of your blood sugars if more than 180  You will be contacted regarding the referral for: Dr Renne Crigler  Please keep your appointments with your specialists as you have planned  Please return in 6 months, or sooner if needed

## 2013-01-29 ENCOUNTER — Telehealth: Payer: Self-pay

## 2013-01-29 NOTE — Telephone Encounter (Signed)
Relevant patient education mailed to patient.  

## 2013-01-30 NOTE — Assessment & Plan Note (Addendum)
stable overall by history and exam, recent data reviewed with pt, and pt to continue medical treatment as before except to titrate up the lantus asd,  to f/u any worsening symptoms or concerns, and refer back to Endo Lab Results  Component Value Date   HGBA1C 9.0* 08/28/2012

## 2013-01-30 NOTE — Assessment & Plan Note (Signed)
stable overall by history and exam, recent data reviewed with pt, and pt to continue medical treatment as before,  to f/u any worsening symptoms or concerns Lab Results  Component Value Date   LDLCALC 68 09/04/2010    

## 2013-01-30 NOTE — Assessment & Plan Note (Signed)
stable overall by history and exam, recent data reviewed with pt, and pt to continue medical treatment as before,  to f/u any worsening symptoms or concerns BP Readings from Last 3 Encounters:  01/28/13 120/70  01/12/13 118/69  01/11/13 138/78

## 2013-02-02 ENCOUNTER — Ambulatory Visit: Payer: Medicare Other | Admitting: Adult Health

## 2013-02-02 ENCOUNTER — Ambulatory Visit (HOSPITAL_BASED_OUTPATIENT_CLINIC_OR_DEPARTMENT_OTHER): Payer: Medicare Other

## 2013-02-02 ENCOUNTER — Ambulatory Visit (HOSPITAL_BASED_OUTPATIENT_CLINIC_OR_DEPARTMENT_OTHER): Payer: Medicare Other | Admitting: Oncology

## 2013-02-02 ENCOUNTER — Other Ambulatory Visit: Payer: Medicare Other

## 2013-02-02 ENCOUNTER — Other Ambulatory Visit (HOSPITAL_BASED_OUTPATIENT_CLINIC_OR_DEPARTMENT_OTHER): Payer: Medicare Other

## 2013-02-02 ENCOUNTER — Encounter: Payer: Self-pay | Admitting: Oncology

## 2013-02-02 VITALS — BP 147/72 | HR 86 | Temp 98.0°F | Resp 18 | Ht 65.0 in | Wt 199.0 lb

## 2013-02-02 DIAGNOSIS — C50519 Malignant neoplasm of lower-outer quadrant of unspecified female breast: Secondary | ICD-10-CM | POA: Diagnosis not present

## 2013-02-02 DIAGNOSIS — Z5111 Encounter for antineoplastic chemotherapy: Secondary | ICD-10-CM

## 2013-02-02 DIAGNOSIS — Z171 Estrogen receptor negative status [ER-]: Secondary | ICD-10-CM | POA: Diagnosis not present

## 2013-02-02 DIAGNOSIS — C50511 Malignant neoplasm of lower-outer quadrant of right female breast: Secondary | ICD-10-CM

## 2013-02-02 LAB — CBC WITH DIFFERENTIAL/PLATELET
BASO%: 0.2 % (ref 0.0–2.0)
Basophils Absolute: 0 10*3/uL (ref 0.0–0.1)
EOS%: 1.4 % (ref 0.0–7.0)
Eosinophils Absolute: 0.1 10*3/uL (ref 0.0–0.5)
HCT: 42.6 % (ref 34.8–46.6)
HGB: 13.4 g/dL (ref 11.6–15.9)
LYMPH%: 14.1 % (ref 14.0–49.7)
MCH: 29.5 pg (ref 25.1–34.0)
MCHC: 31.5 g/dL (ref 31.5–36.0)
MCV: 93.8 fL (ref 79.5–101.0)
MONO#: 1 10*3/uL — ABNORMAL HIGH (ref 0.1–0.9)
MONO%: 11.8 % (ref 0.0–14.0)
NEUT%: 72.5 % (ref 38.4–76.8)
NEUTROS ABS: 6 10*3/uL (ref 1.5–6.5)
Platelets: 215 10*3/uL (ref 145–400)
RBC: 4.54 10*6/uL (ref 3.70–5.45)
RDW: 17 % — AB (ref 11.2–14.5)
WBC: 8.3 10*3/uL (ref 3.9–10.3)
lymph#: 1.2 10*3/uL (ref 0.9–3.3)

## 2013-02-02 LAB — COMPREHENSIVE METABOLIC PANEL (CC13)
ALBUMIN: 3.6 g/dL (ref 3.5–5.0)
ALK PHOS: 46 U/L (ref 40–150)
ALT: 18 U/L (ref 0–55)
AST: 29 U/L (ref 5–34)
Anion Gap: 12 mEq/L — ABNORMAL HIGH (ref 3–11)
BUN: 27.6 mg/dL — ABNORMAL HIGH (ref 7.0–26.0)
CO2: 35 mEq/L — ABNORMAL HIGH (ref 22–29)
Calcium: 9.8 mg/dL (ref 8.4–10.4)
Chloride: 99 mEq/L (ref 98–109)
Creatinine: 1.7 mg/dL — ABNORMAL HIGH (ref 0.6–1.1)
Glucose: 119 mg/dl (ref 70–140)
POTASSIUM: 3.8 meq/L (ref 3.5–5.1)
Sodium: 147 mEq/L — ABNORMAL HIGH (ref 136–145)
Total Bilirubin: 0.31 mg/dL (ref 0.20–1.20)
Total Protein: 6.5 g/dL (ref 6.4–8.3)

## 2013-02-02 LAB — TECHNOLOGIST REVIEW

## 2013-02-02 MED ORDER — HEPARIN SOD (PORK) LOCK FLUSH 100 UNIT/ML IV SOLN
500.0000 [IU] | Freq: Once | INTRAVENOUS | Status: AC | PRN
  Administered 2013-02-02: 500 [IU]
  Filled 2013-02-02: qty 5

## 2013-02-02 MED ORDER — FLUOROURACIL CHEMO INJECTION 2.5 GM/50ML
600.0000 mg/m2 | Freq: Once | INTRAVENOUS | Status: AC
  Administered 2013-02-02: 1200 mg via INTRAVENOUS
  Filled 2013-02-02: qty 24

## 2013-02-02 MED ORDER — ONDANSETRON 8 MG/NS 50 ML IVPB
INTRAVENOUS | Status: AC
  Filled 2013-02-02: qty 8

## 2013-02-02 MED ORDER — ONDANSETRON 8 MG/50ML IVPB (CHCC)
8.0000 mg | Freq: Once | INTRAVENOUS | Status: AC
  Administered 2013-02-02: 8 mg via INTRAVENOUS

## 2013-02-02 MED ORDER — SODIUM CHLORIDE 0.9 % IJ SOLN
10.0000 mL | INTRAMUSCULAR | Status: DC | PRN
  Administered 2013-02-02: 10 mL
  Filled 2013-02-02: qty 10

## 2013-02-02 MED ORDER — DEXAMETHASONE SODIUM PHOSPHATE 10 MG/ML IJ SOLN
10.0000 mg | Freq: Once | INTRAMUSCULAR | Status: AC
  Administered 2013-02-02: 10 mg via INTRAVENOUS

## 2013-02-02 MED ORDER — METHOTREXATE SODIUM CHEMO INJECTION 25 MG/ML
40.0000 mg/m2 | Freq: Once | INTRAMUSCULAR | Status: AC
Start: 2013-02-02 — End: 2013-02-02
  Administered 2013-02-02: 75 mg via INTRAVENOUS
  Filled 2013-02-02: qty 3

## 2013-02-02 MED ORDER — DEXAMETHASONE SODIUM PHOSPHATE 10 MG/ML IJ SOLN
INTRAMUSCULAR | Status: AC
  Filled 2013-02-02: qty 1

## 2013-02-02 MED ORDER — SODIUM CHLORIDE 0.9 % IV SOLN
Freq: Once | INTRAVENOUS | Status: AC
  Administered 2013-02-02: 10:00:00 via INTRAVENOUS

## 2013-02-02 MED ORDER — SODIUM CHLORIDE 0.9 % IV SOLN
600.0000 mg/m2 | Freq: Once | INTRAVENOUS | Status: AC
  Administered 2013-02-02: 1220 mg via INTRAVENOUS
  Filled 2013-02-02: qty 61

## 2013-02-02 NOTE — Patient Instructions (Signed)
[facility] Discharge Instructions for Patients Receiving Chemotherapy  Today you received the following chemotherapy agents: Methotrexate, 5FU, Cytoxan  To help prevent nausea and vomiting after your treatment, we encourage you to take your nausea medication as prescribed.    If you develop nausea and vomiting that is not controlled by your nausea medication, call the clinic.   BELOW ARE SYMPTOMS THAT SHOULD BE REPORTED IMMEDIATELY:  *FEVER GREATER THAN 100.5 F  *CHILLS WITH OR WITHOUT FEVER  NAUSEA AND VOMITING THAT IS NOT CONTROLLED WITH YOUR NAUSEA MEDICATION  *UNUSUAL SHORTNESS OF BREATH  *UNUSUAL BRUISING OR BLEEDING  TENDERNESS IN MOUTH AND THROAT WITH OR WITHOUT PRESENCE OF ULCERS  *URINARY PROBLEMS  *BOWEL PROBLEMS  UNUSUAL RASH Items with * indicate a potential emergency and should be followed up as soon as possible.  Feel free to call the clinic you have any questions or concerns. The clinic phone number is [phone].    

## 2013-02-02 NOTE — Progress Notes (Signed)
Monica Lee [id number] 12/16/1950 63 y.o. 02/02/2013 8:46 AM  CC  Monica Cower, MD [place] [address] [address] Arloa [address] Dr. 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   STAGE:   Breast cancer of lower-outer quadrant of right female breast   Primary site: Breast (Right)   Staging method: AJCC 7th Edition   Clinical: Stage IIA (T2, N0, cM0)   Summary: Stage IIA (T2, N0, cM0)  REFERRING PHYSICIAN: Dr. Neldon Mc  Prior therapy:  Monica Lee is a 63 y.o. female.   #1 Who felt a mass along the inferior aspect of the right breast at about the 7:00 position. On 10/08/2012 she had a mammogram performed that showed a irregular mass with internal pleomorphic calcifications. Ultrasound showed a suspicious 2.1 x 1.3 x 1.6 cm mass in the inframammary fold. A needle core biopsy performed on the same day was diagnostic for invasive ductal carcinoma with associated DCIS. The tumor was ER negative PR negative and HER-2/neu negative. The proliferation marker Ki-67 was elevated at 50% and the tumor was grade 2.   #2 patient is status post right lumpectomy with sentinel lymph node biopsy. Her final pathology revealed 2.2 cm invasive ductal carcinoma that was grade 3 that was triple negative with a proliferation marker Ki-67 at 56%.  #3 adjuvant chemotherapy beginning December 2014 with CMF every 21 days for total of 6 cycles   Current therapy: Adjuvant CMF cycle 3 day 1  Interval history: Patient is here for evaluation prior to CMF chemotherapy.  So far she has done well with her chemotherapy. She is here for cycle 3 today. She does tell me that she feels fatigued..  She does have the sensation of carpet, and dryness in her mouth.  She denies any pain or dysphagia.  She denies fevers, chills, nausea, vomiting, constipation, diarrhea, numbness, chest pain, palpitations, DOE,  orthopnea, dysuria, or any further concerns.    Past Medical History: Past Medical History  Diagnosis Date  . Diabetes mellitus   . Acid reflux   . S/P kidney transplant   . GERD (gastroesophageal reflux disease)   . Hyperlipidemia   . DJD (degenerative joint disease)   . TIA (transient ischemic attack)   . Hx of colonic polyps   . PUD (peptic ulcer disease)   . Diabetic retinopathy   . Hemorrhoids   . Generalized headaches   . Hypertension   . Asthma   . MI (myocardial infarction) 2011  . Kidney [place]   . Mental disorder   . Stroke 2008    no deficits  . Anemia     Hx-not current  . Breast cancer   . Blind left eye   . Pneumonia   . Chronic diastolic CHF (congestive heart failure)     a. Cardiac MRI (10/2012): Moderate to severe LVH, EF 55%, chordal SAM but no LVOT gradient, mod circumferential effusion, mild LAE.  b.  Echocardiogram (11/06/12): Severe LVH, EF 53-97%, grade 1 diastolic dysfunction, moderate effusion.  Marland Kitchen Hx of cardiovascular stress test     a. Lexiscan Myoview (11/05/12): High-risk scan; fixed defect affecting the entire inferolateral and anterolateral wall; mid/apical anterior and mid/apical inferior moderate ischemia, EF 28%.  (felt to be false + as echo and cMRI with normal EF and normal WM)  . Hx of cardiac cath     a. reportedly no CAD on cardiac  cath in [place]  . Wears glasses     Past Surgical History: Past Surgical History  Procedure Laterality Date  . Transplantation renal  05/2006  . Cardiac catheterization  2011  . Abdominal hysterectomy    . Cataract extraction  bil  . Intestinal perforation surgery  01/2009  . Breast biopsy  2010  . Gallstones removed    . Cholecystectomy  2008  . Colon surgery  2013    polyps removed  . Hemorrhoid surgery  01/23/2012    Procedure: HEMORRHOIDECTOMY PROLAPSED;  Surgeon: Leighton Ruff, MD;  Location: [facility];  Service: General;  Laterality: N/A;  . Eye surgery Left 1997  . Foot surgery  Right 2013  . Breast lumpectomy with sentinel lymph node biopsy Right 11/09/2012    Procedure: RIGHT BREAST LUMPECTOMY WITH SENTINEL LYMPH NODE REMOVAL;  Surgeon: Haywood Lasso, MD;  Location: [facility];  Service: General;  Laterality: Right;  . Portacath placement Right 11/30/2012    Procedure: INSERTION PORT-A-CATH;  Surgeon: Haywood Lasso, MD;  Location: [place];  Service: General;  Laterality: Right;    Family History: Family History  Problem Relation Age of Onset  . Kidney disease Neg Hx   . Arthritis Other     parent  . Heart disease Other     parent  . Hypertension Other     parent  . Diabetes Other     parent  . Diabetes Other     grandparent  . Thyroid disease Other     several  . Heart disease Father     Social History History  Substance Use Topics  . Smoking status: Never Smoker   . Smokeless tobacco: Never Used  . Alcohol Use: No    Allergies: Allergies  Allergen Reactions  . Cephalexin Swelling    Swelling to face, chills  . Propoxyphene N-Acetaminophen Hives and Itching    Current Medications: Current Outpatient Prescriptions  Medication Sig Dispense Refill  . albuterol (ACCUNEB) 0.63 MG/3ML nebulizer solution USE ONE VIAL IN NEBULIZER EVERY 6 HOURS AS NEEDED FOR WHEEZING  90 mL  0  . amLODipine (NORVASC) 10 MG tablet Take 1 tablet (10 mg total) by mouth daily.  90 tablet  3  . aspirin EC 325 MG tablet Take 325 mg by mouth daily.        Marland Kitchen azaTHIOprine (IMURAN) 50 MG tablet Take 50 mg by mouth 2 (two) times daily.        . brimonidine (ALPHAGAN) 0.2 % ophthalmic solution Place 1 drop into the right eye 2 (two) times daily.      . cinacalcet (SENSIPAR) 30 MG tablet Take 1 tablet (30 mg total) by mouth daily.  30 tablet  11  . dexamethasone (DECADRON) 4 MG tablet Take 2 tablets (8 mg total) by mouth 2 (two) times daily with a meal. Take daily starting the day after chemotherapy for 2 days. Take with food.  30 tablet  1  . docusate  sodium (COLACE) 100 MG capsule Take 1 capsule (100 mg total) by mouth 2 (two) times daily.  60 capsule  0  . esomeprazole (NEXIUM) 40 MG capsule Take 40 mg by mouth daily before breakfast.      . furosemide (LASIX) 80 MG tablet Take 160 mg by mouth 3 (three) times daily.       . insulin aspart (NOVOLOG) 100 UNIT/ML injection Inject 13-16 Units into the skin 3 (three) times daily. Injects 13 units daily at  breakfast, 13 units daily at lunch, and 16 units daily at supper.      . insulin glargine (LANTUS) 100 UNIT/ML injection Inject 55 Units into the skin at bedtime.      . isosorbide mononitrate (IMDUR) 60 MG 24 hr tablet Take 1 tablet (60 mg total) by mouth daily.  90 tablet  3  . lidocaine-prilocaine (EMLA) cream Apply topically as needed.  30 g  6  . LORazepam (ATIVAN) 0.5 MG tablet Take 1 tablet (0.5 mg total) by mouth every 6 (six) hours as needed (Nausea or vomiting).  30 tablet  0  . Magnesium Oxide 250 MG TABS Take 250 mg by mouth daily.       . metoprolol (LOPRESSOR) 100 MG tablet Take 1 tablet (100 mg total) by mouth 2 (two) times daily.  180 tablet  2  . minoxidil (LONITEN) 2.5 MG tablet Take 2.5 mg by mouth 2 (two) times daily.      . nitroGLYCERIN (NITROSTAT) 0.4 MG SL tablet Place 1 tablet (0.4 mg total) under the tongue every 5 (five) minutes as needed for chest pain.  25 tablet  11  . nystatin (MYCOSTATIN) 100000 UNIT/ML suspension Take 5 mLs (500,000 Units total) by mouth 4 (four) times daily.  60 mL  0  . ondansetron (ZOFRAN) 8 MG tablet Take 1 tablet (8 mg total) by mouth 2 (two) times daily. Take two times a day starting the day after chemo for 2 days. Then take two times a day as needed for nausea or vomiting.  30 tablet  1  . polyethylene glycol (MIRALAX / GLYCOLAX) packet Take 17 g by mouth daily.  14 each  11  . potassium chloride (KLOR-CON 10) 10 MEQ tablet Take 1 tablet (10 mEq total) by mouth 2 (two) times daily.  90 tablet  3  . pravastatin (PRAVACHOL) 20 MG tablet Take 10  mg by mouth at bedtime.        . prednisoLONE acetate (PRED FORTE) 1 % ophthalmic suspension Place 1 drop into the left eye 3 (three) times daily.      . predniSONE (DELTASONE) 10 MG tablet Take 10 mg by mouth daily.      . prochlorperazine (COMPAZINE) 10 MG tablet Take 1 tablet (10 mg total) by mouth every 6 (six) hours as needed (Nausea or vomiting).  30 tablet  1  . tacrolimus (PROGRAF) 1 MG capsule Take 9 mg by mouth 2 (two) times daily.       . timolol (TIMOPTIC) 0.25 % ophthalmic solution Place 1 drop into the right eye 2 (two) times daily.      . traMADol (ULTRAM) 50 MG tablet Take 50 mg by mouth every 6 (six) hours as needed for pain.       No current facility-administered medications for this visit.      REVIEW OF SYSTEMS: A 10 point review of systems was conducted and is otherwise negative except for what is noted above.     PHYSICAL EXAMINATION: Blood pressure 147/72, pulse 86, temperature 98 F (36.7 C), temperature source Oral, resp. rate 18, height _0  (1.651 m), weight 199 lb (90.266 kg). General: Patient is a well appearing female in no acute distress HEENT: PERRLA, sclerae anicteric no conjunctival pallor, MMM, white patches along tongue  Neck: supple, no palpable adenopathy Lungs: clear to auscultation bilaterally, no wheezes, rhonchi, or rales Cardiovascular: regular rate rhythm, S1, S2, no murmurs, rubs or gallops Abdomen: Soft, non-tender, non-distended, normoactive bowel sounds, no HSM  Extremities: warm and well perfused, no clubbing, cyanosis, or edema Skin: No rashes or lesions Neuro: Non-focal ECOG PERFORMANCE STATUS: 1 - Symptomatic but completely ambulatory   STUDIES/RESULTS: US Breast Right  10/08/2012   CLINICAL DATA:  Palpable area of concern in the 7 o'clock region of the right breast.  EXAM: DIGITAL DIAGNOSTIC BILATERALMAMMOGRAM WITH CAD  ULTRASOUND RIGHT BREAST  COMPARISON:  WITH PRIORS  ACR Breast Density Category c: The breast tissue is  heterogeneously dense.  FINDINGS: In the deep aspect of the lower outer quadrant of the right breast is an irregular mass with internal pleomorphic calcifications.  No mass, suspicious microcalcification, or distortion is identified in the left breast to suggest malignancy.  Mammographic images were processed with CAD.  On physical exam there is a firm approximately 2.5 cm mass in the 7 o'clock position right breast approximately 9 cm from the nipple near the inframammary fold. No additional mass is palpated in the right breast. No axillary lymphadenopathy is palpated on the right.  Ultrasound is performed, showing an irregular heterogeneous and hypoechoic mass measuring approximately 2.1 x 1.3 x 1.6 cm. There is internal vascular flow. Small echogenicities within the mass are consistent with the suspicious calcifications seen on mammography.  Ultrasound the right axilla demonstrates a normal axillary lymph node. No lymphadenopathy is detected.  IMPRESSION: Suspicious 2.1 x 1.3 x 1.6 cm palpable mass 10 o'clock position right breast near the inframammary fold. Findings are suggestive of malignancy and ultrasound-guided biopsy is recommended and will be performed today and dictated separately.  RECOMMENDATION: Ultrasound-guided biopsy of a right breast mass.  I have discussed the findings and recommendations with the patient. Results were also provided in writing at the conclusion of the visit. If applicable, a reminder letter will be sent to the patient regarding the next appointment.  BI-RADS CATEGORY  5: Highly suggestive of malignancy - appropriate action should be taken.   Electronically Signed   By: Curlene Dolphin   On: 10/08/2012 16:45   Mm Digital Diagnostic Bilat  10/08/2012   CLINICAL DATA:  Palpable area of concern in the 7 o'clock region of the right breast.  EXAM: DIGITAL DIAGNOSTIC BILATERALMAMMOGRAM WITH CAD  ULTRASOUND RIGHT BREAST  COMPARISON:  WITH PRIORS  ACR Breast Density Category c: The breast  tissue is heterogeneously dense.  FINDINGS: In the deep aspect of the lower outer quadrant of the right breast is an irregular mass with internal pleomorphic calcifications.  No mass, suspicious microcalcification, or distortion is identified in the left breast to suggest malignancy.  Mammographic images were processed with CAD.  On physical exam there is a firm approximately 2.5 cm mass in the 7 o'clock position right breast approximately 9 cm from the nipple near the inframammary fold. No additional mass is palpated in the right breast. No axillary lymphadenopathy is palpated on the right.  Ultrasound is performed, showing an irregular heterogeneous and hypoechoic mass measuring approximately 2.1 x 1.3 x 1.6 cm. There is internal vascular flow. Small echogenicities within the mass are consistent with the suspicious calcifications seen on mammography.  Ultrasound the right axilla demonstrates a normal axillary lymph node. No lymphadenopathy is detected.  IMPRESSION: Suspicious 2.1 x 1.3 x 1.6 cm palpable mass 10 o'clock position right breast near the inframammary fold. Findings are suggestive of malignancy and ultrasound-guided biopsy is recommended and will be performed today and dictated separately.  RECOMMENDATION: Ultrasound-guided biopsy of a right breast mass.  I have discussed the findings and recommendations with the patient. Results  were also provided in writing at the conclusion of the visit. If applicable, a reminder letter will be sent to the patient regarding the next appointment.  BI-RADS CATEGORY  5: Highly suggestive of malignancy - appropriate action should be taken.   Electronically Signed   By: Curlene Dolphin   On: 10/08/2012 16:45   Mm Digital Diagnostic Unilat R  10/08/2012   CLINICAL DATA:  Ultrasound-guided biopsy of right breast mass was performed.  EXAM: DIGITAL DIAGNOSTIC UNILATERAL RIGHT MAMMOGRAM  COMPARISON:  Previous exams.  FINDINGS: Films are performed following ultrasound guided  biopsy of probable some suspicious right breast mass at 7 o'clock position near the inframammary fold. A dumbbell shaped biopsy clip is in satisfactory position within the mass.  IMPRESSION: Satisfactory position of biopsy clip in the right breast mass.  Final Assessment: Post Procedure Mammograms for Marker Placement   Electronically Signed   By: Curlene Dolphin   On: 10/08/2012 16:48   Mr Card Morphology W/o Cm  10/29/2012   CLINICAL DATA:  Cardiomyopathy:  Assess EF  EXAM: CARDIAC MRI  TECHNIQUE: The patient was scanned on a 1.5 Tesla GE magnet. A dedicated cardiac coil was used. Functional imaging was done using Fiesta sequences. 2,3, and 4 chamber views were done to assess for RWMA's. Modified Simpson's rule using a short axis stack was used to calculate an ejection fraction on a dedicated work Conservation officer, nature. No multihance was used due to the patient previous renal transplant.  CONTRAST:  None  FINDINGS: There was moderate to severel LVH with septal thickness 26m. There was a moderate circumferential pericardial effusion. There was mild LAE Right sided cardiac chambers were normal with no diastolic flattening. The aortic valve had some turbulence to flow in systole There was no significant MR or AR. The LV cavity size was small with prominent papillary muscles and a narrow LVOT. There was chordal SAM with no evidence of sub valvular obstruction.  There were no discrete RWMA;s Quantitative EF was 55%(EDV 130 cc ESV 59 cc and SV 71cc) Global longitudinal strain was normal at 1 21.5%  IMPRESSION: 1) Moderate to severe LVH with normal EF 55% Small LV cavity size with chordal SAM but o LVOT gradient or true HOCM  2) Normal longitudinal strain -21.5%  3) Moderate circumferential pericardial effusion  4) Normal RV/RA  5) Mild LAE  No gadolinium given so no infarct imaging done due to patients history of renal transplant  PJenkins Rouge  Electronically Signed   By: PJenkins RougeM.D.   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 This is concordant. The patient was notified of results the afternoon of 10/09/2012 by Dr. JGlennon Mac She has no complaints regarding her biopsy site. She is scheduled for multidisciplinary clinic on 10/14/2012. Due to the patient's renal transplant, no breast MRI is scheduled.   Electronically Signed   By: MDonavan BurnetM.D.   On: 10/09/2012 16:46   10/09/2012   CLINICAL DATA:  Suspicious palpable mass 10 o'clock position right breast.  EXAM: UKoreaBREAST BX W LOC DEV 1ST LESION IMG BX SPEC UKoreaGUIDE*R*  COMPARISON:  Previous exams.  PROCEDURE: I met with the patient and we discussed the procedure of ultrasound-guided biopsy, including benefits and alternatives. We discussed the high likelihood of a successful procedure.  We discussed the risks of the procedure including infection, bleeding, tissue injury, clip migration, and inadequate sampling. Informed written consent was given.  Using sterile technique and 2% Lidocaine as local anesthetic, under direct ultrasound visualization, a 12 gauge vacuum-assisteddevice was used to perform biopsy of an approximate 2.1 cm solid mass at the 7 o'clock position 9 cm from the nipple using a lateral to medial approach. At the conclusion of the procedure, a dumbbell-shaped tissue marker clip was deployed into the biopsy cavity. Follow-up 2-view mammogram was performed and dictated separately.  The usual time-out protocol was performed immediately prior to the procedure.  IMPRESSION: Ultrasound-guided biopsy of right breast mass. No apparent complications.  Electronically Signed: By: Curlene Dolphin On: 10/08/2012 16:47     LABS:    Chemistry      Component Value Date/Time   NA 143 01/12/2013 0946   NA 139 11/14/2012 1705   K 3.9 01/12/2013 0946   K 4.1 11/14/2012 1705    CL 99 11/14/2012 1705   CO2 27 01/12/2013 0946   CO2 28 11/14/2012 1705   BUN 31.9* 01/12/2013 0946   BUN 29* 11/14/2012 1705   CREATININE 1.5* 01/12/2013 0946   CREATININE 1.68* 11/14/2012 1705      Component Value Date/Time   CALCIUM 9.1 01/12/2013 0946   CALCIUM 8.4 11/14/2012 1705   ALKPHOS 47 01/12/2013 0946   ALKPHOS 51 11/11/2012 0610   AST 25 01/12/2013 0946   AST 31 11/11/2012 0610   ALT 24 01/12/2013 0946   ALT 28 11/11/2012 0610   BILITOT 0.33 01/12/2013 0946   BILITOT 0.2* 11/11/2012 0610      Lab Results  Component Value Date   WBC 8.3 02/02/2013   HGB 13.4 02/02/2013   HCT 42.6 02/02/2013   MCV 93.8 02/02/2013   PLT 215 02/02/2013   PATHOLOGY: SpADDITIONAL INFORMATION: 1. CHROMOGENIC IN-SITU HYBRIDIZATION Results: HER-2/NEU BY CISH - NO AMPLIFICATION OF HER-2 DETECTED. RESULT RATIO OF HER2: CEP 17 SIGNALS 1.38 AVERAGE HER2 COPY NUMBER PER CELL 2.2 REFERENCE RANGE NEGATIVE HER2/Chr17 Ratio <2.0 and Average HER2 copy number <4.0 EQUIVOCAL HER2/Chr17 Ratio <2.0 and Average HER2 copy number 4.0 and <6.0 POSITIVE HER2/Chr17 Ratio >=2.0 and/or Average HER2 copy number >=6.0 Aldona Bar MD Pathologist, Electronic Signature ( Signed 11/17/2012) 1. PROGNOSTIC INDICATORS - ACIS Results: IMMUNOHISTOCHEMICAL AND MORPHOMETRIC ANALYSIS BY THE AUTOMATED CELLULAR IMAGING SYSTEM (ACIS) Estrogen Receptor: 0%, NEGATIVE COMMENT: The negative hormone receptor study(ies) in this case has an internal positive control. REFERENCE RANGE ESTROGEN RECEPTOR NEGATIVE <1% POSITIVE =>1% PROGESTERONE RECEPTOR 1 of 4 FINAL for Standlee, Darcy T ([id number]) ADDITIONAL INFORMATION:(continued) NEGATIVE <1% POSITIVE =>1% All controls stained appropriately Enid Cutter MD Pathologist, Electronic Signature ( Signed 11/13/2012) FINAL DIAGNOSIS Diagnosis 1. 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 - MICROCALCIFICATIONS WITHIN  CARCINOMA. - DUCTAL CARCINOMA IN SITU, HIGH GRADE. - RESECTION MARGINS ARE NEGATIVE FOR CARCINOMA. - SEE ONCOLOGY TABLE. 2. Lymph node, sentinel, biopsy, Right axillary #1 - ONE BENIGN LYMPH NODE (0/1). 3. Lymph node, sentinel, biopsy, Right axillary #2 - ONE BENIGN LYMPH NODE (0/1). 4. Lymph node, sentinel, biopsy, Right axillary #3 - ONE BENIGN LYMPH NODE (0/1). Microscopic Comment 1. BREAST, INVASIVE TUMOR, WITH LYMPH NODE SAMPLING Specimen, including laterality and lymph node sampling (sentinel, non-sentinel): Right breast lumpectomy and right axillary sentinel lymph nodes. Procedure: Right breast lumpectomy. Histologic type: Invasive ductal carcinoma. Grade: III Tubule formation: 3 Nuclear pleomorphism: 3 Mitotic: 2 Tumor size (gross measurement): 2.2  cm Margins: Negative. Invasive, distance to closest margin: 0.3 cm In-situ, distance to closest margin: 0.3 cm If margin positive, focally or broadly: N/A. Lymphovascular invasion: Absent. Ductal carcinoma in situ: Present. Grade: High grade. Extensive intraductal component: Absent. Lobular neoplasia: Absent. Tumor focality: Unifocal. Treatment effect: N/A. Extent of tumor: Confined to breast. Lymph nodes: 2 of 4 FINAL for Mccullum, Ailani T ([id number]) Microscopic Comment(continued) Examined: 3 Sentinel 0 Non-sentinel 3 Total Lymph nodes with metastasis: 0 Isolated tumor cells (< 0.2 mm): 0 Micrometastasis: (> 0.2 mm and < 2.0 mm): 0 Macrometastasis: (> 2.0 mm): 0 Extracapsular extension: N/A Breast prognostic profile: [phone]. ER and Her 2 neu will be repeated on the current specimen and reported separately. Estrogen receptor: Negative. Progesterone receptor: Negative. Her 2 neu: Not amplified. Ki-67: 56% Non-neoplastic breast: Fibrocystic change, biopsy site change. TNM: pT2 pN0(SN) Vicente Males MD Pathologist, Electronic Signature (Case signed 11/11/2012) cimen Gross and Clinical  Information ASSESSMENT    63 year old female with  #1 new diagnosis of stage II invasive ductal carcinoma that is ER negative PR negative HER-2/neu negative with a proliferation marker Ki-67 of 56%. Patient is status post lumpectomy of the right breast with sentinel lymph node biopsy. The final pathology did reveal a 2.2 cm grade 3 disease that was triple negative. Sentinel nodes were negative for metastatic disease.  #2 patient is currently receiving adjuvant chemotherapy consisting of CMF every 21 days. She began her initial therapy on 12/15/2012. Total of 6 cycles of therapy is planned. Overall she is tolerating it well without any significant complaints.  #3 diabetes: Remains stable. She is following up with her primary care physician on a regular basis.  PLAN: #1 proceed with cycle 3 day 1 of CMF. Bloodwork looks terrific. She is no longer thrombocytopenic.  #2 patient will be seen back in one week's time for followup and interim labs  All questions were answered. The patient knows to call the clinic with any problems, questions or concerns. We can certainly see the patient much sooner if necessary.  I spent 25 minutes counseling the patient face to face.  The total time spent in the appointment was 30 minutes.  Marcy Panning, MD Medical/Oncology [facility] [phone] (beeper) [phone] (Office)  02/02/2013, 8:55 AM

## 2013-02-02 NOTE — Progress Notes (Signed)
Blood return noted before, during, and after methotrexate infusion.

## 2013-02-09 ENCOUNTER — Ambulatory Visit (HOSPITAL_BASED_OUTPATIENT_CLINIC_OR_DEPARTMENT_OTHER): Payer: Medicare Other | Admitting: Oncology

## 2013-02-09 ENCOUNTER — Encounter: Payer: Self-pay | Admitting: Oncology

## 2013-02-09 ENCOUNTER — Encounter (INDEPENDENT_AMBULATORY_CARE_PROVIDER_SITE_OTHER): Payer: Self-pay

## 2013-02-09 ENCOUNTER — Other Ambulatory Visit: Payer: Self-pay | Admitting: Oncology

## 2013-02-09 ENCOUNTER — Ambulatory Visit (HOSPITAL_COMMUNITY)
Admission: RE | Admit: 2013-02-09 | Discharge: 2013-02-09 | Disposition: A | Discharge status: Home / Self Care | Payer: Medicare Other | Source: Ambulatory Visit | Attending: Oncology | Admitting: Oncology

## 2013-02-09 ENCOUNTER — Other Ambulatory Visit (HOSPITAL_BASED_OUTPATIENT_CLINIC_OR_DEPARTMENT_OTHER): Payer: Medicare Other

## 2013-02-09 ENCOUNTER — Telehealth: Payer: Self-pay | Admitting: Oncology

## 2013-02-09 VITALS — BP 132/71 | HR 69 | Temp 98.8°F | Resp 18 | Ht 65.0 in | Wt 193.3 lb

## 2013-02-09 DIAGNOSIS — M79605 Pain in left leg: Secondary | ICD-10-CM

## 2013-02-09 DIAGNOSIS — M79609 Pain in unspecified limb: Secondary | ICD-10-CM | POA: Diagnosis not present

## 2013-02-09 DIAGNOSIS — R609 Edema, unspecified: Secondary | ICD-10-CM | POA: Diagnosis not present

## 2013-02-09 DIAGNOSIS — E119 Type 2 diabetes mellitus without complications: Secondary | ICD-10-CM | POA: Diagnosis not present

## 2013-02-09 DIAGNOSIS — C50511 Malignant neoplasm of lower-outer quadrant of right female breast: Secondary | ICD-10-CM

## 2013-02-09 DIAGNOSIS — S79929A Unspecified injury of unspecified thigh, initial encounter: Secondary | ICD-10-CM | POA: Diagnosis not present

## 2013-02-09 DIAGNOSIS — C50519 Malignant neoplasm of lower-outer quadrant of unspecified female breast: Secondary | ICD-10-CM | POA: Diagnosis not present

## 2013-02-09 DIAGNOSIS — M25559 Pain in unspecified hip: Secondary | ICD-10-CM | POA: Insufficient documentation

## 2013-02-09 DIAGNOSIS — W19XXXA Unspecified fall, initial encounter: Secondary | ICD-10-CM | POA: Insufficient documentation

## 2013-02-09 DIAGNOSIS — R234 Changes in skin texture: Secondary | ICD-10-CM | POA: Insufficient documentation

## 2013-02-09 DIAGNOSIS — I70209 Unspecified atherosclerosis of native arteries of extremities, unspecified extremity: Secondary | ICD-10-CM | POA: Insufficient documentation

## 2013-02-09 DIAGNOSIS — S79919A Unspecified injury of unspecified hip, initial encounter: Secondary | ICD-10-CM | POA: Diagnosis not present

## 2013-02-09 LAB — CBC WITH DIFFERENTIAL/PLATELET
BASO%: 0.6 % (ref 0.0–2.0)
Basophils Absolute: 0 10*3/uL (ref 0.0–0.1)
EOS%: 0.1 % (ref 0.0–7.0)
Eosinophils Absolute: 0 10*3/uL (ref 0.0–0.5)
HEMATOCRIT: 35.1 % (ref 34.8–46.6)
HGB: 11.5 g/dL — ABNORMAL LOW (ref 11.6–15.9)
LYMPH#: 0.3 10*3/uL — AB (ref 0.9–3.3)
LYMPH%: 5.9 % — ABNORMAL LOW (ref 14.0–49.7)
MCH: 30.1 pg (ref 25.1–34.0)
MCHC: 32.7 g/dL (ref 31.5–36.0)
MCV: 92 fL (ref 79.5–101.0)
MONO#: 0.3 10*3/uL (ref 0.1–0.9)
MONO%: 5.9 % (ref 0.0–14.0)
NEUT#: 4.2 10*3/uL (ref 1.5–6.5)
NEUT%: 87.5 % — AB (ref 38.4–76.8)
Platelets: 99 10*3/uL — ABNORMAL LOW (ref 145–400)
RBC: 3.81 10*6/uL (ref 3.70–5.45)
RDW: 17.9 % — ABNORMAL HIGH (ref 11.2–14.5)
WBC: 4.8 10*3/uL (ref 3.9–10.3)

## 2013-02-09 LAB — COMPREHENSIVE METABOLIC PANEL (CC13)
ALBUMIN: 3 g/dL — AB (ref 3.5–5.0)
ALT: 19 U/L (ref 0–55)
ANION GAP: 10 meq/L (ref 3–11)
AST: 39 U/L — ABNORMAL HIGH (ref 5–34)
Alkaline Phosphatase: 64 U/L (ref 40–150)
BUN: 24.7 mg/dL (ref 7.0–26.0)
CALCIUM: 9 mg/dL (ref 8.4–10.4)
CHLORIDE: 93 meq/L — AB (ref 98–109)
CO2: 31 meq/L — AB (ref 22–29)
Creatinine: 1.5 mg/dL — ABNORMAL HIGH (ref 0.6–1.1)
Glucose: 257 mg/dl — ABNORMAL HIGH (ref 70–140)
POTASSIUM: 4.2 meq/L (ref 3.5–5.1)
Sodium: 134 mEq/L — ABNORMAL LOW (ref 136–145)
Total Bilirubin: 1.39 mg/dL — ABNORMAL HIGH (ref 0.20–1.20)
Total Protein: 6.6 g/dL (ref 6.4–8.3)

## 2013-02-09 LAB — TECHNOLOGIST REVIEW: Technologist Review: 2

## 2013-02-09 NOTE — Telephone Encounter (Signed)
, °

## 2013-02-09 NOTE — Progress Notes (Signed)
OFFICE PROGRESS NOTE  CC**  Monica Cower, MD [place] [address] Dr. Arloa Koh  Dr. 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   STAGE:  Breast cancer of lower-outer quadrant of right female breast  Primary site: Breast (Right)  Staging method: AJCC 7th Edition  Clinical: Stage IIA (T2, N0, cM0)  Summary: Stage IIA (T2, N0, cM0)  PRIOR THERAPY:  #1 Who felt a mass along the inferior aspect of the right breast at about the 7:00 position. On 10/08/2012 she had a mammogram performed that showed a irregular mass with internal pleomorphic calcifications. Ultrasound showed a suspicious 2.1 x 1.3 x 1.6 cm mass in the inframammary fold. A needle core biopsy performed on the same day was diagnostic for invasive ductal carcinoma with associated DCIS. The tumor was ER negative PR negative and HER-2/neu negative. The proliferation marker Ki-67 was elevated at 50% and the tumor was grade 2.   #2 patient is status post right lumpectomy with sentinel lymph node biopsy. Her final pathology revealed 2.2 cm invasive ductal carcinoma that was grade 3 that was triple negative with a proliferation marker Ki-67 at 56%.   #3 Curative intent adjuvant chemotherapy beginning December 2014 with CMF every 21 days for total of 6 cycles    CURRENT THERAPY: Adjuvant CMF cycle 3 day 8   INTERVAL HISTORY: [name] 63 y.o. female returns for followup visit. She has now completed 3 cycles of chemotherapy. Overall she is tolerating this quite nicely. Her last cycle was on 02/02/2013. Today she is thrombocytopenic with a platelet count of 99,000. Patient tells me that she is having considerable pain in her left thigh near the pelvic area. She apparently had a fall about 2 or 3 weeks ago. She is unclear as to how she fell. But ever since then she is having increasing difficulty with the left leg which is  not able to walk. On my examination she is noted to have a large hematoma which is warm to touch. She denies having any weakness in the lower extremity she denies any peripheral paresthesias she denies any changes in her bowel or bladder habits no lower back pain. She has no headaches double vision blurring of vision no fevers chills or night sweats. She has no other hematuria hematochezia or melena hemoptysis or hematemesis. Remainder of the 10 point review of systems is negative  MEDICAL HISTORY: Past Medical History  Diagnosis Date  . Diabetes mellitus   . Acid reflux   . S/P kidney transplant   . GERD (gastroesophageal reflux disease)   . Hyperlipidemia   . DJD (degenerative joint disease)   . TIA (transient ischemic attack)   . Hx of colonic polyps   . PUD (peptic ulcer disease)   . Diabetic retinopathy   . Hemorrhoids   . Generalized headaches   . Hypertension   . Asthma   . MI (myocardial infarction) 2011  . Kidney [place]   . Mental disorder   . Stroke 2008    no deficits  . Anemia     Hx-not current  . Breast cancer   . Blind left eye   . Pneumonia   . Chronic diastolic CHF (congestive heart failure)     a. Cardiac MRI (10/2012): Moderate to severe LVH, EF 55%, chordal SAM but no LVOT gradient, mod circumferential effusion, mild LAE.  b.  Echocardiogram (11/06/12): Severe  LVH, EF 83-72%, grade 1 diastolic dysfunction, moderate effusion.  Marland Kitchen Hx of cardiovascular stress test     a. Lexiscan Myoview (11/05/12): High-risk scan; fixed defect affecting the entire inferolateral and anterolateral wall; mid/apical anterior and mid/apical inferior moderate ischemia, EF 28%.  (felt to be false + as echo and cMRI with normal EF and normal WM)  . Hx of cardiac cath     a. reportedly no CAD on cardiac cath in [place]  . Wears glasses     ALLERGIES:  is allergic to cephalexin and propoxyphene n-acetaminophen.  MEDICATIONS:  Current Outpatient Prescriptions  Medication Sig Dispense  Refill  . albuterol (ACCUNEB) 0.63 MG/3ML nebulizer solution USE ONE VIAL IN NEBULIZER EVERY 6 HOURS AS NEEDED FOR WHEEZING  90 mL  0  . amLODipine (NORVASC) 10 MG tablet Take 1 tablet (10 mg total) by mouth daily.  90 tablet  3  . aspirin EC 325 MG tablet Take 325 mg by mouth daily.        Marland Kitchen azaTHIOprine (IMURAN) 50 MG tablet Take 50 mg by mouth 2 (two) times daily.        . brimonidine (ALPHAGAN) 0.2 % ophthalmic solution Place 1 drop into the right eye 2 (two) times daily.      . cinacalcet (SENSIPAR) 30 MG tablet Take 1 tablet (30 mg total) by mouth daily.  30 tablet  11  . dexamethasone (DECADRON) 4 MG tablet Take 2 tablets (8 mg total) by mouth 2 (two) times daily with a meal. Take daily starting the day after chemotherapy for 2 days. Take with food.  30 tablet  1  . docusate sodium (COLACE) 100 MG capsule Take 1 capsule (100 mg total) by mouth 2 (two) times daily.  60 capsule  0  . esomeprazole (NEXIUM) 40 MG capsule Take 40 mg by mouth daily before breakfast.      . furosemide (LASIX) 80 MG tablet Take 160 mg by mouth 3 (three) times daily.       . insulin aspart (NOVOLOG) 100 UNIT/ML injection Inject 13-16 Units into the skin 3 (three) times daily. Injects 13 units daily at breakfast, 13 units daily at lunch, and 16 units daily at supper.      . insulin glargine (LANTUS) 100 UNIT/ML injection Inject 55 Units into the skin at bedtime.      . isosorbide mononitrate (IMDUR) 60 MG 24 hr tablet Take 1 tablet (60 mg total) by mouth daily.  90 tablet  3  . lidocaine-prilocaine (EMLA) cream Apply topically as needed.  30 g  6  . LORazepam (ATIVAN) 0.5 MG tablet Take 1 tablet (0.5 mg total) by mouth every 6 (six) hours as needed (Nausea or vomiting).  30 tablet  0  . Magnesium Oxide 250 MG TABS Take 250 mg by mouth daily.       . metoprolol (LOPRESSOR) 100 MG tablet Take 1 tablet (100 mg total) by mouth 2 (two) times daily.  180 tablet  2  . minoxidil (LONITEN) 2.5 MG tablet Take 2.5 mg by mouth 2  (two) times daily.      Marland Kitchen nystatin (MYCOSTATIN) 100000 UNIT/ML suspension Take 5 mLs (500,000 Units total) by mouth 4 (four) times daily.  60 mL  0  . ondansetron (ZOFRAN) 8 MG tablet Take 1 tablet (8 mg total) by mouth 2 (two) times daily. Take two times a day starting the day after chemo for 2 days. Then take two times a day as needed for nausea or  vomiting.  30 tablet  1  . polyethylene glycol (MIRALAX / GLYCOLAX) packet Take 17 g by mouth daily.  14 each  11  . potassium chloride (KLOR-CON 10) 10 MEQ tablet Take 1 tablet (10 mEq total) by mouth 2 (two) times daily.  90 tablet  3  . pravastatin (PRAVACHOL) 20 MG tablet Take 10 mg by mouth at bedtime.        . prednisoLONE acetate (PRED FORTE) 1 % ophthalmic suspension Place 1 drop into the left eye 3 (three) times daily.      . predniSONE (DELTASONE) 10 MG tablet Take 10 mg by mouth daily.      . prochlorperazine (COMPAZINE) 10 MG tablet Take 1 tablet (10 mg total) by mouth every 6 (six) hours as needed (Nausea or vomiting).  30 tablet  1  . tacrolimus (PROGRAF) 1 MG capsule Take 9 mg by mouth 2 (two) times daily.       . timolol (TIMOPTIC) 0.25 % ophthalmic solution Place 1 drop into the right eye 2 (two) times daily.      . traMADol (ULTRAM) 50 MG tablet Take 50 mg by mouth every 6 (six) hours as needed for pain.      . nitroGLYCERIN (NITROSTAT) 0.4 MG SL tablet Place 1 tablet (0.4 mg total) under the tongue every 5 (five) minutes as needed for chest pain.  25 tablet  11   No current facility-administered medications for this visit.    SURGICAL HISTORY:  Past Surgical History  Procedure Laterality Date  . Transplantation renal  05/2006  . Cardiac catheterization  2011  . Abdominal hysterectomy    . Cataract extraction  bil  . Intestinal perforation surgery  01/2009  . Breast biopsy  2010  . Gallstones removed    . Cholecystectomy  2008  . Colon surgery  2013    polyps removed  . Hemorrhoid surgery  01/23/2012    Procedure:  HEMORRHOIDECTOMY PROLAPSED;  Surgeon: Leighton Ruff, MD;  Location: [facility];  Service: General;  Laterality: N/A;  . Eye surgery Left 1997  . Foot surgery Right 2013  . Breast lumpectomy with sentinel lymph node biopsy Right 11/09/2012    Procedure: RIGHT BREAST LUMPECTOMY WITH SENTINEL LYMPH NODE REMOVAL;  Surgeon: Haywood Lasso, MD;  Location: [facility];  Service: General;  Laterality: Right;  . Portacath placement Right 11/30/2012    Procedure: INSERTION PORT-A-CATH;  Surgeon: Haywood Lasso, MD;  Location: [facility];  Service: General;  Laterality: Right;    REVIEW OF SYSTEMS:  Pertinent items are noted in HPI.    PHYSICAL EXAMINATION: Blood pressure 132/71, pulse 69, temperature 98.8 F (37.1 C), temperature source Oral, resp. rate 18, height 5' 5" (1.651 m), weight 193 lb 4.8 oz (87.68 kg). Body mass index is 32.17 kg/(m^2). ECOG PERFORMANCE STATUS: 0 - Asymptomatic   General appearance: alert, cooperative and appears older than stated age Lymph nodes: Cervical, supraclavicular, and axillary nodes normal. Resp: clear to auscultation bilaterally Back: symmetric, no curvature. ROM normal. No CVA tenderness. Cardio: regular rate and rhythm GI: soft, non-tender; bowel sounds normal; no masses,  no organomegaly Extremities: Right lower extremity anterior thigh palpable hematoma measuring approximately 10 cm warm to touch slightly tender no swelling in the lower calf her toes. Neurologic: Grossly normal   LABORATORY DATA: Lab Results  Component Value Date   WBC 4.8 02/09/2013   HGB 11.5* 02/09/2013   HCT 35.1 02/09/2013   MCV 92.0 02/09/2013  PLT 99* 02/09/2013      Chemistry      Component Value Date/Time   NA 134* 02/09/2013 1014   NA 139 11/14/2012 1705   K 4.2 02/09/2013 1014   K 4.1 11/14/2012 1705   CL 99 11/14/2012 1705   CO2 31* 02/09/2013 1014   CO2 28 11/14/2012 1705   BUN 24.7 02/09/2013 1014   BUN 29* 11/14/2012 1705   CREATININE  1.5* 02/09/2013 1014   CREATININE 1.68* 11/14/2012 1705      Component Value Date/Time   CALCIUM 9.0 02/09/2013 1014   CALCIUM 8.4 11/14/2012 1705   ALKPHOS 64 02/09/2013 1014   ALKPHOS 51 11/11/2012 0610   AST 39* 02/09/2013 1014   AST 31 11/11/2012 0610   ALT 19 02/09/2013 1014   ALT 28 11/11/2012 0610   BILITOT 1.39* 02/09/2013 1014   BILITOT 0.2* 11/11/2012 0610       RADIOGRAPHIC STUDIES:  Ct Hip Left Wo Contrast  02/09/2013   CLINICAL DATA:  Pt with fall and left upper leg and hip pain evaluate for fracture  EXAM: CT OF THE LEFT HIP WITHOUT CONTRAST  TECHNIQUE: Multidetector CT imaging was performed according to the standard protocol. Multiplanar CT image reconstructions were also generated.  COMPARISON:  None.  FINDINGS: There is no left hip fracture or dislocation. The left superior and inferior pubic rami are intact. There is soft tissue edema within the subcutaneous fat overlying the left proximal femur with skin thickening which may be secondary to direct trauma versus cellulitis.  There is no focal fluid collection. There is no hematoma. There is no soft tissue emphysema.  There is no lytic or blastic osseous lesion.  There is peripheral vascular atherosclerotic disease.  IMPRESSION: 1. No acute osseous injury of the left hip. 2. Soft tissue edema within the subcutaneous fat overlying the left proximal femur with skin thickening which may be secondary to direct trauma versus cellulitis.   Electronically Signed   By: Kathreen Devoid   On: 02/09/2013 12:44    ASSESSMENT/PLAN: 63 year old female with  #1 stage II invasive ductal carcinoma ER negative PR negative HER-2/neu negative (triple-negative) with a proliferation marker Ki-67 56%. Patient status post lumpectomy of the right breast with sentinel lymph node biopsy. The final pathology did reveal a grade 3 invasive ductal carcinoma triple negative measuring 2.2 cm. Sentinel nodes were negative for metastatic disease.  #2 patient  currently receiving adjuvant curative intent chemotherapy consisting of CMF every 21 day cycle starting December 2014. Thus far she has done well. She has now completed 3 cycles out of the 6 planned cycles. Course complicated by development of thrombocytopenia with the present cycle. She will return in 2 weeks' time for cycle 4 of CMF.  #3 diabetes: Overall patient remains stable she is followed with her primary care physician on a regular basis.  #4 left thigh hematoma from a recent fall: I have recommended getting CT of the pelvis and upper leg for further evaluation. If need arises she will also be sent to the emergency room. She will continue pain medications.  #5 patient will be seen back in 2 weeks' time for followup and cycle 4 of CMF.  All questions were answered. The patient knows to call the clinic with any problems, questions or concerns. We can certainly see the patient much sooner if necessary.  I spent 30 minutes counseling the patient face to face. The total time spent in the appointment was 30 minutes.      Humphrey Rolls, MD Medical/Oncology [facility] [phone] (beeper) [phone] (Office)  02/09/2013, 2:18 PM

## 2013-02-13 ENCOUNTER — Encounter (HOSPITAL_COMMUNITY): Payer: Self-pay | Admitting: Emergency Medicine

## 2013-02-13 DIAGNOSIS — K219 Gastro-esophageal reflux disease without esophagitis: Secondary | ICD-10-CM | POA: Diagnosis present

## 2013-02-13 DIAGNOSIS — I2510 Atherosclerotic heart disease of native coronary artery without angina pectoris: Secondary | ICD-10-CM | POA: Diagnosis present

## 2013-02-13 DIAGNOSIS — S71009A Unspecified open wound, unspecified hip, initial encounter: Secondary | ICD-10-CM | POA: Diagnosis not present

## 2013-02-13 DIAGNOSIS — S7010XA Contusion of unspecified thigh, initial encounter: Secondary | ICD-10-CM | POA: Diagnosis present

## 2013-02-13 DIAGNOSIS — I252 Old myocardial infarction: Secondary | ICD-10-CM | POA: Diagnosis not present

## 2013-02-13 DIAGNOSIS — I5032 Chronic diastolic (congestive) heart failure: Secondary | ICD-10-CM | POA: Diagnosis not present

## 2013-02-13 DIAGNOSIS — C50519 Malignant neoplasm of lower-outer quadrant of unspecified female breast: Secondary | ICD-10-CM | POA: Diagnosis present

## 2013-02-13 DIAGNOSIS — W19XXXA Unspecified fall, initial encounter: Secondary | ICD-10-CM | POA: Diagnosis present

## 2013-02-13 DIAGNOSIS — Z940 Kidney transplant status: Secondary | ICD-10-CM | POA: Diagnosis not present

## 2013-02-13 DIAGNOSIS — L02419 Cutaneous abscess of limb, unspecified: Principal | ICD-10-CM | POA: Diagnosis present

## 2013-02-13 DIAGNOSIS — J45909 Unspecified asthma, uncomplicated: Secondary | ICD-10-CM | POA: Diagnosis present

## 2013-02-13 DIAGNOSIS — N186 End stage renal disease: Secondary | ICD-10-CM | POA: Diagnosis not present

## 2013-02-13 DIAGNOSIS — H5440 Blindness, one eye, unspecified eye: Secondary | ICD-10-CM | POA: Diagnosis present

## 2013-02-13 DIAGNOSIS — Z8673 Personal history of transient ischemic attack (TIA), and cerebral infarction without residual deficits: Secondary | ICD-10-CM | POA: Diagnosis not present

## 2013-02-13 DIAGNOSIS — I959 Hypotension, unspecified: Secondary | ICD-10-CM | POA: Diagnosis present

## 2013-02-13 DIAGNOSIS — M79609 Pain in unspecified limb: Secondary | ICD-10-CM | POA: Diagnosis not present

## 2013-02-13 DIAGNOSIS — K279 Peptic ulcer, site unspecified, unspecified as acute or chronic, without hemorrhage or perforation: Secondary | ICD-10-CM | POA: Diagnosis present

## 2013-02-13 DIAGNOSIS — L03119 Cellulitis of unspecified part of limb: Secondary | ICD-10-CM | POA: Diagnosis present

## 2013-02-13 DIAGNOSIS — E1129 Type 2 diabetes mellitus with other diabetic kidney complication: Secondary | ICD-10-CM | POA: Diagnosis not present

## 2013-02-13 DIAGNOSIS — E119 Type 2 diabetes mellitus without complications: Secondary | ICD-10-CM | POA: Diagnosis not present

## 2013-02-13 DIAGNOSIS — I10 Essential (primary) hypertension: Secondary | ICD-10-CM | POA: Diagnosis not present

## 2013-02-13 DIAGNOSIS — I129 Hypertensive chronic kidney disease with stage 1 through stage 4 chronic kidney disease, or unspecified chronic kidney disease: Secondary | ICD-10-CM | POA: Diagnosis present

## 2013-02-13 DIAGNOSIS — E1139 Type 2 diabetes mellitus with other diabetic ophthalmic complication: Secondary | ICD-10-CM | POA: Diagnosis present

## 2013-02-13 DIAGNOSIS — E039 Hypothyroidism, unspecified: Secondary | ICD-10-CM | POA: Diagnosis present

## 2013-02-13 DIAGNOSIS — E11319 Type 2 diabetes mellitus with unspecified diabetic retinopathy without macular edema: Secondary | ICD-10-CM | POA: Diagnosis present

## 2013-02-13 DIAGNOSIS — Z7982 Long term (current) use of aspirin: Secondary | ICD-10-CM | POA: Diagnosis not present

## 2013-02-13 DIAGNOSIS — E1159 Type 2 diabetes mellitus with other circulatory complications: Secondary | ICD-10-CM | POA: Diagnosis not present

## 2013-02-13 DIAGNOSIS — S79919A Unspecified injury of unspecified hip, initial encounter: Secondary | ICD-10-CM | POA: Diagnosis not present

## 2013-02-13 DIAGNOSIS — N189 Chronic kidney disease, unspecified: Secondary | ICD-10-CM | POA: Diagnosis not present

## 2013-02-13 DIAGNOSIS — Z794 Long term (current) use of insulin: Secondary | ICD-10-CM

## 2013-02-13 DIAGNOSIS — Z79899 Other long term (current) drug therapy: Secondary | ICD-10-CM

## 2013-02-13 DIAGNOSIS — I509 Heart failure, unspecified: Secondary | ICD-10-CM | POA: Diagnosis present

## 2013-02-13 DIAGNOSIS — I120 Hypertensive chronic kidney disease with stage 5 chronic kidney disease or end stage renal disease: Secondary | ICD-10-CM | POA: Diagnosis not present

## 2013-02-13 DIAGNOSIS — E785 Hyperlipidemia, unspecified: Secondary | ICD-10-CM | POA: Diagnosis present

## 2013-02-13 DIAGNOSIS — M7989 Other specified soft tissue disorders: Secondary | ICD-10-CM | POA: Diagnosis not present

## 2013-02-13 NOTE — ED Notes (Signed)
Gait on steady w/ need of assistance

## 2013-02-13 NOTE — ED Notes (Signed)
Pt c/o left leg thigh and knee pain with swelling and redness, after a fall x 3 weeks ago.

## 2013-02-14 ENCOUNTER — Encounter (HOSPITAL_COMMUNITY): Payer: Self-pay | Admitting: Internal Medicine

## 2013-02-14 ENCOUNTER — Inpatient Hospital Stay (HOSPITAL_COMMUNITY)
Admission: EM | Admit: 2013-02-14 | Discharge: 2013-02-23 | DRG: 580 | Disposition: A | Discharge status: Home Health | Payer: Medicare Other | Attending: Internal Medicine | Admitting: Internal Medicine

## 2013-02-14 ENCOUNTER — Encounter (HOSPITAL_COMMUNITY): Payer: Medicare Other | Admitting: Anesthesiology

## 2013-02-14 ENCOUNTER — Emergency Department (HOSPITAL_COMMUNITY): Payer: Medicare Other

## 2013-02-14 ENCOUNTER — Encounter (HOSPITAL_COMMUNITY)
Admission: EM | Disposition: A | Discharge status: Home Health | Payer: Self-pay | Source: Home / Self Care | Attending: Internal Medicine

## 2013-02-14 ENCOUNTER — Inpatient Hospital Stay (HOSPITAL_COMMUNITY): Payer: Medicare Other | Admitting: Anesthesiology

## 2013-02-14 DIAGNOSIS — L02419 Cutaneous abscess of limb, unspecified: Secondary | ICD-10-CM

## 2013-02-14 DIAGNOSIS — L03311 Cellulitis of abdominal wall: Secondary | ICD-10-CM

## 2013-02-14 DIAGNOSIS — S7010XA Contusion of unspecified thigh, initial encounter: Secondary | ICD-10-CM | POA: Diagnosis present

## 2013-02-14 DIAGNOSIS — I214 Non-ST elevation (NSTEMI) myocardial infarction: Secondary | ICD-10-CM

## 2013-02-14 DIAGNOSIS — I10 Essential (primary) hypertension: Secondary | ICD-10-CM

## 2013-02-14 DIAGNOSIS — C50511 Malignant neoplasm of lower-outer quadrant of right female breast: Secondary | ICD-10-CM

## 2013-02-14 DIAGNOSIS — E039 Hypothyroidism, unspecified: Secondary | ICD-10-CM

## 2013-02-14 DIAGNOSIS — N189 Chronic kidney disease, unspecified: Secondary | ICD-10-CM

## 2013-02-14 DIAGNOSIS — L03116 Cellulitis of left lower limb: Secondary | ICD-10-CM

## 2013-02-14 DIAGNOSIS — D649 Anemia, unspecified: Secondary | ICD-10-CM

## 2013-02-14 DIAGNOSIS — I2510 Atherosclerotic heart disease of native coronary artery without angina pectoris: Secondary | ICD-10-CM | POA: Diagnosis present

## 2013-02-14 DIAGNOSIS — L03119 Cellulitis of unspecified part of limb: Secondary | ICD-10-CM

## 2013-02-14 DIAGNOSIS — Z940 Kidney transplant status: Secondary | ICD-10-CM

## 2013-02-14 DIAGNOSIS — K219 Gastro-esophageal reflux disease without esophagitis: Secondary | ICD-10-CM

## 2013-02-14 DIAGNOSIS — I5032 Chronic diastolic (congestive) heart failure: Secondary | ICD-10-CM

## 2013-02-14 DIAGNOSIS — S7012XA Contusion of left thigh, initial encounter: Secondary | ICD-10-CM

## 2013-02-14 DIAGNOSIS — I5033 Acute on chronic diastolic (congestive) heart failure: Secondary | ICD-10-CM

## 2013-02-14 DIAGNOSIS — M726 Necrotizing fasciitis: Secondary | ICD-10-CM

## 2013-02-14 DIAGNOSIS — C50519 Malignant neoplasm of lower-outer quadrant of unspecified female breast: Secondary | ICD-10-CM

## 2013-02-14 DIAGNOSIS — E1159 Type 2 diabetes mellitus with other circulatory complications: Secondary | ICD-10-CM

## 2013-02-14 DIAGNOSIS — J9600 Acute respiratory failure, unspecified whether with hypoxia or hypercapnia: Secondary | ICD-10-CM

## 2013-02-14 HISTORY — PX: IRRIGATION AND DEBRIDEMENT ABSCESS: SHX5252

## 2013-02-14 LAB — GLUCOSE, CAPILLARY
GLUCOSE-CAPILLARY: 249 mg/dL — AB (ref 70–99)
Glucose-Capillary: 205 mg/dL — ABNORMAL HIGH (ref 70–99)
Glucose-Capillary: 171 mg/dL — ABNORMAL HIGH (ref 70–99)
Glucose-Capillary: 182 mg/dL — ABNORMAL HIGH (ref 70–99)

## 2013-02-14 LAB — CBC WITH DIFFERENTIAL/PLATELET
BASOS PCT: 0 % (ref 0–1)
Basophils Absolute: 0 10*3/uL (ref 0.0–0.1)
EOS PCT: 0 % (ref 0–5)
Eosinophils Absolute: 0 10*3/uL (ref 0.0–0.7)
HCT: 34.6 % — ABNORMAL LOW (ref 36.0–46.0)
HEMOGLOBIN: 11.4 g/dL — AB (ref 12.0–15.0)
Lymphocytes Relative: 20 % (ref 12–46)
Lymphs Abs: 0.7 10*3/uL (ref 0.7–4.0)
MCH: 30.2 pg (ref 26.0–34.0)
MCHC: 32.9 g/dL (ref 30.0–36.0)
MCV: 91.5 fL (ref 78.0–100.0)
MONO ABS: 0.7 10*3/uL (ref 0.1–1.0)
MONOS PCT: 20 % — AB (ref 3–12)
NEUTROS ABS: 2.1 10*3/uL (ref 1.7–7.7)
Neutrophils Relative %: 60 % (ref 43–77)
Platelets: 132 10*3/uL — ABNORMAL LOW (ref 150–400)
RBC: 3.78 MIL/uL — ABNORMAL LOW (ref 3.87–5.11)
RDW: 16.4 % — AB (ref 11.5–15.5)
WBC: 3.6 10*3/uL — ABNORMAL LOW (ref 4.0–10.5)

## 2013-02-14 LAB — BASIC METABOLIC PANEL
BUN: 26 mg/dL — AB (ref 6–23)
CHLORIDE: 94 meq/L — AB (ref 96–112)
CO2: 26 mEq/L (ref 19–32)
CREATININE: 1.56 mg/dL — AB (ref 0.50–1.10)
Calcium: 8.6 mg/dL (ref 8.4–10.5)
GFR calc Af Amer: 40 mL/min — ABNORMAL LOW (ref >90)
GFR calc non Af Amer: 35 mL/min — ABNORMAL LOW (ref >90)
Glucose, Bld: 190 mg/dL — ABNORMAL HIGH (ref 70–99)
Potassium: 4.5 mEq/L (ref 3.7–5.3)
Sodium: 135 mEq/L — ABNORMAL LOW (ref 137–147)

## 2013-02-14 LAB — HEMOGLOBIN A1C
Hgb A1c MFr Bld: 10.7 % — ABNORMAL HIGH (ref <5.7)
MEAN PLASMA GLUCOSE: 260 mg/dL — AB (ref <117)

## 2013-02-14 LAB — URINALYSIS, ROUTINE W REFLEX MICROSCOPIC
Glucose, UA: 250 mg/dL — AB
KETONES UR: 15 mg/dL — AB
Leukocytes, UA: NEGATIVE
Nitrite: NEGATIVE
Protein, ur: 100 mg/dL — AB
SPECIFIC GRAVITY, URINE: 1.023 (ref 1.005–1.030)
UROBILINOGEN UA: 1 mg/dL (ref 0.0–1.0)
pH: 5.5 (ref 5.0–8.0)

## 2013-02-14 LAB — URINE MICROSCOPIC-ADD ON

## 2013-02-14 LAB — CG4 I-STAT (LACTIC ACID): Lactic Acid, Venous: 1.04 mmol/L (ref 0.5–2.2)

## 2013-02-14 LAB — SEDIMENTATION RATE: Sed Rate: 99 mm/hr — ABNORMAL HIGH (ref 0–22)

## 2013-02-14 LAB — MRSA PCR SCREENING: MRSA by PCR: NEGATIVE

## 2013-02-14 SURGERY — IRRIGATION AND DEBRIDEMENT ABSCESS
Anesthesia: General | Site: Thigh | Laterality: Left

## 2013-02-14 MED ORDER — HYDROMORPHONE HCL PF 1 MG/ML IJ SOLN: 1.0000 mg | INTRAMUSCULAR | Status: DC | PRN

## 2013-02-14 MED ORDER — PIPERACILLIN-TAZOBACTAM 3.375 G IVPB 30 MIN
3.3750 g | Freq: Once | INTRAVENOUS | Status: AC
  Administered 2013-02-14: 3.375 g via INTRAVENOUS
  Filled 2013-02-14: qty 50

## 2013-02-14 MED ORDER — PROPOFOL 10 MG/ML IV BOLUS
INTRAVENOUS | Status: DC | PRN
  Administered 2013-02-14: 150 mg via INTRAVENOUS

## 2013-02-14 MED ORDER — MIDAZOLAM HCL 2 MG/2ML IJ SOLN
INTRAMUSCULAR | Status: AC
  Filled 2013-02-14: qty 2

## 2013-02-14 MED ORDER — NEOSTIGMINE METHYLSULFATE 1 MG/ML IJ SOLN
INTRAMUSCULAR | Status: AC
  Filled 2013-02-14: qty 10

## 2013-02-14 MED ORDER — SODIUM CHLORIDE 0.9 % IJ SOLN
3.0000 mL | Freq: Two times a day (BID) | INTRAMUSCULAR | Status: DC
  Administered 2013-02-14: 3 mL via INTRAVENOUS

## 2013-02-14 MED ORDER — ROCURONIUM BROMIDE 50 MG/5ML IV SOLN
INTRAVENOUS | Status: AC
  Filled 2013-02-14: qty 1

## 2013-02-14 MED ORDER — 0.9 % SODIUM CHLORIDE (POUR BTL) OPTIME
TOPICAL | Status: DC | PRN
  Administered 2013-02-14: 1000 mL

## 2013-02-14 MED ORDER — FENTANYL CITRATE 0.05 MG/ML IJ SOLN
INTRAMUSCULAR | Status: AC
  Filled 2013-02-14: qty 5

## 2013-02-14 MED ORDER — ISOSORBIDE MONONITRATE ER 60 MG PO TB24
60.0000 mg | ORAL_TABLET | Freq: Every day | ORAL | Status: DC
  Administered 2013-02-15 – 2013-02-23 (×8): 60 mg via ORAL
  Filled 2013-02-14 (×10): qty 1

## 2013-02-14 MED ORDER — SODIUM CHLORIDE 0.9 % IV SOLN
INTRAVENOUS | Status: DC
  Administered 2013-02-14 – 2013-02-17 (×5): via INTRAVENOUS
  Administered 2013-02-19: 50 mL/h via INTRAVENOUS
  Administered 2013-02-20 – 2013-02-22 (×3): via INTRAVENOUS

## 2013-02-14 MED ORDER — MIDAZOLAM HCL 2 MG/2ML IJ SOLN
INTRAMUSCULAR | Status: AC
Start: 2013-02-14 — End: 2013-02-14
  Filled 2013-02-14: qty 2

## 2013-02-14 MED ORDER — HYDROCODONE-ACETAMINOPHEN 5-325 MG PO TABS: 1.0000 | ORAL_TABLET | ORAL | Status: DC | PRN

## 2013-02-14 MED ORDER — PIPERACILLIN-TAZOBACTAM 3.375 G IVPB
3.3750 g | Freq: Three times a day (TID) | INTRAVENOUS | Status: DC
  Administered 2013-02-14 – 2013-02-21 (×21): 3.375 g via INTRAVENOUS
  Filled 2013-02-14 (×27): qty 50

## 2013-02-14 MED ORDER — LORAZEPAM 0.5 MG PO TABS: 0.5000 mg | ORAL_TABLET | Freq: Four times a day (QID) | ORAL | Status: DC | PRN

## 2013-02-14 MED ORDER — ACETAMINOPHEN 325 MG PO TABS: 650.0000 mg | ORAL_TABLET | Freq: Four times a day (QID) | ORAL | Status: DC | PRN

## 2013-02-14 MED ORDER — LACTATED RINGERS IV SOLN
INTRAVENOUS | Status: DC | PRN
  Administered 2013-02-14: 10:00:00 via INTRAVENOUS

## 2013-02-14 MED ORDER — HYDROCODONE-ACETAMINOPHEN 5-325 MG PO TABS
1.0000 | ORAL_TABLET | ORAL | Status: DC | PRN
  Administered 2013-02-14 – 2013-02-15 (×2): 1 via ORAL
  Administered 2013-02-15 (×2): 2 via ORAL
  Administered 2013-02-15: 1 via ORAL
  Administered 2013-02-16 – 2013-02-23 (×23): 2 via ORAL
  Administered 2013-02-23: 1 via ORAL
  Filled 2013-02-14 (×6): qty 2
  Filled 2013-02-14: qty 1
  Filled 2013-02-14 (×10): qty 2
  Filled 2013-02-14: qty 1
  Filled 2013-02-14 (×8): qty 2
  Filled 2013-02-14: qty 1
  Filled 2013-02-14 (×2): qty 2

## 2013-02-14 MED ORDER — PROPOFOL 10 MG/ML IV BOLUS
INTRAVENOUS | Status: AC
  Filled 2013-02-14: qty 20

## 2013-02-14 MED ORDER — INSULIN ASPART 100 UNIT/ML ~~LOC~~ SOLN
0.0000 [IU] | Freq: Three times a day (TID) | SUBCUTANEOUS | Status: DC
  Administered 2013-02-14: 3 [IU] via SUBCUTANEOUS

## 2013-02-14 MED ORDER — GLYCOPYRROLATE 0.2 MG/ML IJ SOLN
INTRAMUSCULAR | Status: AC
  Filled 2013-02-14: qty 3

## 2013-02-14 MED ORDER — ALBUTEROL SULFATE (2.5 MG/3ML) 0.083% IN NEBU: 6.0000 mL | INHALATION_SOLUTION | Freq: Four times a day (QID) | RESPIRATORY_TRACT | Status: DC | PRN

## 2013-02-14 MED ORDER — HYDROMORPHONE HCL PF 1 MG/ML IJ SOLN
1.0000 mg | INTRAMUSCULAR | Status: DC | PRN
  Administered 2013-02-15 – 2013-02-23 (×13): 1 mg via INTRAVENOUS
  Filled 2013-02-14 (×13): qty 1

## 2013-02-14 MED ORDER — BUPIVACAINE-EPINEPHRINE 0.25% -1:200000 IJ SOLN
INTRAMUSCULAR | Status: DC | PRN
  Administered 2013-02-14: 30 mL

## 2013-02-14 MED ORDER — SUCCINYLCHOLINE CHLORIDE 20 MG/ML IJ SOLN
INTRAMUSCULAR | Status: DC | PRN
  Administered 2013-02-14: 100 mg via INTRAVENOUS

## 2013-02-14 MED ORDER — LIDOCAINE HCL (CARDIAC) 20 MG/ML IV SOLN
INTRAVENOUS | Status: DC | PRN
  Administered 2013-02-14: 50 mg via INTRAVENOUS

## 2013-02-14 MED ORDER — ONDANSETRON HCL 4 MG PO TABS: 4.0000 mg | ORAL_TABLET | Freq: Four times a day (QID) | ORAL | Status: DC | PRN

## 2013-02-14 MED ORDER — AMLODIPINE BESYLATE 10 MG PO TABS
10.0000 mg | ORAL_TABLET | Freq: Every day | ORAL | Status: DC
  Filled 2013-02-14: qty 1

## 2013-02-14 MED ORDER — GLYCOPYRROLATE 0.2 MG/ML IJ SOLN
INTRAMUSCULAR | Status: AC
  Filled 2013-02-14: qty 4

## 2013-02-14 MED ORDER — MORPHINE SULFATE 2 MG/ML IJ SOLN: 2.0000 mg | INTRAMUSCULAR | Status: DC | PRN

## 2013-02-14 MED ORDER — PREDNISOLONE ACETATE 1 % OP SUSP
1.0000 [drp] | Freq: Three times a day (TID) | OPHTHALMIC | Status: DC
  Administered 2013-02-14 – 2013-02-23 (×26): 1 [drp] via OPHTHALMIC
  Filled 2013-02-14 (×2): qty 1
  Filled 2013-02-14: qty 5
  Filled 2013-02-14: qty 1

## 2013-02-14 MED ORDER — PANTOPRAZOLE SODIUM 40 MG PO TBEC
40.0000 mg | DELAYED_RELEASE_TABLET | Freq: Every day | ORAL | Status: DC
  Administered 2013-02-14 – 2013-02-23 (×9): 40 mg via ORAL
  Filled 2013-02-14 (×8): qty 1

## 2013-02-14 MED ORDER — HYDROMORPHONE HCL PF 1 MG/ML IJ SOLN: 0.2500 mg | INTRAMUSCULAR | Status: DC | PRN

## 2013-02-14 MED ORDER — VANCOMYCIN HCL IN DEXTROSE 1-5 GM/200ML-% IV SOLN
1000.0000 mg | INTRAVENOUS | Status: DC
  Administered 2013-02-15 – 2013-02-17 (×3): 1000 mg via INTRAVENOUS
  Filled 2013-02-14 (×4): qty 200

## 2013-02-14 MED ORDER — HYDROCODONE-ACETAMINOPHEN 5-325 MG PO TABS: 1.0000 | ORAL_TABLET | ORAL | Status: DC

## 2013-02-14 MED ORDER — INSULIN ASPART 100 UNIT/ML ~~LOC~~ SOLN
0.0000 [IU] | SUBCUTANEOUS | Status: DC
  Administered 2013-02-14: 3 [IU] via SUBCUTANEOUS

## 2013-02-14 MED ORDER — METOPROLOL TARTRATE 25 MG PO TABS
25.0000 mg | ORAL_TABLET | Freq: Two times a day (BID) | ORAL | Status: DC
  Administered 2013-02-14 – 2013-02-23 (×17): 25 mg via ORAL
  Filled 2013-02-14 (×21): qty 1

## 2013-02-14 MED ORDER — ONDANSETRON HCL 4 MG/2ML IJ SOLN
INTRAMUSCULAR | Status: DC | PRN
  Administered 2013-02-14: 4 mg via INTRAVENOUS

## 2013-02-14 MED ORDER — INSULIN ASPART 100 UNIT/ML ~~LOC~~ SOLN
0.0000 [IU] | SUBCUTANEOUS | Status: DC
  Administered 2013-02-14 – 2013-02-15 (×2): 2 [IU] via SUBCUTANEOUS
  Administered 2013-02-15: 3 [IU] via SUBCUTANEOUS
  Administered 2013-02-15 (×2): 2 [IU] via SUBCUTANEOUS

## 2013-02-14 MED ORDER — MAGNESIUM OXIDE 250 MG PO TABS: 250.0000 mg | ORAL_TABLET | Freq: Every day | ORAL | Status: DC

## 2013-02-14 MED ORDER — TACROLIMUS 1 MG PO CAPS
9.0000 mg | ORAL_CAPSULE | Freq: Two times a day (BID) | ORAL | Status: DC
  Administered 2013-02-14 – 2013-02-23 (×17): 9 mg via ORAL
  Filled 2013-02-14 (×22): qty 9

## 2013-02-14 MED ORDER — PREDNISONE 10 MG PO TABS
10.0000 mg | ORAL_TABLET | Freq: Every day | ORAL | Status: DC
  Administered 2013-02-15 – 2013-02-23 (×8): 10 mg via ORAL
  Filled 2013-02-14 (×10): qty 1

## 2013-02-14 MED ORDER — AZATHIOPRINE 50 MG PO TABS
50.0000 mg | ORAL_TABLET | Freq: Two times a day (BID) | ORAL | Status: DC
  Administered 2013-02-14 – 2013-02-23 (×17): 50 mg via ORAL
  Filled 2013-02-14 (×22): qty 1

## 2013-02-14 MED ORDER — ACETAMINOPHEN 650 MG RE SUPP: 650.0000 mg | Freq: Four times a day (QID) | RECTAL | Status: DC | PRN

## 2013-02-14 MED ORDER — INSULIN GLARGINE 100 UNIT/ML ~~LOC~~ SOLN
30.0000 [IU] | Freq: Every day | SUBCUTANEOUS | Status: DC
  Administered 2013-02-14 – 2013-02-15 (×2): 30 [IU] via SUBCUTANEOUS
  Filled 2013-02-14 (×3): qty 0.3

## 2013-02-14 MED ORDER — SODIUM CHLORIDE 0.9 % IV SOLN
250.0000 mL | INTRAVENOUS | Status: DC | PRN
  Administered 2013-02-14: 250 mL via INTRAVENOUS

## 2013-02-14 MED ORDER — METOPROLOL TARTRATE 100 MG PO TABS
100.0000 mg | ORAL_TABLET | Freq: Two times a day (BID) | ORAL | Status: DC
  Filled 2013-02-14 (×2): qty 1

## 2013-02-14 MED ORDER — SODIUM CHLORIDE 0.9 % IJ SOLN: 3.0000 mL | INTRAMUSCULAR | Status: DC | PRN

## 2013-02-14 MED ORDER — DOCUSATE SODIUM 100 MG PO CAPS
100.0000 mg | ORAL_CAPSULE | Freq: Two times a day (BID) | ORAL | Status: DC
  Administered 2013-02-14 – 2013-02-23 (×17): 100 mg via ORAL
  Filled 2013-02-14 (×16): qty 1

## 2013-02-14 MED ORDER — TIMOLOL MALEATE 0.25 % OP SOLN
1.0000 [drp] | Freq: Two times a day (BID) | OPHTHALMIC | Status: DC
Start: 2013-02-14 — End: 2013-02-23
  Administered 2013-02-14 – 2013-02-23 (×18): 1 [drp] via OPHTHALMIC
  Filled 2013-02-14 (×3): qty 5

## 2013-02-14 MED ORDER — CINACALCET HCL 30 MG PO TABS
30.0000 mg | ORAL_TABLET | Freq: Every day | ORAL | Status: DC
  Administered 2013-02-15 – 2013-02-23 (×8): 30 mg via ORAL
  Filled 2013-02-14 (×10): qty 1

## 2013-02-14 MED ORDER — ONDANSETRON HCL 4 MG/2ML IJ SOLN: 4.0000 mg | Freq: Four times a day (QID) | INTRAMUSCULAR | Status: DC | PRN

## 2013-02-14 MED ORDER — BUPIVACAINE-EPINEPHRINE (PF) 0.25% -1:200000 IJ SOLN
INTRAMUSCULAR | Status: AC
  Filled 2013-02-14: qty 30

## 2013-02-14 MED ORDER — MAGNESIUM OXIDE 400 (241.3 MG) MG PO TABS
200.0000 mg | ORAL_TABLET | Freq: Every day | ORAL | Status: DC
  Administered 2013-02-15 – 2013-02-23 (×8): 200 mg via ORAL
  Filled 2013-02-14 (×10): qty 0.5

## 2013-02-14 MED ORDER — VANCOMYCIN HCL IN DEXTROSE 1-5 GM/200ML-% IV SOLN
1000.0000 mg | Freq: Once | INTRAVENOUS | Status: AC
Start: 2013-02-14 — End: 2013-02-14
  Administered 2013-02-14: 1000 mg via INTRAVENOUS
  Filled 2013-02-14: qty 200

## 2013-02-14 MED ORDER — ALBUTEROL SULFATE 0.63 MG/3ML IN NEBU
3.0000 mL | INHALATION_SOLUTION | Freq: Four times a day (QID) | RESPIRATORY_TRACT | Status: DC | PRN
  Filled 2013-02-14: qty 3

## 2013-02-14 MED ORDER — TRAMADOL HCL 50 MG PO TABS: 50.0000 mg | ORAL_TABLET | Freq: Four times a day (QID) | ORAL | Status: DC | PRN

## 2013-02-14 MED ORDER — FENTANYL CITRATE 0.05 MG/ML IJ SOLN
INTRAMUSCULAR | Status: DC | PRN
  Administered 2013-02-14: 100 ug via INTRAVENOUS

## 2013-02-14 MED ORDER — ONDANSETRON HCL 4 MG/2ML IJ SOLN
INTRAMUSCULAR | Status: AC
  Filled 2013-02-14: qty 2

## 2013-02-14 MED ORDER — FUROSEMIDE 80 MG PO TABS
160.0000 mg | ORAL_TABLET | Freq: Three times a day (TID) | ORAL | Status: DC
  Filled 2013-02-14 (×3): qty 2

## 2013-02-14 MED ORDER — SIMVASTATIN 5 MG PO TABS
5.0000 mg | ORAL_TABLET | Freq: Every day | ORAL | Status: DC
  Administered 2013-02-14 – 2013-02-23 (×9): 5 mg via ORAL
  Filled 2013-02-14 (×10): qty 1

## 2013-02-14 SURGICAL SUPPLY — 34 items
BLADE SURG 15 STRL LF DISP TIS (BLADE) ×1 IMPLANT
BLADE SURG 15 STRL SS (BLADE) ×2
BNDG GAUZE ELAST 4 BULKY (GAUZE/BANDAGES/DRESSINGS) ×3 IMPLANT
CANISTER SUCTION 2500CC (MISCELLANEOUS) ×3 IMPLANT
CLEANER TIP ELECTROSURG 2X2 (MISCELLANEOUS) IMPLANT
COVER SURGICAL LIGHT HANDLE (MISCELLANEOUS) ×3 IMPLANT
DRAPE UTILITY 15X26 W/TAPE STR (DRAPE) ×6 IMPLANT
DRSG PAD ABDOMINAL 8X10 ST (GAUZE/BANDAGES/DRESSINGS) ×3 IMPLANT
ELECT REM PT RETURN 9FT ADLT (ELECTROSURGICAL) ×6
ELECTRODE REM PT RTRN 9FT ADLT (ELECTROSURGICAL) ×2 IMPLANT
GAUZE PACKING IODOFORM 1 (PACKING) IMPLANT
GAUZE SPONGE 4X4 16PLY XRAY LF (GAUZE/BANDAGES/DRESSINGS) ×3 IMPLANT
GLOVE BIO SURGEON STRL SZ8 (GLOVE) ×3 IMPLANT
GLOVE BIOGEL PI IND STRL 8 (GLOVE) ×1 IMPLANT
GLOVE BIOGEL PI INDICATOR 8 (GLOVE) ×2
GOWN STRL NON-REIN LRG LVL3 (GOWN DISPOSABLE) ×6 IMPLANT
GOWN STRL REIN XL XLG (GOWN DISPOSABLE) IMPLANT
KIT BASIN OR (CUSTOM PROCEDURE TRAY) ×3 IMPLANT
KIT ROOM TURNOVER OR (KITS) ×3 IMPLANT
NS IRRIG 1000ML POUR BTL (IV SOLUTION) ×3 IMPLANT
PACK LITHOTOMY IV (CUSTOM PROCEDURE TRAY) ×3 IMPLANT
PAD ARMBOARD 7.5X6 YLW CONV (MISCELLANEOUS) ×6 IMPLANT
PENCIL BUTTON HOLSTER BLD 10FT (ELECTRODE) IMPLANT
SPONGE GAUZE 4X4 12PLY (GAUZE/BANDAGES/DRESSINGS) ×6 IMPLANT
SWAB COLLECTION DEVICE MRSA (MISCELLANEOUS) ×3 IMPLANT
TAPE CLOTH SURG 6X10 WHT LF (GAUZE/BANDAGES/DRESSINGS) ×3 IMPLANT
TOWEL OR 17X24 6PK STRL BLUE (TOWEL DISPOSABLE) ×3 IMPLANT
TOWEL OR 17X26 10 PK STRL BLUE (TOWEL DISPOSABLE) ×3 IMPLANT
TUBE ANAEROBIC SPECIMEN COL (MISCELLANEOUS) ×3 IMPLANT
TUBE CONNECTING 12'X1/4 (SUCTIONS) ×1
TUBE CONNECTING 12X1/4 (SUCTIONS) ×2 IMPLANT
UNDERPAD 30X30 INCONTINENT (UNDERPADS AND DIAPERS) ×3 IMPLANT
WATER STERILE IRR 1000ML POUR (IV SOLUTION) ×3 IMPLANT
YANKAUER SUCT BULB TIP NO VENT (SUCTIONS) ×3 IMPLANT

## 2013-02-14 NOTE — Anesthesia Postprocedure Evaluation (Signed)
  Anesthesia Post-op Note  Patient: Monica Lee  Procedure(s) Performed: Procedure(s): IRRIGATION AND DEBRIDEMENT LEFT THIGH ABSCESS (Left)  Patient Location: PACU  Anesthesia Type:General  Level of Consciousness: awake  Airway and Oxygen Therapy: Patient Spontanous Breathing  Post-op Pain: mild  Post-op Assessment: Post-op Vital signs reviewed  Post-op Vital Signs: Reviewed  Complications: No apparent anesthesia complications

## 2013-02-14 NOTE — Preoperative (Signed)
Beta Blockers   Reason not to administer Beta Blockers:Not Applicable 

## 2013-02-14 NOTE — Interval H&P Note (Signed)
History and Physical Interval Note:  02/14/2013 10:08 AM  Monica Lee  has presented today for surgery, with the diagnosis of left thigh abscess to rule out necrotizing    The various methods of treatment have been discussed with the patient and family. After consideration of risks, benefits and other options for treatment, the patient has consented to  Procedure(s): IRRIGATION AND DEBRIDEMENT LEFT THIGH ABSCESS (Left) as a surgical intervention .  The patient's history has been reviewed, patient examined, no change in status, stable for surgery.  I have reviewed the patient's chart and labs.  Questions were answered to the patient's satisfaction.     Seini Lannom A.

## 2013-02-14 NOTE — Progress Notes (Signed)
All jewelry, cell phone, 2 bags sent home with pt's husband

## 2013-02-14 NOTE — Consult Note (Addendum)
Reason for Consult:left thigh pain Referring Physician: Dr Delora Fuel  Monica Lee is an 63 y.o. female.  HPI: 80 yof who has history of multiple medical problems including a renal transplant for which she is on imuran and prograf.  She also has recently been operated on for breast cancer and is currently undergoing chemotherapy. She last got CMF on 1/20.  She has been tolerating this pretty well.  She had a fall within the last month and led to a left lateral thigh hematoma. On her visit to the cancer center on 1/20 she needed a wheelchair.  She underwent a ct at that time that showed soft tissue edema with some skin thickening.  Since then the area has become less of a "knot" and has spread out some.  It still is causing her pain and that led her to come to er tonight.  She denies fevers.  No drainage from this area.    Past Medical History  Diagnosis Date  . Diabetes mellitus   . Acid reflux   . S/P kidney transplant   . GERD (gastroesophageal reflux disease)   . Hyperlipidemia   . DJD (degenerative joint disease)   . TIA (transient ischemic attack)   . Hx of colonic polyps   . PUD (peptic ulcer disease)   . Diabetic retinopathy   . Hemorrhoids   . Generalized headaches   . Hypertension   . Asthma   . MI (myocardial infarction) 2011  . Kidney [place]   . Mental disorder   . Stroke 2008    no deficits  . Anemia     Hx-not current  . Breast cancer   . Blind left eye   . Pneumonia   . Chronic diastolic CHF (congestive heart failure)     a. Cardiac MRI (10/2012): Moderate to severe LVH, EF 55%, chordal SAM but no LVOT gradient, mod circumferential effusion, mild LAE.  b.  Echocardiogram (11/06/12): Severe LVH, EF 35-00%, grade 1 diastolic dysfunction, moderate effusion.  Marland Kitchen Hx of cardiovascular stress test     a. Lexiscan Myoview (11/05/12): High-risk scan; fixed defect affecting the entire inferolateral and anterolateral wall; mid/apical anterior and mid/apical inferior moderate  ischemia, EF 28%.  (felt to be false + as echo and cMRI with normal EF and normal WM)  . Hx of cardiac cath     a. reportedly no CAD on cardiac cath in [place]  . Wears glasses     Past Surgical History  Procedure Laterality Date  . Transplantation renal  05/2006  . Cardiac catheterization  2011  . Abdominal hysterectomy    . Cataract extraction  bil  . Intestinal perforation surgery  01/2009  . Breast biopsy  2010  . Gallstones removed    . Cholecystectomy  2008  . Colon surgery  2013    polyps removed  . Hemorrhoid surgery  01/23/2012    Procedure: HEMORRHOIDECTOMY PROLAPSED;  Surgeon: Leighton Ruff, MD;  Location: [facility];  Service: General;  Laterality: N/A;  . Eye surgery Left 1997  . Foot surgery Right 2013  . Breast lumpectomy with sentinel lymph node biopsy Right 11/09/2012    Procedure: RIGHT BREAST LUMPECTOMY WITH SENTINEL LYMPH NODE REMOVAL;  Surgeon: Haywood Lasso, MD;  Location: [facility];  Service: General;  Laterality: Right;  . Portacath placement Right 11/30/2012    Procedure: INSERTION PORT-A-CATH;  Surgeon: Haywood Lasso, MD;  Location: [place];  Service: General;  Laterality: Right;  Family History  Problem Relation Age of Onset  . Kidney disease Neg Hx   . Arthritis Other     parent  . Heart disease Other     parent  . Hypertension Other     parent  . Diabetes Other     parent  . Diabetes Other     grandparent  . Thyroid disease Other     several  . Heart disease Father     Social History:  reports that she has never smoked. She has never used smokeless tobacco. She reports that she does not drink alcohol or use illicit drugs.  Allergies:  Allergies  Allergen Reactions  . Cephalexin Swelling    Swelling to face, chills  . Propoxyphene N-Acetaminophen Hives and Itching    Medications: I have reviewed the patient's current medications.  Results for orders placed during the hospital encounter of  02/14/13 (from the past 48 hour(s))  CBC WITH DIFFERENTIAL     Status: Abnormal   Collection Time    02/14/13  1:27 AM      Result Value Range   WBC 3.6 (*) 4.0 - 10.5 K/uL   RBC 3.78 (*) 3.87 - 5.11 MIL/uL   Hemoglobin 11.4 (*) 12.0 - 15.0 g/dL   HCT 34.6 (*) 36.0 - 46.0 %   MCV 91.5  78.0 - 100.0 fL   MCH 30.2  26.0 - 34.0 pg   MCHC 32.9  30.0 - 36.0 g/dL   RDW 16.4 (*) 11.5 - 15.5 %   Platelets 132 (*) 150 - 400 K/uL   Neutrophils Relative % 60  43 - 77 %   Neutro Abs 2.1  1.7 - 7.7 K/uL   Lymphocytes Relative 20  12 - 46 %   Lymphs Abs 0.7  0.7 - 4.0 K/uL   Monocytes Relative 20 (*) 3 - 12 %   Monocytes Absolute 0.7  0.1 - 1.0 K/uL   Eosinophils Relative 0  0 - 5 %   Eosinophils Absolute 0.0  0.0 - 0.7 K/uL   Basophils Relative 0  0 - 1 %   Basophils Absolute 0.0  0.0 - 0.1 K/uL  URINALYSIS, ROUTINE W REFLEX MICROSCOPIC     Status: Abnormal   Collection Time    02/14/13  2:28 AM      Result Value Range   Color, Urine AMBER (*) YELLOW   Comment: BIOCHEMICALS MAY BE AFFECTED BY COLOR   APPearance CLOUDY (*) CLEAR   Specific Gravity, Urine 1.023  1.005 - 1.030   pH 5.5  5.0 - 8.0   Glucose, UA 250 (*) NEGATIVE mg/dL   Hgb urine dipstick TRACE (*) NEGATIVE   Bilirubin Urine SMALL (*) NEGATIVE   Ketones, ur 15 (*) NEGATIVE mg/dL   Protein, ur 100 (*) NEGATIVE mg/dL   Urobilinogen, UA 1.0  0.0 - 1.0 mg/dL   Nitrite NEGATIVE  NEGATIVE   Leukocytes, UA NEGATIVE  NEGATIVE  URINE MICROSCOPIC-ADD ON     Status: Abnormal   Collection Time    02/14/13  2:28 AM      Result Value Range   Squamous Epithelial / LPF FEW (*) RARE   WBC, UA 0-2  <3 WBC/hpf   RBC / HPF 3-6  <3 RBC/hpf   Bacteria, UA FEW (*) RARE   Casts HYALINE CASTS (*) NEGATIVE   Urine-Other MUCOUS PRESENT    BASIC METABOLIC PANEL     Status: Abnormal   Collection Time    02/14/13  3:02  AM      Result Value Range   Sodium 135 (*) 137 - 147 mEq/L   Potassium 4.5  3.7 - 5.3 mEq/L   Chloride 94 (*) 96 - 112  mEq/L   CO2 26  19 - 32 mEq/L   Glucose, Bld 190 (*) 70 - 99 mg/dL   BUN 26 (*) 6 - 23 mg/dL   Creatinine, Ser 1.56 (*) 0.50 - 1.10 mg/dL   Calcium 8.6  8.4 - 10.5 mg/dL   GFR calc non Af Amer 35 (*) >90 mL/min   GFR calc Af Amer 40 (*) >90 mL/min   Comment: (NOTE)     The eGFR has been calculated using the CKD EPI equation.     This calculation has not been validated in all clinical situations.     eGFR's persistently <90 mL/min signify possible Chronic Kidney     Disease.  SEDIMENTATION RATE     Status: Abnormal   Collection Time    02/14/13  3:02 AM      Result Value Range   Sed Rate 99 (*) 0 - 22 mm/hr  CG4 I-STAT (LACTIC ACID)     Status: None   Collection Time    02/14/13  3:07 AM      Result Value Range   Lactic Acid, Venous 1.04  0.5 - 2.2 mmol/L    Dg Femur Left  02/14/2013   CLINICAL DATA:  Fall with pain and swelling.  EXAM: LEFT FEMUR - 2 VIEW  COMPARISON:  Abdomen pelvis CT 02/09/2013  FINDINGS: There is an 8 cm area of subcutaneous gas in the anterior and proximal left thigh. There is no radiodense foreign body. No acute osseous findings, including fracture or osseous infection. Diffuse arterial calcification in this patient with history of diabetes.  These results were called by telephone at the time of interpretation on 02/14/2013 at 2:56 AM to Dr. Delora Fuel , who verbally acknowledged these results.  IMPRESSION: 8 cm area of subcutaneous gas in the anterior and upper thigh. If there is no skin breech or recent procedure, findings concerning for necrotizing infection.   Electronically Signed   By: Jorje Guild M.D.   On: 02/14/2013 02:56    Review of Systems  Constitutional: Negative for fever.  Respiratory: Negative for cough.   Gastrointestinal: Negative for abdominal pain.   Blood pressure 108/68, pulse 89, temperature 100 F (37.8 C), temperature source Oral, resp. rate 18, height '5\' 5"'  (1.651 m), weight 173 lb (78.472 kg), SpO2 93.00%. Physical Exam  Vitals  reviewed. Constitutional: She is oriented to person, place, and time. She appears well-developed and well-nourished.  Cardiovascular: Normal rate, regular rhythm and normal heart sounds.   Respiratory: Effort normal and breath sounds normal. She has no wheezes. She has no rales.    GI: Soft. There is no tenderness.  Musculoskeletal:       Left hip: She exhibits tenderness and swelling.       Legs: Lymphadenopathy:    She has no cervical adenopathy.  Neurological: She is alert and oriented to person, place, and time.    Assessment/Plan: Left thigh hematoma, cellulitis, ? Abscess  She is not systemically ill but certainly with imuran/prograf and then chemotherapy she is immunosuppressed.  Im not sure if this is secondarily infected.  It doesn't appear she has a NASTI.  Will await ct scan, may be reasonable to attempt course of abx therapy to see if improves quickly before considering operative drainage.  Monica Lee 02/14/2013,  4:50 AM    Addendum: she has subcutaneous gas in this area, with immunosuppression/chemo I think best to go to or and drain this.  We discussed anesthesia, drainage and open wound postop with possible slow healing. I will also forward note to Dr Humphrey Rolls as this will impact her chemotherapy.

## 2013-02-14 NOTE — Consult Note (Signed)
Renal Service Consult Note [facility]  Monica Lee 02/14/2013 Monica Lee Requesting Physician:  Dr Roel Cluck  Reason for Consult:  Renal transplant patient with infection L thigh HPI: The patient is a 63 y.o. year-old with hx of renal transplant at [facility] in 2008, followed by Dr Deterding at [facility].  Takes prograf, imuran and prednisone.  Hx of CVA, breast cancer on chemoRx, Dm, blind L eye, MI also. In Oct 2014 pt had partial mastectomy with LN dissection for stage IIa breast cancer and has been getting chemoRx.   Pt presented to ED this am with L thigh pain and swelling . She hit her thigh about 4 wks ago.  Pain got worse and worse.   ROS  no cough or SOB  no chest pain  no abd pain , n/v/d  no dysuria  Past Medical History  Past Medical History  Diagnosis Date  . Diabetes mellitus   . Acid reflux   . S/P kidney transplant   . GERD (gastroesophageal reflux disease)   . Hyperlipidemia   . DJD (degenerative joint disease)   . TIA (transient ischemic attack)   . Hx of colonic polyps   . PUD (peptic ulcer disease)   . Diabetic retinopathy   . Hemorrhoids   . Generalized headaches   . Hypertension   . Asthma   . MI (myocardial infarction) 2011  . Kidney [place]   . Mental disorder   . Stroke 2008    no deficits  . Anemia     Hx-not current  . Breast cancer   . Blind left eye   . Pneumonia   . Chronic diastolic CHF (congestive heart failure)     a. Cardiac MRI (10/2012): Moderate to severe LVH, EF 55%, chordal SAM but no LVOT gradient, mod circumferential effusion, mild LAE.  b.  Echocardiogram (11/06/12): Severe LVH, EF 61-60%, grade 1 diastolic dysfunction, moderate effusion.  Marland Kitchen Hx of cardiovascular stress test     a. Lexiscan Myoview (11/05/12): High-risk scan; fixed defect affecting the entire inferolateral and anterolateral wall; mid/apical anterior and mid/apical inferior moderate ischemia, EF 28%.  (felt to be false + as echo and cMRI with normal EF  and normal WM)  . Hx of cardiac cath     a. reportedly no CAD on cardiac cath in [place]  . Wears glasses    Past Surgical History  Past Surgical History  Procedure Laterality Date  . Transplantation renal  05/2006  . Cardiac catheterization  2011  . Abdominal hysterectomy    . Cataract extraction  bil  . Intestinal perforation surgery  01/2009  . Breast biopsy  2010  . Gallstones removed    . Cholecystectomy  2008  . Colon surgery  2013    polyps removed  . Hemorrhoid surgery  01/23/2012    Procedure: HEMORRHOIDECTOMY PROLAPSED;  Surgeon: Leighton Ruff, MD;  Location: [facility];  Service: General;  Laterality: N/A;  . Eye surgery Left 1997  . Foot surgery Right 2013  . Breast lumpectomy with sentinel lymph node biopsy Right 11/09/2012    Procedure: RIGHT BREAST LUMPECTOMY WITH SENTINEL LYMPH NODE REMOVAL;  Surgeon: Haywood Lasso, MD;  Location: [place];  Service: General;  Laterality: Right;  . Portacath placement Right 11/30/2012    Procedure: INSERTION PORT-A-CATH;  Surgeon: Haywood Lasso, MD;  Location: [place];  Service: General;  Laterality: Right;   Family History  Family History  Problem Relation Age of Onset  .  Kidney disease Neg Hx   . Arthritis Other     parent  . Heart disease Other     parent  . Hypertension Other     parent  . Diabetes Other     parent  . Diabetes Other     grandparent  . Thyroid disease Other     several  . Heart disease Father    Social History  reports that she has never smoked. She has never used smokeless tobacco. She reports that she does not drink alcohol or use illicit drugs. Allergies  Allergies  Allergen Reactions  . Cephalexin Swelling    Swelling to face, chills Tolerates zosyn 02/14/13  . Propoxyphene N-Acetaminophen Hives and Itching   Home medications Prior to Admission medications   Medication Sig Start Date End Date Taking? Authorizing Provider  albuterol (ACCUNEB) 0.63  MG/3ML nebulizer solution Take 1 ampule by nebulization every 6 (six) hours as needed for wheezing.   Yes Historical Provider, MD  albuterol (PROVENTIL HFA;VENTOLIN HFA) 108 (90 BASE) MCG/ACT inhaler Inhale 2 puffs into the lungs every 6 (six) hours as needed for wheezing or shortness of breath.   Yes Historical Provider, MD  amLODipine (NORVASC) 10 MG tablet Take 1 tablet (10 mg total) by mouth daily. 09/28/12  Yes Biagio Borg, MD  aspirin EC 325 MG tablet Take 325 mg by mouth daily.     Yes Historical Provider, MD  azaTHIOprine (IMURAN) 50 MG tablet Take 50 mg by mouth 2 (two) times daily.     Yes Historical Provider, MD  cinacalcet (SENSIPAR) 30 MG tablet Take 1 tablet (30 mg total) by mouth daily. 04/09/12  Yes Biagio Borg, MD  dexamethasone (DECADRON) 4 MG tablet Take 2 tablets (8 mg total) by mouth 2 (two) times daily with a meal. Take daily starting the day after chemotherapy for 2 days. Take with food. 11/23/12  Yes Deatra Robinson, MD  docusate sodium (COLACE) 100 MG capsule Take 1 capsule (100 mg total) by mouth 2 (two) times daily. 4/0/97  Yes Leighton Ruff, MD  esomeprazole (NEXIUM) 40 MG capsule Take 40 mg by mouth daily before breakfast.   Yes Historical Provider, MD  furosemide (LASIX) 80 MG tablet Take 160 mg by mouth 3 (three) times daily.    Yes Historical Provider, MD  insulin aspart (NOVOLOG) 100 UNIT/ML injection Inject 13-16 Units into the skin 3 (three) times daily. Injects 13 units daily at breakfast, 13 units daily at lunch, and 16 units daily at supper.   Yes Historical Provider, MD  insulin glargine (LANTUS) 100 UNIT/ML injection Inject 55 Units into the skin at bedtime. 04/28/12 04/28/13 Yes Philemon Kingdom, MD  isosorbide mononitrate (IMDUR) 60 MG 24 hr tablet Take 1 tablet (60 mg total) by mouth daily. 12/28/12  Yes Biagio Borg, MD  lidocaine-prilocaine (EMLA) cream Apply 1 application topically as needed (access port for chemo). Use only on tuesdays and Thursday with chemo    Yes Historical Provider, MD  Magnesium Oxide 250 MG TABS Take 250 mg by mouth daily.    Yes Historical Provider, MD  metoprolol (LOPRESSOR) 100 MG tablet Take 1 tablet (100 mg total) by mouth 2 (two) times daily. 12/02/12  Yes Josue Hector, MD  minoxidil (LONITEN) 2.5 MG tablet Take 2.5 mg by mouth 2 (two) times daily.   Yes Historical Provider, MD  ondansetron (ZOFRAN) 8 MG tablet Take 1 tablet (8 mg total) by mouth 2 (two) times daily. Take two times a  day starting the day after chemo for 2 days. Then take two times a day as needed for nausea or vomiting. 11/23/12  Yes Deatra Robinson, MD  polyethylene glycol (MIRALAX / GLYCOLAX) packet Take 17 g by mouth daily. 11/24/12  Yes Biagio Borg, MD  potassium chloride (KLOR-CON 10) 10 MEQ tablet Take 1 tablet (10 mEq total) by mouth 2 (two) times daily. 04/09/12  Yes Biagio Borg, MD  pravastatin (PRAVACHOL) 20 MG tablet Take 10 mg by mouth at bedtime.     Yes Historical Provider, MD  predniSONE (DELTASONE) 10 MG tablet Take 10 mg by mouth daily.   Yes Historical Provider, MD  tacrolimus (PROGRAF) 1 MG capsule Take 9 mg by mouth 2 (two) times daily.    Yes Historical Provider, MD  timolol (TIMOPTIC) 0.25 % ophthalmic solution Place 1 drop into the right eye 2 (two) times daily.   Yes Historical Provider, MD  traMADol (ULTRAM) 50 MG tablet Take 50 mg by mouth every 6 (six) hours as needed for pain.   Yes Historical Provider, MD  LORazepam (ATIVAN) 0.5 MG tablet Take 1 tablet (0.5 mg total) by mouth every 6 (six) hours as needed (Nausea or vomiting). 11/23/12   Deatra Robinson, MD  nitroGLYCERIN (NITROSTAT) 0.4 MG SL tablet Place 1 tablet (0.4 mg total) under the tongue every 5 (five) minutes as needed for chest pain. 09/30/11 03/22/13  Renella Cunas, MD  prednisoLONE acetate (PRED FORTE) 1 % ophthalmic suspension Place 1 drop into the left eye 3 (three) times daily.    Historical Provider, MD  prochlorperazine (COMPAZINE) 10 MG tablet Take 1 tablet (10 mg  total) by mouth every 6 (six) hours as needed (Nausea or vomiting). 11/23/12   Deatra Robinson, MD   Liver Function Tests  Recent Labs Lab 02/09/13 1014  AST 39*  ALT 19  ALKPHOS 64  BILITOT 1.39*  PROT 6.6  ALBUMIN 3.0*   No results found for this basename: LIPASE, AMYLASE,  in the last 168 hours CBC  Recent Labs Lab 02/09/13 1013 02/14/13 0127  WBC 4.8 3.6*  NEUTROABS 4.2 2.1  HGB 11.5* 11.4*  HCT 35.1 34.6*  MCV 92.0 91.5  PLT 99* [id number]*   Basic Metabolic Panel  Recent Labs Lab 02/09/13 1014 02/14/13 0302  NA 134* 135*  K 4.2 4.5  CL  --  94*  CO2 31* 26  GLUCOSE 257* 190*  BUN 24.7 26*  CREATININE 1.5* 1.56*  CALCIUM 9.0 8.6    Exam  Blood pressure 118/70, pulse 97, temperature 99.4 F (37.4 C), temperature source Oral, resp. rate 19, height 5\' 5"  (1.651 m), weight 73.936 kg (163 lb), SpO2 100.00%. Lethargic but responsive No rash, cyanosis or gangrene Sclera anicteric, throat clear Chest is clear bilat RRR no MRG Abd soft, obese, RLQ transplant nontender, no hsm or ascites GU foley in with clear amber urine No leg or UE edema, except mild L thigh swelling postop Neuro is nf, ox3  UA- 100 prot, 3-6 rbc, 0-2 wbc, few bact Crea 1.56 CXR not done  Assessment: 1 S/P left thigh abcess I&D 2 Renal transplant, stable creat 3 HTN, multiple BP meds at home, BP soft here 4 DM2 on insulin   Plan- continue OP regimen with transplant meds including prograf, cellcept and po prednisone. Decreased BP meds, keep BP over 110, starting IVF's at 75/hr. She looks good today postop.   Kelly Splinter MD (pgr) [phone]    [phone] 02/14/2013, 3:25  PM

## 2013-02-14 NOTE — ED Notes (Signed)
Pt. Is lethargic. States she has been more tired than normal for the past 2 days.

## 2013-02-14 NOTE — ED Notes (Signed)
Patient transported to X-ray 

## 2013-02-14 NOTE — ED Notes (Signed)
Pt. Reports fall x3 weeks ago. Since then reports left thigh swelling, redness, and tender/warm to touch.

## 2013-02-14 NOTE — H&P (View-Only) (Signed)
Reason for Consult:left thigh pain Referring Physician: Dr Delora Fuel  Monica Lee is an 63 y.o. female.  HPI: 68 yof who has history of multiple medical problems including a renal transplant for which she is on imuran and prograf.  She also has recently been operated on for breast cancer and is currently undergoing chemotherapy. She last got CMF on 1/20.  She has been tolerating this pretty well.  She had a fall within the last month and led to a left lateral thigh hematoma. On her visit to the cancer center on 1/20 she needed a wheelchair.  She underwent a ct at that time that showed soft tissue edema with some skin thickening.  Since then the area has become less of a "knot" and has spread out some.  It still is causing her pain and that led her to come to er tonight.  She denies fevers.  No drainage from this area.    Past Medical History  Diagnosis Date  . Diabetes mellitus   . Acid reflux   . S/P kidney transplant   . GERD (gastroesophageal reflux disease)   . Hyperlipidemia   . DJD (degenerative joint disease)   . TIA (transient ischemic attack)   . Hx of colonic polyps   . PUD (peptic ulcer disease)   . Diabetic retinopathy   . Hemorrhoids   . Generalized headaches   . Hypertension   . Asthma   . MI (myocardial infarction) 2011  . Kidney [place]   . Mental disorder   . Stroke 2008    no deficits  . Anemia     Hx-not current  . Breast cancer   . Blind left eye   . Pneumonia   . Chronic diastolic CHF (congestive heart failure)     a. Cardiac MRI (10/2012): Moderate to severe LVH, EF 55%, chordal SAM but no LVOT gradient, mod circumferential effusion, mild LAE.  b.  Echocardiogram (11/06/12): Severe LVH, EF 65-03%, grade 1 diastolic dysfunction, moderate effusion.  Marland Kitchen Hx of cardiovascular stress test     a. Lexiscan Myoview (11/05/12): High-risk scan; fixed defect affecting the entire inferolateral and anterolateral wall; mid/apical anterior and mid/apical inferior moderate  ischemia, EF 28%.  (felt to be false + as echo and cMRI with normal EF and normal WM)  . Hx of cardiac cath     a. reportedly no CAD on cardiac cath in [place]  . Wears glasses     Past Surgical History  Procedure Laterality Date  . Transplantation renal  05/2006  . Cardiac catheterization  2011  . Abdominal hysterectomy    . Cataract extraction  bil  . Intestinal perforation surgery  01/2009  . Breast biopsy  2010  . Gallstones removed    . Cholecystectomy  2008  . Colon surgery  2013    polyps removed  . Hemorrhoid surgery  01/23/2012    Procedure: HEMORRHOIDECTOMY PROLAPSED;  Surgeon: Leighton Ruff, MD;  Location: [facility];  Service: General;  Laterality: N/A;  . Eye surgery Left 1997  . Foot surgery Right 2013  . Breast lumpectomy with sentinel lymph node biopsy Right 11/09/2012    Procedure: RIGHT BREAST LUMPECTOMY WITH SENTINEL LYMPH NODE REMOVAL;  Surgeon: Haywood Lasso, MD;  Location: [place];  Service: General;  Laterality: Right;  . Portacath placement Right 11/30/2012    Procedure: INSERTION PORT-A-CATH;  Surgeon: Haywood Lasso, MD;  Location: [place];  Service: General;  Laterality: Right;  Family History  Problem Relation Age of Onset  . Kidney disease Neg Hx   . Arthritis Other     parent  . Heart disease Other     parent  . Hypertension Other     parent  . Diabetes Other     parent  . Diabetes Other     grandparent  . Thyroid disease Other     several  . Heart disease Father     Social History:  reports that she has never smoked. She has never used smokeless tobacco. She reports that she does not drink alcohol or use illicit drugs.  Allergies:  Allergies  Allergen Reactions  . Cephalexin Swelling    Swelling to face, chills  . Propoxyphene N-Acetaminophen Hives and Itching    Medications: I have reviewed the patient's current medications.  Results for orders placed during the hospital encounter of  02/14/13 (from the past 48 hour(s))  CBC WITH DIFFERENTIAL     Status: Abnormal   Collection Time    02/14/13  1:27 AM      Result Value Range   WBC 3.6 (*) 4.0 - 10.5 K/uL   RBC 3.78 (*) 3.87 - 5.11 MIL/uL   Hemoglobin 11.4 (*) 12.0 - 15.0 g/dL   HCT 34.6 (*) 36.0 - 46.0 %   MCV 91.5  78.0 - 100.0 fL   MCH 30.2  26.0 - 34.0 pg   MCHC 32.9  30.0 - 36.0 g/dL   RDW 16.4 (*) 11.5 - 15.5 %   Platelets 132 (*) 150 - 400 K/uL   Neutrophils Relative % 60  43 - 77 %   Neutro Abs 2.1  1.7 - 7.7 K/uL   Lymphocytes Relative 20  12 - 46 %   Lymphs Abs 0.7  0.7 - 4.0 K/uL   Monocytes Relative 20 (*) 3 - 12 %   Monocytes Absolute 0.7  0.1 - 1.0 K/uL   Eosinophils Relative 0  0 - 5 %   Eosinophils Absolute 0.0  0.0 - 0.7 K/uL   Basophils Relative 0  0 - 1 %   Basophils Absolute 0.0  0.0 - 0.1 K/uL  URINALYSIS, ROUTINE W REFLEX MICROSCOPIC     Status: Abnormal   Collection Time    02/14/13  2:28 AM      Result Value Range   Color, Urine AMBER (*) YELLOW   Comment: BIOCHEMICALS MAY BE AFFECTED BY COLOR   APPearance CLOUDY (*) CLEAR   Specific Gravity, Urine 1.023  1.005 - 1.030   pH 5.5  5.0 - 8.0   Glucose, UA 250 (*) NEGATIVE mg/dL   Hgb urine dipstick TRACE (*) NEGATIVE   Bilirubin Urine SMALL (*) NEGATIVE   Ketones, ur 15 (*) NEGATIVE mg/dL   Protein, ur 100 (*) NEGATIVE mg/dL   Urobilinogen, UA 1.0  0.0 - 1.0 mg/dL   Nitrite NEGATIVE  NEGATIVE   Leukocytes, UA NEGATIVE  NEGATIVE  URINE MICROSCOPIC-ADD ON     Status: Abnormal   Collection Time    02/14/13  2:28 AM      Result Value Range   Squamous Epithelial / LPF FEW (*) RARE   WBC, UA 0-2  <3 WBC/hpf   RBC / HPF 3-6  <3 RBC/hpf   Bacteria, UA FEW (*) RARE   Casts HYALINE CASTS (*) NEGATIVE   Urine-Other MUCOUS PRESENT    BASIC METABOLIC PANEL     Status: Abnormal   Collection Time    02/14/13  3:02  AM      Result Value Range   Sodium 135 (*) 137 - 147 mEq/L   Potassium 4.5  3.7 - 5.3 mEq/L   Chloride 94 (*) 96 - 112  mEq/L   CO2 26  19 - 32 mEq/L   Glucose, Bld 190 (*) 70 - 99 mg/dL   BUN 26 (*) 6 - 23 mg/dL   Creatinine, Ser 1.56 (*) 0.50 - 1.10 mg/dL   Calcium 8.6  8.4 - 10.5 mg/dL   GFR calc non Af Amer 35 (*) >90 mL/min   GFR calc Af Amer 40 (*) >90 mL/min   Comment: (NOTE)     The eGFR has been calculated using the CKD EPI equation.     This calculation has not been validated in all clinical situations.     eGFR's persistently <90 mL/min signify possible Chronic Kidney     Disease.  SEDIMENTATION RATE     Status: Abnormal   Collection Time    02/14/13  3:02 AM      Result Value Range   Sed Rate 99 (*) 0 - 22 mm/hr  CG4 I-STAT (LACTIC ACID)     Status: None   Collection Time    02/14/13  3:07 AM      Result Value Range   Lactic Acid, Venous 1.04  0.5 - 2.2 mmol/L    Dg Femur Left  02/14/2013   CLINICAL DATA:  Fall with pain and swelling.  EXAM: LEFT FEMUR - 2 VIEW  COMPARISON:  Abdomen pelvis CT 02/09/2013  FINDINGS: There is an 8 cm area of subcutaneous gas in the anterior and proximal left thigh. There is no radiodense foreign body. No acute osseous findings, including fracture or osseous infection. Diffuse arterial calcification in this patient with history of diabetes.  These results were called by telephone at the time of interpretation on 02/14/2013 at 2:56 AM to Dr. Delora Fuel , who verbally acknowledged these results.  IMPRESSION: 8 cm area of subcutaneous gas in the anterior and upper thigh. If there is no skin breech or recent procedure, findings concerning for necrotizing infection.   Electronically Signed   By: Jorje Guild M.D.   On: 02/14/2013 02:56    Review of Systems  Constitutional: Negative for fever.  Respiratory: Negative for cough.   Gastrointestinal: Negative for abdominal pain.   Blood pressure 108/68, pulse 89, temperature 100 F (37.8 C), temperature source Oral, resp. rate 18, height '5\' 5"'  (1.651 m), weight 173 lb (78.472 kg), SpO2 93.00%. Physical Exam  Vitals  reviewed. Constitutional: She is oriented to person, place, and time. She appears well-developed and well-nourished.  Cardiovascular: Normal rate, regular rhythm and normal heart sounds.   Respiratory: Effort normal and breath sounds normal. She has no wheezes. She has no rales.    GI: Soft. There is no tenderness.  Musculoskeletal:       Left hip: She exhibits tenderness and swelling.       Legs: Lymphadenopathy:    She has no cervical adenopathy.  Neurological: She is alert and oriented to person, place, and time.    Assessment/Plan: Left thigh hematoma, cellulitis, ? Abscess  She is not systemically ill but certainly with imuran/prograf and then chemotherapy she is immunosuppressed.  Im not sure if this is secondarily infected.  It doesn't appear she has a NASTI.  Will await ct scan, may be reasonable to attempt course of abx therapy to see if improves quickly before considering operative drainage.  Tung Pustejovsky 02/14/2013,  4:50 AM    Addendum: she has subcutaneous gas in this area, with immunosuppression/chemo I think best to go to or and drain this.  We discussed anesthesia, drainage and open wound postop with possible slow healing. I will also forward note to Dr Humphrey Rolls as this will impact her chemotherapy.

## 2013-02-14 NOTE — ED Notes (Signed)
OR called at [id number], no response.

## 2013-02-14 NOTE — H&P (Signed)
PCP:  [name], MD  Cardiology [facility] Renal Dr.  Jimmy Footman  Chief Complaint:  Leg pain  HPI: Monica Lee is a 63 y.o. female   has a past medical history of Diabetes mellitus; Acid reflux; S/P kidney transplant; GERD (gastroesophageal reflux disease); Hyperlipidemia; DJD (degenerative joint disease); TIA (transient ischemic attack); colonic polyps; PUD (peptic ulcer disease); Diabetic retinopathy; Hemorrhoids; Generalized headaches; Hypertension; Asthma; MI (myocardial infarction) (2011); Kidney Srey; Mental disorder; Stroke (2008); Anemia; Breast cancer; Blind left eye; Pneumonia; Chronic diastolic CHF (congestive heart failure); cardiovascular stress test; cardiac cath; and Wears glasses.   Presented with  Patient hit her left thigh 4 weeks ago. She noted large swelling followed by increase in pain and now she is unable to tolerate any weight bearing. 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 Humphrey Rolls last treatment was 41 th of January. Patient is also currently getting radiation therapy.  Patient is sp renal transplant 05/29/2006 at [facility]. She is currently on prograf and imuran.  Patient had plain films done worrisome for gas within tissues a CT scan was done worrisome for for superficial necrotizing fasciitis, without subfascial extent. Dr. Donne Hazel have been consulted and have seen the patient.     Review of Systems:    Pertinent positives include: chills,  non-productive cough,   Constitutional:  No weight loss, night sweats, Fevers, fatigue, weight loss  HEENT:  No headaches, Difficulty swallowing,Tooth/dental problems,Sore throat,  No sneezing, itching, ear ache, nasal congestion, post nasal drip,  Cardio-vascular:  No chest pain, Orthopnea, PND, anasarca, dizziness, palpitations.no Bilateral lower extremity swelling  GI:  No heartburn, indigestion, abdominal pain, nausea, vomiting,  diarrhea, change in bowel habits, loss of appetite, melena, blood in stool, hematemesis Resp:  no shortness of breath at rest. No dyspnea on exertion, No excess mucus, no productive cough, NoNo coughing up of blood.No change in color of mucus.No wheezing. Skin:  no rash or lesions. No jaundice GU:  no dysuria, change in color of urine, no urgency or frequency. No straining to urinate.  No flank pain.  Musculoskeletal:  No joint pain or no joint swelling. No decreased range of motion. No back pain.  Psych:  No change in mood or affect. No depression or anxiety. No memory loss.  Neuro: no localizing neurological complaints, no tingling, no weakness, no double vision, no gait abnormality, no slurred speech, no confusion  Otherwise ROS are negative except for above, 10 systems were reviewed  Past Medical History: Past Medical History  Diagnosis Date  . Diabetes mellitus   . Acid reflux   . S/P kidney transplant   . GERD (gastroesophageal reflux disease)   . Hyperlipidemia   . DJD (degenerative joint disease)   . TIA (transient ischemic attack)   . Hx of colonic polyps   . PUD (peptic ulcer disease)   . Diabetic retinopathy   . Hemorrhoids   . Generalized headaches   . Hypertension   . Asthma   . MI (myocardial infarction) 2011  . Kidney [place]   . Mental disorder   . Stroke 2008    no deficits  . Anemia     Hx-not current  . Breast cancer   . Blind left eye   . Pneumonia   . Chronic diastolic CHF (congestive heart failure)     a. Cardiac MRI (10/2012): Moderate to severe LVH, EF 55%, chordal SAM but no LVOT gradient, mod circumferential effusion,  mild LAE.  b.  Echocardiogram (11/06/12): Severe LVH, EF 27-06%, grade 1 diastolic dysfunction, moderate effusion.  Marland Kitchen Hx of cardiovascular stress test     a. Lexiscan Myoview (11/05/12): High-risk scan; fixed defect affecting the entire inferolateral and anterolateral wall; mid/apical anterior and mid/apical inferior moderate  ischemia, EF 28%.  (felt to be false + as echo and cMRI with normal EF and normal WM)  . Hx of cardiac cath     a. reportedly no CAD on cardiac cath in [place]  . Wears glasses    Past Surgical History  Procedure Laterality Date  . Transplantation renal  05/2006  . Cardiac catheterization  2011  . Abdominal hysterectomy    . Cataract extraction  bil  . Intestinal perforation surgery  01/2009  . Breast biopsy  2010  . Gallstones removed    . Cholecystectomy  2008  . Colon surgery  2013    polyps removed  . Hemorrhoid surgery  01/23/2012    Procedure: HEMORRHOIDECTOMY PROLAPSED;  Surgeon: Leighton Ruff, MD;  Location: [facility];  Service: General;  Laterality: N/A;  . Eye surgery Left 1997  . Foot surgery Right 2013  . Breast lumpectomy with sentinel lymph node biopsy Right 11/09/2012    Procedure: RIGHT BREAST LUMPECTOMY WITH SENTINEL LYMPH NODE REMOVAL;  Surgeon: Haywood Lasso, MD;  Location: [place];  Service: General;  Laterality: Right;  . Portacath placement Right 11/30/2012    Procedure: INSERTION PORT-A-CATH;  Surgeon: Haywood Lasso, MD;  Location: [place];  Service: General;  Laterality: Right;     Medications: Prior to Admission medications   Medication Sig Start Date End Date Taking? Authorizing Provider  albuterol (ACCUNEB) 0.63 MG/3ML nebulizer solution Take 1 ampule by nebulization every 6 (six) hours as needed for wheezing.   Yes Historical Provider, MD  albuterol (PROVENTIL HFA;VENTOLIN HFA) 108 (90 BASE) MCG/ACT inhaler Inhale 2 puffs into the lungs every 6 (six) hours as needed for wheezing or shortness of breath.   Yes Historical Provider, MD  amLODipine (NORVASC) 10 MG tablet Take 1 tablet (10 mg total) by mouth daily. 09/28/12  Yes Biagio Borg, MD  aspirin EC 325 MG tablet Take 325 mg by mouth daily.     Yes Historical Provider, MD  azaTHIOprine (IMURAN) 50 MG tablet Take 50 mg by mouth 2 (two) times daily.     Yes  Historical Provider, MD  cinacalcet (SENSIPAR) 30 MG tablet Take 1 tablet (30 mg total) by mouth daily. 04/09/12  Yes Biagio Borg, MD  dexamethasone (DECADRON) 4 MG tablet Take 2 tablets (8 mg total) by mouth 2 (two) times daily with a meal. Take daily starting the day after chemotherapy for 2 days. Take with food. 11/23/12  Yes Deatra Robinson, MD  docusate sodium (COLACE) 100 MG capsule Take 1 capsule (100 mg total) by mouth 2 (two) times daily. 02/16/74  Yes Leighton Ruff, MD  esomeprazole (NEXIUM) 40 MG capsule Take 40 mg by mouth daily before breakfast.   Yes Historical Provider, MD  furosemide (LASIX) 80 MG tablet Take 160 mg by mouth 3 (three) times daily.    Yes Historical Provider, MD  insulin aspart (NOVOLOG) 100 UNIT/ML injection Inject 13-16 Units into the skin 3 (three) times daily. Injects 13 units daily at breakfast, 13 units daily at lunch, and 16 units daily at supper.   Yes Historical Provider, MD  insulin glargine (LANTUS) 100 UNIT/ML injection Inject 55 Units into the skin  at bedtime. 04/28/12 04/28/13 Yes Philemon Kingdom, MD  isosorbide mononitrate (IMDUR) 60 MG 24 hr tablet Take 1 tablet (60 mg total) by mouth daily. 12/28/12  Yes Biagio Borg, MD  lidocaine-prilocaine (EMLA) cream Apply 1 application topically as needed (access port for chemo). Use only on tuesdays and Thursday with chemo   Yes Historical Provider, MD  Magnesium Oxide 250 MG TABS Take 250 mg by mouth daily.    Yes Historical Provider, MD  metoprolol (LOPRESSOR) 100 MG tablet Take 1 tablet (100 mg total) by mouth 2 (two) times daily. 12/02/12  Yes Josue Hector, MD  minoxidil (LONITEN) 2.5 MG tablet Take 2.5 mg by mouth 2 (two) times daily.   Yes Historical Provider, MD  ondansetron (ZOFRAN) 8 MG tablet Take 1 tablet (8 mg total) by mouth 2 (two) times daily. Take two times a day starting the day after chemo for 2 days. Then take two times a day as needed for nausea or vomiting. 11/23/12  Yes Deatra Robinson, MD   polyethylene glycol (MIRALAX / GLYCOLAX) packet Take 17 g by mouth daily. 11/24/12  Yes Biagio Borg, MD  potassium chloride (KLOR-CON 10) 10 MEQ tablet Take 1 tablet (10 mEq total) by mouth 2 (two) times daily. 04/09/12  Yes Biagio Borg, MD  pravastatin (PRAVACHOL) 20 MG tablet Take 10 mg by mouth at bedtime.     Yes Historical Provider, MD  predniSONE (DELTASONE) 10 MG tablet Take 10 mg by mouth daily.   Yes Historical Provider, MD  tacrolimus (PROGRAF) 1 MG capsule Take 9 mg by mouth 2 (two) times daily.    Yes Historical Provider, MD  timolol (TIMOPTIC) 0.25 % ophthalmic solution Place 1 drop into the right eye 2 (two) times daily.   Yes Historical Provider, MD  traMADol (ULTRAM) 50 MG tablet Take 50 mg by mouth every 6 (six) hours as needed for pain.   Yes Historical Provider, MD  LORazepam (ATIVAN) 0.5 MG tablet Take 1 tablet (0.5 mg total) by mouth every 6 (six) hours as needed (Nausea or vomiting). 11/23/12   Deatra Robinson, MD  nitroGLYCERIN (NITROSTAT) 0.4 MG SL tablet Place 1 tablet (0.4 mg total) under the tongue every 5 (five) minutes as needed for chest pain. 09/30/11 03/22/13  Renella Cunas, MD  prednisoLONE acetate (PRED FORTE) 1 % ophthalmic suspension Place 1 drop into the left eye 3 (three) times daily.    Historical Provider, MD  prochlorperazine (COMPAZINE) 10 MG tablet Take 1 tablet (10 mg total) by mouth every 6 (six) hours as needed (Nausea or vomiting). 11/23/12   Deatra Robinson, MD    Allergies:   Allergies  Allergen Reactions  . Cephalexin Swelling    Swelling to face, chills  . Propoxyphene N-Acetaminophen Hives and Itching    Social History: Now wheelchair-bound Lives at home   reports that she has never smoked. She has never used smokeless tobacco. She reports that she does not drink alcohol or use illicit drugs.   Family History: family history includes Arthritis in her other; Diabetes in her other and other; Heart disease in her father and other;  Hypertension in her other; Thyroid disease in her other. There is no history of Kidney disease.    Physical Exam: Patient Vitals for the past 24 hrs:  BP Temp Temp src Pulse Resp SpO2 Height Weight  02/14/13 0500 124/63 mmHg - - 89 - 94 % - -  02/14/13 0345 122/67 mmHg - - 91 -  97 % - -  02/14/13 0330 118/63 mmHg - - 89 - 93 % - -  02/14/13 0315 108/68 mmHg - - 89 - 93 % - -  02/14/13 0300 105/60 mmHg - - 89 - 93 % - -  02/14/13 0230 112/61 mmHg - - 89 - 95 % - -  02/14/13 0130 105/70 mmHg - - 90 - 91 % - -  02/13/13 2221 103/48 mmHg 100 F (37.8 C) Oral 87 18 94 % 5\' 5"  (1.651 m) 78.472 kg (173 lb)    1. General:  in No Acute distress 2. Psychological: Alert and Oriented 3. Head/ENT:   Moist  Mucous Membranes                          Head Non traumatic, neck supple                          Normal  Dentition 4. SKIN: normal  Skin turgor,  Skin clean Dry and intact redness noted over left thigh, appreciate crepitus over left upper thigh 5. Heart: Regular rate and rhythm no Murmur, Rub or gallop 6. Lungs: Clear to auscultation bilaterally, no wheezes or crackles   7. Abdomen: Soft, non-tender, Non distended 8. Lower extremities: no clubbing, cyanosis, or edema 9. Neurologically Grossly intact, moving all 4 extremities equally 10. MSK: Normal range of motion  body mass index is 28.79 kg/(m^2).   Labs on Admission:   Recent Labs  02/14/13 0302  NA 135*  K 4.5  CL 94*  CO2 26  GLUCOSE 190*  BUN 26*  CREATININE 1.56*  CALCIUM 8.6   No results found for this basename: AST, ALT, ALKPHOS, BILITOT, PROT, ALBUMIN,  in the last 72 hours No results found for this basename: LIPASE, AMYLASE,  in the last 72 hours  Recent Labs  02/14/13 0127  WBC 3.6*  NEUTROABS 2.1  HGB 11.4*  HCT 34.6*  MCV 91.5  PLT 132*   No results found for this basename: CKTOTAL, CKMB, CKMBINDEX, TROPONINI,  in the last 72 hours No results found for this basename: TSH, T4TOTAL, FREET3, T3FREE,  THYROIDAB,  in the last 72 hours No results found for this basename: VITAMINB12, FOLATE, FERRITIN, TIBC, IRON, RETICCTPCT,  in the last 72 hours Lab Results  Component Value Date   HGBA1C 9.0* 08/28/2012    Estimated Creatinine Clearance: 39.2 ml/min (by C-G formula based on Cr of 1.56). ABG    Component Value Date/Time   TCO2 32 01/23/2012 1011     Lab Results  Component Value Date   DDIMER 0.34 05/04/2012     Other results:   UA no evidence of UTI  Cultures:    Component Value Date/Time   SDES URINE, CLEAN CATCH 05/04/2012 1439   SPECREQUEST NONE 05/04/2012 1439   CULT Multiple bacterial morphotypes present, none predominant. Suggest appropriate recollection if clinically indicated. 05/04/2012 1439   REPTSTATUS 05/06/2012 FINAL 05/04/2012 1439       Radiological Exams on Admission: Dg Femur Left  02/14/2013   CLINICAL DATA:  Fall with pain and swelling.  EXAM: LEFT FEMUR - 2 VIEW  COMPARISON:  Abdomen pelvis CT 02/09/2013  FINDINGS: There is an 8 cm area of subcutaneous gas in the anterior and proximal left thigh. There is no radiodense foreign body. No acute osseous findings, including fracture or osseous infection. Diffuse arterial calcification in this patient with history of diabetes.  These  results were called by telephone at the time of interpretation on 02/14/2013 at 2:56 AM to Dr. Delora Fuel , who verbally acknowledged these results.  IMPRESSION: 8 cm area of subcutaneous gas in the anterior and upper thigh. If there is no skin breech or recent procedure, findings concerning for necrotizing infection.   Electronically Signed   By: Jorje Guild M.D.   On: 02/14/2013 02:56   Ct Femur Left Wo Contrast  02/14/2013   CLINICAL DATA:  Gas in upper thigh soft tissues.  EXAM: CT OF THE LEFT FEMUR WITHOUT CONTRAST  TECHNIQUE: Axial noncontrast 2.5 mm CT images through the left hip with coronal and sagittal reformations.  COMPARISON:  Left femur radiograph February 14, 2013.  FINDINGS: As  seen on prior radiograph, there is subcutaneous emphysema along the anterolateral femoral soft tissues without subfascial extent. Associated skin thickening, and interstitial edema with thickened appearance of the fascia. Preservation the interim muscular fascia planes. No focal fluid collection. No radiopaque foreign bodies.  Dense vascular calcifications. No fracture or dislocation. No destructive bony lesions.  IMPRESSION: Left anterolateral femur subcutaneous emphysema with skin thickening and thickened fascia concerning for superficial necrotizing fasciitis, without subfascial extent. No abscess.  No acute fracture or dislocation.   Electronically Signed   By: Elon Alas   On: 02/14/2013 05:00    Chart has been reviewed  Assessment/Plan  63 year old female with history significant for breast cell carcinoma currently undergoing chemotherapy as is status post renal transplant 2008 currently on immunosuppressive medications presented with infected left upper thigh hematoma with likely superficial necrotizing fasciitis per CT  Present on Admission:  . Necrotizing fasciitis - as per Dr. Donne Hazel patient' is going to R. these a.m. n.p.o., broad-spectrum antibiotics with vancomycin and Zosyn, admit to step down . Traumatic hematoma of thigh - will need debridement as per surgery  . Breast cancer of lower-outer quadrant of right female breast - will need to notify oncology patient is here  . CAD, NATIVE VESSEL -  currently chest pain-free continue home medications and monitor  . Chronic diastolic heart failure - for now continue Lasix avoid fluid currently euvolemic  . CKD (chronic kidney disease) - spoke to renal who is aware of the patient  . Essential hypertension, benign - continue home medications but watch for hypotension  . Type 2 diabetes mellitus with circulatory disorder - decrease the Lantus to 30 units a day continue sliding scale   status post renal transplant - spoke with  nephrology who will see patient,  for now will continue her Prograf and prednisone    Prophylaxis: SCD, Protonix  CODE STATUS:FULL CODE  Other plan as per orders.  I have spent a total of 75  min on this admission, extra time taken to reassess patient coordinate care. Spoke with nephrology as well as general surgery regarding the patient   [place] 02/14/2013, 5:20 AM

## 2013-02-14 NOTE — Progress Notes (Signed)
ANTIBIOTIC CONSULT NOTE - INITIAL  Pharmacy Consult for vancomycin Indication: wound infection  Allergies  Allergen Reactions  . Cephalexin Swelling    Swelling to face, chills  . Propoxyphene N-Acetaminophen Hives and Itching    Patient Measurements: Height: 5\' 5"  (165.1 cm) Weight: 173 lb (78.472 kg) IBW/kg (Calculated) : 57   Vital Signs: Temp: 99.4 F (37.4 C) (02/01 0749) Temp src: Oral (02/01 0749) BP: 121/62 mmHg (02/01 0721) Pulse Rate: 95 (02/01 0721) Intake/Output from previous day: 01/31 0701 - 02/01 0700 In: 200 [IV Piggyback:200] Out: -  Intake/Output from this shift:    Labs:  Recent Labs  02/14/13 0127 02/14/13 0302  WBC 3.6*  --   HGB 11.4*  --   PLT 132*  --   CREATININE  --  1.56*   Estimated Creatinine Clearance: 39.2 ml/min (by C-G formula based on Cr of 1.56). No results found for this basename: Letta Median, VANCORANDOM, [place], [place], [place], [place], TOBRAPEAK, TOBRARND, AMIKACINPEAK, AMIKACINTROU, AMIKACIN,  in the last 72 hours   Microbiology: Recent Results (from the past 720 hour(s))  TECHNOLOGIST REVIEW     Status: None   Collection Time    02/02/13  8:13 AM      Result Value Range Status   Technologist Review Oc myelocyte, Oc NRBC   Final  TECHNOLOGIST REVIEW     Status: None   Collection Time    02/09/13 10:13 AM      Result Value Range Status   Technologist Review 2% metas, occ NRBC present   Final    Medical History: Past Medical History  Diagnosis Date  . Diabetes mellitus   . Acid reflux   . S/P kidney transplant   . GERD (gastroesophageal reflux disease)   . Hyperlipidemia   . DJD (degenerative joint disease)   . TIA (transient ischemic attack)   . Hx of colonic polyps   . PUD (peptic ulcer disease)   . Diabetic retinopathy   . Hemorrhoids   . Generalized headaches   . Hypertension   . Asthma   . MI (myocardial infarction) 2011  . Kidney Pham   . Mental disorder   . Stroke 2008     no deficits  . Anemia     Hx-not current  . Breast cancer   . Blind left eye   . Pneumonia   . Chronic diastolic CHF (congestive heart failure)     a. Cardiac MRI (10/2012): Moderate to severe LVH, EF 55%, chordal SAM but no LVOT gradient, mod circumferential effusion, mild LAE.  b.  Echocardiogram (11/06/12): Severe LVH, EF 57-84%, grade 1 diastolic dysfunction, moderate effusion.  Marland Kitchen Hx of cardiovascular stress test     a. Lexiscan Myoview (11/05/12): High-risk scan; fixed defect affecting the entire inferolateral and anterolateral wall; mid/apical anterior and mid/apical inferior moderate ischemia, EF 28%.  (felt to be false + as echo and cMRI with normal EF and normal WM)  . Hx of cardiac cath     a. reportedly no CAD on cardiac cath in [place]  . Wears glasses     Medications:  Prescriptions prior to admission  Medication Sig Dispense Refill  . albuterol (ACCUNEB) 0.63 MG/3ML nebulizer solution Take 1 ampule by nebulization every 6 (six) hours as needed for wheezing.      Marland Kitchen albuterol (PROVENTIL HFA;VENTOLIN HFA) 108 (90 BASE) MCG/ACT inhaler Inhale 2 puffs into the lungs every 6 (six) hours as needed for wheezing or shortness of breath.      Marland Kitchen  amLODipine (NORVASC) 10 MG tablet Take 1 tablet (10 mg total) by mouth daily.  90 tablet  3  . aspirin EC 325 MG tablet Take 325 mg by mouth daily.        Marland Kitchen azaTHIOprine (IMURAN) 50 MG tablet Take 50 mg by mouth 2 (two) times daily.        . cinacalcet (SENSIPAR) 30 MG tablet Take 1 tablet (30 mg total) by mouth daily.  30 tablet  11  . dexamethasone (DECADRON) 4 MG tablet Take 2 tablets (8 mg total) by mouth 2 (two) times daily with a meal. Take daily starting the day after chemotherapy for 2 days. Take with food.  30 tablet  1  . docusate sodium (COLACE) 100 MG capsule Take 1 capsule (100 mg total) by mouth 2 (two) times daily.  60 capsule  0  . esomeprazole (NEXIUM) 40 MG capsule Take 40 mg by mouth daily before breakfast.      .  furosemide (LASIX) 80 MG tablet Take 160 mg by mouth 3 (three) times daily.       . insulin aspart (NOVOLOG) 100 UNIT/ML injection Inject 13-16 Units into the skin 3 (three) times daily. Injects 13 units daily at breakfast, 13 units daily at lunch, and 16 units daily at supper.      . insulin glargine (LANTUS) 100 UNIT/ML injection Inject 55 Units into the skin at bedtime.      . isosorbide mononitrate (IMDUR) 60 MG 24 hr tablet Take 1 tablet (60 mg total) by mouth daily.  90 tablet  3  . lidocaine-prilocaine (EMLA) cream Apply 1 application topically as needed (access port for chemo). Use only on tuesdays and Thursday with chemo      . Magnesium Oxide 250 MG TABS Take 250 mg by mouth daily.       . metoprolol (LOPRESSOR) 100 MG tablet Take 1 tablet (100 mg total) by mouth 2 (two) times daily.  180 tablet  2  . minoxidil (LONITEN) 2.5 MG tablet Take 2.5 mg by mouth 2 (two) times daily.      . ondansetron (ZOFRAN) 8 MG tablet Take 1 tablet (8 mg total) by mouth 2 (two) times daily. Take two times a day starting the day after chemo for 2 days. Then take two times a day as needed for nausea or vomiting.  30 tablet  1  . polyethylene glycol (MIRALAX / GLYCOLAX) packet Take 17 g by mouth daily.  14 each  11  . potassium chloride (KLOR-CON 10) 10 MEQ tablet Take 1 tablet (10 mEq total) by mouth 2 (two) times daily.  90 tablet  3  . pravastatin (PRAVACHOL) 20 MG tablet Take 10 mg by mouth at bedtime.        . predniSONE (DELTASONE) 10 MG tablet Take 10 mg by mouth daily.      . tacrolimus (PROGRAF) 1 MG capsule Take 9 mg by mouth 2 (two) times daily.       . timolol (TIMOPTIC) 0.25 % ophthalmic solution Place 1 drop into the right eye 2 (two) times daily.      . traMADol (ULTRAM) 50 MG tablet Take 50 mg by mouth every 6 (six) hours as needed for pain.      Marland Kitchen LORazepam (ATIVAN) 0.5 MG tablet Take 1 tablet (0.5 mg total) by mouth every 6 (six) hours as needed (Nausea or vomiting).  30 tablet  0  .  nitroGLYCERIN (NITROSTAT) 0.4 MG SL tablet Place 1 tablet (0.4  mg total) under the tongue every 5 (five) minutes as needed for chest pain.  25 tablet  11  . prednisoLONE acetate (PRED FORTE) 1 % ophthalmic suspension Place 1 drop into the left eye 3 (three) times daily.      . prochlorperazine (COMPAZINE) 10 MG tablet Take 1 tablet (10 mg total) by mouth every 6 (six) hours as needed (Nausea or vomiting).  30 tablet  1   Scheduled:  . amLODipine  10 mg Oral Daily  . azaTHIOprine  50 mg Oral BID  . cinacalcet  30 mg Oral Daily  . docusate sodium  100 mg Oral BID  . furosemide  160 mg Oral TID  . insulin aspart  0-9 Units Subcutaneous Q4H  . insulin glargine  30 Units Subcutaneous QHS  . isosorbide mononitrate  60 mg Oral Daily  . magnesium oxide  200 mg Oral Daily  . metoprolol  100 mg Oral BID  . pantoprazole  40 mg Oral Daily  . piperacillin-tazobactam (ZOSYN)  IV  3.375 g Intravenous Q8H  . prednisoLONE acetate  1 drop Left Eye TID  . predniSONE  10 mg Oral Daily  . simvastatin  5 mg Oral q1800  . sodium chloride  3 mL Intravenous Q12H  . sodium chloride  3 mL Intravenous Q12H  . tacrolimus  9 mg Oral BID  . timolol  1 drop Right Eye BID   Assessment: Monica Lee admitted with wound infection.  Patient undergoing chemo and radiation for breast cancer.  Patient with renal transplant in 2008.  She received 1000mg  of vanc at ~0300 this morning.  Renal function is stable.  Patient to go to surgery this morning for I&D. Tmax 100.    Goal of Therapy:  Vancomycin trough level 15-20 mcg/ml  Plan:  Vanc 1000mg  Q24H Check trough when clinically appropriate Monitor renal function and clinical status  Thank you, Vivia Ewing, PharmD Clinical Pharmacist - Resident Pager: [phone] Pharmacy: [phone] 02/14/2013 8:12 AM

## 2013-02-14 NOTE — Progress Notes (Signed)
[facility] Progress Note [place] TEAM 1 - Stepdown/ICU TEAM   Monica Lee HEN:[id number] DOB: November 19, 1950 DOA: 02/14/2013 PCP: [name], MD  Brief narrative: 63 y/o female with DM, GERD, s/p renal transplant on immunosurpressives, asthma, breast cancer currently being treated, blindness in left eye, chronic diastolic CHF admitted for an abscess in her left thigh after a trauma 4 wks ago.    Subjective: Pain is controlled. No current complaints.   Assessment/Plan: Principal Problem:   Cellulitis and abscess of leg/  Traumatic hematoma of thigh - cont Vanc and Zosyn and f/u on cultures - Dilaudid (PRN)  and Hydrocodone (routine) for pain control Active Problems:    Type 2 diabetes mellitus with circulatory disorder - sugars controlled on current dose of insulin - note, patient did not receive any Lantus last night.     CAD, NATIVE VESSEL - ASA on hold- resume tomorrow?    Chronic diastolic heart failure/  CKD (chronic kidney disease) - lasix held today and IVF initiated by renal team today-  KCL on hold   s/p  KIDNEY TRANSPLANTATION - cont immunosurpressives    Breast cancer of lower-outer quadrant of right female breast - Dr Humphrey Rolls placed as consultant in [facility]-  Patient due to chemo this Thursday    Hypotension with h/o Essential hypertension, benign - cont Metoprolol with holding parameters - hold off on stress dose steroids for now  Blind Left eye  Asthma - stable  GERD - cont PPI  Code Status: Full code Family Communication:  D/w son and husband today Disposition Plan: follow in SDU today- PT eval   Consultants: Surgery nephrology  Procedures: 2/1- I and D left thigh  Antibiotics: Vancomycin- 2/1 Zosyn 2/11  DVT prophylaxis: Lovenox  Objective: Blood pressure 111/60, pulse 99, temperature 99.4 F (37.4 C), temperature source Oral, resp. rate 20, height 5\' 5"  (1.651 m), weight 73.936 kg (163 lb), SpO2 99.00%.  Intake/Output Summary  (Last 24 hours) at 02/14/13 1655 Last data filed at 02/14/13 1600  Gross per 24 hour  Intake   1285 ml  Output    780 ml  Net    505 ml     Exam: General: No acute respiratory distress. AAO x 3 Lungs: Clear to auscultation bilaterally without wheezes or crackles Cardiovascular: Regular rate and rhythm without murmur gallop or rub normal S1 and S2 Abdomen: Nontender, nondistended, soft, bowel sounds positive, no rebound, no ascites, no appreciable mass Extremities: No significant cyanosis, clubbing, or edema bilateral lower extremities- dressing not opened  Data Reviewed: Basic Metabolic Panel:  Recent Labs Lab 02/09/13 1014 02/14/13 0302  NA 134* 135*  K 4.2 4.5  CL  --  94*  CO2 31* 26  GLUCOSE 257* 190*  BUN 24.7 26*  CREATININE 1.5* 1.56*  CALCIUM 9.0 8.6   Liver Function Tests:  Recent Labs Lab 02/09/13 1014  AST 39*  ALT 19  ALKPHOS 64  BILITOT 1.39*  PROT 6.6  ALBUMIN 3.0*   No results found for this basename: LIPASE, AMYLASE,  in the last 168 hours No results found for this basename: AMMONIA,  in the last 168 hours CBC:  Recent Labs Lab 02/09/13 1013 02/14/13 0127  WBC 4.8 3.6*  NEUTROABS 4.2 2.1  HGB 11.5* 11.4*  HCT 35.1 34.6*  MCV 92.0 91.5  PLT 99* 132*   Cardiac Enzymes: No results found for this basename: CKTOTAL, CKMB, CKMBINDEX, TROPONINI,  in the last 168 hours BNP (last 3 results)  Recent Labs  11/11/12 0610 11/13/12 0440 11/15/12 0620  PROBNP 2197.0* 4098.0* 4299.0*   CBG:  Recent Labs Lab 02/14/13 0831 02/14/13 1145 02/14/13 1232 02/14/13 1532  GLUCAP 205* 171* 182* 249*    Recent Results (from the past 240 hour(s))  TECHNOLOGIST REVIEW     Status: None   Collection Time    02/09/13 10:13 AM      Result Value Range Status   Technologist Review 2% metas, occ NRBC present   Final  MRSA PCR SCREENING     Status: None   Collection Time    02/14/13  7:51 AM      Result Value Range Status   MRSA by PCR NEGATIVE   NEGATIVE Final   Comment:            The GeneXpert MRSA Assay (FDA     approved for NASAL specimens     only), is one component of a     comprehensive MRSA colonization     surveillance program. It is not     intended to diagnose MRSA     infection nor to guide or     monitor treatment for     MRSA infections.     Studies:  Recent x-ray studies have been reviewed in detail by the Attending Physician  Scheduled Meds:  Scheduled Meds: . azaTHIOprine  50 mg Oral BID  . cinacalcet  30 mg Oral Daily  . docusate sodium  100 mg Oral BID  . insulin aspart  0-9 Units Subcutaneous Q4H  . insulin glargine  30 Units Subcutaneous QHS  . isosorbide mononitrate  60 mg Oral Daily  . magnesium oxide  200 mg Oral Daily  . metoprolol  25 mg Oral BID  . pantoprazole  40 mg Oral Daily  . piperacillin-tazobactam (ZOSYN)  IV  3.375 g Intravenous Q8H  . prednisoLONE acetate  1 drop Left Eye TID  . predniSONE  10 mg Oral Daily  . simvastatin  5 mg Oral q1800  . sodium chloride  3 mL Intravenous Q12H  . sodium chloride  3 mL Intravenous Q12H  . tacrolimus  9 mg Oral BID  . timolol  1 drop Right Eye BID  . [START ON 02/15/2013] vancomycin  1,000 mg Intravenous Q24H   Continuous Infusions: . sodium chloride      Time spent on care of this patient: > 35 min   Debbe Odea, MD  [facility] Office  [phone] Pager - Text Page per Shea Evans as per below:  On-Call/Text Page:      [email or web address]      password [email or web address]  If 7PM-7AM, please contact night-coverage [email or web address] Password [email or web address] 02/14/2013, 4:55 PM   LOS: 0 days

## 2013-02-14 NOTE — ED Notes (Signed)
Monica Lee with [facility] asked that we hold pt as she would go to the OR prior to going to the floor.  Agricultural consultant notified.

## 2013-02-14 NOTE — ED Notes (Signed)
Dr. Wakefield at bedside. 

## 2013-02-14 NOTE — Op Note (Signed)
Preoperative diagnosis: Left thigh abscess  Postoperative diagnosis: Complex left thigh abscess measuring 5 x 6 cm  Procedure: Irrigation and debridement of 5 x 6 cm left thigh abscess  Surgeon: Erroll Luna M.D.  Anesthesia: LMA with 0.25% Sensorcaine local with epinephrine  EBL: 70 cc  Cultures taken  Drains: None  Indications for procedure: Patient presents to to a large left thigh abscess and concern for necrotizing fasciitis. She fell on her left thigh a couple weeks ago and had a large hematoma. This is become more painful, red and fluctuant over the last couple of days. She has a history of kidney transplant and is receiving chemotherapy for breast cancer. She is severely immunocompromised. She presents to the operating room for irrigation and debridement of this left lateral thigh abscess. Risk of bleeding, infection, nerve injury, blood vessel injury, the need for more operations, tissue loss, postoperative pain, possible limb loss, and other procedures discussed. She agrees to proceed.  Description of procedure: Patient met in holding area and examined. Chart history and physical reviewed. Questions are answered. Left thigh was marked as correct side.  Patient taken back to operating room. Placed supine on the OR room table. Left thigh prepped and draped in a sterile fashion. Timeout was done. A vertical incision was made over the left lateral thigh measuring 6 cm. This incision was then taken medially in a T shape . Large amount of pus encountered anterior sheath the musculature of the left lateral thigh. This was complex and multiple loculations that I broke up manually. No evidence of muscle necrosis or fascial necrosis. Cultures taken of the pus. Wound irrigated with saline. Packed with saline soaked gauze. Dry dressing applied. All counts the sponge, needle and this was found to be correct at this portion of the case. Patient extubated taken to recovery in satisfactory condition.

## 2013-02-14 NOTE — Anesthesia Preprocedure Evaluation (Addendum)
Anesthesia Evaluation  Patient identified by MRN, date of birth, ID band Patient awake    Airway Mallampati: II      Dental   Pulmonary asthma , pneumonia -,  breath sounds clear to auscultation        Cardiovascular hypertension, On Home Beta Blockers + CAD, + Past MI, + Peripheral Vascular Disease and +CHF Rhythm:Regular Rate:Normal     Neuro/Psych  Headaches, TIACVA    GI/Hepatic PUD, GERD-  Medicated and Controlled,  Endo/Other  diabetesHypothyroidism Hyperthyroidism   Renal/GU Renal disease     Musculoskeletal   Abdominal   Peds  Hematology  (+) anemia ,   Anesthesia Other Findings   Reproductive/Obstetrics                         Anesthesia Physical Anesthesia Plan  ASA: III  Anesthesia Plan: General   Post-op Pain Management:    Induction: Intravenous  Airway Management Planned: Oral ETT  Additional Equipment:   Intra-op Plan:   Post-operative Plan: Possible Post-op intubation/ventilation  Informed Consent: I have reviewed the patients History and Physical, chart, labs and discussed the procedure including the risks, benefits and alternatives for the proposed anesthesia with the patient or authorized representative who has indicated his/her understanding and acceptance.   Dental advisory given  Plan Discussed with: CRNA, Anesthesiologist and Surgeon  Anesthesia Plan Comments:         Anesthesia Quick Evaluation

## 2013-02-14 NOTE — Progress Notes (Signed)
ID called re: pt's thigh wound. Contact isolation not needed

## 2013-02-14 NOTE — ED Notes (Signed)
Dr. Donne Hazel phone regarding CT results.

## 2013-02-14 NOTE — ED Provider Notes (Signed)
CSN: [id number]     Arrival date & time 02/13/13  2208 History   First MD Initiated Contact with Patient 02/14/13 0128     Chief Complaint  Patient presents with  . Leg Pain   (Consider location/radiation/quality/duration/timing/severity/associated sxs/prior Treatment) Patient is a 63 y.o. female presenting with leg pain. The history is provided by the patient.  Leg Pain She fell and injured her left leg. She is uncertain of exact timing but thinks it is between 2 and four-week sig O. She saw her physician last week who sent her for a CAT scan. She is complaining of ongoing pain and swelling in the left thigh. Pain has been getting worse. She states pain is currently 6/10 but has been as severe as 8/10. Pain is worse when she tries to have bitten she's not been able to ambulate today. She has been taking tramadol for pain with inconsistent relief.  Past Medical History  Diagnosis Date  . Diabetes mellitus   . Acid reflux   . S/P kidney transplant   . GERD (gastroesophageal reflux disease)   . Hyperlipidemia   . DJD (degenerative joint disease)   . TIA (transient ischemic attack)   . Hx of colonic polyps   . PUD (peptic ulcer disease)   . Diabetic retinopathy   . Hemorrhoids   . Generalized headaches   . Hypertension   . Asthma   . MI (myocardial infarction) 2011  . Kidney [place]   . Mental disorder   . Stroke 2008    no deficits  . Anemia     Hx-not current  . Breast cancer   . Blind left eye   . Pneumonia   . Chronic diastolic CHF (congestive heart failure)     a. Cardiac MRI (10/2012): Moderate to severe LVH, EF 55%, chordal SAM but no LVOT gradient, mod circumferential effusion, mild LAE.  b.  Echocardiogram (11/06/12): Severe LVH, EF 123456, grade 1 diastolic dysfunction, moderate effusion.  Marland Kitchen Hx of cardiovascular stress test     a. Lexiscan Myoview (11/05/12): High-risk scan; fixed defect affecting the entire inferolateral and anterolateral wall; mid/apical anterior and  mid/apical inferior moderate ischemia, EF 28%.  (felt to be false + as echo and cMRI with normal EF and normal WM)  . Hx of cardiac cath     a. reportedly no CAD on cardiac cath in [place]  . Wears glasses    Past Surgical History  Procedure Laterality Date  . Transplantation renal  05/2006  . Cardiac catheterization  2011  . Abdominal hysterectomy    . Cataract extraction  bil  . Intestinal perforation surgery  01/2009  . Breast biopsy  2010  . Gallstones removed    . Cholecystectomy  2008  . Colon surgery  2013    polyps removed  . Hemorrhoid surgery  01/23/2012    Procedure: HEMORRHOIDECTOMY PROLAPSED;  Surgeon: Leighton Ruff, MD;  Location: [facility];  Service: General;  Laterality: N/A;  . Eye surgery Left 1997  . Foot surgery Right 2013  . Breast lumpectomy with sentinel lymph node biopsy Right 11/09/2012    Procedure: RIGHT BREAST LUMPECTOMY WITH SENTINEL LYMPH NODE REMOVAL;  Surgeon: Haywood Lasso, MD;  Location: [facility];  Service: General;  Laterality: Right;  . Portacath placement Right 11/30/2012    Procedure: INSERTION PORT-A-CATH;  Surgeon: Haywood Lasso, MD;  Location: [facility];  Service: General;  Laterality: Right;   Family History  Problem Relation Age of  Onset  . Kidney disease Neg Hx   . Arthritis Other     parent  . Heart disease Other     parent  . Hypertension Other     parent  . Diabetes Other     parent  . Diabetes Other     grandparent  . Thyroid disease Other     several  . Heart disease Father    History  Substance Use Topics  . Smoking status: Never Smoker   . Smokeless tobacco: Never Used  . Alcohol Use: No   OB History   Grav Para Term Preterm Abortions TAB SAB Ect Mult Living                 Review of Systems  All other systems reviewed and are negative.    Allergies  Cephalexin and Propoxyphene n-acetaminophen  Home Medications   Current Outpatient Rx  Name  Route  Sig  Dispense   Refill  . albuterol (ACCUNEB) 0.63 MG/3ML nebulizer solution   Nebulization   Take 1 ampule by nebulization every 6 (six) hours as needed for wheezing.         Marland Kitchen albuterol (PROVENTIL HFA;VENTOLIN HFA) 108 (90 BASE) MCG/ACT inhaler   Inhalation   Inhale 2 puffs into the lungs every 6 (six) hours as needed for wheezing or shortness of breath.         Marland Kitchen amLODipine (NORVASC) 10 MG tablet   Oral   Take 1 tablet (10 mg total) by mouth daily.   90 tablet   3   . aspirin EC 325 MG tablet   Oral   Take 325 mg by mouth daily.           Marland Kitchen azaTHIOprine (IMURAN) 50 MG tablet   Oral   Take 50 mg by mouth 2 (two) times daily.           . cinacalcet (SENSIPAR) 30 MG tablet   Oral   Take 1 tablet (30 mg total) by mouth daily.   30 tablet   11   . dexamethasone (DECADRON) 4 MG tablet   Oral   Take 2 tablets (8 mg total) by mouth 2 (two) times daily with a meal. Take daily starting the day after chemotherapy for 2 days. Take with food.   30 tablet   1   . docusate sodium (COLACE) 100 MG capsule   Oral   Take 1 capsule (100 mg total) by mouth 2 (two) times daily.   60 capsule   0   . esomeprazole (NEXIUM) 40 MG capsule   Oral   Take 40 mg by mouth daily before breakfast.         . furosemide (LASIX) 80 MG tablet   Oral   Take 160 mg by mouth 3 (three) times daily.          . insulin aspart (NOVOLOG) 100 UNIT/ML injection   Subcutaneous   Inject 13-16 Units into the skin 3 (three) times daily. Injects 13 units daily at breakfast, 13 units daily at lunch, and 16 units daily at supper.         . insulin glargine (LANTUS) 100 UNIT/ML injection   Subcutaneous   Inject 55 Units into the skin at bedtime.         . isosorbide mononitrate (IMDUR) 60 MG 24 hr tablet   Oral   Take 1 tablet (60 mg total) by mouth daily.   90 tablet   3   .  lidocaine-prilocaine (EMLA) cream   Topical   Apply 1 application topically as needed (access port for chemo). Use only on  tuesdays and Thursday with chemo         . Magnesium Oxide 250 MG TABS   Oral   Take 250 mg by mouth daily.          . metoprolol (LOPRESSOR) 100 MG tablet   Oral   Take 1 tablet (100 mg total) by mouth 2 (two) times daily.   180 tablet   2   . minoxidil (LONITEN) 2.5 MG tablet   Oral   Take 2.5 mg by mouth 2 (two) times daily.         . ondansetron (ZOFRAN) 8 MG tablet   Oral   Take 1 tablet (8 mg total) by mouth 2 (two) times daily. Take two times a day starting the day after chemo for 2 days. Then take two times a day as needed for nausea or vomiting.   30 tablet   1   . polyethylene glycol (MIRALAX / GLYCOLAX) packet   Oral   Take 17 g by mouth daily.   14 each   11   . potassium chloride (KLOR-CON 10) 10 MEQ tablet   Oral   Take 1 tablet (10 mEq total) by mouth 2 (two) times daily.   90 tablet   3   . pravastatin (PRAVACHOL) 20 MG tablet   Oral   Take 10 mg by mouth at bedtime.           . predniSONE (DELTASONE) 10 MG tablet   Oral   Take 10 mg by mouth daily.         . tacrolimus (PROGRAF) 1 MG capsule   Oral   Take 9 mg by mouth 2 (two) times daily.          . timolol (TIMOPTIC) 0.25 % ophthalmic solution   Right Eye   Place 1 drop into the right eye 2 (two) times daily.         . traMADol (ULTRAM) 50 MG tablet   Oral   Take 50 mg by mouth every 6 (six) hours as needed for pain.         Marland Kitchen LORazepam (ATIVAN) 0.5 MG tablet   Oral   Take 1 tablet (0.5 mg total) by mouth every 6 (six) hours as needed (Nausea or vomiting).   30 tablet   0   . nitroGLYCERIN (NITROSTAT) 0.4 MG SL tablet   Sublingual   Place 1 tablet (0.4 mg total) under the tongue every 5 (five) minutes as needed for chest pain.   25 tablet   11   . prednisoLONE acetate (PRED FORTE) 1 % ophthalmic suspension   Left Eye   Place 1 drop into the left eye 3 (three) times daily.         . prochlorperazine (COMPAZINE) 10 MG tablet   Oral   Take 1 tablet (10 mg total)  by mouth every 6 (six) hours as needed (Nausea or vomiting).   30 tablet   1    BP 103/48  Pulse 87  Temp(Src) 100 F (37.8 C) (Oral)  Resp 18  Ht 5\' 5"  (1.651 m)  Wt 173 lb (78.472 kg)  BMI 28.79 kg/m2  SpO2 94% Physical Exam  Nursing note and vitals reviewed.  63 year old female, resting comfortably and in no acute distress. Vital signs are normal although temperature is borderline at 100.Marland Kitchen Oxygen saturation  is 94%, which is normal. Head is normocephalic and atraumatic. PERRLA, EOMI. Oropharynx is clear. Neck is nontender and supple without adenopathy or JVD. Back is nontender and there is no CVA tenderness. Lungs are clear without rales, wheezes, or rhonchi. Chest is nontender. Heart has regular rate and rhythm without murmur. Abdomen is soft, flat, nontender without masses or hepatosplenomegaly and peristalsis is normoactive. Extremities: There is an extensive area of induration and swelling of the lateral aspect of the left thigh extending from the hip all the way down to the knee. It measures 35 cm x 15 cm. This area is generally tender and mildly warm to touch and does have appearance of a hematoma. Skin is warm and dry without rash. Neurologic: Mental status is normal, cranial nerves are intact, there are no motor or sensory deficits.  ED Course  Procedures (including critical care time) Labs Review Results for orders placed during the hospital encounter of 02/14/13  CBC WITH DIFFERENTIAL      Result Value Range   WBC 3.6 (*) 4.0 - 10.5 K/uL   RBC 3.78 (*) 3.87 - 5.11 MIL/uL   Hemoglobin 11.4 (*) 12.0 - 15.0 g/dL   HCT 34.6 (*) 36.0 - 46.0 %   MCV 91.5  78.0 - 100.0 fL   MCH 30.2  26.0 - 34.0 pg   MCHC 32.9  30.0 - 36.0 g/dL   RDW 16.4 (*) 11.5 - 15.5 %   Platelets 132 (*) 150 - 400 K/uL   Neutrophils Relative % 60  43 - 77 %   Neutro Abs 2.1  1.7 - 7.7 K/uL   Lymphocytes Relative 20  12 - 46 %   Lymphs Abs 0.7  0.7 - 4.0 K/uL   Monocytes Relative 20 (*) 3 -  12 %   Monocytes Absolute 0.7  0.1 - 1.0 K/uL   Eosinophils Relative 0  0 - 5 %   Eosinophils Absolute 0.0  0.0 - 0.7 K/uL   Basophils Relative 0  0 - 1 %   Basophils Absolute 0.0  0.0 - 0.1 K/uL  URINALYSIS, ROUTINE W REFLEX MICROSCOPIC      Result Value Range   Color, Urine AMBER (*) YELLOW   APPearance CLOUDY (*) CLEAR   Specific Gravity, Urine 1.023  1.005 - 1.030   pH 5.5  5.0 - 8.0   Glucose, UA 250 (*) NEGATIVE mg/dL   Hgb urine dipstick TRACE (*) NEGATIVE   Bilirubin Urine SMALL (*) NEGATIVE   Ketones, ur 15 (*) NEGATIVE mg/dL   Protein, ur 100 (*) NEGATIVE mg/dL   Urobilinogen, UA 1.0  0.0 - 1.0 mg/dL   Nitrite NEGATIVE  NEGATIVE   Leukocytes, UA NEGATIVE  NEGATIVE  BASIC METABOLIC PANEL      Result Value Range   Sodium 135 (*) 137 - 147 mEq/L   Potassium 4.5  3.7 - 5.3 mEq/L   Chloride 94 (*) 96 - 112 mEq/L   CO2 26  19 - 32 mEq/L   Glucose, Bld 190 (*) 70 - 99 mg/dL   BUN 26 (*) 6 - 23 mg/dL   Creatinine, Ser 1.56 (*) 0.50 - 1.10 mg/dL   Calcium 8.6  8.4 - 10.5 mg/dL   GFR calc non Af Amer 35 (*) >90 mL/min   GFR calc Af Amer 40 (*) >90 mL/min  SEDIMENTATION RATE      Result Value Range   Sed Rate 99 (*) 0 - 22 mm/hr  URINE MICROSCOPIC-ADD ON  Result Value Range   Squamous Epithelial / LPF FEW (*) RARE   WBC, UA 0-2  <3 WBC/hpf   RBC / HPF 3-6  <3 RBC/hpf   Bacteria, UA FEW (*) RARE   Casts HYALINE CASTS (*) NEGATIVE   Urine-Other MUCOUS PRESENT    CG4 I-STAT (LACTIC ACID)      Result Value Range   Lactic Acid, Venous 1.04  0.5 - 2.2 mmol/L   Imaging Review Dg Femur Left  02/14/2013   CLINICAL DATA:  Fall with pain and swelling.  EXAM: LEFT FEMUR - 2 VIEW  COMPARISON:  Abdomen pelvis CT 02/09/2013  FINDINGS: There is an 8 cm area of subcutaneous gas in the anterior and proximal left thigh. There is no radiodense foreign body. No acute osseous findings, including fracture or osseous infection. Diffuse arterial calcification in this patient with history of  diabetes.  These results were called by telephone at the time of interpretation on 02/14/2013 at 2:56 AM to Dr. Delora Fuel , who verbally acknowledged these results.  IMPRESSION: 8 cm area of subcutaneous gas in the anterior and upper thigh. If there is no skin breech or recent procedure, findings concerning for necrotizing infection.   Electronically Signed   By: Jorje Guild M.D.   On: 02/14/2013 02:56   Ct Femur Left Wo Contrast  02/14/2013   CLINICAL DATA:  Gas in upper thigh soft tissues.  EXAM: CT OF THE LEFT FEMUR WITHOUT CONTRAST  TECHNIQUE: Axial noncontrast 2.5 mm CT images through the left hip with coronal and sagittal reformations.  COMPARISON:  Left femur radiograph February 14, 2013.  FINDINGS: As seen on prior radiograph, there is subcutaneous emphysema along the anterolateral femoral soft tissues without subfascial extent. Associated skin thickening, and interstitial edema with thickened appearance of the fascia. Preservation the interim muscular fascia planes. No focal fluid collection. No radiopaque foreign bodies.  Dense vascular calcifications. No fracture or dislocation. No destructive bony lesions.  IMPRESSION: Left anterolateral femur subcutaneous emphysema with skin thickening and thickened fascia concerning for superficial necrotizing fasciitis, without subfascial extent. No abscess.  No acute fracture or dislocation.   Electronically Signed   By: Elon Alas   On: 02/14/2013 05:00     Images viewed by me.  CRITICAL CARE Performed by: [id number] Total critical care time: 85 minutes Critical care time was exclusive of separately billable procedures and treating other patients. Critical care was necessary to treat or prevent imminent or life-threatening deterioration. Critical care was time spent personally by me on the following activities: development of treatment plan with patient and/or surrogate as well as nursing, discussions with consultants, evaluation of patient's  response to treatment, examination of patient, obtaining history from patient or surrogate, ordering and performing treatments and interventions, ordering and review of laboratory studies, ordering and review of radiographic studies, pulse oximetry and re-evaluation of patient's condition.  MDM   1. Cellulitis of left thigh   2. CKD (chronic kidney disease)   3. Hematoma of left thigh   4. Breast cancer of lower-outer quadrant of right female breast   5. Chronic diastolic heart failure   6. Essential hypertension, benign   7. Type 2 diabetes mellitus with circulatory disorder   8. Necrotizing fasciitis    Traumatic hematoma of the left thigh. I reviewed her CT scan from January 27 and it does appear to show pain organizing hematoma. Clinically, she does not have cellulitis. Review of past records does show hemoglobin has fallen consistent with blood loss  into a hematoma. On December 30, hemoglobin was 12.9. On January 20, hemoglobin is 13.4. In general 27, hemoglobin was 11.5. Because of difficulty with ambulation, she will be sent for x-rays. It is possible to have an occult fracture which then displaces. Because of questionable low-grade fever, urinalysis will be obtained to rule out UTI. Hemoglobin will also be checked to see if there's been no further drop.  Plain x-rays show gas in the soft tissues worrisome for possible necrotizing fasciitis. Her clinical condition does not seem consistent since she does not have fever or tachycardia and does not appear toxic in any way. Case is discussed with Dr. Roel Cluck of [facility] and Dr. Donne Hazel of surgery service. She is started on antibiotics of vancomycin and Zosyn and is sent for CT to further delineate her condition. CT shows essentially the same findings as plain films. Dr. Donne Hazel has elected to admit the patient and take her directly to surgery for debridement of this area.  Delora Fuel, MD 46/27/03 5009

## 2013-02-14 NOTE — ED Notes (Signed)
Dr. Donne Hazel paged by secretary that CT results had resulted.

## 2013-02-14 NOTE — Anesthesia Procedure Notes (Signed)
Procedure Name: Intubation Date/Time: 02/14/2013 10:41 AM Performed by: Neldon Newport Pre-anesthesia Checklist: Patient identified, Timeout performed, Emergency Drugs available, Suction available and Patient being monitored Patient Re-evaluated:Patient Re-evaluated prior to inductionOxygen Delivery Method: Circle system utilized Preoxygenation: Pre-oxygenation with 100% oxygen Intubation Type: IV induction, Rapid sequence and Cricoid Pressure applied Laryngoscope size: planned glidescope. Tube size: 7.5 mm Number of attempts: 1 Airway Equipment and Method: Video-laryngoscopy Placement Confirmation: ETT inserted through vocal cords under direct vision,  positive ETCO2 and breath sounds checked- equal and bilateral Secured at: 24 cm Tube secured with: Tape Dental Injury: Teeth and Oropharynx as per pre-operative assessment

## 2013-02-14 NOTE — Transfer of Care (Signed)
Immediate Anesthesia Transfer of Care Note  Patient: Monica Lee  Procedure(s) Performed: Procedure(s): IRRIGATION AND DEBRIDEMENT LEFT THIGH ABSCESS (Left)  Patient Location: PACU  Anesthesia Type:General  Level of Consciousness: awake, alert  and oriented  Airway & Oxygen Therapy: Patient Spontanous Breathing and Patient connected to face mask oxygen  Post-op Assessment: Report given to PACU RN, Post -op Vital signs reviewed and stable and Patient moving all extremities X 4  Post vital signs: Reviewed and stable  Complications: No apparent anesthesia complications

## 2013-02-15 ENCOUNTER — Encounter (HOSPITAL_COMMUNITY): Payer: Self-pay | Admitting: Surgery

## 2013-02-15 ENCOUNTER — Other Ambulatory Visit: Payer: Medicare Other

## 2013-02-15 LAB — GLUCOSE, CAPILLARY
GLUCOSE-CAPILLARY: 200 mg/dL — AB (ref 70–99)
GLUCOSE-CAPILLARY: 99 mg/dL (ref 70–99)
Glucose-Capillary: 174 mg/dL — ABNORMAL HIGH (ref 70–99)
Glucose-Capillary: 220 mg/dL — ABNORMAL HIGH (ref 70–99)
Glucose-Capillary: 289 mg/dL — ABNORMAL HIGH (ref 70–99)

## 2013-02-15 LAB — COMPREHENSIVE METABOLIC PANEL
ALT: 16 U/L (ref 0–35)
AST: 26 U/L (ref 0–37)
Albumin: 2.2 g/dL — ABNORMAL LOW (ref 3.5–5.2)
Alkaline Phosphatase: 69 U/L (ref 39–117)
BILIRUBIN TOTAL: 0.4 mg/dL (ref 0.3–1.2)
BUN: 17 mg/dL (ref 6–23)
CHLORIDE: 95 meq/L — AB (ref 96–112)
CO2: 29 meq/L (ref 19–32)
CREATININE: 1.24 mg/dL — AB (ref 0.50–1.10)
Calcium: 8.5 mg/dL (ref 8.4–10.5)
GFR calc Af Amer: 53 mL/min — ABNORMAL LOW (ref >90)
GFR, EST NON AFRICAN AMERICAN: 46 mL/min — AB (ref >90)
Glucose, Bld: 190 mg/dL — ABNORMAL HIGH (ref 70–99)
Potassium: 3.7 mEq/L (ref 3.7–5.3)
Sodium: 136 mEq/L — ABNORMAL LOW (ref 137–147)
Total Protein: 6 g/dL (ref 6.0–8.3)

## 2013-02-15 LAB — TSH: TSH: 34.648 u[IU]/mL — ABNORMAL HIGH (ref 0.350–4.500)

## 2013-02-15 LAB — CBC
HCT: 33.3 % — ABNORMAL LOW (ref 36.0–46.0)
Hemoglobin: 10.9 g/dL — ABNORMAL LOW (ref 12.0–15.0)
MCH: 30 pg (ref 26.0–34.0)
MCHC: 32.7 g/dL (ref 30.0–36.0)
MCV: 91.7 fL (ref 78.0–100.0)
PLATELETS: ADEQUATE 10*3/uL (ref 150–400)
RBC: 3.63 MIL/uL — AB (ref 3.87–5.11)
RDW: 16.4 % — ABNORMAL HIGH (ref 11.5–15.5)
WBC: 5.2 10*3/uL (ref 4.0–10.5)

## 2013-02-15 LAB — MAGNESIUM: Magnesium: 2.2 mg/dL (ref 1.5–2.5)

## 2013-02-15 LAB — PHOSPHORUS: PHOSPHORUS: 2.7 mg/dL (ref 2.3–4.6)

## 2013-02-15 MED ORDER — INSULIN ASPART 100 UNIT/ML ~~LOC~~ SOLN
0.0000 [IU] | Freq: Three times a day (TID) | SUBCUTANEOUS | Status: DC
  Administered 2013-02-16: 2 [IU] via SUBCUTANEOUS
  Administered 2013-02-16 (×2): 3 [IU] via SUBCUTANEOUS
  Administered 2013-02-17: 1 [IU] via SUBCUTANEOUS
  Administered 2013-02-17: 2 [IU] via SUBCUTANEOUS
  Administered 2013-02-18: 1 [IU] via SUBCUTANEOUS
  Administered 2013-02-19 – 2013-02-20 (×3): 2 [IU] via SUBCUTANEOUS
  Administered 2013-02-21 – 2013-02-23 (×5): 1 [IU] via SUBCUTANEOUS
  Administered 2013-02-23: 3 [IU] via SUBCUTANEOUS

## 2013-02-15 MED ORDER — LEVOTHYROXINE SODIUM 50 MCG PO TABS
50.0000 ug | ORAL_TABLET | Freq: Every day | ORAL | Status: DC
  Administered 2013-02-16 – 2013-02-23 (×7): 50 ug via ORAL
  Filled 2013-02-15 (×11): qty 1

## 2013-02-15 MED ORDER — ASPIRIN EC 81 MG PO TBEC
81.0000 mg | DELAYED_RELEASE_TABLET | Freq: Every day | ORAL | Status: DC
  Administered 2013-02-15 – 2013-02-23 (×8): 81 mg via ORAL
  Filled 2013-02-15 (×9): qty 1

## 2013-02-15 MED ORDER — GLUCERNA SHAKE PO LIQD
237.0000 mL | Freq: Two times a day (BID) | ORAL | Status: DC
  Administered 2013-02-15 – 2013-02-21 (×10): 237 mL via ORAL
  Filled 2013-02-15 (×3): qty 237

## 2013-02-15 NOTE — Progress Notes (Addendum)
[facility] Progress Note [place] TEAM 1 - Stepdown/ICU TEAM   LLADIRA BARRINGTON [id number] DOB: [birth year] DOA: 02/14/2013 PCP: [name], MD  Brief narrative: 63 y/o female with DM, GERD, s/p renal transplant on immunosurpressives, asthma, breast cancer currently being treated, blindness in left eye, chronic diastolic CHF admitted for an abscess in her left thigh after a trauma 4 wks ago.   Subjective: Alert and pleasant.  No complaints today.  Reports that pain is controlled.  Denies n/c, abdom pain, or sob.   Assessment/Plan:  Abscess of leg / traumatic hematoma of thigh - cont Vanc and zosyn - no speciation on culture data thus far  - Dilaudid (PRN)  and Hydrocodone (routine) for pain control  Type 2 diabetes mellitus with circulatory disorder - CBGs not ideal, but variable at present - no change in tx today - follow trend   ?Newly diagnosed hypothyroidism  THS markedly elevated at ~35 - check Free T3 and Free T4 today and begin empiric synthroid in AM while waiting on results   CAD, NATIVE VESSEL - ASA was on hold for procedure - resume at low dose for now and watch for bleeding complincations   Chronic diastolic heart failure / CKD - lasix held tand IVF initiated by Renal team 2/2 - slow IVF and follow w/o lasix - BP remains marginal today   s/p  KIDNEY TRANSPLANTATION - cont immunosurpressives - renal fxn stable   Breast cancer of lower-outer quadrant of right female breast - Dr Humphrey Rolls placed as consultant in [facility] -  Patient due for chemo this Thursday  Hypotension with h/o Essential hypertension, benign - cont Metoprolol with holding parameters - hold off on stress dose steroids for now  Blind Left eye  Asthma - stable  GERD - cont PPI  Code Status: Full code Family Communication: no family present at time of exam today  Disposition Plan: transfer to med/surg bed - wound care per Gen Surg   Consultants: Gen Surgery Nephrology  Procedures: 2/1-  I and D left thigh  Antibiotics: Vancomycin 1/31 >> Zosyn 1/31 >>  DVT prophylaxis: SCDs  Objective: Blood pressure 100/61, pulse 74, temperature 97.4 F (36.3 C), temperature source Oral, resp. rate 11, height 5\' 5"  (1.651 m), weight 73.936 kg (163 lb), SpO2 100.00%.  Intake/Output Summary (Last 24 hours) at 02/15/13 1217 Last data filed at 02/15/13 1100  Gross per 24 hour  Intake   2885 ml  Output   1680 ml  Net   1205 ml   Exam: General: No acute respiratory distress Lungs: Clear to auscultation bilaterally without wheezes or crackles Cardiovascular: Regular rate and rhythm without murmur gallop or rub normal S1 and S2 Abdomen: Nontender, nondistended, soft, bowel sounds positive, no rebound, no ascites, no appreciable mass Extremities: No significant cyanosis, clubbing, or edema bilateral lower extremities- dressing not opened on L leg   Data Reviewed: Basic Metabolic Panel:  Recent Labs Lab 02/09/13 1014 02/14/13 0302 02/15/13 0317  NA 134* 135* 136*  K 4.2 4.5 3.7  CL  --  94* 95*  CO2 31* 26 29  GLUCOSE 257* 190* 190*  BUN 24.7 26* 17  CREATININE 1.5* 1.56* 1.24*  CALCIUM 9.0 8.6 8.5  MG  --   --  2.2  PHOS  --   --  2.7   Liver Function Tests:  Recent Labs Lab 02/09/13 1014 02/15/13 0317  AST 39* 26  ALT 19 16  ALKPHOS 64 69  BILITOT 1.39* 0.4  PROT  6.6 6.0  ALBUMIN 3.0* 2.2*   CBC:  Recent Labs Lab 02/09/13 1013 02/14/13 0127 02/15/13 0317  WBC 4.8 3.6* 5.2  NEUTROABS 4.2 2.1  --   HGB 11.5* 11.4* 10.9*  HCT 35.1 34.6* 33.3*  MCV 92.0 91.5 91.7  PLT 99* 132* PLATELET CLUMPS NOTED ON SMEAR, COUNT APPEARS ADEQUATE   BNP (last 3 results)  Recent Labs  11/11/12 0610 11/13/12 0440 11/15/12 0620  PROBNP 2197.0* 4098.0* 4299.0*   CBG:  Recent Labs Lab 02/14/13 0831 02/14/13 1145 02/14/13 1232 02/14/13 1532 02/14/13 1952  GLUCAP 205* 171* 182* 249* 200*    Recent Results (from the past 240 hour(s))  TECHNOLOGIST REVIEW      Status: None   Collection Time    02/09/13 10:13 AM      Result Value Range Status   Technologist Review 2% metas, occ NRBC present   Final  MRSA PCR SCREENING     Status: None   Collection Time    02/14/13  7:51 AM      Result Value Range Status   MRSA by PCR NEGATIVE  NEGATIVE Final   Comment:            The GeneXpert MRSA Assay (FDA     approved for NASAL specimens     only), is one component of a     comprehensive MRSA colonization     surveillance program. It is not     intended to diagnose MRSA     infection nor to guide or     monitor treatment for     MRSA infections.  CULTURE, ROUTINE-ABSCESS     Status: None   Collection Time    02/14/13 11:20 AM      Result Value Range Status   Specimen Description ABSCESS LEFT THIGH   Final   Special Requests NONE   Final   Gram Stain     Final   Value: RF WBC PRESENT, PREDOMINANTLY PMN     NO SQUAMOUS EPITHELIAL CELLS SEEN     ABUNDANT GRAM NEGATIVE RODS     ABUNDANT GRAM POSITIVE RODS     FEW GRAM POSITIVE COCCI IN PAIRS   Culture PENDING   Incomplete   Report Status PENDING   Incomplete     Studies:  Recent x-ray studies have been reviewed in detail by the Attending Physician  Scheduled Meds:  Scheduled Meds: . azaTHIOprine  50 mg Oral BID  . cinacalcet  30 mg Oral Daily  . docusate sodium  100 mg Oral BID  . feeding supplement (GLUCERNA SHAKE)  237 mL Oral BID BM  . insulin aspart  0-9 Units Subcutaneous Q4H  . insulin glargine  30 Units Subcutaneous QHS  . isosorbide mononitrate  60 mg Oral Daily  . magnesium oxide  200 mg Oral Daily  . metoprolol  25 mg Oral BID  . pantoprazole  40 mg Oral Daily  . piperacillin-tazobactam (ZOSYN)  IV  3.375 g Intravenous Q8H  . prednisoLONE acetate  1 drop Left Eye TID  . predniSONE  10 mg Oral Daily  . simvastatin  5 mg Oral q1800  . sodium chloride  3 mL Intravenous Q12H  . sodium chloride  3 mL Intravenous Q12H  . tacrolimus  9 mg Oral BID  . timolol  1 drop Right Eye  BID  . vancomycin  1,000 mg Intravenous Q24H    Time spent on care of this patient: 35 min   Mckennon Zwart T, MD  [facility] Office  [phone] Pager - Text Page per Shea Evans as per below:  On-Call/Text Page:      [email or web address]      password [email or web address]  If 7PM-7AM, please contact night-coverage [email or web address] Password [email or web address] 02/15/2013, 12:17 PM   LOS: 1 day

## 2013-02-15 NOTE — Evaluation (Signed)
Physical Therapy Evaluation Patient Details Name: Monica Lee MRN: [id number] DOB: 12-Mar-1950 Today's Date: 02/15/2013 Time: 8841-6606 PT Time Calculation (min): 31 min  PT Assessment / Plan / Recommendation History of Present Illness  Pt adm with Lt thigh abscess and underwent I&D 2/1. PMHX includes undergoing current chemotherapy for breast Ca, Lt eye blindness, kidney transplant '08  Clinical Impression  Patient is s/p Lt thigh I&D surgery resulting in functional limitations due to the deficits listed below (see PT Problem List). Pt with good family support. Patient will benefit from skilled PT to increase their independence and safety with mobility to allow discharge to the venue listed below.       PT Assessment  Patient needs continued PT services    Follow Up Recommendations  Home health PT;Supervision for mobility/OOB    Does the patient have the potential to tolerate intense rehabilitation      Barriers to Discharge        Equipment Recommendations  Rolling walker with 5" wheels    Recommendations for Other Services     Frequency Min 3X/week    Precautions / Restrictions Precautions Precautions: Fall Precaution Comments: pt reports fell 4 weeks PTA "my Rt leg just gave out with no warning"   Pertinent Vitals/Pain SaO2 100% on 2L 97% on RA at rest 86-89% on RA with walking 94% at rest on 2L  Pain 6.5/10 Lt thigh--remained constant throughout session       Mobility  Bed Mobility Overal bed mobility: Needs Assistance Bed Mobility: Supine to Sit Supine to sit: Min assist;HOB elevated General bed mobility comments: assist to raise torso to sitting Transfers Overall transfer level: Needs assistance Transfers: Sit to/from Stand Sit to Stand: Min guard Ambulation/Gait Ambulation/Gait assistance: Min assist Ambulation Distance (Feet): 80 Feet Assistive device: Rolling walker (2 wheeled) Gait Pattern/deviations: Step-through pattern;Decreased stride  length General Gait Details: assist to maneuver RW through tight spaces (running into objects on her Rt and frequently as her Lt).    Exercises     PT Diagnosis: Difficulty walking;Acute pain  PT Problem List: Decreased activity tolerance;Decreased balance;Decreased mobility;Decreased knowledge of use of DME;Cardiopulmonary status limiting activity;Pain PT Treatment Interventions: DME instruction;Gait training;Stair training;Functional mobility training;Therapeutic activities;Therapeutic exercise;Patient/family education     PT Goals(Current goals can be found in the care plan section) Acute Rehab PT Goals Patient Stated Goal: return home PT Goal Formulation: With patient Time For Goal Achievement: 02/22/13 Potential to Achieve Goals: Good  Visit Information  Last PT Received On: 02/15/13 Assistance Needed: +1 History of Present Illness: Pt adm with Lt thigh abscess and underwent I&D 2/1. PMHX includes undergoing current chemotherapy for breast Ca, Lt eye blindness, kidney transplant '08       Prior [place] expects to be discharged to:: Private residence Living Arrangements: Spouse/significant other Available Help at Discharge: Family;Available 24 hours/day Type of Home: House Home Access: Stairs to enter [employer] of Steps: 1 Entrance Stairs-Rails: None Home Layout: Laundry or work area in [place]: Kasandra Knudsen - single point;Other (comment) (exercise bicycle) Prior Function Level of Independence: Independent with assistive device(s) Comments: used cane PTA due to wooziness from chemotherapy (began using Nov 2014) Communication Communication: No difficulties    Cognition  Cognition Arousal/Alertness: Awake/alert Behavior During Therapy: WFL for tasks assessed/performed Overall Cognitive Status: Within Functional Limits for tasks assessed    Extremity/Trunk Assessment Upper Extremity Assessment Upper Extremity  Assessment: Overall WFL for tasks assessed Lower Extremity Assessment Lower Extremity Assessment: LLE deficits/detail (  Rt knee extension 4+) LLE Deficits / Details: somewhat limited by pain;  Cervical / Trunk Assessment Cervical / Trunk Assessment: Normal   Balance General Comments General comments (skin integrity, edema, etc.): pt very motivated. Reports she does not know what caused her Rt leg to buckle and fall 4 weeks ago. "Came out of nowhere." Open to idea of using a RW for incr safety  End of Session PT - End of Session Equipment Utilized During Treatment: Gait belt Activity Tolerance: Treatment limited secondary to medical complications (Comment) (decr SaO2) Patient left: in chair;with call bell/phone within reach Nurse Communication: Mobility status;Other (comment) (decr SaO2 on RA)  GP     Cait Locust 02/15/2013, 12:48 PM Pager [phone]

## 2013-02-15 NOTE — Progress Notes (Signed)
INITIAL NUTRITION ASSESSMENT  DOCUMENTATION CODES Per approved criteria  -Not Applicable   INTERVENTION: - Glucerna Shake po BID, each supplement provides 220 kcal and 10 grams of protein - Recommend that pt be re-weighed, pt and RD suspect wt in chart is inaccurate.   NUTRITION DIAGNOSIS: Inadequate oral intake related to infection and pain in left thigh as evidenced by reported intake less than estimated needs.   Goal: Pt to meet >/= 90% of their estimated nutrition needs   Monitor:  Wt, po intake, acceptance of supplements, labs  Reason for Assessment: MST  63 y.o. female  Admitting Dx: Cellulitis and abscess of leg  ASSESSMENT: 63 y.o. female has a past medical history of Diabetes mellitus; Acid reflux; S/P kidney transplant; GERD (gastroesophageal reflux disease); Hyperlipidemia; DJD (degenerative joint disease); TIA (transient ischemic attack); colonic polyps; PUD (peptic ulcer disease); Diabetic retinopathy; Hemorrhoids; Generalized headaches; Hypertension; Asthma; MI (myocardial infarction) (2011); Kidney [place]; Mental disorder; Stroke (2008); Anemia; Breast cancer; Blind left eye; Pneumonia; Chronic diastolic CHF (congestive heart failure); cardiovascular stress test; cardiac cath; and Wears glasses.   Pt admitted with pain in her left thigh.  Pt reports that her intake has been poor for the past several days due to pain. Pt records her weight daily which RD observed in a folder that pt brought from home. Pt weighed 193 lbs on 1/29. Suspect that wt recorded in chart (163 lbs) is incorrect. Pt is currently tolerating her diet, but reports getting tired when eating and being unable to complete meals. Pt would like to have Glucerna Shakes sent to ensure she is getting enough calories and protein. Pt is currently undergoing chemotherapy for breast cancer.    Height: Ht Readings from Last 1 Encounters:  02/14/13 5\' 5"  (1.651 m)    Weight: Wt Readings from Last 1 Encounters:   02/14/13 163 lb (73.936 kg)    Ideal Body Weight: 57 kg  % Ideal Body Weight: 154% (based on wt 1/29- 193 lbs)  Wt Readings from Last 10 Encounters:  02/14/13 163 lb (73.936 kg)  02/14/13 163 lb (73.936 kg)  02/09/13 193 lb 4.8 oz (87.68 kg)  02/02/13 199 lb (90.266 kg)  01/28/13 199 lb (90.266 kg)  01/12/13 195 lb 1.6 oz (88.497 kg)  01/11/13 194 lb (87.998 kg)  12/25/12 197 lb (89.359 kg)  12/15/12 203 lb 6 oz (92.25 kg)  11/25/12 194 lb (87.998 kg)    Usual Body Weight: 193 lbs  % Usual Body Weight: suspect wt recorded in chart is incorrect  BMI:  Body mass index is 27.12 kg/(m^2).  Estimated Nutritional Needs: Kcal: 8416-6063 Protein: 105-115 g Fluid: 2.2-2.4 L/day  Skin: incision on left thigh  Diet Order: General  EDUCATION NEEDS: -Education needs addressed   Intake/Output Summary (Last 24 hours) at 02/15/13 1035 Last data filed at 02/15/13 0900  Gross per 24 hour  Intake   3435 ml  Output   1605 ml  Net   1830 ml    Last BM: none recorded   Labs:   Recent Labs Lab 02/09/13 1014 02/14/13 0302 02/15/13 0317  NA 134* 135* 136*  K 4.2 4.5 3.7  CL  --  94* 95*  CO2 31* 26 29  BUN 24.7 26* 17  CREATININE 1.5* 1.56* 1.24*  CALCIUM 9.0 8.6 8.5  MG  --   --  2.2  PHOS  --   --  2.7  GLUCOSE 257* 190* 190*    CBG (last 3)   Recent Labs  02/14/13 1232 02/14/13 1532 02/14/13 1952  GLUCAP 182* 249* 200*    Scheduled Meds: . azaTHIOprine  50 mg Oral BID  . cinacalcet  30 mg Oral Daily  . docusate sodium  100 mg Oral BID  . insulin aspart  0-9 Units Subcutaneous Q4H  . insulin glargine  30 Units Subcutaneous QHS  . isosorbide mononitrate  60 mg Oral Daily  . magnesium oxide  200 mg Oral Daily  . metoprolol  25 mg Oral BID  . pantoprazole  40 mg Oral Daily  . piperacillin-tazobactam (ZOSYN)  IV  3.375 g Intravenous Q8H  . prednisoLONE acetate  1 drop Left Eye TID  . predniSONE  10 mg Oral Daily  . simvastatin  5 mg Oral q1800  .  sodium chloride  3 mL Intravenous Q12H  . sodium chloride  3 mL Intravenous Q12H  . tacrolimus  9 mg Oral BID  . timolol  1 drop Right Eye BID  . vancomycin  1,000 mg Intravenous Q24H    Continuous Infusions: . sodium chloride 75 mL/hr at 02/14/13 1714    Past Medical History  Diagnosis Date  . Diabetes mellitus   . Acid reflux   . S/P kidney transplant   . GERD (gastroesophageal reflux disease)   . Hyperlipidemia   . DJD (degenerative joint disease)   . TIA (transient ischemic attack)   . Hx of colonic polyps   . PUD (peptic ulcer disease)   . Diabetic retinopathy   . Hemorrhoids   . Generalized headaches   . Hypertension   . Asthma   . MI (myocardial infarction) 2011  . Kidney [place]   . Mental disorder   . Stroke 2008    no deficits  . Anemia     Hx-not current  . Breast cancer   . Blind left eye   . Pneumonia   . Chronic diastolic CHF (congestive heart failure)     a. Cardiac MRI (10/2012): Moderate to severe LVH, EF 55%, chordal SAM but no LVOT gradient, mod circumferential effusion, mild LAE.  b.  Echocardiogram (11/06/12): Severe LVH, EF 37-16%, grade 1 diastolic dysfunction, moderate effusion.  Marland Kitchen Hx of cardiovascular stress test     a. Lexiscan Myoview (11/05/12): High-risk scan; fixed defect affecting the entire inferolateral and anterolateral wall; mid/apical anterior and mid/apical inferior moderate ischemia, EF 28%.  (felt to be false + as echo and cMRI with normal EF and normal WM)  . Hx of cardiac cath     a. reportedly no CAD on cardiac cath in [place]  . Wears glasses     Past Surgical History  Procedure Laterality Date  . Transplantation renal  05/2006  . Cardiac catheterization  2011  . Abdominal hysterectomy    . Cataract extraction  bil  . Intestinal perforation surgery  01/2009  . Breast biopsy  2010  . Gallstones removed    . Cholecystectomy  2008  . Colon surgery  2013    polyps removed  . Hemorrhoid surgery  01/23/2012    Procedure:  HEMORRHOIDECTOMY PROLAPSED;  Surgeon: Leighton Ruff, MD;  Location: [facility];  Service: General;  Laterality: N/A;  . Eye surgery Left 1997  . Foot surgery Right 2013  . Breast lumpectomy with sentinel lymph node biopsy Right 11/09/2012    Procedure: RIGHT BREAST LUMPECTOMY WITH SENTINEL LYMPH NODE REMOVAL;  Surgeon: Haywood Lasso, MD;  Location: [place];  Service: General;  Laterality: Right;  . Portacath placement Right 11/30/2012  Procedure: INSERTION PORT-A-CATH;  Surgeon: Haywood Lasso, MD;  Location: [facility];  Service: General;  Laterality: Right;    Terrace Arabia RD, LDN

## 2013-02-15 NOTE — Progress Notes (Signed)
1 Day Post-Op  Subjective: Eating doing fine  Objective: Vital signs in last 24 hours: Temp:  [97.4 F (36.3 C)-99.4 F (37.4 C)] 97.4 F (36.3 C) (02/02 0856) Pulse Rate:  [66-104] 71 (02/02 0800) Resp:  [11-28] 11 (02/02 0800) BP: (90-157)/(47-107) 101/48 mmHg (02/02 0800) SpO2:  [92 %-100 %] 100 % (02/02 0800)    Intake/Output from previous day: 02/01 0701 - 02/02 0700 In: 3285 [P.O.:1080; I.V.:1855; IV Piggyback:350] Out: 1575 [Urine:1575] Intake/Output this shift: Total I/O In: 75 [I.V.:75] Out: 30 [Urine:30]  left thigh dressing intact  Lab Results:   Recent Labs  02/14/13 0127 02/15/13 0317  WBC 3.6* 5.2  HGB 11.4* 10.9*  HCT 34.6* 33.3*  PLT 132* PLATELET CLUMPS NOTED ON SMEAR, COUNT APPEARS ADEQUATE   BMET  Recent Labs  02/14/13 0302 02/15/13 0317  NA 135* 136*  K 4.5 3.7  CL 94* 95*  CO2 26 29  GLUCOSE 190* 190*  BUN 26* 17  CREATININE 1.56* 1.24*  CALCIUM 8.6 8.5   PT/INR No results found for this basename: LABPROT, INR,  in the last 72 hours ABG No results found for this basename: PHART, PCO2, PO2, HCO3,  in the last 72 hours  Studies/Results: Dg Femur Left  02/14/2013   CLINICAL DATA:  Fall with pain and swelling.  EXAM: LEFT FEMUR - 2 VIEW  COMPARISON:  Abdomen pelvis CT 02/09/2013  FINDINGS: There is an 8 cm area of subcutaneous gas in the anterior and proximal left thigh. There is no radiodense foreign body. No acute osseous findings, including fracture or osseous infection. Diffuse arterial calcification in this patient with history of diabetes.  These results were called by telephone at the time of interpretation on 02/14/2013 at 2:56 AM to Dr. Delora Fuel , who verbally acknowledged these results.  IMPRESSION: 8 cm area of subcutaneous gas in the anterior and upper thigh. If there is no skin breech or recent procedure, findings concerning for necrotizing infection.   Electronically Signed   By: Jorje Guild M.D.   On: 02/14/2013 02:56    Ct Femur Left Wo Contrast  02/14/2013   CLINICAL DATA:  Gas in upper thigh soft tissues.  EXAM: CT OF THE LEFT FEMUR WITHOUT CONTRAST  TECHNIQUE: Axial noncontrast 2.5 mm CT images through the left hip with coronal and sagittal reformations.  COMPARISON:  Left femur radiograph February 14, 2013.  FINDINGS: As seen on prior radiograph, there is subcutaneous emphysema along the anterolateral femoral soft tissues without subfascial extent. Associated skin thickening, and interstitial edema with thickened appearance of the fascia. Preservation the interim muscular fascia planes. No focal fluid collection. No radiopaque foreign bodies.  Dense vascular calcifications. No fracture or dislocation. No destructive bony lesions.  IMPRESSION: Left anterolateral femur subcutaneous emphysema with skin thickening and thickened fascia concerning for superficial necrotizing fasciitis, without subfascial extent. No abscess.  No acute fracture or dislocation.   Electronically Signed   By: Elon Alas   On: 02/14/2013 05:00    Anti-infectives: Anti-infectives   Start     Dose/Rate Route Frequency Ordered Stop   02/15/13 0200  vancomycin (VANCOCIN) IVPB 1000 mg/200 mL premix     1,000 mg 200 mL/hr over 60 Minutes Intravenous Every 24 hours 02/14/13 0814     02/14/13 1400  piperacillin-tazobactam (ZOSYN) IVPB 3.375 g     3.375 g 12.5 mL/hr over 240 Minutes Intravenous 3 times per day 02/14/13 0754     02/14/13 0515  piperacillin-tazobactam (ZOSYN) IVPB 3.375 g  3.375 g 100 mL/hr over 30 Minutes Intravenous  Once 02/14/13 0508 02/14/13 0638   02/14/13 0300  vancomycin (VANCOCIN) IVPB 1000 mg/200 mL premix     1,000 mg 200 mL/hr over 60 Minutes Intravenous  Once 02/14/13 0248 02/14/13 0443      Assessment/Plan: Pod 1 left thigh abscess drainage Cont abx Dressing change later today  [facility] 02/15/2013

## 2013-02-15 NOTE — Progress Notes (Signed)
Inpatient Diabetes Program Recommendations  AACE/ADA: New Consensus Statement on Inpatient Glycemic Control (2013)  Target Ranges:  Prepandial:   less than 140 mg/dL      Peak postprandial:   less than 180 mg/dL (1-2 hours)      Critically ill patients:  140 - 180 mg/dL   Reason for Assessment: Hyperglycemia  Outpatient Diabetes medications: Lantus 55 unit @ HS and Novolog 13-16 units tid as meal coverage Current orders for Inpatient glycemic control: Lantus 30 units and sensitive correction Novolog  Request MD consider adding 4 units Novolog meal coverage to be give tid with meals in addition to correction as long as patient eats at least 50% and CBG is at least 80 mg/dl.  Thank you.  Horst Ostermiller S. Marcelline Mates, RN, CNS, CDE Inpatient Diabetes Program, team pager [phone]

## 2013-02-15 NOTE — Progress Notes (Signed)
Patient ID: Monica Lee, female   DOB: 03-10-1950, 63 y.o.   MRN: [id number] 1 Day Post-Op  Subjective: Pt feels ok.  Leg feels better today  Objective: Vital signs in last 24 hours: Temp:  [97.4 F (36.3 C)-99.4 F (37.4 C)] 97.4 F (36.3 C) (02/02 0856) Pulse Rate:  [66-104] 78 (02/02 0900) Resp:  [11-28] 15 (02/02 0900) BP: (90-157)/(47-107) 111/68 mmHg (02/02 0900) SpO2:  [92 %-100 %] 94 % (02/02 0900)    Intake/Output from previous day: 02/01 0701 - 02/02 0700 In: 3285 [P.O.:1080; I.V.:1855; IV Piggyback:350] Out: 6546 [Urine:1575] Intake/Output this shift: Total I/O In: 150 [I.V.:150] Out: 30 [Urine:30]  PE: Skin: left thigh wound is clean.  Dressing change done.  Still with cellulitis and induration around wound and tracking down lateral portion of thigh.  Lab Results:   Recent Labs  02/14/13 0127 02/15/13 0317  WBC 3.6* 5.2  HGB 11.4* 10.9*  HCT 34.6* 33.3*  PLT 132* PLATELET CLUMPS NOTED ON SMEAR, COUNT APPEARS ADEQUATE   BMET  Recent Labs  02/14/13 0302 02/15/13 0317  NA 135* 136*  K 4.5 3.7  CL 94* 95*  CO2 26 29  GLUCOSE 190* 190*  BUN 26* 17  CREATININE 1.56* 1.24*  CALCIUM 8.6 8.5   PT/INR No results found for this basename: LABPROT, INR,  in the last 72 hours CMP     Component Value Date/Time   NA 136* 02/15/2013 0317   NA 134* 02/09/2013 1014   K 3.7 02/15/2013 0317   K 4.2 02/09/2013 1014   CL 95* 02/15/2013 0317   CO2 29 02/15/2013 0317   CO2 31* 02/09/2013 1014   GLUCOSE 190* 02/15/2013 0317   GLUCOSE 257* 02/09/2013 1014   BUN 17 02/15/2013 0317   BUN 24.7 02/09/2013 1014   CREATININE 1.24* 02/15/2013 0317   CREATININE 1.5* 02/09/2013 1014   CALCIUM 8.5 02/15/2013 0317   CALCIUM 9.0 02/09/2013 1014   PROT 6.0 02/15/2013 0317   PROT 6.6 02/09/2013 1014   ALBUMIN 2.2* 02/15/2013 0317   ALBUMIN 3.0* 02/09/2013 1014   AST 26 02/15/2013 0317   AST 39* 02/09/2013 1014   ALT 16 02/15/2013 0317   ALT 19 02/09/2013 1014   ALKPHOS 69 02/15/2013 0317   ALKPHOS  64 02/09/2013 1014   BILITOT 0.4 02/15/2013 0317   BILITOT 1.39* 02/09/2013 1014   GFRNONAA 46* 02/15/2013 0317   GFRAA 53* 02/15/2013 0317   Lipase     Component Value Date/Time   LIPASE 22 06/21/2009 1722       Studies/Results: Dg Femur Left  02/14/2013   CLINICAL DATA:  Fall with pain and swelling.  EXAM: LEFT FEMUR - 2 VIEW  COMPARISON:  Abdomen pelvis CT 02/09/2013  FINDINGS: There is an 8 cm area of subcutaneous gas in the anterior and proximal left thigh. There is no radiodense foreign body. No acute osseous findings, including fracture or osseous infection. Diffuse arterial calcification in this patient with history of diabetes.  These results were called by telephone at the time of interpretation on 02/14/2013 at 2:56 AM to Dr. Delora Fuel , who verbally acknowledged these results.  IMPRESSION: 8 cm area of subcutaneous gas in the anterior and upper thigh. If there is no skin breech or recent procedure, findings concerning for necrotizing infection.   Electronically Signed   By: Jorje Guild M.D.   On: 02/14/2013 02:56   Ct Femur Left Wo Contrast  02/14/2013   CLINICAL DATA:  Gas in upper  thigh soft tissues.  EXAM: CT OF THE LEFT FEMUR WITHOUT CONTRAST  TECHNIQUE: Axial noncontrast 2.5 mm CT images through the left hip with coronal and sagittal reformations.  COMPARISON:  Left femur radiograph February 14, 2013.  FINDINGS: As seen on prior radiograph, there is subcutaneous emphysema along the anterolateral femoral soft tissues without subfascial extent. Associated skin thickening, and interstitial edema with thickened appearance of the fascia. Preservation the interim muscular fascia planes. No focal fluid collection. No radiopaque foreign bodies.  Dense vascular calcifications. No fracture or dislocation. No destructive bony lesions.  IMPRESSION: Left anterolateral femur subcutaneous emphysema with skin thickening and thickened fascia concerning for superficial necrotizing fasciitis, without  subfascial extent. No abscess.  No acute fracture or dislocation.   Electronically Signed   By: Elon Alas   On: 02/14/2013 05:00    Anti-infectives: Anti-infectives   Start     Dose/Rate Route Frequency Ordered Stop   02/15/13 0200  vancomycin (VANCOCIN) IVPB 1000 mg/200 mL premix     1,000 mg 200 mL/hr over 60 Minutes Intravenous Every 24 hours 02/14/13 0814     02/14/13 1400  piperacillin-tazobactam (ZOSYN) IVPB 3.375 g     3.375 g 12.5 mL/hr over 240 Minutes Intravenous 3 times per day 02/14/13 0754     02/14/13 0515  piperacillin-tazobactam (ZOSYN) IVPB 3.375 g     3.375 g 100 mL/hr over 30 Minutes Intravenous  Once 02/14/13 0508 02/14/13 0638   02/14/13 0300  vancomycin (VANCOCIN) IVPB 1000 mg/200 mL premix     1,000 mg 200 mL/hr over 60 Minutes Intravenous  Once 02/14/13 0248 02/14/13 0443       Assessment/Plan  1. POD 1, s/p incision and drainage of left thigh infected hematoma Patient Active Problem List   Diagnosis Date Noted  . Traumatic hematoma of thigh 02/14/2013  . Cellulitis and abscess of leg 02/14/2013  . Essential hypertension, benign 11/19/2012  . S/P kidney transplant 11/19/2012  . CKD (chronic kidney disease) 11/19/2012  . Pericardial effusion 11/19/2012  . Hypothyroidism 11/13/2012  . Acute on chronic diastolic heart failure 57/32/2025  . Acute respiratory failure 11/11/2012  . Breast cancer of lower-outer quadrant of right female breast 10/14/2012  . Olecranon bursitis of right elbow 10/11/2012  . Right shoulder pain 04/09/2012  . Asthma exacerbation 12/28/2011  . Cellulitis, abdominal wall 12/28/2011  . Hemorrhoids 12/05/2011  . Preventative health care 12/29/2010  . Hyperthyroidism 11/27/2010  . Multinodular goiter 11/17/2010  . Chronic diastolic heart failure 42/70/6237  . DIABETIC  RETINOPATHY 11/14/2009  . HYPERLIPIDEMIA 11/14/2009  . ANEMIA-NOS 11/14/2009  . INCREASED INTRAOCULAR PRESSURE 11/14/2009  . MYOCARDIAL INFARCTION, HX OF  11/14/2009  . CORONARY ARTERY DISEASE 11/14/2009  . PEPTIC ULCER DISEASE 11/14/2009  . Gastroparesis 11/14/2009  . LOW BACK PAIN, CHRONIC 11/14/2009  . TRANSIENT ISCHEMIC ATTACK, HX OF 11/14/2009  . COLONIC POLYPS, HX OF 11/14/2009  . Edema 10/03/2009  . Type 2 diabetes mellitus with circulatory disorder 07/14/2009  . Hypertensive heart disease 07/14/2009  . MYOCARDIAL INFARCTION, ACUTE, SUBENDOCARDIAL 07/14/2009  . CAD, NATIVE VESSEL 07/14/2009  . ASTHMA 07/14/2009  . GERD 07/14/2009  . NEPHROLITHIASIS, HX OF 07/14/2009  . KIDNEY TRANSPLANTATION 07/14/2009   Plan: 1. Will start NS WD dressing changes to thigh BID 2. Cont IV abx therapy 3. Will follow   LOS: 1 day    Karin Pinedo E 02/15/2013, 10:40 AM Pager: [phone]

## 2013-02-15 NOTE — Progress Notes (Signed)
Transferred to 4P73 via wheelchair. Portable oxygen on. No changes. Report given to Remo Lipps, [place]

## 2013-02-15 NOTE — Progress Notes (Signed)
Admit: 02/14/2013 LOS: 1  27F s/p kidney tranpsplant 2008 (BL Scr 1.5), followed by [place], on Tac/Pred/Aza admitted with L thigh hematoma/abscess s/p I&D 02/14/13.    Subjective:  Afebrile  Eating ok Pain ok Bp remains on low side of normal No complaints Off her minoxidil, furosemide, amlodipione; MTP cut back  02/01 0701 - 02/02 0700 In: 9323 [P.O.:1080; I.V.:1855; IV Piggyback:350] Out: 5573 [UKGUR:4270]  Filed Weights   02/13/13 2221 02/14/13 0800 02/14/13 0807  Weight: 78.472 kg (173 lb) 73.936 kg (163 lb) 73.936 kg (163 lb)    Current meds: reviewed including Vanc/Zosyn, azathioprine 50BID, Pred 10daily, tacrolimus 9 BID Current Labs: reviewed    Physical Exam:  Blood pressure 101/48, pulse 71, temperature 97.4 F (36.3 C), temperature source Oral, resp. rate 11, height 5\' 5"  (1.651 m), weight 73.936 kg (163 lb), SpO2 100.00%. NAD RRR, nl s1s2 CTAB S/nt/nd nabs No LEE b/l L thigh bandaged, clean anddry Nonfocal, aaox3 NCAT L eye closed  Assessment/Plan 1. S/p KT 2008: Cont on outpt immunosuppressives.  Graft function at baseline, good UOP.   2. HTN: agree with held BP meds in acute setting, BP remains low.  Unclear if fall 1 mo ago was related to orthostasis or mechanical.  Restart meds (minoxidil last, if at all) for BPs consistelty > 140 SBP.  Might not need such a hefty dose of furosemide with no minoxidil 3. L Thigh abscess: per surgery and TRH  Will sign off for now,  Pt will need f/u with Dr. Jimmy Footman within a month or two of discharge.   Pearson Grippe MD 02/15/2013, 9:06 AM   Recent Labs Lab 02/09/13 1014 02/14/13 0302 02/15/13 0317  NA 134* 135* 136*  K 4.2 4.5 3.7  CL  --  94* 95*  CO2 31* 26 29  GLUCOSE 257* 190* 190*  BUN 24.7 26* 17  CREATININE 1.5* 1.56* 1.24*  CALCIUM 9.0 8.6 8.5  PHOS  --   --  2.7    Recent Labs Lab 02/09/13 1013 02/14/13 0127 02/15/13 0317  WBC 4.8 3.6* 5.2  NEUTROABS 4.2 2.1  --   HGB 11.5* 11.4* 10.9*   HCT 35.1 34.6* 33.3*  MCV 92.0 91.5 91.7  PLT 99* 132* PLATELET CLUMPS NOTED ON SMEAR, COUNT APPEARS ADEQUATE

## 2013-02-16 DIAGNOSIS — K219 Gastro-esophageal reflux disease without esophagitis: Secondary | ICD-10-CM

## 2013-02-16 DIAGNOSIS — N189 Chronic kidney disease, unspecified: Secondary | ICD-10-CM

## 2013-02-16 DIAGNOSIS — E039 Hypothyroidism, unspecified: Secondary | ICD-10-CM

## 2013-02-16 DIAGNOSIS — D649 Anemia, unspecified: Secondary | ICD-10-CM

## 2013-02-16 LAB — GLUCOSE, CAPILLARY
GLUCOSE-CAPILLARY: 178 mg/dL — AB (ref 70–99)
GLUCOSE-CAPILLARY: 280 mg/dL — AB (ref 70–99)
Glucose-Capillary: 159 mg/dL — ABNORMAL HIGH (ref 70–99)
Glucose-Capillary: 188 mg/dL — ABNORMAL HIGH (ref 70–99)
Glucose-Capillary: 240 mg/dL — ABNORMAL HIGH (ref 70–99)
Glucose-Capillary: 243 mg/dL — ABNORMAL HIGH (ref 70–99)

## 2013-02-16 LAB — T3, FREE: T3 FREE: 1.6 pg/mL — AB (ref 2.3–4.2)

## 2013-02-16 LAB — T4, FREE: Free T4: 0.38 ng/dL — ABNORMAL LOW (ref 0.80–1.80)

## 2013-02-16 MED ORDER — INSULIN GLARGINE 100 UNIT/ML ~~LOC~~ SOLN
40.0000 [IU] | Freq: Every day | SUBCUTANEOUS | Status: DC
  Administered 2013-02-16 – 2013-02-22 (×7): 40 [IU] via SUBCUTANEOUS
  Filled 2013-02-16 (×8): qty 0.4

## 2013-02-16 MED ORDER — POLYETHYLENE GLYCOL 3350 17 G PO PACK
17.0000 g | PACK | Freq: Every day | ORAL | Status: DC
  Administered 2013-02-16 – 2013-02-23 (×6): 17 g via ORAL
  Filled 2013-02-16 (×9): qty 1

## 2013-02-16 MED ORDER — INSULIN ASPART 100 UNIT/ML ~~LOC~~ SOLN
4.0000 [IU] | Freq: Three times a day (TID) | SUBCUTANEOUS | Status: DC
  Administered 2013-02-16 – 2013-02-23 (×16): 4 [IU] via SUBCUTANEOUS

## 2013-02-16 NOTE — Progress Notes (Addendum)
[facility]                                                                              Patient Demographics  Monica Lee, is a 63 y.o. female, DOB - [birth year], KZS:[id number]  Admit date - 02/14/2013   Admitting Physician Toy Baker, MD  Outpatient Primary MD for the patient is [name], MD  LOS - 2   Chief Complaint  Patient presents with  . 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  Surgery may consider wound vac in the next few days.   Assessment & Plan  Abscess of leg / traumatic hematoma of thigh  -continue Vanc and zosyn - no speciation on culture data thus far  -Continue Dilaudid (PRN) and Hydrocodone (routine) for pain control  -Continue dressing changes and monitoring, may consider wound vac in the next 48 hours (per surgery)  Type 2 diabetes mellitus with circulatory disorder  -Diabetes coordinator following -Will increase Lantus to 40 units QHS, Novolog 4units TID with meals -Will continue to monitor  ?Newly diagnosed hypothyroidism  -TSH markedly elevated at ~35  -FT4 0.38, FT3 1.6 -Continue Synthroid -Will need outpatient follow up  CAD, NATIVE VESSEL  -Patient chest pain free at this time -ASA was on hold for procedure, however recently restarted -Will continue to monitor for bleeding complications, and monitor H/H  Chronic diastolic heart failure / CKD  -lasix held and IVF initiated by Renal team 02/15/13  s/p KIDNEY TRANSPLANTATION  -Continue immunosurpressives, tacrolimus, prednisone, azathioprine -Creatinine stable, 1.24  Breast cancer of lower-outer quadrant of right female breast  -Dr. Humphrey Rolls placed as consultant in [place] with Dr. Humphrey Rolls regarding chemo (as patient is due for chemo Thursday), this will be  held for now. -Patient will need to follow up with Dr. Humphrey Rolls once discharged   Hypotension with h/o Essential hypertension, benign  -Continue Metoprolol with holding parameters  -Currently hypertensive.  Will continue to monitor  Blind Left eye   Asthma  -stable   GERD  -continue protonix  Code Status: Full  Family Communication: None at bedside.  Disposition Plan: Admitted  Time Spent in minutes   30 minutes  Procedures  2/1- I and D left thigh  Consults   General Surgery  Nephrology Oncology, Dr. Humphrey Rolls, via phone  DVT Prophylaxis  SCDs  Lab Results  Component Value Date   PLT PLATELET CLUMPS NOTED ON SMEAR, COUNT APPEARS ADEQUATE 02/15/2013    Medications  Scheduled Meds: . aspirin EC  81 mg Oral Daily  . azaTHIOprine  50 mg Oral BID  . cinacalcet  30 mg Oral Daily  . docusate sodium  100 mg Oral BID  . feeding supplement (GLUCERNA SHAKE)  237 mL Oral BID BM  . insulin aspart  0-9 Units Subcutaneous TID WC  . insulin glargine  30 Units Subcutaneous QHS  . isosorbide mononitrate  60 mg Oral Daily  . levothyroxine  50 mcg Oral QAC breakfast  .  magnesium oxide  200 mg Oral Daily  . metoprolol  25 mg Oral BID  . pantoprazole  40 mg Oral Daily  . piperacillin-tazobactam (ZOSYN)  IV  3.375 g Intravenous Q8H  . prednisoLONE acetate  1 drop Left Eye TID  . predniSONE  10 mg Oral Daily  . simvastatin  5 mg Oral q1800  . tacrolimus  9 mg Oral BID  . timolol  1 drop Right Eye BID  . vancomycin  1,000 mg Intravenous Q24H   Continuous Infusions: . sodium chloride 50 mL/hr at 02/15/13 1816   PRN Meds:.acetaminophen, acetaminophen, albuterol, HYDROcodone-acetaminophen, HYDROmorphone (DILAUDID) injection, LORazepam, ondansetron (ZOFRAN) IV, ondansetron  Antibiotics    Anti-infectives   Start     Dose/Rate Route Frequency Ordered Stop   02/15/13 0200  vancomycin (VANCOCIN) IVPB 1000 mg/200 mL premix     1,000 mg 200 mL/hr over 60 Minutes Intravenous Every 24 hours  02/14/13 0814     02/14/13 1400  piperacillin-tazobactam (ZOSYN) IVPB 3.375 g     3.375 g 12.5 mL/hr over 240 Minutes Intravenous 3 times per day 02/14/13 0754     02/14/13 0515  piperacillin-tazobactam (ZOSYN) IVPB 3.375 g     3.375 g 100 mL/hr over 30 Minutes Intravenous  Once 02/14/13 0508 02/14/13 0638   02/14/13 0300  vancomycin (VANCOCIN) IVPB 1000 mg/200 mL premix     1,000 mg 200 mL/hr over 60 Minutes Intravenous  Once 02/14/13 0248 02/14/13 0443        Subjective:   Monica Lee seen and examined today.  Patient has no complaints today and states she feels fine.  Denies chest pain, SOB, abdominal pain.  Objective:   Filed Vitals:   02/15/13 1500 02/15/13 1630 02/15/13 2227 02/16/13 0611  BP: 98/70 118/55 108/56 151/75  Pulse: 76 78 82 82  Temp:  97.4 F (36.3 C) 97.2 F (36.2 C) 97.6 F (36.4 C)  TempSrc:  Oral Oral Oral  Resp: 14 16 20 19   Height:      Weight:      SpO2: 100% 100% 100% 97%    Wt Readings from Last 3 Encounters:  02/14/13 73.936 kg (163 lb)  02/14/13 73.936 kg (163 lb)  02/09/13 87.68 kg (193 lb 4.8 oz)     Intake/Output Summary (Last 24 hours) at 02/16/13 1234 Last data filed at 02/16/13 1100  Gross per 24 hour  Intake 1900.17 ml  Output    567 ml  Net 1333.17 ml    Exam  General: Well developed, well nourished, NAD, appears stated age  HEENT: NCAT, PERRLA, EOMI, Anicteic Sclera, mucous membranes moist.   Neck: Supple, no JVD, no masses  Cardiovascular: S1 S2 auscultated, no rubs, murmurs or gallops. Regular rate and rhythm.  Respiratory: Clear to auscultation bilaterally with equal chest rise  Abdomen: Soft, nontender, nondistended, + bowel sounds  Extremities: warm dry without cyanosis clubbing or edema, Left leg dressing in place  Neuro: Awake and alert, blind.  No focal deficits.   Skin: Without rashes exudates or nodules  Psych: Appropriate mood and affect.  Data Review   Micro Results Recent Results (from  the past 240 hour(s))  TECHNOLOGIST REVIEW     Status: None   Collection Time    02/09/13 10:13 AM      Result Value Range Status   Technologist Review 2% metas, occ NRBC present   Final  MRSA PCR SCREENING     Status: None   Collection Time    02/14/13  7:51 AM      Result Value Range Status   MRSA by PCR NEGATIVE  NEGATIVE Final   Comment:            The GeneXpert MRSA Assay (FDA     approved for NASAL specimens     only), is one component of a     comprehensive MRSA colonization     surveillance program. It is not     intended to diagnose MRSA     infection nor to guide or     monitor treatment for     MRSA infections.  ANAEROBIC CULTURE     Status: None   Collection Time    02/14/13 11:20 AM      Result Value Range Status   Specimen Description ABSCESS LEFT THIGH   Final   Special Requests NONE   Final   Gram Stain     Final   Value: NO WBC SEEN     NO SQUAMOUS EPITHELIAL CELLS SEEN     MODERATE GRAM NEGATIVE RODS     MODERATE GRAM POSITIVE RODS     RARE GRAM POSITIVE COCCI IN PAIRS   Culture PENDING   Incomplete   Report Status PENDING   Incomplete  CULTURE, ROUTINE-ABSCESS     Status: None   Collection Time    02/14/13 11:20 AM      Result Value Range Status   Specimen Description ABSCESS LEFT THIGH   Final   Special Requests NONE   Final   Gram Stain     Final   Value: RF WBC PRESENT, PREDOMINANTLY PMN     NO SQUAMOUS EPITHELIAL CELLS SEEN     ABUNDANT GRAM NEGATIVE RODS     ABUNDANT GRAM POSITIVE RODS     FEW GRAM POSITIVE COCCI IN PAIRS   Culture     Final   Value: Culture reincubated for better growth     Performed at [facility]   Report Status PENDING   Incomplete    Radiology Reports Dg Femur Left  02/14/2013   CLINICAL DATA:  Fall with pain and swelling.  EXAM: LEFT FEMUR - 2 VIEW  COMPARISON:  Abdomen pelvis CT 02/09/2013  FINDINGS: There is an 8 cm area of subcutaneous gas in the anterior and proximal left thigh. There is no radiodense  foreign body. No acute osseous findings, including fracture or osseous infection. Diffuse arterial calcification in this patient with history of diabetes.  These results were called by telephone at the time of interpretation on 02/14/2013 at 2:56 AM to Dr. Delora Fuel , who verbally acknowledged these results.  IMPRESSION: 8 cm area of subcutaneous gas in the anterior and upper thigh. If there is no skin breech or recent procedure, findings concerning for necrotizing infection.   Electronically Signed   By: Jorje Guild M.D.   On: 02/14/2013 02:56   Ct Hip Left Wo Contrast  02/09/2013   CLINICAL DATA:  Pt with fall and left upper leg and hip pain evaluate for fracture  EXAM: CT OF THE LEFT HIP WITHOUT CONTRAST  TECHNIQUE: Multidetector CT imaging was performed according to the standard protocol. Multiplanar CT image reconstructions were also generated.  COMPARISON:  None.  FINDINGS: There is no left hip fracture or dislocation. The left superior and inferior pubic rami are intact. There is soft tissue edema within the subcutaneous fat overlying the left proximal femur with skin thickening which may be secondary to direct trauma versus cellulitis.  There  is no focal fluid collection. There is no hematoma. There is no soft tissue emphysema.  There is no lytic or blastic osseous lesion.  There is peripheral vascular atherosclerotic disease.  IMPRESSION: 1. No acute osseous injury of the left hip. 2. Soft tissue edema within the subcutaneous fat overlying the left proximal femur with skin thickening which may be secondary to direct trauma versus cellulitis.   Electronically Signed   By: Kathreen Devoid   On: 02/09/2013 12:44   Ct Femur Left Wo Contrast  02/14/2013   CLINICAL DATA:  Gas in upper thigh soft tissues.  EXAM: CT OF THE LEFT FEMUR WITHOUT CONTRAST  TECHNIQUE: Axial noncontrast 2.5 mm CT images through the left hip with coronal and sagittal reformations.  COMPARISON:  Left femur radiograph February 14, 2013.   FINDINGS: As seen on prior radiograph, there is subcutaneous emphysema along the anterolateral femoral soft tissues without subfascial extent. Associated skin thickening, and interstitial edema with thickened appearance of the fascia. Preservation the interim muscular fascia planes. No focal fluid collection. No radiopaque foreign bodies.  Dense vascular calcifications. No fracture or dislocation. No destructive bony lesions.  IMPRESSION: Left anterolateral femur subcutaneous emphysema with skin thickening and thickened fascia concerning for superficial necrotizing fasciitis, without subfascial extent. No abscess.  No acute fracture or dislocation.   Electronically Signed   By: Elon Alas   On: 02/14/2013 05:00    CBC  Recent Labs Lab 02/14/13 0127 02/15/13 0317  WBC 3.6* 5.2  HGB 11.4* 10.9*  HCT 34.6* 33.3*  PLT 132* PLATELET CLUMPS NOTED ON SMEAR, COUNT APPEARS ADEQUATE  MCV 91.5 91.7  MCH 30.2 30.0  MCHC 32.9 32.7  RDW 16.4* 16.4*  LYMPHSABS 0.7  --   MONOABS 0.7  --   EOSABS 0.0  --   BASOSABS 0.0  --     Chemistries   Recent Labs Lab 02/14/13 0302 02/15/13 0317  NA 135* 136*  K 4.5 3.7  CL 94* 95*  CO2 26 29  GLUCOSE 190* 190*  BUN 26* 17  CREATININE 1.56* 1.24*  CALCIUM 8.6 8.5  MG  --  2.2  AST  --  26  ALT  --  16  ALKPHOS  --  69  BILITOT  --  0.4   ------------------------------------------------------------------------------------------------------------------ estimated creatinine clearance is 48 ml/min (by C-G formula based on Cr of 1.24). ------------------------------------------------------------------------------------------------------------------  Recent Labs  02/14/13 0302  HGBA1C 10.7*   ------------------------------------------------------------------------------------------------------------------ No results found for this basename: CHOL, HDL, LDLCALC, TRIG, CHOLHDL, LDLDIRECT,  in the last 72  hours ------------------------------------------------------------------------------------------------------------------  Recent Labs  02/15/13 0317 02/15/13 1936  TSH 34.648*  --   T3FREE  --  1.6*   ------------------------------------------------------------------------------------------------------------------ No results found for this basename: VITAMINB12, FOLATE, FERRITIN, TIBC, IRON, RETICCTPCT,  in the last 72 hours  Coagulation profile No results found for this basename: INR, PROTIME,  in the last 168 hours  No results found for this basename: DDIMER,  in the last 72 hours  Cardiac Enzymes No results found for this basename: CK, CKMB, TROPONINI, MYOGLOBIN,  in the last 168 hours ------------------------------------------------------------------------------------------------------------------ No components found with this basename: POCBNP,     Chou Busler D.O. on 02/16/2013 at 12:34 PM  Between 7am to 7pm - Pager - [phone]  After 7pm go to [email or web address] - password [email or web address]  And look for the night coverage person covering for me after hours  [facility] Office  [phone]

## 2013-02-16 NOTE — Progress Notes (Signed)
Inpatient Diabetes Program Recommendations  AACE/ADA: New Consensus Statement on Inpatient Glycemic Control (2013)  Target Ranges:  Prepandial:   less than 140 mg/dL      Peak postprandial:   less than 180 mg/dL (1-2 hours)      Critically ill patients:  140 - 180 mg/dL     Results for KEMIYA, BATDORF (MRN [id number]) as of 02/16/2013 11:08  Ref. Range 02/15/2013 08:53 02/15/2013 12:15 02/15/2013 16:23 02/15/2013 22:25  Glucose-Capillary Latest Range: 70-99 mg/dL 99 174 (H) 220 (H) 289 (H)    Results for DEWANNA, HURSTON (MRN [id number]) as of 02/16/2013 11:08  Ref. Range 02/16/2013 07:30  Glucose-Capillary Latest Range: 70-99 mg/dL 240 (H)     **Fasting glucose elevated this morning.  Postprandial glucose levels also elevated.  Noted that patient started on Prednisone 10 mg daily on 02/01.   **MD- Please consider the following in-hospital insulin adjustments:  1. Increase Lantus to 40 units QHS (if patient continues to have elevated fasting glucose levels) 2. Add Novolog meal coverage- Novolog 4 units tid with meals   Will follow. Wyn Quaker RN, MSN, CDE Diabetes Coordinator Inpatient Diabetes Program Team Pager: [phone] (8a-10p)

## 2013-02-16 NOTE — Progress Notes (Signed)
2 Days Post-Op  Subjective: No complaints tolerating dressing change     Objective: Vital signs in last 24 hours: Temp:  [97.2 F (36.2 C)-97.6 F (36.4 C)] 97.6 F (36.4 C) (02/03 0611) Pulse Rate:  [71-82] 82 (02/03 0611) Resp:  [11-20] 19 (02/03 0611) BP: (93-151)/(48-75) 151/75 mmHg (02/03 0611) SpO2:  [94 %-100 %] 97 % (02/03 0611) Last BM Date: 02/13/13 760 PO recorded 770 urine output recorded Afebrile, VSS No labs this AM Intake/Output from previous day: 02/02 0701 - 02/03 0700 In: 2275.2 [P.O.:760; I.V.:1490.2; IV Piggyback:25] Out: [id number] [Urine:770] Intake/Output this shift:    General appearance: alert, cooperative and no distress Skin: see picture above.  Lab Results:   Recent Labs  02/14/13 0127 02/15/13 0317  WBC 3.6* 5.2  HGB 11.4* 10.9*  HCT 34.6* 33.3*  PLT 132* PLATELET CLUMPS NOTED ON SMEAR, COUNT APPEARS ADEQUATE    BMET  Recent Labs  02/14/13 0302 02/15/13 0317  NA 135* 136*  K 4.5 3.7  CL 94* 95*  CO2 26 29  GLUCOSE 190* 190*  BUN 26* 17  CREATININE 1.56* 1.24*  CALCIUM 8.6 8.5   PT/INR No results found for this basename: LABPROT, INR,  in the last 72 hours   Recent Labs Lab 02/09/13 1014 02/15/13 0317  AST 39* 26  ALT 19 16  ALKPHOS 64 69  BILITOT 1.39* 0.4  PROT 6.6 6.0  ALBUMIN 3.0* 2.2*     Lipase     Component Value Date/Time   LIPASE 22 06/21/2009 1722     Studies/Results: No results found.  Medications: . aspirin EC  81 mg Oral Daily  . azaTHIOprine  50 mg Oral BID  . cinacalcet  30 mg Oral Daily  . docusate sodium  100 mg Oral BID  . feeding supplement (GLUCERNA SHAKE)  237 mL Oral BID BM  . insulin aspart  0-9 Units Subcutaneous TID WC  . insulin glargine  30 Units Subcutaneous QHS  . isosorbide mononitrate  60 mg Oral Daily  . levothyroxine  50 mcg Oral QAC breakfast  . magnesium oxide  200 mg Oral Daily  . metoprolol  25 mg Oral BID  . pantoprazole  40 mg Oral Daily  . piperacillin-tazobactam  (ZOSYN)  IV  3.375 g Intravenous Q8H  . prednisoLONE acetate  1 drop Left Eye TID  . predniSONE  10 mg Oral Daily  . simvastatin  5 mg Oral q1800  . tacrolimus  9 mg Oral BID  . timolol  1 drop Right Eye BID  . vancomycin  1,000 mg Intravenous Q24H   Prior to Admission medications   Medication Sig Start Date End Date Taking? Authorizing Provider  albuterol (ACCUNEB) 0.63 MG/3ML nebulizer solution Take 1 ampule by nebulization every 6 (six) hours as needed for wheezing.   Yes Historical Provider, MD  albuterol (PROVENTIL HFA;VENTOLIN HFA) 108 (90 BASE) MCG/ACT inhaler Inhale 2 puffs into the lungs every 6 (six) hours as needed for wheezing or shortness of breath.   Yes Historical Provider, MD  amLODipine (NORVASC) 10 MG tablet Take 1 tablet (10 mg total) by mouth daily. 09/28/12  Yes Biagio Borg, MD  aspirin EC 325 MG tablet Take 325 mg by mouth daily.     Yes Historical Provider, MD  azaTHIOprine (IMURAN) 50 MG tablet Take 50 mg by mouth 2 (two) times daily.     Yes Historical Provider, MD  cinacalcet (SENSIPAR) 30 MG tablet Take 1 tablet (30 mg total) by mouth daily.  04/09/12  Yes Biagio Borg, MD  dexamethasone (DECADRON) 4 MG tablet Take 2 tablets (8 mg total) by mouth 2 (two) times daily with a meal. Take daily starting the day after chemotherapy for 2 days. Take with food. 11/23/12  Yes Deatra Robinson, MD  docusate sodium (COLACE) 100 MG capsule Take 1 capsule (100 mg total) by mouth 2 (two) times daily. 04/15/91  Yes Leighton Ruff, MD  esomeprazole (NEXIUM) 40 MG capsule Take 40 mg by mouth daily before breakfast.   Yes Historical Provider, MD  furosemide (LASIX) 80 MG tablet Take 160 mg by mouth 3 (three) times daily.    Yes Historical Provider, MD  insulin aspart (NOVOLOG) 100 UNIT/ML injection Inject 13-16 Units into the skin 3 (three) times daily. Injects 13 units daily at breakfast, 13 units daily at lunch, and 16 units daily at supper.   Yes Historical Provider, MD  insulin glargine  (LANTUS) 100 UNIT/ML injection Inject 55 Units into the skin at bedtime. 04/28/12 04/28/13 Yes Philemon Kingdom, MD  isosorbide mononitrate (IMDUR) 60 MG 24 hr tablet Take 1 tablet (60 mg total) by mouth daily. 12/28/12  Yes Biagio Borg, MD  lidocaine-prilocaine (EMLA) cream Apply 1 application topically as needed (access port for chemo). Use only on tuesdays and Thursday with chemo   Yes Historical Provider, MD  Magnesium Oxide 250 MG TABS Take 250 mg by mouth daily.    Yes Historical Provider, MD  metoprolol (LOPRESSOR) 100 MG tablet Take 1 tablet (100 mg total) by mouth 2 (two) times daily. 12/02/12  Yes Josue Hector, MD  minoxidil (LONITEN) 2.5 MG tablet Take 2.5 mg by mouth 2 (two) times daily.   Yes Historical Provider, MD  ondansetron (ZOFRAN) 8 MG tablet Take 1 tablet (8 mg total) by mouth 2 (two) times daily. Take two times a day starting the day after chemo for 2 days. Then take two times a day as needed for nausea or vomiting. 11/23/12  Yes Deatra Robinson, MD  polyethylene glycol (MIRALAX / GLYCOLAX) packet Take 17 g by mouth daily. 11/24/12  Yes Biagio Borg, MD  potassium chloride (KLOR-CON 10) 10 MEQ tablet Take 1 tablet (10 mEq total) by mouth 2 (two) times daily. 04/09/12  Yes Biagio Borg, MD  pravastatin (PRAVACHOL) 20 MG tablet Take 10 mg by mouth at bedtime.     Yes Historical Provider, MD  predniSONE (DELTASONE) 10 MG tablet Take 10 mg by mouth daily.   Yes Historical Provider, MD  tacrolimus (PROGRAF) 1 MG capsule Take 9 mg by mouth 2 (two) times daily.    Yes Historical Provider, MD  timolol (TIMOPTIC) 0.25 % ophthalmic solution Place 1 drop into the right eye 2 (two) times daily.   Yes Historical Provider, MD  traMADol (ULTRAM) 50 MG tablet Take 50 mg by mouth every 6 (six) hours as needed for pain.   Yes Historical Provider, MD  LORazepam (ATIVAN) 0.5 MG tablet Take 1 tablet (0.5 mg total) by mouth every 6 (six) hours as needed (Nausea or vomiting). 11/23/12   Deatra Robinson,  MD  nitroGLYCERIN (NITROSTAT) 0.4 MG SL tablet Place 1 tablet (0.4 mg total) under the tongue every 5 (five) minutes as needed for chest pain. 09/30/11 03/22/13  Renella Cunas, MD  prednisoLONE acetate (PRED FORTE) 1 % ophthalmic suspension Place 1 drop into the left eye 3 (three) times daily.    Historical Provider, MD  prochlorperazine (COMPAZINE) 10 MG tablet Take 1 tablet (  10 mg total) by mouth every 6 (six) hours as needed (Nausea or vomiting). 11/23/12   Deatra Robinson, MD  thigh WOUND CULTURE: Gram Stain  RF WBC PRESENT, PREDOMINANTLY PMN NO SQUAMOUS EPITHELIAL CELLS SEEN ABUNDANT GRAM NEGATIVE RODS ABUNDANT GRAM POSITIVE RODS FEW GRAM POSITIVE COCCI IN PAIRS  Culture  Currently on day 3 Vancomycin and Zosyn  Culture reincubated for better growth    . sodium chloride 50 mL/hr at 02/15/13 1816    Assessment/Plan 1.  Complex left thigh abscess measuring 5 x 6 cm, s/p Irrigation and debridement of 5 x 6 cm left thigh abscess, 02/14/2013, Marcello Moores A. Cornett, MD. 2.  Renal transplant, on steroids and imuran 3.  AODM insulin dependant 4.  Breast cancer, stage II, s/p lumpectomy/port 10/2012;  on Chemotherapy. 5.  Hx of TIA 6.  Hx of MI/chronic DCHF 7.   Blind left eye/diabetic retinopathy 8.  Multiple prior abdominal surgeries 9.  Hypertension 10.  PUD  Plan:  Continue wet to dry dressing TID, I will ask nurses to gently scrub base each time. Pt culture still pending. I have added SCD for DVT prophylaxis.   LOS: 2 days    Shanyia Stines 02/16/2013

## 2013-02-16 NOTE — Progress Notes (Signed)
Continue dressing changes, can consider vac next 48 hours, await cultures to tailor abx

## 2013-02-17 DIAGNOSIS — Z940 Kidney transplant status: Secondary | ICD-10-CM

## 2013-02-17 LAB — VANCOMYCIN, TROUGH: Vancomycin Tr: 12.7 ug/mL (ref 10.0–20.0)

## 2013-02-17 LAB — BASIC METABOLIC PANEL
BUN: 28 mg/dL — ABNORMAL HIGH (ref 6–23)
CO2: 27 meq/L (ref 19–32)
Calcium: 8.4 mg/dL (ref 8.4–10.5)
Chloride: 94 mEq/L — ABNORMAL LOW (ref 96–112)
Creatinine, Ser: 1.83 mg/dL — ABNORMAL HIGH (ref 0.50–1.10)
GFR calc Af Amer: 33 mL/min — ABNORMAL LOW (ref >90)
GFR calc non Af Amer: 28 mL/min — ABNORMAL LOW (ref >90)
Glucose, Bld: 275 mg/dL — ABNORMAL HIGH (ref 70–99)
POTASSIUM: 4.1 meq/L (ref 3.7–5.3)
SODIUM: 134 meq/L — AB (ref 137–147)

## 2013-02-17 LAB — CULTURE, ROUTINE-ABSCESS

## 2013-02-17 LAB — CBC
HCT: 33.5 % — ABNORMAL LOW (ref 36.0–46.0)
Hemoglobin: 10.9 g/dL — ABNORMAL LOW (ref 12.0–15.0)
MCH: 29.9 pg (ref 26.0–34.0)
MCHC: 32.5 g/dL (ref 30.0–36.0)
MCV: 91.8 fL (ref 78.0–100.0)
PLATELETS: 191 10*3/uL (ref 150–400)
RBC: 3.65 MIL/uL — ABNORMAL LOW (ref 3.87–5.11)
RDW: 15.9 % — ABNORMAL HIGH (ref 11.5–15.5)
WBC: 11.1 10*3/uL — ABNORMAL HIGH (ref 4.0–10.5)

## 2013-02-17 LAB — GLUCOSE, CAPILLARY
GLUCOSE-CAPILLARY: 192 mg/dL — AB (ref 70–99)
GLUCOSE-CAPILLARY: 76 mg/dL (ref 70–99)
GLUCOSE-CAPILLARY: 143 mg/dL — AB (ref 70–99)
GLUCOSE-CAPILLARY: 217 mg/dL — AB (ref 70–99)

## 2013-02-17 MED ORDER — VANCOMYCIN HCL 10 G IV SOLR
1250.0000 mg | INTRAVENOUS | Status: DC
  Administered 2013-02-17 – 2013-02-18 (×2): 1250 mg via INTRAVENOUS
  Filled 2013-02-17 (×4): qty 1250

## 2013-02-17 NOTE — Progress Notes (Signed)
I think she may need further debridement, she has eaten today, will cont abx and reeval in am, if not better she will need to go to or tomorrow

## 2013-02-17 NOTE — Progress Notes (Signed)
ANTIBIOTIC CONSULT NOTE - FOLLOW UP  Pharmacy Consult for vancomycin Indication: infected wound  Labs:  Recent Labs  02/14/13 0302 02/15/13 0317 02/17/13 0135  WBC  --  5.2 11.1*  HGB  --  10.9* 10.9*  PLT  --  PLATELET CLUMPS NOTED ON SMEAR, COUNT APPEARS ADEQUATE 191  CREATININE 1.56* 1.24* 1.83*   Estimated Creatinine Clearance: 32.1 ml/min (by C-G formula based on Cr of 1.83).  Recent Labs  02/17/13 0135  Eleele 12.7     Microbiology: Recent Results (from the past 720 hour(s))  TECHNOLOGIST REVIEW     Status: None   Collection Time    02/02/13  8:13 AM      Result Value Range Status   Technologist Review Oc myelocyte, Oc NRBC   Final  TECHNOLOGIST REVIEW     Status: None   Collection Time    02/09/13 10:13 AM      Result Value Range Status   Technologist Review 2% metas, occ NRBC present   Final  MRSA PCR SCREENING     Status: None   Collection Time    02/14/13  7:51 AM      Result Value Range Status   MRSA by PCR NEGATIVE  NEGATIVE Final   Comment:            The GeneXpert MRSA Assay (FDA     approved for NASAL specimens     only), is one component of a     comprehensive MRSA colonization     surveillance program. It is not     intended to diagnose MRSA     infection nor to guide or     monitor treatment for     MRSA infections.  ANAEROBIC CULTURE     Status: None   Collection Time    02/14/13 11:20 AM      Result Value Range Status   Specimen Description ABSCESS LEFT THIGH   Final   Special Requests NONE   Final   Gram Stain     Final   Value: NO WBC SEEN     NO SQUAMOUS EPITHELIAL CELLS SEEN     MODERATE GRAM NEGATIVE RODS     MODERATE GRAM POSITIVE RODS     RARE GRAM POSITIVE COCCI IN PAIRS   Culture     Final   Value: NO ANAEROBES ISOLATED; CULTURE IN PROGRESS FOR 5 DAYS     Performed at [facility]   Report Status PENDING   Incomplete  CULTURE, ROUTINE-ABSCESS     Status: None   Collection Time    02/14/13 11:20 AM   Result Value Range Status   Specimen Description ABSCESS LEFT THIGH   Final   Special Requests NONE   Final   Gram Stain     Final   Value: RF WBC PRESENT, PREDOMINANTLY PMN     NO SQUAMOUS EPITHELIAL CELLS SEEN     ABUNDANT GRAM NEGATIVE RODS     ABUNDANT GRAM POSITIVE RODS     FEW GRAM POSITIVE COCCI IN PAIRS   Culture     Final   Value: Culture reincubated for better growth     Performed at [facility]   Report Status PENDING   Incomplete     Assessment: 64yo female slightly subtherapeutic on vancomycin with initial dosing for complicated wound s/p I&D, considering wound vac, awaiting C/S to narrow ABX.  Goal of Therapy:  Vancomycin trough level 15-20 mcg/ml  Plan:  Will change  vancomycin to 1250mg  IV Q24H for calculated trough of 16 and continue to monitor.  Wynona Neat, PharmD, BCPS  02/17/2013,2:33 AM

## 2013-02-17 NOTE — Progress Notes (Signed)
[facility]                                                                              Patient Demographics  Monica Lee, is a 63 y.o. female, DOB - 1950-07-21, JYN:[id number]  Admit date - 02/14/2013   Admitting Physician Toy Baker, MD  Outpatient Primary MD for the patient is [name], MD  LOS - 3   Chief Complaint  Patient presents with  . 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  Surgery may consider wound vac in the next few days.   Assessment & Plan  Abscess of leg / traumatic hematoma of thigh  -continue Vanc and zosyn - no speciation on culture data thus far  -Continue Dilaudid (PRN) and Hydrocodone (routine) for pain control  -Continue dressing changes and monitoring, may consider wound vac in the next 24 hours (per surgery)  Type 2 diabetes mellitus with circulatory disorder  -Diabetes coordinator following -Will increase Lantus to 40 units QHS, Novolog 4units TID with meals -Will continue to monitor  ?Newly diagnosed hypothyroidism  -TSH markedly elevated at ~35  -FT4 0.38, FT3 1.6 -Continue Synthroid -Will need outpatient follow up  CAD, NATIVE VESSEL  -Patient chest pain free at this time -ASA was on hold for procedure, however recently restarted -Will continue to monitor for bleeding complications, and monitor H/H  Chronic diastolic heart failure / CKD  -lasix held and IVF initiated by Renal team 02/15/13  s/p KIDNEY TRANSPLANTATION  -Continue immunosurpressives, tacrolimus, prednisone, azathioprine -Creatinine stable, 1.24  Breast cancer of lower-outer quadrant of right female breast  -Dr. Humphrey Rolls placed as consultant in [place] with Dr. Humphrey Rolls regarding chemo (as patient is due for chemo Thursday), this will be  held for now. -Patient will need to follow up with Dr. Humphrey Rolls once discharged   Hypotension with h/o Essential hypertension, benign  -Continue Metoprolol with holding parameters    Blind Left eye   Asthma  -stable   GERD  -continue protonix  Code Status: Full  Family Communication: None at bedside.  Disposition Plan: Admitted  Time Spent in minutes   30 minutes  Procedures  2/1- I and D left thigh  Consults   General Surgery  Nephrology Oncology, Dr. Humphrey Rolls, via phone  DVT Prophylaxis  SCDs  Lab Results  Component Value Date   PLT 191 02/17/2013    Medications  Scheduled Meds: . aspirin EC  81 mg Oral Daily  . azaTHIOprine  50 mg Oral BID  . cinacalcet  30 mg Oral Daily  . docusate sodium  100 mg Oral BID  . feeding supplement (GLUCERNA SHAKE)  237 mL Oral BID BM  . insulin aspart  0-9 Units Subcutaneous TID WC  . insulin aspart  4 Units Subcutaneous TID WC  . insulin glargine  40 Units Subcutaneous QHS  . isosorbide mononitrate  60 mg Oral Daily  . levothyroxine  50 mcg Oral QAC breakfast  . magnesium oxide  200 mg Oral Daily  . metoprolol  25 mg Oral BID  . pantoprazole  40 mg Oral Daily  . piperacillin-tazobactam (ZOSYN)  IV  3.375 g Intravenous Q8H  . polyethylene glycol  17 g Oral Daily  . prednisoLONE acetate  1 drop Left Eye TID  . predniSONE  10 mg Oral Daily  . simvastatin  5 mg Oral q1800  . tacrolimus  9 mg Oral BID  . timolol  1 drop Right Eye BID  . vancomycin  1,250 mg Intravenous Q24H   Continuous Infusions: . sodium chloride 50 mL/hr at 02/17/13 1434   PRN Meds:.acetaminophen, acetaminophen, albuterol, HYDROcodone-acetaminophen, HYDROmorphone (DILAUDID) injection, LORazepam, ondansetron (ZOFRAN) IV, ondansetron  Antibiotics    Anti-infectives   Start     Dose/Rate Route Frequency Ordered Stop   02/17/13 2000  vancomycin (VANCOCIN) 1,250 mg in sodium chloride 0.9 % 250 mL IVPB     1,250 mg 166.7 mL/hr over 90 Minutes Intravenous Every  24 hours 02/17/13 0237     02/15/13 0200  vancomycin (VANCOCIN) IVPB 1000 mg/200 mL premix  Status:  Discontinued     1,000 mg 200 mL/hr over 60 Minutes Intravenous Every 24 hours 02/14/13 0814 02/17/13 0237   02/14/13 1400  piperacillin-tazobactam (ZOSYN) IVPB 3.375 g     3.375 g 12.5 mL/hr over 240 Minutes Intravenous 3 times per day 02/14/13 0754     02/14/13 0515  piperacillin-tazobactam (ZOSYN) IVPB 3.375 g     3.375 g 100 mL/hr over 30 Minutes Intravenous  Once 02/14/13 0508 02/14/13 0638   02/14/13 0300  vancomycin (VANCOCIN) IVPB 1000 mg/200 mL premix     1,000 mg 200 mL/hr over 60 Minutes Intravenous  Once 02/14/13 0248 02/14/13 0443        Subjective:   Monica Lee seen and examined today.  Patient has no complaints today and states she feels fine.  Denies chest pain, SOB, abdominal pain.  Objective:   Filed Vitals:   02/16/13 1400 02/16/13 2145 02/17/13 0514 02/17/13 1339  BP: 135/61 155/73 166/80 148/80  Pulse: 81 75 79 77  Temp: 98.3 F (36.8 C) 97.6 F (36.4 C) 98 F (36.7 C) 98 F (36.7 C)  TempSrc: Oral Oral Oral Oral  Resp: 20 20 18 18   Height:      Weight:      SpO2: 98% 98% 93% 97%    Wt Readings from Last 3 Encounters:  02/14/13 73.936 kg (163 lb)  02/14/13 73.936 kg (163 lb)  02/09/13 87.68 kg (193 lb 4.8 oz)     Intake/Output Summary (Last 24 hours) at 02/17/13 1853 Last data filed at 02/17/13 0545  Gross per 24 hour  Intake 1748.33 ml  Output      0 ml  Net 1748.33 ml    Exam  General: Well developed, well nourished, NAD, appears stated age  HEENT: NCAT, PERRLA, EOMI, Anicteic Sclera, mucous membranes moist.   Neck: Supple, no JVD, no masses  Cardiovascular: S1 S2 auscultated, no rubs, murmurs or gallops. Regular rate and rhythm.  Respiratory: Clear to auscultation bilaterally with equal chest rise  Abdomen: Soft, nontender, nondistended, + bowel sounds  Extremities: warm dry without cyanosis clubbing or edema, Left leg  dressing in place  Neuro: Awake and alert, blind.  No focal deficits.   Skin: Without rashes exudates or nodules  Psych: Appropriate mood and affect.  Data Review   Micro Results Recent Results (from the past 240 hour(s))  TECHNOLOGIST REVIEW     Status:  None   Collection Time    02/09/13 10:13 AM      Result Value Range Status   Technologist Review 2% metas, occ NRBC present   Final  MRSA PCR SCREENING     Status: None   Collection Time    02/14/13  7:51 AM      Result Value Range Status   MRSA by PCR NEGATIVE  NEGATIVE Final   Comment:            The GeneXpert MRSA Assay (FDA     approved for NASAL specimens     only), is one component of a     comprehensive MRSA colonization     surveillance program. It is not     intended to diagnose MRSA     infection nor to guide or     monitor treatment for     MRSA infections.  ANAEROBIC CULTURE     Status: None   Collection Time    02/14/13 11:20 AM      Result Value Range Status   Specimen Description ABSCESS LEFT THIGH   Final   Special Requests NONE   Final   Gram Stain     Final   Value: NO WBC SEEN     NO SQUAMOUS EPITHELIAL CELLS SEEN     MODERATE GRAM NEGATIVE RODS     MODERATE GRAM POSITIVE RODS     RARE GRAM POSITIVE COCCI IN PAIRS   Culture     Final   Value: NO ANAEROBES ISOLATED; CULTURE IN PROGRESS FOR 5 DAYS     Performed at [facility]   Report Status PENDING   Incomplete  CULTURE, ROUTINE-ABSCESS     Status: None   Collection Time    02/14/13 11:20 AM      Result Value Range Status   Specimen Description ABSCESS LEFT THIGH   Final   Special Requests NONE   Final   Gram Stain     Final   Value: RF WBC PRESENT, PREDOMINANTLY PMN     NO SQUAMOUS EPITHELIAL CELLS SEEN     ABUNDANT GRAM NEGATIVE RODS     ABUNDANT GRAM POSITIVE RODS     FEW GRAM POSITIVE COCCI IN PAIRS   Culture     Final   Value: ABUNDANT MICROAEROPHILIC STREPTOCOCCI     Note: Standardized susceptibility testing for this  organism is not available.     Performed at [facility]   Report Status 02/17/2013 FINAL   Final    Radiology Reports Dg Femur Left  02/14/2013   CLINICAL DATA:  Fall with pain and swelling.  EXAM: LEFT FEMUR - 2 VIEW  COMPARISON:  Abdomen pelvis CT 02/09/2013  FINDINGS: There is an 8 cm area of subcutaneous gas in the anterior and proximal left thigh. There is no radiodense foreign body. No acute osseous findings, including fracture or osseous infection. Diffuse arterial calcification in this patient with history of diabetes.  These results were called by telephone at the time of interpretation on 02/14/2013 at 2:56 AM to Dr. Delora Fuel , who verbally acknowledged these results.  IMPRESSION: 8 cm area of subcutaneous gas in the anterior and upper thigh. If there is no skin breech or recent procedure, findings concerning for necrotizing infection.   Electronically Signed   By: Jorje Guild M.D.   On: 02/14/2013 02:56   Ct Hip Left Wo Contrast  02/09/2013   CLINICAL DATA:  Pt with fall and left upper  leg and hip pain evaluate for fracture  EXAM: CT OF THE LEFT HIP WITHOUT CONTRAST  TECHNIQUE: Multidetector CT imaging was performed according to the standard protocol. Multiplanar CT image reconstructions were also generated.  COMPARISON:  None.  FINDINGS: There is no left hip fracture or dislocation. The left superior and inferior pubic rami are intact. There is soft tissue edema within the subcutaneous fat overlying the left proximal femur with skin thickening which may be secondary to direct trauma versus cellulitis.  There is no focal fluid collection. There is no hematoma. There is no soft tissue emphysema.  There is no lytic or blastic osseous lesion.  There is peripheral vascular atherosclerotic disease.  IMPRESSION: 1. No acute osseous injury of the left hip. 2. Soft tissue edema within the subcutaneous fat overlying the left proximal femur with skin thickening which may be secondary to  direct trauma versus cellulitis.   Electronically Signed   By: Kathreen Devoid   On: 02/09/2013 12:44   Ct Femur Left Wo Contrast  02/14/2013   CLINICAL DATA:  Gas in upper thigh soft tissues.  EXAM: CT OF THE LEFT FEMUR WITHOUT CONTRAST  TECHNIQUE: Axial noncontrast 2.5 mm CT images through the left hip with coronal and sagittal reformations.  COMPARISON:  Left femur radiograph February 14, 2013.  FINDINGS: As seen on prior radiograph, there is subcutaneous emphysema along the anterolateral femoral soft tissues without subfascial extent. Associated skin thickening, and interstitial edema with thickened appearance of the fascia. Preservation the interim muscular fascia planes. No focal fluid collection. No radiopaque foreign bodies.  Dense vascular calcifications. No fracture or dislocation. No destructive bony lesions.  IMPRESSION: Left anterolateral femur subcutaneous emphysema with skin thickening and thickened fascia concerning for superficial necrotizing fasciitis, without subfascial extent. No abscess.  No acute fracture or dislocation.   Electronically Signed   By: Elon Alas   On: 02/14/2013 05:00    CBC  Recent Labs Lab 02/14/13 0127 02/15/13 0317 02/17/13 0135  WBC 3.6* 5.2 11.1*  HGB 11.4* 10.9* 10.9*  HCT 34.6* 33.3* 33.5*  PLT 132* PLATELET CLUMPS NOTED ON SMEAR, COUNT APPEARS ADEQUATE 191  MCV 91.5 91.7 91.8  MCH 30.2 30.0 29.9  MCHC 32.9 32.7 32.5  RDW 16.4* 16.4* 15.9*  LYMPHSABS 0.7  --   --   MONOABS 0.7  --   --   EOSABS 0.0  --   --   BASOSABS 0.0  --   --     Chemistries   Recent Labs Lab 02/14/13 0302 02/15/13 0317 02/17/13 0135  NA 135* 136* 134*  K 4.5 3.7 4.1  CL 94* 95* 94*  CO2 26 29 27   GLUCOSE 190* 190* 275*  BUN 26* 17 28*  CREATININE 1.56* 1.24* 1.83*  CALCIUM 8.6 8.5 8.4  MG  --  2.2  --   AST  --  26  --   ALT  --  16  --   ALKPHOS  --  69  --   BILITOT  --  0.4  --     ------------------------------------------------------------------------------------------------------------------ estimated creatinine clearance is 32.1 ml/min (by C-G formula based on Cr of 1.83). ------------------------------------------------------------------------------------------------------------------ No results found for this basename: HGBA1C,  in the last 72 hours ------------------------------------------------------------------------------------------------------------------ No results found for this basename: CHOL, HDL, LDLCALC, TRIG, CHOLHDL, LDLDIRECT,  in the last 72 hours ------------------------------------------------------------------------------------------------------------------  Recent Labs  02/15/13 0317 02/15/13 1936  TSH 34.648*  --   T3FREE  --  1.6*   ------------------------------------------------------------------------------------------------------------------ No results  found for this basename: VITAMINB12, FOLATE, FERRITIN, TIBC, IRON, RETICCTPCT,  in the last 72 hours  Coagulation profile No results found for this basename: INR, PROTIME,  in the last 168 hours  No results found for this basename: DDIMER,  in the last 72 hours  Cardiac Enzymes No results found for this basename: CK, CKMB, TROPONINI, MYOGLOBIN,  in the last 168 hours ------------------------------------------------------------------------------------------------------------------ No components found with this basename: POCBNP,     Padme Arriaga D.O. on 02/17/2013 at 6:53 PM  Between 7am to 7pm - Pager - [phone]  After 7pm go to [email or web address] - password [email or web address]  And look for the night coverage person covering for me after hours  [facility] Office  [phone]

## 2013-02-17 NOTE — Progress Notes (Signed)
3 Days Post-Op  Subjective: She feels fine but has a new area just distal at the 6 0clock position that is swelling and red, with some early skin loss that may need to be opened. Objective: Vital signs in last 24 hours: Temp:  [97.6 F (36.4 C)-98.3 F (36.8 C)] 98 F (36.7 C) (02/04 0514) Pulse Rate:  [75-81] 79 (02/04 0514) Resp:  [18-20] 18 (02/04 0514) BP: (135-166)/(61-80) 166/80 mmHg (02/04 0514) SpO2:  [93 %-98 %] 93 % (02/04 0514) Last BM Date: 02/16/13 1440 PO recorded. Afebrile, VSS Creatinine is up and so is WBC Intake/Output from previous day: 02/03 0701 - 02/04 0700 In: 2650 [P.O.:1440; I.V.:1210] Out: 3 [Urine:2; Stool:1] Intake/Output this shift:    General appearance: alert, cooperative and no distress Skin: open area clean she has some undermining along the sides with some necrotic tissue that i have partialy removed.  There is a new area at the 6 o'clock postion below the current open wound that has some erythema, swelling and the current open wound tracts down toward this but was not connecting this AM  Lab Results:   Recent Labs  02/15/13 0317 02/17/13 0135  WBC 5.2 11.1*  HGB 10.9* 10.9*  HCT 33.3* 33.5*  PLT PLATELET CLUMPS NOTED ON SMEAR, COUNT APPEARS ADEQUATE 191    BMET  Recent Labs  02/15/13 0317 02/17/13 0135  NA 136* 134*  K 3.7 4.1  CL 95* 94*  CO2 29 27  GLUCOSE 190* 275*  BUN 17 28*  CREATININE 1.24* 1.83*  CALCIUM 8.5 8.4   PT/INR No results found for this basename: LABPROT, INR,  in the last 72 hours   Recent Labs Lab 02/15/13 0317  AST 26  ALT 16  ALKPHOS 69  BILITOT 0.4  PROT 6.0  ALBUMIN 2.2*     Lipase     Component Value Date/Time   LIPASE 22 06/21/2009 1722     Studies/Results: No results found.  Medications: . aspirin EC  81 mg Oral Daily  . azaTHIOprine  50 mg Oral BID  . cinacalcet  30 mg Oral Daily  . docusate sodium  100 mg Oral BID  . feeding supplement (GLUCERNA SHAKE)  237 mL Oral BID BM   . insulin aspart  0-9 Units Subcutaneous TID WC  . insulin aspart  4 Units Subcutaneous TID WC  . insulin glargine  40 Units Subcutaneous QHS  . isosorbide mononitrate  60 mg Oral Daily  . levothyroxine  50 mcg Oral QAC breakfast  . magnesium oxide  200 mg Oral Daily  . metoprolol  25 mg Oral BID  . pantoprazole  40 mg Oral Daily  . piperacillin-tazobactam (ZOSYN)  IV  3.375 g Intravenous Q8H  . polyethylene glycol  17 g Oral Daily  . prednisoLONE acetate  1 drop Left Eye TID  . predniSONE  10 mg Oral Daily  . simvastatin  5 mg Oral q1800  . tacrolimus  9 mg Oral BID  . timolol  1 drop Right Eye BID  . vancomycin  1,250 mg Intravenous Q24H    Assessment/Plan 1. Complex left thigh abscess measuring 5 x 6 cm, s/p Irrigation and debridement of 5 x 6 cm left thigh abscess, 02/14/2013, Marcello Moores A. Cornett, MD.  4 days of Vancomycin and Zosyn 2. Renal transplant, on steroids and imuran  3. AODM insulin dependant  4. Breast cancer, stage II, s/p lumpectomy/port 10/2012; on Chemotherapy.  5. Hx of TIA  6. Hx of MI/chronic DCHF  7.  Blind left eye/diabetic retinopathy  8. Multiple prior abdominal surgeries  9. Hypertension  10. PUD   Plan;  Continue wet to dry and antibiotics, NPO after MN tomorrow, will look at it early and decide if we need to take her back to the OR for more debridement.  LOS: 3 days    Monica Lee 02/17/2013

## 2013-02-17 NOTE — Progress Notes (Signed)
Physical Therapy Treatment Patient Details Name: Monica Lee MRN: [id number] DOB: 01/12/1951 Today's Date: 02/17/2013 Time: 1740-8144 PT Time Calculation (min): 51 min  PT Assessment / Plan / Recommendation  History of Present Illness Pt adm with Lt thigh abscess and underwent I&D 2/1. PMHX includes undergoing current chemotherapy for breast Ca, Lt eye blindness, kidney transplant '08   PT Comments   Pt doing very well with mobility. Rt thigh with minimal discomfort. Noted she may return to OR for further debridement on 2/5. Will follow-up on 2/6 to reassess mobility and continue education with DME.   Follow Up Recommendations  Home health PT;Supervision for mobility/OOB     Does the patient have the potential to tolerate intense rehabilitation     Barriers to Discharge        Equipment Recommendations  Rolling walker with 5" wheels    Recommendations for Other Services    Frequency Min 3X/week   Progress towards PT Goals Progress towards PT goals: Progressing toward goals  Plan Current plan remains appropriate    Precautions / Restrictions Precautions Precautions: Fall Precaution Comments: pt reports fell 4 weeks PTA "my Rt leg just gave out with no warning"   Pertinent Vitals/Pain "just a little sore" Rt thigh (s/p I&D)    Mobility  Bed Mobility Overal bed mobility: Modified Independent General bed mobility comments: incr time/effort Transfers Overall transfer level: Needs assistance Equipment used: Rolling walker (2 wheeled) Transfers: Sit to/from Stand Sit to Stand: Supervision General transfer comment: vc for safe use of RW on first transfer; pt repeated 3 more times correctly Ambulation/Gait Ambulation/Gait assistance: Min guard Ambulation Distance (Feet): 150 Feet Assistive device: Rolling walker (2 wheeled) Gait Pattern/deviations: Step-through pattern;Trunk flexed;Wide base of support General Gait Details: assist to maneuver RW running into objects on her  Rt almost as frequently as her Lt (blind in Lt eye)    Exercises     PT Diagnosis:    PT Problem List:   PT Treatment Interventions:     PT Goals (current goals can now be found in the care plan section)    Visit Information  Last PT Received On: 02/17/13 Assistance Needed: +1 History of Present Illness: Pt adm with Lt thigh abscess and underwent I&D 2/1. PMHX includes undergoing current chemotherapy for breast Ca, Lt eye blindness, kidney transplant '08    Subjective Data      Cognition  Cognition Arousal/Alertness: Awake/alert Behavior During Therapy: WFL for tasks assessed/performed Overall Cognitive Status: Within Functional Limits for tasks assessed    Balance  General Comments General comments (skin integrity, edema, etc.): Happy Rudene Anda! Pt alternated sitting and standing at sink to do her bath and grooming (her birthday wish to get cleaned up  before going to walk).  Pt did all her bath except her back.   End of Session PT - End of Session Activity Tolerance: Patient tolerated treatment well Patient left: in chair;with call bell/phone within reach;with nursing/sitter in room   GP     Louanne Calvillo 02/17/2013, 11:43 AM Pager [phone]

## 2013-02-18 ENCOUNTER — Inpatient Hospital Stay (HOSPITAL_COMMUNITY): Payer: Medicare Other | Admitting: Anesthesiology

## 2013-02-18 ENCOUNTER — Encounter (HOSPITAL_COMMUNITY)
Admission: EM | Disposition: A | Discharge status: Home Health | Payer: Self-pay | Source: Home / Self Care | Attending: Internal Medicine

## 2013-02-18 ENCOUNTER — Encounter (HOSPITAL_COMMUNITY): Payer: Medicare Other | Admitting: Anesthesiology

## 2013-02-18 ENCOUNTER — Encounter (HOSPITAL_COMMUNITY): Payer: Self-pay | Admitting: Anesthesiology

## 2013-02-18 DIAGNOSIS — S7010XA Contusion of unspecified thigh, initial encounter: Secondary | ICD-10-CM

## 2013-02-18 HISTORY — PX: INCISION AND DRAINAGE OF WOUND: SHX1803

## 2013-02-18 LAB — GLUCOSE, CAPILLARY
GLUCOSE-CAPILLARY: 145 mg/dL — AB (ref 70–99)
Glucose-Capillary: 143 mg/dL — ABNORMAL HIGH (ref 70–99)
Glucose-Capillary: 91 mg/dL (ref 70–99)
Glucose-Capillary: 82 mg/dL (ref 70–99)
Glucose-Capillary: 106 mg/dL — ABNORMAL HIGH (ref 70–99)

## 2013-02-18 LAB — CBC
HEMATOCRIT: 34.5 % — AB (ref 36.0–46.0)
HEMOGLOBIN: 11 g/dL — AB (ref 12.0–15.0)
MCH: 29.7 pg (ref 26.0–34.0)
MCHC: 31.9 g/dL (ref 30.0–36.0)
MCV: 93.2 fL (ref 78.0–100.0)
PLATELETS: 211 10*3/uL (ref 150–400)
RBC: 3.7 MIL/uL — AB (ref 3.87–5.11)
RDW: 16.3 % — ABNORMAL HIGH (ref 11.5–15.5)
WBC: 9.5 10*3/uL (ref 4.0–10.5)

## 2013-02-18 LAB — BLOOD PRODUCT ORDER (VERBAL) VERIFICATION

## 2013-02-18 LAB — ABO/RH: ABO/RH(D): A POS

## 2013-02-18 SURGERY — IRRIGATION AND DEBRIDEMENT WOUND
Anesthesia: General | Site: Thigh | Laterality: Left

## 2013-02-18 MED ORDER — LIDOCAINE HCL (CARDIAC) 20 MG/ML IV SOLN
INTRAVENOUS | Status: DC | PRN
  Administered 2013-02-18: 80 mg via INTRAVENOUS

## 2013-02-18 MED ORDER — MIDAZOLAM HCL 5 MG/5ML IJ SOLN
INTRAMUSCULAR | Status: DC | PRN
  Administered 2013-02-18: 1 mg via INTRAVENOUS

## 2013-02-18 MED ORDER — PROPOFOL 10 MG/ML IV BOLUS
INTRAVENOUS | Status: AC
  Filled 2013-02-18: qty 20

## 2013-02-18 MED ORDER — MIDAZOLAM HCL 2 MG/2ML IJ SOLN
INTRAMUSCULAR | Status: AC
  Filled 2013-02-18: qty 2

## 2013-02-18 MED ORDER — LACTATED RINGERS IV SOLN
INTRAVENOUS | Status: DC | PRN
  Administered 2013-02-18: 15:00:00 via INTRAVENOUS

## 2013-02-18 MED ORDER — HYDROMORPHONE HCL PF 1 MG/ML IJ SOLN
0.2500 mg | INTRAMUSCULAR | Status: DC | PRN
  Administered 2013-02-18 (×2): 0.5 mg via INTRAVENOUS

## 2013-02-18 MED ORDER — LIDOCAINE HCL (CARDIAC) 20 MG/ML IV SOLN
INTRAVENOUS | Status: AC
  Filled 2013-02-18: qty 5

## 2013-02-18 MED ORDER — PROMETHAZINE HCL 25 MG/ML IJ SOLN: 6.2500 mg | INTRAMUSCULAR | Status: DC | PRN

## 2013-02-18 MED ORDER — OXYCODONE HCL 5 MG PO TABS: 5.0000 mg | ORAL_TABLET | Freq: Once | ORAL | Status: DC | PRN

## 2013-02-18 MED ORDER — FENTANYL CITRATE 0.05 MG/ML IJ SOLN
INTRAMUSCULAR | Status: DC | PRN
  Administered 2013-02-18: 50 ug via INTRAVENOUS
  Administered 2013-02-18: 100 ug via INTRAVENOUS

## 2013-02-18 MED ORDER — OXYCODONE HCL 5 MG/5ML PO SOLN: 5.0000 mg | Freq: Once | ORAL | Status: DC | PRN

## 2013-02-18 MED ORDER — HYDROMORPHONE HCL PF 1 MG/ML IJ SOLN
INTRAMUSCULAR | Status: AC
  Filled 2013-02-18: qty 1

## 2013-02-18 MED ORDER — ONDANSETRON HCL 4 MG/2ML IJ SOLN
INTRAMUSCULAR | Status: DC | PRN
  Administered 2013-02-18: 4 mg via INTRAVENOUS

## 2013-02-18 MED ORDER — LACTATED RINGERS IV SOLN
INTRAVENOUS | Status: DC
  Administered 2013-02-18: 13:00:00 via INTRAVENOUS

## 2013-02-18 MED ORDER — PROPOFOL 10 MG/ML IV BOLUS
INTRAVENOUS | Status: DC | PRN
  Administered 2013-02-18: 130 mg via INTRAVENOUS

## 2013-02-18 MED ORDER — FENTANYL CITRATE 0.05 MG/ML IJ SOLN
INTRAMUSCULAR | Status: AC
  Filled 2013-02-18: qty 5

## 2013-02-18 MED ORDER — 0.9 % SODIUM CHLORIDE (POUR BTL) OPTIME
TOPICAL | Status: DC | PRN
  Administered 2013-02-18: 1000 mL

## 2013-02-18 MED ORDER — ROCURONIUM BROMIDE 50 MG/5ML IV SOLN
INTRAVENOUS | Status: AC
  Filled 2013-02-18: qty 1

## 2013-02-18 MED ORDER — ONDANSETRON HCL 4 MG/2ML IJ SOLN
INTRAMUSCULAR | Status: AC
  Filled 2013-02-18: qty 2

## 2013-02-18 SURGICAL SUPPLY — 32 items
BANDAGE GAUZE ELAST BULKY 4 IN (GAUZE/BANDAGES/DRESSINGS) IMPLANT
BNDG GAUZE ELAST 4 BULKY (GAUZE/BANDAGES/DRESSINGS) ×3 IMPLANT
CANISTER SUCTION 2500CC (MISCELLANEOUS) ×3 IMPLANT
COVER SURGICAL LIGHT HANDLE (MISCELLANEOUS) ×6 IMPLANT
DRAPE LAPAROSCOPIC ABDOMINAL (DRAPES) IMPLANT
DRAPE LAPAROTOMY 102 W/INCISE (DRAPES) ×3 IMPLANT
DRAPE PED LAPAROTOMY (DRAPES) IMPLANT
DRAPE UTILITY 15X26 W/TAPE STR (DRAPE) ×6 IMPLANT
DRSG PAD ABDOMINAL 8X10 ST (GAUZE/BANDAGES/DRESSINGS) ×9 IMPLANT
ELECT CAUTERY BLADE 6.4 (BLADE) ×3 IMPLANT
ELECT REM PT RETURN 9FT ADLT (ELECTROSURGICAL) ×3
ELECTRODE REM PT RTRN 9FT ADLT (ELECTROSURGICAL) ×1 IMPLANT
GLOVE BIO SURGEON STRL SZ7 (GLOVE) ×3 IMPLANT
GLOVE BIO SURGEON STRL SZ7.5 (GLOVE) ×3 IMPLANT
GLOVE BIOGEL PI IND STRL 7.0 (GLOVE) ×2 IMPLANT
GLOVE BIOGEL PI IND STRL 7.5 (GLOVE) ×2 IMPLANT
GLOVE BIOGEL PI INDICATOR 7.0 (GLOVE) ×4
GLOVE BIOGEL PI INDICATOR 7.5 (GLOVE) ×4
GLOVE SURG SS PI 7.0 STRL IVOR (GLOVE) ×3 IMPLANT
GOWN STRL NON-REIN LRG LVL3 (GOWN DISPOSABLE) ×6 IMPLANT
KIT BASIN OR (CUSTOM PROCEDURE TRAY) ×3 IMPLANT
KIT ROOM TURNOVER OR (KITS) ×3 IMPLANT
NS IRRIG 1000ML POUR BTL (IV SOLUTION) ×3 IMPLANT
PACK GENERAL/GYN (CUSTOM PROCEDURE TRAY) ×3 IMPLANT
PAD ARMBOARD 7.5X6 YLW CONV (MISCELLANEOUS) ×3 IMPLANT
SPONGE GAUZE 4X4 12PLY (GAUZE/BANDAGES/DRESSINGS) IMPLANT
SPONGE GAUZE 4X4 12PLY STER LF (GAUZE/BANDAGES/DRESSINGS) ×3 IMPLANT
SPONGE LAP 18X18 X RAY DECT (DISPOSABLE) ×3 IMPLANT
SUT VIC AB 2-0 SH 18 (SUTURE) ×3 IMPLANT
TAPE CLOTH SURG 4X10 WHT LF (GAUZE/BANDAGES/DRESSINGS) ×3 IMPLANT
TOWEL OR 17X24 6PK STRL BLUE (TOWEL DISPOSABLE) ×3 IMPLANT
TOWEL OR 17X26 10 PK STRL BLUE (TOWEL DISPOSABLE) ×3 IMPLANT

## 2013-02-18 NOTE — Anesthesia Preprocedure Evaluation (Addendum)
Anesthesia Evaluation  Patient identified by MRN, date of birth, ID band Patient awake    Reviewed: Allergy & Precautions, H&P , NPO status , Patient's Chart, lab work & pertinent test results, reviewed documented beta blocker date and time   Airway Mallampati: II TM Distance: >3 FB Neck ROM: Full    Dental  (+) Poor Dentition and Dental Advisory Given   Pulmonary asthma , COPD COPD inhaler,    Pulmonary exam normal       Cardiovascular hypertension, Pt. on medications and Pt. on home beta blockers + CAD and + Peripheral Vascular Disease  10/14 MRI: EF 55% 10/14 ECHO: EF 60-65% Cath in [place]: no reported coronary disease 1/15 ECHO: EF 60-65%, valves OK   Neuro/Psych TIA   GI/Hepatic GERD-  Controlled and Medicated,  Endo/Other  diabetes (glu 91), Insulin Dependent  Renal/GU Renal InsufficiencyRenal disease (creat 1.83)     Musculoskeletal   Abdominal   Peds  Hematology  (+) Blood dyscrasia (Hb 10.9), anemia ,   Anesthesia Other Findings   Reproductive/Obstetrics                         Anesthesia Physical Anesthesia Plan  ASA: III  Anesthesia Plan: General   Post-op Pain Management:    Induction: Intravenous  Airway Management Planned: LMA  Additional Equipment:   Intra-op Plan:   Post-operative Plan: Extubation in OR  Informed Consent: I have reviewed the patients History and Physical, chart, labs and discussed the procedure including the risks, benefits and alternatives for the proposed anesthesia with the patient or authorized representative who has indicated his/her understanding and acceptance.   Dental advisory given  Plan Discussed with: CRNA, Anesthesiologist and Surgeon  Anesthesia Plan Comments:         Anesthesia Quick Evaluation

## 2013-02-18 NOTE — Transfer of Care (Signed)
Immediate Anesthesia Transfer of Care Note  Patient: Monica Lee  Procedure(s) Performed: Procedure(s): IRRIGATION AND DEBRIDEMENT THIGH WOUND (Left)  Patient Location: PACU  Anesthesia Type:General  Level of Consciousness: awake, alert  and patient cooperative  Airway & Oxygen Therapy: Patient Spontanous Breathing and Patient connected to nasal cannula oxygen  Post-op Assessment: Report given to PACU RN, Post -op Vital signs reviewed and stable and Patient moving all extremities  Post vital signs: Reviewed and stable  Complications: No apparent anesthesia complications

## 2013-02-18 NOTE — Preoperative (Signed)
Beta Blockers   Reason not to administer Beta Blockers:Lopressor 2116 02/18/2012

## 2013-02-18 NOTE — Op Note (Signed)
Preoperative diagnosis: Left thigh abscess s/p incision and debridement Postoperative diagnosis: necrotizing soft tissue infection Procedure: Debridement of 10x12 cm area and 8x20 cm area of skin, subq tissue and fascia Surgeon: Dr. Serita Grammes Anesthesia: Gen. Specimens: None Drains: None Estimated blood loss: 885 cc Complications: None Sponge needle count was correct at completion Disposition to step down   Indications: This is a 63 year old female that I admitted over the weekend who has a renal transplant on immunosuppression as well as recently getting chemotherapy for breast cancer. She had a left thigh infected hematoma that was incised, drained, and debrided by my partner Dr. Brantley Stage. She initially got better from this but then her white blood cell count then began to elevate and it appeared she had a separate area with a bulla on inferior to this on her left leg. I discussed going back to the operating room and debrided this more.  Procedure: After informed consent was obtained the patient was taken to the operating room. She was placed under general anesthesia without complication. She was already on antibiotics on the floor. She had sequential compression devices on her legs. Her left leg was then prepped and draped in the standard sterile surgical fashion. Surgical timeout was then performed.  I then began to debride the inferior portion of this. I used cautery to remove the entire bullae as well as the red skin. Once I had done this there was a dishwater appearance to the fascia and it was gray. I then proceeded to remove this large segment of skin subcutaneous tissue and fascia. I did this all the way down to muscle. The muscle was viable. Remove this until I reached healthy bleeding tissue. I then proceeded to remove the fascia from the entire wound that had been present before. Also removed going lateral almost to the posterior aspect of the thigh. Superiorly there was still  another tunnel where there was watery fascia as well subcutaneous tissue I debrided this area as well. The remainder of this appears healthy at this point. I spent some time obtaining hemostasis with cautery and Vicryl suture. I then placed a dressing. This was covered. She'll remain in step down overnight to be monitored.

## 2013-02-18 NOTE — Progress Notes (Signed)
4 Days Post-Op  Subjective: Feels ok  Objective: Vital signs in last 24 hours: Temp:  [97.4 F (36.3 C)-98.2 F (36.8 C)] 97.4 F (36.3 C) (02/05 0500) Pulse Rate:  [74-77] 76 (02/05 0500) Resp:  [18-20] 20 (02/05 0500) BP: (146-154)/(74-82) 146/74 mmHg (02/05 0500) SpO2:  [92 %-97 %] 92 % (02/05 0500) Last BM Date: 02/17/13  Intake/Output from previous day: 02/04 0701 - 02/05 0700 In: 2761.7 [P.O.:1440; I.V.:1171.7; IV Piggyback:150] Out: -  Intake/Output this shift:    lle wound clean but inferior to this there is a bulla and some erythema, tender to palpation   Lab Results:   Recent Labs  02/17/13 0135  WBC 11.1*  HGB 10.9*  HCT 33.5*  PLT 191   BMET  Recent Labs  02/17/13 0135  NA 134*  K 4.1  CL 94*  CO2 27  GLUCOSE 275*  BUN 28*  CREATININE 1.83*  CALCIUM 8.4   PT/INR No results found for this basename: LABPROT, INR,  in the last 72 hours ABG No results found for this basename: PHART, PCO2, PO2, HCO3,  in the last 72 hours  Studies/Results: No results found.  Anti-infectives: Anti-infectives   Start     Dose/Rate Route Frequency Ordered Stop   02/17/13 2000  vancomycin (VANCOCIN) 1,250 mg in sodium chloride 0.9 % 250 mL IVPB     1,250 mg 166.7 mL/hr over 90 Minutes Intravenous Every 24 hours 02/17/13 0237     02/15/13 0200  vancomycin (VANCOCIN) IVPB 1000 mg/200 mL premix  Status:  Discontinued     1,000 mg 200 mL/hr over 60 Minutes Intravenous Every 24 hours 02/14/13 0814 02/17/13 0237   02/14/13 1400  piperacillin-tazobactam (ZOSYN) IVPB 3.375 g     3.375 g 12.5 mL/hr over 240 Minutes Intravenous 3 times per day 02/14/13 0754     02/14/13 0515  piperacillin-tazobactam (ZOSYN) IVPB 3.375 g     3.375 g 100 mL/hr over 30 Minutes Intravenous  Once 02/14/13 0508 02/14/13 0638   02/14/13 0300  vancomycin (VANCOCIN) IVPB 1000 mg/200 mL premix     1,000 mg 200 mL/hr over 60 Minutes Intravenous  Once 02/14/13 0248 02/14/13 0443       Assessment/Plan: lle wound  Needs further excision/debridement today  [facility] 02/18/2013

## 2013-02-18 NOTE — Anesthesia Procedure Notes (Addendum)
Procedure Name: LMA Insertion Date/Time: 02/18/2013 3:05 PM Performed by: Terrill Mohr Pre-anesthesia Checklist: Emergency Drugs available, Patient identified, Suction available and Patient being monitored Patient Re-evaluated:Patient Re-evaluated prior to inductionOxygen Delivery Method: Circle system utilized Preoxygenation: Pre-oxygenation with 100% oxygen Intubation Type: IV induction Ventilation: Mask ventilation without difficulty LMA: LMA inserted LMA Size: 4.0 Number of attempts: 1 Placement Confirmation: breath sounds checked- equal and bilateral and positive ETCO2 Tube secured with: taped across cheeks. Dental Injury: Teeth and Oropharynx as per pre-operative assessment

## 2013-02-18 NOTE — Progress Notes (Signed)
[facility]                                                                              Patient Demographics  Monica Lee, is a 63 y.o. female, DOB - 07-03-1950, [id number]  Admit date - 02/14/2013   Admitting Physician Monica Baker, MD  Outpatient Primary MD for the patient is Monica Cower, MD  LOS - 4   Chief Complaint  Patient presents with  . 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  Going to OR. TODAY.   Assessment & Plan  Abscess of leg / traumatic hematoma of thigh  -continue Vanc and zosyn - no speciation on culture data thus far  -Continue Dilaudid (PRN) and Hydrocodone (routine) for pain control  -Continue dressing changes and monitoring, may consider wound vac in the next 24 hours (per surgery)  Type 2 diabetes mellitus with circulatory disorder  -Diabetes coordinator following -Will increase Lantus to 40 units QHS, Novolog 4units TID with meals -Will continue to monitor CBG (last 3)   Recent Labs  02/17/13 2156 02/18/13 0748 02/18/13 1216  GLUCAP 217* 143* 22      ?Newly diagnosed hypothyroidism  -TSH markedly elevated at ~35  -FT4 0.38, FT3 1.6 -Continue Synthroid -Will need outpatient follow up  CAD, NATIVE VESSEL  -Patient chest pain free at this time -ASA was on hold for procedure, however recently restarted -Will continue to monitor for bleeding complications, and monitor H/H  Chronic diastolic heart failure / CKD  -lasix held and IVF initiated by Renal team 02/15/13 - repeat renal parameters today.   s/p KIDNEY TRANSPLANTATION  -Continue immunosurpressives, tacrolimus, prednisone, azathioprine -Creatinine stable, 1.24  Breast cancer of lower-outer quadrant of right female breast  -Dr. Humphrey Lee placed as  consultant in [place] with Dr. Humphrey Lee regarding chemo (as patient is due for chemo Thursday), this will be held for now. -Patient will need to follow up with Dr. Humphrey Lee once discharged   Hypotension with h/o Essential hypertension, benign  -Continue Metoprolol with holding parameters    Blind Left eye   Asthma  -stable   GERD  -continue protonix  Code Status: Full  Family Communication: None at bedside.  Disposition Plan: Admitted  Time Spent in minutes   30 minutes  Procedures  2/1- I and D left thigh  Consults   General Surgery  Nephrology Oncology, Dr. Humphrey Lee, via phone  DVT Prophylaxis  SCDs  Lab Results  Component Value Date   PLT 191 02/17/2013    Medications  Scheduled Meds: . aspirin EC  81 mg Oral Daily  . azaTHIOprine  50 mg Oral BID  . cinacalcet  30 mg Oral Daily  . docusate sodium  100 mg Oral BID  . feeding supplement (GLUCERNA SHAKE)  237 mL Oral BID BM  . insulin aspart  0-9 Units Subcutaneous TID WC  . insulin aspart  4 Units Subcutaneous TID WC  . insulin glargine  40 Units Subcutaneous QHS  .  isosorbide mononitrate  60 mg Oral Daily  . levothyroxine  50 mcg Oral QAC breakfast  . magnesium oxide  200 mg Oral Daily  . metoprolol  25 mg Oral BID  . pantoprazole  40 mg Oral Daily  . piperacillin-tazobactam (ZOSYN)  IV  3.375 g Intravenous Q8H  . polyethylene glycol  17 g Oral Daily  . prednisoLONE acetate  1 drop Left Eye TID  . predniSONE  10 mg Oral Daily  . simvastatin  5 mg Oral q1800  . tacrolimus  9 mg Oral BID  . timolol  1 drop Right Eye BID  . vancomycin  1,250 mg Intravenous Q24H   Continuous Infusions: . sodium chloride 50 mL/hr at 02/17/13 1434   PRN Meds:.acetaminophen, acetaminophen, albuterol, HYDROcodone-acetaminophen, HYDROmorphone (DILAUDID) injection, LORazepam, ondansetron (ZOFRAN) IV, ondansetron  Antibiotics    Anti-infectives   Start     Dose/Rate Route Frequency Ordered Stop   02/17/13 2000  vancomycin  (VANCOCIN) 1,250 mg in sodium chloride 0.9 % 250 mL IVPB     1,250 mg 166.7 mL/hr over 90 Minutes Intravenous Every 24 hours 02/17/13 0237     02/15/13 0200  vancomycin (VANCOCIN) IVPB 1000 mg/200 mL premix  Status:  Discontinued     1,000 mg 200 mL/hr over 60 Minutes Intravenous Every 24 hours 02/14/13 0814 02/17/13 0237   02/14/13 1400  piperacillin-tazobactam (ZOSYN) IVPB 3.375 g     3.375 g 12.5 mL/hr over 240 Minutes Intravenous 3 times per day 02/14/13 0754     02/14/13 0515  piperacillin-tazobactam (ZOSYN) IVPB 3.375 g     3.375 g 100 mL/hr over 30 Minutes Intravenous  Once 02/14/13 0508 02/14/13 0638   02/14/13 0300  vancomycin (VANCOCIN) IVPB 1000 mg/200 mL premix     1,000 mg 200 mL/hr over 60 Minutes Intravenous  Once 02/14/13 0248 02/14/13 0443        Subjective:   Monica Lee seen and examined today.  Patient has no complaints today and states she feels fine.  Denies chest pain, SOB, abdominal pain.  Objective:   Filed Vitals:   02/17/13 2120 02/18/13 0500 02/18/13 0800 02/18/13 1000  BP: 154/82 146/74 136/71 154/72  Pulse: 74 76 75 76  Temp: 98.2 F (36.8 C) 97.4 F (36.3 C) 98.2 F (36.8 C) 98.1 F (36.7 C)  TempSrc: Oral Oral Oral Oral  Resp: 20 20    Height:      Weight:      SpO2: 96% 92% 100% 100%    Wt Readings from Last 3 Encounters:  02/14/13 73.936 kg (163 lb)  02/14/13 73.936 kg (163 lb)  02/14/13 73.936 kg (163 lb)     Intake/Output Summary (Last 24 hours) at 02/18/13 1132 Last data filed at 02/18/13 0900  Gross per 24 hour  Intake 2761.67 ml  Output      0 ml  Net 2761.67 ml    Exam  General: Well developed, well nourished, NAD, appears stated age  HEENT: NCAT, PERRLA, EOMI, Anicteic Sclera, mucous membranes moist.   Neck: Supple, no JVD, no masses  Cardiovascular: S1 S2 auscultated, no rubs, murmurs or gallops. Regular rate and rhythm.  Respiratory: Clear to auscultation bilaterally with equal chest rise  Abdomen: Soft,  nontender, nondistended, + bowel sounds  Extremities: warm dry without cyanosis clubbing or edema, Left leg dressing in place  Neuro: Awake and alert, blind.  No focal deficits.   Skin: Without rashes exudates or nodules  Psych: Appropriate mood and affect.  Data Review  Micro Results Recent Results (from the past 240 hour(s))  TECHNOLOGIST REVIEW     Status: None   Collection Time    02/09/13 10:13 AM      Result Value Range Status   Technologist Review 2% metas, occ NRBC present   Final  MRSA PCR SCREENING     Status: None   Collection Time    02/14/13  7:51 AM      Result Value Range Status   MRSA by PCR NEGATIVE  NEGATIVE Final   Comment:            The GeneXpert MRSA Assay (FDA     approved for NASAL specimens     only), is one component of a     comprehensive MRSA colonization     surveillance program. It is not     intended to diagnose MRSA     infection nor to guide or     monitor treatment for     MRSA infections.  ANAEROBIC CULTURE     Status: None   Collection Time    02/14/13 11:20 AM      Result Value Range Status   Specimen Description ABSCESS LEFT THIGH   Final   Special Requests NONE   Final   Gram Stain     Final   Value: NO WBC SEEN     NO SQUAMOUS EPITHELIAL CELLS SEEN     MODERATE GRAM NEGATIVE RODS     MODERATE GRAM POSITIVE RODS     RARE GRAM POSITIVE COCCI IN PAIRS   Culture     Final   Value: NO ANAEROBES ISOLATED; CULTURE IN PROGRESS FOR 5 DAYS     Performed at [facility]   Report Status PENDING   Incomplete  CULTURE, ROUTINE-ABSCESS     Status: None   Collection Time    02/14/13 11:20 AM      Result Value Range Status   Specimen Description ABSCESS LEFT THIGH   Final   Special Requests NONE   Final   Gram Stain     Final   Value: RF WBC PRESENT, PREDOMINANTLY PMN     NO SQUAMOUS EPITHELIAL CELLS SEEN     ABUNDANT GRAM NEGATIVE RODS     ABUNDANT GRAM POSITIVE RODS     FEW GRAM POSITIVE COCCI IN PAIRS   Culture     Final    Value: ABUNDANT MICROAEROPHILIC STREPTOCOCCI     Note: Standardized susceptibility testing for this organism is not available.     Performed at [facility]   Report Status 02/17/2013 FINAL   Final    Radiology Reports Dg Femur Left  02/14/2013   CLINICAL DATA:  Fall with pain and swelling.  EXAM: LEFT FEMUR - 2 VIEW  COMPARISON:  Abdomen pelvis CT 02/09/2013  FINDINGS: There is an 8 cm area of subcutaneous gas in the anterior and proximal left thigh. There is no radiodense foreign body. No acute osseous findings, including fracture or osseous infection. Diffuse arterial calcification in this patient with history of diabetes.  These results were called by telephone at the time of interpretation on 02/14/2013 at 2:56 AM to Dr. Delora Fuel , who verbally acknowledged these results.  IMPRESSION: 8 cm area of subcutaneous gas in the anterior and upper thigh. If there is no skin breech or recent procedure, findings concerning for necrotizing infection.   Electronically Signed   By: Jorje Guild M.D.   On: 02/14/2013 02:56   Ct  Hip Left Wo Contrast  02/09/2013   CLINICAL DATA:  Pt with fall and left upper leg and hip pain evaluate for fracture  EXAM: CT OF THE LEFT HIP WITHOUT CONTRAST  TECHNIQUE: Multidetector CT imaging was performed according to the standard protocol. Multiplanar CT image reconstructions were also generated.  COMPARISON:  None.  FINDINGS: There is no left hip fracture or dislocation. The left superior and inferior pubic rami are intact. There is soft tissue edema within the subcutaneous fat overlying the left proximal femur with skin thickening which may be secondary to direct trauma versus cellulitis.  There is no focal fluid collection. There is no hematoma. There is no soft tissue emphysema.  There is no lytic or blastic osseous lesion.  There is peripheral vascular atherosclerotic disease.  IMPRESSION: 1. No acute osseous injury of the left hip. 2. Soft tissue edema within the  subcutaneous fat overlying the left proximal femur with skin thickening which may be secondary to direct trauma versus cellulitis.   Electronically Signed   By: Kathreen Devoid   On: 02/09/2013 12:44   Ct Femur Left Wo Contrast  02/14/2013   CLINICAL DATA:  Gas in upper thigh soft tissues.  EXAM: CT OF THE LEFT FEMUR WITHOUT CONTRAST  TECHNIQUE: Axial noncontrast 2.5 mm CT images through the left hip with coronal and sagittal reformations.  COMPARISON:  Left femur radiograph February 14, 2013.  FINDINGS: As seen on prior radiograph, there is subcutaneous emphysema along the anterolateral femoral soft tissues without subfascial extent. Associated skin thickening, and interstitial edema with thickened appearance of the fascia. Preservation the interim muscular fascia planes. No focal fluid collection. No radiopaque foreign bodies.  Dense vascular calcifications. No fracture or dislocation. No destructive bony lesions.  IMPRESSION: Left anterolateral femur subcutaneous emphysema with skin thickening and thickened fascia concerning for superficial necrotizing fasciitis, without subfascial extent. No abscess.  No acute fracture or dislocation.   Electronically Signed   By: Elon Alas   On: 02/14/2013 05:00    CBC  Recent Labs Lab 02/14/13 0127 02/15/13 0317 02/17/13 0135  WBC 3.6* 5.2 11.1*  HGB 11.4* 10.9* 10.9*  HCT 34.6* 33.3* 33.5*  PLT 132* PLATELET CLUMPS NOTED ON SMEAR, COUNT APPEARS ADEQUATE 191  MCV 91.5 91.7 91.8  MCH 30.2 30.0 29.9  MCHC 32.9 32.7 32.5  RDW 16.4* 16.4* 15.9*  LYMPHSABS 0.7  --   --   MONOABS 0.7  --   --   EOSABS 0.0  --   --   BASOSABS 0.0  --   --     Chemistries   Recent Labs Lab 02/14/13 0302 02/15/13 0317 02/17/13 0135  NA 135* 136* 134*  K 4.5 3.7 4.1  CL 94* 95* 94*  CO2 26 29 27   GLUCOSE 190* 190* 275*  BUN 26* 17 28*  CREATININE 1.56* 1.24* 1.83*  CALCIUM 8.6 8.5 8.4  MG  --  2.2  --   AST  --  26  --   ALT  --  16  --   ALKPHOS  --  69   --   BILITOT  --  0.4  --    ------------------------------------------------------------------------------------------------------------------ estimated creatinine clearance is 32.1 ml/min (by C-G formula based on Cr of 1.83). ------------------------------------------------------------------------------------------------------------------ No results found for this basename: HGBA1C,  in the last 72 hours ------------------------------------------------------------------------------------------------------------------ No results found for this basename: CHOL, HDL, LDLCALC, TRIG, CHOLHDL, LDLDIRECT,  in the last 72 hours ------------------------------------------------------------------------------------------------------------------  Recent Labs  02/15/13 1936  T3FREE  1.6*   ------------------------------------------------------------------------------------------------------------------ No results found for this basename: VITAMINB12, FOLATE, FERRITIN, TIBC, IRON, RETICCTPCT,  in the last 72 hours  Coagulation profile No results found for this basename: INR, PROTIME,  in the last 168 hours  No results found for this basename: DDIMER,  in the last 72 hours  Cardiac Enzymes No results found for this basename: CK, CKMB, TROPONINI, MYOGLOBIN,  in the last 168 hours ------------------------------------------------------------------------------------------------------------------ No components found with this basename: POCBNP,     Aladdin Kollmann D.O. on 02/18/2013 at 11:32 AM  Between 7am to 7pm - Pager - [phone]  After 7pm go to [email or web address] - password [email or web address]  And look for the night coverage person covering for me after hours  [facility] Office  [phone]

## 2013-02-18 NOTE — Anesthesia Postprocedure Evaluation (Signed)
Anesthesia Post Note  Patient: Monica Lee  Procedure(s) Performed: Procedure(s) (LRB): IRRIGATION AND DEBRIDEMENT THIGH WOUND (Left)  Anesthesia type: General  Patient location: PACU  Post pain: Pain level controlled  Post assessment: Patient's Cardiovascular Status Stable  Last Vitals:  Filed Vitals:   02/18/13 1719  BP: 140/88  Pulse: 90  Temp: 36.8 C  Resp: 17    Post vital signs: Reviewed and stable  Level of consciousness: alert  Complications: No apparent anesthesia complications

## 2013-02-19 LAB — GLUCOSE, CAPILLARY
Glucose-Capillary: 69 mg/dL — ABNORMAL LOW (ref 70–99)
Glucose-Capillary: 69 mg/dL — ABNORMAL LOW (ref 70–99)
Glucose-Capillary: 154 mg/dL — ABNORMAL HIGH (ref 70–99)
Glucose-Capillary: 172 mg/dL — ABNORMAL HIGH (ref 70–99)
Glucose-Capillary: 185 mg/dL — ABNORMAL HIGH (ref 70–99)

## 2013-02-19 LAB — BASIC METABOLIC PANEL
BUN: 16 mg/dL (ref 6–23)
CHLORIDE: 102 meq/L (ref 96–112)
CO2: 29 meq/L (ref 19–32)
Calcium: 8.2 mg/dL — ABNORMAL LOW (ref 8.4–10.5)
Creatinine, Ser: 1.13 mg/dL — ABNORMAL HIGH (ref 0.50–1.10)
GFR calc Af Amer: 59 mL/min — ABNORMAL LOW (ref >90)
GFR calc non Af Amer: 51 mL/min — ABNORMAL LOW (ref >90)
Glucose, Bld: 73 mg/dL (ref 70–99)
POTASSIUM: 3.9 meq/L (ref 3.7–5.3)
SODIUM: 142 meq/L (ref 137–147)

## 2013-02-19 MED ORDER — SODIUM CHLORIDE 0.9 % IJ SOLN
10.0000 mL | INTRAMUSCULAR | Status: DC | PRN
  Administered 2013-02-23: 10 mL

## 2013-02-19 MED ORDER — VANCOMYCIN HCL IN DEXTROSE 750-5 MG/150ML-% IV SOLN
750.0000 mg | Freq: Two times a day (BID) | INTRAVENOUS | Status: DC
  Administered 2013-02-19 – 2013-02-21 (×5): 750 mg via INTRAVENOUS
  Filled 2013-02-19 (×8): qty 150

## 2013-02-19 NOTE — Anesthesia Postprocedure Evaluation (Deleted)
Anesthesia Post Note  Patient: Monica Lee  Procedure(s) Performed: Procedure(s) (LRB): IRRIGATION AND DEBRIDEMENT THIGH WOUND (Left)  Anesthesia type: general  Patient location: PACU  Post pain: Pain level controlled  Post assessment: Patient's Cardiovascular Status Stable  Post vital signs: Reviewed and stable  Level of consciousness: sedated  Complications: No apparent anesthesia complications

## 2013-02-19 NOTE — Progress Notes (Signed)
Physical Therapy Treatment Patient Details Name: Monica Lee MRN: [id number] DOB: 10-Aug-1950 Today's Date: 02/19/2013 Time: 2353-6144 PT Time Calculation (min): 24 min  PT Assessment / Plan / Recommendation  History of Present Illness Pt adm with Lt thigh abscess and underwent I&D 2/1. PMHX includes undergoing current chemotherapy for breast Ca, Lt eye blindness, kidney transplant '08   PT Comments   Pt limited in mobility due to pain and fatigue. Pt was able to ambulate today without O2 and maintained sats in >90%. Pt will require 24/7 (A) upon acute D/C due to risk of falls. Pt may benefit from Rolator for mobility for energy conservation. Will cont to follow per POC.    Follow Up Recommendations  Home health PT;Supervision for mobility/OOB     Does the patient have the potential to tolerate intense rehabilitation     Barriers to Discharge        Equipment Recommendations  Rolling walker with 5" wheels;Other (comment) Agricultural consultant)    Recommendations for Other Services OT consult  Frequency Min 3X/week   Progress towards PT Goals Progress towards PT goals: Progressing toward goals  Plan Current plan remains appropriate    Precautions / Restrictions Precautions Precautions: Fall Precaution Comments: pt reports fell 4 weeks PTA "my Rt leg just gave out with no warning" Restrictions Weight Bearing Restrictions: No   Pertinent Vitals/Pain 8/10; patient repositioned for comfort     Mobility  Bed Mobility Overal bed mobility: Modified Independent General bed mobility comments: effortful; requries incr time  Transfers Overall transfer level: Needs assistance Equipment used: Rolling walker (2 wheeled) Transfers: Sit to/from Stand Sit to Stand: Supervision General transfer comment: pt requires incr time and effort; cues for hand placement and safety with RW Ambulation/Gait Ambulation/Gait assistance: Min guard Ambulation Distance (Feet): 125 Feet Assistive device: Rolling  walker (2 wheeled) Gait Pattern/deviations: Step-through pattern;Decreased stride length;Decreased stance time - left;Wide base of support;Decreased step length - right;Shuffle General Gait Details: pt slightly unsteady with antalgic gt due to pain in Lt LE; no noted buckling of LEs; cues for sequencing          PT Diagnosis:    PT Problem List:   PT Treatment Interventions:     PT Goals (current goals can now be found in the care plan section) Acute Rehab PT Goals Patient Stated Goal: return home PT Goal Formulation: With patient Time For Goal Achievement: 02/22/13 Potential to Achieve Goals: Good  Visit Information  Last PT Received On: 02/19/13 Assistance Needed: +1 History of Present Illness: Pt adm with Lt thigh abscess and underwent I&D 2/1. PMHX includes undergoing current chemotherapy for breast Ca, Lt eye blindness, kidney transplant '08    Subjective Data  Subjective: Pt lying supine; " im real worried because when i walked last night i was dragging my leg" Patient Stated Goal: return home   Cognition  Cognition Arousal/Alertness: Awake/alert Behavior During Therapy: WFL for tasks assessed/performed Overall Cognitive Status: Within Functional Limits for tasks assessed    Balance  Balance Overall balance assessment: Needs assistance Sitting-balance support: Feet supported;No upper extremity supported Sitting balance-Leahy Scale: Good Standing balance support: During functional activity;Bilateral upper extremity supported Standing balance-Leahy Scale: Fair Standing balance comment: UEs supported by RW   End of Session PT - End of Session Equipment Utilized During Treatment: Gait belt Activity Tolerance: Patient limited by pain;Patient limited by fatigue Patient left: in bed;with call bell/phone within reach;with family/visitor present Nurse Communication: Mobility status;Other (comment) (O2 maintained >90%)   GP  Elie Confer [place], [place] 02/19/2013,  12:57 PM

## 2013-02-19 NOTE — Progress Notes (Signed)
NUTRITION FOLLOW-UP  DOCUMENTATION CODES Per approved criteria  -Not Applicable   INTERVENTION: Continue Glucerna Shake po BID, each supplement provides 220 kcal and 10 grams of protein. Recommended continued adequate intake to promote wound healing.  NUTRITION DIAGNOSIS: Inadequate oral intake related to infection and pain in left thigh as evidenced by reported intake less than estimated needs. Improving.  Goal: Pt to meet >/= 90% of their estimated nutrition needs   Monitor:  Wt, po intake, acceptance of supplements, labs  ASSESSMENT: 63 y.o. female has a past medical history of DM; Acid reflux; S/P kidney transplant; GERD; HLD; DJD; TIA; Diabetic retinopathy; CHF.  Pt admitted with pain in her left thigh after a trauma 4 weeks ago. Work-up reveals abscess and hematoma.  Pt went to OR this morning for I&D of thigh wound.  Currently ordered for Carbohydrate Modified Medium diet with Glucerna Shake PO BID between meals. Pt reports that her appetite is improving. Pt really enjoys the Glucerna Shakes and would like to continue to receive them.   Height: Ht Readings from Last 1 Encounters:  02/14/13 5\' 5"  (1.651 m)    Weight: Wt Readings from Last 1 Encounters:  02/14/13 163 lb (73.936 kg)     BMI:  Body mass index is 27.12 kg/(m^2). Overweight  Estimated Nutritional Needs: Kcal: 2200-2350 Protein: 105-115 g Fluid: 2.2-2.4 L/day  Skin: incision on left thigh  Diet Order: Carb Control  EDUCATION NEEDS: -Education needs addressed   Intake/Output Summary (Last 24 hours) at 02/19/13 1125 Last data filed at 02/19/13 0960  Gross per 24 hour  Intake   1085 ml  Output    750 ml  Net    335 ml    Last BM: 2/5  Labs:   Recent Labs Lab 02/15/13 0317 02/17/13 0135 02/19/13 0520  NA 136* 134* 142  K 3.7 4.1 3.9  CL 95* 94* 102  CO2 29 27 29   BUN 17 28* 16  CREATININE 1.24* 1.83* 1.13*  CALCIUM 8.5 8.4 8.2*  MG 2.2  --   --   PHOS 2.7  --   --    GLUCOSE 190* 275* 73    CBG (last 3)   Recent Labs  02/18/13 2141 02/19/13 0735 02/19/13 0805  GLUCAP 145* 69* 69*    Scheduled Meds: . aspirin EC  81 mg Oral Daily  . azaTHIOprine  50 mg Oral BID  . cinacalcet  30 mg Oral Daily  . docusate sodium  100 mg Oral BID  . feeding supplement (GLUCERNA SHAKE)  237 mL Oral BID BM  . insulin aspart  0-9 Units Subcutaneous TID WC  . insulin aspart  4 Units Subcutaneous TID WC  . insulin glargine  40 Units Subcutaneous QHS  . isosorbide mononitrate  60 mg Oral Daily  . levothyroxine  50 mcg Oral QAC breakfast  . magnesium oxide  200 mg Oral Daily  . metoprolol  25 mg Oral BID  . pantoprazole  40 mg Oral Daily  . piperacillin-tazobactam (ZOSYN)  IV  3.375 g Intravenous Q8H  . polyethylene glycol  17 g Oral Daily  . prednisoLONE acetate  1 drop Left Eye TID  . predniSONE  10 mg Oral Daily  . simvastatin  5 mg Oral q1800  . tacrolimus  9 mg Oral BID  . timolol  1 drop Right Eye BID  . vancomycin  750 mg Intravenous BID    Continuous Infusions: . sodium chloride 50 mL/hr (02/19/13 0712)    Inda Coke  MS, RD, LDN Pager: [phone] After-hours pager: [phone]

## 2013-02-19 NOTE — Progress Notes (Signed)
[place] for vancomycin Indication: infected wound  Labs:  Recent Labs  02/17/13 0135 02/18/13 1840 02/19/13 0520  WBC 11.1* 9.5  --   HGB 10.9* 11.0*  --   PLT 191 211  --   CREATININE 1.83*  --  1.13*   Estimated Creatinine Clearance: 52 ml/min (by C-G formula based on Cr of 1.13).  Recent Labs  02/17/13 0135  [place] 12.7     Microbiology: Recent Results (from the past 720 hour(s))  TECHNOLOGIST REVIEW     Status: None   Collection Time    02/02/13  8:13 AM      Result Value Range Status   Technologist Review Oc myelocyte, Oc NRBC   Final  TECHNOLOGIST REVIEW     Status: None   Collection Time    02/09/13 10:13 AM      Result Value Range Status   Technologist Review 2% metas, occ NRBC present   Final  MRSA PCR SCREENING     Status: None   Collection Time    02/14/13  7:51 AM      Result Value Range Status   MRSA by PCR NEGATIVE  NEGATIVE Final   Comment:            The GeneXpert MRSA Assay (FDA     approved for NASAL specimens     only), is one component of a     comprehensive MRSA colonization     surveillance program. It is not     intended to diagnose MRSA     infection nor to guide or     monitor treatment for     MRSA infections.  ANAEROBIC CULTURE     Status: None   Collection Time    02/14/13 11:20 AM      Result Value Range Status   Specimen Description ABSCESS LEFT THIGH   Final   Special Requests NONE   Final   Gram Stain     Final   Value: NO WBC SEEN     NO SQUAMOUS EPITHELIAL CELLS SEEN     MODERATE GRAM NEGATIVE RODS     MODERATE GRAM POSITIVE RODS     RARE GRAM POSITIVE COCCI IN PAIRS   Culture     Final   Value: NO ANAEROBES ISOLATED; CULTURE IN PROGRESS FOR 5 DAYS     Performed at [facility]   Report Status PENDING   Incomplete  CULTURE, ROUTINE-ABSCESS     Status: None   Collection Time    02/14/13 11:20 AM      Result Value Range Status   Specimen Description ABSCESS LEFT THIGH    Final   Special Requests NONE   Final   Gram Stain     Final   Value: RF WBC PRESENT, PREDOMINANTLY PMN     NO SQUAMOUS EPITHELIAL CELLS SEEN     ABUNDANT GRAM NEGATIVE RODS     ABUNDANT GRAM POSITIVE RODS     FEW GRAM POSITIVE COCCI IN PAIRS   Culture     Final   Value: ABUNDANT MICROAEROPHILIC STREPTOCOCCI     Note: Standardized susceptibility testing for this organism is not available.     Performed at [facility]   Report Status 02/17/2013 FINAL   Final     Assessment: 63yo female on vancomycin and zosyn day #5 for necrotizing left thigh abscess soft tissue infection, s/p I&D yesterday. No fevers noted, wbc is now normal at 9.5.  Vancomycin level was low on 2/4 and dose adjusted. Renal function has significantly improved will adjust dose this morning and plan to recheck level over weekend.  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  Erin Hearing PharmD., BCPS Clinical Pharmacist Pager [phone] 02/19/2013 10:18 AM

## 2013-02-19 NOTE — Progress Notes (Signed)
Agree with above, will make npo after mn until we can look at wound tomorrow, wbc nl now, wound looks good

## 2013-02-19 NOTE — Care Management Note (Signed)
    Page 1 of 2   02/23/2013     10:58:14 AM   CARE MANAGEMENT NOTE 02/23/2013  Patient:  Monica Lee, Monica Lee   Account Number:  [id number]  Date Initiated:  02/15/2013  Documentation initiated by:  [facility]  Subjective/Objective Assessment:   Current CA patient revieving chemo and radiation - now with ?? necratizing fascitis to thigh post injury.     Action/Plan:   Anticipated DC Date:  02/23/2013   Anticipated DC Plan:  [place]  CM consult      Choice offered to / List presented to:             Status of service:  In process, will continue to follow Medicare Important Message given?   (If response is "NO", the following Medicare IM given date fields will be blank) Date Medicare IM given:   Date Additional Medicare IM given:    Discharge Disposition:    Per UR Regulation:  Reviewed for med. necessity/level of care/duration of stay  If discussed at [place] of Stay Meetings, dates discussed:    Comments:  Contact:   Limes,Christy Daughter [phone]   Desi, [place] [phone]   9-48-54  [facility] has application , aware discharge is for today .  Patient discharging to son Jehieli Brassell home address: [place] , [place]  phone [phone] , or  [place]  Confirmed with MD that patient will be discharging on PO antibiotics . Magdalen Spatz RN BSN [phone]  02-22-13 Consult for [facility] and [facility] RN . VAC form in shadow chart . Need MD signature , paged NP for same .  Patient confirmed face sheet information . She lives in [place] but has children who live in [place] . All patinet's MD's are in [place] , she may stay here in [place] at discharge. Explained she needs to decide as [place] as possible address at discharge in order to set up home health and [facility] VAC .  Will check back later today .   Magdalen Spatz RN SBN [phone]   02-19-13 Late Note : in LOS meeting 02-18-13 recommended LTAC screening , [facility]  and [facility] both aware. Magdalen Spatz RN BSN [phone]

## 2013-02-19 NOTE — Progress Notes (Signed)
Patient ID: Monica Lee, female   DOB: 1950-10-25, 63 y.o.   MRN: [id number] 1 Day Post-Op  Subjective: Pt feels well today.  No major c/o  Objective: Vital signs in last 24 hours: Temp:  [97.8 F (36.6 C)-98.6 F (37 C)] 97.8 F (36.6 C) (02/06 0700) Pulse Rate:  [74-95] 74 (02/06 0700) Resp:  [14-21] 15 (02/06 0700) BP: (118-182)/(39-100) 118/39 mmHg (02/06 0700) SpO2:  [96 %-100 %] 99 % (02/06 0700) Last BM Date: 02/18/13  Intake/Output from previous day: 02/05 0701 - 02/06 0700 In: 1085 [P.O.:360; I.V.:725] Out: 450 [Urine:200; Blood:250] Intake/Output this shift: Total I/O In: 240 [P.O.:240] Out: 300 [Urine:300]  PE: Skin: left thigh wound is clean.  Muscle is viable.  No necrotic fascia seen.  Tissue around her wound shows no further erythema or induration currently  Lab Results:   Recent Labs  02/17/13 0135 02/18/13 1840  WBC 11.1* 9.5  HGB 10.9* 11.0*  HCT 33.5* 34.5*  PLT 191 211   BMET  Recent Labs  02/17/13 0135 02/19/13 0520  NA 134* 142  K 4.1 3.9  CL 94* 102  CO2 27 29  GLUCOSE 275* 73  BUN 28* 16  CREATININE 1.83* 1.13*  CALCIUM 8.4 8.2*   PT/INR No results found for this basename: LABPROT, INR,  in the last 72 hours CMP     Component Value Date/Time   NA 142 02/19/2013 0520   NA 134* 02/09/2013 1014   K 3.9 02/19/2013 0520   K 4.2 02/09/2013 1014   CL 102 02/19/2013 0520   CO2 29 02/19/2013 0520   CO2 31* 02/09/2013 1014   GLUCOSE 73 02/19/2013 0520   GLUCOSE 257* 02/09/2013 1014   BUN 16 02/19/2013 0520   BUN 24.7 02/09/2013 1014   CREATININE 1.13* 02/19/2013 0520   CREATININE 1.5* 02/09/2013 1014   CALCIUM 8.2* 02/19/2013 0520   CALCIUM 9.0 02/09/2013 1014   PROT 6.0 02/15/2013 0317   PROT 6.6 02/09/2013 1014   ALBUMIN 2.2* 02/15/2013 0317   ALBUMIN 3.0* 02/09/2013 1014   AST 26 02/15/2013 0317   AST 39* 02/09/2013 1014   ALT 16 02/15/2013 0317   ALT 19 02/09/2013 1014   ALKPHOS 69 02/15/2013 0317   ALKPHOS 64 02/09/2013 1014   BILITOT 0.4 02/15/2013  0317   BILITOT 1.39* 02/09/2013 1014   GFRNONAA 51* 02/19/2013 0520   GFRAA 59* 02/19/2013 0520   Lipase     Component Value Date/Time   LIPASE 22 06/21/2009 1722       Studies/Results: No results found.  Anti-infectives: Anti-infectives   Start     Dose/Rate Route Frequency Ordered Stop   02/19/13 1130  vancomycin (VANCOCIN) IVPB 750 mg/150 ml premix     750 mg 150 mL/hr over 60 Minutes Intravenous 2 times daily 02/19/13 1020     02/17/13 2000  vancomycin (VANCOCIN) 1,250 mg in sodium chloride 0.9 % 250 mL IVPB  Status:  Discontinued     1,250 mg 166.7 mL/hr over 90 Minutes Intravenous Every 24 hours 02/17/13 0237 02/19/13 1020   02/15/13 0200  vancomycin (VANCOCIN) IVPB 1000 mg/200 mL premix  Status:  Discontinued     1,000 mg 200 mL/hr over 60 Minutes Intravenous Every 24 hours 02/14/13 0814 02/17/13 0237   02/14/13 1400  piperacillin-tazobactam (ZOSYN) IVPB 3.375 g     3.375 g 12.5 mL/hr over 240 Minutes Intravenous 3 times per day 02/14/13 0754     02/14/13 0515  piperacillin-tazobactam (ZOSYN) IVPB 3.375 g  3.375 g 100 mL/hr over 30 Minutes Intravenous  Once 02/14/13 0508 02/14/13 0638   02/14/13 0300  vancomycin (VANCOCIN) IVPB 1000 mg/200 mL premix     1,000 mg 200 mL/hr over 60 Minutes Intravenous  Once 02/14/13 0248 02/14/13 0443       Assessment/Plan  1. POD 1/5, debridement of left thigh wound  Plan: 1. Patient stable to transfer to the floor today. 2. Cont daily dressing change for today and likely BID tomorrow 3. Will recheck tomorrow as she may need further debridement, but does not appear to as of today. 4. Cont IV abx therapy   LOS: 5 days    Papa [place] E 02/19/2013, 10:33 AM Pager: [phone]

## 2013-02-19 NOTE — Progress Notes (Signed)
Utilization review completed.  

## 2013-02-19 NOTE — Progress Notes (Signed)
[facility]                                                                              Patient Demographics  Monica Lee, is a 63 y.o. female, DOB - 1950/07/31, [id number]  Admit date - 02/14/2013   Admitting Physician Monica Baker, MD  Outpatient Primary MD for the patient is Monica Cower, MD  LOS - 5   Chief Complaint  Patient presents with  . 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 - s/p I& D  Of the left thigh abscess on 2/5 for necrotizing soft tissue infection. She was transferred to step down for closer monitoring. She remains stable and pain controlled.    Assessment & Plan  Abscess of leg / traumatic hematoma of thigh  -continue Vanc and zosyn - no speciation on culture data thus far  -Continue Dilaudid (PRN) and Hydrocodone (routine) for pain control  - s/p I& D  Of the left thigh abscess on 2/5 for necrotizing soft tissue infection. She was transferred to step down for closer monitoring.  Type 2 diabetes mellitus with circulatory disorder  -Diabetes coordinator following -Will increase Lantus to 40 units QHS, Novolog 4units TID with meals -Will continue to monitor CBG (last 3)   Recent Labs  02/18/13 2141 02/19/13 0735 02/19/13 0805  GLUCAP 145* 69* 69*      ?Newly diagnosed hypothyroidism  -TSH markedly elevated at ~35  -FT4 0.38, FT3 1.6 -Continue Synthroid -Will need outpatient follow up  CAD, NATIVE VESSEL  -Patient chest pain free at this time -ASA was on hold for procedure, however recently restarted -Will continue to monitor for bleeding complications, and monitor H/H  Chronic diastolic heart failure / CKD  -lasix held and IVF initiated by Renal team 02/15/13 - repeat renal parameters today.   s/p KIDNEY  TRANSPLANTATION  -Continue immunosurpressives, tacrolimus, prednisone, azathioprine -Creatinine stable, 1.13  Breast cancer of lower-outer quadrant of right female breast  -Monica Lee placed as consultant in [place] with Monica Lee regarding chemo (as patient is due for chemo Thursday), this will be held for now. -Patient will need to follow up with Monica Lee once discharged   Hypotension with h/o Essential hypertension, benign  -Continue Metoprolol with holding parameters    Blind Left eye   Asthma  -stable   GERD  -continue protonix  Code Status: Full  Family Communication: None at bedside.  Disposition Plan: Admitted  Time Spent in minutes   30 minutes  Procedures  2/1- I and D left thigh  Consults   General Surgery  Nephrology Oncology, Monica Lee, via phone  DVT Prophylaxis  SCDs  Lab Results  Component Value Date   PLT 211 02/18/2013    Medications  Scheduled Meds: . aspirin EC  81 mg Oral Daily  . azaTHIOprine  50 mg Oral BID  . cinacalcet  30 mg Oral Daily  . docusate sodium  100 mg Oral BID  . feeding  supplement (GLUCERNA SHAKE)  237 mL Oral BID BM  . insulin aspart  0-9 Units Subcutaneous TID WC  . insulin aspart  4 Units Subcutaneous TID WC  . insulin glargine  40 Units Subcutaneous QHS  . isosorbide mononitrate  60 mg Oral Daily  . levothyroxine  50 mcg Oral QAC breakfast  . magnesium oxide  200 mg Oral Daily  . metoprolol  25 mg Oral BID  . pantoprazole  40 mg Oral Daily  . piperacillin-tazobactam (ZOSYN)  IV  3.375 g Intravenous Q8H  . polyethylene glycol  17 g Oral Daily  . prednisoLONE acetate  1 drop Left Eye TID  . predniSONE  10 mg Oral Daily  . simvastatin  5 mg Oral q1800  . tacrolimus  9 mg Oral BID  . timolol  1 drop Right Eye BID  . vancomycin  1,250 mg Intravenous Q24H   Continuous Infusions: . sodium chloride 50 mL/hr (02/19/13 0712)   PRN Meds:.acetaminophen, acetaminophen, albuterol, HYDROcodone-acetaminophen,  HYDROmorphone (DILAUDID) injection, LORazepam, ondansetron (ZOFRAN) IV, ondansetron  Antibiotics    Anti-infectives   Start     Dose/Rate Route Frequency Ordered Stop   02/17/13 2000  vancomycin (VANCOCIN) 1,250 mg in sodium chloride 0.9 % 250 mL IVPB     1,250 mg 166.7 mL/hr over 90 Minutes Intravenous Every 24 hours 02/17/13 0237     02/15/13 0200  vancomycin (VANCOCIN) IVPB 1000 mg/200 mL premix  Status:  Discontinued     1,000 mg 200 mL/hr over 60 Minutes Intravenous Every 24 hours 02/14/13 0814 02/17/13 0237   02/14/13 1400  piperacillin-tazobactam (ZOSYN) IVPB 3.375 g     3.375 g 12.5 mL/hr over 240 Minutes Intravenous 3 times per day 02/14/13 0754     02/14/13 0515  piperacillin-tazobactam (ZOSYN) IVPB 3.375 g     3.375 g 100 mL/hr over 30 Minutes Intravenous  Once 02/14/13 0508 02/14/13 0638   02/14/13 0300  vancomycin (VANCOCIN) IVPB 1000 mg/200 mL premix     1,000 mg 200 mL/hr over 60 Minutes Intravenous  Once 02/14/13 0248 02/14/13 0443        Subjective:   Monica Lee seen and examined today.  Patient has no complaints today and states she feels fine.  Denies chest pain, SOB, abdominal pain.  Objective:   Filed Vitals:   02/18/13 2000 02/18/13 2354 02/19/13 0400 02/19/13 0700  BP: 142/86 123/68  118/39  Pulse: 91 86  74  Temp: 98.1 F (36.7 C) 97.9 F (36.6 C) 98.4 F (36.9 C) 97.8 F (36.6 C)  TempSrc: Oral Oral Oral Oral  Resp: 21 16  15   Height:      Weight:      SpO2: 98% 99%  99%    Wt Readings from Last 3 Encounters:  02/14/13 73.936 kg (163 lb)  02/14/13 73.936 kg (163 lb)  02/14/13 73.936 kg (163 lb)     Intake/Output Summary (Last 24 hours) at 02/19/13 0944 Last data filed at 02/19/13 0347  Gross per 24 hour  Intake   1085 ml  Output    750 ml  Net    335 ml    Exam  General: Well developed, well nourished, NAD, appears stated age  HEENT: NCAT, PERRLA, EOMI, Anicteic Sclera, mucous membranes moist.   Neck: Supple, no JVD, no  masses  Cardiovascular: S1 S2 auscultated, no rubs, murmurs or gallops. Regular rate and rhythm.  Respiratory: Clear to auscultation bilaterally with equal chest rise  Abdomen: Soft, nontender, nondistended, + bowel  sounds  Extremities: warm dry without cyanosis clubbing or edema, Left leg dressing in place  Neuro: Awake and alert, blind.  No focal deficits.   Skin: Without rashes exudates or nodules  Psych: Appropriate mood and affect.  Data Review   Micro Results Recent Results (from the past 240 hour(s))  TECHNOLOGIST REVIEW     Status: None   Collection Time    02/09/13 10:13 AM      Result Value Range Status   Technologist Review 2% metas, occ NRBC present   Final  MRSA PCR SCREENING     Status: None   Collection Time    02/14/13  7:51 AM      Result Value Range Status   MRSA by PCR NEGATIVE  NEGATIVE Final   Comment:            The GeneXpert MRSA Assay (FDA     approved for NASAL specimens     only), is one component of a     comprehensive MRSA colonization     surveillance program. It is not     intended to diagnose MRSA     infection nor to guide or     monitor treatment for     MRSA infections.  ANAEROBIC CULTURE     Status: None   Collection Time    02/14/13 11:20 AM      Result Value Range Status   Specimen Description ABSCESS LEFT THIGH   Final   Special Requests NONE   Final   Gram Stain     Final   Value: NO WBC SEEN     NO SQUAMOUS EPITHELIAL CELLS SEEN     MODERATE GRAM NEGATIVE RODS     MODERATE GRAM POSITIVE RODS     RARE GRAM POSITIVE COCCI IN PAIRS   Culture     Final   Value: NO ANAEROBES ISOLATED; CULTURE IN PROGRESS FOR 5 DAYS     Performed at [facility]   Report Status PENDING   Incomplete  CULTURE, ROUTINE-ABSCESS     Status: None   Collection Time    02/14/13 11:20 AM      Result Value Range Status   Specimen Description ABSCESS LEFT THIGH   Final   Special Requests NONE   Final   Gram Stain     Final   Value: RF WBC  PRESENT, PREDOMINANTLY PMN     NO SQUAMOUS EPITHELIAL CELLS SEEN     ABUNDANT GRAM NEGATIVE RODS     ABUNDANT GRAM POSITIVE RODS     FEW GRAM POSITIVE COCCI IN PAIRS   Culture     Final   Value: ABUNDANT MICROAEROPHILIC STREPTOCOCCI     Note: Standardized susceptibility testing for this organism is not available.     Performed at [facility]   Report Status 02/17/2013 FINAL   Final    Radiology Reports Dg Femur Left  02/14/2013   CLINICAL DATA:  Fall with pain and swelling.  EXAM: LEFT FEMUR - 2 VIEW  COMPARISON:  Abdomen pelvis CT 02/09/2013  FINDINGS: There is an 8 cm area of subcutaneous gas in the anterior and proximal left thigh. There is no radiodense foreign body. No acute osseous findings, including fracture or osseous infection. Diffuse arterial calcification in this patient with history of diabetes.  These results were called by telephone at the time of interpretation on 02/14/2013 at 2:56 AM to Dr. Delora Fuel , who verbally acknowledged these results.  IMPRESSION: 8  cm area of subcutaneous gas in the anterior and upper thigh. If there is no skin breech or recent procedure, findings concerning for necrotizing infection.   Electronically Signed   By: Jorje Guild M.D.   On: 02/14/2013 02:56   Ct Hip Left Wo Contrast  02/09/2013   CLINICAL DATA:  Pt with fall and left upper leg and hip pain evaluate for fracture  EXAM: CT OF THE LEFT HIP WITHOUT CONTRAST  TECHNIQUE: Multidetector CT imaging was performed according to the standard protocol. Multiplanar CT image reconstructions were also generated.  COMPARISON:  None.  FINDINGS: There is no left hip fracture or dislocation. The left superior and inferior pubic rami are intact. There is soft tissue edema within the subcutaneous fat overlying the left proximal femur with skin thickening which may be secondary to direct trauma versus cellulitis.  There is no focal fluid collection. There is no hematoma. There is no soft tissue  emphysema.  There is no lytic or blastic osseous lesion.  There is peripheral vascular atherosclerotic disease.  IMPRESSION: 1. No acute osseous injury of the left hip. 2. Soft tissue edema within the subcutaneous fat overlying the left proximal femur with skin thickening which may be secondary to direct trauma versus cellulitis.   Electronically Signed   By: Kathreen Devoid   On: 02/09/2013 12:44   Ct Femur Left Wo Contrast  02/14/2013   CLINICAL DATA:  Gas in upper thigh soft tissues.  EXAM: CT OF THE LEFT FEMUR WITHOUT CONTRAST  TECHNIQUE: Axial noncontrast 2.5 mm CT images through the left hip with coronal and sagittal reformations.  COMPARISON:  Left femur radiograph February 14, 2013.  FINDINGS: As seen on prior radiograph, there is subcutaneous emphysema along the anterolateral femoral soft tissues without subfascial extent. Associated skin thickening, and interstitial edema with thickened appearance of the fascia. Preservation the interim muscular fascia planes. No focal fluid collection. No radiopaque foreign bodies.  Dense vascular calcifications. No fracture or dislocation. No destructive bony lesions.  IMPRESSION: Left anterolateral femur subcutaneous emphysema with skin thickening and thickened fascia concerning for superficial necrotizing fasciitis, without subfascial extent. No abscess.  No acute fracture or dislocation.   Electronically Signed   By: Elon Alas   On: 02/14/2013 05:00    CBC  Recent Labs Lab 02/14/13 0127 02/15/13 0317 02/17/13 0135 02/18/13 1840  WBC 3.6* 5.2 11.1* 9.5  HGB 11.4* 10.9* 10.9* 11.0*  HCT 34.6* 33.3* 33.5* 34.5*  PLT 132* PLATELET CLUMPS NOTED ON SMEAR, COUNT APPEARS ADEQUATE 191 211  MCV 91.5 91.7 91.8 93.2  MCH 30.2 30.0 29.9 29.7  MCHC 32.9 32.7 32.5 31.9  RDW 16.4* 16.4* 15.9* 16.3*  LYMPHSABS 0.7  --   --   --   MONOABS 0.7  --   --   --   EOSABS 0.0  --   --   --   BASOSABS 0.0  --   --   --     Chemistries   Recent Labs Lab  02/14/13 0302 02/15/13 0317 02/17/13 0135 02/19/13 0520  NA 135* 136* 134* 142  K 4.5 3.7 4.1 3.9  CL 94* 95* 94* 102  CO2 26 29 27 29   GLUCOSE 190* 190* 275* 73  BUN 26* 17 28* 16  CREATININE 1.56* 1.24* 1.83* 1.13*  CALCIUM 8.6 8.5 8.4 8.2*  MG  --  2.2  --   --   AST  --  26  --   --   ALT  --  16  --   --   ALKPHOS  --  69  --   --   BILITOT  --  0.4  --   --    ------------------------------------------------------------------------------------------------------------------ estimated creatinine clearance is 52 ml/min (by C-G formula based on Cr of 1.13). ------------------------------------------------------------------------------------------------------------------ No results found for this basename: HGBA1C,  in the last 72 hours ------------------------------------------------------------------------------------------------------------------ No results found for this basename: CHOL, HDL, LDLCALC, TRIG, CHOLHDL, LDLDIRECT,  in the last 72 hours ------------------------------------------------------------------------------------------------------------------ No results found for this basename: TSH, T4TOTAL, FREET3, T3FREE, THYROIDAB,  in the last 72 hours ------------------------------------------------------------------------------------------------------------------ No results found for this basename: VITAMINB12, FOLATE, FERRITIN, TIBC, IRON, RETICCTPCT,  in the last 72 hours  Coagulation profile No results found for this basename: INR, PROTIME,  in the last 168 hours  No results found for this basename: DDIMER,  in the last 72 hours  Cardiac Enzymes No results found for this basename: CK, CKMB, TROPONINI, MYOGLOBIN,  in the last 168 hours ------------------------------------------------------------------------------------------------------------------ No components found with this basename: POCBNP,     Gurney Balthazor D.O. on 02/19/2013 at 9:44 AM  Between 7am to 7pm -  Pager - [phone]  After 7pm go to [email or web address] - password [email or web address]  And look for the night coverage person covering for me after hours  [facility] Office  [phone]

## 2013-02-20 DIAGNOSIS — I214 Non-ST elevation (NSTEMI) myocardial infarction: Secondary | ICD-10-CM

## 2013-02-20 DIAGNOSIS — J9600 Acute respiratory failure, unspecified whether with hypoxia or hypercapnia: Secondary | ICD-10-CM

## 2013-02-20 DIAGNOSIS — I5033 Acute on chronic diastolic (congestive) heart failure: Secondary | ICD-10-CM

## 2013-02-20 LAB — GLUCOSE, CAPILLARY
GLUCOSE-CAPILLARY: 162 mg/dL — AB (ref 70–99)
Glucose-Capillary: 141 mg/dL — ABNORMAL HIGH (ref 70–99)
Glucose-Capillary: 110 mg/dL — ABNORMAL HIGH (ref 70–99)
Glucose-Capillary: 101 mg/dL — ABNORMAL HIGH (ref 70–99)
Glucose-Capillary: 189 mg/dL — ABNORMAL HIGH (ref 70–99)

## 2013-02-20 LAB — ANAEROBIC CULTURE: Gram Stain: NONE SEEN

## 2013-02-20 NOTE — Progress Notes (Signed)
Patient ID: Monica Lee, female   DOB: [birth year], 63 y.o.   MRN: [id number] 2 Days Post-Op  Subjective: Pt feels well today.  No complaints.  Objective: Vital signs in last 24 hours: Temp:  [98 F (36.7 C)-98.8 F (37.1 C)] 98.8 F (37.1 C) (02/07 0840) Pulse Rate:  [76-94] 76 (02/07 0840) Resp:  [10-19] 18 (02/07 0840) BP: (108-148)/(42-71) 148/67 mmHg (02/07 0840) SpO2:  [95 %-100 %] 100 % (02/07 0840) Last BM Date: 02/19/13  Intake/Output from previous day: 02/06 0701 - 02/07 0700 In: 600 [P.O.:600] Out: 652 [Urine:651; Stool:1] Intake/Output this shift:    PE: Skin: left thigh wound is clean. There is no further evidence of necrosis or infection.  The surrounding tissues are soft and nonerythematous.  Muscles are clean and pink.  Lab Results:   Recent Labs  02/18/13 1840  WBC 9.5  HGB 11.0*  HCT 34.5*  PLT 211   BMET  Recent Labs  02/19/13 0520  NA 142  K 3.9  CL 102  CO2 29  GLUCOSE 73  BUN 16  CREATININE 1.13*  CALCIUM 8.2*   PT/INR No results found for this basename: LABPROT, INR,  in the last 72 hours CMP     Component Value Date/Time   NA 142 02/19/2013 0520   NA 134* 02/09/2013 1014   K 3.9 02/19/2013 0520   K 4.2 02/09/2013 1014   CL 102 02/19/2013 0520   CO2 29 02/19/2013 0520   CO2 31* 02/09/2013 1014   GLUCOSE 73 02/19/2013 0520   GLUCOSE 257* 02/09/2013 1014   BUN 16 02/19/2013 0520   BUN 24.7 02/09/2013 1014   CREATININE 1.13* 02/19/2013 0520   CREATININE 1.5* 02/09/2013 1014   CALCIUM 8.2* 02/19/2013 0520   CALCIUM 9.0 02/09/2013 1014   PROT 6.0 02/15/2013 0317   PROT 6.6 02/09/2013 1014   ALBUMIN 2.2* 02/15/2013 0317   ALBUMIN 3.0* 02/09/2013 1014   AST 26 02/15/2013 0317   AST 39* 02/09/2013 1014   ALT 16 02/15/2013 0317   ALT 19 02/09/2013 1014   ALKPHOS 69 02/15/2013 0317   ALKPHOS 64 02/09/2013 1014   BILITOT 0.4 02/15/2013 0317   BILITOT 1.39* 02/09/2013 1014   GFRNONAA 51* 02/19/2013 0520   GFRAA 59* 02/19/2013 0520   Lipase     Component Value  Date/Time   LIPASE 22 06/21/2009 1722       Studies/Results: No results found.  Anti-infectives: Anti-infectives   Start     Dose/Rate Route Frequency Ordered Stop   02/19/13 1130  vancomycin (VANCOCIN) IVPB 750 mg/150 ml premix     750 mg 150 mL/hr over 60 Minutes Intravenous 2 times daily 02/19/13 1020     02/17/13 2000  vancomycin (VANCOCIN) 1,250 mg in sodium chloride 0.9 % 250 mL IVPB  Status:  Discontinued     1,250 mg 166.7 mL/hr over 90 Minutes Intravenous Every 24 hours 02/17/13 0237 02/19/13 1020   02/15/13 0200  vancomycin (VANCOCIN) IVPB 1000 mg/200 mL premix  Status:  Discontinued     1,000 mg 200 mL/hr over 60 Minutes Intravenous Every 24 hours 02/14/13 0814 02/17/13 0237   02/14/13 1400  piperacillin-tazobactam (ZOSYN) IVPB 3.375 g     3.375 g 12.5 mL/hr over 240 Minutes Intravenous 3 times per day 02/14/13 0754     02/14/13 0515  piperacillin-tazobactam (ZOSYN) IVPB 3.375 g     3.375 g 100 mL/hr over 30 Minutes Intravenous  Once 02/14/13 0508 02/14/13 0638   02/14/13 0300  vancomycin (VANCOCIN) IVPB 1000 mg/200 mL premix     1,000 mg 200 mL/hr over 60 Minutes Intravenous  Once 02/14/13 0248 02/14/13 0443       Assessment/Plan  1.POD 2/6, debridement of left thigh wound for necrotizing fasciitis  Plan: 1. Wound looks really good and clean today.  Do not see a need for any further surgery at this time.  She can eat today.  NS WD BID dressing changes.  Hopefully we can place a VAC on Monday.     LOS: 6 days    Caylen Kuwahara E 02/20/2013, 10:19 AM Pager: [phone]

## 2013-02-20 NOTE — Progress Notes (Signed)
Patient ID: Monica Lee  female  FXT:[id number]    DOB: 1950/03/02    DOA: 02/14/2013  PCP: [name], MD  Assessment/Plan: Principal Problem:   Cellulitis and abscess of leg, traumatic hematoma of thigh  -continue Vanc and zosy, culture data shows MICROAEROPHILIC STREPTOCOCCI - Continue Dilaudid (PRN) and Hydrocodone (routine) for pain control  - s/p I& D Of the left thigh abscess on 2/5 for necrotizing soft tissue infection. She was transferred to step down for closer monitoring. Currently receiving wound care and possible wound vac vs any repeat I&D, per surgery service  Active Problems:   Type 2 diabetes mellitus  - Continue Lantus, NovoLog, sliding scale insulin    CAD, NATIVE VESSEL - No chest pain, continue monitoring, stable    Chronic diastolic heart failure -Currently compensated and stable  Hypothyroidism - TSH ~35,FT4 0.38, FT3 1.6  -Continue Synthroid, will need outpatient follow up  s/p KIDNEY TRANSPLANTATION  -Continue immunosurpressives, tacrolimus, prednisone, azathioprine  -Creatinine stable, 1.13    Breast cancer of lower-outer quadrant of right female breast - Dr Karleen Hampshire d/w Dr. Humphrey Rolls regarding chemo (as patient is due for chemo Thursday), this will be held for now    Essential hypertension, benign: Stable  DVT Prophylaxis:  Code Status:  Family Communication:  Disposition: TBD    Subjective:  Any specific complaints, wound care by surgery, improving  Objective: Weight change:   Intake/Output Summary (Last 24 hours) at 02/20/13 1227 Last data filed at 02/20/13 1200  Gross per 24 hour  Intake   1230 ml  Output    352 ml  Net    878 ml   Blood pressure 148/67, pulse 76, temperature 98.8 F (37.1 C), temperature source Oral, resp. rate 18, height 5\' 5"  (1.651 m), weight 73.936 kg (163 lb), SpO2 100.00%.  Physical Exam: General: Alert and awake, oriented not in any acute distress. CVS: S1-S2 clear, no murmur rubs or gallops Chest: clear to  auscultation bilaterally, no wheezing, rales or rhonchi Abdomen: soft nontender, nondistended, normal bowel sounds  Extremities: no cyanosis, clubbing or edema noted bilaterally, left  thigh wound inspected Neuro: Cranial nerves II-XII intact, no focal neurological deficits  Lab Results: Basic Metabolic Panel:  Recent Labs Lab 02/15/13 0317 02/17/13 0135 02/19/13 0520  NA 136* 134* 142  K 3.7 4.1 3.9  CL 95* 94* 102  CO2 29 27 29   GLUCOSE 190* 275* 73  BUN 17 28* 16  CREATININE 1.24* 1.83* 1.13*  CALCIUM 8.5 8.4 8.2*  MG 2.2  --   --   PHOS 2.7  --   --    Liver Function Tests:  Recent Labs Lab 02/15/13 0317  AST 26  ALT 16  ALKPHOS 69  BILITOT 0.4  PROT 6.0  ALBUMIN 2.2*   No results found for this basename: LIPASE, AMYLASE,  in the last 168 hours No results found for this basename: AMMONIA,  in the last 168 hours CBC:  Recent Labs Lab 02/14/13 0127  02/17/13 0135 02/18/13 1840  WBC 3.6*  < > 11.1* 9.5  NEUTROABS 2.1  --   --   --   HGB 11.4*  < > 10.9* 11.0*  HCT 34.6*  < > 33.5* 34.5*  MCV 91.5  < > 91.8 93.2  PLT 132*  < > 191 211  < > = values in this interval not displayed. Cardiac Enzymes: No results found for this basename: CKTOTAL, CKMB, CKMBINDEX, TROPONINI,  in the last 168 hours BNP: No components  found with this basename: POCBNP,  CBG:  Recent Labs Lab 02/19/13 1617 02/19/13 2022 02/20/13 0546 02/20/13 0733 02/20/13 1137  GLUCAP 172* 185* 141* 110* 101*     Micro Results: Recent Results (from the past 240 hour(s))  MRSA PCR SCREENING     Status: None   Collection Time    02/14/13  7:51 AM      Result Value Range Status   MRSA by PCR NEGATIVE  NEGATIVE Final   Comment:            The GeneXpert MRSA Assay (FDA     approved for NASAL specimens     only), is one component of a     comprehensive MRSA colonization     surveillance program. It is not     intended to diagnose MRSA     infection nor to guide or     monitor  treatment for     MRSA infections.  ANAEROBIC CULTURE     Status: None   Collection Time    02/14/13 11:20 AM      Result Value Range Status   Specimen Description ABSCESS LEFT THIGH   Final   Special Requests NONE   Final   Gram Stain     Final   Value: NO WBC SEEN     NO SQUAMOUS EPITHELIAL CELLS SEEN     MODERATE GRAM NEGATIVE RODS     MODERATE GRAM POSITIVE RODS     RARE GRAM POSITIVE COCCI IN PAIRS   Culture     Final   Value: NO ANAEROBES ISOLATED; CULTURE IN PROGRESS FOR 5 DAYS     Performed at [facility]   Report Status PENDING   Incomplete  CULTURE, ROUTINE-ABSCESS     Status: None   Collection Time    02/14/13 11:20 AM      Result Value Range Status   Specimen Description ABSCESS LEFT THIGH   Final   Special Requests NONE   Final   Gram Stain     Final   Value: RF WBC PRESENT, PREDOMINANTLY PMN     NO SQUAMOUS EPITHELIAL CELLS SEEN     ABUNDANT GRAM NEGATIVE RODS     ABUNDANT GRAM POSITIVE RODS     FEW GRAM POSITIVE COCCI IN PAIRS   Culture     Final   Value: ABUNDANT MICROAEROPHILIC STREPTOCOCCI     Note: Standardized susceptibility testing for this organism is not available.     Performed at [facility]   Report Status 02/17/2013 FINAL   Final    Studies/Results: Dg Femur Left  02/14/2013   CLINICAL DATA:  Fall with pain and swelling.  EXAM: LEFT FEMUR - 2 VIEW  COMPARISON:  Abdomen pelvis CT 02/09/2013  FINDINGS: There is an 8 cm area of subcutaneous gas in the anterior and proximal left thigh. There is no radiodense foreign body. No acute osseous findings, including fracture or osseous infection. Diffuse arterial calcification in this patient with history of diabetes.  These results were called by telephone at the time of interpretation on 02/14/2013 at 2:56 AM to Dr. Delora Fuel , who verbally acknowledged these results.  IMPRESSION: 8 cm area of subcutaneous gas in the anterior and upper thigh. If there is no skin breech or recent procedure,  findings concerning for necrotizing infection.   Electronically Signed   By: Jorje Guild M.D.   On: 02/14/2013 02:56   Ct Hip Left Wo Contrast  02/09/2013  CLINICAL DATA:  Pt with fall and left upper leg and hip pain evaluate for fracture  EXAM: CT OF THE LEFT HIP WITHOUT CONTRAST  TECHNIQUE: Multidetector CT imaging was performed according to the standard protocol. Multiplanar CT image reconstructions were also generated.  COMPARISON:  None.  FINDINGS: There is no left hip fracture or dislocation. The left superior and inferior pubic rami are intact. There is soft tissue edema within the subcutaneous fat overlying the left proximal femur with skin thickening which may be secondary to direct trauma versus cellulitis.  There is no focal fluid collection. There is no hematoma. There is no soft tissue emphysema.  There is no lytic or blastic osseous lesion.  There is peripheral vascular atherosclerotic disease.  IMPRESSION: 1. No acute osseous injury of the left hip. 2. Soft tissue edema within the subcutaneous fat overlying the left proximal femur with skin thickening which may be secondary to direct trauma versus cellulitis.   Electronically Signed   By: Kathreen Devoid   On: 02/09/2013 12:44   Ct Femur Left Wo Contrast  02/14/2013   CLINICAL DATA:  Gas in upper thigh soft tissues.  EXAM: CT OF THE LEFT FEMUR WITHOUT CONTRAST  TECHNIQUE: Axial noncontrast 2.5 mm CT images through the left hip with coronal and sagittal reformations.  COMPARISON:  Left femur radiograph February 14, 2013.  FINDINGS: As seen on prior radiograph, there is subcutaneous emphysema along the anterolateral femoral soft tissues without subfascial extent. Associated skin thickening, and interstitial edema with thickened appearance of the fascia. Preservation the interim muscular fascia planes. No focal fluid collection. No radiopaque foreign bodies.  Dense vascular calcifications. No fracture or dislocation. No destructive bony lesions.   IMPRESSION: Left anterolateral femur subcutaneous emphysema with skin thickening and thickened fascia concerning for superficial necrotizing fasciitis, without subfascial extent. No abscess.  No acute fracture or dislocation.   Electronically Signed   By: Elon Alas   On: 02/14/2013 05:00    Medications: Scheduled Meds: . aspirin EC  81 mg Oral Daily  . azaTHIOprine  50 mg Oral BID  . cinacalcet  30 mg Oral Daily  . docusate sodium  100 mg Oral BID  . feeding supplement (GLUCERNA SHAKE)  237 mL Oral BID BM  . insulin aspart  0-9 Units Subcutaneous TID WC  . insulin aspart  4 Units Subcutaneous TID WC  . insulin glargine  40 Units Subcutaneous QHS  . isosorbide mononitrate  60 mg Oral Daily  . levothyroxine  50 mcg Oral QAC breakfast  . magnesium oxide  200 mg Oral Daily  . metoprolol  25 mg Oral BID  . pantoprazole  40 mg Oral Daily  . piperacillin-tazobactam (ZOSYN)  IV  3.375 g Intravenous Q8H  . polyethylene glycol  17 g Oral Daily  . prednisoLONE acetate  1 drop Left Eye TID  . predniSONE  10 mg Oral Daily  . simvastatin  5 mg Oral q1800  . tacrolimus  9 mg Oral BID  . timolol  1 drop Right Eye BID  . vancomycin  750 mg Intravenous BID      LOS: 6 days   RAI,RIPUDEEP M.D. [facility] 02/20/2013, 12:27 PM Pager: [phone]  If 7PM-7AM, please contact night-coverage [email or web address] Password [email or web address]

## 2013-02-20 NOTE — Progress Notes (Signed)
Pt to transfer to 6N per MD. Report called to [facility]. Pt to transfer via bed.

## 2013-02-21 DIAGNOSIS — L03319 Cellulitis of trunk, unspecified: Secondary | ICD-10-CM

## 2013-02-21 DIAGNOSIS — L02219 Cutaneous abscess of trunk, unspecified: Secondary | ICD-10-CM

## 2013-02-21 LAB — GLUCOSE, CAPILLARY
Glucose-Capillary: 113 mg/dL — ABNORMAL HIGH (ref 70–99)
Glucose-Capillary: 128 mg/dL — ABNORMAL HIGH (ref 70–99)
Glucose-Capillary: 125 mg/dL — ABNORMAL HIGH (ref 70–99)
Glucose-Capillary: 187 mg/dL — ABNORMAL HIGH (ref 70–99)

## 2013-02-21 MED ORDER — SODIUM CHLORIDE 0.9 % IV SOLN
3.0000 g | Freq: Four times a day (QID) | INTRAVENOUS | Status: DC
  Administered 2013-02-21 – 2013-02-23 (×8): 3 g via INTRAVENOUS
  Filled 2013-02-21 (×11): qty 3

## 2013-02-21 NOTE — Progress Notes (Signed)
ANTIBIOTIC CONSULT NOTE - INITIAL  Pharmacy Consult for unasyn Indication: thigh abscess  Allergies  Allergen Reactions  . Cephalexin Swelling    Swelling to face, chills Tolerates zosyn 02/14/13  . Propoxyphene N-Acetaminophen Hives and Itching    Patient Measurements: Height: 5\' 5"  (165.1 cm) Weight: 194 lb 11.2 oz (88.315 kg) IBW/kg (Calculated) : 57   Vital Signs: Temp: 98.1 F (36.7 C) (02/08 1408) Temp src: Oral (02/08 1408) BP: 132/44 mmHg (02/08 1408) Pulse Rate: 77 (02/08 1408) Intake/Output from previous day: 02/07 0701 - 02/08 0700 In: 1160 [P.O.:480; IV Piggyback:200] Out: -  Intake/Output from this shift: Total I/O In: 4860 [P.O.:360; I.V.:4050; IV Piggyback:450] Out: -   Labs:  Recent Labs  02/18/13 1840 02/19/13 0520  WBC 9.5  --   HGB 11.0*  --   PLT 211  --   CREATININE  --  1.13*   Estimated Creatinine Clearance: 56.6 ml/min (by C-G formula based on Cr of 1.13). No results found for this basename: VANCOTROUGH, Corlis Leak, VANCORANDOM, [place], GENTPEAK, [place], [place], TOBRAPEAK, TOBRARND, AMIKACINPEAK, AMIKACINTROU, AMIKACIN,  in the last 72 hours   Microbiology: Recent Results (from the past 720 hour(s))  TECHNOLOGIST REVIEW     Status: None   Collection Time    02/02/13  8:13 AM      Result Value Range Status   Technologist Review Oc myelocyte, Oc NRBC   Final  TECHNOLOGIST REVIEW     Status: None   Collection Time    02/09/13 10:13 AM      Result Value Range Status   Technologist Review 2% metas, occ NRBC present   Final  MRSA PCR SCREENING     Status: None   Collection Time    02/14/13  7:51 AM      Result Value Range Status   MRSA by PCR NEGATIVE  NEGATIVE Final   Comment:            The GeneXpert MRSA Assay (FDA     approved for NASAL specimens     only), is one component of a     comprehensive MRSA colonization     surveillance program. It is not     intended to diagnose MRSA     infection nor to guide or   monitor treatment for     MRSA infections.  ANAEROBIC CULTURE     Status: None   Collection Time    02/14/13 11:20 AM      Result Value Range Status   Specimen Description ABSCESS LEFT THIGH   Final   Special Requests NONE   Final   Gram Stain     Final   Value: NO WBC SEEN     NO SQUAMOUS EPITHELIAL CELLS SEEN     MODERATE GRAM NEGATIVE RODS     MODERATE GRAM POSITIVE RODS     RARE GRAM POSITIVE COCCI IN PAIRS   Culture     Final   Value: NO ANAEROBES ISOLATED     Performed at [facility]   Report Status 02/20/2013 FINAL   Final  CULTURE, ROUTINE-ABSCESS     Status: None   Collection Time    02/14/13 11:20 AM      Result Value Range Status   Specimen Description ABSCESS LEFT THIGH   Final   Special Requests NONE   Final   Gram Stain     Final   Value: RF WBC PRESENT, PREDOMINANTLY PMN     NO SQUAMOUS EPITHELIAL CELLS  SEEN     ABUNDANT GRAM NEGATIVE RODS     ABUNDANT GRAM POSITIVE RODS     FEW GRAM POSITIVE COCCI IN PAIRS   Culture     Final   Value: ABUNDANT MICROAEROPHILIC STREPTOCOCCI     Note: Standardized susceptibility testing for this organism is not available.     Performed at [facility]   Report Status 02/17/2013 FINAL   Final    Medical History: Past Medical History  Diagnosis Date  . Diabetes mellitus   . Acid reflux   . S/P kidney transplant   . GERD (gastroesophageal reflux disease)   . Hyperlipidemia   . DJD (degenerative joint disease)   . TIA (transient ischemic attack)   . Hx of colonic polyps   . PUD (peptic ulcer disease)   . Diabetic retinopathy   . Hemorrhoids   . Generalized headaches   . Hypertension   . Asthma   . MI (myocardial infarction) 2011  . Kidney Morell   . Mental disorder   . Stroke 2008    no deficits  . Anemia     Hx-not current  . Breast cancer   . Blind left eye   . Pneumonia   . Chronic diastolic CHF (congestive heart failure)     a. Cardiac MRI (10/2012): Moderate to severe LVH, EF 55%, chordal  SAM but no LVOT gradient, mod circumferential effusion, mild LAE.  b.  Echocardiogram (11/06/12): Severe LVH, EF 56-81%, grade 1 diastolic dysfunction, moderate effusion.  Marland Kitchen Hx of cardiovascular stress test     a. Lexiscan Myoview (11/05/12): High-risk scan; fixed defect affecting the entire inferolateral and anterolateral wall; mid/apical anterior and mid/apical inferior moderate ischemia, EF 28%.  (felt to be false + as echo and cMRI with normal EF and normal WM)  . Hx of cardiac cath     a. reportedly no CAD on cardiac cath in [place]  . Wears glasses    Assessment: Patient is a 63 y.o F on vancomycin and zosyn day #7 for left thigh abscess.  1/2 thigh abscess culture now back with "abundant microaerophilic streptococci."  To deescalate abx to unasyn.  Plan:  1) unasyn 3gm IV q6h  Kamill Fulbright P 02/21/2013,3:29 PM

## 2013-02-21 NOTE — Progress Notes (Signed)
Patient ID: Monica Lee  female  [id number]    DOB: Oct 09, 1950    DOA: 02/14/2013  PCP: [name], MD  Assessment/Plan: Principal Problem:   Cellulitis and abscess of leg, traumatic hematoma of thigh  - continue Vanc and zosyn, culture data shows MICROAEROPHILIC STREPTOCOCCI - Continue Dilaudid (PRN) and Hydrocodone (routine) for pain control  - s/p I& D Of the left thigh abscess on 2/5 for necrotizing soft tissue infection. She was transferred to step down for closer monitoring. Currently receiving wound care and possible wound vac by surgery service  Active Problems:   Type 2 diabetes mellitus - controlled - Continue Lantus, NovoLog, sliding scale insulin    CAD, NATIVE VESSEL - No chest pain, continue monitoring, stable    Chronic diastolic heart failure -Currently compensated and stable  Hypothyroidism - TSH ~35, FT4 0.38, FT3 1.6  -Continue Synthroid, will need outpatient follow up  s/p KIDNEY TRANSPLANTATION  -Continue immunosurpressives, tacrolimus, prednisone, azathioprine  -Creatinine stable, 1.13    Breast cancer of lower-outer quadrant of right female breast - Dr Karleen Hampshire d/w Dr. Humphrey Rolls regarding chemo (as patient is due for chemo Thursday), this will be held for now    Essential hypertension, benign: Stable  DVT Prophylaxis:  Code Status:  Family Communication:  Disposition: TBD    Subjective:  Any specific complaints, wound care by surgery, improving  Objective: Weight change:  No intake or output data in the 24 hours ending 02/21/13 1504 Blood pressure 132/44, pulse 77, temperature 98.1 F (36.7 C), temperature source Oral, resp. rate 18, height 5\' 5"  (1.651 m), weight 88.315 kg (194 lb 11.2 oz), SpO2 99.00%.  Physical Exam: General: Alert and awake, oriented, NAD CVS: S1-S2 clear, no murmur rubs or gallops Chest: clear to auscultation bilaterally, no wheezing, rales or rhonchi Abdomen: soft nontender, nondistended, normal bowel sounds   Extremities: no cyanosis, clubbing or edema noted bilaterally, left  thigh wound inspected   Lab Results: Basic Metabolic Panel:  Recent Labs Lab 02/15/13 0317 02/17/13 0135 02/19/13 0520  NA 136* 134* 142  K 3.7 4.1 3.9  CL 95* 94* 102  CO2 29 27 29   GLUCOSE 190* 275* 73  BUN 17 28* 16  CREATININE 1.24* 1.83* 1.13*  CALCIUM 8.5 8.4 8.2*  MG 2.2  --   --   PHOS 2.7  --   --    Liver Function Tests:  Recent Labs Lab 02/15/13 0317  AST 26  ALT 16  ALKPHOS 69  BILITOT 0.4  PROT 6.0  ALBUMIN 2.2*   No results found for this basename: LIPASE, AMYLASE,  in the last 168 hours No results found for this basename: AMMONIA,  in the last 168 hours CBC:  Recent Labs Lab 02/17/13 0135 02/18/13 1840  WBC 11.1* 9.5  HGB 10.9* 11.0*  HCT 33.5* 34.5*  MCV 91.8 93.2  PLT 191 211   Cardiac Enzymes: No results found for this basename: CKTOTAL, CKMB, CKMBINDEX, TROPONINI,  in the last 168 hours BNP: No components found with this basename: POCBNP,  CBG:  Recent Labs Lab 02/20/13 1137 02/20/13 1659 02/20/13 2203 02/21/13 0807 02/21/13 1207  GLUCAP 101* 162* 189* 113* 128*     Micro Results: Recent Results (from the past 240 hour(s))  MRSA PCR SCREENING     Status: None   Collection Time    02/14/13  7:51 AM      Result Value Range Status   MRSA by PCR NEGATIVE  NEGATIVE Final   Comment:  The GeneXpert MRSA Assay (FDA     approved for NASAL specimens     only), is one component of a     comprehensive MRSA colonization     surveillance program. It is not     intended to diagnose MRSA     infection nor to guide or     monitor treatment for     MRSA infections.  ANAEROBIC CULTURE     Status: None   Collection Time    02/14/13 11:20 AM      Result Value Range Status   Specimen Description ABSCESS LEFT THIGH   Final   Special Requests NONE   Final   Gram Stain     Final   Value: NO WBC SEEN     NO SQUAMOUS EPITHELIAL CELLS SEEN     MODERATE  GRAM NEGATIVE RODS     MODERATE GRAM POSITIVE RODS     RARE GRAM POSITIVE COCCI IN PAIRS   Culture     Final   Value: NO ANAEROBES ISOLATED     Performed at [facility]   Report Status 02/20/2013 FINAL   Final  CULTURE, ROUTINE-ABSCESS     Status: None   Collection Time    02/14/13 11:20 AM      Result Value Range Status   Specimen Description ABSCESS LEFT THIGH   Final   Special Requests NONE   Final   Gram Stain     Final   Value: RF WBC PRESENT, PREDOMINANTLY PMN     NO SQUAMOUS EPITHELIAL CELLS SEEN     ABUNDANT GRAM NEGATIVE RODS     ABUNDANT GRAM POSITIVE RODS     FEW GRAM POSITIVE COCCI IN PAIRS   Culture     Final   Value: ABUNDANT MICROAEROPHILIC STREPTOCOCCI     Note: Standardized susceptibility testing for this organism is not available.     Performed at [facility]   Report Status 02/17/2013 FINAL   Final    Studies/Results: Dg Femur Left  02/14/2013   CLINICAL DATA:  Fall with pain and swelling.  EXAM: LEFT FEMUR - 2 VIEW  COMPARISON:  Abdomen pelvis CT 02/09/2013  FINDINGS: There is an 8 cm area of subcutaneous gas in the anterior and proximal left thigh. There is no radiodense foreign body. No acute osseous findings, including fracture or osseous infection. Diffuse arterial calcification in this patient with history of diabetes.  These results were called by telephone at the time of interpretation on 02/14/2013 at 2:56 AM to Dr. Delora Fuel , who verbally acknowledged these results.  IMPRESSION: 8 cm area of subcutaneous gas in the anterior and upper thigh. If there is no skin breech or recent procedure, findings concerning for necrotizing infection.   Electronically Signed   By: Jorje Guild M.D.   On: 02/14/2013 02:56   Ct Hip Left Wo Contrast  02/09/2013   CLINICAL DATA:  Pt with fall and left upper leg and hip pain evaluate for fracture  EXAM: CT OF THE LEFT HIP WITHOUT CONTRAST  TECHNIQUE: Multidetector CT imaging was performed according to the  standard protocol. Multiplanar CT image reconstructions were also generated.  COMPARISON:  None.  FINDINGS: There is no left hip fracture or dislocation. The left superior and inferior pubic rami are intact. There is soft tissue edema within the subcutaneous fat overlying the left proximal femur with skin thickening which may be secondary to direct trauma versus cellulitis.  There is no focal fluid  collection. There is no hematoma. There is no soft tissue emphysema.  There is no lytic or blastic osseous lesion.  There is peripheral vascular atherosclerotic disease.  IMPRESSION: 1. No acute osseous injury of the left hip. 2. Soft tissue edema within the subcutaneous fat overlying the left proximal femur with skin thickening which may be secondary to direct trauma versus cellulitis.   Electronically Signed   By: Kathreen Devoid   On: 02/09/2013 12:44   Ct Femur Left Wo Contrast  02/14/2013   CLINICAL DATA:  Gas in upper thigh soft tissues.  EXAM: CT OF THE LEFT FEMUR WITHOUT CONTRAST  TECHNIQUE: Axial noncontrast 2.5 mm CT images through the left hip with coronal and sagittal reformations.  COMPARISON:  Left femur radiograph February 14, 2013.  FINDINGS: As seen on prior radiograph, there is subcutaneous emphysema along the anterolateral femoral soft tissues without subfascial extent. Associated skin thickening, and interstitial edema with thickened appearance of the fascia. Preservation the interim muscular fascia planes. No focal fluid collection. No radiopaque foreign bodies.  Dense vascular calcifications. No fracture or dislocation. No destructive bony lesions.  IMPRESSION: Left anterolateral femur subcutaneous emphysema with skin thickening and thickened fascia concerning for superficial necrotizing fasciitis, without subfascial extent. No abscess.  No acute fracture or dislocation.   Electronically Signed   By: Elon Alas   On: 02/14/2013 05:00    Medications: Scheduled Meds: . aspirin EC  81 mg Oral  Daily  . azaTHIOprine  50 mg Oral BID  . cinacalcet  30 mg Oral Daily  . docusate sodium  100 mg Oral BID  . feeding supplement (GLUCERNA SHAKE)  237 mL Oral BID BM  . insulin aspart  0-9 Units Subcutaneous TID WC  . insulin aspart  4 Units Subcutaneous TID WC  . insulin glargine  40 Units Subcutaneous QHS  . isosorbide mononitrate  60 mg Oral Daily  . levothyroxine  50 mcg Oral QAC breakfast  . magnesium oxide  200 mg Oral Daily  . metoprolol  25 mg Oral BID  . pantoprazole  40 mg Oral Daily  . piperacillin-tazobactam (ZOSYN)  IV  3.375 g Intravenous Q8H  . polyethylene glycol  17 g Oral Daily  . prednisoLONE acetate  1 drop Left Eye TID  . predniSONE  10 mg Oral Daily  . simvastatin  5 mg Oral q1800  . tacrolimus  9 mg Oral BID  . timolol  1 drop Right Eye BID  . vancomycin  750 mg Intravenous BID      LOS: 7 days   RAI,RIPUDEEP M.D. [facility] 02/21/2013, 3:04 PM Pager: [phone]  If 7PM-7AM, please contact night-coverage [email or web address] Password [email or web address]

## 2013-02-21 NOTE — Progress Notes (Signed)
3 Days Post-Op  Subjective: Minimal pain No complaints  Objective: Vital signs in last 24 hours: Temp:  [98.1 F (36.7 C)-98.7 F (37.1 C)] 98.5 F (36.9 C) (02/08 0634) Pulse Rate:  [74-82] 82 (02/08 0634) Resp:  [18-20] 20 (02/08 0634) BP: (125-152)/(53-80) 152/68 mmHg (02/08 0634) SpO2:  [96 %-100 %] 100 % (02/08 0634) Weight:  [194 lb 11.2 oz (88.315 kg)] 194 lb 11.2 oz (88.315 kg) (02/07 1615) Last BM Date: 02/21/13  Intake/Output from previous day: 02/07 0701 - 02/08 0700 In: 1160 [P.O.:480; IV Piggyback:200] Out: -  Intake/Output this shift:    Wound is very clean with exposed muscle, minimal drainage  Lab Results:   Recent Labs  02/18/13 1840  WBC 9.5  HGB 11.0*  HCT 34.5*  PLT 211   BMET  Recent Labs  02/19/13 0520  NA 142  K 3.9  CL 102  CO2 29  GLUCOSE 73  BUN 16  CREATININE 1.13*  CALCIUM 8.2*   PT/INR No results found for this basename: LABPROT, INR,  in the last 72 hours ABG No results found for this basename: PHART, PCO2, PO2, HCO3,  in the last 72 hours  Studies/Results: No results found.  Anti-infectives: Anti-infectives   Start     Dose/Rate Route Frequency Ordered Stop   02/19/13 1130  vancomycin (VANCOCIN) IVPB 750 mg/150 ml premix     750 mg 150 mL/hr over 60 Minutes Intravenous 2 times daily 02/19/13 1020     02/17/13 2000  vancomycin (VANCOCIN) 1,250 mg in sodium chloride 0.9 % 250 mL IVPB  Status:  Discontinued     1,250 mg 166.7 mL/hr over 90 Minutes Intravenous Every 24 hours 02/17/13 0237 02/19/13 1020   02/15/13 0200  vancomycin (VANCOCIN) IVPB 1000 mg/200 mL premix  Status:  Discontinued     1,000 mg 200 mL/hr over 60 Minutes Intravenous Every 24 hours 02/14/13 0814 02/17/13 0237   02/14/13 1400  piperacillin-tazobactam (ZOSYN) IVPB 3.375 g     3.375 g 12.5 mL/hr over 240 Minutes Intravenous 3 times per day 02/14/13 0754     02/14/13 0515  piperacillin-tazobactam (ZOSYN) IVPB 3.375 g     3.375 g 100 mL/hr over 30  Minutes Intravenous  Once 02/14/13 0508 02/14/13 0638   02/14/13 0300  vancomycin (VANCOCIN) IVPB 1000 mg/200 mL premix     1,000 mg 200 mL/hr over 60 Minutes Intravenous  Once 02/14/13 0248 02/14/13 0443      Assessment/Plan: s/p Procedure(s): IRRIGATION AND DEBRIDEMENT THIGH WOUND (Left) Dressing changes VAC tomorrow Will probably benefit from home VAC due to size of wound   LOS: 7 days    Monica Lee K. 02/21/2013

## 2013-02-22 ENCOUNTER — Encounter (HOSPITAL_COMMUNITY): Payer: Self-pay | Admitting: General Surgery

## 2013-02-22 LAB — TYPE AND SCREEN
ABO/RH(D): A POS
Antibody Screen: NEGATIVE
UNIT DIVISION: 0
Unit division: 0

## 2013-02-22 LAB — CREATININE, SERUM
Creatinine, Ser: 0.99 mg/dL (ref 0.50–1.10)
GFR calc non Af Amer: 60 mL/min — ABNORMAL LOW (ref >90)
GFR, EST AFRICAN AMERICAN: 69 mL/min — AB (ref >90)

## 2013-02-22 LAB — GLUCOSE, CAPILLARY
GLUCOSE-CAPILLARY: 121 mg/dL — AB (ref 70–99)
GLUCOSE-CAPILLARY: 114 mg/dL — AB (ref 70–99)
Glucose-Capillary: 138 mg/dL — ABNORMAL HIGH (ref 70–99)
Glucose-Capillary: 186 mg/dL — ABNORMAL HIGH (ref 70–99)

## 2013-02-22 NOTE — Progress Notes (Signed)
Patient ID: Monica Lee  female  ION:[id number]    DOB: [birth year]    DOA: 02/14/2013  PCP: [name], MD Brief HPI: 63 y/o female with DM, GERD, s/p renal transplant on immunosurpressives, asthma, breast cancer currently being treated, blindness in left eye, chronic diastolic CHF admitted for an abscess in her left thigh after a trauma 4 wks ago. - s/p I& D Of the left thigh abscess on 2/5 for necrotizing soft tissue infection. She was transferred to step down for closer monitoring. She remains stable and pain controlled. She was transferred to the floor on 2/7 and a wound vac has been put on 2/9. Possible d/c in am.   Assessment/Plan: Principal Problem:   Cellulitis and abscess of leg, traumatic hematoma of thigh  - continue Vanc and zosyn, culture data shows MICROAEROPHILIC STREPTOCOCCI - Continue Dilaudid (PRN) and Hydrocodone (routine) for pain control  - s/p I& D Of the left thigh abscess on 2/5 for necrotizing soft tissue infection. She was transferred to step down for closer monitoring. Currently receiving wound care and  Wound vac placed on 2/9.   Active Problems:   Type 2 diabetes mellitus - controlled - Continue Lantus, NovoLog, sliding scale insulin - CBG (last 3)   Recent Labs  02/22/13 0749 02/22/13 1218 02/22/13 1720  GLUCAP 121* 114* 138*        CAD, NATIVE VESSEL - No chest pain, continue monitoring, stable    Chronic diastolic heart failure -Currently compensated and stable  Hypothyroidism - TSH ~35, FT4 0.38, FT3 1.6  -Continue Synthroid, will need outpatient follow up  s/p KIDNEY TRANSPLANTATION  -Continue immunosurpressives, tacrolimus, prednisone, azathioprine  -Creatinine stable, 1.13    Breast cancer of lower-outer quadrant of right female breast - Dr. Humphrey Rolls regarding chemo (as patient is due for chemo Thursday), this will be held for now    Essential hypertension, benign: Stable  DVT Prophylaxis:  Code Status: full code  Family  Communication: none at bedside  Disposition: possible d/c in am.     Subjective:  Any specific complaints, wound care by surgery, improving  Objective: Weight change:   Intake/Output Summary (Last 24 hours) at 02/22/13 1815 Last data filed at 02/22/13 0947  Gross per 24 hour  Intake    360 ml  Output      0 ml  Net    360 ml   Blood pressure 144/66, pulse 74, temperature 97.6 F (36.4 C), temperature source Oral, resp. rate 16, height 5\' 5"  (1.651 m), weight 88.315 kg (194 lb 11.2 oz), SpO2 96.00%.  Physical Exam: General: Alert and awake, oriented, NAD CVS: S1-S2 clear, no murmur rubs or gallops Chest: clear to auscultation bilaterally, no wheezing, rales or rhonchi Abdomen: soft nontender, nondistended, normal bowel sounds  Extremities: no cyanosis, clubbing or edema noted bilaterally, left  thigh wound inspected   Lab Results: Basic Metabolic Panel:  Recent Labs Lab 02/17/13 0135 02/19/13 0520 02/22/13 1017  NA 134* 142  --   K 4.1 3.9  --   CL 94* 102  --   CO2 27 29  --   GLUCOSE 275* 73  --   BUN 28* 16  --   CREATININE 1.83* 1.13* 0.99  CALCIUM 8.4 8.2*  --    Liver Function Tests: No results found for this basename: AST, ALT, ALKPHOS, BILITOT, PROT, ALBUMIN,  in the last 168 hours No results found for this basename: LIPASE, AMYLASE,  in the last 168 hours No results found for  this basename: AMMONIA,  in the last 168 hours CBC:  Recent Labs Lab 02/17/13 0135 02/18/13 1840  WBC 11.1* 9.5  HGB 10.9* 11.0*  HCT 33.5* 34.5*  MCV 91.8 93.2  PLT 191 211   Cardiac Enzymes: No results found for this basename: CKTOTAL, CKMB, CKMBINDEX, TROPONINI,  in the last 168 hours BNP: No components found with this basename: POCBNP,  CBG:  Recent Labs Lab 02/21/13 1710 02/21/13 2212 02/22/13 0749 02/22/13 1218 02/22/13 1720  GLUCAP 125* 187* 121* 114* 138*     Micro Results: Recent Results (from the past 240 hour(s))  MRSA PCR SCREENING     Status:  None   Collection Time    02/14/13  7:51 AM      Result Value Range Status   MRSA by PCR NEGATIVE  NEGATIVE Final   Comment:            The GeneXpert MRSA Assay (FDA     approved for NASAL specimens     only), is one component of a     comprehensive MRSA colonization     surveillance program. It is not     intended to diagnose MRSA     infection nor to guide or     monitor treatment for     MRSA infections.  ANAEROBIC CULTURE     Status: None   Collection Time    02/14/13 11:20 AM      Result Value Range Status   Specimen Description ABSCESS LEFT THIGH   Final   Special Requests NONE   Final   Gram Stain     Final   Value: NO WBC SEEN     NO SQUAMOUS EPITHELIAL CELLS SEEN     MODERATE GRAM NEGATIVE RODS     MODERATE GRAM POSITIVE RODS     RARE GRAM POSITIVE COCCI IN PAIRS   Culture     Final   Value: NO ANAEROBES ISOLATED     Performed at [facility]   Report Status 02/20/2013 FINAL   Final  CULTURE, ROUTINE-ABSCESS     Status: None   Collection Time    02/14/13 11:20 AM      Result Value Range Status   Specimen Description ABSCESS LEFT THIGH   Final   Special Requests NONE   Final   Gram Stain     Final   Value: RF WBC PRESENT, PREDOMINANTLY PMN     NO SQUAMOUS EPITHELIAL CELLS SEEN     ABUNDANT GRAM NEGATIVE RODS     ABUNDANT GRAM POSITIVE RODS     FEW GRAM POSITIVE COCCI IN PAIRS   Culture     Final   Value: ABUNDANT MICROAEROPHILIC STREPTOCOCCI     Note: Standardized susceptibility testing for this organism is not available.     Performed at [facility]   Report Status 02/17/2013 FINAL   Final    Studies/Results: Dg Femur Left  02/14/2013   CLINICAL DATA:  Fall with pain and swelling.  EXAM: LEFT FEMUR - 2 VIEW  COMPARISON:  Abdomen pelvis CT 02/09/2013  FINDINGS: There is an 8 cm area of subcutaneous gas in the anterior and proximal left thigh. There is no radiodense foreign body. No acute osseous findings, including fracture or osseous  infection. Diffuse arterial calcification in this patient with history of diabetes.  These results were called by telephone at the time of interpretation on 02/14/2013 at 2:56 AM to Dr. Delora Fuel , who verbally acknowledged these  results.  IMPRESSION: 8 cm area of subcutaneous gas in the anterior and upper thigh. If there is no skin breech or recent procedure, findings concerning for necrotizing infection.   Electronically Signed   By: Jorje Guild M.D.   On: 02/14/2013 02:56   Ct Hip Left Wo Contrast  02/09/2013   CLINICAL DATA:  Pt with fall and left upper leg and hip pain evaluate for fracture  EXAM: CT OF THE LEFT HIP WITHOUT CONTRAST  TECHNIQUE: Multidetector CT imaging was performed according to the standard protocol. Multiplanar CT image reconstructions were also generated.  COMPARISON:  None.  FINDINGS: There is no left hip fracture or dislocation. The left superior and inferior pubic rami are intact. There is soft tissue edema within the subcutaneous fat overlying the left proximal femur with skin thickening which may be secondary to direct trauma versus cellulitis.  There is no focal fluid collection. There is no hematoma. There is no soft tissue emphysema.  There is no lytic or blastic osseous lesion.  There is peripheral vascular atherosclerotic disease.  IMPRESSION: 1. No acute osseous injury of the left hip. 2. Soft tissue edema within the subcutaneous fat overlying the left proximal femur with skin thickening which may be secondary to direct trauma versus cellulitis.   Electronically Signed   By: Kathreen Devoid   On: 02/09/2013 12:44   Ct Femur Left Wo Contrast  02/14/2013   CLINICAL DATA:  Gas in upper thigh soft tissues.  EXAM: CT OF THE LEFT FEMUR WITHOUT CONTRAST  TECHNIQUE: Axial noncontrast 2.5 mm CT images through the left hip with coronal and sagittal reformations.  COMPARISON:  Left femur radiograph February 14, 2013.  FINDINGS: As seen on prior radiograph, there is subcutaneous  emphysema along the anterolateral femoral soft tissues without subfascial extent. Associated skin thickening, and interstitial edema with thickened appearance of the fascia. Preservation the interim muscular fascia planes. No focal fluid collection. No radiopaque foreign bodies.  Dense vascular calcifications. No fracture or dislocation. No destructive bony lesions.  IMPRESSION: Left anterolateral femur subcutaneous emphysema with skin thickening and thickened fascia concerning for superficial necrotizing fasciitis, without subfascial extent. No abscess.  No acute fracture or dislocation.   Electronically Signed   By: Elon Alas   On: 02/14/2013 05:00    Medications: Scheduled Meds: . ampicillin-sulbactam (UNASYN) IV  3 g Intravenous Q6H  . aspirin EC  81 mg Oral Daily  . azaTHIOprine  50 mg Oral BID  . cinacalcet  30 mg Oral Daily  . docusate sodium  100 mg Oral BID  . feeding supplement (GLUCERNA SHAKE)  237 mL Oral BID BM  . insulin aspart  0-9 Units Subcutaneous TID WC  . insulin aspart  4 Units Subcutaneous TID WC  . insulin glargine  40 Units Subcutaneous QHS  . isosorbide mononitrate  60 mg Oral Daily  . levothyroxine  50 mcg Oral QAC breakfast  . magnesium oxide  200 mg Oral Daily  . metoprolol  25 mg Oral BID  . pantoprazole  40 mg Oral Daily  . polyethylene glycol  17 g Oral Daily  . prednisoLONE acetate  1 drop Left Eye TID  . predniSONE  10 mg Oral Daily  . simvastatin  5 mg Oral q1800  . tacrolimus  9 mg Oral BID  . timolol  1 drop Right Eye BID      LOS: 8 days   Peng Thorstenson M.D. [facility] 02/22/2013, 6:15 PM Pager: [phone]  If 7PM-7AM, please contact  night-coverage [email or web address] Password [email or web address]

## 2013-02-22 NOTE — Progress Notes (Signed)
4 Days Post-Op  Subjective: Pt up in room  Objective: Vital signs in last 24 hours: Temp:  [97.9 F (36.6 C)-98.6 F (37 C)] 98.6 F (37 C) (02/09 0552) Pulse Rate:  [76-93] 93 (02/09 0552) Resp:  [17-18] 17 (02/09 0552) BP: (132-145)/(44-67) 145/63 mmHg (02/09 0552) SpO2:  [95 %-99 %] 96 % (02/09 0552) Last BM Date: 02/21/13  Intake/Output from previous day: 02/08 0701 - 02/09 0700 In: 4860 [P.O.:360; I.V.:4050; IV Piggyback:450] Out: -  Intake/Output this shift:   PE Left thigh wound-wound is clean, minimal drainage, no eschar, muscle is exposed.   Anti-infectives: Anti-infectives   Start     Dose/Rate Route Frequency Ordered Stop   02/21/13 2100  Ampicillin-Sulbactam (UNASYN) 3 g in sodium chloride 0.9 % 100 mL IVPB     3 g 100 mL/hr over 60 Minutes Intravenous Every 6 hours 02/21/13 1538     02/19/13 1130  vancomycin (VANCOCIN) IVPB 750 mg/150 ml premix  Status:  Discontinued     750 mg 150 mL/hr over 60 Minutes Intravenous 2 times daily 02/19/13 1020 02/21/13 1527   02/17/13 2000  vancomycin (VANCOCIN) 1,250 mg in sodium chloride 0.9 % 250 mL IVPB  Status:  Discontinued     1,250 mg 166.7 mL/hr over 90 Minutes Intravenous Every 24 hours 02/17/13 0237 02/19/13 1020   02/15/13 0200  vancomycin (VANCOCIN) IVPB 1000 mg/200 mL premix  Status:  Discontinued     1,000 mg 200 mL/hr over 60 Minutes Intravenous Every 24 hours 02/14/13 0814 02/17/13 0237   02/14/13 1400  piperacillin-tazobactam (ZOSYN) IVPB 3.375 g  Status:  Discontinued     3.375 g 12.5 mL/hr over 240 Minutes Intravenous 3 times per day 02/14/13 0754 02/21/13 1520   02/14/13 0515  piperacillin-tazobactam (ZOSYN) IVPB 3.375 g     3.375 g 100 mL/hr over 30 Minutes Intravenous  Once 02/14/13 0508 02/14/13 0638   02/14/13 0300  vancomycin (VANCOCIN) IVPB 1000 mg/200 mL premix     1,000 mg 200 mL/hr over 60 Minutes Intravenous  Once 02/14/13 0248 02/14/13 0443      Assessment/Plan: Debridement of left  thigh wound Dr. Brantley Stage 2/1 and 2/5 POD #8 #4 Wound is clean and ready for wound vac placement, WOC RN at bedside to place Consult to care management for hh, vc mobilize  Pain control, oral pain meds with vac changes   LOS: 8 days   Oneida Arenas [facility] ANP-BC Pager [phone] 02/22/2013 8:57 AM

## 2013-02-22 NOTE — Progress Notes (Signed)
Has tolerated VAC application well. I spoke to her about plans including likely outpatient plastic surgery eval. Patient examined and I agree with the assessment and plan  Georganna Skeans, MD, MPH, FACS Pager: [phone]  02/22/2013 1:26 PM

## 2013-02-22 NOTE — Consult Note (Signed)
WOC wound consult note Reason for Consult: placement of initial NPWT VAC dressing L thigh Wound type: surgical wound  Measurement: 26cm x 15cm x 2.0cm with some minimal undermining at 1-2 o'clock  Wound bed: mostly beefy red, exposed muscle, < 25% slough and skin necrosis at the wound edges Drainage (amount, consistency, odor) minimal Periwound:intact Dressing procedure/placement/frequency: Covered exposed muscle with Mepitel and Oil emulsion layer.  2pc of black granufoam cut to fit to cover the wound. Seal at 17mmHg, pt tolerated well.  Will plan for [facility] nurse to change M/W/F for one week then re-eval for bedside nurse to assume dressing changes.  WOC will follow along with you for wound (VAC) changes and wound assessment Para March RN,CWOCN [phone]

## 2013-02-22 NOTE — Progress Notes (Signed)
Physical Therapy Treatment Patient Details Name: Monica Lee MRN: [id number] DOB: 1950/02/20 Today's Date: 02/22/2013 Time: 9211-9417 PT Time Calculation (min): 27 min  PT Assessment / Plan / Recommendation  History of Present Illness Pt adm with Lt thigh abscess and underwent I&D 2/1. PMHX includes undergoing current chemotherapy for breast Ca, Lt eye blindness, kidney transplant '08   PT Comments   Progressing well.   Follow Up Recommendations  Home health PT;Supervision for mobility/OOB     Does the patient have the potential to tolerate intense rehabilitation     Barriers to Discharge        Equipment Recommendations  Rolling walker with 5" wheels;Other (comment)    Recommendations for Other Services    Frequency Min 3X/week   Progress towards PT Goals Progress towards PT goals: Progressing toward goals  Plan Current plan remains appropriate    Precautions / Restrictions Precautions Precautions: Fall Precaution Comments: pt reports fell 4 weeks PTA "my Rt leg just gave out with no warning"   Pertinent Vitals/Pain no apparent distress    Mobility  Bed Mobility Overal bed mobility: Modified Independent Transfers Transfers: Sit to/from Stand Sit to Stand: Supervision General transfer comment: pt requires incr time and effort; cues for hand placement and safety with RW Ambulation/Gait Ambulation/Gait assistance: Supervision Ambulation Distance (Feet): 250 Feet Assistive device: Rolling walker (2 wheeled) Gait Pattern/deviations: Step-through pattern;Wide base of support General Gait Details: Patient with great posture and use of RW. First time walking with new VAC on L LE    Exercises     PT Diagnosis:    PT Problem List:   PT Treatment Interventions:     PT Goals (current goals can now be found in the care plan section)    Visit Information  Last PT Received On: 02/22/13 Assistance Needed: +1 History of Present Illness: Pt adm with Lt thigh abscess and  underwent I&D 2/1. PMHX includes undergoing current chemotherapy for breast Ca, Lt eye blindness, kidney transplant '08    Subjective Data      Cognition  Cognition Arousal/Alertness: Awake/alert Behavior During Therapy: WFL for tasks assessed/performed Overall Cognitive Status: Within Functional Limits for tasks assessed    Balance     End of Session PT - End of Session Equipment Utilized During Treatment: Gait belt Activity Tolerance: Patient limited by pain;Patient limited by fatigue Patient left: in bed;with call bell/phone within reach;with family/visitor present Nurse Communication: Mobility status;Other (comment)   GP     Jacqualyn Posey 02/22/2013, 10:53 AM 02/22/2013 Jacqualyn Posey PTA [phone] pager [phone] office

## 2013-02-23 ENCOUNTER — Other Ambulatory Visit: Payer: Medicare Other

## 2013-02-23 ENCOUNTER — Ambulatory Visit: Payer: Medicare Other | Admitting: Adult Health

## 2013-02-23 ENCOUNTER — Ambulatory Visit: Payer: Medicare Other

## 2013-02-23 DIAGNOSIS — I10 Essential (primary) hypertension: Secondary | ICD-10-CM

## 2013-02-23 DIAGNOSIS — I5032 Chronic diastolic (congestive) heart failure: Secondary | ICD-10-CM

## 2013-02-23 DIAGNOSIS — C50519 Malignant neoplasm of lower-outer quadrant of unspecified female breast: Secondary | ICD-10-CM

## 2013-02-23 DIAGNOSIS — L02419 Cutaneous abscess of limb, unspecified: Secondary | ICD-10-CM

## 2013-02-23 DIAGNOSIS — S7010XA Contusion of unspecified thigh, initial encounter: Secondary | ICD-10-CM

## 2013-02-23 DIAGNOSIS — N189 Chronic kidney disease, unspecified: Secondary | ICD-10-CM

## 2013-02-23 DIAGNOSIS — L03119 Cellulitis of unspecified part of limb: Secondary | ICD-10-CM

## 2013-02-23 LAB — GLUCOSE, CAPILLARY
Glucose-Capillary: 139 mg/dL — ABNORMAL HIGH (ref 70–99)
Glucose-Capillary: 207 mg/dL — ABNORMAL HIGH (ref 70–99)
Glucose-Capillary: 123 mg/dL — ABNORMAL HIGH (ref 70–99)

## 2013-02-23 MED ORDER — HYDROCODONE-ACETAMINOPHEN 5-325 MG PO TABS: 1.0000 | ORAL_TABLET | ORAL | Status: DC | PRN

## 2013-02-23 MED ORDER — HEPARIN SOD (PORK) LOCK FLUSH 100 UNIT/ML IV SOLN
500.0000 [IU] | INTRAVENOUS | Status: AC | PRN
  Administered 2013-02-23: 500 [IU]

## 2013-02-23 MED ORDER — AMOXICILLIN-POT CLAVULANATE 875-125 MG PO TABS: 1.0000 | ORAL_TABLET | Freq: Two times a day (BID) | ORAL | Status: DC

## 2013-02-23 MED ORDER — GLUCERNA SHAKE PO LIQD: 237.0000 mL | Freq: Two times a day (BID) | ORAL | Status: DC

## 2013-02-23 MED ORDER — METOPROLOL TARTRATE 25 MG PO TABS: 25.0000 mg | ORAL_TABLET | Freq: Two times a day (BID) | ORAL | Status: DC

## 2013-02-23 MED ORDER — INSULIN GLARGINE 100 UNIT/ML ~~LOC~~ SOLN: 40.0000 [IU] | Freq: Every day | SUBCUTANEOUS | Status: DC

## 2013-02-23 MED ORDER — LEVOTHYROXINE SODIUM 50 MCG PO TABS: 50.0000 ug | ORAL_TABLET | Freq: Every day | ORAL | Status: DC

## 2013-02-23 NOTE — Progress Notes (Signed)
Plastics F/U arranged. Patient examined and I agree with the assessment and plan  Georganna Skeans, MD, MPH, FACS Pager: [phone]  02/23/2013 1:23 PM

## 2013-02-23 NOTE — Progress Notes (Signed)
Agree with PTA.    Ahana Najera, PT [phone]  

## 2013-02-23 NOTE — Progress Notes (Signed)
Physical Therapy Treatment Patient Details Name: Monica Lee MRN: [id number] DOB: [birth year] Today's Date: 02/23/2013 Time: 9678-9381 PT Time Calculation (min): 13 min  PT Assessment / Plan / Recommendation  History of Present Illness Pt adm with Lt thigh abscess and underwent I&D 2/1. PMHX includes undergoing current chemotherapy for breast Ca, Lt eye blindness, kidney transplant '08   PT Comments   Acute goals met. Will signoff on acute PT. Encourage daily walking with staff  Follow Up Recommendations  Home health PT;Supervision for mobility/OOB     Does the patient have the potential to tolerate intense rehabilitation     Barriers to Discharge        Equipment Recommendations  Rolling walker with 5" wheels;Other (comment)    Recommendations for Other Services    Frequency     Progress towards PT Goals Progress towards PT goals: Goals met/education completed, patient discharged from PT  Plan      Precautions / Restrictions Precautions Precautions: Fall Precaution Comments: pt reports fell 4 weeks PTA "my Rt leg just gave out with no warning"   Pertinent Vitals/Pain no apparent distress     Mobility  Bed Mobility Overal bed mobility: Modified Independent Transfers Overall transfer level: Modified independent Ambulation/Gait Ambulation/Gait assistance: Supervision Ambulation Distance (Feet): 600 Feet Assistive device: Rolling walker (2 wheeled) Gait Pattern/deviations: WFL(Within Functional Limits) General Gait Details: cues for posture    Exercises     PT Diagnosis:    PT Problem List:   PT Treatment Interventions:     PT Goals (current goals can now be found in the care plan section)    Visit Information  Last PT Received On: 02/23/13 Assistance Needed: +1 History of Present Illness: Pt adm with Lt thigh abscess and underwent I&D 2/1. PMHX includes undergoing current chemotherapy for breast Ca, Lt eye blindness, kidney transplant '08    Subjective  Data      Cognition  Cognition Arousal/Alertness: Awake/alert Behavior During Therapy: WFL for tasks assessed/performed Overall Cognitive Status: Within Functional Limits for tasks assessed    Balance     End of Session PT - End of Session Activity Tolerance: Patient tolerated treatment well Patient left: in chair;with call bell/phone within reach;with family/visitor present   GP     Jacqualyn Posey 02/23/2013, 9:21 AM 02/23/2013 Jacqualyn Posey PTA [phone] pager [phone] office

## 2013-02-23 NOTE — Progress Notes (Signed)
UR COMPLETED  

## 2013-02-23 NOTE — Progress Notes (Addendum)
5 Days Post-Op  Subjective: No changes.  Afebrile.  Ambulating in hallways.  Little pain.  Objective: Vital signs in last 24 hours: Temp:  [97.6 F (36.4 C)-99 F (37.2 C)] 97.9 F (36.6 C) (02/10 0554) Pulse Rate:  [74-89] 76 (02/10 0554) Resp:  [16-20] 20 (02/10 0554) BP: (144-162)/(66-78) 162/78 mmHg (02/10 0554) SpO2:  [96 %-98 %] 98 % (02/10 0554) Last BM Date: 02/21/13  Intake/Output from previous day: 02/09 0701 - 02/10 0700 In: 360 [P.O.:360] Out: -  Intake/Output this shift:   PE Left anterior thigh-wound vac in place, surrounding skin is intact.    Lab Results:  No results found for this basename: WBC, HGB, HCT, PLT,  in the last 72 hours BMET  Recent Labs  02/22/13 1017  CREATININE 0.99   PT/INR No results found for this basename: LABPROT, INR,  in the last 72 hours ABG No results found for this basename: PHART, PCO2, PO2, HCO3,  in the last 72 hours  Studies/Results: No results found.  Anti-infectives: Anti-infectives   Start     Dose/Rate Route Frequency Ordered Stop   02/21/13 2100  Ampicillin-Sulbactam (UNASYN) 3 g in sodium chloride 0.9 % 100 mL IVPB     3 g 100 mL/hr over 60 Minutes Intravenous Every 6 hours 02/21/13 1538     02/19/13 1130  vancomycin (VANCOCIN) IVPB 750 mg/150 ml premix  Status:  Discontinued     750 mg 150 mL/hr over 60 Minutes Intravenous 2 times daily 02/19/13 1020 02/21/13 1527   02/17/13 2000  vancomycin (VANCOCIN) 1,250 mg in sodium chloride 0.9 % 250 mL IVPB  Status:  Discontinued     1,250 mg 166.7 mL/hr over 90 Minutes Intravenous Every 24 hours 02/17/13 0237 02/19/13 1020   02/15/13 0200  vancomycin (VANCOCIN) IVPB 1000 mg/200 mL premix  Status:  Discontinued     1,000 mg 200 mL/hr over 60 Minutes Intravenous Every 24 hours 02/14/13 0814 02/17/13 0237   02/14/13 1400  piperacillin-tazobactam (ZOSYN) IVPB 3.375 g  Status:  Discontinued     3.375 g 12.5 mL/hr over 240 Minutes Intravenous 3 times per day 02/14/13  0754 02/21/13 1520   02/14/13 0515  piperacillin-tazobactam (ZOSYN) IVPB 3.375 g     3.375 g 100 mL/hr over 30 Minutes Intravenous  Once 02/14/13 0508 02/14/13 0638   02/14/13 0300  vancomycin (VANCOCIN) IVPB 1000 mg/200 mL premix     1,000 mg 200 mL/hr over 60 Minutes Intravenous  Once 02/14/13 0248 02/14/13 0443      Assessment/Plan:  Debridement of left thigh wound Dr. Brantley Stage 2/1 and 2/5  POD #9 #5  Wound vac changes MWF. Discussed with Shawn Rayburn with plastics.  Dr. Migdalia Dk will see the patient in the wound clinic on Monday's.  HH to change dressing MWF, skip Monday's when she has an appointment with wound center Stable for discharge when vac is approved/delivered.     LOS: 9 days    Jerry Clyne ANP-BC 02/23/2013 8:10 AM

## 2013-02-23 NOTE — Discharge Summary (Signed)
Physician Discharge Summary  Monica Lee [id number] DOB: 09/13/1950 DOA: 02/14/2013  PCP: [name], MD  Admit date: 02/14/2013 Discharge date: 02/23/2013  Time spent: 30 minutes  Recommendations for Outpatient Follow-up:  1. Follow up with PCP in one week 2. Follow up with surgery as recommended 3. Follow up with plastic surgery Dr Migdalia Dk as recommended 4. Follow up with TSH IN 4 weeks with PCP.  5. Follow up with wound clinic for wound vac  Discharge Diagnoses:  Principal Problem:   Cellulitis and abscess of leg Active Problems:   Type 2 diabetes mellitus with circulatory disorder   CAD, NATIVE VESSEL   Chronic diastolic heart failure   KIDNEY TRANSPLANTATION   Breast cancer of lower-outer quadrant of right female breast   Essential hypertension, benign   CKD (chronic kidney disease)   Traumatic hematoma of thigh newly diagnosed hypothyroidism  Discharge Condition: improved.   Diet recommendation: low ssodium diet   Filed Weights   02/14/13 0800 02/14/13 0807 02/20/13 1615  Weight: 73.936 kg (163 lb) 73.936 kg (163 lb) 88.315 kg (194 lb 11.2 oz)    History of present illness:  63 y/o female with DM, GERD, s/p renal transplant on immunosurpressives, asthma, breast cancer currently being treated, blindness in left eye, chronic diastolic CHF admitted for an abscess in her left thigh after a trauma 4 wks ago. - s/p I& D Of the left thigh abscess on 2/5 for necrotizing soft tissue infection. She was transferred to step down for closer monitoring. She remains stable and pain controlled. She was transferred to the floor on 2/7 and a wound vac has been put on 2/9.    Hospital Course:  Cellulitis and abscess of leg, traumatic hematoma of thigh  - patient completed 8 days of IV Vanc and zosyn, culture data shows MICROAEROPHILIC STREPTOCOCCI , she was discharged on augmentin to complete the course.  - Continue Hydrocodone (routine) for pain control  - s/p I& D Of the left  thigh abscess on 2/5 for necrotizing soft tissue infection. Currently receiving wound care and Wound vac placed on 2/9.   Type 2 diabetes mellitus - controlled  - Continue Lantus, NovoLog, sliding scale insulin  -  CBG (last 3)   Recent Labs   02/22/13 0749  02/22/13 1218  02/22/13 1720   GLUCAP  121*  114*  138*    CAD, NATIVE VESSEL  - No chest pain, continue monitoring, stable  Chronic diastolic heart failure  -Currently compensated and stable  Hypothyroidism  - TSH ~35, FT4 0.38, FT3 1.6  -Continue Synthroid, will need outpatient follow up  s/p KIDNEY TRANSPLANTATION  -Continue immunosurpressives, tacrolimus, prednisone, azathioprine  -Creatinine stable, on discharge.  Breast cancer of lower-outer quadrant of right female breast  - Dr. Humphrey Rolls regarding chemo (as patient is due for chemo Thursday), this will be held for now  Essential hypertension, benign: Stable   Procedures:  S/p I&D of the thigh  Consultations:  surgery  Discharge Exam: Filed Vitals:   02/23/13 1457  BP: 144/68  Pulse: 86  Temp: 98.4 F (36.9 C)  Resp: 20    General: alert febrile comfortable Cardiovascular: s1s2 Respiratory: ctab  Discharge Instructions  Discharge Orders   Future Appointments Provider Department Dept Phone   03/02/2013 8:15 AM [facility] [phone]   03/02/2013 8:45 AM Minette Headland, NP [facility] [phone]   Future Orders Complete By Expires   Discharge instructions  As directed    Comments:     Follow up with PLASTIC surgery and wound clinic as recommended.  Follow up with surgery as recommended.  Follow up with PCP in one week.       Medication List    STOP taking these medications       dexamethasone 4 MG tablet  Commonly known as:  DECADRON      TAKE these medications       albuterol 0.63 MG/3ML nebulizer solution  Commonly known as:  ACCUNEB  Take 1 ampule  by nebulization every 6 (six) hours as needed for wheezing.     albuterol 108 (90 BASE) MCG/ACT inhaler  Commonly known as:  PROVENTIL HFA;VENTOLIN HFA  Inhale 2 puffs into the lungs every 6 (six) hours as needed for wheezing or shortness of breath.     amLODipine 10 MG tablet  Commonly known as:  NORVASC  Take 1 tablet (10 mg total) by mouth daily.     amoxicillin-clavulanate 875-125 MG per tablet  Commonly known as:  AUGMENTIN  Take 1 tablet by mouth 2 (two) times daily.     aspirin EC 325 MG tablet  Take 325 mg by mouth daily.     azaTHIOprine 50 MG tablet  Commonly known as:  IMURAN  Take 50 mg by mouth 2 (two) times daily.     cinacalcet 30 MG tablet  Commonly known as:  SENSIPAR  Take 1 tablet (30 mg total) by mouth daily.     docusate sodium 100 MG capsule  Commonly known as:  COLACE  Take 1 capsule (100 mg total) by mouth 2 (two) times daily.     esomeprazole 40 MG capsule  Commonly known as:  NEXIUM  Take 40 mg by mouth daily before breakfast.     feeding supplement (GLUCERNA SHAKE) Liqd  Take 237 mLs by mouth 2 (two) times daily between meals.     furosemide 80 MG tablet  Commonly known as:  LASIX  Take 160 mg by mouth 3 (three) times daily.     insulin aspart 100 UNIT/ML injection  Commonly known as:  novoLOG  Inject 13-16 Units into the skin 3 (three) times daily. Injects 13 units daily at breakfast, 13 units daily at lunch, and 16 units daily at supper.     insulin glargine 100 UNIT/ML injection  Commonly known as:  LANTUS  Inject 0.4 mLs (40 Units total) into the skin at bedtime.     isosorbide mononitrate 60 MG 24 hr tablet  Commonly known as:  IMDUR  Take 1 tablet (60 mg total) by mouth daily.     levothyroxine 50 MCG tablet  Commonly known as:  SYNTHROID, LEVOTHROID  Take 1 tablet (50 mcg total) by mouth daily before breakfast.     lidocaine-prilocaine cream  Commonly known as:  EMLA  Apply 1 application topically as needed (access port for  chemo). Use only on tuesdays and Thursday with chemo     LORazepam 0.5 MG tablet  Commonly known as:  ATIVAN  Take 1 tablet (0.5 mg total) by mouth every 6 (six) hours as needed (Nausea or vomiting).     Magnesium Oxide 250 MG Tabs  Take 250 mg by mouth daily.     metoprolol tartrate 25 MG tablet  Commonly known as:  LOPRESSOR  Take 1 tablet (25 mg total) by mouth 2 (two) times daily.     minoxidil 2.5 MG tablet  Commonly known as:  LONITEN  Take  2.5 mg by mouth 2 (two) times daily.     nitroGLYCERIN 0.4 MG SL tablet  Commonly known as:  NITROSTAT  Place 1 tablet (0.4 mg total) under the tongue every 5 (five) minutes as needed for chest pain.     ondansetron 8 MG tablet  Commonly known as:  ZOFRAN  Take 1 tablet (8 mg total) by mouth 2 (two) times daily. Take two times a day starting the day after chemo for 2 days. Then take two times a day as needed for nausea or vomiting.     polyethylene glycol packet  Commonly known as:  MIRALAX / GLYCOLAX  Take 17 g by mouth daily.     potassium chloride 10 MEQ tablet  Commonly known as:  KLOR-CON 10  Take 1 tablet (10 mEq total) by mouth 2 (two) times daily.     pravastatin 20 MG tablet  Commonly known as:  PRAVACHOL  Take 10 mg by mouth at bedtime.     prednisoLONE acetate 1 % ophthalmic suspension  Commonly known as:  PRED FORTE  Place 1 drop into the left eye 3 (three) times daily.     predniSONE 10 MG tablet  Commonly known as:  DELTASONE  Take 10 mg by mouth daily.     prochlorperazine 10 MG tablet  Commonly known as:  COMPAZINE  Take 1 tablet (10 mg total) by mouth every 6 (six) hours as needed (Nausea or vomiting).     tacrolimus 1 MG capsule  Commonly known as:  PROGRAF  Take 9 mg by mouth 2 (two) times daily.     timolol 0.25 % ophthalmic solution  Commonly known as:  TIMOPTIC  Place 1 drop into the right eye 2 (two) times daily.     traMADol 50 MG tablet  Commonly known as:  ULTRAM  Take 50 mg by mouth every  6 (six) hours as needed for pain.       Allergies  Allergen Reactions  . Cephalexin Swelling    Swelling to face, chills Tolerates zosyn 02/14/13  . Propoxyphene N-Acetaminophen Hives and Itching       Follow-up Information   Follow up with [name], MD. Schedule an appointment as soon as possible for a visit in 1 week.   Specialties:  Internal Medicine, Radiology   Contact information:   [place] [address] [phone]       Follow up with [facility], DO. Schedule an appointment as soon as possible for a visit on 03/01/2013.   Specialty:  Plastic Surgery   Contact information:   [place] [address] [phone]        The results of significant diagnostics from this hospitalization (including imaging, microbiology, ancillary and laboratory) are listed below for reference.    Significant Diagnostic Studies: Dg Femur Left  02/14/2013   CLINICAL DATA:  Fall with pain and swelling.  EXAM: LEFT FEMUR - 2 VIEW  COMPARISON:  Abdomen pelvis CT 02/09/2013  FINDINGS: There is an 8 cm area of subcutaneous gas in the anterior and proximal left thigh. There is no radiodense foreign body. No acute osseous findings, including fracture or osseous infection. Diffuse arterial calcification in this patient with history of diabetes.  These results were called by telephone at the time of interpretation on 02/14/2013 at 2:56 AM to Dr. Delora Fuel , who verbally acknowledged these results.  IMPRESSION: 8 cm area of subcutaneous gas in the anterior and upper thigh.  If there is no skin breech or recent procedure, findings concerning for necrotizing infection.   Electronically Signed   By: Jorje Guild M.D.   On: 02/14/2013 02:56   Ct Hip Left Wo Contrast  02/09/2013   CLINICAL DATA:  Pt with fall and left upper leg and hip pain evaluate for fracture  EXAM: CT OF THE LEFT HIP WITHOUT CONTRAST  TECHNIQUE: Multidetector CT imaging was performed according  to the standard protocol. Multiplanar CT image reconstructions were also generated.  COMPARISON:  None.  FINDINGS: There is no left hip fracture or dislocation. The left superior and inferior pubic rami are intact. There is soft tissue edema within the subcutaneous fat overlying the left proximal femur with skin thickening which may be secondary to direct trauma versus cellulitis.  There is no focal fluid collection. There is no hematoma. There is no soft tissue emphysema.  There is no lytic or blastic osseous lesion.  There is peripheral vascular atherosclerotic disease.  IMPRESSION: 1. No acute osseous injury of the left hip. 2. Soft tissue edema within the subcutaneous fat overlying the left proximal femur with skin thickening which may be secondary to direct trauma versus cellulitis.   Electronically Signed   By: Kathreen Devoid   On: 02/09/2013 12:44   Ct Femur Left Wo Contrast  02/14/2013   CLINICAL DATA:  Gas in upper thigh soft tissues.  EXAM: CT OF THE LEFT FEMUR WITHOUT CONTRAST  TECHNIQUE: Axial noncontrast 2.5 mm CT images through the left hip with coronal and sagittal reformations.  COMPARISON:  Left femur radiograph February 14, 2013.  FINDINGS: As seen on prior radiograph, there is subcutaneous emphysema along the anterolateral femoral soft tissues without subfascial extent. Associated skin thickening, and interstitial edema with thickened appearance of the fascia. Preservation the interim muscular fascia planes. No focal fluid collection. No radiopaque foreign bodies.  Dense vascular calcifications. No fracture or dislocation. No destructive bony lesions.  IMPRESSION: Left anterolateral femur subcutaneous emphysema with skin thickening and thickened fascia concerning for superficial necrotizing fasciitis, without subfascial extent. No abscess.  No acute fracture or dislocation.   Electronically Signed   By: Elon Alas   On: 02/14/2013 05:00    Microbiology: Recent Results (from the past 240  hour(s))  MRSA PCR SCREENING     Status: None   Collection Time    02/14/13  7:51 AM      Result Value Range Status   MRSA by PCR NEGATIVE  NEGATIVE Final   Comment:            The GeneXpert MRSA Assay (FDA     approved for NASAL specimens     only), is one component of a     comprehensive MRSA colonization     surveillance program. It is not     intended to diagnose MRSA     infection nor to guide or     monitor treatment for     MRSA infections.  ANAEROBIC CULTURE     Status: None   Collection Time    02/14/13 11:20 AM      Result Value Range Status   Specimen Description ABSCESS LEFT THIGH   Final   Special Requests NONE   Final   Gram Stain     Final   Value: NO WBC SEEN     NO SQUAMOUS EPITHELIAL CELLS SEEN     MODERATE GRAM NEGATIVE RODS     MODERATE GRAM POSITIVE RODS  RARE GRAM POSITIVE COCCI IN PAIRS   Culture     Final   Value: NO ANAEROBES ISOLATED     Performed at [facility]   Report Status 02/20/2013 FINAL   Final  CULTURE, ROUTINE-ABSCESS     Status: None   Collection Time    02/14/13 11:20 AM      Result Value Range Status   Specimen Description ABSCESS LEFT THIGH   Final   Special Requests NONE   Final   Gram Stain     Final   Value: RF WBC PRESENT, PREDOMINANTLY PMN     NO SQUAMOUS EPITHELIAL CELLS SEEN     ABUNDANT GRAM NEGATIVE RODS     ABUNDANT GRAM POSITIVE RODS     FEW GRAM POSITIVE COCCI IN PAIRS   Culture     Final   Value: ABUNDANT MICROAEROPHILIC STREPTOCOCCI     Note: Standardized susceptibility testing for this organism is not available.     Performed at [facility]   Report Status 02/17/2013 FINAL   Final     Labs: Basic Metabolic Panel:  Recent Labs Lab 02/17/13 0135 02/19/13 0520 02/22/13 1017  NA 134* 142  --   K 4.1 3.9  --   CL 94* 102  --   CO2 27 29  --   GLUCOSE 275* 73  --   BUN 28* 16  --   CREATININE 1.83* 1.13* 0.99  CALCIUM 8.4 8.2*  --    Liver Function Tests: No results found for  this basename: AST, ALT, ALKPHOS, BILITOT, PROT, ALBUMIN,  in the last 168 hours No results found for this basename: LIPASE, AMYLASE,  in the last 168 hours No results found for this basename: AMMONIA,  in the last 168 hours CBC:  Recent Labs Lab 02/17/13 0135 02/18/13 1840  WBC 11.1* 9.5  HGB 10.9* 11.0*  HCT 33.5* 34.5*  MCV 91.8 93.2  PLT 191 211   Cardiac Enzymes: No results found for this basename: CKTOTAL, CKMB, CKMBINDEX, TROPONINI,  in the last 168 hours BNP: BNP (last 3 results)  Recent Labs  11/11/12 0610 11/13/12 0440 11/15/12 0620  PROBNP 2197.0* 4098.0* 4299.0*   CBG:  Recent Labs Lab 02/22/13 1720 02/22/13 2055 02/23/13 0750 02/23/13 1152 02/23/13 1702  GLUCAP 138* 186* 139* 207* 123*       Signed:  Chaniece Barbato  [facility] 02/23/2013, 6:17 PM

## 2013-02-24 DIAGNOSIS — E11319 Type 2 diabetes mellitus with unspecified diabetic retinopathy without macular edema: Secondary | ICD-10-CM | POA: Diagnosis not present

## 2013-02-24 DIAGNOSIS — Z48817 Encounter for surgical aftercare following surgery on the skin and subcutaneous tissue: Secondary | ICD-10-CM | POA: Diagnosis not present

## 2013-02-24 DIAGNOSIS — E1165 Type 2 diabetes mellitus with hyperglycemia: Secondary | ICD-10-CM | POA: Diagnosis not present

## 2013-02-24 DIAGNOSIS — F489 Nonpsychotic mental disorder, unspecified: Secondary | ICD-10-CM | POA: Diagnosis not present

## 2013-02-24 DIAGNOSIS — I10 Essential (primary) hypertension: Secondary | ICD-10-CM | POA: Diagnosis not present

## 2013-02-24 DIAGNOSIS — L02419 Cutaneous abscess of limb, unspecified: Secondary | ICD-10-CM | POA: Diagnosis not present

## 2013-02-24 DIAGNOSIS — I5032 Chronic diastolic (congestive) heart failure: Secondary | ICD-10-CM | POA: Diagnosis not present

## 2013-02-24 DIAGNOSIS — E1139 Type 2 diabetes mellitus with other diabetic ophthalmic complication: Secondary | ICD-10-CM | POA: Diagnosis not present

## 2013-02-24 DIAGNOSIS — I252 Old myocardial infarction: Secondary | ICD-10-CM | POA: Diagnosis not present

## 2013-02-24 DIAGNOSIS — D649 Anemia, unspecified: Secondary | ICD-10-CM | POA: Diagnosis not present

## 2013-02-24 DIAGNOSIS — I509 Heart failure, unspecified: Secondary | ICD-10-CM | POA: Diagnosis not present

## 2013-02-24 DIAGNOSIS — M726 Necrotizing fasciitis: Secondary | ICD-10-CM | POA: Diagnosis not present

## 2013-02-26 DIAGNOSIS — L03119 Cellulitis of unspecified part of limb: Secondary | ICD-10-CM | POA: Diagnosis not present

## 2013-02-26 DIAGNOSIS — M726 Necrotizing fasciitis: Secondary | ICD-10-CM | POA: Diagnosis not present

## 2013-02-26 DIAGNOSIS — E1139 Type 2 diabetes mellitus with other diabetic ophthalmic complication: Secondary | ICD-10-CM | POA: Diagnosis not present

## 2013-02-26 DIAGNOSIS — L02419 Cutaneous abscess of limb, unspecified: Secondary | ICD-10-CM | POA: Diagnosis not present

## 2013-02-26 DIAGNOSIS — E11319 Type 2 diabetes mellitus with unspecified diabetic retinopathy without macular edema: Secondary | ICD-10-CM | POA: Diagnosis not present

## 2013-02-26 DIAGNOSIS — E1165 Type 2 diabetes mellitus with hyperglycemia: Secondary | ICD-10-CM | POA: Diagnosis not present

## 2013-02-26 DIAGNOSIS — F489 Nonpsychotic mental disorder, unspecified: Secondary | ICD-10-CM | POA: Diagnosis not present

## 2013-02-26 DIAGNOSIS — Z48817 Encounter for surgical aftercare following surgery on the skin and subcutaneous tissue: Secondary | ICD-10-CM | POA: Diagnosis not present

## 2013-03-01 ENCOUNTER — Telehealth: Payer: Self-pay | Admitting: Oncology

## 2013-03-01 ENCOUNTER — Telehealth: Payer: Self-pay | Admitting: Physician Assistant

## 2013-03-01 DIAGNOSIS — F489 Nonpsychotic mental disorder, unspecified: Secondary | ICD-10-CM | POA: Diagnosis not present

## 2013-03-01 DIAGNOSIS — Z48817 Encounter for surgical aftercare following surgery on the skin and subcutaneous tissue: Secondary | ICD-10-CM | POA: Diagnosis not present

## 2013-03-01 DIAGNOSIS — E1139 Type 2 diabetes mellitus with other diabetic ophthalmic complication: Secondary | ICD-10-CM | POA: Diagnosis not present

## 2013-03-01 DIAGNOSIS — E11319 Type 2 diabetes mellitus with unspecified diabetic retinopathy without macular edema: Secondary | ICD-10-CM | POA: Diagnosis not present

## 2013-03-01 DIAGNOSIS — L02419 Cutaneous abscess of limb, unspecified: Secondary | ICD-10-CM | POA: Diagnosis not present

## 2013-03-01 DIAGNOSIS — M726 Necrotizing fasciitis: Secondary | ICD-10-CM | POA: Diagnosis not present

## 2013-03-01 NOTE — Telephone Encounter (Signed)
Patient called our office requesting pain medication but has not been seen by Dr. Migdalia Dk as a patient yet. She apparently is to be seen in the [facility] next week.  She was referred back to [facility] and her PCP for medication refills for now.   Monica Vidaurri,PA-C Plastic Surgery [phone]

## 2013-03-01 NOTE — Telephone Encounter (Signed)
pt dtr in law called and r/s 2/17 appt to 2/19 due to pending weather.

## 2013-03-02 ENCOUNTER — Telehealth (INDEPENDENT_AMBULATORY_CARE_PROVIDER_SITE_OTHER): Payer: Self-pay | Admitting: *Deleted

## 2013-03-02 ENCOUNTER — Ambulatory Visit: Payer: Medicare Other | Admitting: Adult Health

## 2013-03-02 ENCOUNTER — Other Ambulatory Visit: Payer: Medicare Other

## 2013-03-02 DIAGNOSIS — S71109A Unspecified open wound, unspecified thigh, initial encounter: Principal | ICD-10-CM

## 2013-03-02 DIAGNOSIS — S71009A Unspecified open wound, unspecified hip, initial encounter: Secondary | ICD-10-CM

## 2013-03-02 MED ORDER — HYDROCODONE-ACETAMINOPHEN 5-325 MG PO TABS: 1.0000 | ORAL_TABLET | ORAL | Status: DC | PRN

## 2013-03-02 NOTE — Telephone Encounter (Signed)
Patient's niece called to request refill of pain medication.  She states patient continues to have severe pain on her thigh were debridement was done of a wound.  Explained that I will send a message to Dr. Donne Hazel to get his opinion on this then we will let her know.  Niece states understanding at this time.

## 2013-03-02 NOTE — Telephone Encounter (Signed)
LMOM stating that there is a written rx at the front desk for p/u for Norco 5/325mg  #30 per Dr Donne Hazel.

## 2013-03-02 NOTE — Addendum Note (Signed)
Addended by: Illene Regulus on: 03/02/2013 12:47 PM   Modules accepted: Orders

## 2013-03-03 DIAGNOSIS — E1165 Type 2 diabetes mellitus with hyperglycemia: Secondary | ICD-10-CM | POA: Diagnosis not present

## 2013-03-03 DIAGNOSIS — L03119 Cellulitis of unspecified part of limb: Secondary | ICD-10-CM | POA: Diagnosis not present

## 2013-03-03 DIAGNOSIS — L02419 Cutaneous abscess of limb, unspecified: Secondary | ICD-10-CM | POA: Diagnosis not present

## 2013-03-03 DIAGNOSIS — E11319 Type 2 diabetes mellitus with unspecified diabetic retinopathy without macular edema: Secondary | ICD-10-CM | POA: Diagnosis not present

## 2013-03-03 DIAGNOSIS — F489 Nonpsychotic mental disorder, unspecified: Secondary | ICD-10-CM | POA: Diagnosis not present

## 2013-03-03 DIAGNOSIS — M726 Necrotizing fasciitis: Secondary | ICD-10-CM | POA: Diagnosis not present

## 2013-03-03 DIAGNOSIS — Z48817 Encounter for surgical aftercare following surgery on the skin and subcutaneous tissue: Secondary | ICD-10-CM | POA: Diagnosis not present

## 2013-03-03 DIAGNOSIS — E1139 Type 2 diabetes mellitus with other diabetic ophthalmic complication: Secondary | ICD-10-CM | POA: Diagnosis not present

## 2013-03-04 ENCOUNTER — Telehealth: Payer: Self-pay | Admitting: *Deleted

## 2013-03-04 ENCOUNTER — Other Ambulatory Visit (HOSPITAL_BASED_OUTPATIENT_CLINIC_OR_DEPARTMENT_OTHER): Payer: Medicare Other

## 2013-03-04 ENCOUNTER — Encounter: Payer: Self-pay | Admitting: Adult Health

## 2013-03-04 ENCOUNTER — Ambulatory Visit: Payer: Medicare Other | Admitting: Internal Medicine

## 2013-03-04 ENCOUNTER — Ambulatory Visit (HOSPITAL_BASED_OUTPATIENT_CLINIC_OR_DEPARTMENT_OTHER): Payer: Medicare Other | Admitting: Adult Health

## 2013-03-04 VITALS — BP 123/75 | HR 74 | Temp 98.0°F | Resp 18 | Ht 65.0 in | Wt 187.1 lb

## 2013-03-04 DIAGNOSIS — Z171 Estrogen receptor negative status [ER-]: Secondary | ICD-10-CM | POA: Diagnosis not present

## 2013-03-04 DIAGNOSIS — C50511 Malignant neoplasm of lower-outer quadrant of right female breast: Secondary | ICD-10-CM

## 2013-03-04 DIAGNOSIS — I509 Heart failure, unspecified: Secondary | ICD-10-CM

## 2013-03-04 DIAGNOSIS — C50519 Malignant neoplasm of lower-outer quadrant of unspecified female breast: Secondary | ICD-10-CM

## 2013-03-04 DIAGNOSIS — E119 Type 2 diabetes mellitus without complications: Secondary | ICD-10-CM

## 2013-03-04 LAB — COMPREHENSIVE METABOLIC PANEL (CC13)
ALBUMIN: 2.9 g/dL — AB (ref 3.5–5.0)
ALT: 13 U/L (ref 0–55)
AST: 19 U/L (ref 5–34)
Alkaline Phosphatase: 87 U/L (ref 40–150)
Anion Gap: 11 mEq/L (ref 3–11)
BUN: 15.7 mg/dL (ref 7.0–26.0)
CALCIUM: 9.2 mg/dL (ref 8.4–10.4)
CO2: 29 mEq/L (ref 22–29)
CREATININE: 1.2 mg/dL — AB (ref 0.6–1.1)
Chloride: 97 mEq/L — ABNORMAL LOW (ref 98–109)
Glucose: 142 mg/dl — ABNORMAL HIGH (ref 70–140)
POTASSIUM: 4.6 meq/L (ref 3.5–5.1)
Sodium: 136 mEq/L (ref 136–145)
Total Bilirubin: 0.47 mg/dL (ref 0.20–1.20)
Total Protein: 6.6 g/dL (ref 6.4–8.3)

## 2013-03-04 LAB — CBC WITH DIFFERENTIAL/PLATELET
BASO%: 0.8 % (ref 0.0–2.0)
Basophils Absolute: 0.1 10*3/uL (ref 0.0–0.1)
EOS%: 1.1 % (ref 0.0–7.0)
Eosinophils Absolute: 0.1 10*3/uL (ref 0.0–0.5)
HEMATOCRIT: 31.8 % — AB (ref 34.8–46.6)
HEMOGLOBIN: 9.9 g/dL — AB (ref 11.6–15.9)
LYMPH#: 0.9 10*3/uL (ref 0.9–3.3)
LYMPH%: 13.2 % — ABNORMAL LOW (ref 14.0–49.7)
MCH: 29.5 pg (ref 25.1–34.0)
MCHC: 31.1 g/dL — AB (ref 31.5–36.0)
MCV: 94.6 fL (ref 79.5–101.0)
MONO#: 0.8 10*3/uL (ref 0.1–0.9)
MONO%: 12.7 % (ref 0.0–14.0)
NEUT%: 72.2 % (ref 38.4–76.8)
NEUTROS ABS: 4.8 10*3/uL (ref 1.5–6.5)
Platelets: 203 10*3/uL (ref 145–400)
RBC: 3.36 10*6/uL — ABNORMAL LOW (ref 3.70–5.45)
RDW: 18.7 % — AB (ref 11.2–14.5)
WBC: 6.6 10*3/uL (ref 3.9–10.3)

## 2013-03-04 NOTE — Progress Notes (Signed)
OFFICE PROGRESS NOTE  CC**  [name], MD [place] [address] Dr. Arloa Koh  Dr. 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   STAGE:  Breast cancer of lower-outer quadrant of right female breast  Primary site: Breast (Right)  Staging method: AJCC 7th Edition  Clinical: Stage IIA (T2, N0, cM0)  Summary: Stage IIA (T2, N0, cM0)  PRIOR THERAPY:  #1 Who felt a mass along the inferior aspect of the right breast at about the 7:00 position. On 10/08/2012 she had a mammogram performed that showed a irregular mass with internal pleomorphic calcifications. Ultrasound showed a suspicious 2.1 x 1.3 x 1.6 cm mass in the inframammary fold. A needle core biopsy performed on the same day was diagnostic for invasive ductal carcinoma with associated DCIS. The tumor was ER negative PR negative and HER-2/neu negative. The proliferation marker Ki-67 was elevated at 50% and the tumor was grade 2.   #2 patient is status post right lumpectomy with sentinel lymph node biopsy. Her final pathology revealed 2.2 cm invasive ductal carcinoma that was grade 3 that was triple negative with a proliferation marker Ki-67 at 56%.   #3 Curative intent adjuvant chemotherapy beginning December 2014 with CMF every 21 days for total of 6 cycles   CURRENT THERAPY: CMF chemotherapy on hold  INTERVAL HISTORY: [name] 63 y.o. female returns for followup visit. She recently sustained a fall and developed a left leg hematoma that became infected and required debridement.  She currently has a wound vac.  She denies fevers and chills, and her wound is being managed by Dr. Donne Hazel.  She does take Norco and it does help her pain.  Otherwise, a 10 point ROS is negative.    MEDICAL HISTORY: Past Medical History  Diagnosis Date  . Diabetes mellitus   . Acid reflux   . S/P kidney transplant   . GERD  (gastroesophageal reflux disease)   . Hyperlipidemia   . DJD (degenerative joint disease)   . TIA (transient ischemic attack)   . Hx of colonic polyps   . PUD (peptic ulcer disease)   . Diabetic retinopathy   . Hemorrhoids   . Generalized headaches   . Hypertension   . Asthma   . MI (myocardial infarction) 2011  . Kidney Atkison   . Mental disorder   . Stroke 2008    no deficits  . Anemia     Hx-not current  . Breast cancer   . Blind left eye   . Pneumonia   . Chronic diastolic CHF (congestive heart failure)     a. Cardiac MRI (10/2012): Moderate to severe LVH, EF 55%, chordal SAM but no LVOT gradient, mod circumferential effusion, mild LAE.  b.  Echocardiogram (11/06/12): Severe LVH, EF 05-39%, grade 1 diastolic dysfunction, moderate effusion.  Marland Kitchen Hx of cardiovascular stress test     a. Lexiscan Myoview (11/05/12): High-risk scan; fixed defect affecting the entire inferolateral and anterolateral wall; mid/apical anterior and mid/apical inferior moderate ischemia, EF 28%.  (felt to be false + as echo and cMRI with normal EF and normal WM)  . Hx of cardiac cath     a. reportedly no CAD on cardiac cath in [place]  . Wears glasses     ALLERGIES:  is allergic to cephalexin and propoxyphene n-acetaminophen.  MEDICATIONS:  Current Outpatient Prescriptions  Medication Sig Dispense Refill  . amLODipine (  NORVASC) 10 MG tablet Take 1 tablet (10 mg total) by mouth daily.  90 tablet  3  . amoxicillin-clavulanate (AUGMENTIN) 875-125 MG per tablet Take 1 tablet by mouth 2 (two) times daily.  14 tablet  0  . aspirin EC 325 MG tablet Take 325 mg by mouth daily.        Marland Kitchen azaTHIOprine (IMURAN) 50 MG tablet Take 50 mg by mouth 2 (two) times daily.        . cinacalcet (SENSIPAR) 30 MG tablet Take 1 tablet (30 mg total) by mouth daily.  30 tablet  11  . docusate sodium (COLACE) 100 MG capsule Take 1 capsule (100 mg total) by mouth 2 (two) times daily.  60 capsule  0  . esomeprazole (NEXIUM) 40 MG  capsule Take 40 mg by mouth daily before breakfast.      . feeding supplement, GLUCERNA SHAKE, (GLUCERNA SHAKE) LIQD Take 237 mLs by mouth 2 (two) times daily between meals.    0  . furosemide (LASIX) 80 MG tablet Take 160 mg by mouth 2 (two) times daily.       Marland Kitchen HYDROcodone-acetaminophen (NORCO/VICODIN) 5-325 MG per tablet Take 1-2 tablets by mouth every 4 (four) hours as needed for moderate pain.  30 tablet  0  . insulin aspart (NOVOLOG) 100 UNIT/ML injection Inject 13-16 Units into the skin 3 (three) times daily. Injects 13 units daily at breakfast, 13 units daily at lunch, and 16 units daily at supper.      . insulin glargine (LANTUS) 100 UNIT/ML injection Inject 55 Units into the skin at bedtime.      . isosorbide mononitrate (IMDUR) 60 MG 24 hr tablet Take 1 tablet (60 mg total) by mouth daily.  90 tablet  3  . levothyroxine (SYNTHROID, LEVOTHROID) 50 MCG tablet Take 1 tablet (50 mcg total) by mouth daily before breakfast.  30 tablet  0  . lidocaine-prilocaine (EMLA) cream Apply 1 application topically as needed (access port for chemo). Use only on tuesdays and Thursday with chemo      . LORazepam (ATIVAN) 0.5 MG tablet Take 1 tablet (0.5 mg total) by mouth every 6 (six) hours as needed (Nausea or vomiting).  30 tablet  0  . Magnesium Oxide 250 MG TABS Take 250 mg by mouth daily.       . metoprolol tartrate (LOPRESSOR) 25 MG tablet Take 1 tablet (25 mg total) by mouth 2 (two) times daily.  60 tablet  1  . minoxidil (LONITEN) 2.5 MG tablet Take 2.5 mg by mouth 2 (two) times daily.      . polyethylene glycol (MIRALAX / GLYCOLAX) packet Take 17 g by mouth daily.  14 each  11  . potassium chloride (K-DUR) 10 MEQ tablet Take 10 mEq by mouth daily.      . pravastatin (PRAVACHOL) 20 MG tablet Take 10 mg by mouth at bedtime.        . prednisoLONE acetate (PRED FORTE) 1 % ophthalmic suspension Place 1 drop into the left eye 3 (three) times daily.      . predniSONE (DELTASONE) 10 MG tablet Take 10 mg by  mouth daily.      . tacrolimus (PROGRAF) 1 MG capsule Take 9 mg by mouth 2 (two) times daily.       . timolol (TIMOPTIC) 0.25 % ophthalmic solution Place 1 drop into the right eye 2 (two) times daily.      . traMADol (ULTRAM) 50 MG tablet Take 50 mg by  mouth every 6 (six) hours as needed for pain.      Marland Kitchen albuterol (ACCUNEB) 0.63 MG/3ML nebulizer solution Take 1 ampule by nebulization every 6 (six) hours as needed for wheezing.      Marland Kitchen albuterol (PROVENTIL HFA;VENTOLIN HFA) 108 (90 BASE) MCG/ACT inhaler Inhale 2 puffs into the lungs every 6 (six) hours as needed for wheezing or shortness of breath.      . nitroGLYCERIN (NITROSTAT) 0.4 MG SL tablet Place 1 tablet (0.4 mg total) under the tongue every 5 (five) minutes as needed for chest pain.  25 tablet  11  . ondansetron (ZOFRAN) 8 MG tablet Take 1 tablet (8 mg total) by mouth 2 (two) times daily. Take two times a day starting the day after chemo for 2 days. Then take two times a day as needed for nausea or vomiting.  30 tablet  1  . prochlorperazine (COMPAZINE) 10 MG tablet Take 1 tablet (10 mg total) by mouth every 6 (six) hours as needed (Nausea or vomiting).  30 tablet  1   No current facility-administered medications for this visit.    SURGICAL HISTORY:  Past Surgical History  Procedure Laterality Date  . Transplantation renal  05/2006  . Cardiac catheterization  2011  . Abdominal hysterectomy    . Cataract extraction  bil  . Intestinal perforation surgery  01/2009  . Breast biopsy  2010  . Gallstones removed    . Cholecystectomy  2008  . Colon surgery  2013    polyps removed  . Hemorrhoid surgery  01/23/2012    Procedure: HEMORRHOIDECTOMY PROLAPSED;  Surgeon: Leighton Ruff, MD;  Location: [facility];  Service: General;  Laterality: N/A;  . Eye surgery Left 1997  . Foot surgery Right 2013  . Breast lumpectomy with sentinel lymph node biopsy Right 11/09/2012    Procedure: RIGHT BREAST LUMPECTOMY WITH SENTINEL LYMPH NODE  REMOVAL;  Surgeon: Haywood Lasso, MD;  Location: [place];  Service: General;  Laterality: Right;  . Portacath placement Right 11/30/2012    Procedure: INSERTION PORT-A-CATH;  Surgeon: Haywood Lasso, MD;  Location: [place];  Service: General;  Laterality: Right;  . Irrigation and debridement abscess Left 02/14/2013    Procedure: IRRIGATION AND DEBRIDEMENT LEFT THIGH ABSCESS;  Surgeon: Joyice Faster. Cornett, MD;  Location: [place];  Service: General;  Laterality: Left;  . Incision and drainage of wound Left 02/18/2013    Procedure: IRRIGATION AND DEBRIDEMENT THIGH WOUND;  Surgeon: Rolm Bookbinder, MD;  Location: [facility];  Service: General;  Laterality: Left;    REVIEW OF SYSTEMS:  A 10 point review of systems was conducted and is otherwise negative except for what is noted above.    PHYSICAL EXAMINATION: Blood pressure 123/75, pulse 74, temperature 98 F (36.7 C), temperature source Oral, resp. rate 18, height _0  (1.651 m), weight 187 lb 1.6 oz (84.868 kg). Body mass index is 31.14 kg/(m^2). GENERAL: Patient is a well appearing female in no acute distress HEENT:  Sclerae anicteric.  Oropharynx clear and moist. No ulcerations or evidence of oropharyngeal candidiasis. Neck is supple.  NODES:  No cervical, supraclavicular, or axillary lymphadenopathy palpated.  BREAST EXAM:  Deferred. LUNGS:  Clear to auscultation bilaterally.  No wheezes or rhonchi. HEART:  Regular rate and rhythm. No murmur appreciated. ABDOMEN:  Soft, nontender.  Positive, normoactive bowel sounds. No organomegaly palpated. MSK:  No focal spinal tenderness to palpation. Full range of motion bilaterally in the upper extremities. EXTREMITIES:  No  peripheral edema.   SKIN:  Clear with no obvious rashes or skin changes. No nail dyscrasia.  Wound along entire thigh with wound vac in place.   NEURO:  Nonfocal. Well oriented.  Appropriate affect. ECOG PERFORMANCE STATUS: 0 - Asymptomatic      LABORATORY  DATA: Lab Results  Component Value Date   WBC 6.6 03/04/2013   HGB 9.9* 03/04/2013   HCT 31.8* 03/04/2013   MCV 94.6 03/04/2013   PLT 203 03/04/2013      Chemistry      Component Value Date/Time   NA 136 03/04/2013 1027   NA 142 02/19/2013 0520   K 4.6 03/04/2013 1027   K 3.9 02/19/2013 0520   CL 102 02/19/2013 0520   CO2 29 03/04/2013 1027   CO2 29 02/19/2013 0520   BUN 15.7 03/04/2013 1027   BUN 16 02/19/2013 0520   CREATININE 1.2* 03/04/2013 1027   CREATININE 0.99 02/22/2013 1017      Component Value Date/Time   CALCIUM 9.2 03/04/2013 1027   CALCIUM 8.2* 02/19/2013 0520   ALKPHOS 87 03/04/2013 1027   ALKPHOS 69 02/15/2013 0317   AST 19 03/04/2013 1027   AST 26 02/15/2013 0317   ALT 13 03/04/2013 1027   ALT 16 02/15/2013 0317   BILITOT 0.47 03/04/2013 1027   BILITOT 0.4 02/15/2013 0317       RADIOGRAPHIC STUDIES:  Ct Hip Left Wo Contrast  02/09/2013   CLINICAL DATA:  Pt with fall and left upper leg and hip pain evaluate for fracture  EXAM: CT OF THE LEFT HIP WITHOUT CONTRAST  TECHNIQUE: Multidetector CT imaging was performed according to the standard protocol. Multiplanar CT image reconstructions were also generated.  COMPARISON:  None.  FINDINGS: There is no left hip fracture or dislocation. The left superior and inferior pubic rami are intact. There is soft tissue edema within the subcutaneous fat overlying the left proximal femur with skin thickening which may be secondary to direct trauma versus cellulitis.  There is no focal fluid collection. There is no hematoma. There is no soft tissue emphysema.  There is no lytic or blastic osseous lesion.  There is peripheral vascular atherosclerotic disease.  IMPRESSION: 1. No acute osseous injury of the left hip. 2. Soft tissue edema within the subcutaneous fat overlying the left proximal femur with skin thickening which may be secondary to direct trauma versus cellulitis.   Electronically Signed   By: Kathreen Devoid   On: 02/09/2013 12:44    ASSESSMENT/PLAN:  63 year old female with  #1 stage II invasive ductal carcinoma ER negative PR negative HER-2/neu negative (triple-negative) with a proliferation marker Ki-67 56%. Patient status post lumpectomy of the right breast with sentinel lymph node biopsy. The final pathology did reveal a grade 3 invasive ductal carcinoma triple negative measuring 2.2 cm. Sentinel nodes were negative for metastatic disease.  #2 patient currently receiving adjuvant curative intent chemotherapy consisting of CMF every 21 day cycle starting December 2014. Thus far she has done well. She has now completed 3 cycles out of the 6 planned cycles. We will await giving the patient further chemotherapy until the wound has completely healed.    #3 diabetes: Overall patient remains stable she is followed with her primary care physician on a regular basis.  #4 Patient will return in 2 weeks for labs and evaluation.    All questions were answered. The patient knows to call the clinic with any problems, questions or concerns. We can certainly see the patient much  sooner if necessary.  I spent 15 minutes counseling the patient face to face. The total time spent in the appointment was 30 minutes.  Minette Headland, [place] [phone] 03/04/2013, 11:32 AM

## 2013-03-04 NOTE — Telephone Encounter (Signed)
appts made and printed...td 

## 2013-03-05 ENCOUNTER — Encounter (HOSPITAL_BASED_OUTPATIENT_CLINIC_OR_DEPARTMENT_OTHER): Payer: Medicare Other | Attending: General Surgery

## 2013-03-05 DIAGNOSIS — M726 Necrotizing fasciitis: Secondary | ICD-10-CM | POA: Diagnosis not present

## 2013-03-05 DIAGNOSIS — I10 Essential (primary) hypertension: Secondary | ICD-10-CM | POA: Insufficient documentation

## 2013-03-05 DIAGNOSIS — C50919 Malignant neoplasm of unspecified site of unspecified female breast: Secondary | ICD-10-CM | POA: Insufficient documentation

## 2013-03-05 DIAGNOSIS — IMO0002 Reserved for concepts with insufficient information to code with codable children: Secondary | ICD-10-CM | POA: Insufficient documentation

## 2013-03-05 DIAGNOSIS — Z8673 Personal history of transient ischemic attack (TIA), and cerebral infarction without residual deficits: Secondary | ICD-10-CM | POA: Insufficient documentation

## 2013-03-05 DIAGNOSIS — E119 Type 2 diabetes mellitus without complications: Secondary | ICD-10-CM | POA: Diagnosis not present

## 2013-03-05 DIAGNOSIS — S71109A Unspecified open wound, unspecified thigh, initial encounter: Secondary | ICD-10-CM | POA: Diagnosis not present

## 2013-03-05 DIAGNOSIS — Z794 Long term (current) use of insulin: Secondary | ICD-10-CM | POA: Insufficient documentation

## 2013-03-05 DIAGNOSIS — X58XXXA Exposure to other specified factors, initial encounter: Secondary | ICD-10-CM | POA: Insufficient documentation

## 2013-03-05 DIAGNOSIS — Z940 Kidney transplant status: Secondary | ICD-10-CM | POA: Insufficient documentation

## 2013-03-05 DIAGNOSIS — S71009A Unspecified open wound, unspecified hip, initial encounter: Secondary | ICD-10-CM | POA: Insufficient documentation

## 2013-03-05 DIAGNOSIS — I252 Old myocardial infarction: Secondary | ICD-10-CM | POA: Insufficient documentation

## 2013-03-05 DIAGNOSIS — Z79899 Other long term (current) drug therapy: Secondary | ICD-10-CM | POA: Diagnosis not present

## 2013-03-06 NOTE — Progress Notes (Signed)
[facility]  NAME:  Monica Lee, Monica Lee               ACCOUNT NO.:  [id number]  MEDICAL RECORD NO.:  [id number]      DATE OF BIRTH:  29-Nov-1950  PHYSICIAN:  Judene Companion, M.D.           VISIT DATE:                                  OFFICE VISIT   She is a very obese 63 year old, African-American female who apparently fell and suffered a hematoma of her left anterior thigh.  This later developed into necrotizing fasciitis, and Dr. Durward Fortes, took her to the operating room and debrided all of the necrotic skin and fat.  She is left with a huge wound of granulation tissue right down to the muscle with tendon showing on the lateral side of the wound.  This lady has diabetes.  She has had a kidney transplant.  She has had TIAs, she has diabetic retinopathy, hypertension, asthma.  She also had a myocardial infarction, and has a history of breast cancer, and is presently undergoing chemotherapy for this problem.  She is on many medicines including Sensipar, Decadron, Imuran, Lasix, NovoLog, Lantus insulin, albuterol, amlodipine, vitamins, minoxidil, prednisone, Prograf, which is tacrolimus.  She today was found to have a blood pressure of 103/58, respirations 16, pulse 75, temperature 98.  I debrided this with a curette.  I understand, Dr. Migdalia Dk has seen this patient and is planning on doing a skin graft when the wound is suitable for this procedure, so she will come back here next week and in the meantime, I am dressing it with silver alginate and she is on a wound VAC, and we applied a wound VAC today.  Her diagnoses is: 1. History of necrotizing fasciitis, left thigh with large open wound. 2. Diabetes. 3. Hypertension. 4. History of breast cancer on chemotherapy. 5. History of stroke. 6. History of myocardial infarction.     Judene Companion, M.D.     PP/MEDQ  D:  03/05/2013  T:  03/06/2013  Job:  [id number]

## 2013-03-07 DIAGNOSIS — E11319 Type 2 diabetes mellitus with unspecified diabetic retinopathy without macular edema: Secondary | ICD-10-CM | POA: Diagnosis not present

## 2013-03-07 DIAGNOSIS — M726 Necrotizing fasciitis: Secondary | ICD-10-CM | POA: Diagnosis not present

## 2013-03-07 DIAGNOSIS — Z48817 Encounter for surgical aftercare following surgery on the skin and subcutaneous tissue: Secondary | ICD-10-CM | POA: Diagnosis not present

## 2013-03-07 DIAGNOSIS — E1165 Type 2 diabetes mellitus with hyperglycemia: Secondary | ICD-10-CM | POA: Diagnosis not present

## 2013-03-07 DIAGNOSIS — F489 Nonpsychotic mental disorder, unspecified: Secondary | ICD-10-CM | POA: Diagnosis not present

## 2013-03-07 DIAGNOSIS — L02419 Cutaneous abscess of limb, unspecified: Secondary | ICD-10-CM | POA: Diagnosis not present

## 2013-03-07 DIAGNOSIS — E1139 Type 2 diabetes mellitus with other diabetic ophthalmic complication: Secondary | ICD-10-CM | POA: Diagnosis not present

## 2013-03-08 ENCOUNTER — Other Ambulatory Visit: Payer: Medicare Other

## 2013-03-08 DIAGNOSIS — M726 Necrotizing fasciitis: Secondary | ICD-10-CM | POA: Diagnosis not present

## 2013-03-08 DIAGNOSIS — F489 Nonpsychotic mental disorder, unspecified: Secondary | ICD-10-CM | POA: Diagnosis not present

## 2013-03-08 DIAGNOSIS — E11319 Type 2 diabetes mellitus with unspecified diabetic retinopathy without macular edema: Secondary | ICD-10-CM | POA: Diagnosis not present

## 2013-03-08 DIAGNOSIS — L02419 Cutaneous abscess of limb, unspecified: Secondary | ICD-10-CM | POA: Diagnosis not present

## 2013-03-08 DIAGNOSIS — Z48817 Encounter for surgical aftercare following surgery on the skin and subcutaneous tissue: Secondary | ICD-10-CM | POA: Diagnosis not present

## 2013-03-08 DIAGNOSIS — E1139 Type 2 diabetes mellitus with other diabetic ophthalmic complication: Secondary | ICD-10-CM | POA: Diagnosis not present

## 2013-03-08 DIAGNOSIS — E1165 Type 2 diabetes mellitus with hyperglycemia: Secondary | ICD-10-CM | POA: Diagnosis not present

## 2013-03-09 DIAGNOSIS — Z48817 Encounter for surgical aftercare following surgery on the skin and subcutaneous tissue: Secondary | ICD-10-CM | POA: Diagnosis not present

## 2013-03-09 DIAGNOSIS — E1139 Type 2 diabetes mellitus with other diabetic ophthalmic complication: Secondary | ICD-10-CM | POA: Diagnosis not present

## 2013-03-09 DIAGNOSIS — E11319 Type 2 diabetes mellitus with unspecified diabetic retinopathy without macular edema: Secondary | ICD-10-CM | POA: Diagnosis not present

## 2013-03-09 DIAGNOSIS — M726 Necrotizing fasciitis: Secondary | ICD-10-CM | POA: Diagnosis not present

## 2013-03-09 DIAGNOSIS — F489 Nonpsychotic mental disorder, unspecified: Secondary | ICD-10-CM | POA: Diagnosis not present

## 2013-03-09 DIAGNOSIS — L02419 Cutaneous abscess of limb, unspecified: Secondary | ICD-10-CM | POA: Diagnosis not present

## 2013-03-09 DIAGNOSIS — L03119 Cellulitis of unspecified part of limb: Secondary | ICD-10-CM | POA: Diagnosis not present

## 2013-03-10 DIAGNOSIS — L02419 Cutaneous abscess of limb, unspecified: Secondary | ICD-10-CM | POA: Diagnosis not present

## 2013-03-10 DIAGNOSIS — M726 Necrotizing fasciitis: Secondary | ICD-10-CM | POA: Diagnosis not present

## 2013-03-10 DIAGNOSIS — E11319 Type 2 diabetes mellitus with unspecified diabetic retinopathy without macular edema: Secondary | ICD-10-CM | POA: Diagnosis not present

## 2013-03-10 DIAGNOSIS — E1139 Type 2 diabetes mellitus with other diabetic ophthalmic complication: Secondary | ICD-10-CM | POA: Diagnosis not present

## 2013-03-10 DIAGNOSIS — F489 Nonpsychotic mental disorder, unspecified: Secondary | ICD-10-CM | POA: Diagnosis not present

## 2013-03-10 DIAGNOSIS — Z48817 Encounter for surgical aftercare following surgery on the skin and subcutaneous tissue: Secondary | ICD-10-CM | POA: Diagnosis not present

## 2013-03-12 DIAGNOSIS — L03119 Cellulitis of unspecified part of limb: Secondary | ICD-10-CM | POA: Diagnosis not present

## 2013-03-12 DIAGNOSIS — F489 Nonpsychotic mental disorder, unspecified: Secondary | ICD-10-CM | POA: Diagnosis not present

## 2013-03-12 DIAGNOSIS — E1139 Type 2 diabetes mellitus with other diabetic ophthalmic complication: Secondary | ICD-10-CM | POA: Diagnosis not present

## 2013-03-12 DIAGNOSIS — Z48817 Encounter for surgical aftercare following surgery on the skin and subcutaneous tissue: Secondary | ICD-10-CM | POA: Diagnosis not present

## 2013-03-12 DIAGNOSIS — E11319 Type 2 diabetes mellitus with unspecified diabetic retinopathy without macular edema: Secondary | ICD-10-CM | POA: Diagnosis not present

## 2013-03-12 DIAGNOSIS — M726 Necrotizing fasciitis: Secondary | ICD-10-CM | POA: Diagnosis not present

## 2013-03-12 DIAGNOSIS — E1165 Type 2 diabetes mellitus with hyperglycemia: Secondary | ICD-10-CM | POA: Diagnosis not present

## 2013-03-12 DIAGNOSIS — L02419 Cutaneous abscess of limb, unspecified: Secondary | ICD-10-CM | POA: Diagnosis not present

## 2013-03-13 ENCOUNTER — Other Ambulatory Visit: Payer: Self-pay | Admitting: Internal Medicine

## 2013-03-15 ENCOUNTER — Encounter (HOSPITAL_BASED_OUTPATIENT_CLINIC_OR_DEPARTMENT_OTHER): Payer: Medicare Other | Attending: General Surgery

## 2013-03-15 ENCOUNTER — Telehealth: Payer: Self-pay

## 2013-03-15 DIAGNOSIS — L97109 Non-pressure chronic ulcer of unspecified thigh with unspecified severity: Secondary | ICD-10-CM | POA: Diagnosis not present

## 2013-03-15 DIAGNOSIS — S7010XA Contusion of unspecified thigh, initial encounter: Secondary | ICD-10-CM | POA: Diagnosis not present

## 2013-03-15 DIAGNOSIS — IMO0002 Reserved for concepts with insufficient information to code with codable children: Secondary | ICD-10-CM | POA: Insufficient documentation

## 2013-03-15 NOTE — Telephone Encounter (Signed)
The patient called asking if her pain medication can be sent to the drug store because she is unable to come by and pick up the rx.

## 2013-03-16 DIAGNOSIS — E1165 Type 2 diabetes mellitus with hyperglycemia: Secondary | ICD-10-CM | POA: Diagnosis not present

## 2013-03-16 DIAGNOSIS — L03119 Cellulitis of unspecified part of limb: Secondary | ICD-10-CM | POA: Diagnosis not present

## 2013-03-16 DIAGNOSIS — M726 Necrotizing fasciitis: Secondary | ICD-10-CM | POA: Diagnosis not present

## 2013-03-16 DIAGNOSIS — E11319 Type 2 diabetes mellitus with unspecified diabetic retinopathy without macular edema: Secondary | ICD-10-CM | POA: Diagnosis not present

## 2013-03-16 DIAGNOSIS — L02419 Cutaneous abscess of limb, unspecified: Secondary | ICD-10-CM | POA: Diagnosis not present

## 2013-03-16 DIAGNOSIS — F489 Nonpsychotic mental disorder, unspecified: Secondary | ICD-10-CM | POA: Diagnosis not present

## 2013-03-16 DIAGNOSIS — Z48817 Encounter for surgical aftercare following surgery on the skin and subcutaneous tissue: Secondary | ICD-10-CM | POA: Diagnosis not present

## 2013-03-16 DIAGNOSIS — E1139 Type 2 diabetes mellitus with other diabetic ophthalmic complication: Secondary | ICD-10-CM | POA: Diagnosis not present

## 2013-03-16 NOTE — Telephone Encounter (Signed)
Called left message to call back 

## 2013-03-16 NOTE — Telephone Encounter (Signed)
Faxed hardcopy to [employer] [facility].

## 2013-03-16 NOTE — Telephone Encounter (Signed)
rx already done to [facility]

## 2013-03-16 NOTE — Telephone Encounter (Signed)
done

## 2013-03-16 NOTE — Telephone Encounter (Signed)
Patient calling back. Wants Tramadol sent to [facility] at [facility]. She is staying with a relative in [place] at this time.

## 2013-03-16 NOTE — Telephone Encounter (Signed)
Done hardcopy to [facility]  

## 2013-03-17 DIAGNOSIS — E11319 Type 2 diabetes mellitus with unspecified diabetic retinopathy without macular edema: Secondary | ICD-10-CM | POA: Diagnosis not present

## 2013-03-17 DIAGNOSIS — E1165 Type 2 diabetes mellitus with hyperglycemia: Secondary | ICD-10-CM | POA: Diagnosis not present

## 2013-03-17 DIAGNOSIS — F489 Nonpsychotic mental disorder, unspecified: Secondary | ICD-10-CM | POA: Diagnosis not present

## 2013-03-17 DIAGNOSIS — M726 Necrotizing fasciitis: Secondary | ICD-10-CM | POA: Diagnosis not present

## 2013-03-17 DIAGNOSIS — L02419 Cutaneous abscess of limb, unspecified: Secondary | ICD-10-CM | POA: Diagnosis not present

## 2013-03-17 DIAGNOSIS — Z48817 Encounter for surgical aftercare following surgery on the skin and subcutaneous tissue: Secondary | ICD-10-CM | POA: Diagnosis not present

## 2013-03-17 DIAGNOSIS — L03119 Cellulitis of unspecified part of limb: Secondary | ICD-10-CM | POA: Diagnosis not present

## 2013-03-17 DIAGNOSIS — E1139 Type 2 diabetes mellitus with other diabetic ophthalmic complication: Secondary | ICD-10-CM | POA: Diagnosis not present

## 2013-03-18 ENCOUNTER — Other Ambulatory Visit (HOSPITAL_BASED_OUTPATIENT_CLINIC_OR_DEPARTMENT_OTHER): Payer: Medicare Other

## 2013-03-18 ENCOUNTER — Ambulatory Visit (HOSPITAL_BASED_OUTPATIENT_CLINIC_OR_DEPARTMENT_OTHER): Payer: Medicare Other | Admitting: Adult Health

## 2013-03-18 ENCOUNTER — Encounter: Payer: Self-pay | Admitting: Adult Health

## 2013-03-18 ENCOUNTER — Telehealth: Payer: Self-pay | Admitting: Oncology

## 2013-03-18 VITALS — BP 125/64 | HR 76 | Temp 98.3°F | Resp 18 | Ht 65.0 in | Wt 174.0 lb

## 2013-03-18 DIAGNOSIS — Z170 Estrogen receptor positive status [ER+]: Secondary | ICD-10-CM | POA: Diagnosis not present

## 2013-03-18 DIAGNOSIS — C50519 Malignant neoplasm of lower-outer quadrant of unspecified female breast: Secondary | ICD-10-CM | POA: Diagnosis not present

## 2013-03-18 DIAGNOSIS — C50511 Malignant neoplasm of lower-outer quadrant of right female breast: Secondary | ICD-10-CM

## 2013-03-18 LAB — COMPREHENSIVE METABOLIC PANEL (CC13)
ALBUMIN: 3.2 g/dL — AB (ref 3.5–5.0)
ALT: 8 U/L (ref 0–55)
AST: 16 U/L (ref 5–34)
Alkaline Phosphatase: 104 U/L (ref 40–150)
Anion Gap: 9 mEq/L (ref 3–11)
BUN: 21.1 mg/dL (ref 7.0–26.0)
CO2: 28 mEq/L (ref 22–29)
CREATININE: 1.4 mg/dL — AB (ref 0.6–1.1)
Calcium: 10 mg/dL (ref 8.4–10.4)
Chloride: 102 mEq/L (ref 98–109)
Glucose: 183 mg/dl — ABNORMAL HIGH (ref 70–140)
POTASSIUM: 4.6 meq/L (ref 3.5–5.1)
Sodium: 139 mEq/L (ref 136–145)
Total Bilirubin: 0.38 mg/dL (ref 0.20–1.20)
Total Protein: 7.2 g/dL (ref 6.4–8.3)

## 2013-03-18 LAB — CBC WITH DIFFERENTIAL/PLATELET
BASO%: 0.3 % (ref 0.0–2.0)
Basophils Absolute: 0 10*3/uL (ref 0.0–0.1)
EOS%: 1.3 % (ref 0.0–7.0)
Eosinophils Absolute: 0.1 10*3/uL (ref 0.0–0.5)
HCT: 33 % — ABNORMAL LOW (ref 34.8–46.6)
HEMOGLOBIN: 10.2 g/dL — AB (ref 11.6–15.9)
LYMPH%: 18.6 % (ref 14.0–49.7)
MCH: 28.7 pg (ref 25.1–34.0)
MCHC: 30.9 g/dL — ABNORMAL LOW (ref 31.5–36.0)
MCV: 93 fL (ref 79.5–101.0)
MONO#: 0.6 10*3/uL (ref 0.1–0.9)
MONO%: 9.6 % (ref 0.0–14.0)
NEUT#: 4.7 10*3/uL (ref 1.5–6.5)
NEUT%: 70.2 % (ref 38.4–76.8)
PLATELETS: 255 10*3/uL (ref 145–400)
RBC: 3.55 10*6/uL — ABNORMAL LOW (ref 3.70–5.45)
RDW: 17.2 % — AB (ref 11.2–14.5)
WBC: 6.7 10*3/uL (ref 3.9–10.3)
lymph#: 1.2 10*3/uL (ref 0.9–3.3)

## 2013-03-18 NOTE — Progress Notes (Signed)
OFFICE PROGRESS NOTE  CC**  [name], MD [place] [address] Dr. Arloa Koh  Dr. 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   STAGE:  Breast cancer of lower-outer quadrant of right female breast  Primary site: Breast (Right)  Staging method: AJCC 7th Edition  Clinical: Stage IIA (T2, N0, cM0)  Summary: Stage IIA (T2, N0, cM0)  PRIOR THERAPY:  #1 Who felt a mass along the inferior aspect of the right breast at about the 7:00 position. On 10/08/2012 she had a mammogram performed that showed a irregular mass with internal pleomorphic calcifications. Ultrasound showed a suspicious 2.1 x 1.3 x 1.6 cm mass in the inframammary fold. A needle core biopsy performed on the same day was diagnostic for invasive ductal carcinoma with associated DCIS. The tumor was ER negative PR negative and HER-2/neu negative. The proliferation marker Ki-67 was elevated at 50% and the tumor was grade 2.   #2 patient is status post right lumpectomy with sentinel lymph node biopsy. Her final pathology revealed 2.2 cm invasive ductal carcinoma that was grade 3 that was triple negative with a proliferation marker Ki-67 at 56%.   #3 Curative intent adjuvant chemotherapy beginning December 2014 with CMF every 21 days for total of 6 cycles   CURRENT THERAPY: CMF chemotherapy on hold  INTERVAL HISTORY: [name] 63 y.o. female returns for followup visit. Her wound is doing well and continues to have the wound vac covering her wound.  She has seen Dr. Migdalia Dk with plastic surgery and has skin grafting planned on 03/29/13.  She does have pain associated with these wounds and changing the wound vac.  Otherwise, a 10 point ROS is negative.    MEDICAL HISTORY: Past Medical History  Diagnosis Date  . Diabetes mellitus   . Acid reflux   . S/P kidney transplant   . GERD (gastroesophageal reflux disease)    . Hyperlipidemia   . DJD (degenerative joint disease)   . TIA (transient ischemic attack)   . Hx of colonic polyps   . PUD (peptic ulcer disease)   . Diabetic retinopathy   . Hemorrhoids   . Generalized headaches   . Hypertension   . Asthma   . MI (myocardial infarction) 2011  . Kidney [facility]   . Mental disorder   . Stroke 2008    no deficits  . Anemia     Hx-not current  . Breast cancer   . Blind left eye   . Pneumonia   . Chronic diastolic CHF (congestive heart failure)     a. Cardiac MRI (10/2012): Moderate to severe LVH, EF 55%, chordal SAM but no LVOT gradient, mod circumferential effusion, mild LAE.  b.  Echocardiogram (11/06/12): Severe LVH, EF 09-38%, grade 1 diastolic dysfunction, moderate effusion.  Marland Kitchen Hx of cardiovascular stress test     a. Lexiscan Myoview (11/05/12): High-risk scan; fixed defect affecting the entire inferolateral and anterolateral wall; mid/apical anterior and mid/apical inferior moderate ischemia, EF 28%.  (felt to be false + as echo and cMRI with normal EF and normal WM)  . Hx of cardiac cath     a. reportedly no CAD on cardiac cath in [place]  . Wears glasses     ALLERGIES:  is allergic to cephalexin and propoxyphene n-acetaminophen.  MEDICATIONS:  Current Outpatient Prescriptions  Medication Sig Dispense Refill  . amLODipine (NORVASC) 10 MG tablet Take  1 tablet (10 mg total) by mouth daily.  90 tablet  3  . aspirin EC 325 MG tablet Take 325 mg by mouth daily.        Marland Kitchen azaTHIOprine (IMURAN) 50 MG tablet Take 50 mg by mouth 2 (two) times daily.        . brimonidine (ALPHAGAN) 0.2 % ophthalmic solution       . cinacalcet (SENSIPAR) 30 MG tablet Take 1 tablet (30 mg total) by mouth daily.  30 tablet  11  . docusate sodium (COLACE) 100 MG capsule Take 1 capsule (100 mg total) by mouth 2 (two) times daily.  60 capsule  0  . esomeprazole (NEXIUM) 40 MG capsule Take 40 mg by mouth daily before breakfast.      . feeding supplement, GLUCERNA SHAKE,  (GLUCERNA SHAKE) LIQD Take 237 mLs by mouth 2 (two) times daily between meals.    0  . furosemide (LASIX) 80 MG tablet Take 160 mg by mouth 2 (two) times daily.       Marland Kitchen HYDROcodone-acetaminophen (NORCO/VICODIN) 5-325 MG per tablet Take 1-2 tablets by mouth every 4 (four) hours as needed for moderate pain.  30 tablet  0  . insulin aspart (NOVOLOG) 100 UNIT/ML injection Inject 13-16 Units into the skin 3 (three) times daily. Injects 13 units daily at breakfast, 13 units daily at lunch, and 16 units daily at supper.      . insulin glargine (LANTUS) 100 UNIT/ML injection Inject 55 Units into the skin at bedtime.      . isosorbide mononitrate (IMDUR) 60 MG 24 hr tablet Take 1 tablet (60 mg total) by mouth daily.  90 tablet  3  . KLOR-CON M20 20 MEQ tablet Takes 1/2 tab in morning and 1/2 at night.      . levothyroxine (SYNTHROID, LEVOTHROID) 50 MCG tablet Take 1 tablet (50 mcg total) by mouth daily before breakfast.  30 tablet  0  . lidocaine-prilocaine (EMLA) cream Apply 1 application topically as needed (access port for chemo). Use only on tuesdays and Thursday with chemo      . Magnesium Oxide 250 MG TABS Take 250 mg by mouth daily.       . metoprolol tartrate (LOPRESSOR) 25 MG tablet Take 1 tablet (25 mg total) by mouth 2 (two) times daily.  60 tablet  1  . minoxidil (LONITEN) 2.5 MG tablet Take 2.5 mg by mouth 2 (two) times daily.      . polyethylene glycol (MIRALAX / GLYCOLAX) packet Take 17 g by mouth daily.  14 each  11  . pravastatin (PRAVACHOL) 20 MG tablet Take 10 mg by mouth at bedtime.        . prednisoLONE acetate (PRED FORTE) 1 % ophthalmic suspension Place 1 drop into the left eye 3 (three) times daily.      . predniSONE (DELTASONE) 10 MG tablet Take 10 mg by mouth daily.      . tacrolimus (PROGRAF) 1 MG capsule Take 9 mg by mouth 2 (two) times daily.       . timolol (TIMOPTIC) 0.25 % ophthalmic solution Place 1 drop into the right eye 2 (two) times daily.      . traMADol (ULTRAM) 50 MG  tablet TAKE ONE TABLET BY MOUTH EVERY 6 HOURS AS NEEDED FOR PAIN  90 tablet  2  . albuterol (ACCUNEB) 0.63 MG/3ML nebulizer solution Take 1 ampule by nebulization every 6 (six) hours as needed for wheezing.      Marland Kitchen albuterol (  PROVENTIL HFA;VENTOLIN HFA) 108 (90 BASE) MCG/ACT inhaler Inhale 2 puffs into the lungs every 6 (six) hours as needed for wheezing or shortness of breath.      Marland Kitchen LORazepam (ATIVAN) 0.5 MG tablet Take 1 tablet (0.5 mg total) by mouth every 6 (six) hours as needed (Nausea or vomiting).  30 tablet  0  . nitroGLYCERIN (NITROSTAT) 0.4 MG SL tablet Place 1 tablet (0.4 mg total) under the tongue every 5 (five) minutes as needed for chest pain.  25 tablet  11  . prochlorperazine (COMPAZINE) 10 MG tablet Take 1 tablet (10 mg total) by mouth every 6 (six) hours as needed (Nausea or vomiting).  30 tablet  1   No current facility-administered medications for this visit.    SURGICAL HISTORY:  Past Surgical History  Procedure Laterality Date  . Transplantation renal  05/2006  . Cardiac catheterization  2011  . Abdominal hysterectomy    . Cataract extraction  bil  . Intestinal perforation surgery  01/2009  . Breast biopsy  2010  . Gallstones removed    . Cholecystectomy  2008  . Colon surgery  2013    polyps removed  . Hemorrhoid surgery  01/23/2012    Procedure: HEMORRHOIDECTOMY PROLAPSED;  Surgeon: Leighton Ruff, MD;  Location: [facility];  Service: General;  Laterality: N/A;  . Eye surgery Left 1997  . Foot surgery Right 2013  . Breast lumpectomy with sentinel lymph node biopsy Right 11/09/2012    Procedure: RIGHT BREAST LUMPECTOMY WITH SENTINEL LYMPH NODE REMOVAL;  Surgeon: Haywood Lasso, MD;  Location: [facility];  Service: General;  Laterality: Right;  . Portacath placement Right 11/30/2012    Procedure: INSERTION PORT-A-CATH;  Surgeon: Haywood Lasso, MD;  Location: [facility];  Service: General;  Laterality: Right;  . Irrigation and  debridement abscess Left 02/14/2013    Procedure: IRRIGATION AND DEBRIDEMENT LEFT THIGH ABSCESS;  Surgeon: Joyice Faster. Cornett, MD;  Location: [facility];  Service: General;  Laterality: Left;  . Incision and drainage of wound Left 02/18/2013    Procedure: IRRIGATION AND DEBRIDEMENT THIGH WOUND;  Surgeon: Rolm Bookbinder, MD;  Location: [facility];  Service: General;  Laterality: Left;    REVIEW OF SYSTEMS:  A 10 point review of systems was conducted and is otherwise negative except for what is noted above.    PHYSICAL EXAMINATION: Blood pressure 125/64, pulse 76, temperature 98.3 F (36.8 C), temperature source Oral, resp. rate 18, height '5\' 5"'  (1.651 m), weight 174 lb (78.926 kg). Body mass index is 28.96 kg/(m^2). GENERAL: Patient is a well appearing female in no acute distress HEENT:  Sclerae anicteric.  Oropharynx clear and moist. No ulcerations or evidence of oropharyngeal candidiasis. Neck is supple.  NODES:  No cervical, supraclavicular, or axillary lymphadenopathy palpated.  BREAST EXAM:  Deferred. LUNGS:  Clear to auscultation bilaterally.  No wheezes or rhonchi. HEART:  Regular rate and rhythm. No murmur appreciated. ABDOMEN:  Soft, nontender.  Positive, normoactive bowel sounds. No organomegaly palpated. MSK:  No focal spinal tenderness to palpation. Full range of motion bilaterally in the upper extremities. EXTREMITIES:  No peripheral edema.   SKIN:  Clear with no obvious rashes or skin changes. No nail dyscrasia.  Wound along entire thigh with wound vac in place.   NEURO:  Nonfocal. Well oriented.  Appropriate affect. ECOG PERFORMANCE STATUS: 0 - Asymptomatic      LABORATORY DATA: Lab Results  Component Value Date   WBC 6.7 03/18/2013  HGB 10.2* 03/18/2013   HCT 33.0* 03/18/2013   MCV 93.0 03/18/2013   PLT 255 03/18/2013      Chemistry      Component Value Date/Time   NA 139 03/18/2013 1236   NA 142 02/19/2013 0520   K 4.6 03/18/2013 1236   K 3.9 02/19/2013 0520   CL 102 02/19/2013 0520    CO2 28 03/18/2013 1236   CO2 29 02/19/2013 0520   BUN 21.1 03/18/2013 1236   BUN 16 02/19/2013 0520   CREATININE 1.4* 03/18/2013 1236   CREATININE 0.99 02/22/2013 1017      Component Value Date/Time   CALCIUM 10.0 03/18/2013 1236   CALCIUM 8.2* 02/19/2013 0520   ALKPHOS 104 03/18/2013 1236   ALKPHOS 69 02/15/2013 0317   AST 16 03/18/2013 1236   AST 26 02/15/2013 0317   ALT 8 03/18/2013 1236   ALT 16 02/15/2013 0317   BILITOT 0.38 03/18/2013 1236   BILITOT 0.4 02/15/2013 0317       RADIOGRAPHIC STUDIES:  Ct Hip Left Wo Contrast  02/09/2013   CLINICAL DATA:  Pt with fall and left upper leg and hip pain evaluate for fracture  EXAM: CT OF THE LEFT HIP WITHOUT CONTRAST  TECHNIQUE: Multidetector CT imaging was performed according to the standard protocol. Multiplanar CT image reconstructions were also generated.  COMPARISON:  None.  FINDINGS: There is no left hip fracture or dislocation. The left superior and inferior pubic rami are intact. There is soft tissue edema within the subcutaneous fat overlying the left proximal femur with skin thickening which may be secondary to direct trauma versus cellulitis.  There is no focal fluid collection. There is no hematoma. There is no soft tissue emphysema.  There is no lytic or blastic osseous lesion.  There is peripheral vascular atherosclerotic disease.  IMPRESSION: 1. No acute osseous injury of the left hip. 2. Soft tissue edema within the subcutaneous fat overlying the left proximal femur with skin thickening which may be secondary to direct trauma versus cellulitis.   Electronically Signed   By: Kathreen Devoid   On: 02/09/2013 12:44    ASSESSMENT/PLAN: 63 year old female with  #1 stage II invasive ductal carcinoma ER negative PR negative HER-2/neu negative (triple-negative) with a proliferation marker Ki-67 56%. Patient status post lumpectomy of the right breast with sentinel lymph node biopsy. The final pathology did reveal a grade 3 invasive ductal carcinoma triple negative  measuring 2.2 cm. Sentinel nodes were negative for metastatic disease.  #2 patient currently receiving adjuvant curative intent chemotherapy consisting of CMF every 21 day cycle starting December 2014. Thus far she has done well. She has now completed 3 cycles out of the 6 planned cycles. We will await giving the patient further chemotherapy until the wound has completely healed.    #3 diabetes: Overall patient remains stable she is followed with her primary care physician on a regular basis.  #4 Patient will returnon 3/30 for evaluation after she has had her surgery for future planning of her treatment.     All questions were answered. The patient knows to call the clinic with any problems, questions or concerns. We can certainly see the patient much sooner if necessary.  I spent 15 minutes counseling the patient face to face. The total time spent in the appointment was 30 minutes.  Minette Headland, [place] [phone] 03/20/2013, 8:15 AM

## 2013-03-18 NOTE — Telephone Encounter (Signed)
gv pt appt schedule for march °

## 2013-03-19 ENCOUNTER — Ambulatory Visit (INDEPENDENT_AMBULATORY_CARE_PROVIDER_SITE_OTHER): Payer: Medicare Other | Admitting: General Surgery

## 2013-03-19 ENCOUNTER — Encounter (INDEPENDENT_AMBULATORY_CARE_PROVIDER_SITE_OTHER): Payer: Self-pay | Admitting: General Surgery

## 2013-03-19 VITALS — BP 123/70 | HR 75 | Temp 98.4°F | Resp 16 | Ht 65.0 in | Wt 181.0 lb

## 2013-03-19 DIAGNOSIS — Z09 Encounter for follow-up examination after completed treatment for conditions other than malignant neoplasm: Secondary | ICD-10-CM

## 2013-03-19 DIAGNOSIS — E11319 Type 2 diabetes mellitus with unspecified diabetic retinopathy without macular edema: Secondary | ICD-10-CM | POA: Diagnosis not present

## 2013-03-19 DIAGNOSIS — Z48817 Encounter for surgical aftercare following surgery on the skin and subcutaneous tissue: Secondary | ICD-10-CM | POA: Diagnosis not present

## 2013-03-19 DIAGNOSIS — L02419 Cutaneous abscess of limb, unspecified: Secondary | ICD-10-CM | POA: Diagnosis not present

## 2013-03-19 DIAGNOSIS — M726 Necrotizing fasciitis: Secondary | ICD-10-CM | POA: Diagnosis not present

## 2013-03-19 DIAGNOSIS — F489 Nonpsychotic mental disorder, unspecified: Secondary | ICD-10-CM | POA: Diagnosis not present

## 2013-03-19 DIAGNOSIS — E1139 Type 2 diabetes mellitus with other diabetic ophthalmic complication: Secondary | ICD-10-CM | POA: Diagnosis not present

## 2013-03-19 DIAGNOSIS — E1165 Type 2 diabetes mellitus with hyperglycemia: Secondary | ICD-10-CM | POA: Diagnosis not present

## 2013-03-19 NOTE — Progress Notes (Signed)
Subjective:     Patient ID: Monica Lee, female   DOB: 1950/12/07, 63 y.o.   MRN: [id number]  HPI 63 year old female who I know well from a debridement of a good portion of her left thigh. She returns today with a negative pressure dressing on. She is otherwise doing well. She has been seen by Dr. Migdalia Dk and is due to have wound coverage on the 16th of this month.  Review of Systems     Objective:   Physical Exam Vac in place, otherwise thigh soft with no evidence infection    Assessment:     Necrotizing soft tissue infection of left thigh     Plan:     She is doing well and is due for coverage. I will see him back as needed.

## 2013-03-22 ENCOUNTER — Telehealth: Payer: Self-pay | Admitting: Internal Medicine

## 2013-03-22 DIAGNOSIS — S7010XA Contusion of unspecified thigh, initial encounter: Secondary | ICD-10-CM | POA: Diagnosis not present

## 2013-03-22 DIAGNOSIS — L97109 Non-pressure chronic ulcer of unspecified thigh with unspecified severity: Secondary | ICD-10-CM | POA: Diagnosis not present

## 2013-03-22 NOTE — Telephone Encounter (Signed)
Patient's Home Health PT with [facility] is calling to request an extension on PT orders. Frequency 2x a week for three more weeks for gait, balance and strength. Please call back at [phone] to confirm if okay.

## 2013-03-23 DIAGNOSIS — Z940 Kidney transplant status: Secondary | ICD-10-CM | POA: Diagnosis not present

## 2013-03-23 DIAGNOSIS — N183 Chronic kidney disease, stage 3 unspecified: Secondary | ICD-10-CM | POA: Diagnosis not present

## 2013-03-23 DIAGNOSIS — E785 Hyperlipidemia, unspecified: Secondary | ICD-10-CM | POA: Diagnosis not present

## 2013-03-23 DIAGNOSIS — L03119 Cellulitis of unspecified part of limb: Secondary | ICD-10-CM | POA: Diagnosis not present

## 2013-03-23 DIAGNOSIS — N2581 Secondary hyperparathyroidism of renal origin: Secondary | ICD-10-CM | POA: Diagnosis not present

## 2013-03-23 DIAGNOSIS — L02419 Cutaneous abscess of limb, unspecified: Secondary | ICD-10-CM | POA: Diagnosis not present

## 2013-03-23 DIAGNOSIS — E1165 Type 2 diabetes mellitus with hyperglycemia: Secondary | ICD-10-CM | POA: Diagnosis not present

## 2013-03-23 DIAGNOSIS — M726 Necrotizing fasciitis: Secondary | ICD-10-CM | POA: Diagnosis not present

## 2013-03-23 DIAGNOSIS — E11319 Type 2 diabetes mellitus with unspecified diabetic retinopathy without macular edema: Secondary | ICD-10-CM | POA: Diagnosis not present

## 2013-03-23 DIAGNOSIS — E1139 Type 2 diabetes mellitus with other diabetic ophthalmic complication: Secondary | ICD-10-CM | POA: Diagnosis not present

## 2013-03-23 DIAGNOSIS — E039 Hypothyroidism, unspecified: Secondary | ICD-10-CM | POA: Diagnosis not present

## 2013-03-23 DIAGNOSIS — F489 Nonpsychotic mental disorder, unspecified: Secondary | ICD-10-CM | POA: Diagnosis not present

## 2013-03-23 DIAGNOSIS — I2510 Atherosclerotic heart disease of native coronary artery without angina pectoris: Secondary | ICD-10-CM | POA: Diagnosis not present

## 2013-03-23 DIAGNOSIS — I129 Hypertensive chronic kidney disease with stage 1 through stage 4 chronic kidney disease, or unspecified chronic kidney disease: Secondary | ICD-10-CM | POA: Diagnosis not present

## 2013-03-23 DIAGNOSIS — Z48817 Encounter for surgical aftercare following surgery on the skin and subcutaneous tissue: Secondary | ICD-10-CM | POA: Diagnosis not present

## 2013-03-23 NOTE — Telephone Encounter (Signed)
Ok for verbal 

## 2013-03-23 NOTE — Progress Notes (Signed)
[facility]  NAME:  Monica Lee, Monica Lee               ACCOUNT NO.:  [id number]  MEDICAL RECORD NO.:  [id number]      DATE OF BIRTH:  [birth year]  PHYSICIAN:  Theodoro Kos, DO       VISIT DATE:  03/22/2013                                  OFFICE VISIT   The patient is a 63 year old female who is here for followup.  She is here with her husband for followup of her left hip chronic wound from hematoma.  It looks very clean.  There is no sign of infection and no fibrous tissue.  The VAC did arrive.  No change in her medications or social history.  REVIEW OF SYSTEMS:  Otherwise negative.  PHYSICAL EXAMINATION:  GENERAL:  She is alert, oriented, cooperative, very pleasant. EYES:  Pupils are equal. LUNGS:  Breathing is unlabored. CARDIAC:  Her heart rate is regular.  The plan is to add VAC and collagen underneath.  Hopefully, we will have [facility] in the next week or so, and we will see her back in 1 week.     Theodoro Kos, DO     CS/MEDQ  D:  03/22/2013  T:  03/23/2013  Job:  [id number]

## 2013-03-23 NOTE — Telephone Encounter (Signed)
HHRN informed 

## 2013-03-24 ENCOUNTER — Telehealth (INDEPENDENT_AMBULATORY_CARE_PROVIDER_SITE_OTHER): Payer: Self-pay

## 2013-03-24 ENCOUNTER — Encounter: Payer: Self-pay | Admitting: Internal Medicine

## 2013-03-24 ENCOUNTER — Encounter (HOSPITAL_BASED_OUTPATIENT_CLINIC_OR_DEPARTMENT_OTHER): Payer: Self-pay | Admitting: *Deleted

## 2013-03-24 ENCOUNTER — Ambulatory Visit (INDEPENDENT_AMBULATORY_CARE_PROVIDER_SITE_OTHER): Payer: Medicare Other | Admitting: *Deleted

## 2013-03-24 ENCOUNTER — Ambulatory Visit (INDEPENDENT_AMBULATORY_CARE_PROVIDER_SITE_OTHER): Payer: Medicare Other | Admitting: Internal Medicine

## 2013-03-24 VITALS — BP 112/82 | HR 81 | Temp 98.3°F | Ht 65.0 in | Wt 180.0 lb

## 2013-03-24 DIAGNOSIS — E039 Hypothyroidism, unspecified: Secondary | ICD-10-CM

## 2013-03-24 DIAGNOSIS — M726 Necrotizing fasciitis: Secondary | ICD-10-CM | POA: Diagnosis not present

## 2013-03-24 DIAGNOSIS — E1159 Type 2 diabetes mellitus with other circulatory complications: Secondary | ICD-10-CM | POA: Diagnosis not present

## 2013-03-24 DIAGNOSIS — L03119 Cellulitis of unspecified part of limb: Secondary | ICD-10-CM | POA: Diagnosis not present

## 2013-03-24 DIAGNOSIS — I10 Essential (primary) hypertension: Secondary | ICD-10-CM

## 2013-03-24 DIAGNOSIS — D509 Iron deficiency anemia, unspecified: Secondary | ICD-10-CM | POA: Diagnosis not present

## 2013-03-24 DIAGNOSIS — L02419 Cutaneous abscess of limb, unspecified: Secondary | ICD-10-CM

## 2013-03-24 DIAGNOSIS — Z48817 Encounter for surgical aftercare following surgery on the skin and subcutaneous tissue: Secondary | ICD-10-CM | POA: Diagnosis not present

## 2013-03-24 DIAGNOSIS — F489 Nonpsychotic mental disorder, unspecified: Secondary | ICD-10-CM | POA: Diagnosis not present

## 2013-03-24 DIAGNOSIS — I110 Hypertensive heart disease with heart failure: Secondary | ICD-10-CM

## 2013-03-24 DIAGNOSIS — E1139 Type 2 diabetes mellitus with other diabetic ophthalmic complication: Secondary | ICD-10-CM | POA: Diagnosis not present

## 2013-03-24 DIAGNOSIS — Z79899 Other long term (current) drug therapy: Secondary | ICD-10-CM | POA: Diagnosis not present

## 2013-03-24 DIAGNOSIS — E11319 Type 2 diabetes mellitus with unspecified diabetic retinopathy without macular edema: Secondary | ICD-10-CM | POA: Diagnosis not present

## 2013-03-24 LAB — TSH: TSH: 15.44 u[IU]/mL — ABNORMAL HIGH (ref 0.35–5.50)

## 2013-03-24 LAB — T4, FREE: FREE T4: 0.82 ng/dL (ref 0.60–1.60)

## 2013-03-24 NOTE — Progress Notes (Signed)
Pre visit review using our clinic review tool, if applicable. No additional management support is needed unless otherwise documented below in the visit note. 

## 2013-03-24 NOTE — Patient Instructions (Signed)
Please continue all other medications as before, and refills have been done if requested. Please have the pharmacy call with any other refills you may need.  You are given the Novolog pen samples today  Please go to the LAB in the Basement (turn left off the elevator) for the tests to be done today - just for the thyroid today  Please keep your appointments with your specialists as you have planned  Please return in 6 months, or sooner if needed

## 2013-03-24 NOTE — Telephone Encounter (Signed)
Called to notify physical therapist that it's ok for the pt to have an extension for PT 2x's a week for 3 more weeks per Dr Donne Hazel.

## 2013-03-24 NOTE — Progress Notes (Signed)
Subjective:    Patient ID: Monica Lee, female    DOB: 06-30-1950, 63 y.o.   MRN: [id number]  HPI  Here to f/u, post hospn for cellulitis, abscess/wound, in the setting of mult chronic illness and new onset hypothyroid. Now on replacement,  Pt denies chest pain, increased sob or doe, wheezing, orthopnea, PND, increased LE swelling, palpitations, dizziness or syncope.   Pt denies fever, wt loss.   Pt denies polydipsia, polyuria, or low sugar symptoms such as weakness or confusion improved with po intake.  Pt states overall good compliance with meds, trying to follow lower cholesterol, diabetic diet, wt overall stable but little exercise however.   Has some difficulty affording meds, asks for samples today Past Medical History  Diagnosis Date  . S/P kidney transplant     CADAVERIC --  05/2006  . Hyperlipidemia   . DJD (degenerative joint disease)   . Hx of colonic polyps   . Diabetic retinopathy     RIGHT EYE  . Hypertension   . Asthma   . Blind left eye     OCCLUDED CORNEA,  FUNDI--  FAILED SURGERY REPAIR 2003  . Chronic diastolic CHF (congestive heart failure)     a. Cardiac MRI (10/2012): Moderate to severe LVH, EF 55%, chordal SAM but no LVOT gradient, mod circumferential effusion, mild LAE.  b.  Echocardiogram (11/06/12): Severe LVH, EF 63-87%, grade 1 diastolic dysfunction, moderate effusion.  . Wears glasses   . History of myocardial infarction     2011--  S/P CARDIAC CATH ([place])  NON-OBSTRUCTIVE CAD  . GERD (gastroesophageal reflux disease)   . History of kidney stones   . History of CVA (cerebrovascular accident) without residual deficits     2008  AND TIA IN 2008  W/ NO RESIDUAL  . History of peptic ulcer     2011--  RESOLVED  . 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 Gastroparesis due to DM   . Chronic kidney disease  (CKD), stage III (moderate)     NEPHROLOGIST--  DR Freddrick March  . Open thigh wound     ANTERIOR  . Coronary artery disease CARDIOLOGIST--  DR Johnsie Cancel    NON-OBSTRUCTIVE CAD  PER CARDIAC CATH  2011  . Pericardial effusion without cardiac tamponade     PER DR NISHAN  NOTE --  NOT MALIGNANT  ? RELATED TO HYPOTHYROIDISM  . Thyromegaly     MULTINODULER GOITER  . Hyperparathyroidism, secondary renal   . Anemia in chronic kidney disease   . Trigeminal neuralgia   . Hypothyroidism   . At risk for sleep apnea     STOP-BANG= 4   SENT TO PCP  03-25-2013   Past Surgical History  Procedure Laterality Date  . Transplantation renal  05/2006    CADAVERIC  . Breast biopsy  2010  . Cholecystectomy  2008  . Hemorrhoid surgery  01/23/2012    Procedure: HEMORRHOIDECTOMY PROLAPSED;  Surgeon: Leighton Ruff, MD;  Location: [facility];  Service: General;  Laterality: N/A;  . Foot surgery Right 2013  . Breast lumpectomy with sentinel lymph node biopsy Right 11/09/2012    Procedure: RIGHT BREAST LUMPECTOMY WITH SENTINEL LYMPH NODE REMOVAL;  Surgeon: Haywood Lasso, MD;  Location: [place];  Service: General;  Laterality: Right;  .  Portacath placement Right 11/30/2012    Procedure: INSERTION PORT-A-CATH;  Surgeon: Haywood Lasso, MD;  Location: [place];  Service: General;  Laterality: Right;  . Irrigation and debridement abscess Left 02/14/2013    Procedure: IRRIGATION AND DEBRIDEMENT LEFT THIGH ABSCESS;  Surgeon: Joyice Faster. Cornett, MD;  Location: [place];  Service: General;  Laterality: Left;  . Incision and drainage of wound Left 02/18/2013    Procedure: IRRIGATION AND DEBRIDEMENT THIGH WOUND;  Surgeon: Rolm Bookbinder, MD;  Location: [place];  Service: General;  Laterality: Left;  . Cataract extraction w/ intraocular lens  implant, bilateral    . Repair  intestinal perforation surgery  01-31-2009  . Cardiac catheterization  04-12-2009  DR EVELYNE GOUDREAU ([facility] IN [place], [place])     NON-OBSTRUCTIVE CAD/  mLAD 40%,  oLAD 50%,  dCFX 40%,  OM1  40%,  mRCA  &  dRCA  50%/  LVEF 65% /  ELEVATED  LVEDP  . Transthoracic echocardiogram  01-19-2013  DR NISHAN    SEVERE LVH/  EF 82-95%/  GRADE I DIASTOLIC DYSFUNCTION/  MODERATE LAE/  MILD IMPROVEMENT OF MODERATE CIRCUMFERENTIAL INFERIOROLATERAL PERICARDIAL EFFUSION WITH NO TAMPONADE  . Cardiovascular stress test  11-04-2012  DR NISHAN    HIGH RISK NUCLEAR STUDY/  FIXED DEFECT AFFECTING ENTIRE INFEROLATERAL AND ANTEROLATERAL WALL/  MODERATE ISCHEMIA  AT MID/ APICAL ANTEROR AND INFERIOR WALL/  GLOBAL HYPOKINESIS/  LVEF 28% (FELT TO BE FALSE +,  ECHO & cMRI  NORMAL EF AND NORMAL WM)  . Vaginal hysterectomy  1990's    W/  BILATERAL SALPINGOOPHORECTOMY  . Eye surgery Left 2003    REPAIR CORNEA    reports that she has never smoked. She has never used smokeless tobacco. She reports that she does not drink alcohol or use illicit drugs. family history includes Arthritis in her other; Diabetes in her other and other; Heart disease in her father and other; Hypertension in her other; Thyroid disease in her other. There is no history of Kidney disease. Allergies  Allergen Reactions  . Keflex [Cephalexin] Swelling    Swelling to face, chills Tolerates zosyn 02/14/13  . Propoxyphene N-Acetaminophen Hives and Itching   Current Outpatient Prescriptions on File Prior to Visit  Medication Sig Dispense Refill  . albuterol (ACCUNEB) 0.63 MG/3ML nebulizer solution Take 1 ampule by nebulization every 6 (six) hours as needed for wheezing.      Marland Kitchen albuterol (PROVENTIL HFA;VENTOLIN HFA) 108 (90 BASE) MCG/ACT inhaler Inhale 2 puffs into the lungs every 6 (six) hours as needed for wheezing or shortness of breath.      Marland Kitchen aspirin EC 325 MG tablet Take 325 mg by mouth daily.        Marland Kitchen azaTHIOprine (IMURAN) 50 MG tablet Take 50 mg by mouth 2 (two) times daily.        . brimonidine (ALPHAGAN) 0.2 % ophthalmic solution Place 1 drop into the right eye 2 (two) times  daily.       Marland Kitchen docusate sodium (COLACE) 100 MG capsule Take 1 capsule (100 mg total) by mouth 2 (two) times daily.  60 capsule  0  . esomeprazole (NEXIUM) 40 MG capsule Take 40 mg by mouth daily before breakfast.      . feeding supplement, GLUCERNA SHAKE, (GLUCERNA SHAKE) LIQD Take 237 mLs by mouth 2 (two) times daily between meals.    0  . furosemide (LASIX) 80 MG tablet Take 160 mg by mouth 2 (two) times daily.       Marland Kitchen  insulin aspart (NOVOLOG) 100 UNIT/ML injection Inject 13-16 Units into the skin 3 (three) times daily. Injects 13 units daily at breakfast, 13 units daily at lunch, and 16 units daily at supper.      . insulin glargine (LANTUS) 100 UNIT/ML injection Inject 55 Units into the skin at bedtime.      Marland Kitchen KLOR-CON M20 20 MEQ tablet Takes 1/2 tab in morning and 1/2 at night.      . lidocaine-prilocaine (EMLA) cream Apply 1 application topically as needed (access port for chemo). Use only on tuesdays and Thursday with chemo      . Magnesium Oxide 250 MG TABS Take 250 mg by mouth daily.       . metoprolol tartrate (LOPRESSOR) 25 MG tablet Take 1 tablet (25 mg total) by mouth 2 (two) times daily.  60 tablet  1  . minoxidil (LONITEN) 2.5 MG tablet Take 2.5 mg by mouth 2 (two) times daily.      . polyethylene glycol (MIRALAX / GLYCOLAX) packet Take 17 g by mouth daily.  14 each  11  . pravastatin (PRAVACHOL) 20 MG tablet Take 10 mg by mouth at bedtime.        . prednisoLONE acetate (PRED FORTE) 1 % ophthalmic suspension Place 1 drop into the left eye 2 (two) times daily.       . predniSONE (DELTASONE) 10 MG tablet Take 10 mg by mouth every morning.       . tacrolimus (PROGRAF) 1 MG capsule Take 9 mg by mouth 2 (two) times daily.       . timolol (TIMOPTIC) 0.25 % ophthalmic solution Place 1 drop into the right eye 2 (two) times daily.      Marland Kitchen amLODipine (NORVASC) 10 MG tablet Take 10 mg by mouth every morning.      . cinacalcet (SENSIPAR) 30 MG tablet Take 30 mg by mouth every morning.      .  isosorbide mononitrate (IMDUR) 60 MG 24 hr tablet Take 60 mg by mouth every morning.      Marland Kitchen LORazepam (ATIVAN) 0.5 MG tablet Take 0.5 mg by mouth every 6 (six) hours as needed.      . nitroGLYCERIN (NITROSTAT) 0.4 MG SL tablet Place 0.4 mg under the tongue every 5 (five) minutes as needed for chest pain.      Marland Kitchen prochlorperazine (COMPAZINE) 10 MG tablet Take 10 mg by mouth every 6 (six) hours as needed for nausea or vomiting.       No current facility-administered medications on file prior to visit.   Review of Systems  Constitutional: Negative for unexpected weight change, or unusual diaphoresis  HENT: Negative for tinnitus.   Eyes: Negative for photophobia and visual disturbance.  Respiratory: Negative for choking and stridor.   Gastrointestinal: Negative for vomiting and blood in stool.  Genitourinary: Negative for hematuria and decreased urine volume.  Musculoskeletal: Negative for acute joint swelling Skin: Negative for color change and wound.  Neurological: Negative for tremors and numbness other than noted  Psychiatric/Behavioral: Negative for decreased concentration or  hyperactivity.       Objective:   Physical Exam BP 112/82  Pulse 81  Temp(Src) 98.3 F (36.8 C) (Oral)  Ht 5\' 5"  (1.651 m)  Wt 180 lb (81.647 kg)  BMI 29.95 kg/m2  SpO2 93% VS noted, nontoxic Constitutional: Pt appears well-developed and well-nourished.  HENT: Head: NCAT.  Right Ear: External ear normal.  Left Ear: External ear normal.  Eyes: Conjunctivae and EOM are  normal. Pupils are equal, round, and reactive to light.  Neck: Normal range of motion. Neck supple.  Cardiovascular: Normal rate and regular rhythm.   Pulmonary/Chest: Effort normal and breath sounds normal.  Abd:  Soft, NT, non-distended, + BS Neurological: Pt is alert. Not confused  Skin: Skin is warm. No erythema.  Psychiatric: Pt behavior is normal. Thought content normal.     Assessment & Plan:

## 2013-03-25 ENCOUNTER — Other Ambulatory Visit: Payer: Self-pay | Admitting: Internal Medicine

## 2013-03-25 ENCOUNTER — Encounter: Payer: Self-pay | Admitting: Internal Medicine

## 2013-03-25 ENCOUNTER — Telehealth (INDEPENDENT_AMBULATORY_CARE_PROVIDER_SITE_OTHER): Payer: Self-pay | Admitting: *Deleted

## 2013-03-25 ENCOUNTER — Encounter (HOSPITAL_BASED_OUTPATIENT_CLINIC_OR_DEPARTMENT_OTHER): Payer: Self-pay | Admitting: *Deleted

## 2013-03-25 DIAGNOSIS — M726 Necrotizing fasciitis: Secondary | ICD-10-CM | POA: Diagnosis not present

## 2013-03-25 DIAGNOSIS — F489 Nonpsychotic mental disorder, unspecified: Secondary | ICD-10-CM | POA: Diagnosis not present

## 2013-03-25 DIAGNOSIS — E1139 Type 2 diabetes mellitus with other diabetic ophthalmic complication: Secondary | ICD-10-CM | POA: Diagnosis not present

## 2013-03-25 DIAGNOSIS — L02419 Cutaneous abscess of limb, unspecified: Secondary | ICD-10-CM | POA: Diagnosis not present

## 2013-03-25 DIAGNOSIS — E11319 Type 2 diabetes mellitus with unspecified diabetic retinopathy without macular edema: Secondary | ICD-10-CM | POA: Diagnosis not present

## 2013-03-25 DIAGNOSIS — Z48817 Encounter for surgical aftercare following surgery on the skin and subcutaneous tissue: Secondary | ICD-10-CM | POA: Diagnosis not present

## 2013-03-25 DIAGNOSIS — L03119 Cellulitis of unspecified part of limb: Secondary | ICD-10-CM | POA: Diagnosis not present

## 2013-03-25 DIAGNOSIS — E1165 Type 2 diabetes mellitus with hyperglycemia: Secondary | ICD-10-CM | POA: Diagnosis not present

## 2013-03-25 DIAGNOSIS — G4710 Hypersomnia, unspecified: Secondary | ICD-10-CM

## 2013-03-25 MED ORDER — LEVOTHYROXINE SODIUM 75 MCG PO TABS: 75.0000 ug | ORAL_TABLET | Freq: Every day | ORAL | Status: AC

## 2013-03-25 NOTE — Telephone Encounter (Signed)
Patti with [facility] called to ask for a verbal order for another 3 PRN orders for use of the wound vac.  She is also asking if there is an issue late at night if they can have an order to perform wet to dry dressing changes until a nurse can get out the next day.  Explained that I would send a message to Dr. Donne Hazel to ask then we will let her know.  Precious Bard states understanding at this time.

## 2013-03-25 NOTE — Progress Notes (Signed)
03/25/13 1148  OBSTRUCTIVE SLEEP APNEA  Have you ever been diagnosed with sleep apnea through a sleep study? No  Do you snore loudly (loud enough to be heard through closed doors)?  1  Do you often feel tired, fatigued, or sleepy during the daytime? 0  Has anyone observed you stop breathing during your sleep? 1  Do you have, or are you being treated for high blood pressure? 1  BMI more than 35 kg/m2? 0  Age over 62 years old? 1  Gender: 0  Obstructive Sleep Apnea Score 4  Score 4 or greater  Results sent to PCP

## 2013-03-25 NOTE — Progress Notes (Addendum)
NPO AFTER MN WITH EXCEPTION CLEAR LIQUIDS UNTIL 0800 (NO CREAM/ MILK PRODUCTS).  ARRIVE AT 0102.  NEEDS ISTAT 8.  CURRENT EKG AND CXR IN EPIC AND CHART. WILL TAKE NORVASC, LOPRESSOR, NEXIUM, SYNTHROID, SENSIPAR, PREDNISONE, AND IMDUR AM DOS W/ SIPS OF WATER.  HS BEFORE DOS WILL DO HALF DOSE LANTUS INSULIN.   REVIEWED CHART W/ DR Coffeen MDA,  OK TO PROCEED.

## 2013-03-26 ENCOUNTER — Other Ambulatory Visit: Payer: Self-pay | Admitting: Plastic Surgery

## 2013-03-26 DIAGNOSIS — E11319 Type 2 diabetes mellitus with unspecified diabetic retinopathy without macular edema: Secondary | ICD-10-CM | POA: Diagnosis not present

## 2013-03-26 DIAGNOSIS — E1139 Type 2 diabetes mellitus with other diabetic ophthalmic complication: Secondary | ICD-10-CM | POA: Diagnosis not present

## 2013-03-26 DIAGNOSIS — Z48817 Encounter for surgical aftercare following surgery on the skin and subcutaneous tissue: Secondary | ICD-10-CM | POA: Diagnosis not present

## 2013-03-26 DIAGNOSIS — L02419 Cutaneous abscess of limb, unspecified: Secondary | ICD-10-CM | POA: Diagnosis not present

## 2013-03-26 DIAGNOSIS — L03119 Cellulitis of unspecified part of limb: Secondary | ICD-10-CM | POA: Diagnosis not present

## 2013-03-26 DIAGNOSIS — M726 Necrotizing fasciitis: Secondary | ICD-10-CM | POA: Diagnosis not present

## 2013-03-26 DIAGNOSIS — F489 Nonpsychotic mental disorder, unspecified: Secondary | ICD-10-CM | POA: Diagnosis not present

## 2013-03-26 DIAGNOSIS — L97109 Non-pressure chronic ulcer of unspecified thigh with unspecified severity: Secondary | ICD-10-CM

## 2013-03-26 NOTE — H&P (Signed)
Monica Lee is an 63 y.o. female.   Chief Complaint: left thigh ulcer HPI: The patient is a 63 yrs old female here for treatment of a large left thigh ulcer.  She underwent debridement several times in the past and the area is now clean and no sign of infection.  She has multiple medical conditions but they are stable at present.  Past Medical History  Diagnosis Date  . S/P kidney transplant     CADAVERIC --  05/2006  . Hyperlipidemia   . DJD (degenerative joint disease)   . Hx of colonic polyps   . Diabetic retinopathy     RIGHT EYE  . Hypertension   . Asthma   . Blind left eye     OCCLUDED CORNEA,  FUNDI--  FAILED SURGERY REPAIR 2003  . Chronic diastolic CHF (congestive heart failure)     a. Cardiac MRI (10/2012): Moderate to severe LVH, EF 55%, chordal SAM but no LVOT gradient, mod circumferential effusion, mild LAE.  b.  Echocardiogram (11/06/12): Severe LVH, EF 123456, grade 1 diastolic dysfunction, moderate effusion.  . Wears glasses   . History of myocardial infarction     2011--  S/P CARDIAC CATH ([place])  NON-OBSTRUCTIVE CAD  . GERD (gastroesophageal reflux disease)   . History of kidney stones   . History of CVA (cerebrovascular accident) without residual deficits     2008  AND TIA IN 2008  W/ NO RESIDUAL  . History of peptic ulcer     2011--  RESOLVED  . 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 Gastroparesis due to DM   . Chronic kidney disease (CKD), stage III (moderate)     NEPHROLOGIST--  DR Freddrick March  . Open thigh wound     ANTERIOR  . Coronary artery disease CARDIOLOGIST--  DR Johnsie Cancel    NON-OBSTRUCTIVE CAD  PER CARDIAC CATH  2011  . Pericardial effusion without cardiac tamponade     PER DR NISHAN  NOTE --  NOT MALIGNANT  ? RELATED TO HYPOTHYROIDISM  . Thyromegaly     MULTINODULER GOITER  .  Hyperparathyroidism, secondary renal   . Anemia in chronic kidney disease   . Trigeminal neuralgia   . Hypothyroidism   . At risk for sleep apnea     STOP-BANG= 4   SENT TO PCP  03-25-2013    Past Surgical History  Procedure Laterality Date  . Transplantation renal  05/2006    CADAVERIC  . Breast biopsy  2010  . Cholecystectomy  2008  . Hemorrhoid surgery  01/23/2012    Procedure: HEMORRHOIDECTOMY PROLAPSED;  Surgeon: Leighton Ruff, MD;  Location: [facility];  Service: General;  Laterality: N/A;  . Foot surgery Right 2013  . Breast lumpectomy with sentinel lymph node biopsy Right 11/09/2012    Procedure: RIGHT BREAST LUMPECTOMY WITH SENTINEL LYMPH NODE REMOVAL;  Surgeon: Haywood Lasso, MD;  Location: [place];  Service: General;  Laterality: Right;  . Portacath placement Right 11/30/2012    Procedure: INSERTION PORT-A-CATH;  Surgeon: Haywood Lasso, MD;  Location: [place];  Service: General;  Laterality: Right;  . Irrigation and debridement abscess Left 02/14/2013    Procedure: IRRIGATION AND DEBRIDEMENT LEFT THIGH ABSCESS;  Surgeon: Joyice Faster. Cornett, MD;  Location: [place];  Service: General;  Laterality: Left;  . Incision and drainage of wound Left 02/18/2013    Procedure: IRRIGATION AND DEBRIDEMENT THIGH WOUND;  Surgeon: Rolm Bookbinder, MD;  Location: [place];  Service: General;  Laterality: Left;  . Cataract extraction w/ intraocular lens  implant, bilateral    . Repair  intestinal perforation surgery  01-31-2009  . Cardiac catheterization  04-12-2009  DR EVELYNE GOUDREAU ([facility] IN [place], [place])    NON-OBSTRUCTIVE CAD/  mLAD 40%,  oLAD 50%,  dCFX 40%,  OM1  40%,  mRCA  &  dRCA  50%/  LVEF 65% /  ELEVATED  LVEDP  . Transthoracic echocardiogram  01-19-2013  DR NISHAN    SEVERE LVH/  EF 16-60%/  GRADE I DIASTOLIC DYSFUNCTION/  MODERATE LAE/  MILD IMPROVEMENT OF MODERATE CIRCUMFERENTIAL INFERIOROLATERAL PERICARDIAL EFFUSION WITH NO TAMPONADE  .  Cardiovascular stress test  11-04-2012  DR NISHAN    HIGH RISK NUCLEAR STUDY/  FIXED DEFECT AFFECTING ENTIRE INFEROLATERAL AND ANTEROLATERAL WALL/  MODERATE ISCHEMIA  AT MID/ APICAL ANTEROR AND INFERIOR WALL/  GLOBAL HYPOKINESIS/  LVEF 28% (FELT TO BE FALSE +,  ECHO & cMRI  NORMAL EF AND NORMAL WM)  . Vaginal hysterectomy  1990's    W/  BILATERAL SALPINGOOPHORECTOMY  . Eye surgery Left 2003    REPAIR CORNEA    Family History  Problem Relation Age of Onset  . Kidney disease Neg Hx   . Arthritis Other     parent  . Heart disease Other     parent  . Hypertension Other     parent  . Diabetes Other     parent  . Diabetes Other     grandparent  . Thyroid disease Other     several  . Heart disease Father    Social History:  reports that she has never smoked. She has never used smokeless tobacco. She reports that she does not drink alcohol or use illicit drugs.  Allergies:  Allergies  Allergen Reactions  . Keflex [Cephalexin] Swelling    Swelling to face, chills Tolerates zosyn 02/14/13  . Propoxyphene N-Acetaminophen Hives and Itching     (Not in a hospital admission)  Results for orders placed in visit on 03/24/13 (from the past 48 hour(s))  TSH     Status: Abnormal   Collection Time    03/24/13  2:29 PM      Result Value Ref Range   TSH 15.44 (*) 0.35 - 5.50 uIU/mL  T4, FREE     Status: None   Collection Time    03/24/13  2:29 PM      Result Value Ref Range   Free T4 0.82  0.60 - 1.60 ng/dL   No results found.  Review of Systems  Constitutional: Negative.   HENT: Negative.   Eyes: Negative.   Respiratory: Negative.   Cardiovascular: Negative.   Gastrointestinal: Negative.   Genitourinary: Negative.   Musculoskeletal: Negative.   Skin: Negative.   Neurological: Negative.   Psychiatric/Behavioral: Negative.     There were no vitals taken for this visit. Physical Exam  Constitutional: She appears well-developed and well-nourished.  HENT:  Head:  Normocephalic and atraumatic.  Eyes: Conjunctivae and EOM are normal. Pupils are equal, round, and reactive to light.  Cardiovascular: Normal rate.   Respiratory: Effort normal.  GI: Soft.  Musculoskeletal: Normal range of motion.  Neurological: She is alert.  Skin: Skin is warm.  Psychiatric: She has a normal mood and affect. Her behavior is  normal. Judgment and thought content normal.     Assessment/Plan Recommend and plan for split thickness skin graft, Acell and possible VAC placement.  [place] 03/26/2013, 8:25 AM

## 2013-03-27 NOTE — Assessment & Plan Note (Signed)
stable overall by history and exam, recent data reviewed with pt, and pt to continue medical treatment as before,  to f/u any worsening symptoms or concerns BP Readings from Last 3 Encounters:  03/24/13 112/82  03/19/13 123/70  03/18/13 125/64

## 2013-03-27 NOTE — Assessment & Plan Note (Signed)
stable overall by history and exam, recent data reviewed with pt, and pt to continue medical treatment as now rx,  to f/u any worsening symptoms or concerns  Lab Results  Component Value Date   HGBA1C 10.7* 02/14/2013

## 2013-03-27 NOTE — Assessment & Plan Note (Signed)
Stable, cont current care

## 2013-03-27 NOTE — Assessment & Plan Note (Signed)
New onset, for f/u labs, sympt stable

## 2013-03-28 DIAGNOSIS — L02419 Cutaneous abscess of limb, unspecified: Secondary | ICD-10-CM | POA: Diagnosis not present

## 2013-03-28 DIAGNOSIS — L03119 Cellulitis of unspecified part of limb: Secondary | ICD-10-CM | POA: Diagnosis not present

## 2013-03-28 DIAGNOSIS — E11319 Type 2 diabetes mellitus with unspecified diabetic retinopathy without macular edema: Secondary | ICD-10-CM | POA: Diagnosis not present

## 2013-03-28 DIAGNOSIS — F489 Nonpsychotic mental disorder, unspecified: Secondary | ICD-10-CM | POA: Diagnosis not present

## 2013-03-28 DIAGNOSIS — M726 Necrotizing fasciitis: Secondary | ICD-10-CM | POA: Diagnosis not present

## 2013-03-28 DIAGNOSIS — E1139 Type 2 diabetes mellitus with other diabetic ophthalmic complication: Secondary | ICD-10-CM | POA: Diagnosis not present

## 2013-03-28 DIAGNOSIS — Z48817 Encounter for surgical aftercare following surgery on the skin and subcutaneous tissue: Secondary | ICD-10-CM | POA: Diagnosis not present

## 2013-03-29 ENCOUNTER — Encounter (HOSPITAL_BASED_OUTPATIENT_CLINIC_OR_DEPARTMENT_OTHER): Payer: Medicare Other | Admitting: Anesthesiology

## 2013-03-29 ENCOUNTER — Other Ambulatory Visit: Payer: Medicare Other

## 2013-03-29 ENCOUNTER — Encounter (HOSPITAL_BASED_OUTPATIENT_CLINIC_OR_DEPARTMENT_OTHER)
Admission: RE | Disposition: A | Discharge status: Home / Self Care | Payer: Self-pay | Source: Ambulatory Visit | Attending: Plastic Surgery

## 2013-03-29 ENCOUNTER — Ambulatory Visit (HOSPITAL_BASED_OUTPATIENT_CLINIC_OR_DEPARTMENT_OTHER): Payer: Medicare Other | Admitting: Anesthesiology

## 2013-03-29 ENCOUNTER — Ambulatory Visit (HOSPITAL_BASED_OUTPATIENT_CLINIC_OR_DEPARTMENT_OTHER)
Admission: RE | Admit: 2013-03-29 | Discharge: 2013-03-29 | Disposition: A | Discharge status: Home / Self Care | Payer: Medicare Other | Source: Ambulatory Visit | Attending: Plastic Surgery | Admitting: Plastic Surgery

## 2013-03-29 ENCOUNTER — Encounter (HOSPITAL_BASED_OUTPATIENT_CLINIC_OR_DEPARTMENT_OTHER): Payer: Self-pay | Admitting: *Deleted

## 2013-03-29 DIAGNOSIS — Z9221 Personal history of antineoplastic chemotherapy: Secondary | ICD-10-CM | POA: Insufficient documentation

## 2013-03-29 DIAGNOSIS — Z940 Kidney transplant status: Secondary | ICD-10-CM | POA: Insufficient documentation

## 2013-03-29 DIAGNOSIS — I2510 Atherosclerotic heart disease of native coronary artery without angina pectoris: Secondary | ICD-10-CM | POA: Diagnosis not present

## 2013-03-29 DIAGNOSIS — N183 Chronic kidney disease, stage 3 unspecified: Secondary | ICD-10-CM | POA: Diagnosis not present

## 2013-03-29 DIAGNOSIS — I129 Hypertensive chronic kidney disease with stage 1 through stage 4 chronic kidney disease, or unspecified chronic kidney disease: Secondary | ICD-10-CM | POA: Insufficient documentation

## 2013-03-29 DIAGNOSIS — Z9089 Acquired absence of other organs: Secondary | ICD-10-CM | POA: Diagnosis not present

## 2013-03-29 DIAGNOSIS — D649 Anemia, unspecified: Secondary | ICD-10-CM | POA: Insufficient documentation

## 2013-03-29 DIAGNOSIS — K219 Gastro-esophageal reflux disease without esophagitis: Secondary | ICD-10-CM | POA: Insufficient documentation

## 2013-03-29 DIAGNOSIS — E11319 Type 2 diabetes mellitus with unspecified diabetic retinopathy without macular edema: Secondary | ICD-10-CM | POA: Diagnosis not present

## 2013-03-29 DIAGNOSIS — I739 Peripheral vascular disease, unspecified: Secondary | ICD-10-CM | POA: Insufficient documentation

## 2013-03-29 DIAGNOSIS — E785 Hyperlipidemia, unspecified: Secondary | ICD-10-CM | POA: Insufficient documentation

## 2013-03-29 DIAGNOSIS — Z853 Personal history of malignant neoplasm of breast: Secondary | ICD-10-CM | POA: Insufficient documentation

## 2013-03-29 DIAGNOSIS — Z8673 Personal history of transient ischemic attack (TIA), and cerebral infarction without residual deficits: Secondary | ICD-10-CM | POA: Insufficient documentation

## 2013-03-29 DIAGNOSIS — I252 Old myocardial infarction: Secondary | ICD-10-CM | POA: Insufficient documentation

## 2013-03-29 DIAGNOSIS — N2581 Secondary hyperparathyroidism of renal origin: Secondary | ICD-10-CM | POA: Insufficient documentation

## 2013-03-29 DIAGNOSIS — L97109 Non-pressure chronic ulcer of unspecified thigh with unspecified severity: Secondary | ICD-10-CM

## 2013-03-29 DIAGNOSIS — E1139 Type 2 diabetes mellitus with other diabetic ophthalmic complication: Secondary | ICD-10-CM | POA: Diagnosis not present

## 2013-03-29 DIAGNOSIS — S71009A Unspecified open wound, unspecified hip, initial encounter: Secondary | ICD-10-CM | POA: Diagnosis not present

## 2013-03-29 DIAGNOSIS — L97909 Non-pressure chronic ulcer of unspecified part of unspecified lower leg with unspecified severity: Secondary | ICD-10-CM | POA: Insufficient documentation

## 2013-03-29 DIAGNOSIS — E039 Hypothyroidism, unspecified: Secondary | ICD-10-CM | POA: Insufficient documentation

## 2013-03-29 HISTORY — DX: Other specified personal risk factors, not elsewhere classified: Z91.89

## 2013-03-29 HISTORY — DX: Personal history of urinary calculi: Z87.442

## 2013-03-29 HISTORY — DX: Chronic kidney disease, stage 3 (moderate): N18.3

## 2013-03-29 HISTORY — DX: Old myocardial infarction: I25.2

## 2013-03-29 HISTORY — DX: Unspecified open wound, unspecified thigh, initial encounter: S71.109A

## 2013-03-29 HISTORY — PX: SKIN SPLIT GRAFT: SHX444

## 2013-03-29 HISTORY — DX: Type 2 diabetes mellitus without complications: E11.9

## 2013-03-29 HISTORY — DX: Secondary hyperparathyroidism of renal origin: N25.81

## 2013-03-29 HISTORY — DX: Personal history of peptic ulcer disease: Z87.11

## 2013-03-29 HISTORY — DX: Trigeminal neuralgia: G50.0

## 2013-03-29 HISTORY — DX: Type 2 diabetes mellitus with diabetic autonomic (poly)neuropathy: E11.43

## 2013-03-29 HISTORY — DX: Anemia in chronic kidney disease: N18.9

## 2013-03-29 HISTORY — DX: Hypothyroidism, unspecified: E03.9

## 2013-03-29 HISTORY — DX: Iodine-deficiency related diffuse (endemic) goiter: E01.0

## 2013-03-29 HISTORY — DX: Chronic kidney disease, stage 3 unspecified: N18.30

## 2013-03-29 HISTORY — DX: Anemia in chronic kidney disease: D63.1

## 2013-03-29 HISTORY — DX: Atherosclerotic heart disease of native coronary artery without angina pectoris: I25.10

## 2013-03-29 HISTORY — DX: Pericardial effusion (noninflammatory): I31.3

## 2013-03-29 HISTORY — DX: Other pericardial effusion (noninflammatory): I31.39

## 2013-03-29 HISTORY — DX: Gastroparesis: K31.84

## 2013-03-29 HISTORY — DX: Personal history of transient ischemic attack (TIA), and cerebral infarction without residual deficits: Z86.73

## 2013-03-29 LAB — POCT I-STAT, CHEM 8
BUN: 20 mg/dL (ref 6–23)
Calcium, Ion: 1.34 mmol/L — ABNORMAL HIGH (ref 1.13–1.30)
Chloride: 98 mEq/L (ref 96–112)
Creatinine, Ser: 1.3 mg/dL — ABNORMAL HIGH (ref 0.50–1.10)
Glucose, Bld: 117 mg/dL — ABNORMAL HIGH (ref 70–99)
HCT: 31 % — ABNORMAL LOW (ref 36.0–46.0)
Hemoglobin: 10.5 g/dL — ABNORMAL LOW (ref 12.0–15.0)
POTASSIUM: 4 meq/L (ref 3.7–5.3)
SODIUM: 140 meq/L (ref 137–147)
TCO2: 31 mmol/L (ref 0–100)

## 2013-03-29 LAB — GLUCOSE, CAPILLARY: Glucose-Capillary: 126 mg/dL — ABNORMAL HIGH (ref 70–99)

## 2013-03-29 SURGERY — APPLICATION, GRAFT, SKIN, SPLIT-THICKNESS
Anesthesia: General | Site: Leg Upper | Laterality: Left

## 2013-03-29 MED ORDER — SODIUM CHLORIDE 0.9 % IJ SOLN
INTRAMUSCULAR | Status: DC | PRN
  Administered 2013-03-29: 20 mL via INTRAVENOUS

## 2013-03-29 MED ORDER — FENTANYL CITRATE 0.05 MG/ML IJ SOLN
25.0000 ug | INTRAMUSCULAR | Status: DC | PRN
  Filled 2013-03-29: qty 1

## 2013-03-29 MED ORDER — HYDROCODONE-ACETAMINOPHEN 5-325 MG PO TABS
1.0000 | ORAL_TABLET | Freq: Once | ORAL | Status: AC
  Administered 2013-03-29: 1 via ORAL
  Filled 2013-03-29: qty 1

## 2013-03-29 MED ORDER — FENTANYL CITRATE 0.05 MG/ML IJ SOLN
INTRAMUSCULAR | Status: AC
  Filled 2013-03-29: qty 6

## 2013-03-29 MED ORDER — ONDANSETRON HCL 4 MG/2ML IJ SOLN
INTRAMUSCULAR | Status: DC | PRN
  Administered 2013-03-29: 4 mg via INTRAVENOUS

## 2013-03-29 MED ORDER — CIPROFLOXACIN IN D5W 400 MG/200ML IV SOLN
INTRAVENOUS | Status: AC
  Filled 2013-03-29: qty 200

## 2013-03-29 MED ORDER — LIDOCAINE HCL (CARDIAC) 20 MG/ML IV SOLN
INTRAVENOUS | Status: DC | PRN
  Administered 2013-03-29: 60 mg via INTRAVENOUS

## 2013-03-29 MED ORDER — PROMETHAZINE HCL 25 MG/ML IJ SOLN
6.2500 mg | INTRAMUSCULAR | Status: DC | PRN
  Filled 2013-03-29: qty 1

## 2013-03-29 MED ORDER — CIPROFLOXACIN IN D5W 400 MG/200ML IV SOLN
400.0000 mg | INTRAVENOUS | Status: AC
  Administered 2013-03-29: 400 mg via INTRAVENOUS
  Filled 2013-03-29: qty 200

## 2013-03-29 MED ORDER — EPHEDRINE SULFATE 50 MG/ML IJ SOLN
INTRAMUSCULAR | Status: DC | PRN
Start: 2013-03-29 — End: 2013-03-29
  Administered 2013-03-29 (×2): 5 mg via INTRAVENOUS

## 2013-03-29 MED ORDER — EVICEL 5 ML EX KIT
PACK | CUTANEOUS | Status: DC | PRN
  Administered 2013-03-29: 5 mL via TOPICAL

## 2013-03-29 MED ORDER — BUPIVACAINE-EPINEPHRINE 0.25% -1:200000 IJ SOLN
INTRAMUSCULAR | Status: DC | PRN
  Administered 2013-03-29: 30 mL

## 2013-03-29 MED ORDER — FENTANYL CITRATE 0.05 MG/ML IJ SOLN
INTRAMUSCULAR | Status: DC | PRN
  Administered 2013-03-29 (×2): 25 ug via INTRAVENOUS
  Administered 2013-03-29: 50 ug via INTRAVENOUS

## 2013-03-29 MED ORDER — MIDAZOLAM HCL 2 MG/2ML IJ SOLN
INTRAMUSCULAR | Status: AC
  Filled 2013-03-29: qty 2

## 2013-03-29 MED ORDER — SODIUM CHLORIDE 0.9 % IV SOLN
INTRAVENOUS | Status: DC
  Administered 2013-03-29: 14:00:00 via INTRAVENOUS
  Filled 2013-03-29: qty 1000

## 2013-03-29 MED ORDER — PROPOFOL 10 MG/ML IV BOLUS
INTRAVENOUS | Status: DC | PRN
  Administered 2013-03-29: 150 mg via INTRAVENOUS

## 2013-03-29 MED ORDER — MIDAZOLAM HCL 5 MG/5ML IJ SOLN
INTRAMUSCULAR | Status: DC | PRN
  Administered 2013-03-29: 1 mg via INTRAVENOUS

## 2013-03-29 MED ORDER — HYDROCODONE-ACETAMINOPHEN 5-325 MG PO TABS
ORAL_TABLET | ORAL | Status: AC
  Filled 2013-03-29: qty 1

## 2013-03-29 SURGICAL SUPPLY — 88 items
BAG DECANTER FOR FLEXI CONT (MISCELLANEOUS) IMPLANT
BANDAGE ELASTIC 3 VELCRO ST LF (GAUZE/BANDAGES/DRESSINGS) IMPLANT
BANDAGE ELASTIC 4 VELCRO ST LF (GAUZE/BANDAGES/DRESSINGS) IMPLANT
BANDAGE ELASTIC 6 VELCRO ST LF (GAUZE/BANDAGES/DRESSINGS) IMPLANT
BENZOIN TINCTURE PRP APPL 2/3 (GAUZE/BANDAGES/DRESSINGS) IMPLANT
BLADE DERMATOME II (BLADE) ×3 IMPLANT
BLADE DERMATOME SS (BLADE) IMPLANT
BLADE HEX COATED 2.75 (ELECTRODE) IMPLANT
BLADE SURG 10 STRL SS (BLADE) IMPLANT
BLADE SURG 15 STRL LF DISP TIS (BLADE) ×1 IMPLANT
BLADE SURG 15 STRL SS (BLADE) ×2
BLADE SURG ROTATE 9660 (MISCELLANEOUS) IMPLANT
BNDG GAUZE ELAST 4 BULKY (GAUZE/BANDAGES/DRESSINGS) IMPLANT
CLOSURE WOUND 1/2 X4 (GAUZE/BANDAGES/DRESSINGS)
CLOTH BEACON ORANGE TIMEOUT ST (SAFETY) ×3 IMPLANT
COTTONBALL LRG STERILE PKG (GAUZE/BANDAGES/DRESSINGS) IMPLANT
COVER MAYO STAND STRL (DRAPES) ×3 IMPLANT
COVER TABLE BACK 60X90 (DRAPES) ×3 IMPLANT
DECANTER SPIKE VIAL GLASS SM (MISCELLANEOUS) IMPLANT
DERMABOND ADVANCED (GAUZE/BANDAGES/DRESSINGS)
DERMABOND ADVANCED .7 DNX12 (GAUZE/BANDAGES/DRESSINGS) IMPLANT
DERMACARRIERS GRAFT 1 TO 1.5 (DISPOSABLE)
DRAPE EXTREMITY T 121X128X90 (DRAPE) IMPLANT
DRAPE INCISE IOBAN 66X45 STRL (DRAPES) ×3 IMPLANT
DRAPE ORTHO SPLIT 77X108 STRL (DRAPES) ×4
DRAPE PED LAPAROTOMY (DRAPES) IMPLANT
DRAPE SURG 17X23 STRL (DRAPES) ×12 IMPLANT
DRAPE SURG ORHT 6 SPLT 77X108 (DRAPES) ×2 IMPLANT
DRSG ADAPTIC 3X8 NADH LF (GAUZE/BANDAGES/DRESSINGS) ×6 IMPLANT
DRSG EMULSION OIL 3X3 NADH (GAUZE/BANDAGES/DRESSINGS) IMPLANT
DRSG OPSITE 6X11 MED (GAUZE/BANDAGES/DRESSINGS) IMPLANT
ELECT REM PT RETURN 9FT ADLT (ELECTROSURGICAL) ×3
ELECTRODE REM PT RTRN 9FT ADLT (ELECTROSURGICAL) ×1 IMPLANT
GAUZE SPONGE 4X4 16PLY XRAY LF (GAUZE/BANDAGES/DRESSINGS) IMPLANT
GAUZE XEROFORM 5X9 LF (GAUZE/BANDAGES/DRESSINGS) ×3 IMPLANT
GLOVE BIO SURGEON STRL SZ 6.5 (GLOVE) ×4 IMPLANT
GLOVE BIO SURGEON STRL SZ7.5 (GLOVE) ×3 IMPLANT
GLOVE BIO SURGEONS STRL SZ 6.5 (GLOVE) ×2
GLOVE INDICATOR 7.5 STRL GRN (GLOVE) ×3 IMPLANT
GOWN PREVENTION PLUS XLARGE (GOWN DISPOSABLE) ×3 IMPLANT
GOWN STRL REUS W/ TWL LRG LVL3 (GOWN DISPOSABLE) ×1 IMPLANT
GOWN STRL REUS W/ TWL XL LVL3 (GOWN DISPOSABLE) ×1 IMPLANT
GOWN STRL REUS W/TWL LRG LVL3 (GOWN DISPOSABLE) ×2
GOWN STRL REUS W/TWL XL LVL3 (GOWN DISPOSABLE) ×2
GRAFT DERMACARRIERS 1 TO 1.5 (DISPOSABLE) IMPLANT
MATRIX SURGICAL PSM 10X15CM (Tissue) ×3 IMPLANT
MATRIX SURGICAL PSM 5X5CM (Tissue) ×6 IMPLANT
MICROMATRIX 500MG (Tissue) ×3 IMPLANT
NEEDLE 27GAX1X1/2 (NEEDLE) ×3 IMPLANT
NEEDLE HYPO 22GX1.5 SAFETY (NEEDLE) ×3 IMPLANT
NS IRRIG 500ML POUR BTL (IV SOLUTION) ×3 IMPLANT
PACK BASIN DAY SURGERY FS (CUSTOM PROCEDURE TRAY) ×3 IMPLANT
PAD ABD 8X10 STRL (GAUZE/BANDAGES/DRESSINGS) ×3 IMPLANT
PAD CAST 3X4 CTTN HI CHSV (CAST SUPPLIES) IMPLANT
PAD CAST 4YDX4 CTTN HI CHSV (CAST SUPPLIES) IMPLANT
PADDING CAST ABS 4INX4YD NS (CAST SUPPLIES)
PADDING CAST ABS COTTON 4X4 ST (CAST SUPPLIES) IMPLANT
PADDING CAST COTTON 3X4 STRL (CAST SUPPLIES)
PADDING CAST COTTON 4X4 STRL (CAST SUPPLIES)
PENCIL BUTTON HOLSTER BLD 10FT (ELECTRODE) ×3 IMPLANT
SHEET MEDIUM DRAPE 40X70 STRL (DRAPES) IMPLANT
SOLUTION PARTIC MCRMTRX 500MG (Tissue) ×1 IMPLANT
SPONGE GAUZE 4X4 12PLY (GAUZE/BANDAGES/DRESSINGS) ×6 IMPLANT
SPONGE LAP 18X18 X RAY DECT (DISPOSABLE) ×3 IMPLANT
STAPLER VISISTAT (STAPLE) ×3 IMPLANT
STAPLER VISISTAT 35W (STAPLE) IMPLANT
STOCKINETTE 4X48 STRL (DRAPES) IMPLANT
STOCKINETTE 6  STRL (DRAPES)
STOCKINETTE 6 STRL (DRAPES) IMPLANT
STOCKINETTE IMPERVIOUS LG (DRAPES) IMPLANT
STRIP CLOSURE SKIN 1/2X4 (GAUZE/BANDAGES/DRESSINGS) IMPLANT
SURGILUBE 2OZ TUBE FLIPTOP (MISCELLANEOUS) ×6 IMPLANT
SUT MON AB 5-0 PS2 18 (SUTURE) IMPLANT
SUT SILK 3 0 SH CR/8 (SUTURE) IMPLANT
SUT SILK 4 0 SH CR/8 (SUTURE) IMPLANT
SUT VIC AB 3-0 FS2 27 (SUTURE) IMPLANT
SUT VIC AB 5-0 P-3 18X BRD (SUTURE) ×5 IMPLANT
SUT VIC AB 5-0 P3 18 (SUTURE) ×10
SUT VICRYL 4-0 PS2 18IN ABS (SUTURE) IMPLANT
SYR BULB 3OZ (MISCELLANEOUS) ×3 IMPLANT
SYR CONTROL 10ML LL (SYRINGE) ×3 IMPLANT
TAPE CLOTH SURG 6X10 WHT LF (GAUZE/BANDAGES/DRESSINGS) ×3 IMPLANT
TIP FLEX 45CM EVICEL (HEMOSTASIS) IMPLANT
TIP RIGID 35CM EVICEL (HEMOSTASIS) IMPLANT
TOWEL OR 17X24 6PK STRL BLUE (TOWEL DISPOSABLE) ×3 IMPLANT
TRAY DSU PREP LF (CUSTOM PROCEDURE TRAY) ×3 IMPLANT
UNDERPAD 30X30 INCONTINENT (UNDERPADS AND DIAPERS) ×3 IMPLANT
WATER STERILE IRR 1000ML POUR (IV SOLUTION) ×3 IMPLANT

## 2013-03-29 NOTE — Op Note (Signed)
Operative Note   DATE OF OPERATION: 03/29/2013  LOCATION: [facility]  SURGICAL DIVISION: Plastic Surgery  PREOPERATIVE DIAGNOSES:  Left leg ulcer secondary to hematoma  POSTOPERATIVE DIAGNOSES:  same  PROCEDURE:  Split thickness skin graft to left leg ulcer (22 x 12) with Acell (10 x 15 sheet and 2 5 x 5 sheets, and 500 mg powder) and VAC placement  SURGEON: Evonne Rinks [place], DO  ASSISTANT: none  ANESTHESIA:  General.   COMPLICATIONS: None.   INDICATIONS FOR PROCEDURE:  The patient, Monica Lee, is a 63 y.o. female born on [birth year], is here for treatment of left leg ulcer.   CONSENT:  Informed consent was obtained directly from the patient. Risks, benefits and alternatives were fully discussed. Specific risks including but not limited to bleeding, infection, hematoma, seroma, scarring, pain, implant infection, implant extrusion, capsular contracture, asymmetry, wound healing problems, and need for further surgery were all discussed. The patient did have an ample opportunity to have questions answered to satisfaction.   DESCRIPTION OF PROCEDURE:  The patient was taken to the operating room. SCDs were placed and IV antibiotics were given. The patient's operative site was prepped and draped in a sterile fashion. A time out was performed and all information was confirmed to be correct.  General anesthesia was administered.  The right leg was injected with local for intraoperative hemostasis and postoperative pain management.  The dermatome was set at 12/998 inches and used to obtain the graft on the right donor leg. Pie crusting was done to help with spreading the graft over the recipient site.  The right leg was was dressed with Acell sheet, surgical lube, xeroform and ABDs.  The left leg was debrided and hemostasis achieve with pressure. The Evicel and Acell powder was placed on the site and the skin graft placed over it. The graft was secured with 5-0 Vicryl.  There  was a small area of tissue exposed and this was covered with an Acell sheet. The adaptic was applied with the VAC set at 125 mmHg pressure.  The iobane was used to secure the [facility].  The patient tolerated the procedure well.  There were no complications. The patient was allowed to wake from anesthesia, extubated and taken to the recovery room in satisfactory condition.

## 2013-03-29 NOTE — Anesthesia Procedure Notes (Signed)
Procedure Name: LMA Insertion Date/Time: 03/29/2013 2:34 PM Performed by: Denna Haggard D Pre-anesthesia Checklist: Patient identified, Emergency Drugs available, Suction available and Patient being monitored Patient Re-evaluated:Patient Re-evaluated prior to inductionOxygen Delivery Method: Circle System Utilized Preoxygenation: Pre-oxygenation with 100% oxygen Intubation Type: IV induction Ventilation: Mask ventilation without difficulty LMA: LMA inserted LMA Size: 4.0 Number of attempts: 1 Airway Equipment and Method: bite block Placement Confirmation: positive ETCO2 Tube secured with: Tape Dental Injury: Teeth and Oropharynx as per pre-operative assessment

## 2013-03-29 NOTE — Transfer of Care (Signed)
Immediate Anesthesia Transfer of Care Note  Patient: Monica Lee  Procedure(s) Performed: Procedure(s) (LRB): SKIN GRAFT SPLIT THICKNESS WITH A-CELL AND VAC TO LEFT THIGH (Left)  Patient Location: PACU  Anesthesia Type: General  Level of Consciousness: awake, oriented, sedated and patient cooperative  Airway & Oxygen Therapy: Patient Spontanous Breathing and Patient connected to face mask oxygen  Post-op Assessment: Report given to PACU RN and Post -op Vital signs reviewed and stable  Post vital signs: Reviewed and stable  Complications: No apparent anesthesia complications

## 2013-03-29 NOTE — Anesthesia Preprocedure Evaluation (Addendum)
Anesthesia Evaluation  Patient identified by MRN, date of birth, ID band Patient awake    Reviewed: Allergy & Precautions, H&P , NPO status , Patient's Chart, lab work & pertinent test results  Airway Mallampati: II TM Distance: >3 FB Neck ROM: Full    Dental no notable dental hx.    Pulmonary asthma ,  breath sounds clear to auscultation  Pulmonary exam normal       Cardiovascular hypertension, Pt. on medications + Peripheral Vascular Disease and +CHF Rhythm:Regular Rate:Normal   There is severe left and right ventricular hypertrophy   with a suspicion for an infiltrative disease such as   amyloidosis. A cardiac MRI is recommended.    Neuro/Psych TIAnegative psych ROS   GI/Hepatic Neg liver ROS, GERD-  Medicated,  Endo/Other  diabetes, Insulin Dependent  Renal/GU Renal InsufficiencyRenal diseaseS/P kidney transplant  negative genitourinary   Musculoskeletal negative musculoskeletal ROS (+)   Abdominal   Peds negative pediatric ROS (+)  Hematology negative hematology ROS (+) anemia ,   Anesthesia Other Findings   Reproductive/Obstetrics negative OB ROS                          Anesthesia Physical Anesthesia Plan  ASA: III  Anesthesia Plan: General   Post-op Pain Management:    Induction: Intravenous  Airway Management Planned: LMA  Additional Equipment:   Intra-op Plan:   Post-operative Plan:   Informed Consent: I have reviewed the patients History and Physical, chart, labs and discussed the procedure including the risks, benefits and alternatives for the proposed anesthesia with the patient or authorized representative who has indicated his/her understanding and acceptance.   Dental advisory given  Plan Discussed with: CRNA and Surgeon  Anesthesia Plan Comments:         Anesthesia Quick Evaluation

## 2013-03-29 NOTE — H&P (View-Only) (Signed)
Monica Lee is an 63 y.o. female.   Chief Complaint: left thigh ulcer HPI: The patient is a 63 yrs old female here for treatment of a large left thigh ulcer.  She underwent debridement several times in the past and the area is now clean and no sign of infection.  She has multiple medical conditions but they are stable at present.  Past Medical History  Diagnosis Date  . S/P kidney transplant     CADAVERIC --  05/2006  . Hyperlipidemia   . DJD (degenerative joint disease)   . Hx of colonic polyps   . Diabetic retinopathy     RIGHT EYE  . Hypertension   . Asthma   . Blind left eye     OCCLUDED CORNEA,  FUNDI--  FAILED SURGERY REPAIR 2003  . Chronic diastolic CHF (congestive heart failure)     a. Cardiac MRI (10/2012): Moderate to severe LVH, EF 55%, chordal SAM but no LVOT gradient, mod circumferential effusion, mild LAE.  b.  Echocardiogram (11/06/12): Severe LVH, EF 123456, grade 1 diastolic dysfunction, moderate effusion.  . Wears glasses   . History of myocardial infarction     2011--  S/P CARDIAC CATH ([place], [place])  NON-OBSTRUCTIVE CAD  . GERD (gastroesophageal reflux disease)   . History of kidney stones   . History of CVA (cerebrovascular accident) without residual deficits     2008  AND TIA IN 2008  W/ NO RESIDUAL  . History of peptic ulcer     2011--  RESOLVED  . 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 Gastroparesis due to DM   . Chronic kidney disease (CKD), stage III (moderate)     NEPHROLOGIST--  DR Freddrick March  . Open thigh wound     ANTERIOR  . Coronary artery disease CARDIOLOGIST--  DR Johnsie Cancel    NON-OBSTRUCTIVE CAD  PER CARDIAC CATH  2011  . Pericardial effusion without cardiac tamponade     PER DR NISHAN  NOTE --  NOT MALIGNANT  ? RELATED TO HYPOTHYROIDISM  . Thyromegaly     MULTINODULER GOITER  .  Hyperparathyroidism, secondary renal   . Anemia in chronic kidney disease   . Trigeminal neuralgia   . Hypothyroidism   . At risk for sleep apnea     STOP-BANG= 4   SENT TO PCP  03-25-2013    Past Surgical History  Procedure Laterality Date  . Transplantation renal  05/2006    CADAVERIC  . Breast biopsy  2010  . Cholecystectomy  2008  . Hemorrhoid surgery  01/23/2012    Procedure: HEMORRHOIDECTOMY PROLAPSED;  Surgeon: Leighton Ruff, MD;  Location: [facility];  Service: General;  Laterality: N/A;  . Foot surgery Right 2013  . Breast lumpectomy with sentinel lymph node biopsy Right 11/09/2012    Procedure: RIGHT BREAST LUMPECTOMY WITH SENTINEL LYMPH NODE REMOVAL;  Surgeon: Haywood Lasso, MD;  Location: [facility];  Service: General;  Laterality: Right;  . Portacath placement Right 11/30/2012    Procedure: INSERTION PORT-A-CATH;  Surgeon: Haywood Lasso, MD;  Location: [facility];  Service: General;  Laterality: Right;  . Irrigation and debridement abscess Left 02/14/2013    Procedure: IRRIGATION AND DEBRIDEMENT LEFT THIGH ABSCESS;  Surgeon: Joyice Faster. Cornett, MD;  Location: [facility];  Service: General;  Laterality: Left;  . Incision and drainage of wound Left 02/18/2013    Procedure: IRRIGATION AND DEBRIDEMENT THIGH WOUND;  Surgeon: Rolm Bookbinder, MD;  Location: [facility];  Service: General;  Laterality: Left;  . Cataract extraction w/ intraocular lens  implant, bilateral    . Repair  intestinal perforation surgery  01-31-2009  . Cardiac catheterization  04-12-2009  DR EVELYNE GOUDREAU ([facility] IN [place], [place])    NON-OBSTRUCTIVE CAD/  mLAD 40%,  oLAD 50%,  dCFX 40%,  OM1  40%,  mRCA  &  dRCA  50%/  LVEF 65% /  ELEVATED  LVEDP  . Transthoracic echocardiogram  01-19-2013  DR NISHAN    SEVERE LVH/  EF 16-60%/  GRADE I DIASTOLIC DYSFUNCTION/  MODERATE LAE/  MILD IMPROVEMENT OF MODERATE CIRCUMFERENTIAL INFERIOROLATERAL PERICARDIAL EFFUSION WITH NO TAMPONADE  .  Cardiovascular stress test  11-04-2012  DR NISHAN    HIGH RISK NUCLEAR STUDY/  FIXED DEFECT AFFECTING ENTIRE INFEROLATERAL AND ANTEROLATERAL WALL/  MODERATE ISCHEMIA  AT MID/ APICAL ANTEROR AND INFERIOR WALL/  GLOBAL HYPOKINESIS/  LVEF 28% (FELT TO BE FALSE +,  ECHO & cMRI  NORMAL EF AND NORMAL WM)  . Vaginal hysterectomy  1990's    W/  BILATERAL SALPINGOOPHORECTOMY  . Eye surgery Left 2003    REPAIR CORNEA    Family History  Problem Relation Age of Onset  . Kidney disease Neg Hx   . Arthritis Other     parent  . Heart disease Other     parent  . Hypertension Other     parent  . Diabetes Other     parent  . Diabetes Other     grandparent  . Thyroid disease Other     several  . Heart disease Father    Social History:  reports that she has never smoked. She has never used smokeless tobacco. She reports that she does not drink alcohol or use illicit drugs.  Allergies:  Allergies  Allergen Reactions  . Keflex [Cephalexin] Swelling    Swelling to face, chills Tolerates zosyn 02/14/13  . Propoxyphene N-Acetaminophen Hives and Itching     (Not in a hospital admission)  Results for orders placed in visit on 03/24/13 (from the past 48 hour(s))  TSH     Status: Abnormal   Collection Time    03/24/13  2:29 PM      Result Value Ref Range   TSH 15.44 (*) 0.35 - 5.50 uIU/mL  T4, FREE     Status: None   Collection Time    03/24/13  2:29 PM      Result Value Ref Range   Free T4 0.82  0.60 - 1.60 ng/dL   No results found.  Review of Systems  Constitutional: Negative.   HENT: Negative.   Eyes: Negative.   Respiratory: Negative.   Cardiovascular: Negative.   Gastrointestinal: Negative.   Genitourinary: Negative.   Musculoskeletal: Negative.   Skin: Negative.   Neurological: Negative.   Psychiatric/Behavioral: Negative.     There were no vitals taken for this visit. Physical Exam  Constitutional: She appears well-developed and well-nourished.  HENT:  Head:  Normocephalic and atraumatic.  Eyes: Conjunctivae and EOM are normal. Pupils are equal, round, and reactive to light.  Cardiovascular: Normal rate.   Respiratory: Effort normal.  GI: Soft.  Musculoskeletal: Normal range of motion.  Neurological: She is alert.  Skin: Skin is warm.  Psychiatric: She has a normal mood and affect. Her behavior is  normal. Judgment and thought content normal.     Assessment/Plan Recommend and plan for split thickness skin graft, Acell and possible VAC placement.  [place] 03/26/2013, 8:25 AM

## 2013-03-29 NOTE — Discharge Instructions (Signed)
Keep VAC on left leg at 125 mmHg pressure - do not change or remove Change dressing on right leg as needed (daily if needed) dry dressing.    Post Anesthesia Home Care Instructions  Activity: Get plenty of rest for the remainder of the day. A responsible adult should stay with you for 24 hours following the procedure.  For the next 24 hours, DO NOT: -Drive a car -Paediatric nurse -Drink alcoholic beverages -Take any medication unless instructed by your physician -Make any legal decisions or sign important papers.  Meals: Start with liquid foods such as gelatin or soup. Progress to regular foods as tolerated. Avoid greasy, spicy, heavy foods. If nausea and/or vomiting occur, drink only clear liquids until the nausea and/or vomiting subsides. Call your physician if vomiting continues.  Special Instructions/Symptoms: Your throat may feel dry or sore from the anesthesia or the breathing tube placed in your throat during surgery. If this causes discomfort, gargle with warm salt water. The discomfort should disappear within 24 hours. Vacuum-Assisted Closure Therapy Home Guide Vacuum-assisted closure therapy (VAC therapy) is a device that helps wounds heal. It is used on wounds that cannot be closed with stitches. They often heal slowly. VAC therapy helps the wound stay clean and healthy while its edges slowly grow back together. VAC therapy uses a bandage (dressing) that is made of foam. It is put inside the wound. Then, a drape is placed over the wound. This drape sticks to your skin (adhesive) to keep air out. A tube is hooked up to a small pump and is attached to the drape. The pump sucks fluid and germs from the wound. It can also decrease any bad smell that comes from the wound. RISKS AND COMPLICATIONS VAC therapy is usually safe to use at home. Your skin may get sore from the adhesive drape. That is the most common problem. However, more serious problems can develop, such as:   Bleeding.  This can happen if the dressing in the wound comes into contact with blood vessels. A little bleeding may occur when the dressing is being changed. This is normal now and then. Major bleeding can happen if a large blood vessel breaks. This is more likely if you are taking blood-thinning medicine. Emergency surgery may be needed.  Infection. This can happen if the dressing has an air leak that is not repaired within a couple of hours.  Dehydration. This can happen if the pump sucks out too much body fluid. DRESSING CHANGES Your dressing will have to be changed. Sometimes this is needed once a day. Other times, a dressing change must be done 3 times a week. How often you change your dressing will depend on what your wound is like. A trained caregiver will most likely change the dressing. However, a family member or friend may be trained to change the dressing. Below are steps to change a dressing in order to prevent an infection. The steps apply to you or the person that changes your dressing.  Wash your hands with soap and water before and after each dressing change.  Wear gloves and protective clothing. This may include eye protection.  Do not allow anyone to change your dressing if they have an infection or a skin condition. Even a small cut can be a problem. To change the dressing:   Turn off the pump.  Take off the adhesive drape.  Disconnect the tube from the dressing.  Take out the dressing that is inside the wound. If the  dressing sticks, use a germ-free (sterile), saltwater solution to wet the dressing. This helps it come out more easily. If it hurts when the dressing is changed, take pain medicine 30 minutes before the dressing change.  Cleanse the wound with normal saline or sterile water.  Apply a skin barrier film to the skin that will be covered with the drape. This will protect the skin.  Put a new dressing into the wound.  Apply a new drape and tube.  Replace the  container in the pump that collects fluid if it is full. Do this at least once per week.  Turn the pump back on.  Your doctor will decide what setting of suction is best. Do not change the settings on the machine without talking to your nurse or doctor. HOME CARE INSTRUCTIONS   The VAC pump has an alarm. It goes off if there are any problems such as a leak.  Ask your caregiver what to do if the alarm goes off.  Call your caregiver right away if the alarm goes off and you cannot fix the problem.  Do not turn off the pump for more than 2 hours.  Check your wound carefully at each dressing change for signs of infection. Watch for redness, swelling, or any fluid leaking from the wound. If you develop an infection:  You may have to stop VAC therapy.  The wound will need to be cleaned and washed out.  You will have to take antibiotic medicine.  Ask your caregiver what activities you should or should not do while you are getting VAC therapy. This will depend on your particular wound.  Ask if it is okay to turn off the pump so you can take a shower. If it is okay, make sure the wound is covered with plastic. The wound area must stay dry.  Drink enough fluids to keep your urine clear or pale yellow.  Eat foods that contain a lot of protein. Examples are meat, poultry, seafood, eggs, nuts, beans, and peas. Protein can help your wound heal. SEEK MEDICAL CARE IF:  Your wound itches or hurts.  Dressing changes are often painful or bleeding often occurs.  You have a headache.  You have diarrhea.  You have a sore throat.  You have a rash.  You feel nauseous.  You feel dizzy or weak. SEEK IMMEDIATE MEDICAL CARE IF:   You have very bad pain.  You have bleeding that will not stop.  Your wound smells bad.  You have redness, swelling, or fluid leaking from your wound.  Your alarm goes off and you do not know what to do.  You have a fever. Document Released: 03/25/2011 Document  Reviewed: 03/25/2011 [facility] Patient Information 2014 [place].  Skin Grafting Skin-grafting is a procedure in which a patch of healthy skin is removed from one part of your body and moved to another part of your body which has no skin. It is done under an anesthetic. This means medications are used to make you pain free while healthy skin is removed. The place where skin is removed is called the donor site. The skin is removed by use of a dermatome. A dermatome is a skin-cutting instrument which removes a very thin layer of skin. This is called a split-thickness skin graft. The graft contains the epidermis (top skin layer) and a portion of the dermis. The dermis is the part of the skin which contains blood vessels, nerves, hair follicles, oil and sweat glands. The  dermis left behind in the donor area is able to grow back new skin. The donor site will be tender until the new skin grows back. The tenderness comes from the nerves which have been uncovered with the skin removal. The donor site can be taken from any area of the body. It is usually an area that is hidden by clothes. Common sites are the buttocks or thighs. The site picked depends partly on the visibility of the donor skin and color match. The graft is carefully spread on the bare area to be covered. It is held in place either by gentle pressure from a padded dressing or by a few small stitches. The raw donor area is covered with a sterile nonstick dressing for 3-5 days. This provides comfort and protects the open skin from infection. For more extensive tissue loss, a full-thickness skin graft may be necessary. This includes the entire thickness of the skin. Common donor sites include flaps from the back or abdominal wall. Full-thickness grafts are used when a lot of tissue is lost. This sometimes happens with open fractures of the lower leg. Full-thickness graft uses both layers (epidermis and dermis) of the skin. Benefits are better  contour, more natural color, and less reduction at the grafted site. Disadvantage of full-thickness skin grafts is that the wound at the donor site is larger and requires more careful management. Sometimes a split-thickness graft must be used to cover the donor site. Other types of skin grafts include skin taken from a cadaver or from an animal. It is called an allograft when skin is taken from one person and grafted to another person. It is called a xenograft when skin is taken from one species (such as a pig) and transplanted to another species (a human). Disadvantages of these grafts are that the body may reject them. Rejection means that your body tries to get rid of the skin. Artificial skin products are available which can be used and the body will not reject these. INDICATIONS Skin grafts may be recommended for:  Large wounds.  Surgeries which require skin grafts for healing.  Any areas of skin loss such as burns.  Cosmetic reasons on reconstructive surgeries.  The advantages of rapid skin grafting are that they help prevent fluid loss in the case of burns and they provide a sterile covering which helps prevent infection. RISKS AND COMPLICATIONS  Infection.  Loss of grafted skin.  Bleeding.  Reaction to anesthesia.  Getting an infectious disease if the graft is from someone else (an allograft). PROGNOSIS  New blood vessels begin growing from the recipient area into the transplanted skin within 36 hours. Most skin grafts do well. However in some cases if they do not heal well it can require repeat grafting. Scarring can occur in large wounds. If this is over a joint it can limit the movement of the joint. Poor result from grafting can result in poor cosmetic results. This means it may not look as good as you would like. RECOVERY  Recovery from grafting is usually quick following split thickness grafting. The skin graft must be protected from injury or stretching for 2-3 weeks.  Depending on the location of the graft, a dressing may be necessary for 1-2 weeks or as directed. Avoid all exercises that could stretch or injure the graft for at least one month or as directed by your surgeon. Full-thickness grafts require a longer period of recovery. A hospital stay is most often necessary.  Grafted areas and donor  sites should be protected from the sun with clothes or sunscreen. Wynelle Link exposure should be avoided until both areas are well healed.  Grafting sites are always a little more likely to get injured than your original equipment. Protect them always. Document Released: 08/30/2004 Document Revised: 03/25/2011 Document Reviewed: 02/16/2007 [facility] Patient Information 2014 [place], [place].

## 2013-03-29 NOTE — Progress Notes (Signed)
Family verbalized their concern regarding vac care and canister change.  Dr. Migdalia Dk called by nurse and informed of pt and family's request to speak w/ her .  Daughter- in- law spoke w Dr. Migdalia Dk. Husband to bring pt back to the wound center in the am for canister change. Family and pt agreeable.

## 2013-03-29 NOTE — Anesthesia Postprocedure Evaluation (Signed)
  Anesthesia Post-op Note  Patient: Monica Lee  Procedure(s) Performed: Procedure(s) (LRB): SKIN GRAFT SPLIT THICKNESS WITH A-CELL AND VAC TO LEFT THIGH (Left)  Patient Location: PACU  Anesthesia Type: General  Level of Consciousness: awake and alert   Airway and Oxygen Therapy: Patient Spontanous Breathing  Post-op Pain: mild  Post-op Assessment: Post-op Vital signs reviewed, Patient's Cardiovascular Status Stable, Respiratory Function Stable, Patent Airway and No signs of Nausea or vomiting  Last Vitals:  Filed Vitals:   03/29/13 1645  BP: 135/76  Pulse: 65  Temp:   Resp: 16    Post-op Vital Signs: stable   Complications: No apparent anesthesia complications

## 2013-03-29 NOTE — Brief Op Note (Signed)
03/29/2013  3:49 PM  PATIENT:  [name]  63 y.o. female  PRE-OPERATIVE DIAGNOSIS:  LEFT ANTERIOR THIGH WOUND  POST-OPERATIVE DIAGNOSIS:  LEFT ANTERIOR THIGH WOUND  PROCEDURE:  Procedure(s): SKIN GRAFT SPLIT THICKNESS WITH A-CELL AND VAC TO LEFT THIGH (Left)  SURGEON:  Surgeon(s) and Role:    * Shealee Yordy Sanger, DO - Primary  PHYSICIAN ASSISTANT: none  ASSISTANTS: none   ANESTHESIA:   general  EBL:  Total I/O In: 400 [I.V.:400] Out: -   BLOOD ADMINISTERED:none  DRAINS: none   LOCAL MEDICATIONS USED:  MARCAINE     SPECIMEN:  No Specimen  DISPOSITION OF SPECIMEN:  N/A  COUNTS:  YES  TOURNIQUET:  * No tourniquets in log *  DICTATION: .Dragon Dictation  PLAN OF CARE: Discharge to home after PACU  PATIENT DISPOSITION:  PACU - hemodynamically stable.   Delay start of Pharmacological VTE agent (>24hrs) due to surgical blood loss or risk of bleeding: no

## 2013-03-29 NOTE — Interval H&P Note (Signed)
History and Physical Interval Note:  03/29/2013 2:06 PM  Monica Lee  has presented today for surgery, with the diagnosis of LEFT ANTERIOR THIGH WOUND  The various methods of treatment have been discussed with the patient and family. After consideration of risks, benefits and other options for treatment, the patient has consented to  Procedure(s): SKIN GRAFT SPLIT THICKNESS WITH A-CELL AND VAC (N/A) as a surgical intervention .  The patient's history has been reviewed, patient examined, no change in status, stable for surgery.  I have reviewed the patient's chart and labs.  Questions were answered to the patient's satisfaction.     SANGER,Radek Carnero

## 2013-03-30 NOTE — Telephone Encounter (Signed)
Patti w/[facility] notified ok per Dr Donne Hazel for 3 PRN orders for the wound vac.

## 2013-03-30 NOTE — Telephone Encounter (Signed)
That would be fine 

## 2013-03-31 ENCOUNTER — Encounter (HOSPITAL_BASED_OUTPATIENT_CLINIC_OR_DEPARTMENT_OTHER): Payer: Self-pay | Admitting: Plastic Surgery

## 2013-03-31 DIAGNOSIS — E11319 Type 2 diabetes mellitus with unspecified diabetic retinopathy without macular edema: Secondary | ICD-10-CM | POA: Diagnosis not present

## 2013-03-31 DIAGNOSIS — M726 Necrotizing fasciitis: Secondary | ICD-10-CM | POA: Diagnosis not present

## 2013-03-31 DIAGNOSIS — Z48817 Encounter for surgical aftercare following surgery on the skin and subcutaneous tissue: Secondary | ICD-10-CM | POA: Diagnosis not present

## 2013-03-31 DIAGNOSIS — F489 Nonpsychotic mental disorder, unspecified: Secondary | ICD-10-CM | POA: Diagnosis not present

## 2013-03-31 DIAGNOSIS — E1139 Type 2 diabetes mellitus with other diabetic ophthalmic complication: Secondary | ICD-10-CM | POA: Diagnosis not present

## 2013-03-31 DIAGNOSIS — L02419 Cutaneous abscess of limb, unspecified: Secondary | ICD-10-CM | POA: Diagnosis not present

## 2013-03-31 DIAGNOSIS — E1165 Type 2 diabetes mellitus with hyperglycemia: Secondary | ICD-10-CM | POA: Diagnosis not present

## 2013-04-01 ENCOUNTER — Ambulatory Visit (INDEPENDENT_AMBULATORY_CARE_PROVIDER_SITE_OTHER): Payer: Medicare Other | Admitting: Internal Medicine

## 2013-04-01 ENCOUNTER — Encounter: Payer: Self-pay | Admitting: Internal Medicine

## 2013-04-01 VITALS — BP 112/68 | HR 80 | Temp 97.6°F | Resp 12 | Wt 183.0 lb

## 2013-04-01 DIAGNOSIS — L02419 Cutaneous abscess of limb, unspecified: Secondary | ICD-10-CM | POA: Diagnosis not present

## 2013-04-01 DIAGNOSIS — E1139 Type 2 diabetes mellitus with other diabetic ophthalmic complication: Secondary | ICD-10-CM | POA: Diagnosis not present

## 2013-04-01 DIAGNOSIS — L03119 Cellulitis of unspecified part of limb: Secondary | ICD-10-CM | POA: Diagnosis not present

## 2013-04-01 DIAGNOSIS — E1159 Type 2 diabetes mellitus with other circulatory complications: Secondary | ICD-10-CM

## 2013-04-01 DIAGNOSIS — M726 Necrotizing fasciitis: Secondary | ICD-10-CM | POA: Diagnosis not present

## 2013-04-01 DIAGNOSIS — E11319 Type 2 diabetes mellitus with unspecified diabetic retinopathy without macular edema: Secondary | ICD-10-CM | POA: Diagnosis not present

## 2013-04-01 DIAGNOSIS — E1165 Type 2 diabetes mellitus with hyperglycemia: Secondary | ICD-10-CM | POA: Diagnosis not present

## 2013-04-01 DIAGNOSIS — Z48817 Encounter for surgical aftercare following surgery on the skin and subcutaneous tissue: Secondary | ICD-10-CM | POA: Diagnosis not present

## 2013-04-01 DIAGNOSIS — F489 Nonpsychotic mental disorder, unspecified: Secondary | ICD-10-CM | POA: Diagnosis not present

## 2013-04-01 MED ORDER — INSULIN GLARGINE 100 UNIT/ML ~~LOC~~ SOLN: 45.0000 [IU] | Freq: Every day | SUBCUTANEOUS | Status: DC

## 2013-04-01 MED ORDER — INSULIN ASPART 100 UNIT/ML ~~LOC~~ SOLN: 13.0000 [IU] | Freq: Three times a day (TID) | SUBCUTANEOUS | Status: DC

## 2013-04-01 NOTE — Patient Instructions (Signed)
Please decrease the Lantus from 55 to 45 units. Increase Novolog to 15-15-17 units before breakfast-lunch-dinner. Continue current  Novolog sliding scale: - 150- 160: + 1 unit - 161- 170: + 2 units - 171- 180: + 3 units - 181- 190: + 4 units - >190: + 5 units Please return in 2 months with your sugar log.

## 2013-04-01 NOTE — Progress Notes (Signed)
Patient ID: Monica Lee, female   DOB: 04-22-1950, 63 y.o.   MRN: [id number]  HPI: Monica Lee is a 63 y.o.-year-old woman, returning for f/u for DM2, dx 1980s, insulin-dependent, uncontrolled, with multiple complications (diabetic retinopathy, history of cadaveric kidney transplant in 05/2006 in [place] - now with CKD stage III, gastroparesis, CAD-history of MI in 6073, CHF- diastolic dysfunction, history of TIA and stroke in 2008). Last visit 6 mo ago.   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 She then fell and developed a hematoma in L leg >> vacuum >> surgery.   Last hemoglobin A1c was: Lab Results  Component Value Date   HGBA1C 10.7* 02/14/2013   HGBA1C 9.0* 08/28/2012   HGBA1C 9.1* 04/09/2012  In the past, I was surprised about her hemoglobin A1c result, since it was discordant with her very carefully documented sugar log. I would have expected her hemoglobin A1c to be closer to 7.   She is on Prednisone 10 mg daily for her kidney transplant.   Pt is on a regimen of: - Lantus 55 units - Novolog 13-13-16 - Novolog SSI: target 150, ISF 10  Pt checks her sugars 3-4x a day and they are (reviewed her log). Sugars were higher 2/2 Dexamethasone infusions before last HbA1c.  - am: 59-144 >> 79-160 (most sugars 90-130) >> 57-190, mostly 100-130 >> 75-170, 2 values >200 >> 50 x 1, [phone] - pre-lunch: 55-174 >> 83-190, mostly low 100s >> 63-168, mostly ~100 >> 88-160s >> 114-174, 252 x1 - pre-dinner: 77-197>> 73-176, mostly low 100s >> not checking >> 75-150s  >> 113-160, 199 x1 - bedtime: 81-144 >> 64,150,177 >> 101-198, mostly 100-150 >> 90-160 >> 125, 147, 202  Has lows in am and sometimes during the day; she has hypoglycemia awareness at 80. Highest sugar was 252 in last mo (x1)  - pt has chronic kidney disease, last BUN, creatinine: BUN  Date Value Ref Range Status  03/29/2013 20  6 - 23 mg/dL Final     Creatinine, Ser  Date Value Ref Range Status  03/29/2013  1.30* 0.50 - 1.10 mg/dL Final   BUN/creatinine increased last Cr 2.0 on 07/20/2012- per nephrology.   - Last set of lipids: Lab Results  Component Value Date   CHOL 150 04/09/2012   HDL 39.80 04/09/2012   LDLCALC 68 09/04/2010   LDLDIRECT 70.6 04/09/2012   TRIG 326.0* 04/09/2012   CHOLHDL 4 04/09/2012   - last eye exam was in 12/2012. She had surgeries in the left eye for bleeding. - Denies numbness and tingling in her legs. She sees podiatry.  She also has a history of hypertension, hyperlipidemia, GERD, DJD, headaches, history of nephrolithiasis, history of anemia, cataracts status post surgery, history of hyperthyroidism.   I reviewed pt's medications, allergies, PMH, social hx, family hx and no changes required, except as mentioned above.  ROS: Constitutional: no weight loss, + fatigue, + subjective hyperthermia Eyes: no blurry vision, no xerophthalmia ENT: no sore throat, no nodules palpated in throat, no dysphagia/odynophagia, no hoarseness Cardiovascular: no CP/SOB/palpitations/leg swelling Respiratory: no cough/+ SOB/ + wheezing Gastrointestinal: no N/V/D/C Musculoskeletal: no muscle/joint aches Skin: no rash  PE: BP 112/68  Pulse 80  Temp(Src) 97.6 F (36.4 C) (Oral)  Resp 12  Wt 183 lb (83.008 kg)  SpO2 95% Wt Readings from Last 3 Encounters:  04/01/13 183 lb (83.008 kg)  03/29/13 178 lb (80.74 kg)  03/29/13 178 lb (80.74 kg)   Constitutional:  overweight, in NAD Eyes: surgical left pupil, EOMI, no exophthalmos ENT: moist mucous membranes, no thyromegaly, no cervical lymphadenopathy Cardiovascular: RRR, No MRG Respiratory: CTA B Gastrointestinal: abdomen soft, NT, ND, BS+ Musculoskeletal: no deformities, strength intact in all 4 Skin: moist, warm, vacuum in L upper leg Neurological: no tremor with outstretched hands, DTR normal in all 4  ASSESSMENT: 1. 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 Jimmy Footman - gastroparesis - CAD-history of MI in 2011, last cardiac cath was in 6262 - CHF-diastolic dysfunction, sees Dr. Verl Blalock - history of TIA and stroke in 2008  PLAN:  1. Patient with very long-standing diabetes, worsened recently. She missed a previous appt, last visit 6 mo ago. - We reviewed her CBG log together. She still has lows, for which we need to reduce the Lantus dose. OTW, sugars improved since last HbA1c, which is great. - I advised her to: Patient Instructions  Please decrease the Lantus from 55 to 45 units. Increase Novolog to 15-15-17 units before breakfast-lunch-dinner. Continue current  Novolog sliding scale: - 150- 160: + 1 unit - 161- 170: + 2 units - 171- 180: + 3 units - 181- 190: + 4 units - >190: + 5 units Please return in 2 months with your sugar log.  - will check a hemoglobin A1c at next visit - had flu vaccine in 09/2012 - I will see her back in 2 months with her sugar log

## 2013-04-02 DIAGNOSIS — L02419 Cutaneous abscess of limb, unspecified: Secondary | ICD-10-CM | POA: Diagnosis not present

## 2013-04-02 DIAGNOSIS — E1139 Type 2 diabetes mellitus with other diabetic ophthalmic complication: Secondary | ICD-10-CM | POA: Diagnosis not present

## 2013-04-02 DIAGNOSIS — E11319 Type 2 diabetes mellitus with unspecified diabetic retinopathy without macular edema: Secondary | ICD-10-CM | POA: Diagnosis not present

## 2013-04-02 DIAGNOSIS — L03119 Cellulitis of unspecified part of limb: Secondary | ICD-10-CM | POA: Diagnosis not present

## 2013-04-02 DIAGNOSIS — E1165 Type 2 diabetes mellitus with hyperglycemia: Secondary | ICD-10-CM | POA: Diagnosis not present

## 2013-04-02 DIAGNOSIS — Z48817 Encounter for surgical aftercare following surgery on the skin and subcutaneous tissue: Secondary | ICD-10-CM | POA: Diagnosis not present

## 2013-04-02 DIAGNOSIS — M726 Necrotizing fasciitis: Secondary | ICD-10-CM | POA: Diagnosis not present

## 2013-04-02 DIAGNOSIS — F489 Nonpsychotic mental disorder, unspecified: Secondary | ICD-10-CM | POA: Diagnosis not present

## 2013-04-06 DIAGNOSIS — Z48817 Encounter for surgical aftercare following surgery on the skin and subcutaneous tissue: Secondary | ICD-10-CM | POA: Diagnosis not present

## 2013-04-06 DIAGNOSIS — E1139 Type 2 diabetes mellitus with other diabetic ophthalmic complication: Secondary | ICD-10-CM | POA: Diagnosis not present

## 2013-04-06 DIAGNOSIS — E11319 Type 2 diabetes mellitus with unspecified diabetic retinopathy without macular edema: Secondary | ICD-10-CM | POA: Diagnosis not present

## 2013-04-06 DIAGNOSIS — L02419 Cutaneous abscess of limb, unspecified: Secondary | ICD-10-CM | POA: Diagnosis not present

## 2013-04-06 DIAGNOSIS — E1165 Type 2 diabetes mellitus with hyperglycemia: Secondary | ICD-10-CM | POA: Diagnosis not present

## 2013-04-06 DIAGNOSIS — M726 Necrotizing fasciitis: Secondary | ICD-10-CM | POA: Diagnosis not present

## 2013-04-06 DIAGNOSIS — L03119 Cellulitis of unspecified part of limb: Secondary | ICD-10-CM | POA: Diagnosis not present

## 2013-04-06 DIAGNOSIS — F489 Nonpsychotic mental disorder, unspecified: Secondary | ICD-10-CM | POA: Diagnosis not present

## 2013-04-08 ENCOUNTER — Ambulatory Visit (INDEPENDENT_AMBULATORY_CARE_PROVIDER_SITE_OTHER): Payer: Medicare Other | Admitting: Pulmonary Disease

## 2013-04-08 ENCOUNTER — Encounter: Payer: Self-pay | Admitting: Pulmonary Disease

## 2013-04-08 VITALS — BP 124/88 | HR 74 | Temp 98.1°F | Ht 65.0 in | Wt 176.0 lb

## 2013-04-08 DIAGNOSIS — G4733 Obstructive sleep apnea (adult) (pediatric): Secondary | ICD-10-CM | POA: Insufficient documentation

## 2013-04-08 DIAGNOSIS — G4710 Hypersomnia, unspecified: Secondary | ICD-10-CM | POA: Diagnosis not present

## 2013-04-08 NOTE — Patient Instructions (Signed)
Your sleepiness may be related to sleep apnea Schedule sleep study

## 2013-04-08 NOTE — Progress Notes (Signed)
Subjective:    Patient ID: Monica Lee, female    DOB: [birth year], 63 y.o.   MRN: [id number]  HPI  63 y/o female is referred for evaluation of excessive somnolence. Past medical history of DM, GERD, s/p renal transplant on immunosurpressives, asthma, breast cancer currently being treated, blindness in left eye, chronic diastolic CHF admitted In Feb 2015 for an abscess in her left thigh after a trauma 4 wks ago. - s/p I& D Of the left thigh abscess on 02/18/13 for necrotizing soft tissue infection,now requiring wound VAC. They live in [place], but she is currently living with her son in [place] until her wound issues are resolved. Epworth sleepiness score is 17 Medication review shows lorazepam but she is not sure whether she is taking this or not. Bedtime is around 10 PM, sleep latency is minimal, she sleeps on her back with 2 pillows, reports 2 nocturnal awakenings without me post void sleep latency and is out of bed by 9 AM feeling tired with occasional dryness of mouth. She reports 20 pound weight loss due to her recent acute illness. He was diagnosed with breast cancer in 11/2012 and started chemotherapy with the Port-A-Cath being placed in December 2014 Husband has noted mild snoring but has not witnessed apneas She takes albuterol for occasional dyspnea and is maintained on Imuran and prednisone for immunosuppression.  Past Medical History  Diagnosis Date  . S/P kidney transplant     CADAVERIC --  05/2006  . Hyperlipidemia   . DJD (degenerative joint disease)   . Hx of colonic polyps   . Diabetic retinopathy     RIGHT EYE  . Hypertension   . Asthma   . Blind left eye     OCCLUDED CORNEA,  FUNDI--  FAILED SURGERY REPAIR 2003  . Chronic diastolic CHF (congestive heart failure)     a. Cardiac MRI (10/2012): Moderate to severe LVH, EF 55%, chordal SAM but no LVOT gradient, mod circumferential effusion, mild LAE.  b.  Echocardiogram (11/06/12): Severe LVH, EF 60-65%, grade 1  diastolic dysfunction, moderate effusion.  . Wears glasses   . History of myocardial infarction     2011--  S/P CARDIAC CATH ([place])  NON-OBSTRUCTIVE CAD  . GERD (gastroesophageal reflux disease)   . History of kidney stones   . History of CVA (cerebrovascular accident) without residual deficits     2008  AND TIA IN 2008  W/ NO RESIDUAL  . History of peptic ulcer     2011--  RESOLVED  . 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 Gastroparesis due to DM   . Chronic kidney disease (CKD), stage III (moderate)     NEPHROLOGIST--  DR Freddrick March  . Open thigh wound     ANTERIOR  . Coronary artery disease CARDIOLOGIST--  DR Johnsie Cancel    NON-OBSTRUCTIVE CAD  PER CARDIAC CATH  2011  . Pericardial effusion without cardiac tamponade     PER DR NISHAN  NOTE --  NOT MALIGNANT  ? RELATED TO HYPOTHYROIDISM  . Thyromegaly     MULTINODULER GOITER  . Hyperparathyroidism, secondary renal   . Anemia in chronic kidney disease   . Trigeminal neuralgia   . Hypothyroidism   . At risk for sleep apnea     STOP-BANG= 4  SENT TO PCP  03-25-2013    Past Surgical History  Procedure Laterality Date  . Transplantation renal  05/2006    CADAVERIC  . Breast biopsy  2010  . Cholecystectomy  2008  . Hemorrhoid surgery  01/23/2012    Procedure: HEMORRHOIDECTOMY PROLAPSED;  Surgeon: Leighton Ruff, MD;  Location: [facility];  Service: General;  Laterality: N/A;  . Foot surgery Right 2013  . Breast lumpectomy with sentinel lymph node biopsy Right 11/09/2012    Procedure: RIGHT BREAST LUMPECTOMY WITH SENTINEL LYMPH NODE REMOVAL;  Surgeon: Haywood Lasso, MD;  Location: [facility];  Service: General;  Laterality: Right;  . Portacath placement Right 11/30/2012    Procedure: INSERTION PORT-A-CATH;  Surgeon: Haywood Lasso, MD;  Location: [facility];  Service: General;  Laterality: Right;  . Irrigation and debridement abscess Left 02/14/2013    Procedure: IRRIGATION AND DEBRIDEMENT LEFT THIGH ABSCESS;  Surgeon: Joyice Faster. Cornett, MD;  Location: [facility];  Service: General;  Laterality: Left;  . Incision and drainage of wound Left 02/18/2013    Procedure: IRRIGATION AND DEBRIDEMENT THIGH WOUND;  Surgeon: Rolm Bookbinder, MD;  Location: [facility];  Service: General;  Laterality: Left;  . Cataract extraction w/ intraocular lens  implant, bilateral    . Repair  intestinal perforation surgery  01-31-2009  . Cardiac catheterization  04-12-2009  DR EVELYNE GOUDREAU ([facility] IN [place], [place])    NON-OBSTRUCTIVE CAD/  mLAD 40%,  oLAD 50%,  dCFX 40%,  OM1  40%,  mRCA  &  dRCA  50%/  LVEF 65% /  ELEVATED  LVEDP  . Transthoracic echocardiogram  01-19-2013  DR NISHAN    SEVERE LVH/  EF 25-42%/  GRADE I DIASTOLIC DYSFUNCTION/  MODERATE LAE/  MILD IMPROVEMENT OF MODERATE CIRCUMFERENTIAL INFERIOROLATERAL PERICARDIAL EFFUSION WITH NO TAMPONADE  . Cardiovascular stress test  11-04-2012  DR NISHAN    HIGH RISK NUCLEAR STUDY/  FIXED DEFECT AFFECTING ENTIRE INFEROLATERAL AND ANTEROLATERAL WALL/  MODERATE ISCHEMIA  AT MID/ APICAL ANTEROR AND INFERIOR WALL/  GLOBAL HYPOKINESIS/  LVEF 28% (FELT TO BE FALSE +,  ECHO & cMRI  NORMAL EF AND NORMAL WM)  . Vaginal hysterectomy  1990's    W/  BILATERAL SALPINGOOPHORECTOMY  . Eye surgery Left 2003    REPAIR CORNEA  . Skin split graft Left 03/29/2013    Procedure: SKIN GRAFT SPLIT THICKNESS WITH A-CELL AND VAC TO LEFT THIGH;  Surgeon: Theodoro Kos, DO;  Location: [facility];  Service: Plastics;  Laterality: Left;   Allergies  Allergen Reactions  . Keflex [Cephalexin] Swelling    Swelling to face, chills Tolerates zosyn 02/14/13  . Propoxyphene N-Acetaminophen Hives and Itching    History   Social History  . Marital Status: Married    Spouse Name: N/A    Number of Children: 4  . Years of  Education: N/A   Occupational History  . Disables/[employer] since 2002    Social History Main Topics  . Smoking status: Never Smoker   . Smokeless tobacco: Never Used  . Alcohol Use: No  . Drug Use: No  . Sexual Activity: Yes   Other Topics Concern  . Not on file   Social History Narrative  . No narrative on file    Family History  Problem Relation Age of Onset  . Kidney disease Neg Hx   . Arthritis Other     parent  . Heart disease Other     parent  .  Hypertension Other     parent  . Diabetes Other     parent  . Diabetes Other     grandparent  . Thyroid disease Other     several  . Heart disease Father        Review of Systems neg for any significant sore throat, dysphagia, itching, sneezing, nasal congestion or excess/ purulent secretions, fever, chills, sweats, unintended wt loss, pleuritic or exertional cp, hempoptysis, orthopnea pnd or change in chronic leg swelling. Also denies presyncope, palpitations, heartburn, abdominal pain, nausea, vomiting, diarrhea or change in bowel or urinary habits, dysuria,hematuria, rash, arthralgias, visual complaints, headache, numbness weakness or ataxia.     Objective:   Physical Exam  Gen. Pleasant, well-nourished, chronically ill appearing,in no distress, normal affect, carries wound vac ENT - no lesions, no post nasal drip Neck: No JVD, no thyromegaly, no carotid bruits Lungs: no use of accessory muscles, no dullness to percussion, clear without rales or rhonchi  Cardiovascular: Rhythm regular, heart sounds  normal, no murmurs or gallops, no peripheral edema Abdomen: soft and non-tender, no hepatosplenomegaly, BS normal. Musculoskeletal: No deformities, no cyanosis or clubbing Neuro:  alert, non focal       Assessment & Plan:

## 2013-04-08 NOTE — Assessment & Plan Note (Signed)
Your sleepiness may be related to sleep apnea or may be due to meds/ chronic conditions Schedule sleep study  Given excessive daytime somnolence, narrow pharyngeal exam, witnessed apneas & loud snoring, obstructive sleep apnea is very likely & an overnight polysomnogram will be scheduled as a split study. The pathophysiology of obstructive sleep apnea , it's cardiovascular consequences & modes of treatment including CPAP were discused with the patient in detail & they evidenced understanding.

## 2013-04-09 DIAGNOSIS — F489 Nonpsychotic mental disorder, unspecified: Secondary | ICD-10-CM | POA: Diagnosis not present

## 2013-04-09 DIAGNOSIS — E1139 Type 2 diabetes mellitus with other diabetic ophthalmic complication: Secondary | ICD-10-CM | POA: Diagnosis not present

## 2013-04-09 DIAGNOSIS — M726 Necrotizing fasciitis: Secondary | ICD-10-CM | POA: Diagnosis not present

## 2013-04-09 DIAGNOSIS — Z48817 Encounter for surgical aftercare following surgery on the skin and subcutaneous tissue: Secondary | ICD-10-CM | POA: Diagnosis not present

## 2013-04-09 DIAGNOSIS — E11319 Type 2 diabetes mellitus with unspecified diabetic retinopathy without macular edema: Secondary | ICD-10-CM | POA: Diagnosis not present

## 2013-04-09 DIAGNOSIS — L02419 Cutaneous abscess of limb, unspecified: Secondary | ICD-10-CM | POA: Diagnosis not present

## 2013-04-11 NOTE — Progress Notes (Addendum)
Hematology and Oncology Follow Up Visit  Monica Lee [id number] [birth year] 63 y.o. 04/13/2013 12:35 PM     Principle Diagnosis:Monica Lee 63 y.o. 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     Prior Therapy:  #1 Who felt a mass along the inferior aspect of the right breast at about the 7:00 position. On 10/08/2012 she had a mammogram performed that showed a irregular mass with internal pleomorphic calcifications. Ultrasound showed a suspicious 2.1 x 1.3 x 1.6 cm mass in the inframammary fold. A needle core biopsy performed on the same day was diagnostic for invasive ductal carcinoma with associated DCIS. The tumor was ER negative PR negative and HER-2/neu negative. The proliferation marker Ki-67 was elevated at 50% and the tumor was grade 2.   #2 patient is status post right lumpectomy with sentinel lymph node biopsy. Her final pathology revealed 2.2 cm invasive ductal carcinoma that was grade 3 that was triple negative with a proliferation marker Ki-67 at 56%.   #3 Curative intent adjuvant chemotherapy beginning December 2014 with CMF every 21 days for total of 6 cycles.  After receiving three cycles of CMF the patient developed a leg wound and we are waiting for it to heal before restarting CMF therapy  Current therapy:  CMF on hold due to leg wound  Interim History: Monica Lee 63 y.o. female with stage IIA triple negative invasive ductal carcinoma of the right breast.  She is doing well today.  She had her surgery on 03/29/13 with skin grafting.  She tells me that her wounds are healing well.  She denies fevers, chills, or any other concerns.  She is following closely with Dr. Migdalia Dk.  A 10 point ROS is negative.    Medications:  Current Outpatient Prescriptions  Medication Sig Dispense Refill  . amLODipine (NORVASC) 10 MG tablet Take 10 mg by mouth every morning.      Marland Kitchen aspirin EC 325 MG tablet Take 325 mg by mouth daily.        Marland Kitchen  azaTHIOprine (IMURAN) 50 MG tablet Take 50 mg by mouth 2 (two) times daily.        . brimonidine (ALPHAGAN) 0.2 % ophthalmic solution Place 1 drop into the right eye 2 (two) times daily.       . cinacalcet (SENSIPAR) 30 MG tablet Take 30 mg by mouth every morning.      . docusate sodium (COLACE) 100 MG capsule Take 1 capsule (100 mg total) by mouth 2 (two) times daily.  60 capsule  0  . esomeprazole (NEXIUM) 40 MG capsule Take 40 mg by mouth daily before breakfast.      . feeding supplement, GLUCERNA SHAKE, (GLUCERNA SHAKE) LIQD Take 237 mLs by mouth 2 (two) times daily between meals.    0  . furosemide (LASIX) 80 MG tablet Take 160 mg by mouth 2 (two) times daily.       . insulin aspart (NOVOLOG) 100 UNIT/ML injection Inject 13-16 Units into the skin 3 (three) times daily. Inject 15 units daily at breakfast, 15 units daily at lunch, and 17 units daily at supper.  20 mL  3  . insulin glargine (LANTUS) 100 UNIT/ML injection Inject 0.45 mLs (45 Units total) into the skin at bedtime.  20 mL  3  . isosorbide mononitrate (IMDUR) 60 MG 24 hr tablet Take 60 mg by mouth every morning.      Marland Kitchen KLOR-CON M20 20 MEQ tablet Takes  1/2 tab in morning and 1/2 at night.      . levothyroxine (SYNTHROID, LEVOTHROID) 75 MCG tablet Take 1 tablet (75 mcg total) by mouth daily.  90 tablet  3  . lidocaine-prilocaine (EMLA) cream Apply 1 application topically as needed (access port for chemo). Use only on tuesdays and Thursday with chemo      . Magnesium Oxide 250 MG TABS Take 250 mg by mouth daily.       . metoprolol tartrate (LOPRESSOR) 25 MG tablet Take 1 tablet (25 mg total) by mouth 2 (two) times daily.  60 tablet  1  . minoxidil (LONITEN) 2.5 MG tablet Take 2.5 mg by mouth 2 (two) times daily.      . polyethylene glycol (MIRALAX / GLYCOLAX) packet Take 17 g by mouth daily.  14 each  11  . pravastatin (PRAVACHOL) 20 MG tablet Take 10 mg by mouth at bedtime.        . prednisoLONE acetate (PRED FORTE) 1 % ophthalmic  suspension Place 1 drop into the left eye 2 (two) times daily.       . predniSONE (DELTASONE) 10 MG tablet Take 10 mg by mouth every morning.       . tacrolimus (PROGRAF) 1 MG capsule Take 9 mg by mouth 2 (two) times daily.       . timolol (TIMOPTIC) 0.25 % ophthalmic solution Place 1 drop into the right eye 2 (two) times daily.      Marland Kitchen albuterol (ACCUNEB) 0.63 MG/3ML nebulizer solution Take 1 ampule by nebulization every 6 (six) hours as needed for wheezing.      Marland Kitchen albuterol (PROVENTIL HFA;VENTOLIN HFA) 108 (90 BASE) MCG/ACT inhaler Inhale 2 puffs into the lungs every 6 (six) hours as needed for wheezing or shortness of breath.      Marland Kitchen LORazepam (ATIVAN) 0.5 MG tablet Take 0.5 mg by mouth every 6 (six) hours as needed.      . nitroGLYCERIN (NITROSTAT) 0.4 MG SL tablet Place 0.4 mg under the tongue every 5 (five) minutes as needed for chest pain.      Marland Kitchen prochlorperazine (COMPAZINE) 10 MG tablet Take 10 mg by mouth every 6 (six) hours as needed for nausea or vomiting.       No current facility-administered medications for this visit.     Allergies:  Allergies  Allergen Reactions  . Keflex [Cephalexin] Swelling    Swelling to face, chills Tolerates zosyn 02/14/13  . Propoxyphene N-Acetaminophen Hives and Itching    Medical History: Past Medical History  Diagnosis Date  . S/P kidney transplant     CADAVERIC --  05/2006  . Hyperlipidemia   . DJD (degenerative joint disease)   . Hx of colonic polyps   . Diabetic retinopathy     RIGHT EYE  . Hypertension   . Asthma   . Blind left eye     OCCLUDED CORNEA,  FUNDI--  FAILED SURGERY REPAIR 2003  . Chronic diastolic CHF (congestive heart failure)     a. Cardiac MRI (10/2012): Moderate to severe LVH, EF 55%, chordal SAM but no LVOT gradient, mod circumferential effusion, mild LAE.  b.  Echocardiogram (11/06/12): Severe LVH, EF 89-37%, grade 1 diastolic dysfunction, moderate effusion.  . Wears glasses   . History of myocardial infarction      2011--  S/P CARDIAC CATH ([place], [place])  NON-OBSTRUCTIVE CAD  . GERD (gastroesophageal reflux disease)   . History of kidney stones   . History of CVA (cerebrovascular accident)  without residual deficits     2008  AND TIA IN 2008  W/ NO RESIDUAL  . History of peptic ulcer     2011--  RESOLVED  . 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 Gastroparesis due to DM   . Chronic kidney disease (CKD), stage III (moderate)     NEPHROLOGIST--  DR Freddrick March  . Open thigh wound     ANTERIOR  . Coronary artery disease CARDIOLOGIST--  DR Johnsie Cancel    NON-OBSTRUCTIVE CAD  PER CARDIAC CATH  2011  . Pericardial effusion without cardiac tamponade     PER DR NISHAN  NOTE --  NOT MALIGNANT  ? RELATED TO HYPOTHYROIDISM  . Thyromegaly     MULTINODULER GOITER  . Hyperparathyroidism, secondary renal   . Anemia in chronic kidney disease   . Trigeminal neuralgia   . Hypothyroidism   . At risk for sleep apnea     STOP-BANG= 4   SENT TO PCP  03-25-2013    Surgical History:  Past Surgical History  Procedure Laterality Date  . Transplantation renal  05/2006    CADAVERIC  . Breast biopsy  2010  . Cholecystectomy  2008  . Hemorrhoid surgery  01/23/2012    Procedure: HEMORRHOIDECTOMY PROLAPSED;  Surgeon: Leighton Ruff, MD;  Location: [facility];  Service: General;  Laterality: N/A;  . Foot surgery Right 2013  . Breast lumpectomy with sentinel lymph node biopsy Right 11/09/2012    Procedure: RIGHT BREAST LUMPECTOMY WITH SENTINEL LYMPH NODE REMOVAL;  Surgeon: Haywood Lasso, MD;  Location: [facility];  Service: General;  Laterality: Right;  . Portacath placement Right 11/30/2012    Procedure: INSERTION PORT-A-CATH;  Surgeon: Haywood Lasso, MD;  Location: [facility];  Service: General;  Laterality: Right;  . Irrigation and debridement  abscess Left 02/14/2013    Procedure: IRRIGATION AND DEBRIDEMENT LEFT THIGH ABSCESS;  Surgeon: Joyice Faster. Cornett, MD;  Location: [facility];  Service: General;  Laterality: Left;  . Incision and drainage of wound Left 02/18/2013    Procedure: IRRIGATION AND DEBRIDEMENT THIGH WOUND;  Surgeon: Rolm Bookbinder, MD;  Location: [facility];  Service: General;  Laterality: Left;  . Cataract extraction w/ intraocular lens  implant, bilateral    . Repair  intestinal perforation surgery  01-31-2009  . Cardiac catheterization  04-12-2009  DR EVELYNE GOUDREAU ([facility] IN [place], [place])    NON-OBSTRUCTIVE CAD/  mLAD 40%,  oLAD 50%,  dCFX 40%,  OM1  40%,  mRCA  &  dRCA  50%/  LVEF 65% /  ELEVATED  LVEDP  . Transthoracic echocardiogram  01-19-2013  DR NISHAN    SEVERE LVH/  EF 72-53%/  GRADE I DIASTOLIC DYSFUNCTION/  MODERATE LAE/  MILD IMPROVEMENT OF MODERATE CIRCUMFERENTIAL INFERIOROLATERAL PERICARDIAL EFFUSION WITH NO TAMPONADE  . Cardiovascular stress test  11-04-2012  DR NISHAN    HIGH RISK NUCLEAR STUDY/  FIXED DEFECT AFFECTING ENTIRE INFEROLATERAL AND ANTEROLATERAL WALL/  MODERATE ISCHEMIA  AT MID/ APICAL ANTEROR AND INFERIOR WALL/  GLOBAL HYPOKINESIS/  LVEF 28% (FELT TO BE FALSE +,  ECHO & cMRI  NORMAL EF AND NORMAL WM)  . Vaginal hysterectomy  1990's    W/  BILATERAL SALPINGOOPHORECTOMY  . Eye surgery Left 2003    REPAIR CORNEA  . Skin split  graft Left 03/29/2013    Procedure: SKIN GRAFT SPLIT THICKNESS WITH A-CELL AND VAC TO LEFT THIGH;  Surgeon: Theodoro Kos, DO;  Location: [facility];  Service: Plastics;  Laterality: Left;     Review of Systems: A 10 point review of systems was conducted and is otherwise negative except for what is noted above.     Physical Exam: Blood pressure 129/74, pulse 77, temperature 98.5 F (36.9 C), temperature source Oral, resp. rate 18, height '5\' 5"'  (1.651 m), weight 183 lb 9.6 oz (83.28 kg). GENERAL: Patient is a well appearing female in no acute  distress HEENT:  Sclerae anicteric.  Oropharynx clear and moist. No ulcerations or evidence of oropharyngeal candidiasis. Neck is supple.  NODES:  No cervical, supraclavicular, or axillary lymphadenopathy palpated.  BREAST EXAM:  Deferred. LUNGS:  Clear to auscultation bilaterally.  No wheezes or rhonchi. HEART:  Regular rate and rhythm. No murmur appreciated. ABDOMEN:  Soft, nontender.  Positive, normoactive bowel sounds. No organomegaly palpated. MSK:  No focal spinal tenderness to palpation. Full range of motion bilaterally in the upper extremities. EXTREMITIES:  No peripheral edema.   SKIN: bilateral legs with abd covering leg wounds.  I did not completely undress the leg wounds, but took a small look, and from the limited evaluation they appear to be healing NEURO:  Nonfocal. Well oriented.  Appropriate affect. ECOG PERFORMANCE STATUS: 2 - Symptomatic, <50% confined to bed   Lab Results: Lab Results  Component Value Date   WBC 6.5 04/12/2013   HGB 10.3* 04/12/2013   HCT 32.5* 04/12/2013   MCV 90.1 04/12/2013   PLT 237 04/12/2013     Chemistry      Component Value Date/Time   NA 144 04/12/2013 1411   NA 140 03/29/2013 1334   K 4.0 04/12/2013 1411   K 4.0 03/29/2013 1334   CL 98 03/29/2013 1334   CO2 26 04/12/2013 1411   CO2 29 02/19/2013 0520   BUN 15.0 04/12/2013 1411   BUN 20 03/29/2013 1334   CREATININE 0.9 04/12/2013 1411   CREATININE 1.30* 03/29/2013 1334      Component Value Date/Time   CALCIUM 9.5 04/12/2013 1411   CALCIUM 8.2* 02/19/2013 0520   ALKPHOS 64 04/12/2013 1411   ALKPHOS 69 02/15/2013 0317   AST 14 04/12/2013 1411   AST 26 02/15/2013 0317   ALT <6 04/12/2013 1411   ALT 16 02/15/2013 0317   BILITOT 0.32 04/12/2013 1411   BILITOT 0.4 02/15/2013 0317         Assessment and Plan: Monica Lee 63 y.o. female with   1. Stage IIA triple negative invasive ductal carcinoma of the right breast.  The patient underwent right breast lumpectomy that revealed grade 3 invasive  ductal carcinoma measuring 2.2cm.  Sentinel nodes were negative.  She has underwent 3 cycles of adjuvant CMF and we will restart her final 3 cycles of CMF after her legs heal.    2.  Left leg wound.  She is s/p skin grafting and has bandages covering her thighs bilaterally.  She will not receive any further chemotherapy until her legs have completely healed.  She will continue to follow with plastics and surgery regarding this.    The patient will return in 4-6 weeks for labs and evaluation.  She knows to call us if she has any questions or concerns in the interim.    I spent 25 minutes counseling the patient face to face.  The total time spent  in the appointment was 30 minutes.  Minette Headland, [place] [phone] 04/13/2013 12:35 PM   ADDENDUM:    I personally saw this patient and performed a substantive portion of this encounter with the listed APP documented above.   Stage IIa triple negative breast cancer. She received 3 cycles of CMF. Unfortunately she developed cellulitis of her thigh. We have had to hold her chemotherapy until after her legs heal. Patient is continuing to followup with plastics and surgeries regarding her left leg wound.  Patient will be seen back in 4-6 weeks time. I have discussed with her the rationale for holding chemotherapy. She consents. She understands that if we gave her chemotherapy at this point she most likely would have significant side effects including the possibility of having her infection,  Marcy Panning, MD

## 2013-04-12 ENCOUNTER — Other Ambulatory Visit (HOSPITAL_BASED_OUTPATIENT_CLINIC_OR_DEPARTMENT_OTHER): Payer: Medicare Other

## 2013-04-12 ENCOUNTER — Telehealth: Payer: Self-pay | Admitting: Oncology

## 2013-04-12 ENCOUNTER — Ambulatory Visit (HOSPITAL_BASED_OUTPATIENT_CLINIC_OR_DEPARTMENT_OTHER): Payer: Medicare Other | Admitting: Adult Health

## 2013-04-12 VITALS — BP 129/74 | HR 77 | Temp 98.5°F | Resp 18 | Ht 65.0 in | Wt 183.6 lb

## 2013-04-12 DIAGNOSIS — C50519 Malignant neoplasm of lower-outer quadrant of unspecified female breast: Secondary | ICD-10-CM

## 2013-04-12 DIAGNOSIS — L03119 Cellulitis of unspecified part of limb: Secondary | ICD-10-CM | POA: Diagnosis not present

## 2013-04-12 DIAGNOSIS — C50511 Malignant neoplasm of lower-outer quadrant of right female breast: Secondary | ICD-10-CM

## 2013-04-12 DIAGNOSIS — L02419 Cutaneous abscess of limb, unspecified: Secondary | ICD-10-CM | POA: Diagnosis not present

## 2013-04-12 DIAGNOSIS — Z171 Estrogen receptor negative status [ER-]: Secondary | ICD-10-CM | POA: Diagnosis not present

## 2013-04-12 LAB — COMPREHENSIVE METABOLIC PANEL (CC13)
ALK PHOS: 64 U/L (ref 40–150)
ALT: <6 U/L (ref 0–55)
ANION GAP: 10 meq/L (ref 3–11)
AST: 14 U/L (ref 5–34)
Albumin: 3.6 g/dL (ref 3.5–5.0)
BILIRUBIN TOTAL: 0.32 mg/dL (ref 0.20–1.20)
BUN: 15 mg/dL (ref 7.0–26.0)
CO2: 26 mEq/L (ref 22–29)
CREATININE: 0.9 mg/dL (ref 0.6–1.1)
Calcium: 9.5 mg/dL (ref 8.4–10.4)
Chloride: 107 mEq/L (ref 98–109)
GLUCOSE: 137 mg/dL (ref 70–140)
Potassium: 4 mEq/L (ref 3.5–5.1)
SODIUM: 144 meq/L (ref 136–145)
Total Protein: 7.2 g/dL (ref 6.4–8.3)

## 2013-04-12 LAB — CBC WITH DIFFERENTIAL/PLATELET
BASO%: 0.5 % (ref 0.0–2.0)
BASOS ABS: 0 10*3/uL (ref 0.0–0.1)
EOS%: 1 % (ref 0.0–7.0)
Eosinophils Absolute: 0.1 10*3/uL (ref 0.0–0.5)
HCT: 32.5 % — ABNORMAL LOW (ref 34.8–46.6)
HGB: 10.3 g/dL — ABNORMAL LOW (ref 11.6–15.9)
LYMPH%: 15.6 % (ref 14.0–49.7)
MCH: 28.5 pg (ref 25.1–34.0)
MCHC: 31.6 g/dL (ref 31.5–36.0)
MCV: 90.1 fL (ref 79.5–101.0)
MONO#: 0.7 10*3/uL (ref 0.1–0.9)
MONO%: 11.2 % (ref 0.0–14.0)
NEUT#: 4.7 10*3/uL (ref 1.5–6.5)
NEUT%: 71.7 % (ref 38.4–76.8)
Platelets: 237 10*3/uL (ref 145–400)
RBC: 3.6 10*6/uL — AB (ref 3.70–5.45)
RDW: 17.8 % — ABNORMAL HIGH (ref 11.2–14.5)
WBC: 6.5 10*3/uL (ref 3.9–10.3)
lymph#: 1 10*3/uL (ref 0.9–3.3)

## 2013-04-12 NOTE — Telephone Encounter (Signed)
, °

## 2013-04-13 ENCOUNTER — Encounter: Payer: Self-pay | Admitting: Adult Health

## 2013-04-14 DIAGNOSIS — M726 Necrotizing fasciitis: Secondary | ICD-10-CM | POA: Diagnosis not present

## 2013-04-14 DIAGNOSIS — Z48817 Encounter for surgical aftercare following surgery on the skin and subcutaneous tissue: Secondary | ICD-10-CM | POA: Diagnosis not present

## 2013-04-14 DIAGNOSIS — E11319 Type 2 diabetes mellitus with unspecified diabetic retinopathy without macular edema: Secondary | ICD-10-CM | POA: Diagnosis not present

## 2013-04-14 DIAGNOSIS — E1139 Type 2 diabetes mellitus with other diabetic ophthalmic complication: Secondary | ICD-10-CM | POA: Diagnosis not present

## 2013-04-14 DIAGNOSIS — F489 Nonpsychotic mental disorder, unspecified: Secondary | ICD-10-CM | POA: Diagnosis not present

## 2013-04-14 DIAGNOSIS — L02419 Cutaneous abscess of limb, unspecified: Secondary | ICD-10-CM | POA: Diagnosis not present

## 2013-04-14 DIAGNOSIS — L03119 Cellulitis of unspecified part of limb: Secondary | ICD-10-CM | POA: Diagnosis not present

## 2013-04-15 ENCOUNTER — Encounter: Payer: Self-pay | Admitting: Internal Medicine

## 2013-04-19 ENCOUNTER — Encounter (HOSPITAL_BASED_OUTPATIENT_CLINIC_OR_DEPARTMENT_OTHER): Payer: Medicare Other | Attending: Plastic Surgery

## 2013-04-19 DIAGNOSIS — L97809 Non-pressure chronic ulcer of other part of unspecified lower leg with unspecified severity: Secondary | ICD-10-CM | POA: Insufficient documentation

## 2013-04-20 NOTE — Progress Notes (Signed)
[facility]  NAME:  Monica Lee, Monica Lee               ACCOUNT NO.:  [id number]  MEDICAL RECORD NO.:  [id number]      DATE OF BIRTH:  1950/05/18  PHYSICIAN:  Theodoro Kos, DO       VISIT DATE:  04/19/2013                                  OFFICE VISIT   The patient is a 63 year old female, who is here for followup on her left hip wound secondary to trauma.  She underwent debridement with a skin graft placement, and she has at least 80% take of the skin graft. Their is on the lateral most portion a little bit of skin loss with hypergranulation tissue.  We used silver nitrate and Endoform.  We will see her back in 1 week.     Theodoro Kos, DO     CS/MEDQ  D:  04/19/2013  T:  04/20/2013  Job:  [id number]

## 2013-04-26 DIAGNOSIS — Z940 Kidney transplant status: Secondary | ICD-10-CM | POA: Diagnosis not present

## 2013-04-27 NOTE — Progress Notes (Signed)
[facility]  NAME:  Monica Lee, Monica Lee                    ACCOUNT NO.:  MEDICAL RECORD NO.:  [id number]      DATE OF BIRTH:  1950-09-02  PHYSICIAN:  Theodoro Kos, DO       VISIT DATE:  04/26/2013                                  OFFICE VISIT   The patient is a 63 year old female who is here for followup on her left leg ulcer.  She underwent a skin graft and has improved take on the area.  An Endoform was placed last week and seems to have helped.  She has a little bit of hypergranulation tissue on the lateral portion of the leg, this was cleaned up a little today, a little silver nitrate placed, and more Endoform and we will see her back in 1 week.     Theodoro Kos, DO     CS/MEDQ  D:  04/26/2013  T:  04/27/2013  Job:  [id number]

## 2013-05-04 NOTE — Progress Notes (Signed)
[facility]  NAME:  Monica Lee, Monica Lee               ACCOUNT NO.:  [id number]  MEDICAL RECORD NO.:  [id number]      DATE OF BIRTH:  [birth year]  PHYSICIAN:  Theodoro Kos, DO       VISIT DATE:  05/03/2013                                  OFFICE VISIT   The patient is a 63 year old female who is here for followup on her left thigh ulcer secondary to trauma and skin graft.  She had Endoform placed last week and she is doing extremely well.  The area is much smaller and less hypergranulated.  So, today, I did a little silver nitrate, put another piece of Endoform on.  She is to leave this be until Saturday. On Saturday, she can take a bath and do triple antibiotic ointment and then I will see her again on Monday.     Theodoro Kos, DO     CS/MEDQ  D:  05/03/2013  T:  05/04/2013  Job:  [id number]

## 2013-05-06 DIAGNOSIS — M109 Gout, unspecified: Secondary | ICD-10-CM | POA: Diagnosis not present

## 2013-05-06 DIAGNOSIS — IMO0002 Reserved for concepts with insufficient information to code with codable children: Secondary | ICD-10-CM | POA: Diagnosis not present

## 2013-05-06 DIAGNOSIS — M25579 Pain in unspecified ankle and joints of unspecified foot: Secondary | ICD-10-CM | POA: Diagnosis not present

## 2013-05-06 DIAGNOSIS — M2010 Hallux valgus (acquired), unspecified foot: Secondary | ICD-10-CM | POA: Diagnosis not present

## 2013-05-06 DIAGNOSIS — M79609 Pain in unspecified limb: Secondary | ICD-10-CM | POA: Diagnosis not present

## 2013-05-06 DIAGNOSIS — M7150 Other bursitis, not elsewhere classified, unspecified site: Secondary | ICD-10-CM | POA: Diagnosis not present

## 2013-05-11 ENCOUNTER — Other Ambulatory Visit (HOSPITAL_BASED_OUTPATIENT_CLINIC_OR_DEPARTMENT_OTHER): Payer: Medicare Other

## 2013-05-11 ENCOUNTER — Ambulatory Visit (HOSPITAL_BASED_OUTPATIENT_CLINIC_OR_DEPARTMENT_OTHER): Payer: Medicare Other | Admitting: Hematology and Oncology

## 2013-05-11 ENCOUNTER — Telehealth: Payer: Self-pay | Admitting: Hematology and Oncology

## 2013-05-11 ENCOUNTER — Ambulatory Visit: Payer: Medicare Other

## 2013-05-11 VITALS — BP 134/70 | HR 79 | Temp 98.2°F | Resp 18 | Ht 65.0 in | Wt 188.2 lb

## 2013-05-11 DIAGNOSIS — Z171 Estrogen receptor negative status [ER-]: Secondary | ICD-10-CM | POA: Diagnosis not present

## 2013-05-11 DIAGNOSIS — C50519 Malignant neoplasm of lower-outer quadrant of unspecified female breast: Secondary | ICD-10-CM

## 2013-05-11 DIAGNOSIS — H25019 Cortical age-related cataract, unspecified eye: Secondary | ICD-10-CM | POA: Diagnosis not present

## 2013-05-11 DIAGNOSIS — C50511 Malignant neoplasm of lower-outer quadrant of right female breast: Secondary | ICD-10-CM

## 2013-05-11 DIAGNOSIS — C50919 Malignant neoplasm of unspecified site of unspecified female breast: Secondary | ICD-10-CM

## 2013-05-11 DIAGNOSIS — E1139 Type 2 diabetes mellitus with other diabetic ophthalmic complication: Secondary | ICD-10-CM | POA: Diagnosis not present

## 2013-05-11 DIAGNOSIS — E1165 Type 2 diabetes mellitus with hyperglycemia: Secondary | ICD-10-CM | POA: Diagnosis not present

## 2013-05-11 DIAGNOSIS — E11359 Type 2 diabetes mellitus with proliferative diabetic retinopathy without macular edema: Secondary | ICD-10-CM | POA: Diagnosis not present

## 2013-05-11 DIAGNOSIS — H44529 Atrophy of globe, unspecified eye: Secondary | ICD-10-CM | POA: Diagnosis not present

## 2013-05-11 DIAGNOSIS — Z95828 Presence of other vascular implants and grafts: Secondary | ICD-10-CM

## 2013-05-11 LAB — CBC WITH DIFFERENTIAL/PLATELET
BASO%: 0.4 % (ref 0.0–2.0)
Basophils Absolute: 0 10*3/uL (ref 0.0–0.1)
EOS ABS: 0.1 10*3/uL (ref 0.0–0.5)
EOS%: 0.8 % (ref 0.0–7.0)
HEMATOCRIT: 37.6 % (ref 34.8–46.6)
HGB: 11.8 g/dL (ref 11.6–15.9)
LYMPH#: 0.8 10*3/uL — AB (ref 0.9–3.3)
LYMPH%: 12.2 % — AB (ref 14.0–49.7)
MCH: 27.9 pg (ref 25.1–34.0)
MCHC: 31.3 g/dL — ABNORMAL LOW (ref 31.5–36.0)
MCV: 89 fL (ref 79.5–101.0)
MONO#: 0.5 10*3/uL (ref 0.1–0.9)
MONO%: 8.1 % (ref 0.0–14.0)
NEUT%: 78.5 % — ABNORMAL HIGH (ref 38.4–76.8)
NEUTROS ABS: 5.1 10*3/uL (ref 1.5–6.5)
Platelets: 227 10*3/uL (ref 145–400)
RBC: 4.22 10*6/uL (ref 3.70–5.45)
RDW: 17.9 % — ABNORMAL HIGH (ref 11.2–14.5)
WBC: 6.5 10*3/uL (ref 3.9–10.3)

## 2013-05-11 LAB — COMPREHENSIVE METABOLIC PANEL (CC13)
ALT: 6 U/L (ref 0–55)
AST: 14 U/L (ref 5–34)
Albumin: 3.7 g/dL (ref 3.5–5.0)
Alkaline Phosphatase: 50 U/L (ref 40–150)
Anion Gap: 9 mEq/L (ref 3–11)
BILIRUBIN TOTAL: 0.25 mg/dL (ref 0.20–1.20)
BUN: 28.4 mg/dL — ABNORMAL HIGH (ref 7.0–26.0)
CALCIUM: 9.9 mg/dL (ref 8.4–10.4)
CHLORIDE: 104 meq/L (ref 98–109)
CO2: 29 meq/L (ref 22–29)
Creatinine: 1.1 mg/dL (ref 0.6–1.1)
GLUCOSE: 219 mg/dL — AB (ref 70–140)
Potassium: 3.5 mEq/L (ref 3.5–5.1)
SODIUM: 142 meq/L (ref 136–145)
TOTAL PROTEIN: 7.2 g/dL (ref 6.4–8.3)

## 2013-05-11 LAB — HM DIABETES EYE EXAM

## 2013-05-11 MED ORDER — SODIUM CHLORIDE 0.9 % IJ SOLN
10.0000 mL | INTRAMUSCULAR | Status: DC | PRN
  Administered 2013-05-11: 10 mL via INTRAVENOUS
  Filled 2013-05-11: qty 10

## 2013-05-11 MED ORDER — HEPARIN SOD (PORK) LOCK FLUSH 100 UNIT/ML IV SOLN
500.0000 [IU] | Freq: Once | INTRAVENOUS | Status: AC
  Administered 2013-05-11: 500 [IU] via INTRAVENOUS
  Filled 2013-05-11: qty 5

## 2013-05-11 NOTE — Telephone Encounter (Signed)
m °

## 2013-05-11 NOTE — Progress Notes (Signed)
Hematology and Oncology Follow Up Visit  Monica Lee [id number] 18-Aug-1950 63 y.o. 05/11/2013 3:57 PM   Chief complaint: Follow up visit for breast cancer  Principle Diagnosis:Monica Lee 63 y.o. 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     Prior Therapy: As per the previously documented note:  #1 Who felt a mass along the inferior aspect of the right breast at about the 7:00 position. On 10/08/2012 she had a mammogram performed that showed a irregular mass with internal pleomorphic calcifications. Ultrasound showed a suspicious 2.1 x 1.3 x 1.6 cm mass in the inframammary fold. A needle core biopsy performed on the same day was diagnostic for invasive ductal carcinoma with associated DCIS. The tumor was ER negative PR negative and HER-2/neu negative. The proliferation marker Ki-67 was elevated at 50% and the tumor was grade 2.   #2 patient is status post right lumpectomy with sentinel lymph node biopsy. Her final pathology revealed 2.2 cm invasive ductal carcinoma that was grade 3 that was triple negative with a proliferation marker Ki-67 at 56%.   #3 Curative intent adjuvant chemotherapy beginning December 2014 with CMF every 21 days for total of 6 cycles.  After receiving three cycles of CMF the patient developed a leg wound and we are waiting for it to heal before restarting CMF therapy  Current therapy:  CMF on hold due to leg wound  Interim History: Monica Lee 63 y.o. female with stage IIA triple negative invasive ductal carcinoma of the right breast.  She says that she has seen Dr. Migdalia Dk  last week and Right thigh wound well healed . Right breast surgical scar is well healed. She is due to see Dr. Migdalia Dk next week. She denies any fever, shortness of breath, chest pain, any pain in the leg wound, blurred vision, headaches, or decrease in appetite    A 10 point ROS is negative.    Medications:  Current Outpatient Prescriptions   Medication Sig Dispense Refill  . albuterol (ACCUNEB) 0.63 MG/3ML nebulizer solution Take 1 ampule by nebulization every 6 (six) hours as needed for wheezing.      Marland Kitchen albuterol (PROVENTIL HFA;VENTOLIN HFA) 108 (90 BASE) MCG/ACT inhaler Inhale 2 puffs into the lungs every 6 (six) hours as needed for wheezing or shortness of breath.      Marland Kitchen amLODipine (NORVASC) 10 MG tablet Take 10 mg by mouth every morning.      Marland Kitchen aspirin EC 325 MG tablet Take 325 mg by mouth daily.        Marland Kitchen azaTHIOprine (IMURAN) 50 MG tablet Take 50 mg by mouth 2 (two) times daily.        . brimonidine (ALPHAGAN) 0.2 % ophthalmic solution Place 1 drop into the right eye 2 (two) times daily.       . cinacalcet (SENSIPAR) 30 MG tablet Take 30 mg by mouth every morning.      . docusate sodium (COLACE) 100 MG capsule Take 1 capsule (100 mg total) by mouth 2 (two) times daily.  60 capsule  0  . esomeprazole (NEXIUM) 40 MG capsule Take 40 mg by mouth daily before breakfast.      . feeding supplement, GLUCERNA SHAKE, (GLUCERNA SHAKE) LIQD Take 237 mLs by mouth 2 (two) times daily between meals.    0  . furosemide (LASIX) 80 MG tablet Take 160 mg by mouth 2 (two) times daily.       . insulin aspart (NOVOLOG) 100  UNIT/ML injection Inject 13-16 Units into the skin 3 (three) times daily. Inject 15 units daily at breakfast, 15 units daily at lunch, and 17 units daily at supper.  20 mL  3  . insulin glargine (LANTUS) 100 UNIT/ML injection Inject 0.45 mLs (45 Units total) into the skin at bedtime.  20 mL  3  . isosorbide mononitrate (IMDUR) 60 MG 24 hr tablet Take 60 mg by mouth every morning.      Marland Kitchen KLOR-CON M20 20 MEQ tablet Takes 1/2 tab in morning and 1/2 at night.      . levothyroxine (SYNTHROID, LEVOTHROID) 75 MCG tablet Take 1 tablet (75 mcg total) by mouth daily.  90 tablet  3  . lidocaine-prilocaine (EMLA) cream Apply 1 application topically as needed (access port for chemo). Use only on tuesdays and Thursday with chemo      . LORazepam  (ATIVAN) 0.5 MG tablet Take 0.5 mg by mouth every 6 (six) hours as needed.      . Magnesium Oxide 250 MG TABS Take 250 mg by mouth daily.       . metoprolol tartrate (LOPRESSOR) 25 MG tablet Take 1 tablet (25 mg total) by mouth 2 (two) times daily.  60 tablet  1  . minoxidil (LONITEN) 2.5 MG tablet Take 2.5 mg by mouth 2 (two) times daily.      . polyethylene glycol (MIRALAX / GLYCOLAX) packet Take 17 g by mouth daily.  14 each  11  . pravastatin (PRAVACHOL) 20 MG tablet Take 10 mg by mouth at bedtime.        . prednisoLONE acetate (PRED FORTE) 1 % ophthalmic suspension Place 1 drop into the left eye 2 (two) times daily.       . predniSONE (DELTASONE) 10 MG tablet Take 10 mg by mouth every morning.       . prochlorperazine (COMPAZINE) 10 MG tablet Take 10 mg by mouth every 6 (six) hours as needed for nausea or vomiting.      . tacrolimus (PROGRAF) 1 MG capsule Take 9 mg by mouth 2 (two) times daily.       . timolol (TIMOPTIC) 0.25 % ophthalmic solution Place 1 drop into the right eye 2 (two) times daily.      . nitroGLYCERIN (NITROSTAT) 0.4 MG SL tablet Place 0.4 mg under the tongue every 5 (five) minutes as needed for chest pain.       No current facility-administered medications for this visit.   Facility-Administered Medications Ordered in Other Visits  Medication Dose Route Frequency Provider Last Rate Last Dose  . sodium chloride 0.9 % injection 10 mL  10 mL Intravenous PRN Deatra Robinson, MD   10 mL at 05/11/13 1443     Allergies:  Allergies  Allergen Reactions  . Keflex [Cephalexin] Swelling    Swelling to face, chills Tolerates zosyn 02/14/13  . Propoxyphene N-Acetaminophen Hives and Itching    Medical History: Past Medical History  Diagnosis Date  . S/P kidney transplant     CADAVERIC --  05/2006  . Hyperlipidemia   . DJD (degenerative joint disease)   . Hx of colonic polyps   . Diabetic retinopathy     RIGHT EYE  . Hypertension   . Asthma   . Blind left eye      OCCLUDED CORNEA,  FUNDI--  FAILED SURGERY REPAIR 2003  . Chronic diastolic CHF (congestive heart failure)     a. Cardiac MRI (10/2012): Moderate to severe LVH, EF 55%,  chordal SAM but no LVOT gradient, mod circumferential effusion, mild LAE.  b.  Echocardiogram (11/06/12): Severe LVH, EF 42-35%, grade 1 diastolic dysfunction, moderate effusion.  . Wears glasses   . History of myocardial infarction     2011--  S/P CARDIAC CATH ([place])  NON-OBSTRUCTIVE CAD  . GERD (gastroesophageal reflux disease)   . History of kidney stones   . History of CVA (cerebrovascular accident) without residual deficits     2008  AND TIA IN 2008  W/ NO RESIDUAL  . History of peptic ulcer     2011--  RESOLVED  . 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 Gastroparesis due to DM   . Chronic kidney disease (CKD), stage III (moderate)     NEPHROLOGIST--  DR Freddrick March  . Open thigh wound     ANTERIOR  . Coronary artery disease CARDIOLOGIST--  DR Johnsie Cancel    NON-OBSTRUCTIVE CAD  PER CARDIAC CATH  2011  . Pericardial effusion without cardiac tamponade     PER DR NISHAN  NOTE --  NOT MALIGNANT  ? RELATED TO HYPOTHYROIDISM  . Thyromegaly     MULTINODULER GOITER  . Hyperparathyroidism, secondary renal   . Anemia in chronic kidney disease   . Trigeminal neuralgia   . Hypothyroidism   . At risk for sleep apnea     STOP-BANG= 4   SENT TO PCP  03-25-2013    Surgical History:  Past Surgical History  Procedure Laterality Date  . Transplantation renal  05/2006    CADAVERIC  . Breast biopsy  2010  . Cholecystectomy  2008  . Hemorrhoid surgery  01/23/2012    Procedure: HEMORRHOIDECTOMY PROLAPSED;  Surgeon: Leighton Ruff, MD;  Location: [facility];  Service: General;  Laterality: N/A;  . Foot surgery Right 2013  . Breast lumpectomy with sentinel lymph node  biopsy Right 11/09/2012    Procedure: RIGHT BREAST LUMPECTOMY WITH SENTINEL LYMPH NODE REMOVAL;  Surgeon: Haywood Lasso, MD;  Location: [facility];  Service: General;  Laterality: Right;  . Portacath placement Right 11/30/2012    Procedure: INSERTION PORT-A-CATH;  Surgeon: Haywood Lasso, MD;  Location: [place];  Service: General;  Laterality: Right;  . Irrigation and debridement abscess Left 02/14/2013    Procedure: IRRIGATION AND DEBRIDEMENT LEFT THIGH ABSCESS;  Surgeon: Joyice Faster. Cornett, MD;  Location: [place];  Service: General;  Laterality: Left;  . Incision and drainage of wound Left 02/18/2013    Procedure: IRRIGATION AND DEBRIDEMENT THIGH WOUND;  Surgeon: Rolm Bookbinder, MD;  Location: [place];  Service: General;  Laterality: Left;  . Cataract extraction w/ intraocular lens  implant, bilateral    . Repair  intestinal perforation surgery  01-31-2009  . Cardiac catheterization  04-12-2009  DR EVELYNE GOUDREAU ([facility] IN [place], [place])    NON-OBSTRUCTIVE CAD/  mLAD 40%,  oLAD 50%,  dCFX 40%,  OM1  40%,  mRCA  &  dRCA  50%/  LVEF 65% /  ELEVATED  LVEDP  . Transthoracic echocardiogram  01-19-2013  DR NISHAN    SEVERE LVH/  EF 36-14%/  GRADE I DIASTOLIC DYSFUNCTION/  MODERATE LAE/  MILD IMPROVEMENT OF MODERATE CIRCUMFERENTIAL INFERIOROLATERAL PERICARDIAL EFFUSION WITH NO TAMPONADE  . Cardiovascular stress test  11-04-2012  DR NISHAN    HIGH  RISK NUCLEAR STUDY/  FIXED DEFECT AFFECTING ENTIRE INFEROLATERAL AND ANTEROLATERAL WALL/  MODERATE ISCHEMIA  AT MID/ APICAL ANTEROR AND INFERIOR WALL/  GLOBAL HYPOKINESIS/  LVEF 28% (FELT TO BE FALSE +,  ECHO & cMRI  NORMAL EF AND NORMAL WM)  . Vaginal hysterectomy  1990's    W/  BILATERAL SALPINGOOPHORECTOMY  . Eye surgery Left 2003    REPAIR CORNEA  . Skin split graft Left 03/29/2013    Procedure: SKIN GRAFT SPLIT THICKNESS WITH A-CELL AND VAC TO LEFT THIGH;  Surgeon: Theodoro Kos, DO;  Location: [place];  Service:  Plastics;  Laterality: Left;     Review of Systems: A 10 point review of systems was conducted and is otherwise negative except for what is noted above.     Physical Exam: Blood pressure 134/70, pulse 79, temperature 98.2 F (36.8 C), temperature source Oral, resp. rate 18, height '5\' 5"'  (1.651 m), weight 188 lb 3.2 oz (85.367 kg). GENERAL: Patient is a well appearing female in no acute distress HEENT:  Sclerae anicteric.  Oropharynx clear and moist. No ulcerations or evidence of oropharyngeal candidiasis. Neck is supple.  NODES:  No cervical, supraclavicular, or axillary lymphadenopathy palpated.  BREAST EXAM: Right breast surgical scar -well healed. No masses  felt. LUNGS:  Clear to auscultation bilaterally.  No wheezes or rhonchi. HEART:  Regular rate and rhythm. No murmur appreciated. ABDOMEN:  Soft, nontender.  Positive, normoactive bowel sounds. No organomegaly palpated. MSK:  No focal spinal tenderness to palpation. Full range of motion bilaterally in the upper extremities. EXTREMITIES:  No peripheral edema. Left thigh wound is bandaged right thigh wound - well-healed.    SKIN: bilateral legs with abd covering leg wounds.  I did not completely undress the leg wounds, but took a small look, and from the limited evaluation they appear to be healing NEURO:  Nonfocal. Well oriented.  Appropriate affect. ECOG PERFORMANCE STATUS: 2 - Symptomatic, <50% confined to bed   Lab Results: Lab Results  Component Value Date   WBC 6.5 05/11/2013   HGB 11.8 05/11/2013   HCT 37.6 05/11/2013   MCV 89.0 05/11/2013   PLT 227 05/11/2013     Chemistry      Component Value Date/Time   NA 142 05/11/2013 1132   NA 140 03/29/2013 1334   K 3.5 05/11/2013 1132   K 4.0 03/29/2013 1334   CL 98 03/29/2013 1334   CO2 29 05/11/2013 1132   CO2 29 02/19/2013 0520   BUN 28.4* 05/11/2013 1132   BUN 20 03/29/2013 1334   CREATININE 1.1 05/11/2013 1132   CREATININE 1.30* 03/29/2013 1334      Component Value Date/Time    CALCIUM 9.9 05/11/2013 1132   CALCIUM 8.2* 02/19/2013 0520   ALKPHOS 50 05/11/2013 1132   ALKPHOS 69 02/15/2013 0317   AST 14 05/11/2013 1132   AST 26 02/15/2013 0317   ALT 6 05/11/2013 1132   ALT 16 02/15/2013 0317   BILITOT 0.25 05/11/2013 1132   BILITOT 0.4 02/15/2013 0317         Assessment and Plan: Monica Lee 63 y.o. female with   1. Stage IIA triple negative invasive ductal carcinoma of the right breast.  The patient underwent right breast lumpectomy that revealed grade 3 invasive ductal carcinoma measuring 2.2cm.  Sentinel nodes were negative.  She  underwent 3 cycles of adjuvant CMF.    2.  Left leg wound.  She is s/p skin grafting and has bandage covering  her right thigh . Follow with Dr. Migdalia Dk as scheduled next week .   3. I have discussed the M[place]'s case with Dr. Migdalia Dk and possible initiation of chemotherapy . We'll plan for  3 more cycles of chemotherapy with CMF in 2 weeks.   4. follow up in 2 weeks for initiation of chemotherapy, office visit and labs   She knows to call us if she has any questions or concerns in the interim.    I spent 20 minutes counseling the patient face to face.  The total time spent in the appointment was 30 minutes.    Wilmon Arms, M.D.  [place] [phone] 05/11/2013 3:57 PM

## 2013-05-12 ENCOUNTER — Telehealth: Payer: Self-pay | Admitting: *Deleted

## 2013-05-12 NOTE — Telephone Encounter (Signed)
I have tried to schedule chemo but MD is to late for 3hr. Scheduler advised

## 2013-05-16 ENCOUNTER — Ambulatory Visit (HOSPITAL_BASED_OUTPATIENT_CLINIC_OR_DEPARTMENT_OTHER): Payer: Medicare Other | Attending: Pulmonary Disease

## 2013-05-16 VITALS — Ht 65.0 in | Wt 188.0 lb

## 2013-05-16 DIAGNOSIS — G4733 Obstructive sleep apnea (adult) (pediatric): Secondary | ICD-10-CM | POA: Insufficient documentation

## 2013-05-16 DIAGNOSIS — Z9989 Dependence on other enabling machines and devices: Secondary | ICD-10-CM

## 2013-05-16 DIAGNOSIS — G4710 Hypersomnia, unspecified: Secondary | ICD-10-CM

## 2013-05-17 ENCOUNTER — Encounter (HOSPITAL_BASED_OUTPATIENT_CLINIC_OR_DEPARTMENT_OTHER): Payer: Medicare Other | Attending: Plastic Surgery

## 2013-05-17 ENCOUNTER — Telehealth: Payer: Self-pay | Admitting: *Deleted

## 2013-05-17 ENCOUNTER — Telehealth: Payer: Self-pay | Admitting: Oncology

## 2013-05-17 DIAGNOSIS — L97109 Non-pressure chronic ulcer of unspecified thigh with unspecified severity: Secondary | ICD-10-CM | POA: Insufficient documentation

## 2013-05-17 NOTE — Progress Notes (Signed)
[facility]  NAME:  Monica Lee, Monica Lee                    ACCOUNT NO.:  MEDICAL RECORD NO.:  [id number]      DATE OF BIRTH:  23-Dec-1950  PHYSICIAN:  Theodoro Kos, DO       VISIT DATE:  05/10/2013                                  OFFICE VISIT   The patient is a 63 year old female, who is here for followup on her left leg ulcer.  She underwent debridement with skin graft placement and a near 100% take.  She is doing extremely well.  The lateral most area is still open, but the other areas are nearly completely healed, so for this week we will do collagen.  She can take a shower and change it every other day.     Theodoro Kos, DO     CS/MEDQ  D:  05/10/2013  T:  05/11/2013  Job:  [id number]

## 2013-05-17 NOTE — Telephone Encounter (Signed)
, °

## 2013-05-17 NOTE — Telephone Encounter (Signed)
Per staff message and POF I have scheduled appts.  JMW  

## 2013-05-17 NOTE — Progress Notes (Signed)
[facility]  NAME:  Monica Lee, Monica Lee               ACCOUNT NO.:  [id number]  MEDICAL RECORD NO.:  [id number]      DATE OF BIRTH:  1950-07-17  PHYSICIAN:  Theodoro Kos, DO       VISIT DATE:  05/17/2013                                  OFFICE VISIT   The patient is a 63 year old female, who underwent a split-thickness skin graft to her left thigh.  She had very good take on most of it.  We have been putting Endoform on a few areas and she is healing that very well.  There is a small area remaining and an Endoform was placed on that today.  She is to change that every other day and will see her back in 2 weeks.     Theodoro Kos, DO     CS/MEDQ  D:  05/17/2013  T:  05/17/2013  Job:  [id number]

## 2013-05-24 ENCOUNTER — Ambulatory Visit (HOSPITAL_BASED_OUTPATIENT_CLINIC_OR_DEPARTMENT_OTHER): Payer: Medicare Other

## 2013-05-24 ENCOUNTER — Ambulatory Visit (HOSPITAL_BASED_OUTPATIENT_CLINIC_OR_DEPARTMENT_OTHER): Payer: Medicare Other | Admitting: Oncology

## 2013-05-24 ENCOUNTER — Ambulatory Visit: Payer: Medicare Other

## 2013-05-24 ENCOUNTER — Telehealth: Payer: Self-pay | Admitting: *Deleted

## 2013-05-24 ENCOUNTER — Other Ambulatory Visit: Payer: Self-pay | Admitting: *Deleted

## 2013-05-24 ENCOUNTER — Telehealth: Payer: Self-pay | Admitting: Pulmonary Disease

## 2013-05-24 ENCOUNTER — Other Ambulatory Visit: Payer: Medicare Other

## 2013-05-24 ENCOUNTER — Other Ambulatory Visit (HOSPITAL_BASED_OUTPATIENT_CLINIC_OR_DEPARTMENT_OTHER): Payer: Medicare Other

## 2013-05-24 ENCOUNTER — Telehealth: Payer: Self-pay | Admitting: Oncology

## 2013-05-24 VITALS — BP 136/76 | HR 72 | Temp 98.3°F | Resp 18 | Ht 65.0 in | Wt 194.8 lb

## 2013-05-24 DIAGNOSIS — Z171 Estrogen receptor negative status [ER-]: Secondary | ICD-10-CM

## 2013-05-24 DIAGNOSIS — Z5111 Encounter for antineoplastic chemotherapy: Secondary | ICD-10-CM | POA: Diagnosis not present

## 2013-05-24 DIAGNOSIS — C50519 Malignant neoplasm of lower-outer quadrant of unspecified female breast: Secondary | ICD-10-CM

## 2013-05-24 DIAGNOSIS — C50511 Malignant neoplasm of lower-outer quadrant of right female breast: Secondary | ICD-10-CM

## 2013-05-24 DIAGNOSIS — G4733 Obstructive sleep apnea (adult) (pediatric): Secondary | ICD-10-CM

## 2013-05-24 DIAGNOSIS — G4730 Sleep apnea, unspecified: Secondary | ICD-10-CM

## 2013-05-24 DIAGNOSIS — G4710 Hypersomnia, unspecified: Secondary | ICD-10-CM

## 2013-05-24 LAB — CBC WITH DIFFERENTIAL/PLATELET
BASO%: 0.6 % (ref 0.0–2.0)
Basophils Absolute: 0 10*3/uL (ref 0.0–0.1)
EOS%: 2 % (ref 0.0–7.0)
Eosinophils Absolute: 0.1 10*3/uL (ref 0.0–0.5)
HEMATOCRIT: 37.9 % (ref 34.8–46.6)
HGB: 11.9 g/dL (ref 11.6–15.9)
LYMPH#: 0.7 10*3/uL — AB (ref 0.9–3.3)
LYMPH%: 12.7 % — ABNORMAL LOW (ref 14.0–49.7)
MCH: 27.9 pg (ref 25.1–34.0)
MCHC: 31.5 g/dL (ref 31.5–36.0)
MCV: 88.6 fL (ref 79.5–101.0)
MONO#: 0.5 10*3/uL (ref 0.1–0.9)
MONO%: 8.8 % (ref 0.0–14.0)
NEUT#: 4.4 10*3/uL (ref 1.5–6.5)
NEUT%: 75.9 % (ref 38.4–76.8)
Platelets: 209 10*3/uL (ref 145–400)
RBC: 4.27 10*6/uL (ref 3.70–5.45)
RDW: 17.5 % — ABNORMAL HIGH (ref 11.2–14.5)
WBC: 5.8 10*3/uL (ref 3.9–10.3)

## 2013-05-24 LAB — COMPREHENSIVE METABOLIC PANEL (CC13)
ALBUMIN: 3.6 g/dL (ref 3.5–5.0)
ALT: 14 U/L (ref 0–55)
ANION GAP: 9 meq/L (ref 3–11)
AST: 18 U/L (ref 5–34)
Alkaline Phosphatase: 48 U/L (ref 40–150)
BUN: 18.9 mg/dL (ref 7.0–26.0)
CHLORIDE: 105 meq/L (ref 98–109)
CO2: 31 meq/L — AB (ref 22–29)
Calcium: 10 mg/dL (ref 8.4–10.4)
Creatinine: 1.1 mg/dL (ref 0.6–1.1)
Glucose: 193 mg/dl — ABNORMAL HIGH (ref 70–140)
Potassium: 4.1 mEq/L (ref 3.5–5.1)
SODIUM: 144 meq/L (ref 136–145)
TOTAL PROTEIN: 6.7 g/dL (ref 6.4–8.3)
Total Bilirubin: 0.29 mg/dL (ref 0.20–1.20)

## 2013-05-24 MED ORDER — KETOCONAZOLE 2 % EX CREA: 1.0000 "application " | TOPICAL_CREAM | Freq: Every day | CUTANEOUS | Status: DC

## 2013-05-24 MED ORDER — METHOTREXATE SODIUM CHEMO INJECTION 25 MG/ML
40.0000 mg/m2 | Freq: Once | INTRAMUSCULAR | Status: AC
  Administered 2013-05-24: 75 mg via INTRAVENOUS
  Filled 2013-05-24: qty 3

## 2013-05-24 MED ORDER — FLUOROURACIL CHEMO INJECTION 2.5 GM/50ML
600.0000 mg/m2 | Freq: Once | INTRAVENOUS | Status: AC
  Administered 2013-05-24: 1200 mg via INTRAVENOUS
  Filled 2013-05-24: qty 24

## 2013-05-24 MED ORDER — ONDANSETRON 8 MG/NS 50 ML IVPB
INTRAVENOUS | Status: AC
  Filled 2013-05-24: qty 8

## 2013-05-24 MED ORDER — CYCLOPHOSPHAMIDE CHEMO INJECTION 1 GM
600.0000 mg/m2 | Freq: Once | INTRAMUSCULAR | Status: AC
  Administered 2013-05-24: 1220 mg via INTRAVENOUS
  Filled 2013-05-24: qty 61

## 2013-05-24 MED ORDER — HEPARIN SOD (PORK) LOCK FLUSH 100 UNIT/ML IV SOLN
500.0000 [IU] | Freq: Once | INTRAVENOUS | Status: AC | PRN
  Administered 2013-05-24: 500 [IU]
  Filled 2013-05-24: qty 5

## 2013-05-24 MED ORDER — DEXAMETHASONE SODIUM PHOSPHATE 10 MG/ML IJ SOLN
INTRAMUSCULAR | Status: AC
  Filled 2013-05-24: qty 1

## 2013-05-24 MED ORDER — ONDANSETRON 8 MG/50ML IVPB (CHCC)
8.0000 mg | Freq: Once | INTRAVENOUS | Status: AC
  Administered 2013-05-24: 8 mg via INTRAVENOUS

## 2013-05-24 MED ORDER — SODIUM CHLORIDE 0.9 % IV SOLN
Freq: Once | INTRAVENOUS | Status: AC
  Administered 2013-05-24: 13:00:00 via INTRAVENOUS

## 2013-05-24 MED ORDER — DEXAMETHASONE SODIUM PHOSPHATE 10 MG/ML IJ SOLN
10.0000 mg | Freq: Once | INTRAMUSCULAR | Status: AC
  Administered 2013-05-24: 10 mg via INTRAVENOUS

## 2013-05-24 MED ORDER — SODIUM CHLORIDE 0.9 % IJ SOLN
10.0000 mL | INTRAMUSCULAR | Status: DC | PRN
  Administered 2013-05-24: 10 mL
  Filled 2013-05-24: qty 10

## 2013-05-24 NOTE — Addendum Note (Signed)
Addended by: Chauncey Cruel on: 05/24/2013 02:21 PM   Modules accepted: Orders

## 2013-05-24 NOTE — Telephone Encounter (Signed)
[id number] x1 

## 2013-05-24 NOTE — Telephone Encounter (Signed)
Severe,67/h  BiPAP can be initiated at 17/13 centimeters with a small full face mask  Rx sent to [facility] Needs 70mnth FU appt

## 2013-05-24 NOTE — Addendum Note (Signed)
Addended by: Laureen Abrahams on: 05/24/2013 05:27 PM   Modules accepted: Medications

## 2013-05-24 NOTE — Patient Instructions (Signed)
[facility] Discharge Instructions for Patients Receiving Chemotherapy  Today you received the following chemotherapy agents cytoxan/methotrexate/fluorouracil   To help prevent nausea and vomiting after your treatment, we encourage you to take your nausea medication as directed   If you develop nausea and vomiting that is not controlled by your nausea medication, call the clinic.   BELOW ARE SYMPTOMS THAT SHOULD BE REPORTED IMMEDIATELY:  *FEVER GREATER THAN 100.5 F  *CHILLS WITH OR WITHOUT FEVER  NAUSEA AND VOMITING THAT IS NOT CONTROLLED WITH YOUR NAUSEA MEDICATION  *UNUSUAL SHORTNESS OF BREATH  *UNUSUAL BRUISING OR BLEEDING  TENDERNESS IN MOUTH AND THROAT WITH OR WITHOUT PRESENCE OF ULCERS  *URINARY PROBLEMS  *BOWEL PROBLEMS  UNUSUAL RASH Items with * indicate a potential emergency and should be followed up as soon as possible.  Feel free to call the clinic you have any questions or concerns. The clinic phone number is [phone].  

## 2013-05-24 NOTE — Sleep Study (Signed)
[place]  NAME: Monica Lee  DATE OF BIRTH: Apr 29, 1950  MEDICAL RECORD NUMBER [id number]  LOCATION: [facility]  PHYSICIAN: Auset Fritzler V.  DATE OF STUDY: 05/17/2011  SLEEP STUDY TYPE: Split Polysomnogram               REFERRING PHYSICIAN: Rigoberto Noel, MD  INDICATION FOR STUDY: 63 y/o female is referred for  excessive somnolence &s noring.  At the time of this study ,they weighed 188 pounds with a height of  5 ft 5 inches and the BMI of 31, neck size of 17 inches. Epworth sleepiness score was 19   This intervention polysomnogram was performed with a sleep technologist in attendance. EEG, EOG,EMG and respiratory parameters recorded. Sleep stages, arousals, limb movements and respiratory data was scored according to criteria laid out by the American Academy of sleep medicine.  SLEEP ARCHITECTURE: Lights out was at 22-30 PM and lights on was at 5-06 AM. During the baseline portion ,total sleep time was 126 minutes with sleep latency of 13 minutes with latency to REM sleep of 0 minutes and wake after sleep onset of 12 minutes. Sleep efficiency was poor at 83%. Sleep stages as a percentage of total sleep time was N1 -25 %,N2- 75 % and REM sleep 0 % . During titration REM sleep accounted for 66 minutes and supine sleep was noted .The longest period of REM sleep was around 4 AM.   AROUSAL DATA : At baseline ,there were 57 arousals with an arousal index of 27 events per hour. Of these 12 were spontaneous, and 45 were associated with respiratory events and 0 were associated periodic limb movements. During titration, the arousal index was 7.5  events per hour  RESPIRATORY DATA: During the baseline portion,there were 12 obstructive apneas, 0 central apneas, 0 mixed apneas and 128 hypopneas with apnea -hypopnea index of 67 events per hour.  There was no relation to sleep stage or body position. Due to this degree of respiratory disturbance CPAP was  initiated at 4 cm and titrated to find a level of 14 cm due to persistent events during supine sleep. At the level of 14 cm for 25 minutes of sleep, 0 apneas and 6 hypopneas were noted. Due to intolerance of high pressures with CPAP , bilevel was initiated and 16/12 cm and titrated to 17/13 cm .at this find a level CDXXXIV minutes of non-REM sleep, no events or desaturations were noted .Titration was optimal .  MOVEMENT/PARASOMNIA: There were 0 PLMS with a PLM index of 0 events per hour. The PLM arousal index was 0 events per hour. Few PLMS seemed to appear during titration.  OXYGEN DATA: The lowest desaturation was 56 % during non-REM sleep and the desaturation index was 73 per hour. This was corrected by titration and the saturations stayed below 88% for 23 minutes during titration.  CARDIAC DATA: The low heart rate was 32 beats per minute. The high heart rate recorded was an artifact. No arrhythmias were noted   IMPRESSION :  1. severe obstructive sleep apnea with hypopneas causing sleep fragmentation and severe oxygen desaturation. 2. This was corrected by BiPAP of 17/13 centimeters with small full face mask. Titration was optimal. Note that she did not tolerate higher pressures with CPAP and hence BiPAP was used. 3. No evidence of cardiac arrhythmias or behavioral disturbance during sleep. Periodic limb movements were not significant  RECOMMENDATION:    1. The treatment options for this degree  of sleep disordered breathing includes weight loss and BiPAP therapy. 2. BiPAP can be initiated at 17/13 centimeters with a small full face mask and compliance monitored at this level. 3. Patient should be cautioned against driving when sleepy.They should be asked to avoid medications with sedative side effects  Fuad Forget V. Diplomate, [employer] of Sleep Medicine  ELECTRONICALLY SIGNED ON:  05/24/2013  [facility] PH: [phone]   FX: [phone] [place]

## 2013-05-24 NOTE — Telephone Encounter (Signed)
Per staff message and POF I have scheduled appts.  JMW  

## 2013-05-24 NOTE — Progress Notes (Signed)
[place]  [phone] [phone]     ID: Monica Lee OB: 1950/06/04  MR#: [id number]  CSN#:[id number]  PCP: [name], MD GYN:   SUGerald Stabs Streck] OTHER MD: Theodoro Kos, Arloa Koh  CHIEF COMPLAINT: "I think am okay for chemotherapy now"  BREAST CANCER HISTORY: The patient herself palpated a mass in her right breast. She brought it to medical attention at the time of her routinely scheduled mammogram 10/08/2012. This showed an irregular mass in the right breast lower outer quadrant which by ultrasound measured 2.1 cm. Biopsy of the mass was performed the same day, and showed ([id number]) invasive ductal carcinoma, grade 3, triple negative, with an MIB-1 of 56%. She proceeded to right lumpectomy and sentinel lymph node sampling 11/09/2012 with the pathology(SZA 14-04/07/2001 showing invasive ductal carcinoma, grade 3, measuring 2.2 cm, with negative margins, and all 3 sentinel lymph nodes benign. Repeat prognostic panel was again triple negative. The HER-2 signals ratio was 1.38 and the number per cell 2.2.  Her subsequent history is as detailed below  INTERVAL HISTORY: Monica Lee returns today for followup of her breast cancer. To recap: She fell in January and injured her left side, to the extent that she required skin grafting from the right. She had received 3 cycles of CMF prior to that. Subsequent cycles were delayed while waiting for her to heal. She has been followed closely by Dr. Migdalia Dk who has been managing her skin graft. The patient is establishing herself in my service today   REVIEW OF SYSTEMS: She is having significant problems due to pollen and seasonal allergies. She does not have significant pain from the skin graft and she feels it is healing "really well". She denies unusual headaches, visual changes, pain on swallowing or difficulty swallowing, worsening shortness of breath, chest pain or pressure, or change in bowel or  bladder habits. She has a variety of chronic illnesses including arthritis, diabetes, a history of peptic ulcer disease, et Ronney Asters. A detailed review of systems today however was stable   PAST MEDICAL HISTORY: Past Medical History  Diagnosis Date  . S/P kidney transplant     CADAVERIC --  05/2006  . Hyperlipidemia   . DJD (degenerative joint disease)   . Hx of colonic polyps   . Diabetic retinopathy     RIGHT EYE  . Hypertension   . Asthma   . Blind left eye     OCCLUDED CORNEA,  FUNDI--  FAILED SURGERY REPAIR 2003  . Chronic diastolic CHF (congestive heart failure)     a. Cardiac MRI (10/2012): Moderate to severe LVH, EF 55%, chordal SAM but no LVOT gradient, mod circumferential effusion, mild LAE.  b.  Echocardiogram (11/06/12): Severe LVH, EF 26-33%, grade 1 diastolic dysfunction, moderate effusion.  . Wears glasses   . History of myocardial infarction     2011--  S/P CARDIAC CATH ([place])  NON-OBSTRUCTIVE CAD  . GERD (gastroesophageal reflux disease)   . History of kidney stones   . History of CVA (cerebrovascular accident) without residual deficits     2008  AND TIA IN 2008  W/ NO RESIDUAL  . History of peptic ulcer     2011--  RESOLVED  . 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 Gastroparesis due to  DM   . Chronic kidney disease (CKD), stage III (moderate)     NEPHROLOGIST--  DR Freddrick March  . Open thigh wound     ANTERIOR  . Coronary artery disease CARDIOLOGIST--  DR Johnsie Cancel    NON-OBSTRUCTIVE CAD  PER CARDIAC CATH  2011  . Pericardial effusion without cardiac tamponade     PER DR NISHAN  NOTE --  NOT MALIGNANT  ? RELATED TO HYPOTHYROIDISM  . Thyromegaly     MULTINODULER GOITER  . Hyperparathyroidism, secondary renal   . Anemia in chronic kidney disease   . Trigeminal neuralgia   . Hypothyroidism   . At risk for sleep  apnea     STOP-BANG= 4   SENT TO PCP  03-25-2013    PAST SURGICAL HISTORY: Past Surgical History  Procedure Laterality Date  . Transplantation renal  05/2006    CADAVERIC  . Breast biopsy  2010  . Cholecystectomy  2008  . Hemorrhoid surgery  01/23/2012    Procedure: HEMORRHOIDECTOMY PROLAPSED;  Surgeon: Leighton Ruff, MD;  Location: [facility];  Service: General;  Laterality: N/A;  . Foot surgery Right 2013  . Breast lumpectomy with sentinel lymph node biopsy Right 11/09/2012    Procedure: RIGHT BREAST LUMPECTOMY WITH SENTINEL LYMPH NODE REMOVAL;  Surgeon: Haywood Lasso, MD;  Location: [facility];  Service: General;  Laterality: Right;  . Portacath placement Right 11/30/2012    Procedure: INSERTION PORT-A-CATH;  Surgeon: Haywood Lasso, MD;  Location: [facility];  Service: General;  Laterality: Right;  . Irrigation and debridement abscess Left 02/14/2013    Procedure: IRRIGATION AND DEBRIDEMENT LEFT THIGH ABSCESS;  Surgeon: Joyice Faster. Cornett, MD;  Location: [facility];  Service: General;  Laterality: Left;  . Incision and drainage of wound Left 02/18/2013    Procedure: IRRIGATION AND DEBRIDEMENT THIGH WOUND;  Surgeon: Rolm Bookbinder, MD;  Location: [facility];  Service: General;  Laterality: Left;  . Cataract extraction w/ intraocular lens  implant, bilateral    . Repair  intestinal perforation surgery  01-31-2009  . Cardiac catheterization  04-12-2009  DR EVELYNE GOUDREAU ([facility] IN [place], [place])    NON-OBSTRUCTIVE CAD/  mLAD 40%,  oLAD 50%,  dCFX 40%,  OM1  40%,  mRCA  &  dRCA  50%/  LVEF 65% /  ELEVATED  LVEDP  . Transthoracic echocardiogram  01-19-2013  DR NISHAN    SEVERE LVH/  EF 26-83%/  GRADE I DIASTOLIC DYSFUNCTION/  MODERATE LAE/  MILD IMPROVEMENT OF MODERATE CIRCUMFERENTIAL INFERIOROLATERAL PERICARDIAL EFFUSION WITH NO TAMPONADE  . Cardiovascular stress test  11-04-2012  DR NISHAN    HIGH RISK NUCLEAR STUDY/  FIXED DEFECT AFFECTING ENTIRE INFEROLATERAL AND  ANTEROLATERAL WALL/  MODERATE ISCHEMIA  AT MID/ APICAL ANTEROR AND INFERIOR WALL/  GLOBAL HYPOKINESIS/  LVEF 28% (FELT TO BE FALSE +,  ECHO & cMRI  NORMAL EF AND NORMAL WM)  . Vaginal hysterectomy  1990's    W/  BILATERAL SALPINGOOPHORECTOMY  . Eye surgery Left 2003    REPAIR CORNEA  . Skin split graft Left 03/29/2013    Procedure: SKIN GRAFT SPLIT THICKNESS WITH A-CELL AND VAC TO LEFT THIGH;  Surgeon: Theodoro Kos, DO;  Location: [place];  Service: Plastics;  Laterality: Left;    FAMILY HISTORY Family History  Problem Relation Age of Onset  . Kidney disease Neg Hx   . Arthritis Other     parent  . Heart disease Other     parent  .  Hypertension Other     parent  . Diabetes Other     parent  . Diabetes Other     grandparent  . Thyroid disease Other     several  . Heart disease Father   The patient's father died at the age of 45 from a myocardial infarction. The patient's mother died in childbirth. The patient had 2 brothers, one sister. There is no history of breast or ovarian cancer in the family to her knowledge.  GYNECOLOGIC HISTORY:  Menarche age 34, first live birth age 49. The patient is GX P4. Gender 1 hysterectomy without salpingo-oophorectomy in the early 1990s. She did not take hormone replacement.   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 Her husband Wille Glaser used to work in Architect. He is also retired. It's just the 2 of them at home. She has 4 grandchildren. She attends a local [employer] DIRECT[employer]ES:    HEALTH MAINTENANCE: History  Substance Use Topics  . Smoking status: Never Smoker   . Smokeless tobacco: Never Used  . Alcohol Use: No     Colonoscopy:  PAP:  Bone density:  Lipid panel:  Allergies  Allergen Reactions  . Keflex [Cephalexin] Swelling    Swelling to face, chills Tolerates zosyn 02/14/13  . Propoxyphene N-Acetaminophen Hives and Itching    Current Outpatient Prescriptions    Medication Sig Dispense Refill  . albuterol (ACCUNEB) 0.63 MG/3ML nebulizer solution Take 1 ampule by nebulization every 6 (six) hours as needed for wheezing.      Marland Kitchen albuterol (PROVENTIL HFA;VENTOLIN HFA) 108 (90 BASE) MCG/ACT inhaler Inhale 2 puffs into the lungs every 6 (six) hours as needed for wheezing or shortness of breath.      Marland Kitchen amLODipine (NORVASC) 10 MG tablet Take 10 mg by mouth every morning.      Marland Kitchen aspirin EC 325 MG tablet Take 325 mg by mouth daily.        Marland Kitchen azaTHIOprine (IMURAN) 50 MG tablet Take 50 mg by mouth 2 (two) times daily.        . brimonidine (ALPHAGAN) 0.2 % ophthalmic solution Place 1 drop into the right eye 2 (two) times daily.       . cinacalcet (SENSIPAR) 30 MG tablet Take 30 mg by mouth every morning.      . docusate sodium (COLACE) 100 MG capsule Take 1 capsule (100 mg total) by mouth 2 (two) times daily.  60 capsule  0  . esomeprazole (NEXIUM) 40 MG capsule Take 40 mg by mouth daily before breakfast.      . feeding supplement, GLUCERNA SHAKE, (GLUCERNA SHAKE) LIQD Take 237 mLs by mouth 2 (two) times daily between meals.    0  . furosemide (LASIX) 80 MG tablet Take 160 mg by mouth 2 (two) times daily.       . insulin aspart (NOVOLOG) 100 UNIT/ML injection Inject 13-16 Units into the skin 3 (three) times daily. Inject 15 units daily at breakfast, 15 units daily at lunch, and 17 units daily at supper.  20 mL  3  . insulin glargine (LANTUS) 100 UNIT/ML injection Inject 0.45 mLs (45 Units total) into the skin at bedtime.  20 mL  3  . isosorbide mononitrate (IMDUR) 60 MG 24 hr tablet Take 60 mg by mouth every morning.      Marland Kitchen KLOR-CON M20 20 MEQ tablet Takes 1/2 tab in morning and 1/2 at night.      Marland Kitchen  levothyroxine (SYNTHROID, LEVOTHROID) 75 MCG tablet Take 1 tablet (75 mcg total) by mouth daily.  90 tablet  3  . lidocaine-prilocaine (EMLA) cream Apply 1 application topically as needed (access port for chemo). Use only on tuesdays and Thursday with chemo      . LORazepam  (ATIVAN) 0.5 MG tablet Take 0.5 mg by mouth every 6 (six) hours as needed.      . Magnesium Oxide 250 MG TABS Take 250 mg by mouth daily.       . metoprolol tartrate (LOPRESSOR) 25 MG tablet Take 1 tablet (25 mg total) by mouth 2 (two) times daily.  60 tablet  1  . minoxidil (LONITEN) 2.5 MG tablet Take 2.5 mg by mouth 2 (two) times daily.      . nitroGLYCERIN (NITROSTAT) 0.4 MG SL tablet Place 0.4 mg under the tongue every 5 (five) minutes as needed for chest pain.      . polyethylene glycol (MIRALAX / GLYCOLAX) packet Take 17 g by mouth daily.  14 each  11  . pravastatin (PRAVACHOL) 20 MG tablet Take 10 mg by mouth at bedtime.        . prednisoLONE acetate (PRED FORTE) 1 % ophthalmic suspension Place 1 drop into the left eye 2 (two) times daily.       . predniSONE (DELTASONE) 10 MG tablet Take 10 mg by mouth every morning.       . prochlorperazine (COMPAZINE) 10 MG tablet Take 10 mg by mouth every 6 (six) hours as needed for nausea or vomiting.      . tacrolimus (PROGRAF) 1 MG capsule Take 9 mg by mouth 2 (two) times daily.       . timolol (TIMOPTIC) 0.25 % ophthalmic solution Place 1 drop into the right eye 2 (two) times daily.       No current facility-administered medications for this visit.    OBJECTIVE: Middle-aged [place] American woman in no acute distress  Filed Vitals:   05/24/13 1104  BP: 136/76  Pulse: 72  Temp: 98.3 F (36.8 C)  Resp: 18     Body mass index is 32.42 kg/(m^2).    ECOG FS:1 - Symptomatic but completely ambulatory  Ocular: Sclerae unicteric, bilateral arcus senilis  Ear-nose-throat: Oropharynx clear and moist  Lymphatic: No cervical or supraclavicular adenopathy Lungs no rales or rhonchi, good excursion bilaterally Heart regular rate and rhythm, no murmur appreciated Abd soft,  obese, nontender, positive bowel sounds MSK no focal spinal tenderness, noupper extremity lymphedema  Neuro: non-focal, well-oriented, appropriate affect Breasts: The right breast  is status post lumpectomy. There is no evidence of local recurrence. The right axilla is benign. Left breast is unremarkable except for [facility] of slight erythema in the inframammary fold suggestive of candida   LAB RESULTS:  CMP     Component Value Date/Time   NA 142 05/11/2013 1132   NA 140 03/29/2013 1334   K 3.5 05/11/2013 1132   K 4.0 03/29/2013 1334   CL 98 03/29/2013 1334   CO2 29 05/11/2013 1132   CO2 29 02/19/2013 0520   GLUCOSE 219* 05/11/2013 1132   GLUCOSE 117* 03/29/2013 1334   BUN 28.4* 05/11/2013 1132   BUN 20 03/29/2013 1334   CREATININE 1.1 05/11/2013 1132   CREATININE 1.30* 03/29/2013 1334   CALCIUM 9.9 05/11/2013 1132   CALCIUM 8.2* 02/19/2013 0520   PROT 7.2 05/11/2013 1132   PROT 6.0 02/15/2013 0317   ALBUMIN 3.7 05/11/2013 1132   ALBUMIN 2.2* 02/15/2013 9449  AST 14 05/11/2013 1132   AST 26 02/15/2013 0317   ALT 6 05/11/2013 1132   ALT 16 02/15/2013 0317   ALKPHOS 50 05/11/2013 1132   ALKPHOS 69 02/15/2013 0317   BILITOT 0.25 05/11/2013 1132   BILITOT 0.4 02/15/2013 0317   GFRNONAA 60* 02/22/2013 1017   GFRAA 69* 02/22/2013 1017    I No results found for this basename: SPEP, UPEP,  kappa and lambda light chains    Lab Results  Component Value Date   WBC 5.8 05/24/2013   NEUTROABS 4.4 05/24/2013   HGB 11.9 05/24/2013   HCT 37.9 05/24/2013   MCV 88.6 05/24/2013   PLT 209 05/24/2013      Chemistry      Component Value Date/Time   NA 142 05/11/2013 1132   NA 140 03/29/2013 1334   K 3.5 05/11/2013 1132   K 4.0 03/29/2013 1334   CL 98 03/29/2013 1334   CO2 29 05/11/2013 1132   CO2 29 02/19/2013 0520   BUN 28.4* 05/11/2013 1132   BUN 20 03/29/2013 1334   CREATININE 1.1 05/11/2013 1132   CREATININE 1.30* 03/29/2013 1334      Component Value Date/Time   CALCIUM 9.9 05/11/2013 1132   CALCIUM 8.2* 02/19/2013 0520   ALKPHOS 50 05/11/2013 1132   ALKPHOS 69 02/15/2013 0317   AST 14 05/11/2013 1132   AST 26 02/15/2013 0317   ALT 6 05/11/2013 1132   ALT 16 02/15/2013 0317   BILITOT 0.25 05/11/2013 1132    BILITOT 0.4 02/15/2013 0317       No results found for this basename: LABCA2    No components found with this basename: LABCA125    No results found for this basename: INR,  in the last 168 hours  Urinalysis    Component Value Date/Time   COLORURINE AMBER* 02/14/2013 0228   APPEARANCEUR CLOUDY* 02/14/2013 0228   LABSPEC 1.023 02/14/2013 0228   PHURINE 5.5 02/14/2013 0228   GLUCOSEU 250* 02/14/2013 0228   GLUCOSEU NEGATIVE 12/05/2011 1027   HGBUR TRACE* 02/14/2013 0228   BILIRUBINUR SMALL* 02/14/2013 0228   KETONESUR 15* 02/14/2013 0228   PROTEINUR 100* 02/14/2013 0228   UROBILINOGEN 1.0 02/14/2013 0228   NITRITE NEGATIVE 02/14/2013 0228   LEUKOCYTESUR NEGATIVE 02/14/2013 0228    STUDIES: No results found.  ASSESSMENT: 63 y.o. Monica Lee, [place] woman status post right lumpectomy and sentinel lymph node sampling 11/09/2012 for a pT2 pN0, stage IIA invasive ductal carcinoma, grade 3, triple negative, with an MIB-1 of 56%  (1) started adjuvant cyclophosphamide, methotrexate and fluorouracil 12-2012, received 3 cycles, then had a fall with injury to her left leg requiring skin grafting, and her subsequent cycles were delayed.  (2) resuming CMF treatment 05/24/2013, to be completed 07/05/2013  (3) adjuvant radiation to follow chemotherapy; the patient is not yet sure if she would prefer to receive radiation in [place], [place], or [place]  PLAN: Monica Lee has healed sufficiently well from her skin graft that she is ready to proceed to chemotherapy today. She tolerated the first 3 cycles well, and should do well with the remaining 3 cycles.  She was surprised today to hear that she would need radiation after her chemotherapy, but I explained that the standard of care for local control. She may receive it here, in [place], or in [place]. She will think about that and let us know. We will be glad to set her up in any one of those locations. Of course once she finishes radiation we  will start seeing her here on  an every 3 month basis for the first 2 years and then at longer intervals  Monica Lee has a good understanding of the overall plan. She agrees with it. She knows a goal of treatment in her cases cure. She will call with any problems that may develop before her next visit.  Chauncey Cruel, MD   05/24/2013 11:12 AM

## 2013-05-25 NOTE — Telephone Encounter (Signed)
lomtcb x2 

## 2013-05-27 NOTE — Telephone Encounter (Signed)
I spoke with patient about results and she verbalized understanding and had no questionss

## 2013-05-27 NOTE — Telephone Encounter (Signed)
Returning Mindy's call.  [phone]

## 2013-05-29 ENCOUNTER — Other Ambulatory Visit: Payer: Self-pay | Admitting: Oncology

## 2013-05-31 DIAGNOSIS — N2581 Secondary hyperparathyroidism of renal origin: Secondary | ICD-10-CM | POA: Diagnosis not present

## 2013-05-31 DIAGNOSIS — Z940 Kidney transplant status: Secondary | ICD-10-CM | POA: Diagnosis not present

## 2013-05-31 DIAGNOSIS — E039 Hypothyroidism, unspecified: Secondary | ICD-10-CM | POA: Diagnosis not present

## 2013-05-31 DIAGNOSIS — L97109 Non-pressure chronic ulcer of unspecified thigh with unspecified severity: Secondary | ICD-10-CM | POA: Diagnosis not present

## 2013-05-31 DIAGNOSIS — I129 Hypertensive chronic kidney disease with stage 1 through stage 4 chronic kidney disease, or unspecified chronic kidney disease: Secondary | ICD-10-CM | POA: Diagnosis not present

## 2013-05-31 DIAGNOSIS — N183 Chronic kidney disease, stage 3 unspecified: Secondary | ICD-10-CM | POA: Diagnosis not present

## 2013-06-01 ENCOUNTER — Telehealth: Payer: Self-pay

## 2013-06-01 ENCOUNTER — Telehealth: Payer: Self-pay | Admitting: Pulmonary Disease

## 2013-06-01 ENCOUNTER — Encounter: Payer: Self-pay | Admitting: Internal Medicine

## 2013-06-01 ENCOUNTER — Ambulatory Visit (INDEPENDENT_AMBULATORY_CARE_PROVIDER_SITE_OTHER): Payer: Medicare Other | Admitting: Internal Medicine

## 2013-06-01 VITALS — BP 120/68 | HR 72 | Temp 97.9°F | Resp 16 | Wt 188.0 lb

## 2013-06-01 DIAGNOSIS — E1159 Type 2 diabetes mellitus with other circulatory complications: Secondary | ICD-10-CM | POA: Diagnosis not present

## 2013-06-01 DIAGNOSIS — C50511 Malignant neoplasm of lower-outer quadrant of right female breast: Secondary | ICD-10-CM

## 2013-06-01 LAB — HEMOGLOBIN A1C: Hgb A1c MFr Bld: 9.5 % — ABNORMAL HIGH (ref 4.6–6.5)

## 2013-06-01 MED ORDER — DEXAMETHASONE 4 MG PO TABS: ORAL_TABLET | ORAL | Status: DC

## 2013-06-01 NOTE — Telephone Encounter (Signed)
Return call from [facility] - dex not dc'd.  Dex was cancelled by hospitalist on discharge from hospital in Feb.    Dex order re-entered for cycle 5 and 6 of chemotherapy.  Pt advised prescription ready for pickup.  Pt voiced understanding.

## 2013-06-01 NOTE — Telephone Encounter (Signed)
Called spoke with Coralyn Mark from cancer center. She called wrong doctor office. Nothing further needed

## 2013-06-01 NOTE — Patient Instructions (Signed)
Please keep Lantus at 45 units at bedtime. Continue Novolog at 15-15-17 units before breakfast-lunch-dinner.  Continue current Novolog sliding scale:  - 150- 160: + 1 unit  - 161- 170: + 2 units  - 171- 180: + 3 units  - 181- 190: + 4 units  - >190: + 5 units Please return in 3 month with your sugar log.  Please stop at the lab.

## 2013-06-01 NOTE — Progress Notes (Signed)
Patient ID: Monica Lee, female   DOB: 12/27/1950, 63 y.o.   MRN: [id number]  HPI: Monica Lee is a 63 y.o.-year-old woman, returning for f/u for DM2, dx 1980s, insulin-dependent, uncontrolled, with multiple complications (diabetic retinopathy, history of cadaveric kidney transplant in 05/2006 in [place] - now with CKD stage III, gastroparesis, CAD-history of MI in 0938, CHF- diastolic dysfunction, history of TIA and stroke in 2008). Last visit 2 mo ago.   She had a sleep study 05/16/2013 >> OSA >> she is in the process of getting a CPAP.  Last hemoglobin A1c was: Lab Results  Component Value Date   HGBA1C 10.7* 02/14/2013   HGBA1C 9.0* 08/28/2012   HGBA1C 9.1* 04/09/2012  Her hemoglobin A1c results are discordant with her very carefully documented sugar logs. I would have expected her hemoglobin A1c to be closer to 7.   She is on Prednisone 10 mg daily for her kidney transplant.   Pt is on a regimen of: - Lantus 55 >> 45 units - Novolog 13-13-16 >> 15-15-17 units - Novolog SSI: target 150, ISF 10  Pt checks her sugars 3-4x a day and they are (no log today): - am: 59-144 >> 79-160 (most sugars 90-130) >> 57-190, mostly 100-130 >> 75-170, 2 values >200 >> 50 x 1, 18-299-371 >> 97-160 - pre-lunch: 55-174 >> 83-190, mostly low 100s >> 63-168, mostly ~100 >> 88-160s >> 114-174, 252 x1 >> 135 - pre-dinner: 77-197>> 73-176, mostly low 100s >> not checking >> 75-150s  >> 113-160, 199 x1 >> 120-130 - bedtime: 81-144 >> 64,150,177 >> 101-198, mostly 100-150 >> 90-160 >> 125, 147, 202 >> 150s  No more lows in am; lowest 97; she has hypoglycemia awareness at 80. Highest sugar was 240 in last mo (x1)  - pt has chronic kidney disease, last BUN, creatinine: BUN  Date Value Ref Range Status  05/24/2013 18.9  7.0 - 26.0 mg/dL Final     Creatinine  Date Value Ref Range Status  05/24/2013 1.1  0.6 - 1.1 mg/dL Final  Seen by nephrology.  - Last set of lipids: Lab Results  Component Value Date   CHOL 150 04/09/2012   HDL 39.80 04/09/2012   LDLCALC 68 09/04/2010   LDLDIRECT 70.6 04/09/2012   TRIG 326.0* 04/09/2012   CHOLHDL 4 04/09/2012   - last eye exam was in 05/12/2013. She had surgeries in the left eye for bleeding. She has cataracts. - Denies numbness and tingling in her legs. She sees podiatry.  She also has a history of hypertension, hyperlipidemia, GERD, DJD, headaches, history of nephrolithiasis, history of anemia, cataracts status post surgery, history of hyperthyroidism. 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 She then fell and developed a hematoma in L leg >> vacuum >> surgery.   I reviewed pt's medications, allergies, PMH, social hx, family hx and no changes required, except as mentioned above.  ROS: Constitutional: no weight loss, no fatigue, + subjective hyperthermia - sweating at night Eyes: no blurry vision, no xerophthalmia ENT: no sore throat, no nodules palpated in throat, no dysphagia/odynophagia, no hoarseness Cardiovascular: no CP/SOB/palpitations/leg swelling Respiratory: no cough/SOB/wheezing Gastrointestinal: no N/V/D/C Musculoskeletal: no muscle/joint aches Skin: no rash  PE: BP 120/68  Pulse 72  Temp(Src) 97.9 F (36.6 C) (Oral)  Resp 16  Wt 188 lb (85.276 kg)  SpO2 95% Wt Readings from Last 3 Encounters:  06/01/13 188 lb (85.276 kg)  05/24/13 194 lb 12.8 oz (88.361 kg)  05/16/13 188  lb (85.276 kg)   Constitutional: overweight, in NAD Eyes: surgical left pupil, EOMI, no exophthalmos ENT: moist mucous membranes, no thyromegaly, no cervical lymphadenopathy Cardiovascular: RRR, No MRG Respiratory: CTA B Gastrointestinal: abdomen soft, NT, ND, BS+ Musculoskeletal: no deformities, strength intact in all 4 Skin: moist, warm Neurological: no tremor with outstretched hands, DTR normal in all 4  ASSESSMENT: 1. DM2, insulin-dependent, uncontrolled, with complications - diabetic retinopathy - history of cadaveric kidney  transplant (05/2006), now with CKD stage III - sees Dr. Jimmy Footman - gastroparesis - CAD-history of MI in 2011, last cardiac cath was in 2952 - CHF-diastolic dysfunction, sees Dr. Verl Blalock - history of TIA and stroke in 2008  PLAN:  1. Patient with very long-standing diabetes, improved recently. No more low CBGs in last 2 mo, but she c/o hunger. Sugars not low during the episodes. - I advised her to: Patient Instructions  Please keep Lantus at 45 units at bedtime. Continue Novolog at 15-15-17 units before breakfast-lunch-dinner.  Continue current Novolog sliding scale:  - 150- 160: + 1 unit  - 161- 170: + 2 units  - 171- 180: + 3 units  - 181- 190: + 4 units  - >190: + 5 units Please return in 3 month with your sugar log.  Please stop at the lab. - will check a hemoglobin A1c today - I will see her back in 3 months with her sugar log  Office Visit on 06/01/2013  Component Date Value Ref Range Status  . Hemoglobin A1C 06/01/2013 9.5* 4.6 - 6.5 % Final   Glycemic Control Guidelines for People with Diabetes:Non Diabetic:  <6%Goal of Therapy: <7%Additional Action Suggested:  >8%   . HM Diabetic Eye Exam 05/11/2013 Retinopathy* No Retinopathy Final   HbA1c improved.

## 2013-06-01 NOTE — Telephone Encounter (Signed)
Let pt know MD [facility] pulmonologist had discontinued the dexamethasone.  However pt has two cycles of chemotherapy to complete.  Pt cannot recall the MD or if/why the dex was dc'd.  Pt requested this office contact [facility].    LM for [facility]'s nurse.

## 2013-06-02 NOTE — Telephone Encounter (Signed)
Refilled by collaborative nurse

## 2013-06-14 ENCOUNTER — Ambulatory Visit (HOSPITAL_BASED_OUTPATIENT_CLINIC_OR_DEPARTMENT_OTHER): Payer: Medicare Other

## 2013-06-14 ENCOUNTER — Encounter (HOSPITAL_BASED_OUTPATIENT_CLINIC_OR_DEPARTMENT_OTHER): Payer: Medicare Other | Attending: Plastic Surgery

## 2013-06-14 ENCOUNTER — Ambulatory Visit (HOSPITAL_BASED_OUTPATIENT_CLINIC_OR_DEPARTMENT_OTHER): Payer: Medicare Other | Admitting: Internal Medicine

## 2013-06-14 ENCOUNTER — Other Ambulatory Visit (HOSPITAL_BASED_OUTPATIENT_CLINIC_OR_DEPARTMENT_OTHER): Payer: Medicare Other

## 2013-06-14 ENCOUNTER — Ambulatory Visit: Payer: Medicare Other | Admitting: Physician Assistant

## 2013-06-14 VITALS — BP 120/57 | HR 72 | Temp 98.5°F | Resp 18 | Ht 65.0 in | Wt 187.3 lb

## 2013-06-14 DIAGNOSIS — Z5111 Encounter for antineoplastic chemotherapy: Secondary | ICD-10-CM

## 2013-06-14 DIAGNOSIS — C50519 Malignant neoplasm of lower-outer quadrant of unspecified female breast: Secondary | ICD-10-CM

## 2013-06-14 DIAGNOSIS — Z945 Skin transplant status: Secondary | ICD-10-CM

## 2013-06-14 DIAGNOSIS — C50511 Malignant neoplasm of lower-outer quadrant of right female breast: Secondary | ICD-10-CM

## 2013-06-14 DIAGNOSIS — L97109 Non-pressure chronic ulcer of unspecified thigh with unspecified severity: Secondary | ICD-10-CM | POA: Insufficient documentation

## 2013-06-14 DIAGNOSIS — Z170 Estrogen receptor positive status [ER+]: Secondary | ICD-10-CM

## 2013-06-14 DIAGNOSIS — Z940 Kidney transplant status: Secondary | ICD-10-CM

## 2013-06-14 LAB — CBC WITH DIFFERENTIAL/PLATELET
BASO%: 0.8 % (ref 0.0–2.0)
Basophils Absolute: 0 10*3/uL (ref 0.0–0.1)
EOS%: 0.6 % (ref 0.0–7.0)
Eosinophils Absolute: 0 10*3/uL (ref 0.0–0.5)
HEMATOCRIT: 40.4 % (ref 34.8–46.6)
HGB: 13 g/dL (ref 11.6–15.9)
LYMPH%: 13.4 % — ABNORMAL LOW (ref 14.0–49.7)
MCH: 27.8 pg (ref 25.1–34.0)
MCHC: 32.1 g/dL (ref 31.5–36.0)
MCV: 86.5 fL (ref 79.5–101.0)
MONO#: 0.9 10*3/uL (ref 0.1–0.9)
MONO%: 18.3 % — AB (ref 0.0–14.0)
NEUT#: 3.2 10*3/uL (ref 1.5–6.5)
NEUT%: 66.9 % (ref 38.4–76.8)
PLATELETS: 219 10*3/uL (ref 145–400)
RBC: 4.67 10*6/uL (ref 3.70–5.45)
RDW: 17.6 % — ABNORMAL HIGH (ref 11.2–14.5)
WBC: 4.8 10*3/uL (ref 3.9–10.3)
lymph#: 0.6 10*3/uL — ABNORMAL LOW (ref 0.9–3.3)

## 2013-06-14 LAB — COMPREHENSIVE METABOLIC PANEL (CC13)
ALK PHOS: 51 U/L (ref 40–150)
ALT: 11 U/L (ref 0–55)
ANION GAP: 14 meq/L — AB (ref 3–11)
AST: 16 U/L (ref 5–34)
Albumin: 3.8 g/dL (ref 3.5–5.0)
BUN: 21.3 mg/dL (ref 7.0–26.0)
CO2: 27 meq/L (ref 22–29)
CREATININE: 1.2 mg/dL — AB (ref 0.6–1.1)
Calcium: 10 mg/dL (ref 8.4–10.4)
Chloride: 102 mEq/L (ref 98–109)
GLUCOSE: 130 mg/dL (ref 70–140)
Potassium: 3.9 mEq/L (ref 3.5–5.1)
Sodium: 142 mEq/L (ref 136–145)
Total Bilirubin: 0.39 mg/dL (ref 0.20–1.20)
Total Protein: 7.3 g/dL (ref 6.4–8.3)

## 2013-06-14 MED ORDER — ONDANSETRON 8 MG/NS 50 ML IVPB
INTRAVENOUS | Status: AC
  Filled 2013-06-14: qty 8

## 2013-06-14 MED ORDER — SODIUM CHLORIDE 0.9 % IV SOLN
Freq: Once | INTRAVENOUS | Status: AC
  Administered 2013-06-14: 13:00:00 via INTRAVENOUS

## 2013-06-14 MED ORDER — SODIUM CHLORIDE 0.9 % IJ SOLN
10.0000 mL | INTRAMUSCULAR | Status: DC | PRN
  Administered 2013-06-14: 10 mL
  Filled 2013-06-14: qty 10

## 2013-06-14 MED ORDER — METHOTREXATE SODIUM CHEMO INJECTION 25 MG/ML
40.0000 mg/m2 | Freq: Once | INTRAMUSCULAR | Status: AC
  Administered 2013-06-14: 75 mg via INTRAVENOUS
  Filled 2013-06-14: qty 3

## 2013-06-14 MED ORDER — FLUOROURACIL CHEMO INJECTION 2.5 GM/50ML
600.0000 mg/m2 | Freq: Once | INTRAVENOUS | Status: AC
  Administered 2013-06-14: 1200 mg via INTRAVENOUS
  Filled 2013-06-14: qty 24

## 2013-06-14 MED ORDER — SODIUM CHLORIDE 0.9 % IV SOLN
600.0000 mg/m2 | Freq: Once | INTRAVENOUS | Status: AC
  Administered 2013-06-14: 1220 mg via INTRAVENOUS
  Filled 2013-06-14: qty 61

## 2013-06-14 MED ORDER — ONDANSETRON 8 MG/50ML IVPB (CHCC)
8.0000 mg | Freq: Once | INTRAVENOUS | Status: AC
  Administered 2013-06-14: 8 mg via INTRAVENOUS

## 2013-06-14 MED ORDER — DEXAMETHASONE SODIUM PHOSPHATE 10 MG/ML IJ SOLN
INTRAMUSCULAR | Status: AC
  Filled 2013-06-14: qty 1

## 2013-06-14 MED ORDER — HEPARIN SOD (PORK) LOCK FLUSH 100 UNIT/ML IV SOLN
500.0000 [IU] | Freq: Once | INTRAVENOUS | Status: AC | PRN
  Administered 2013-06-14: 500 [IU]
  Filled 2013-06-14: qty 5

## 2013-06-14 MED ORDER — DEXAMETHASONE SODIUM PHOSPHATE 10 MG/ML IJ SOLN
10.0000 mg | Freq: Once | INTRAMUSCULAR | Status: AC
Start: 2013-06-14 — End: 2013-06-14
  Administered 2013-06-14: 10 mg via INTRAVENOUS

## 2013-06-14 NOTE — Patient Instructions (Signed)
[facility] Discharge Instructions for Patients Receiving Chemotherapy  Today you received the following chemotherapy agents methotrexate/fluorouracil/cytoxan.    To help prevent nausea and vomiting after your treatment, we encourage you to take your nausea medication as directed.     If you develop nausea and vomiting that is not controlled by your nausea medication, call the clinic.   BELOW ARE SYMPTOMS THAT SHOULD BE REPORTED IMMEDIATELY:  *FEVER GREATER THAN 100.5 F  *CHILLS WITH OR WITHOUT FEVER  NAUSEA AND VOMITING THAT IS NOT CONTROLLED WITH YOUR NAUSEA MEDICATION  *UNUSUAL SHORTNESS OF BREATH  *UNUSUAL BRUISING OR BLEEDING  TENDERNESS IN MOUTH AND THROAT WITH OR WITHOUT PRESENCE OF ULCERS  *URINARY PROBLEMS  *BOWEL PROBLEMS  UNUSUAL RASH Items with * indicate a potential emergency and should be followed up as soon as possible.  Feel free to call the clinic you have any questions or concerns. The clinic phone number is [phone].

## 2013-06-14 NOTE — Progress Notes (Signed)
[facility]  NAME:  Monica Lee, Monica Lee               ACCOUNT NO.:  [id number]  MEDICAL RECORD NO.:  [id number]      DATE OF BIRTH:  Feb 07, 1950  PHYSICIAN:  Theodoro Kos, DO       VISIT DATE:  06/14/2013                                  OFFICE VISIT   The patient is a 63 year old female who is here for followup on her left leg thigh ulcer, she had debridement skin graft and partial take.  The rest of it was covered with Endoform and she has healed.  There is no change in medications or social history.  On exam, she is alert, oriented, cooperative, very pleased.  The area has completely healed. No sign of infection.  She will continue with lavage and keeping it clean and we will see her as needed.     Theodoro Kos, DO     CS/MEDQ  D:  06/14/2013  T:  06/14/2013  Job:  [id number]

## 2013-06-14 NOTE — Progress Notes (Deleted)
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 She brought it to medical attention at the time of her routinely scheduled mammogram 10/08/2012. This showed an irregular mass in the right breast lower outer quadrant which by ultrasound measured 2.1 cm. Biopsy of the mass was performed the same day, and showed ([id number]) invasive ductal carcinoma, grade 3, triple negative, with an MIB-1 of 56%. She proceeded to right lumpectomy and sentinel lymph node sampling 11/09/2012 with the pathology(SZA 14-04/07/2001 showing invasive ductal carcinoma, grade 3, measuring 2.2 cm, with negative margins, and all 3 sentinel lymph nodes benign. Repeat prognostic panel was again triple negative. The HER-2 signals ratio was 1.38 and the number per cell 2.2.  Her subsequent history is as detailed below  INTERVAL HISTORY: Ms. Keithley returns today for followup of her breast cancer. To recap: She fell in January and injured her left side, to the extent that she required skin grafting from the right. She had received 3 cycles of CMF prior to that. Subsequent cycles were delayed while waiting for her to heal. She has been followed closely by Dr. Migdalia Dk who has been managing her skin graft. The patient is establishing herself in my service today   REVIEW OF SYSTEMS: She is having significant  problems due to pollen and seasonal allergies. She does not have significant pain from the skin graft and she feels it is healing "really well". She denies unusual headaches, visual changes, pain on swallowing or difficulty swallowing, worsening shortness of breath, chest pain or pressure, or change in bowel or bladder habits. She has a variety of chronic illnesses including arthritis, diabetes, a history of peptic ulcer disease, et Ronney Asters. A detailed review of systems today however was stable   PAST MEDICAL HISTORY: Past Medical History  Diagnosis Date  . S/P kidney transplant     CADAVERIC --  05/2006  . Hyperlipidemia   . DJD (degenerative joint disease)   . Hx of colonic polyps   . Diabetic retinopathy     RIGHT EYE  . Hypertension   . Asthma   . Blind left eye     OCCLUDED CORNEA,  FUNDI--  FAILED SURGERY REPAIR 2003  . Chronic diastolic CHF (congestive heart failure)     a. Cardiac MRI (10/2012): Moderate to severe LVH, EF 55%, chordal SAM but no LVOT gradient, mod circumferential effusion, mild LAE.  b.  Echocardiogram (11/06/12): Severe LVH, EF 40-08%, grade 1 diastolic dysfunction, moderate effusion.  . Wears glasses   . History of myocardial infarction     2011--  S/P CARDIAC CATH ([place])  NON-OBSTRUCTIVE CAD  . GERD (gastroesophageal reflux disease)   . History of kidney stones   . History of CVA (cerebrovascular accident) without residual deficits     2008  AND TIA IN 2008  W/ NO RESIDUAL  . History of peptic ulcer     2011--  RESOLVED  . 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 Gastroparesis due to DM   . Chronic kidney disease (CKD), stage III (moderate)     NEPHROLOGIST--  DR Freddrick March  . Open thigh wound     ANTERIOR  . Coronary artery disease CARDIOLOGIST--  DR Johnsie Cancel    NON-OBSTRUCTIVE CAD  PER CARDIAC CATH  2011   . Pericardial effusion without cardiac tamponade     PER DR NISHAN  NOTE --  NOT MALIGNANT  ? RELATED TO HYPOTHYROIDISM  . Thyromegaly     MULTINODULER GOITER  . Hyperparathyroidism, secondary renal   . Anemia in chronic kidney disease   . Trigeminal neuralgia   . Hypothyroidism   . At risk for sleep apnea     STOP-BANG= 4   SENT TO PCP  03-25-2013    PAST SURGICAL HISTORY: Past Surgical History  Procedure Laterality Date  . Transplantation renal  05/2006    CADAVERIC  . Breast biopsy  2010  . Cholecystectomy  2008  . Hemorrhoid surgery  01/23/2012    Procedure: HEMORRHOIDECTOMY PROLAPSED;  Surgeon: Leighton Ruff, MD;  Location: [facility];  Service: General;  Laterality: N/A;  . Foot surgery Right 2013  . Breast lumpectomy with sentinel lymph node biopsy Right 11/09/2012    Procedure: RIGHT BREAST LUMPECTOMY WITH SENTINEL LYMPH NODE REMOVAL;  Surgeon: Haywood Lasso, MD;  Location: [place];  Service: General;  Laterality: Right;  . Portacath placement Right 11/30/2012    Procedure: INSERTION PORT-A-CATH;  Surgeon: Haywood Lasso, MD;  Location: [place];  Service: General;  Laterality: Right;  . Irrigation and debridement abscess Left 02/14/2013    Procedure: IRRIGATION AND DEBRIDEMENT LEFT THIGH ABSCESS;  Surgeon: Joyice Faster. Cornett, MD;  Location: [place];  Service: General;  Laterality: Left;  . Incision and drainage of wound Left 02/18/2013    Procedure: IRRIGATION AND DEBRIDEMENT THIGH WOUND;  Surgeon: Rolm Bookbinder, MD;  Location: [place];  Service: General;  Laterality: Left;  . Cataract extraction w/ intraocular lens  implant, bilateral    . Repair  intestinal perforation surgery  01-31-2009  . Cardiac catheterization  04-12-2009  DR EVELYNE GOUDREAU ([facility] IN [place], [place])    NON-OBSTRUCTIVE CAD/  mLAD 40%,  oLAD 50%,  dCFX 40%,  OM1  40%,  mRCA  &  dRCA  50%/  LVEF 65% /  ELEVATED  LVEDP  . Transthoracic echocardiogram  01-19-2013  DR  NISHAN    SEVERE LVH/  EF 33-83%/  GRADE I DIASTOLIC DYSFUNCTION/  MODERATE LAE/  MILD IMPROVEMENT OF MODERATE CIRCUMFERENTIAL INFERIOROLATERAL PERICARDIAL EFFUSION WITH NO TAMPONADE  . Cardiovascular stress test  11-04-2012  DR NISHAN    HIGH RISK NUCLEAR STUDY/  FIXED DEFECT AFFECTING ENTIRE INFEROLATERAL AND ANTEROLATERAL WALL/  MODERATE ISCHEMIA  AT MID/ APICAL ANTEROR AND INFERIOR WALL/  GLOBAL HYPOKINESIS/  LVEF 28% (FELT TO BE FALSE +,  ECHO & cMRI  NORMAL EF AND NORMAL WM)  . Vaginal hysterectomy  1990's    W/  BILATERAL SALPINGOOPHORECTOMY  . Eye surgery Left 2003    REPAIR CORNEA  . Skin split graft Left 03/29/2013    Procedure: SKIN GRAFT SPLIT THICKNESS WITH A-CELL AND VAC TO LEFT THIGH;  Surgeon: Theodoro Kos, DO;  Location: [facility];  Service: Clinical cytogeneticist;  Laterality: Left;    FAMILY HISTORY Family History  Problem Relation Age of Onset  . Kidney disease Neg Hx   . Arthritis Other     parent  . Heart disease Other     parent  . Hypertension Other     parent  . Diabetes Other     parent  . Diabetes Other     grandparent  . Thyroid disease Other     several  . Heart disease Father   The patient's father died at the age of 23 from a myocardial infarction. The patient's mother died in childbirth. The patient had 2 brothers, one sister. There is no history of breast or ovarian cancer in the family to her knowledge.  GYNECOLOGIC HISTORY:  Menarche age 70, first live birth age 29. The patient is GX P4. Gender 1 hysterectomy without salpingo-oophorectomy in the early 1990s. She did not take hormone replacement.   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 Her husband Wille Glaser used to work in Architect. He is also retired. It's just the 2 of them at home. She has 4 grandchildren. She attends a local [employer] DIRECTIVES:    HEALTH MAINTENANCE: History  Substance Use Topics  . Smoking status: Never Smoker   .  Smokeless tobacco: Never Used  . Alcohol Use: No     Colonoscopy:  PAP:  Bone density:  Lipid panel:  Allergies  Allergen Reactions  . Keflex [Cephalexin] Swelling    Swelling to face, chills Tolerates zosyn 02/14/13  . Propoxyphene N-Acetaminophen Hives and Itching    Current Outpatient Prescriptions  Medication Sig Dispense Refill  . albuterol (ACCUNEB) 0.63 MG/3ML nebulizer solution Take 1 ampule by nebulization every 6 (six) hours as needed for wheezing.      Marland Kitchen albuterol (PROVENTIL HFA;VENTOLIN HFA) 108 (90 BASE) MCG/ACT inhaler Inhale 2 puffs into the lungs every 6 (six) hours as needed for wheezing or shortness of breath.      Marland Kitchen amLODipine (NORVASC) 10 MG tablet Take 10 mg by mouth every morning.      Marland Kitchen aspirin EC 325 MG tablet Take 325 mg by mouth daily.        Marland Kitchen azaTHIOprine (IMURAN) 50 MG tablet Take 50 mg by mouth 2 (two) times daily.        . brimonidine (ALPHAGAN) 0.2 % ophthalmic solution Place 1 drop into the right eye 2 (two) times daily.       . cinacalcet (SENSIPAR) 30 MG tablet Take 30 mg by mouth every morning.      Marland Kitchen dexamethasone (DECADRON) 4 MG tablet Take 2 tablets (8 mg total) by mouth 2 (two) times daily with a meal.  Take daily starting the day after chemotherapy for 2 days.  16 tablet  0  . docusate sodium (COLACE) 100 MG capsule Take 1 capsule (100 mg total) by mouth 2 (two) times daily.  60 capsule  0  . esomeprazole (NEXIUM) 40 MG capsule Take 40 mg by mouth daily before breakfast.      . feeding supplement, GLUCERNA SHAKE, (GLUCERNA SHAKE) LIQD Take 237 mLs by mouth 2 (two) times daily between meals.    0  . furosemide (LASIX) 80 MG tablet Take 160 mg by mouth 2 (two) times daily.       . insulin aspart (NOVOLOG) 100 UNIT/ML injection Inject 13-16 Units into the skin 3 (three) times daily. Inject 15  units daily at breakfast, 15 units daily at lunch, and 17 units daily at supper.  20 mL  3  . insulin glargine (LANTUS) 100 UNIT/ML injection Inject 0.45 mLs  (45 Units total) into the skin at bedtime.  20 mL  3  . isosorbide mononitrate (IMDUR) 60 MG 24 hr tablet Take 60 mg by mouth every morning.      Marland Kitchen ketoconazole (NIZORAL) 2 % cream Apply 1 application topically daily.  15 g  0  . KLOR-CON M20 20 MEQ tablet Takes 1/2 tab in morning and 1/2 at night.      . levothyroxine (SYNTHROID, LEVOTHROID) 75 MCG tablet Take 1 tablet (75 mcg total) by mouth daily.  90 tablet  3  . lidocaine-prilocaine (EMLA) cream Apply 1 application topically as needed (access port for chemo). Use only on tuesdays and Thursday with chemo      . LORazepam (ATIVAN) 0.5 MG tablet Take 0.5 mg by mouth every 6 (six) hours as needed.      . Magnesium Oxide 250 MG TABS Take 250 mg by mouth daily.       . metoprolol tartrate (LOPRESSOR) 25 MG tablet Take 1 tablet (25 mg total) by mouth 2 (two) times daily.  60 tablet  1  . minoxidil (LONITEN) 2.5 MG tablet Take 2.5 mg by mouth 2 (two) times daily.      . nitroGLYCERIN (NITROSTAT) 0.4 MG SL tablet Place 0.4 mg under the tongue every 5 (five) minutes as needed for chest pain.      . polyethylene glycol (MIRALAX / GLYCOLAX) packet Take 17 g by mouth daily.  14 each  11  . pravastatin (PRAVACHOL) 20 MG tablet Take 10 mg by mouth at bedtime.        . prednisoLONE acetate (PRED FORTE) 1 % ophthalmic suspension Place 1 drop into the left eye 2 (two) times daily.       . predniSONE (DELTASONE) 10 MG tablet Take 10 mg by mouth every morning.       . prochlorperazine (COMPAZINE) 10 MG tablet Take 10 mg by mouth every 6 (six) hours as needed for nausea or vomiting.      . tacrolimus (PROGRAF) 1 MG capsule Take 9 mg by mouth 2 (two) times daily.       . timolol (TIMOPTIC) 0.25 % ophthalmic solution Place 1 drop into the right eye 2 (two) times daily.       No current facility-administered medications for this visit.    OBJECTIVE: Middle-aged [place] American woman in no acute distress  Filed Vitals:   06/14/13 1036  BP: 120/57  Pulse: 72   Temp: 98.5 F (36.9 C)  Resp: 18     Body mass index is 31.17 kg/(m^2).    ECOG FS:1 - Symptomatic but completely ambulatory  Ocular: Sclerae unicteric, bilateral arcus senilis  Ear-nose-throat: Oropharynx clear and moist  Lymphatic: No cervical or supraclavicular adenopathy Lungs no rales or rhonchi, good excursion bilaterally Heart regular rate and rhythm, no murmur appreciated Abd soft,  obese, nontender, positive bowel sounds MSK no focal spinal tenderness, noupper extremity lymphedema  Neuro: non-focal, well-oriented, appropriate affect Breasts: The right breast is status post lumpectomy. There is no evidence of local recurrence. The right axilla is benign. Left breast is unremarkable except for [facility] of slight erythema in the inframammary fold suggestive of candida   LAB RESULTS:  CMP     Component Value Date/Time   NA 142 06/14/2013 1022   NA 140  03/29/2013 1334   K 3.9 06/14/2013 1022   K 4.0 03/29/2013 1334   CL 98 03/29/2013 1334   CO2 27 06/14/2013 1022   CO2 29 02/19/2013 0520   GLUCOSE 130 06/14/2013 1022   GLUCOSE 117* 03/29/2013 1334   BUN 21.3 06/14/2013 1022   BUN 20 03/29/2013 1334   CREATININE 1.2* 06/14/2013 1022   CREATININE 1.30* 03/29/2013 1334   CALCIUM 10.0 06/14/2013 1022   CALCIUM 8.2* 02/19/2013 0520   PROT 7.3 06/14/2013 1022   PROT 6.0 02/15/2013 0317   ALBUMIN 3.8 06/14/2013 1022   ALBUMIN 2.2* 02/15/2013 0317   AST 16 06/14/2013 1022   AST 26 02/15/2013 0317   ALT 11 06/14/2013 1022   ALT 16 02/15/2013 0317   ALKPHOS 51 06/14/2013 1022   ALKPHOS 69 02/15/2013 0317   BILITOT 0.39 06/14/2013 1022   BILITOT 0.4 02/15/2013 0317   GFRNONAA 60* 02/22/2013 1017   GFRAA 69* 02/22/2013 1017    I No results found for this basename: SPEP,  UPEP,   kappa and lambda light chains    Lab Results  Component Value Date   WBC 4.8 06/14/2013   NEUTROABS 3.2 06/14/2013   HGB 13.0 06/14/2013   HCT 40.4 06/14/2013   MCV 86.5 06/14/2013   PLT 219 06/14/2013      Chemistry      Component Value Date/Time    NA 142 06/14/2013 1022   NA 140 03/29/2013 1334   K 3.9 06/14/2013 1022   K 4.0 03/29/2013 1334   CL 98 03/29/2013 1334   CO2 27 06/14/2013 1022   CO2 29 02/19/2013 0520   BUN 21.3 06/14/2013 1022   BUN 20 03/29/2013 1334   CREATININE 1.2* 06/14/2013 1022   CREATININE 1.30* 03/29/2013 1334      Component Value Date/Time   CALCIUM 10.0 06/14/2013 1022   CALCIUM 8.2* 02/19/2013 0520   ALKPHOS 51 06/14/2013 1022   ALKPHOS 69 02/15/2013 0317   AST 16 06/14/2013 1022   AST 26 02/15/2013 0317   ALT 11 06/14/2013 1022   ALT 16 02/15/2013 0317   BILITOT 0.39 06/14/2013 1022   BILITOT 0.4 02/15/2013 0317       No results found for this basename: LABCA2    No components found with this basename: LABCA125    No results found for this basename: INR,  in the last 168 hours  Urinalysis    Component Value Date/Time   COLORURINE AMBER* 02/14/2013 0228   APPEARANCEUR CLOUDY* 02/14/2013 0228   LABSPEC 1.023 02/14/2013 0228   PHURINE 5.5 02/14/2013 0228   GLUCOSEU 250* 02/14/2013 0228   GLUCOSEU NEGATIVE 12/05/2011 1027   HGBUR TRACE* 02/14/2013 0228   BILIRUBINUR SMALL* 02/14/2013 0228   KETONESUR 15* 02/14/2013 0228   PROTEINUR 100* 02/14/2013 0228   UROBILINOGEN 1.0 02/14/2013 0228   NITRITE NEGATIVE 02/14/2013 0228   LEUKOCYTESUR NEGATIVE 02/14/2013 0228    STUDIES: No results found.  ASSESSMENT: 63 y.o. [place], [place] woman status post right lumpectomy and sentinel lymph node sampling 11/09/2012 for a pT2 pN0, stage IIA invasive ductal carcinoma, grade 3, triple negative, with an MIB-1 of 56%  (1) started adjuvant cyclophosphamide, methotrexate and fluorouracil 12-2012, received 3 cycles, then had a fall with injury to her left leg requiring skin grafting, and her subsequent cycles were delayed.  (2) resuming CMF treatment 05/24/2013, to be completed 07/05/2013  (3) adjuvant radiation to follow chemotherapy; the patient is not yet sure if she would prefer to receive  radiation in [place], [place], or  [place]  PLAN: Sherill has healed sufficiently well from her skin graft that she is ready to proceed to chemotherapy today. She tolerated the first 3 cycles well, and should do well with the remaining 3 cycles.  She was surprised today to hear that she would need radiation after her chemotherapy, but I explained that the standard of care for local control. She may receive it here, in [place], or in [place]. She will think about that and let us know. We will be glad to set her up in any one of those locations. Of course once she finishes radiation we will start seeing her here on an every 3 month basis for the first 2 years and then at longer intervals  Reyanna has a good understanding of the overall plan. She agrees with it. She knows a goal of treatment in her cases cure. She will call with any problems that may develop before her next visit.  Doristine Church, MD   06/14/2013 11:51 AM

## 2013-06-15 NOTE — Progress Notes (Signed)
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 She brought it to medical attention at the time of her routinely scheduled mammogram 10/08/2012. This showed an irregular mass in the right breast lower outer quadrant which by ultrasound measured 2.1 cm. Biopsy of the mass was performed the same day, and showed ([id number]) invasive ductal carcinoma, grade 3, triple negative, with an MIB-1 of 56%. She proceeded to right lumpectomy and sentinel lymph node sampling 11/09/2012 with the pathology([id number]/07/2001 showing invasive ductal carcinoma, grade 3, measuring 2.2 cm, with negative margins, and all 3 sentinel lymph nodes benign. Repeat prognostic panel was again triple negative. The HER-2 signals ratio was 1.38 and the number per cell 2.2.  Her subsequent history is as detailed below  INTERVAL HISTORY: Monica Lee returns today for followup of her breast cancer. Skin graft on her left lateral thigh has healed, and there is no open wound.She had fallen in January and injured her left side of her left thigh, to the extent that she required skin grafting from the right. She informs me that her surgeon "dismissed her" today. She is here for her fifth cycle of chemotherapy. She has previously tolerated treatment very well. She did have a delay in treatment between cycle 3 of  cycle 4 for her skin grafting surgery and recovery.  She maintains that she is feeling well. She denies any problems with bowel movement or urine function.  ROS:  Negative for respiratory, positive for skin, negative for neurology, negative for cardiovascular, negative for gastroenterology, negative for endocrine, negative for ID, negative for genitourinary, negative for mental health, negative for constitutional symptoms, negative for musculoskeletal  PAST MEDICAL HISTORY: Past Medical History  Diagnosis Date  . S/P kidney transplant     CADAVERIC --  05/2006  . Hyperlipidemia   . DJD (degenerative joint disease)   . Hx of colonic polyps   . Diabetic retinopathy     RIGHT EYE  . Hypertension   . Asthma   . Blind left eye     OCCLUDED CORNEA,  FUNDI--  FAILED SURGERY REPAIR 2003  . Chronic diastolic CHF (congestive heart failure)     a. Cardiac MRI (10/2012): Moderate to severe LVH, EF 55%, chordal SAM but no LVOT gradient, mod circumferential effusion, mild LAE.  b.  Echocardiogram (11/06/12): Severe LVH, EF 70-48%, grade 1 diastolic dysfunction, moderate effusion.  . Wears glasses   . History of myocardial infarction     2011--  S/P CARDIAC CATH ([place])  NON-OBSTRUCTIVE CAD  . GERD (gastroesophageal reflux disease)   . History of kidney stones   . History of CVA (cerebrovascular accident) without residual deficits     2008  AND TIA IN 2008  W/ NO RESIDUAL  . History of peptic ulcer     2011--  RESOLVED  . 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 Breast  cancer DX  OCT 2014  ---  STAGE IIA  DCIS (T2  N0)    11-09-2012  RIGHT BREAST LUMPECTOMY W/ SLN DISSECTION---  CHEMOTHERAPY  . Gastroparesis due to DM   . Chronic kidney disease (CKD), stage III (moderate)     NEPHROLOGIST--  Monica Monica Lee  . Open thigh wound     ANTERIOR  . Coronary artery disease CARDIOLOGIST--  Monica Monica Lee    NON-OBSTRUCTIVE CAD  PER CARDIAC CATH  2011  . Pericardial effusion without  cardiac tamponade     PER Monica Lee  NOTE --  NOT MALIGNANT  ? RELATED TO HYPOTHYROIDISM  . Thyromegaly     MULTINODULER GOITER  . Hyperparathyroidism, secondary renal   . Anemia in chronic kidney disease   . Trigeminal neuralgia   . Hypothyroidism   . At risk for sleep apnea     STOP-BANG= 4   SENT TO PCP  03-25-2013    PAST SURGICAL HISTORY: Past Surgical History  Procedure Laterality Date  . Transplantation renal  05/2006    CADAVERIC  . Breast biopsy  2010  . Cholecystectomy  2008  . Hemorrhoid surgery  01/23/2012    Procedure: HEMORRHOIDECTOMY PROLAPSED;  Surgeon: Monica Ruff, Lee;  Location: [facility];  Service: General;  Laterality: N/A;  . Foot surgery Right 2013  . Breast lumpectomy with sentinel lymph node biopsy Right 11/09/2012    Procedure: RIGHT BREAST LUMPECTOMY WITH SENTINEL LYMPH NODE REMOVAL;  Surgeon: Monica Lasso, Lee;  Location: [place];  Service: General;  Laterality: Right;  . Portacath placement Right 11/30/2012    Procedure: INSERTION PORT-A-CATH;  Surgeon: Monica Lasso, Lee;  Location: [facility] [place];  Service: General;  Laterality: Right;  . Irrigation and debridement abscess Left 02/14/2013    Procedure: IRRIGATION AND DEBRIDEMENT LEFT THIGH ABSCESS;  Surgeon: Monica Faster. Cornett, Lee;  Location: [place];  Service: General;  Laterality: Left;  . Incision and drainage of wound Left 02/18/2013    Procedure: IRRIGATION AND DEBRIDEMENT THIGH WOUND;  Surgeon: Monica Bookbinder, Lee;  Location: [place];  Service: General;  Laterality: Left;  . Cataract extraction w/ intraocular lens  implant, bilateral    . Repair  intestinal perforation surgery  01-31-2009  . Cardiac catheterization  04-12-2009  Monica Monica Lee ([facility] IN [place], [place])    NON-OBSTRUCTIVE CAD/  mLAD 40%,  oLAD 50%,  dCFX 40%,  OM1  40%,  mRCA  &  dRCA  50%/  LVEF 65% /  ELEVATED  LVEDP  . Transthoracic echocardiogram  01-19-2013  Monica Lee    SEVERE LVH/  EF 60-65%/   GRADE I DIASTOLIC DYSFUNCTION/  MODERATE LAE/  MILD IMPROVEMENT OF MODERATE CIRCUMFERENTIAL INFERIOROLATERAL PERICARDIAL EFFUSION WITH NO TAMPONADE  . Cardiovascular stress test  11-04-2012  Monica Lee    HIGH RISK NUCLEAR STUDY/  FIXED DEFECT AFFECTING ENTIRE INFEROLATERAL AND ANTEROLATERAL WALL/  MODERATE ISCHEMIA  AT MID/ APICAL ANTEROR AND INFERIOR WALL/  GLOBAL HYPOKINESIS/  LVEF 28% (FELT TO BE FALSE +,  ECHO & cMRI  NORMAL EF AND NORMAL WM)  . Vaginal hysterectomy  1990's    W/  BILATERAL SALPINGOOPHORECTOMY  . Eye surgery Left 2003    REPAIR CORNEA  . Skin split graft Left 03/29/2013    Procedure: SKIN GRAFT SPLIT THICKNESS WITH A-CELL AND VAC TO LEFT THIGH;  Surgeon: Monica Kos, DO;  Location: [place];  Service: Plastics;  Laterality: Left;    FAMILY HISTORY Family History  Problem Relation Age of Onset  . Kidney disease Neg Hx   . Arthritis Other     parent  . Heart disease Other     parent  . Hypertension Other     parent  . Diabetes Other     parent  . Diabetes Other     grandparent  . Thyroid disease Other     several  . Heart disease Father   The patient's father died at the age of 44 from a myocardial infarction. The patient's mother died in childbirth. The patient had 2 brothers, one sister. There is no history of breast or ovarian cancer in the family to her knowledge.  GYNECOLOGIC HISTORY:  Menarche age 13, first live birth age 43. The patient is GX P4. Gender 1 hysterectomy without salpingo-oophorectomy in the early 1990s. She did not take hormone replacement.   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 Her husband Wille Glaser used to work in Architect. He is also retired. It's just the 2 of them at home. She has 4 grandchildren. She attends a local [place] DIRECTIVES:    HEALTH MAINTENANCE: History  Substance Use Topics  . Smoking status: Never Smoker   . Smokeless tobacco: Never Used  . Alcohol  Use: No     Colonoscopy:  PAP:  Bone density:  Lipid panel:  Allergies  Allergen Reactions  . Keflex [Cephalexin] Swelling    Swelling to face, chills Tolerates zosyn 02/14/13  . Propoxyphene N-Acetaminophen Hives and Itching    Current Outpatient Prescriptions  Medication Sig Dispense Refill  . albuterol (ACCUNEB) 0.63 MG/3ML nebulizer solution Take 1 ampule by nebulization every 6 (six) hours as needed for wheezing.      Marland Kitchen albuterol (PROVENTIL HFA;VENTOLIN HFA) 108 (90 BASE) MCG/ACT inhaler Inhale 2 puffs into the lungs every 6 (six) hours as needed for wheezing or shortness of breath.      Marland Kitchen amLODipine (NORVASC) 10 MG tablet Take 10 mg by mouth every morning.      Marland Kitchen aspirin EC 325 MG tablet Take 325 mg by mouth daily.        Marland Kitchen azaTHIOprine (IMURAN) 50 MG tablet Take 50 mg by mouth 2 (two) times daily.        . brimonidine (ALPHAGAN) 0.2 % ophthalmic solution Place 1 drop into the right eye 2 (two) times daily.       . cinacalcet (SENSIPAR) 30 MG tablet Take 30 mg by mouth every morning.      Marland Kitchen dexamethasone (DECADRON) 4 MG tablet Take 2 tablets (8 mg total) by mouth 2 (two) times daily with a meal.  Take daily starting the day after chemotherapy for 2 days.  16 tablet  0  . docusate sodium (COLACE) 100 MG capsule Take 1 capsule (100 mg total) by mouth 2 (two) times daily.  60 capsule  0  . esomeprazole (NEXIUM) 40 MG capsule Take 40 mg by mouth daily before breakfast.      . feeding supplement, GLUCERNA SHAKE, (GLUCERNA SHAKE) LIQD Take 237 mLs by mouth 2 (two) times daily between meals.    0  . furosemide (LASIX) 80 MG tablet Take 160 mg by mouth 2 (two) times daily.       . insulin aspart (NOVOLOG) 100 UNIT/ML injection Inject 13-16 Units into the skin 3 (three) times daily. Inject 15 units daily at breakfast, 15 units daily at lunch, and 17 units daily at supper.  20 mL  3  . insulin glargine (LANTUS) 100 UNIT/ML injection Inject 0.45 mLs (45 Units total) into the skin at bedtime.   20 mL  3  . isosorbide mononitrate (IMDUR) 60 MG 24 hr tablet Take 60 mg by mouth every morning.      Marland Kitchen ketoconazole (NIZORAL) 2 % cream Apply 1 application topically daily.  15 g  0  . KLOR-CON M20 20 MEQ tablet Takes 1/2 tab in morning and 1/2 at night.      . levothyroxine (SYNTHROID, LEVOTHROID) 75 MCG tablet Take 1 tablet (75 mcg total) by mouth daily.  90 tablet  3  . lidocaine-prilocaine (EMLA) cream Apply 1 application topically as needed (access port for chemo). Use only on tuesdays and Thursday with chemo      . LORazepam (ATIVAN) 0.5 MG tablet Take 0.5 mg by mouth every 6 (six) hours as needed.      . Magnesium Oxide 250 MG TABS Take 250 mg by mouth daily.       . metoprolol tartrate (LOPRESSOR) 25 MG tablet Take 1 tablet (25 mg total) by mouth 2 (two) times daily.  60 tablet  1  . minoxidil (LONITEN) 2.5 MG tablet Take 2.5 mg by mouth 2 (two) times daily.      . nitroGLYCERIN (NITROSTAT) 0.4 MG SL tablet Place 0.4 mg under the tongue every 5 (five) minutes as needed for chest pain.      . polyethylene glycol (MIRALAX / GLYCOLAX) packet Take 17 g by mouth daily.  14 each  11  . pravastatin (PRAVACHOL) 20 MG tablet Take 10 mg by mouth at bedtime.        . prednisoLONE acetate (PRED FORTE) 1 % ophthalmic suspension Place 1 drop into the left eye 2 (two) times daily.       . predniSONE (DELTASONE) 10 MG tablet Take 10 mg by mouth every morning.       . prochlorperazine (COMPAZINE) 10 MG tablet Take 10 mg by mouth every 6 (six) hours as needed for nausea or vomiting.      . tacrolimus (PROGRAF) 1 MG capsule Take 9 mg by mouth 2 (two) times daily.       . timolol (TIMOPTIC) 0.25 % ophthalmic solution Place 1 drop into the right eye 2 (two) times daily.       No current facility-administered medications for this visit.    OBJECTIVE: Middle-aged [place] American woman in no acute distress  There were no vitals filed for this visit.   There is no weight on file to calculate BMI.    ECOG  FS:1 - Symptomatic but completely ambulatory General: well-nourished in no acute distress. Alert and oriented X 3 HEENT: PERRLA. Anicteric sclerae.  Mucous membranes moist Lymph nodes: no adenopathy appreciated within cervical, supraclavicular, infraclavicular or axillary regions.  Lungs: Clear to ausculatation throughout. No rales or wheezing. Cardiovascular: regular, rate, rhythm. S1, S2. No jugular vein distension. Positive peripheral pulses. Breast:  no palpable masses in both breasts Abdomen:  Positive bowel sounds. No hepatospenomegaly. Non-tender. Non-distended Groin: No adenopathy Extremities: No swelling bilaterally Cranial nerves: no gross focal deficits Skin: left lateral thigh skin graft, well-healed. Right lateral thigh region where the graft was obtained, hyperpigmented and a well-healed.  LAB RESULTS:  CMP     Component Value Date/Time   NA 142 06/14/2013 1022   NA 140 03/29/2013 1334   K 3.9 06/14/2013 1022   K 4.0 03/29/2013 1334   CL 98 03/29/2013 1334  CO2 27 06/14/2013 1022   CO2 29 02/19/2013 0520   GLUCOSE 130 06/14/2013 1022   GLUCOSE 117* 03/29/2013 1334   BUN 21.3 06/14/2013 1022   BUN 20 03/29/2013 1334   CREATININE 1.2* 06/14/2013 1022   CREATININE 1.30* 03/29/2013 1334   CALCIUM 10.0 06/14/2013 1022   CALCIUM 8.2* 02/19/2013 0520   PROT 7.3 06/14/2013 1022   PROT 6.0 02/15/2013 0317   ALBUMIN 3.8 06/14/2013 1022   ALBUMIN 2.2* 02/15/2013 0317   AST 16 06/14/2013 1022   AST 26 02/15/2013 0317   ALT 11 06/14/2013 1022   ALT 16 02/15/2013 0317   ALKPHOS 51 06/14/2013 1022   ALKPHOS 69 02/15/2013 0317   BILITOT 0.39 06/14/2013 1022   BILITOT 0.4 02/15/2013 0317   GFRNONAA 60* 02/22/2013 1017   GFRAA 69* 02/22/2013 1017    I No results found for this basename: SPEP,  UPEP,   kappa and lambda light chains    Lab Results  Component Value Date   WBC 4.8 06/14/2013   NEUTROABS 3.2 06/14/2013   HGB 13.0 06/14/2013   HCT 40.4 06/14/2013   MCV 86.5 06/14/2013   PLT 219 06/14/2013      Chemistry       Component Value Date/Time   NA 142 06/14/2013 1022   NA 140 03/29/2013 1334   K 3.9 06/14/2013 1022   K 4.0 03/29/2013 1334   CL 98 03/29/2013 1334   CO2 27 06/14/2013 1022   CO2 29 02/19/2013 0520   BUN 21.3 06/14/2013 1022   BUN 20 03/29/2013 1334   CREATININE 1.2* 06/14/2013 1022   CREATININE 1.30* 03/29/2013 1334      Component Value Date/Time   CALCIUM 10.0 06/14/2013 1022   CALCIUM 8.2* 02/19/2013 0520   ALKPHOS 51 06/14/2013 1022   ALKPHOS 69 02/15/2013 0317   AST 16 06/14/2013 1022   AST 26 02/15/2013 0317   ALT 11 06/14/2013 1022   ALT 16 02/15/2013 0317   BILITOT 0.39 06/14/2013 1022   BILITOT 0.4 02/15/2013 0317       No results found for this basename: LABCA2    No components found with this basename: LABCA125    No results found for this basename: INR,  in the last 168 hours  Urinalysis    Component Value Date/Time   COLORURINE AMBER* 02/14/2013 0228   APPEARANCEUR CLOUDY* 02/14/2013 0228   LABSPEC 1.023 02/14/2013 0228   PHURINE 5.5 02/14/2013 0228   GLUCOSEU 250* 02/14/2013 0228   GLUCOSEU NEGATIVE 12/05/2011 1027   HGBUR TRACE* 02/14/2013 0228   BILIRUBINUR SMALL* 02/14/2013 0228   KETONESUR 15* 02/14/2013 0228   PROTEINUR 100* 02/14/2013 0228   UROBILINOGEN 1.0 02/14/2013 0228   NITRITE NEGATIVE 02/14/2013 0228   LEUKOCYTESUR NEGATIVE 02/14/2013 0228    IMPRESSION/REPORT/PLAN: 63 y.o. [place], [place] woman status post right lumpectomy and sentinel lymph node sampling 11/09/2012 for pT2 pN0, stage IIA invasive ductal carcinoma, grade 3, triple negative, with an MIB-1 of 56%  (1) started adjuvant cyclophosphamide, methotrexate and fluorouracil 12-2012, received 3 cycles, then had a fall with injury to her left leg requiring skin grafting, and her subsequent cycles were delayed.  (2) resuming CMF treatment 05/24/2013, to be completed 07/05/2013  (3) adjuvant radiation to follow chemotherapy; the patient is not yet sure if she would prefer to receive radiation in [place], [place], or  [place]  PLAN: This is here for cycle 5 of CMF. We discussed that she is due for another cycle in June  of 2015. She will then undergo adjuvant radiation treatment. Patient wishes to radiation treatment to the done in [place] which is closer to home. We shall setup for her to see a radiation oncology in [place] in the interim. We had an extensive discussion that adjuvant radiation is recommended to decrease the risk for local recurrence. Chemotherapy is for decreasing her risk for systemic recurrence.  Multiple questions answered  Total time 25 minutes face-to-face with more than 50% spent on counseling.  Doristine Church, Lee   06/15/2013 3:05 PM

## 2013-06-15 NOTE — Progress Notes (Signed)
err

## 2013-06-17 ENCOUNTER — Telehealth: Payer: Self-pay | Admitting: Oncology

## 2013-06-17 NOTE — Telephone Encounter (Signed)
, °

## 2013-06-21 ENCOUNTER — Telehealth: Payer: Self-pay | Admitting: Internal Medicine

## 2013-06-21 MED ORDER — INSULIN GLARGINE 300 UNIT/ML ~~LOC~~ SOPN: 45.0000 [IU] | PEN_INJECTOR | Freq: Every day | SUBCUTANEOUS | Status: DC

## 2013-06-21 MED ORDER — INSULIN ASPART 100 UNIT/ML ~~LOC~~ SOLN: 13.0000 [IU] | Freq: Three times a day (TID) | SUBCUTANEOUS | Status: DC

## 2013-06-21 NOTE — Telephone Encounter (Signed)
Patient is completely out of novolog and lantus.  She is requesting scripts to be called in to [facility] in [place].

## 2013-06-22 ENCOUNTER — Telehealth: Payer: Self-pay | Admitting: *Deleted

## 2013-06-22 ENCOUNTER — Telehealth: Payer: Self-pay | Admitting: Oncology

## 2013-06-22 MED ORDER — INSULIN ASPART 100 UNIT/ML ~~LOC~~ SOLN: 13.0000 [IU] | Freq: Three times a day (TID) | SUBCUTANEOUS | Status: DC

## 2013-06-22 MED ORDER — INSULIN GLARGINE 100 UNIT/ML ~~LOC~~ SOLN: 45.0000 [IU] | Freq: Every day | SUBCUTANEOUS | Status: DC

## 2013-06-22 NOTE — Addendum Note (Signed)
Addended by: Sharon Seller B on: 06/22/2013 11:11 AM   Modules accepted: Orders

## 2013-06-22 NOTE — Addendum Note (Signed)
Addended by: Sharon Seller B on: 06/22/2013 11:40 AM   Modules accepted: Orders

## 2013-06-22 NOTE — Telephone Encounter (Signed)
Received a call from [place] at [facility] stating that this pt was referred over by Webb Silversmith in scheduling and they have not received records yet and pt is scheduled to come in on Friday, 6/12.  Emailed Blima Singer and Tiff in HIM to request for the records to be faxed to [place] at [phone].

## 2013-06-22 NOTE — Telephone Encounter (Signed)
Faxed pt medical records to [place]

## 2013-06-22 NOTE — Telephone Encounter (Signed)
Refill done.  

## 2013-06-25 DIAGNOSIS — C50519 Malignant neoplasm of lower-outer quadrant of unspecified female breast: Secondary | ICD-10-CM | POA: Diagnosis not present

## 2013-06-25 DIAGNOSIS — I5032 Chronic diastolic (congestive) heart failure: Secondary | ICD-10-CM | POA: Diagnosis not present

## 2013-06-25 DIAGNOSIS — I252 Old myocardial infarction: Secondary | ICD-10-CM | POA: Diagnosis not present

## 2013-06-25 DIAGNOSIS — Z8673 Personal history of transient ischemic attack (TIA), and cerebral infarction without residual deficits: Secondary | ICD-10-CM | POA: Diagnosis not present

## 2013-06-25 DIAGNOSIS — I509 Heart failure, unspecified: Secondary | ICD-10-CM | POA: Diagnosis not present

## 2013-06-25 DIAGNOSIS — I10 Essential (primary) hypertension: Secondary | ICD-10-CM | POA: Diagnosis not present

## 2013-06-25 DIAGNOSIS — Z171 Estrogen receptor negative status [ER-]: Secondary | ICD-10-CM | POA: Diagnosis not present

## 2013-06-25 DIAGNOSIS — Z940 Kidney transplant status: Secondary | ICD-10-CM | POA: Diagnosis not present

## 2013-06-25 DIAGNOSIS — H5440 Blindness, one eye, unspecified eye: Secondary | ICD-10-CM | POA: Diagnosis not present

## 2013-06-25 DIAGNOSIS — E1139 Type 2 diabetes mellitus with other diabetic ophthalmic complication: Secondary | ICD-10-CM | POA: Diagnosis not present

## 2013-06-25 DIAGNOSIS — Z794 Long term (current) use of insulin: Secondary | ICD-10-CM | POA: Diagnosis not present

## 2013-06-25 DIAGNOSIS — E11319 Type 2 diabetes mellitus with unspecified diabetic retinopathy without macular edema: Secondary | ICD-10-CM | POA: Diagnosis not present

## 2013-07-02 ENCOUNTER — Other Ambulatory Visit: Payer: Self-pay | Admitting: *Deleted

## 2013-07-05 ENCOUNTER — Other Ambulatory Visit (HOSPITAL_BASED_OUTPATIENT_CLINIC_OR_DEPARTMENT_OTHER): Payer: Medicare Other

## 2013-07-05 ENCOUNTER — Ambulatory Visit (HOSPITAL_BASED_OUTPATIENT_CLINIC_OR_DEPARTMENT_OTHER): Payer: Medicare Other

## 2013-07-05 ENCOUNTER — Encounter: Payer: Self-pay | Admitting: Oncology

## 2013-07-05 ENCOUNTER — Other Ambulatory Visit: Payer: Medicare Other

## 2013-07-05 ENCOUNTER — Ambulatory Visit (HOSPITAL_BASED_OUTPATIENT_CLINIC_OR_DEPARTMENT_OTHER): Payer: Medicare Other | Admitting: Oncology

## 2013-07-05 ENCOUNTER — Other Ambulatory Visit: Payer: Self-pay | Admitting: *Deleted

## 2013-07-05 VITALS — BP 139/57 | HR 73 | Temp 98.0°F | Resp 18 | Ht 65.0 in | Wt 189.6 lb

## 2013-07-05 DIAGNOSIS — Z171 Estrogen receptor negative status [ER-]: Secondary | ICD-10-CM | POA: Diagnosis not present

## 2013-07-05 DIAGNOSIS — C50511 Malignant neoplasm of lower-outer quadrant of right female breast: Secondary | ICD-10-CM

## 2013-07-05 DIAGNOSIS — Z5111 Encounter for antineoplastic chemotherapy: Secondary | ICD-10-CM | POA: Diagnosis not present

## 2013-07-05 DIAGNOSIS — C50419 Malignant neoplasm of upper-outer quadrant of unspecified female breast: Secondary | ICD-10-CM

## 2013-07-05 DIAGNOSIS — C50519 Malignant neoplasm of lower-outer quadrant of unspecified female breast: Secondary | ICD-10-CM

## 2013-07-05 LAB — CBC WITH DIFFERENTIAL/PLATELET
BASO%: 0.5 % (ref 0.0–2.0)
Basophils Absolute: 0 10*3/uL (ref 0.0–0.1)
EOS ABS: 0 10*3/uL (ref 0.0–0.5)
EOS%: 0.7 % (ref 0.0–7.0)
HCT: 39.4 % (ref 34.8–46.6)
HGB: 12.2 g/dL (ref 11.6–15.9)
LYMPH%: 11.1 % — AB (ref 14.0–49.7)
MCH: 27.2 pg (ref 25.1–34.0)
MCHC: 31 g/dL — ABNORMAL LOW (ref 31.5–36.0)
MCV: 87.8 fL (ref 79.5–101.0)
MONO#: 1.3 10*3/uL — AB (ref 0.1–0.9)
MONO%: 21.7 % — ABNORMAL HIGH (ref 0.0–14.0)
NEUT%: 66 % (ref 38.4–76.8)
NEUTROS ABS: 3.9 10*3/uL (ref 1.5–6.5)
Platelets: 186 10*3/uL (ref 145–400)
RBC: 4.49 10*6/uL (ref 3.70–5.45)
RDW: 16.4 % — AB (ref 11.2–14.5)
WBC: 5.9 10*3/uL (ref 3.9–10.3)
lymph#: 0.7 10*3/uL — ABNORMAL LOW (ref 0.9–3.3)

## 2013-07-05 LAB — COMPREHENSIVE METABOLIC PANEL (CC13)
ALT: 10 U/L (ref 0–55)
AST: 17 U/L (ref 5–34)
Albumin: 3.6 g/dL (ref 3.5–5.0)
Alkaline Phosphatase: 52 U/L (ref 40–150)
Anion Gap: 9 meq/L (ref 3–11)
BUN: 19.8 mg/dL (ref 7.0–26.0)
CO2: 30 meq/L — ABNORMAL HIGH (ref 22–29)
Calcium: 9.1 mg/dL (ref 8.4–10.4)
Chloride: 104 meq/L (ref 98–109)
Creatinine: 1.2 mg/dL — ABNORMAL HIGH (ref 0.6–1.1)
Glucose: 160 mg/dL — ABNORMAL HIGH (ref 70–140)
Potassium: 3.7 meq/L (ref 3.5–5.1)
Sodium: 143 meq/L (ref 136–145)
Total Bilirubin: 0.39 mg/dL (ref 0.20–1.20)
Total Protein: 7.1 g/dL (ref 6.4–8.3)

## 2013-07-05 MED ORDER — FLUOROURACIL CHEMO INJECTION 2.5 GM/50ML
600.0000 mg/m2 | Freq: Once | INTRAVENOUS | Status: AC
  Administered 2013-07-05: 1200 mg via INTRAVENOUS
  Filled 2013-07-05: qty 24

## 2013-07-05 MED ORDER — DEXAMETHASONE SODIUM PHOSPHATE 10 MG/ML IJ SOLN
INTRAMUSCULAR | Status: AC
  Filled 2013-07-05: qty 1

## 2013-07-05 MED ORDER — SODIUM CHLORIDE 0.9 % IJ SOLN
10.0000 mL | INTRAMUSCULAR | Status: DC | PRN
  Administered 2013-07-05: 10 mL
  Filled 2013-07-05: qty 10

## 2013-07-05 MED ORDER — ONDANSETRON 8 MG/NS 50 ML IVPB
INTRAVENOUS | Status: AC
  Filled 2013-07-05: qty 8

## 2013-07-05 MED ORDER — METHOTREXATE SODIUM CHEMO INJECTION 25 MG/ML
40.0000 mg/m2 | Freq: Once | INTRAMUSCULAR | Status: AC
  Administered 2013-07-05: 75 mg via INTRAVENOUS
  Filled 2013-07-05: qty 3

## 2013-07-05 MED ORDER — HEPARIN SOD (PORK) LOCK FLUSH 100 UNIT/ML IV SOLN
500.0000 [IU] | Freq: Once | INTRAVENOUS | Status: AC | PRN
  Administered 2013-07-05: 500 [IU]
  Filled 2013-07-05: qty 5

## 2013-07-05 MED ORDER — SODIUM CHLORIDE 0.9 % IV SOLN
Freq: Once | INTRAVENOUS | Status: AC
  Administered 2013-07-05: 12:00:00 via INTRAVENOUS

## 2013-07-05 MED ORDER — SODIUM CHLORIDE 0.9 % IV SOLN
600.0000 mg/m2 | Freq: Once | INTRAVENOUS | Status: AC
  Administered 2013-07-05: 1220 mg via INTRAVENOUS
  Filled 2013-07-05: qty 61

## 2013-07-05 MED ORDER — DEXAMETHASONE SODIUM PHOSPHATE 10 MG/ML IJ SOLN
10.0000 mg | Freq: Once | INTRAMUSCULAR | Status: AC
  Administered 2013-07-05: 10 mg via INTRAVENOUS

## 2013-07-05 MED ORDER — ONDANSETRON 8 MG/50ML IVPB (CHCC)
8.0000 mg | Freq: Once | INTRAVENOUS | Status: AC
  Administered 2013-07-05: 8 mg via INTRAVENOUS

## 2013-07-05 NOTE — Progress Notes (Signed)
[place]  Telephone:([phone]) [phone] Fax:(336) [phone]     ID: Monica Lee OB: 1950-05-14  MR#: [id number]  OMV#:[id number]  PCP: Monica Cower, Lee GYN:   SUGerald Stabs Lee] OTHER Lee: Monica Lee, Monica Lee  CHIEF COMPLAINT: "I think am okay for chemotherapy now"  BREAST CANCER HISTORY: The patient herself palpated a mass in her right breast. She brought it to medical attention at the time of her routinely scheduled mammogram 10/08/2012. This showed an irregular mass in the right breast lower outer quadrant which by ultrasound measured 2.1 cm. Biopsy of the mass was performed the same day, and showed ([id number]) invasive ductal carcinoma, grade 3, triple negative, with an MIB-1 of 56%. She proceeded to right lumpectomy and sentinel lymph node sampling 11/09/2012 with the pathology(SZA 14-04/07/2001 showing invasive ductal carcinoma, grade 3, measuring 2.2 cm, with negative margins, and all 3 sentinel lymph nodes benign. Repeat prognostic panel was again triple negative. The HER-2 signals ratio was 1.38 and the number per cell 2.2.  Her subsequent history is as detailed below  INTERVAL HISTORY: Monica Lee returns today for followup of her breast cancer. She has been doing well with her chemo. She has no specific complaints today. Skin graft to left thigh has healed.  REVIEW OF SYSTEMS: She denies unusual headaches, visual changes, pain on swallowing or difficulty swallowing, worsening shortness of breath, chest pain or pressure, or change in bowel or bladder habits. She has a variety of chronic illnesses including arthritis, diabetes, a history of peptic ulcer disease. A detailed review of systems today however was stable.   PAST MEDICAL HISTORY: Past Medical History  Diagnosis Date  . S/P kidney transplant     CADAVERIC --  05/2006  . Hyperlipidemia   . DJD (degenerative joint disease)   . Hx of colonic polyps   . Diabetic retinopathy     RIGHT EYE  .  Hypertension   . Asthma   . Blind left eye     OCCLUDED CORNEA,  FUNDI--  FAILED SURGERY REPAIR 2003  . Chronic diastolic CHF (congestive heart failure)     a. Cardiac MRI (10/2012): Moderate to severe LVH, EF 55%, chordal SAM but no LVOT gradient, mod circumferential effusion, mild LAE.  b.  Echocardiogram (11/06/12): Severe LVH, EF 94-76%, grade 1 diastolic dysfunction, moderate effusion.  . Wears glasses   . History of myocardial infarction     2011--  S/P CARDIAC CATH ([place])  NON-OBSTRUCTIVE CAD  . GERD (gastroesophageal reflux disease)   . History of kidney stones   . History of CVA (cerebrovascular accident) without residual deficits     2008  AND TIA IN 2008  W/ NO RESIDUAL  . History of peptic ulcer     2011--  RESOLVED  . 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 Gastroparesis due to DM   . Chronic kidney disease (CKD), stage III (moderate)     NEPHROLOGIST--  Monica Monica Lee  . Open thigh wound     ANTERIOR  . Coronary artery disease CARDIOLOGIST--  Monica Monica Lee    NON-OBSTRUCTIVE CAD  PER CARDIAC CATH  2011  . Pericardial effusion without cardiac tamponade     PER Monica Lee  NOTE --  NOT MALIGNANT  ? RELATED TO HYPOTHYROIDISM  . Thyromegaly  MULTINODULER GOITER  . Hyperparathyroidism, secondary renal   . Anemia in chronic kidney disease   . Trigeminal neuralgia   . Hypothyroidism   . At risk for sleep apnea     STOP-BANG= 4   SENT TO PCP  03-25-2013    PAST SURGICAL HISTORY: Past Surgical History  Procedure Laterality Date  . Transplantation renal  05/2006    CADAVERIC  . Breast biopsy  2010  . Cholecystectomy  2008  . Hemorrhoid surgery  01/23/2012    Procedure: HEMORRHOIDECTOMY PROLAPSED;  Surgeon: Monica Lee;  Location: [facility];  Service: General;  Laterality: N/A;  . Foot surgery Right  2013  . Breast lumpectomy with sentinel lymph node biopsy Right 11/09/2012    Procedure: RIGHT BREAST LUMPECTOMY WITH SENTINEL LYMPH NODE REMOVAL;  Surgeon: Monica Lee;  Location: [facility];  Service: General;  Laterality: Right;  . Portacath placement Right 11/30/2012    Procedure: INSERTION PORT-A-CATH;  Surgeon: Monica Lee;  Location: [facility];  Service: General;  Laterality: Right;  . Irrigation and debridement abscess Left 02/14/2013    Procedure: IRRIGATION AND DEBRIDEMENT LEFT THIGH ABSCESS;  Surgeon: Monica Lee;  Location: [facility];  Service: General;  Laterality: Left;  . Incision and drainage of wound Left 02/18/2013    Procedure: IRRIGATION AND DEBRIDEMENT THIGH WOUND;  Surgeon: Monica Lee;  Location: [facility];  Service: General;  Laterality: Left;  . Cataract extraction w/ intraocular lens  implant, bilateral    . Repair  intestinal perforation surgery  01-31-2009  . Cardiac catheterization  04-12-2009  Monica Lee ([facility] IN [place], [place])    NON-OBSTRUCTIVE CAD/  mLAD 40%,  oLAD 50%,  dCFX 40%,  OM1  40%,  mRCA  &  dRCA  50%/  LVEF 65% /  ELEVATED  LVEDP  . Transthoracic echocardiogram  01-19-2013  Monica Lee    SEVERE LVH/  EF 86-76%/  GRADE I DIASTOLIC DYSFUNCTION/  MODERATE LAE/  MILD IMPROVEMENT OF MODERATE CIRCUMFERENTIAL INFERIOROLATERAL PERICARDIAL EFFUSION WITH NO TAMPONADE  . Cardiovascular stress test  11-04-2012  Monica Lee    HIGH RISK NUCLEAR STUDY/  FIXED DEFECT AFFECTING ENTIRE INFEROLATERAL AND ANTEROLATERAL WALL/  MODERATE ISCHEMIA  AT MID/ APICAL ANTEROR AND INFERIOR WALL/  GLOBAL HYPOKINESIS/  LVEF 28% (FELT TO BE FALSE +,  ECHO & cMRI  NORMAL EF AND NORMAL WM)  . Vaginal hysterectomy  1990's    W/  BILATERAL SALPINGOOPHORECTOMY  . Eye surgery Left 2003    REPAIR CORNEA  . Skin split graft Left 03/29/2013    Procedure: SKIN GRAFT SPLIT THICKNESS WITH A-CELL AND VAC TO LEFT THIGH;  Surgeon: Monica Lee;   Location: [facility];  Service: Plastics;  Laterality: Left;    FAMILY HISTORY Family History  Problem Relation Age of Onset  . Kidney disease Neg Hx   . Arthritis Other     parent  . Heart disease Other     parent  . Hypertension Other     parent  . Diabetes Other     parent  . Diabetes Other     grandparent  . Thyroid disease Other     several  . Heart disease Father   The patient's father died at the age of 40 from a myocardial infarction. The patient's mother died in childbirth. The patient had 2 brothers, one sister. There is no history of breast or ovarian cancer in the family  to her knowledge.  GYNECOLOGIC HISTORY:  Menarche age 3, first live birth age 78. The patient is GX P4. Gender 1 hysterectomy without salpingo-oophorectomy in the early 1990s. She did not take hormone replacement.   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 Her husband Wille Glaser used to work in Architect. He is also retired. It's just the 2 of them at home. She has 4 grandchildren. She attends a local [place] DIRECTIVES:    HEALTH MAINTENANCE: History  Substance Use Topics  . Smoking status: Never Smoker   . Smokeless tobacco: Never Used  . Alcohol Use: No     Colonoscopy:  PAP:  Bone density:  Lipid panel:  Allergies  Allergen Reactions  . Keflex [Cephalexin] Swelling    Swelling to face, chills Tolerates zosyn 02/14/13  . Propoxyphene N-Acetaminophen Hives and Itching    Current Outpatient Prescriptions  Medication Sig Dispense Refill  . albuterol (ACCUNEB) 0.63 MG/3ML nebulizer solution Take 1 ampule by nebulization every 6 (six) hours as needed for wheezing.      Marland Kitchen albuterol (PROVENTIL HFA;VENTOLIN HFA) 108 (90 BASE) MCG/ACT inhaler Inhale 2 puffs into the lungs every 6 (six) hours as needed for wheezing or shortness of breath.      Marland Kitchen amLODipine (NORVASC) 10 MG tablet Take 10 mg by mouth every morning.      Marland Kitchen aspirin EC  325 MG tablet Take 325 mg by mouth daily.        Marland Kitchen azaTHIOprine (IMURAN) 50 MG tablet Take 50 mg by mouth 2 (two) times daily.        . brimonidine (ALPHAGAN) 0.2 % ophthalmic solution Place 1 drop into the right eye 2 (two) times daily.       . cinacalcet (SENSIPAR) 30 MG tablet Take 30 mg by mouth every morning.      Marland Kitchen dexamethasone (DECADRON) 4 MG tablet Take 2 tablets (8 mg total) by mouth 2 (two) times daily with a meal.  Take daily starting the day after chemotherapy for 2 days.  16 tablet  0  . docusate sodium (COLACE) 100 MG capsule Take 1 capsule (100 mg total) by mouth 2 (two) times daily.  60 capsule  0  . esomeprazole (NEXIUM) 40 MG capsule Take 40 mg by mouth daily before breakfast.      . feeding supplement, GLUCERNA SHAKE, (GLUCERNA SHAKE) LIQD Take 237 mLs by mouth 2 (two) times daily between meals.    0  . furosemide (LASIX) 80 MG tablet Take 160 mg by mouth 2 (two) times daily.       . insulin aspart (NOVOLOG) 100 UNIT/ML injection Inject 13-16 Units into the skin 3 (three) times daily. Inject 15 units daily at breakfast, 15 units daily at lunch, and 17 units daily at supper.  3 vial  0  . insulin glargine (LANTUS) 100 UNIT/ML injection Inject 0.45 mLs (45 Units total) into the skin at bedtime.  20 mL  11  . Insulin Glargine (TOUJEO SOLOSTAR) 300 UNIT/ML SOPN Inject 45 Units into the skin at bedtime. Lot  [id number] exp 3/17  3 pen  0  . isosorbide mononitrate (IMDUR) 60 MG 24 hr tablet Take 60 mg by mouth every morning.      .Marland Kitchenketoconazole (NIZORAL) 2 % cream Apply 1 application topically daily.  15 g  0  . KLOR-CON M20 20 MEQ tablet Takes 1/2 tab in morning and 1/2 at night.      .Marland Kitchen  levothyroxine (SYNTHROID, LEVOTHROID) 75 MCG tablet Take 1 tablet (75 mcg total) by mouth daily.  90 tablet  3  . lidocaine-prilocaine (EMLA) cream Apply 1 application topically as needed (access port for chemo). Use only on tuesdays and Thursday with chemo      . LORazepam (ATIVAN) 0.5 MG tablet Take 0.5  mg by mouth every 6 (six) hours as needed.      . Magnesium Oxide 250 MG TABS Take 250 mg by mouth daily.       . metoprolol tartrate (LOPRESSOR) 25 MG tablet Take 1 tablet (25 mg total) by mouth 2 (two) times daily.  60 tablet  1  . minoxidil (LONITEN) 2.5 MG tablet Take 2.5 mg by mouth 2 (two) times daily.      . nitroGLYCERIN (NITROSTAT) 0.4 MG SL tablet Place 0.4 mg under the tongue every 5 (five) minutes as needed for chest pain.      . polyethylene glycol (MIRALAX / GLYCOLAX) packet Take 17 g by mouth daily.  14 each  11  . pravastatin (PRAVACHOL) 20 MG tablet Take 10 mg by mouth at bedtime.        . prednisoLONE acetate (PRED FORTE) 1 % ophthalmic suspension Place 1 drop into the left eye 2 (two) times daily.       . predniSONE (DELTASONE) 10 MG tablet Take 10 mg by mouth every morning.       . prochlorperazine (COMPAZINE) 10 MG tablet Take 10 mg by mouth every 6 (six) hours as needed for nausea or vomiting.      . tacrolimus (PROGRAF) 1 MG capsule Take 9 mg by mouth 2 (two) times daily.       . timolol (TIMOPTIC) 0.25 % ophthalmic solution Place 1 drop into the right eye 2 (two) times daily.       No current facility-administered medications for this visit.   Facility-Administered Medications Ordered in Other Visits  Medication Dose Route Frequency Provider Last Rate Last Dose  . sodium chloride 0.9 % injection 10 mL  10 mL Intracatheter PRN Deatra Robinson, Lee   10 mL at 07/05/13 1336    OBJECTIVE: Middle-aged [place] American woman in no acute distress  Filed Vitals:   07/05/13 1115  BP: 139/57  Pulse: 73  Temp: 98 F (36.7 C)  Resp: 18     Body mass index is 31.55 kg/(m^2).    ECOG FS:1 - Symptomatic but completely ambulatory  Ocular: Sclerae unicteric, bilateral arcus senilis  Ear-nose-throat: Oropharynx clear and moist  Lymphatic: No cervical or supraclavicular adenopathy Lungs no rales or rhonchi, good excursion bilaterally Heart regular rate and rhythm, no murmur  appreciated Abd soft,  obese, nontender, positive bowel sounds MSK no focal spinal tenderness, noupper extremity lymphedema  Neuro: non-focal, well-oriented, appropriate affect Breasts: Deferred   LAB RESULTS:  CMP     Component Value Date/Time   NA 143 07/05/2013 1104   NA 140 03/29/2013 1334   K 3.7 07/05/2013 1104   K 4.0 03/29/2013 1334   CL 98 03/29/2013 1334   CO2 30* 07/05/2013 1104   CO2 29 02/19/2013 0520   GLUCOSE 160* 07/05/2013 1104   GLUCOSE 117* 03/29/2013 1334   BUN 19.8 07/05/2013 1104   BUN 20 03/29/2013 1334   CREATININE 1.2* 07/05/2013 1104   CREATININE 1.30* 03/29/2013 1334   CALCIUM 9.1 07/05/2013 1104   CALCIUM 8.2* 02/19/2013 0520   PROT 7.1 07/05/2013 1104   PROT 6.0 02/15/2013 0317   ALBUMIN 3.6  07/05/2013 1104   ALBUMIN 2.2* 02/15/2013 0317   AST 17 07/05/2013 1104   AST 26 02/15/2013 0317   ALT 10 07/05/2013 1104   ALT 16 02/15/2013 0317   ALKPHOS 52 07/05/2013 1104   ALKPHOS 69 02/15/2013 0317   BILITOT 0.39 07/05/2013 1104   BILITOT 0.4 02/15/2013 0317   GFRNONAA 60* 02/22/2013 1017   GFRAA 69* 02/22/2013 1017    I No results found for this basename: SPEP,  UPEP,   kappa and lambda light chains    Lab Results  Component Value Date   WBC 5.9 07/05/2013   NEUTROABS 3.9 07/05/2013   HGB 12.2 07/05/2013   HCT 39.4 07/05/2013   MCV 87.8 07/05/2013   PLT 186 07/05/2013      Chemistry      Component Value Date/Time   NA 143 07/05/2013 1104   NA 140 03/29/2013 1334   K 3.7 07/05/2013 1104   K 4.0 03/29/2013 1334   CL 98 03/29/2013 1334   CO2 30* 07/05/2013 1104   CO2 29 02/19/2013 0520   BUN 19.8 07/05/2013 1104   BUN 20 03/29/2013 1334   CREATININE 1.2* 07/05/2013 1104   CREATININE 1.30* 03/29/2013 1334      Component Value Date/Time   CALCIUM 9.1 07/05/2013 1104   CALCIUM 8.2* 02/19/2013 0520   ALKPHOS 52 07/05/2013 1104   ALKPHOS 69 02/15/2013 0317   AST 17 07/05/2013 1104   AST 26 02/15/2013 0317   ALT 10 07/05/2013 1104   ALT 16 02/15/2013 0317   BILITOT 0.39 07/05/2013 1104    BILITOT 0.4 02/15/2013 0317       No results found for this basename: LABCA2    No components found with this basename: LABCA125    No results found for this basename: INR,  in the last 168 hours  Urinalysis    Component Value Date/Time   COLORURINE AMBER* 02/14/2013 0228   APPEARANCEUR CLOUDY* 02/14/2013 0228   LABSPEC 1.023 02/14/2013 0228   PHURINE 5.5 02/14/2013 0228   GLUCOSEU 250* 02/14/2013 0228   GLUCOSEU NEGATIVE 12/05/2011 1027   HGBUR TRACE* 02/14/2013 0228   BILIRUBINUR SMALL* 02/14/2013 0228   KETONESUR 15* 02/14/2013 0228   PROTEINUR 100* 02/14/2013 0228   UROBILINOGEN 1.0 02/14/2013 0228   NITRITE NEGATIVE 02/14/2013 0228   LEUKOCYTESUR NEGATIVE 02/14/2013 0228    STUDIES: No results found.  ASSESSMENT: 63 y.o. Monica Lee, [place] woman status post right lumpectomy and sentinel lymph node sampling 11/09/2012 for a pT2 pN0, stage IIA invasive ductal carcinoma, grade 3, triple negative, with an MIB-1 of 56%  (1) started adjuvant cyclophosphamide, methotrexate and fluorouracil 12-2012, received 3 cycles, then had a fall with injury to her left leg requiring skin grafting, and her subsequent cycles were delayed.  (2) resuming CMF treatment 05/24/2013, to be completed 07/05/2013  (3) adjuvant radiation to follow chemotherapy; the patient is not yet sure if she would prefer to receive radiation in [place], [place], or [place]  PLAN: Monica Lee continues to tolerate chemo well. She will proceed with cycle 6 of CMF today. She will begin XRT in [place] in mid-July 2015. We will bring her back for follow-up in about 2 months with a PAC flush the same day.  Monica Lee has a good understanding of the overall plan. She agrees with it. She knows a goal of treatment in her cases cure. She will call with any problems that may develop before her next visit.  Monica Bussing, NP   07/05/2013 2:19 PM

## 2013-07-05 NOTE — Patient Instructions (Signed)
[facility] Discharge Instructions for Patients Receiving Chemotherapy  Today you received the following chemotherapy agents Cytoxan, Methotrexate. And 5-FU.  To help prevent nausea and vomiting after your treatment, we encourage you to take your nausea medication as directed.    If you develop nausea and vomiting that is not controlled by your nausea medication, call the clinic.   BELOW ARE SYMPTOMS THAT SHOULD BE REPORTED IMMEDIATELY:  *FEVER GREATER THAN 100.5 F  *CHILLS WITH OR WITHOUT FEVER  NAUSEA AND VOMITING THAT IS NOT CONTROLLED WITH YOUR NAUSEA MEDICATION  *UNUSUAL SHORTNESS OF BREATH  *UNUSUAL BRUISING OR BLEEDING  TENDERNESS IN MOUTH AND THROAT WITH OR WITHOUT PRESENCE OF ULCERS  *URINARY PROBLEMS  *BOWEL PROBLEMS  UNUSUAL RASH Items with * indicate a potential emergency and should be followed up as soon as possible.  Feel free to call the clinic you have any questions or concerns. The clinic phone number is [phone].

## 2013-07-07 ENCOUNTER — Telehealth: Payer: Self-pay | Admitting: Oncology

## 2013-07-07 NOTE — Telephone Encounter (Signed)
, °

## 2013-07-08 DIAGNOSIS — M2040 Other hammer toe(s) (acquired), unspecified foot: Secondary | ICD-10-CM | POA: Diagnosis not present

## 2013-07-08 DIAGNOSIS — M7989 Other specified soft tissue disorders: Secondary | ICD-10-CM | POA: Diagnosis not present

## 2013-07-08 DIAGNOSIS — M2010 Hallux valgus (acquired), unspecified foot: Secondary | ICD-10-CM | POA: Diagnosis not present

## 2013-07-09 ENCOUNTER — Ambulatory Visit (INDEPENDENT_AMBULATORY_CARE_PROVIDER_SITE_OTHER): Payer: Medicare Other | Admitting: Cardiovascular Disease

## 2013-07-09 ENCOUNTER — Encounter: Payer: Self-pay | Admitting: Cardiovascular Disease

## 2013-07-09 VITALS — BP 104/86 | HR 62 | Ht 65.0 in | Wt 186.0 lb

## 2013-07-09 DIAGNOSIS — I110 Hypertensive heart disease with heart failure: Secondary | ICD-10-CM | POA: Diagnosis not present

## 2013-07-09 DIAGNOSIS — E1159 Type 2 diabetes mellitus with other circulatory complications: Secondary | ICD-10-CM | POA: Diagnosis not present

## 2013-07-09 DIAGNOSIS — I5032 Chronic diastolic (congestive) heart failure: Secondary | ICD-10-CM

## 2013-07-09 DIAGNOSIS — I2510 Atherosclerotic heart disease of native coronary artery without angina pectoris: Secondary | ICD-10-CM

## 2013-07-09 DIAGNOSIS — I509 Heart failure, unspecified: Secondary | ICD-10-CM

## 2013-07-09 NOTE — Patient Instructions (Signed)
Your physician wants you to follow-up in:   [place] will receive a reminder letter in the mail two months in advance. If you don't receive a letter, please call our office to schedule the follow-up appointment. Your physician recommends that you continue on your current medications as directed. Please refer to the Current Medication list given to you today.

## 2013-07-09 NOTE — Assessment & Plan Note (Signed)
Stable with no angina and good activity level.  Continue medical Rx  

## 2013-07-09 NOTE — Assessment & Plan Note (Signed)
Well controlled.  Continue current medications and low sodium Dash type diet.    

## 2013-07-09 NOTE — Progress Notes (Signed)
Patient ID: Monica Lee, female   DOB: [birth year], 63 y.o.   MRN: [id number] Monica Lee is a 63 y.o. female the history of E0FH, HTN, diastolic CHF, hyperthyroidism, renal failure status post renal transplant, breast CA, prior TIA. She apparently had cardiac catheterization several years ago in [place] that demonstrated no CAD. She was recently seen by Dr. Johnsie Cancel for surgical clearance for breast cancer. Lexiscan Myoview (11/05/12): High-risk scan; fixed defect affecting the entire inferolateral and anterolateral wall; mid/apical anterior and mid/apical inferior moderate ischemia, EF 28%. Cardiac MRI (10/2012): Moderate to severe LVH, EF 55%, chordal SAM but no LVOT gradient, mod circumferential effusion, mild LAE. Echocardiogram (11/06/12): Severe LVH, EF 21-97%, grade 1 diastolic dysfunction, moderate effusion. Tests were reviewed by Dr. Percival Spanish. Given discrepancy of results and lack of symptoms, it was felt the nuclear study was false positive and she was cleared to proceed with lumpectomy on 11/08/12.  She was admitted 58/83-25/4 with a/c diastolic CHF. She presented to the ED with worsening dyspnea and an 8 pound weight gain, orthopnea and PND. She was diuresed with IV Lasix. She was seen by Dr. Haroldine Laws and the Heart Failure Team. Metolazone was added to her medical regimen.   Recent Labs:  04/09/2012: HDL 39.80  11/11/2012: ALT 28; Hemoglobin 12.0  11/12/2012: TSH 40.047*  11/14/2012: Creatinine 1.68*; Potassium 4.1  11/15/2012: Pro B Natriuretic peptide (BNP) 4299.0*  Had porta cath placed 11/17 /14  Done with chemo  Starting XRT in [place]  No CHF issues since being on higher dose diuretic     ROS: Denies fever, malais, weight loss, blurry vision, decreased visual acuity, cough, sputum, SOB, hemoptysis, pleuritic pain, palpitaitons, heartburn, abdominal pain, melena, lower extremity edema, claudication, or rash.  All other systems reviewed and negative  General: Affect  appropriate Overweight black female  HEENT: normal Neck supple with no adenopathy JVP normal no bruits no thyromegaly Lungs clear with no wheezing and good diaphragmatic motion Heart:  S1/S2 no murmur, no rub, gallop or click PMI normal Abdomen: benighn, BS positve, no tenderness, no AAA transplanted kidney LQ no bruit.  No HSM or HJR Distal pulses intact with no bruits No edema Neuro non-focal Skin warm and dry No muscular weakness   Current Outpatient Prescriptions  Medication Sig Dispense Refill  . albuterol (ACCUNEB) 0.63 MG/3ML nebulizer solution Take 1 ampule by nebulization every 6 (six) hours as needed for wheezing.      Marland Kitchen albuterol (PROVENTIL HFA;VENTOLIN HFA) 108 (90 BASE) MCG/ACT inhaler Inhale 2 puffs into the lungs every 6 (six) hours as needed for wheezing or shortness of breath.      Marland Kitchen amLODipine (NORVASC) 10 MG tablet Take 10 mg by mouth every morning.      Marland Kitchen aspirin EC 325 MG tablet Take 325 mg by mouth daily.        Marland Kitchen azaTHIOprine (IMURAN) 50 MG tablet Take 50 mg by mouth 2 (two) times daily.        . brimonidine (ALPHAGAN) 0.2 % ophthalmic solution Place 1 drop into the right eye 2 (two) times daily.       . cinacalcet (SENSIPAR) 30 MG tablet Take 30 mg by mouth every morning.      Marland Kitchen dexamethasone (DECADRON) 4 MG tablet Take 2 tablets (8 mg total) by mouth 2 (two) times daily with a meal.  Take daily starting the day after chemotherapy for 2 days.  16 tablet  0  . docusate sodium (COLACE) 100 MG capsule Take 1 capsule (  100 mg total) by mouth 2 (two) times daily.  60 capsule  0  . esomeprazole (NEXIUM) 40 MG capsule Take 40 mg by mouth daily before breakfast.      . feeding supplement, GLUCERNA SHAKE, (GLUCERNA SHAKE) LIQD Take 237 mLs by mouth 2 (two) times daily between meals.    0  . furosemide (LASIX) 80 MG tablet Take 160 mg by mouth 2 (two) times daily.       . insulin aspart (NOVOLOG) 100 UNIT/ML injection Inject 13-16 Units into the skin 3 (three) times daily.  Inject 15 units daily at breakfast, 15 units daily at lunch, and 17 units daily at supper.  3 vial  0  . insulin glargine (LANTUS) 100 UNIT/ML injection Inject 0.45 mLs (45 Units total) into the skin at bedtime.  20 mL  11  . Insulin Glargine (TOUJEO SOLOSTAR) 300 UNIT/ML SOPN Inject 45 Units into the skin at bedtime. Lot  [id number] exp 3/17  3 pen  0  . isosorbide mononitrate (IMDUR) 60 MG 24 hr tablet Take 60 mg by mouth every morning.      Marland Kitchen ketoconazole (NIZORAL) 2 % cream Apply 1 application topically daily.  15 g  0  . KLOR-CON M20 20 MEQ tablet Takes 1/2 tab in morning and 1/2 at night.      . levothyroxine (SYNTHROID, LEVOTHROID) 75 MCG tablet Take 1 tablet (75 mcg total) by mouth daily.  90 tablet  3  . lidocaine-prilocaine (EMLA) cream Apply 1 application topically as needed (access port for chemo). Use only on tuesdays and Thursday with chemo      . LORazepam (ATIVAN) 0.5 MG tablet Take 0.5 mg by mouth every 6 (six) hours as needed.      . Magnesium Oxide 250 MG TABS Take 250 mg by mouth daily.       . metoprolol tartrate (LOPRESSOR) 25 MG tablet Take 1 tablet (25 mg total) by mouth 2 (two) times daily.  60 tablet  1  . minoxidil (LONITEN) 2.5 MG tablet Take 2.5 mg by mouth 2 (two) times daily.      . nitroGLYCERIN (NITROSTAT) 0.4 MG SL tablet Place 0.4 mg under the tongue every 5 (five) minutes as needed for chest pain.      . polyethylene glycol (MIRALAX / GLYCOLAX) packet Take 17 g by mouth daily.  14 each  11  . pravastatin (PRAVACHOL) 20 MG tablet Take 10 mg by mouth at bedtime.        . prednisoLONE acetate (PRED FORTE) 1 % ophthalmic suspension Place 1 drop into the left eye 2 (two) times daily.       . predniSONE (DELTASONE) 10 MG tablet Take 10 mg by mouth every morning.       . tacrolimus (PROGRAF) 1 MG capsule Take 9 mg by mouth 2 (two) times daily.       . timolol (TIMOPTIC) 0.25 % ophthalmic solution Place 1 drop into the right eye 2 (two) times daily.       No current  facility-administered medications for this visit.    Allergies  Keflex and Propoxyphene n-acetaminophen  Electrocardiogram:  SR LVH with strain  Assessment and Plan

## 2013-07-09 NOTE — Assessment & Plan Note (Signed)
Euvolemic  Chemo done Reassess  EF after XRT done

## 2013-07-09 NOTE — Assessment & Plan Note (Signed)
Discussed low carb diet.  Target hemoglobin A1c is 6.5 or less.  Continue current medications.  

## 2013-07-12 ENCOUNTER — Telehealth: Payer: Self-pay | Admitting: Internal Medicine

## 2013-07-12 ENCOUNTER — Encounter: Payer: Self-pay | Admitting: Pulmonary Disease

## 2013-07-12 ENCOUNTER — Ambulatory Visit (INDEPENDENT_AMBULATORY_CARE_PROVIDER_SITE_OTHER): Payer: Medicare Other | Admitting: Pulmonary Disease

## 2013-07-12 VITALS — BP 128/74 | HR 68 | Temp 98.0°F | Wt 174.0 lb

## 2013-07-12 DIAGNOSIS — I2510 Atherosclerotic heart disease of native coronary artery without angina pectoris: Secondary | ICD-10-CM

## 2013-07-12 DIAGNOSIS — G4733 Obstructive sleep apnea (adult) (pediatric): Secondary | ICD-10-CM | POA: Diagnosis not present

## 2013-07-12 NOTE — Patient Instructions (Signed)
Your bipap machine is set at 17/13 Recheck 1 month download Take tylenol for headache - if persists , call me in 1 -2 weeks & we can lower pressure

## 2013-07-12 NOTE — Telephone Encounter (Signed)
Pt request sample for Novolog. Please cal pt is we have any [phone]

## 2013-07-12 NOTE — Assessment & Plan Note (Signed)
Your bipap machine is set at 17/13 Recheck 1 month download Take tylenol for headache - if persists , call me in 1 -2 weeks & we can lower pressure  Weight loss encouraged, compliance with goal of at least 4-6 hrs every night is the expectation. Advised against medications with sedative side effects Cautioned against driving when sleepy - understanding that sleepiness will vary on a day to day basis

## 2013-07-12 NOTE — Progress Notes (Signed)
   Subjective:    Patient ID: Monica Lee, female    DOB: 1950/10/06, 63 y.o.   MRN: [id number]  HPI  63 y/o woman for followup of severe OSA Past medical history of DM, GERD, s/p renal transplant on immunosurpressives, asthma, breast cancer s/p chemoRx, blindness in left eye, chronic diastolic CHF admitted In 63 2015 for an abscess in her left thigh after a trauma 4 wks ago. - s/p I& D Of the left thigh abscess on 02/18/13 for necrotizing soft tissue infection,now requiring wound VAC.  She was diagnosed with breast cancer in 11/2012 and started chemotherapy with the Port-A-Cath being placed in December 2014  She takes albuterol for occasional dyspnea and is maintained on Imuran and prednisone for immunosuppression   Chief Complaint  Patient presents with  . Follow-up    Pt reports she wears BIPAP everynight. She reports after wearing the bipap for a period of time, she will get a HA. She is not sure if her mask is too tight.     PSG - AHi 67/h, Due to intolerance of high pressures with CPAP BiPAP initiated at 17/13 centimeters with a small full face mask  C/o headache - ? Coffee withdrawal Completed chemo for breast CA Download 06/2013 - on bipap - good usage except last couple of days (due to headache) - no residuals, leak ok   Review of Systems neg for any significant sore throat, dysphagia, itching, sneezing, nasal congestion or excess/ purulent secretions, fever, chills, sweats, unintended wt loss, pleuritic or exertional cp, hempoptysis, orthopnea pnd or change in chronic leg swelling. Also denies presyncope, palpitations, heartburn, abdominal pain, nausea, vomiting, diarrhea or change in bowel or urinary habits, dysuria,hematuria, rash, arthralgias, visual complaints, headache, numbness weakness or ataxia.     Objective:   Physical Exam  Gen. Pleasant, obese, in no distress ENT - no lesions, no post nasal drip Neck: No JVD, no thyromegaly, no carotid bruits Lungs: no use of  accessory muscles, no dullness to percussion, decreased without rales or rhonchi  Cardiovascular: Rhythm regular, heart sounds  normal, no murmurs or gallops, no peripheral edema Musculoskeletal: No deformities, no cyanosis or clubbing , no tremors       Assessment & Plan:

## 2013-07-12 NOTE — Telephone Encounter (Signed)
Notified pt will leave for pick-up...Monica Lee

## 2013-07-15 DIAGNOSIS — M109 Gout, unspecified: Secondary | ICD-10-CM | POA: Diagnosis not present

## 2013-07-19 ENCOUNTER — Telehealth: Payer: Self-pay | Admitting: *Deleted

## 2013-07-19 DIAGNOSIS — E1159 Type 2 diabetes mellitus with other circulatory complications: Secondary | ICD-10-CM

## 2013-07-19 NOTE — Telephone Encounter (Signed)
Patient has an appointment Lipid panel ordered Diabetic bundle 

## 2013-07-20 DIAGNOSIS — E039 Hypothyroidism, unspecified: Secondary | ICD-10-CM | POA: Insufficient documentation

## 2013-07-29 DIAGNOSIS — Z8673 Personal history of transient ischemic attack (TIA), and cerebral infarction without residual deficits: Secondary | ICD-10-CM | POA: Diagnosis not present

## 2013-07-29 DIAGNOSIS — E1139 Type 2 diabetes mellitus with other diabetic ophthalmic complication: Secondary | ICD-10-CM | POA: Diagnosis not present

## 2013-07-29 DIAGNOSIS — I5032 Chronic diastolic (congestive) heart failure: Secondary | ICD-10-CM | POA: Diagnosis not present

## 2013-07-29 DIAGNOSIS — Z171 Estrogen receptor negative status [ER-]: Secondary | ICD-10-CM | POA: Diagnosis not present

## 2013-07-29 DIAGNOSIS — Z940 Kidney transplant status: Secondary | ICD-10-CM | POA: Diagnosis not present

## 2013-07-29 DIAGNOSIS — Z510 Encounter for antineoplastic radiation therapy: Secondary | ICD-10-CM | POA: Diagnosis not present

## 2013-07-29 DIAGNOSIS — H5440 Blindness, one eye, unspecified eye: Secondary | ICD-10-CM | POA: Diagnosis not present

## 2013-07-29 DIAGNOSIS — I319 Disease of pericardium, unspecified: Secondary | ICD-10-CM | POA: Diagnosis not present

## 2013-07-29 DIAGNOSIS — C50519 Malignant neoplasm of lower-outer quadrant of unspecified female breast: Secondary | ICD-10-CM | POA: Diagnosis not present

## 2013-07-29 DIAGNOSIS — I10 Essential (primary) hypertension: Secondary | ICD-10-CM | POA: Diagnosis not present

## 2013-07-29 DIAGNOSIS — I252 Old myocardial infarction: Secondary | ICD-10-CM | POA: Diagnosis not present

## 2013-07-29 DIAGNOSIS — I509 Heart failure, unspecified: Secondary | ICD-10-CM | POA: Diagnosis not present

## 2013-07-29 DIAGNOSIS — Z794 Long term (current) use of insulin: Secondary | ICD-10-CM | POA: Diagnosis not present

## 2013-07-29 DIAGNOSIS — E11319 Type 2 diabetes mellitus with unspecified diabetic retinopathy without macular edema: Secondary | ICD-10-CM | POA: Diagnosis not present

## 2013-07-30 ENCOUNTER — Telehealth: Payer: Self-pay | Admitting: *Deleted

## 2013-07-30 DIAGNOSIS — Z510 Encounter for antineoplastic radiation therapy: Secondary | ICD-10-CM | POA: Diagnosis not present

## 2013-07-30 DIAGNOSIS — C50519 Malignant neoplasm of lower-outer quadrant of unspecified female breast: Secondary | ICD-10-CM | POA: Diagnosis not present

## 2013-08-02 ENCOUNTER — Other Ambulatory Visit: Payer: Self-pay | Admitting: Oncology

## 2013-08-02 DIAGNOSIS — Z171 Estrogen receptor negative status [ER-]: Secondary | ICD-10-CM | POA: Diagnosis not present

## 2013-08-02 DIAGNOSIS — I5032 Chronic diastolic (congestive) heart failure: Secondary | ICD-10-CM | POA: Diagnosis not present

## 2013-08-02 DIAGNOSIS — E1139 Type 2 diabetes mellitus with other diabetic ophthalmic complication: Secondary | ICD-10-CM | POA: Diagnosis not present

## 2013-08-02 DIAGNOSIS — E11319 Type 2 diabetes mellitus with unspecified diabetic retinopathy without macular edema: Secondary | ICD-10-CM | POA: Diagnosis not present

## 2013-08-02 DIAGNOSIS — C50519 Malignant neoplasm of lower-outer quadrant of unspecified female breast: Secondary | ICD-10-CM | POA: Diagnosis not present

## 2013-08-02 DIAGNOSIS — Z510 Encounter for antineoplastic radiation therapy: Secondary | ICD-10-CM | POA: Diagnosis not present

## 2013-08-03 DIAGNOSIS — Z171 Estrogen receptor negative status [ER-]: Secondary | ICD-10-CM | POA: Diagnosis not present

## 2013-08-03 DIAGNOSIS — E11319 Type 2 diabetes mellitus with unspecified diabetic retinopathy without macular edema: Secondary | ICD-10-CM | POA: Diagnosis not present

## 2013-08-03 DIAGNOSIS — E1139 Type 2 diabetes mellitus with other diabetic ophthalmic complication: Secondary | ICD-10-CM | POA: Diagnosis not present

## 2013-08-03 DIAGNOSIS — I5032 Chronic diastolic (congestive) heart failure: Secondary | ICD-10-CM | POA: Diagnosis not present

## 2013-08-03 DIAGNOSIS — C50519 Malignant neoplasm of lower-outer quadrant of unspecified female breast: Secondary | ICD-10-CM | POA: Diagnosis not present

## 2013-08-03 DIAGNOSIS — Z510 Encounter for antineoplastic radiation therapy: Secondary | ICD-10-CM | POA: Diagnosis not present

## 2013-08-04 ENCOUNTER — Telehealth: Payer: Self-pay | Admitting: *Deleted

## 2013-08-04 DIAGNOSIS — E11319 Type 2 diabetes mellitus with unspecified diabetic retinopathy without macular edema: Secondary | ICD-10-CM | POA: Diagnosis not present

## 2013-08-04 DIAGNOSIS — I314 Cardiac tamponade: Secondary | ICD-10-CM

## 2013-08-04 DIAGNOSIS — E1139 Type 2 diabetes mellitus with other diabetic ophthalmic complication: Secondary | ICD-10-CM | POA: Diagnosis not present

## 2013-08-04 DIAGNOSIS — Z171 Estrogen receptor negative status [ER-]: Secondary | ICD-10-CM | POA: Diagnosis not present

## 2013-08-04 DIAGNOSIS — C50519 Malignant neoplasm of lower-outer quadrant of unspecified female breast: Secondary | ICD-10-CM | POA: Diagnosis not present

## 2013-08-04 DIAGNOSIS — Z510 Encounter for antineoplastic radiation therapy: Secondary | ICD-10-CM | POA: Diagnosis not present

## 2013-08-04 DIAGNOSIS — I5032 Chronic diastolic (congestive) heart failure: Secondary | ICD-10-CM | POA: Diagnosis not present

## 2013-08-04 NOTE — Telephone Encounter (Signed)
LEFT MESSAGE FOR PT  TO CALL BACK .Adonis Housekeeper

## 2013-08-04 NOTE — Telephone Encounter (Signed)
Message copied by Richmond Campbell on Wed Aug 04, 2013 11:18 AM ------      Message from: Josue Hector      Created: Mon Aug 02, 2013  5:12 PM       In January there was no evidence of tamponade and she is asymptomatic  Will arrange f/u echo since its been 6 months            ----- Message -----         From: Chauncey Cruel, MD         Sent: 08/02/2013   9:13 AM           To: Josue Hector, MD            Collier [place]ina, just got called by Ms [facility] radiation oncologist, Dr Meda Coffee, who noted a "large" pericardial effusion in his SIM chest CT. Looking at your June note, you-all have been aware of her having a "moderate" effusion. The pt is entirely asymptomatic. Is this something we need to work up further or just follow?            GM       ------

## 2013-08-05 DIAGNOSIS — I5032 Chronic diastolic (congestive) heart failure: Secondary | ICD-10-CM | POA: Diagnosis not present

## 2013-08-05 DIAGNOSIS — E1139 Type 2 diabetes mellitus with other diabetic ophthalmic complication: Secondary | ICD-10-CM | POA: Diagnosis not present

## 2013-08-05 DIAGNOSIS — Z171 Estrogen receptor negative status [ER-]: Secondary | ICD-10-CM | POA: Diagnosis not present

## 2013-08-05 DIAGNOSIS — C50519 Malignant neoplasm of lower-outer quadrant of unspecified female breast: Secondary | ICD-10-CM | POA: Diagnosis not present

## 2013-08-05 DIAGNOSIS — Z510 Encounter for antineoplastic radiation therapy: Secondary | ICD-10-CM | POA: Diagnosis not present

## 2013-08-05 DIAGNOSIS — E11319 Type 2 diabetes mellitus with unspecified diabetic retinopathy without macular edema: Secondary | ICD-10-CM | POA: Diagnosis not present

## 2013-08-06 ENCOUNTER — Ambulatory Visit (HOSPITAL_COMMUNITY): Payer: Medicare Other | Attending: Internal Medicine | Admitting: Cardiology

## 2013-08-06 DIAGNOSIS — E11319 Type 2 diabetes mellitus with unspecified diabetic retinopathy without macular edema: Secondary | ICD-10-CM | POA: Diagnosis not present

## 2013-08-06 DIAGNOSIS — I314 Cardiac tamponade: Secondary | ICD-10-CM

## 2013-08-06 DIAGNOSIS — I517 Cardiomegaly: Secondary | ICD-10-CM | POA: Insufficient documentation

## 2013-08-06 DIAGNOSIS — I5033 Acute on chronic diastolic (congestive) heart failure: Secondary | ICD-10-CM

## 2013-08-06 DIAGNOSIS — E1139 Type 2 diabetes mellitus with other diabetic ophthalmic complication: Secondary | ICD-10-CM | POA: Diagnosis not present

## 2013-08-06 DIAGNOSIS — I10 Essential (primary) hypertension: Secondary | ICD-10-CM | POA: Insufficient documentation

## 2013-08-06 DIAGNOSIS — E039 Hypothyroidism, unspecified: Secondary | ICD-10-CM | POA: Diagnosis not present

## 2013-08-06 DIAGNOSIS — I079 Rheumatic tricuspid valve disease, unspecified: Secondary | ICD-10-CM | POA: Diagnosis not present

## 2013-08-06 DIAGNOSIS — I509 Heart failure, unspecified: Secondary | ICD-10-CM | POA: Diagnosis not present

## 2013-08-06 DIAGNOSIS — E669 Obesity, unspecified: Secondary | ICD-10-CM | POA: Insufficient documentation

## 2013-08-06 DIAGNOSIS — N2581 Secondary hyperparathyroidism of renal origin: Secondary | ICD-10-CM | POA: Diagnosis not present

## 2013-08-06 DIAGNOSIS — I5032 Chronic diastolic (congestive) heart failure: Secondary | ICD-10-CM | POA: Diagnosis not present

## 2013-08-06 DIAGNOSIS — C50519 Malignant neoplasm of lower-outer quadrant of unspecified female breast: Secondary | ICD-10-CM | POA: Diagnosis not present

## 2013-08-06 DIAGNOSIS — E119 Type 2 diabetes mellitus without complications: Secondary | ICD-10-CM | POA: Diagnosis not present

## 2013-08-06 DIAGNOSIS — C50919 Malignant neoplasm of unspecified site of unspecified female breast: Secondary | ICD-10-CM | POA: Diagnosis not present

## 2013-08-06 DIAGNOSIS — I059 Rheumatic mitral valve disease, unspecified: Secondary | ICD-10-CM | POA: Insufficient documentation

## 2013-08-06 DIAGNOSIS — I428 Other cardiomyopathies: Secondary | ICD-10-CM | POA: Insufficient documentation

## 2013-08-06 DIAGNOSIS — I2510 Atherosclerotic heart disease of native coronary artery without angina pectoris: Secondary | ICD-10-CM | POA: Insufficient documentation

## 2013-08-06 DIAGNOSIS — Z510 Encounter for antineoplastic radiation therapy: Secondary | ICD-10-CM | POA: Diagnosis not present

## 2013-08-06 DIAGNOSIS — Z171 Estrogen receptor negative status [ER-]: Secondary | ICD-10-CM | POA: Diagnosis not present

## 2013-08-06 DIAGNOSIS — Z940 Kidney transplant status: Secondary | ICD-10-CM | POA: Diagnosis not present

## 2013-08-06 NOTE — Progress Notes (Signed)
Echo performed. 

## 2013-08-06 NOTE — Telephone Encounter (Signed)
PT  AWARE./CY 

## 2013-08-09 DIAGNOSIS — Z171 Estrogen receptor negative status [ER-]: Secondary | ICD-10-CM | POA: Diagnosis not present

## 2013-08-09 DIAGNOSIS — C50519 Malignant neoplasm of lower-outer quadrant of unspecified female breast: Secondary | ICD-10-CM | POA: Diagnosis not present

## 2013-08-09 DIAGNOSIS — E1139 Type 2 diabetes mellitus with other diabetic ophthalmic complication: Secondary | ICD-10-CM | POA: Diagnosis not present

## 2013-08-09 DIAGNOSIS — E11319 Type 2 diabetes mellitus with unspecified diabetic retinopathy without macular edema: Secondary | ICD-10-CM | POA: Diagnosis not present

## 2013-08-09 DIAGNOSIS — I5032 Chronic diastolic (congestive) heart failure: Secondary | ICD-10-CM | POA: Diagnosis not present

## 2013-08-09 DIAGNOSIS — Z510 Encounter for antineoplastic radiation therapy: Secondary | ICD-10-CM | POA: Diagnosis not present

## 2013-08-10 DIAGNOSIS — E11319 Type 2 diabetes mellitus with unspecified diabetic retinopathy without macular edema: Secondary | ICD-10-CM | POA: Diagnosis not present

## 2013-08-10 DIAGNOSIS — E1139 Type 2 diabetes mellitus with other diabetic ophthalmic complication: Secondary | ICD-10-CM | POA: Diagnosis not present

## 2013-08-10 DIAGNOSIS — Z510 Encounter for antineoplastic radiation therapy: Secondary | ICD-10-CM | POA: Diagnosis not present

## 2013-08-10 DIAGNOSIS — I5032 Chronic diastolic (congestive) heart failure: Secondary | ICD-10-CM | POA: Diagnosis not present

## 2013-08-10 DIAGNOSIS — C50519 Malignant neoplasm of lower-outer quadrant of unspecified female breast: Secondary | ICD-10-CM | POA: Diagnosis not present

## 2013-08-10 DIAGNOSIS — Z171 Estrogen receptor negative status [ER-]: Secondary | ICD-10-CM | POA: Diagnosis not present

## 2013-08-11 DIAGNOSIS — Z510 Encounter for antineoplastic radiation therapy: Secondary | ICD-10-CM | POA: Diagnosis not present

## 2013-08-11 DIAGNOSIS — Z171 Estrogen receptor negative status [ER-]: Secondary | ICD-10-CM | POA: Diagnosis not present

## 2013-08-11 DIAGNOSIS — E11319 Type 2 diabetes mellitus with unspecified diabetic retinopathy without macular edema: Secondary | ICD-10-CM | POA: Diagnosis not present

## 2013-08-11 DIAGNOSIS — I5032 Chronic diastolic (congestive) heart failure: Secondary | ICD-10-CM | POA: Diagnosis not present

## 2013-08-11 DIAGNOSIS — C50519 Malignant neoplasm of lower-outer quadrant of unspecified female breast: Secondary | ICD-10-CM | POA: Diagnosis not present

## 2013-08-11 DIAGNOSIS — E1139 Type 2 diabetes mellitus with other diabetic ophthalmic complication: Secondary | ICD-10-CM | POA: Diagnosis not present

## 2013-08-12 DIAGNOSIS — Z171 Estrogen receptor negative status [ER-]: Secondary | ICD-10-CM | POA: Diagnosis not present

## 2013-08-12 DIAGNOSIS — C50519 Malignant neoplasm of lower-outer quadrant of unspecified female breast: Secondary | ICD-10-CM | POA: Diagnosis not present

## 2013-08-12 DIAGNOSIS — Z510 Encounter for antineoplastic radiation therapy: Secondary | ICD-10-CM | POA: Diagnosis not present

## 2013-08-12 DIAGNOSIS — I5032 Chronic diastolic (congestive) heart failure: Secondary | ICD-10-CM | POA: Diagnosis not present

## 2013-08-12 DIAGNOSIS — E1139 Type 2 diabetes mellitus with other diabetic ophthalmic complication: Secondary | ICD-10-CM | POA: Diagnosis not present

## 2013-08-12 DIAGNOSIS — E11319 Type 2 diabetes mellitus with unspecified diabetic retinopathy without macular edema: Secondary | ICD-10-CM | POA: Diagnosis not present

## 2013-08-13 DIAGNOSIS — E11319 Type 2 diabetes mellitus with unspecified diabetic retinopathy without macular edema: Secondary | ICD-10-CM | POA: Diagnosis not present

## 2013-08-13 DIAGNOSIS — E1139 Type 2 diabetes mellitus with other diabetic ophthalmic complication: Secondary | ICD-10-CM | POA: Diagnosis not present

## 2013-08-13 DIAGNOSIS — C50519 Malignant neoplasm of lower-outer quadrant of unspecified female breast: Secondary | ICD-10-CM | POA: Diagnosis not present

## 2013-08-13 DIAGNOSIS — I5032 Chronic diastolic (congestive) heart failure: Secondary | ICD-10-CM | POA: Diagnosis not present

## 2013-08-13 DIAGNOSIS — Z510 Encounter for antineoplastic radiation therapy: Secondary | ICD-10-CM | POA: Diagnosis not present

## 2013-08-13 DIAGNOSIS — Z171 Estrogen receptor negative status [ER-]: Secondary | ICD-10-CM | POA: Diagnosis not present

## 2013-08-16 DIAGNOSIS — E1139 Type 2 diabetes mellitus with other diabetic ophthalmic complication: Secondary | ICD-10-CM | POA: Diagnosis not present

## 2013-08-16 DIAGNOSIS — Z171 Estrogen receptor negative status [ER-]: Secondary | ICD-10-CM | POA: Diagnosis not present

## 2013-08-16 DIAGNOSIS — I509 Heart failure, unspecified: Secondary | ICD-10-CM | POA: Diagnosis not present

## 2013-08-16 DIAGNOSIS — Z794 Long term (current) use of insulin: Secondary | ICD-10-CM | POA: Diagnosis not present

## 2013-08-16 DIAGNOSIS — I10 Essential (primary) hypertension: Secondary | ICD-10-CM | POA: Diagnosis not present

## 2013-08-16 DIAGNOSIS — I5032 Chronic diastolic (congestive) heart failure: Secondary | ICD-10-CM | POA: Diagnosis not present

## 2013-08-16 DIAGNOSIS — C50519 Malignant neoplasm of lower-outer quadrant of unspecified female breast: Secondary | ICD-10-CM | POA: Diagnosis not present

## 2013-08-16 DIAGNOSIS — Z510 Encounter for antineoplastic radiation therapy: Secondary | ICD-10-CM | POA: Diagnosis not present

## 2013-08-16 DIAGNOSIS — E11319 Type 2 diabetes mellitus with unspecified diabetic retinopathy without macular edema: Secondary | ICD-10-CM | POA: Diagnosis not present

## 2013-08-17 DIAGNOSIS — E1139 Type 2 diabetes mellitus with other diabetic ophthalmic complication: Secondary | ICD-10-CM | POA: Diagnosis not present

## 2013-08-17 DIAGNOSIS — Z171 Estrogen receptor negative status [ER-]: Secondary | ICD-10-CM | POA: Diagnosis not present

## 2013-08-17 DIAGNOSIS — E11319 Type 2 diabetes mellitus with unspecified diabetic retinopathy without macular edema: Secondary | ICD-10-CM | POA: Diagnosis not present

## 2013-08-17 DIAGNOSIS — C50519 Malignant neoplasm of lower-outer quadrant of unspecified female breast: Secondary | ICD-10-CM | POA: Diagnosis not present

## 2013-08-17 DIAGNOSIS — Z510 Encounter for antineoplastic radiation therapy: Secondary | ICD-10-CM | POA: Diagnosis not present

## 2013-08-17 DIAGNOSIS — Z794 Long term (current) use of insulin: Secondary | ICD-10-CM | POA: Diagnosis not present

## 2013-08-18 ENCOUNTER — Telehealth: Payer: Self-pay | Admitting: Cardiovascular Disease

## 2013-08-18 DIAGNOSIS — C50519 Malignant neoplasm of lower-outer quadrant of unspecified female breast: Secondary | ICD-10-CM | POA: Diagnosis not present

## 2013-08-18 DIAGNOSIS — Z510 Encounter for antineoplastic radiation therapy: Secondary | ICD-10-CM | POA: Diagnosis not present

## 2013-08-18 DIAGNOSIS — E1139 Type 2 diabetes mellitus with other diabetic ophthalmic complication: Secondary | ICD-10-CM | POA: Diagnosis not present

## 2013-08-18 DIAGNOSIS — Z794 Long term (current) use of insulin: Secondary | ICD-10-CM | POA: Diagnosis not present

## 2013-08-18 DIAGNOSIS — Z171 Estrogen receptor negative status [ER-]: Secondary | ICD-10-CM | POA: Diagnosis not present

## 2013-08-18 DIAGNOSIS — E11319 Type 2 diabetes mellitus with unspecified diabetic retinopathy without macular edema: Secondary | ICD-10-CM | POA: Diagnosis not present

## 2013-08-18 NOTE — Telephone Encounter (Signed)
PT AWARE OF ECHO RESULTS./CY 

## 2013-08-18 NOTE — Telephone Encounter (Signed)
New message ° ° ° ° °Want test results °

## 2013-08-19 DIAGNOSIS — E11319 Type 2 diabetes mellitus with unspecified diabetic retinopathy without macular edema: Secondary | ICD-10-CM | POA: Diagnosis not present

## 2013-08-19 DIAGNOSIS — Z171 Estrogen receptor negative status [ER-]: Secondary | ICD-10-CM | POA: Diagnosis not present

## 2013-08-19 DIAGNOSIS — E1139 Type 2 diabetes mellitus with other diabetic ophthalmic complication: Secondary | ICD-10-CM | POA: Diagnosis not present

## 2013-08-19 DIAGNOSIS — C50519 Malignant neoplasm of lower-outer quadrant of unspecified female breast: Secondary | ICD-10-CM | POA: Diagnosis not present

## 2013-08-19 DIAGNOSIS — Z794 Long term (current) use of insulin: Secondary | ICD-10-CM | POA: Diagnosis not present

## 2013-08-19 DIAGNOSIS — Z510 Encounter for antineoplastic radiation therapy: Secondary | ICD-10-CM | POA: Diagnosis not present

## 2013-08-20 DIAGNOSIS — Z794 Long term (current) use of insulin: Secondary | ICD-10-CM | POA: Diagnosis not present

## 2013-08-20 DIAGNOSIS — Z510 Encounter for antineoplastic radiation therapy: Secondary | ICD-10-CM | POA: Diagnosis not present

## 2013-08-20 DIAGNOSIS — Z171 Estrogen receptor negative status [ER-]: Secondary | ICD-10-CM | POA: Diagnosis not present

## 2013-08-20 DIAGNOSIS — E11319 Type 2 diabetes mellitus with unspecified diabetic retinopathy without macular edema: Secondary | ICD-10-CM | POA: Diagnosis not present

## 2013-08-20 DIAGNOSIS — E1139 Type 2 diabetes mellitus with other diabetic ophthalmic complication: Secondary | ICD-10-CM | POA: Diagnosis not present

## 2013-08-20 DIAGNOSIS — C50519 Malignant neoplasm of lower-outer quadrant of unspecified female breast: Secondary | ICD-10-CM | POA: Diagnosis not present

## 2013-08-23 DIAGNOSIS — E11319 Type 2 diabetes mellitus with unspecified diabetic retinopathy without macular edema: Secondary | ICD-10-CM | POA: Diagnosis not present

## 2013-08-23 DIAGNOSIS — Z171 Estrogen receptor negative status [ER-]: Secondary | ICD-10-CM | POA: Diagnosis not present

## 2013-08-23 DIAGNOSIS — Z794 Long term (current) use of insulin: Secondary | ICD-10-CM | POA: Diagnosis not present

## 2013-08-23 DIAGNOSIS — Z510 Encounter for antineoplastic radiation therapy: Secondary | ICD-10-CM | POA: Diagnosis not present

## 2013-08-23 DIAGNOSIS — C50519 Malignant neoplasm of lower-outer quadrant of unspecified female breast: Secondary | ICD-10-CM | POA: Diagnosis not present

## 2013-08-23 DIAGNOSIS — E1139 Type 2 diabetes mellitus with other diabetic ophthalmic complication: Secondary | ICD-10-CM | POA: Diagnosis not present

## 2013-08-24 DIAGNOSIS — Z510 Encounter for antineoplastic radiation therapy: Secondary | ICD-10-CM | POA: Diagnosis not present

## 2013-08-24 DIAGNOSIS — E1139 Type 2 diabetes mellitus with other diabetic ophthalmic complication: Secondary | ICD-10-CM | POA: Diagnosis not present

## 2013-08-24 DIAGNOSIS — Z171 Estrogen receptor negative status [ER-]: Secondary | ICD-10-CM | POA: Diagnosis not present

## 2013-08-24 DIAGNOSIS — E11319 Type 2 diabetes mellitus with unspecified diabetic retinopathy without macular edema: Secondary | ICD-10-CM | POA: Diagnosis not present

## 2013-08-24 DIAGNOSIS — C50519 Malignant neoplasm of lower-outer quadrant of unspecified female breast: Secondary | ICD-10-CM | POA: Diagnosis not present

## 2013-08-24 DIAGNOSIS — Z794 Long term (current) use of insulin: Secondary | ICD-10-CM | POA: Diagnosis not present

## 2013-08-25 DIAGNOSIS — C50519 Malignant neoplasm of lower-outer quadrant of unspecified female breast: Secondary | ICD-10-CM | POA: Diagnosis not present

## 2013-08-25 DIAGNOSIS — Z171 Estrogen receptor negative status [ER-]: Secondary | ICD-10-CM | POA: Diagnosis not present

## 2013-08-25 DIAGNOSIS — Z794 Long term (current) use of insulin: Secondary | ICD-10-CM | POA: Diagnosis not present

## 2013-08-25 DIAGNOSIS — E11319 Type 2 diabetes mellitus with unspecified diabetic retinopathy without macular edema: Secondary | ICD-10-CM | POA: Diagnosis not present

## 2013-08-25 DIAGNOSIS — E1139 Type 2 diabetes mellitus with other diabetic ophthalmic complication: Secondary | ICD-10-CM | POA: Diagnosis not present

## 2013-08-25 DIAGNOSIS — Z510 Encounter for antineoplastic radiation therapy: Secondary | ICD-10-CM | POA: Diagnosis not present

## 2013-08-26 DIAGNOSIS — Z510 Encounter for antineoplastic radiation therapy: Secondary | ICD-10-CM | POA: Diagnosis not present

## 2013-08-26 DIAGNOSIS — E11319 Type 2 diabetes mellitus with unspecified diabetic retinopathy without macular edema: Secondary | ICD-10-CM | POA: Diagnosis not present

## 2013-08-26 DIAGNOSIS — Z794 Long term (current) use of insulin: Secondary | ICD-10-CM | POA: Diagnosis not present

## 2013-08-26 DIAGNOSIS — E1139 Type 2 diabetes mellitus with other diabetic ophthalmic complication: Secondary | ICD-10-CM | POA: Diagnosis not present

## 2013-08-26 DIAGNOSIS — Z171 Estrogen receptor negative status [ER-]: Secondary | ICD-10-CM | POA: Diagnosis not present

## 2013-08-26 DIAGNOSIS — C50519 Malignant neoplasm of lower-outer quadrant of unspecified female breast: Secondary | ICD-10-CM | POA: Diagnosis not present

## 2013-08-27 DIAGNOSIS — Z510 Encounter for antineoplastic radiation therapy: Secondary | ICD-10-CM | POA: Diagnosis not present

## 2013-08-27 DIAGNOSIS — Z794 Long term (current) use of insulin: Secondary | ICD-10-CM | POA: Diagnosis not present

## 2013-08-27 DIAGNOSIS — C50519 Malignant neoplasm of lower-outer quadrant of unspecified female breast: Secondary | ICD-10-CM | POA: Diagnosis not present

## 2013-08-27 DIAGNOSIS — E1139 Type 2 diabetes mellitus with other diabetic ophthalmic complication: Secondary | ICD-10-CM | POA: Diagnosis not present

## 2013-08-27 DIAGNOSIS — E11319 Type 2 diabetes mellitus with unspecified diabetic retinopathy without macular edema: Secondary | ICD-10-CM | POA: Diagnosis not present

## 2013-08-27 DIAGNOSIS — Z171 Estrogen receptor negative status [ER-]: Secondary | ICD-10-CM | POA: Diagnosis not present

## 2013-08-30 ENCOUNTER — Telehealth: Payer: Self-pay | Admitting: Internal Medicine

## 2013-08-30 DIAGNOSIS — M109 Gout, unspecified: Secondary | ICD-10-CM | POA: Diagnosis not present

## 2013-08-30 DIAGNOSIS — E11319 Type 2 diabetes mellitus with unspecified diabetic retinopathy without macular edema: Secondary | ICD-10-CM | POA: Diagnosis not present

## 2013-08-30 DIAGNOSIS — N183 Chronic kidney disease, stage 3 unspecified: Secondary | ICD-10-CM | POA: Diagnosis not present

## 2013-08-30 DIAGNOSIS — Z510 Encounter for antineoplastic radiation therapy: Secondary | ICD-10-CM | POA: Diagnosis not present

## 2013-08-30 DIAGNOSIS — Z171 Estrogen receptor negative status [ER-]: Secondary | ICD-10-CM | POA: Diagnosis not present

## 2013-08-30 DIAGNOSIS — N2581 Secondary hyperparathyroidism of renal origin: Secondary | ICD-10-CM | POA: Diagnosis not present

## 2013-08-30 DIAGNOSIS — Z794 Long term (current) use of insulin: Secondary | ICD-10-CM | POA: Diagnosis not present

## 2013-08-30 DIAGNOSIS — I129 Hypertensive chronic kidney disease with stage 1 through stage 4 chronic kidney disease, or unspecified chronic kidney disease: Secondary | ICD-10-CM | POA: Diagnosis not present

## 2013-08-30 DIAGNOSIS — E118 Type 2 diabetes mellitus with unspecified complications: Secondary | ICD-10-CM | POA: Diagnosis not present

## 2013-08-30 DIAGNOSIS — C50519 Malignant neoplasm of lower-outer quadrant of unspecified female breast: Secondary | ICD-10-CM | POA: Diagnosis not present

## 2013-08-30 DIAGNOSIS — E785 Hyperlipidemia, unspecified: Secondary | ICD-10-CM | POA: Diagnosis not present

## 2013-08-30 DIAGNOSIS — Z940 Kidney transplant status: Secondary | ICD-10-CM | POA: Diagnosis not present

## 2013-08-30 DIAGNOSIS — E1139 Type 2 diabetes mellitus with other diabetic ophthalmic complication: Secondary | ICD-10-CM | POA: Diagnosis not present

## 2013-08-30 NOTE — Telephone Encounter (Signed)
Unfortunately office policy is or samples at [place] only  Perhaps she could apply for pt assist program ?

## 2013-08-30 NOTE — Telephone Encounter (Signed)
Pt came by to request samples of Rx LANTIS. Pt was informed that sample supply has changed and receiving samples is not guaranteed. Pt states she is completely out. Pt states she can not afford the prescription for more. Please contact pt when request is reviewed.

## 2013-08-31 DIAGNOSIS — Z794 Long term (current) use of insulin: Secondary | ICD-10-CM | POA: Diagnosis not present

## 2013-08-31 DIAGNOSIS — Z171 Estrogen receptor negative status [ER-]: Secondary | ICD-10-CM | POA: Diagnosis not present

## 2013-08-31 DIAGNOSIS — Z510 Encounter for antineoplastic radiation therapy: Secondary | ICD-10-CM | POA: Diagnosis not present

## 2013-08-31 DIAGNOSIS — E11319 Type 2 diabetes mellitus with unspecified diabetic retinopathy without macular edema: Secondary | ICD-10-CM | POA: Diagnosis not present

## 2013-08-31 DIAGNOSIS — E1139 Type 2 diabetes mellitus with other diabetic ophthalmic complication: Secondary | ICD-10-CM | POA: Diagnosis not present

## 2013-08-31 DIAGNOSIS — C50519 Malignant neoplasm of lower-outer quadrant of unspecified female breast: Secondary | ICD-10-CM | POA: Diagnosis not present

## 2013-09-01 DIAGNOSIS — E1139 Type 2 diabetes mellitus with other diabetic ophthalmic complication: Secondary | ICD-10-CM | POA: Diagnosis not present

## 2013-09-01 DIAGNOSIS — Z794 Long term (current) use of insulin: Secondary | ICD-10-CM | POA: Diagnosis not present

## 2013-09-01 DIAGNOSIS — C50519 Malignant neoplasm of lower-outer quadrant of unspecified female breast: Secondary | ICD-10-CM | POA: Diagnosis not present

## 2013-09-01 DIAGNOSIS — E11319 Type 2 diabetes mellitus with unspecified diabetic retinopathy without macular edema: Secondary | ICD-10-CM | POA: Diagnosis not present

## 2013-09-01 DIAGNOSIS — Z171 Estrogen receptor negative status [ER-]: Secondary | ICD-10-CM | POA: Diagnosis not present

## 2013-09-01 DIAGNOSIS — Z510 Encounter for antineoplastic radiation therapy: Secondary | ICD-10-CM | POA: Diagnosis not present

## 2013-09-02 DIAGNOSIS — Z510 Encounter for antineoplastic radiation therapy: Secondary | ICD-10-CM | POA: Diagnosis not present

## 2013-09-02 DIAGNOSIS — E1139 Type 2 diabetes mellitus with other diabetic ophthalmic complication: Secondary | ICD-10-CM | POA: Diagnosis not present

## 2013-09-02 DIAGNOSIS — Z171 Estrogen receptor negative status [ER-]: Secondary | ICD-10-CM | POA: Diagnosis not present

## 2013-09-02 DIAGNOSIS — E11319 Type 2 diabetes mellitus with unspecified diabetic retinopathy without macular edema: Secondary | ICD-10-CM | POA: Diagnosis not present

## 2013-09-02 DIAGNOSIS — C50519 Malignant neoplasm of lower-outer quadrant of unspecified female breast: Secondary | ICD-10-CM | POA: Diagnosis not present

## 2013-09-02 DIAGNOSIS — Z794 Long term (current) use of insulin: Secondary | ICD-10-CM | POA: Diagnosis not present

## 2013-09-03 DIAGNOSIS — Z794 Long term (current) use of insulin: Secondary | ICD-10-CM | POA: Diagnosis not present

## 2013-09-03 DIAGNOSIS — Z510 Encounter for antineoplastic radiation therapy: Secondary | ICD-10-CM | POA: Diagnosis not present

## 2013-09-03 DIAGNOSIS — E11319 Type 2 diabetes mellitus with unspecified diabetic retinopathy without macular edema: Secondary | ICD-10-CM | POA: Diagnosis not present

## 2013-09-03 DIAGNOSIS — Z171 Estrogen receptor negative status [ER-]: Secondary | ICD-10-CM | POA: Diagnosis not present

## 2013-09-03 DIAGNOSIS — C50519 Malignant neoplasm of lower-outer quadrant of unspecified female breast: Secondary | ICD-10-CM | POA: Diagnosis not present

## 2013-09-03 DIAGNOSIS — E1139 Type 2 diabetes mellitus with other diabetic ophthalmic complication: Secondary | ICD-10-CM | POA: Diagnosis not present

## 2013-09-06 ENCOUNTER — Telehealth: Payer: Self-pay | Admitting: Adult Health

## 2013-09-06 ENCOUNTER — Encounter: Payer: Self-pay | Admitting: Internal Medicine

## 2013-09-06 ENCOUNTER — Other Ambulatory Visit: Payer: Self-pay | Admitting: *Deleted

## 2013-09-06 ENCOUNTER — Other Ambulatory Visit (HOSPITAL_BASED_OUTPATIENT_CLINIC_OR_DEPARTMENT_OTHER): Payer: Medicare Other

## 2013-09-06 ENCOUNTER — Ambulatory Visit (HOSPITAL_BASED_OUTPATIENT_CLINIC_OR_DEPARTMENT_OTHER): Payer: Medicare Other | Admitting: Adult Health

## 2013-09-06 ENCOUNTER — Encounter: Payer: Self-pay | Admitting: Adult Health

## 2013-09-06 ENCOUNTER — Ambulatory Visit: Payer: Medicare Other | Admitting: Internal Medicine

## 2013-09-06 ENCOUNTER — Ambulatory Visit (HOSPITAL_BASED_OUTPATIENT_CLINIC_OR_DEPARTMENT_OTHER): Payer: Medicare Other

## 2013-09-06 VITALS — BP 134/58 | HR 74 | Temp 98.2°F | Resp 18 | Ht 65.0 in | Wt 187.2 lb

## 2013-09-06 DIAGNOSIS — Z794 Long term (current) use of insulin: Secondary | ICD-10-CM | POA: Diagnosis not present

## 2013-09-06 DIAGNOSIS — E1139 Type 2 diabetes mellitus with other diabetic ophthalmic complication: Secondary | ICD-10-CM | POA: Diagnosis not present

## 2013-09-06 DIAGNOSIS — C50519 Malignant neoplasm of lower-outer quadrant of unspecified female breast: Secondary | ICD-10-CM | POA: Diagnosis not present

## 2013-09-06 DIAGNOSIS — E119 Type 2 diabetes mellitus without complications: Secondary | ICD-10-CM

## 2013-09-06 DIAGNOSIS — Z95828 Presence of other vascular implants and grafts: Secondary | ICD-10-CM

## 2013-09-06 DIAGNOSIS — E1159 Type 2 diabetes mellitus with other circulatory complications: Secondary | ICD-10-CM

## 2013-09-06 DIAGNOSIS — Z171 Estrogen receptor negative status [ER-]: Secondary | ICD-10-CM

## 2013-09-06 DIAGNOSIS — C50511 Malignant neoplasm of lower-outer quadrant of right female breast: Secondary | ICD-10-CM

## 2013-09-06 DIAGNOSIS — E11319 Type 2 diabetes mellitus with unspecified diabetic retinopathy without macular edema: Secondary | ICD-10-CM | POA: Diagnosis not present

## 2013-09-06 DIAGNOSIS — Z510 Encounter for antineoplastic radiation therapy: Secondary | ICD-10-CM | POA: Diagnosis not present

## 2013-09-06 LAB — CBC WITH DIFFERENTIAL/PLATELET
BASO%: 0.8 % (ref 0.0–2.0)
Basophils Absolute: 0 10*3/uL (ref 0.0–0.1)
EOS%: 0.9 % (ref 0.0–7.0)
Eosinophils Absolute: 0 10*3/uL (ref 0.0–0.5)
HCT: 38.2 % (ref 34.8–46.6)
HGB: 12.2 g/dL (ref 11.6–15.9)
LYMPH#: 0.5 10*3/uL — AB (ref 0.9–3.3)
LYMPH%: 9.5 % — ABNORMAL LOW (ref 14.0–49.7)
MCH: 28.9 pg (ref 25.1–34.0)
MCHC: 32.1 g/dL (ref 31.5–36.0)
MCV: 90.1 fL (ref 79.5–101.0)
MONO#: 0.6 10*3/uL (ref 0.1–0.9)
MONO%: 12 % (ref 0.0–14.0)
NEUT#: 4 10*3/uL (ref 1.5–6.5)
NEUT%: 76.8 % (ref 38.4–76.8)
Platelets: 169 10*3/uL (ref 145–400)
RBC: 4.24 10*6/uL (ref 3.70–5.45)
RDW: 17.9 % — AB (ref 11.2–14.5)
WBC: 5.3 10*3/uL (ref 3.9–10.3)

## 2013-09-06 LAB — COMPREHENSIVE METABOLIC PANEL (CC13)
ALBUMIN: 3.8 g/dL (ref 3.5–5.0)
ALK PHOS: 50 U/L (ref 40–150)
ALT: 8 U/L (ref 0–55)
AST: 15 U/L (ref 5–34)
Anion Gap: 10 mEq/L (ref 3–11)
BUN: 26.6 mg/dL — AB (ref 7.0–26.0)
CO2: 30 mEq/L — ABNORMAL HIGH (ref 22–29)
CREATININE: 1.2 mg/dL — AB (ref 0.6–1.1)
Calcium: 8.7 mg/dL (ref 8.4–10.4)
Chloride: 99 mEq/L (ref 98–109)
Glucose: 416 mg/dl — ABNORMAL HIGH (ref 70–140)
POTASSIUM: 3.9 meq/L (ref 3.5–5.1)
Sodium: 139 mEq/L (ref 136–145)
Total Bilirubin: 0.46 mg/dL (ref 0.20–1.20)
Total Protein: 7 g/dL (ref 6.4–8.3)

## 2013-09-06 MED ORDER — SODIUM CHLORIDE 0.9 % IJ SOLN
10.0000 mL | INTRAMUSCULAR | Status: DC | PRN
  Administered 2013-09-06: 10 mL via INTRAVENOUS
  Filled 2013-09-06: qty 10

## 2013-09-06 MED ORDER — HEPARIN SOD (PORK) LOCK FLUSH 100 UNIT/ML IV SOLN
500.0000 [IU] | Freq: Once | INTRAVENOUS | Status: AC
  Administered 2013-09-06: 500 [IU] via INTRAVENOUS
  Filled 2013-09-06: qty 5

## 2013-09-06 NOTE — Progress Notes (Signed)
[place]  [phone] [phone]     ID: [name] OB: Sep 07, 1950  MR#: [id number]  OQH#:[id number]  PCP: [name], MD GYN:   SUGerald Stabs Streck] OTHER MD: Theodoro Kos, Arloa Koh  CHIEF COMPLAINT: Follow up for breast cancer.    BREAST CANCER HISTORY: The patient herself palpated a mass in her right breast. She brought it to medical attention at the time of her routinely scheduled mammogram 10/08/2012. This showed an irregular mass in the right breast lower outer quadrant which by ultrasound measured 2.1 cm. Biopsy of the mass was performed the same day, and showed ([id number]) invasive ductal carcinoma, grade 3, triple negative, with an MIB-1 of 56%. She proceeded to right lumpectomy and sentinel lymph node sampling 11/09/2012 with the pathology([id number] 14-04/07/2001 showing invasive ductal carcinoma, grade 3, measuring 2.2 cm, with negative margins, and all 3 sentinel lymph nodes benign. Repeat prognostic panel was again triple negative. The HER-2 signals ratio was 1.38 and the number per cell 2.2.  Her subsequent history is as detailed below  INTERVAL HISTORY: Ms. Wollin returns today for followup of her breast cancer. She has been receiving radiation five days a week in [place].  She is tolerating this moderately well.  Her skin is darker but isn't peeling.  She denies fevers, chills, nausea, vomiting, bowel/bladder changes, new pain, headaches, weakness, numbness, or any further concerns.    REVIEW OF SYSTEMS: A 10 point review of systems was conducted and is otherwise negative except for what is noted above.     PAST MEDICAL HISTORY: Past Medical History  Diagnosis Date  . S/P kidney transplant     CADAVERIC --  05/2006  . Hyperlipidemia   . DJD (degenerative joint disease)   . Hx of colonic polyps   . Diabetic retinopathy     RIGHT EYE  . Hypertension   . Asthma   . Blind left eye     OCCLUDED CORNEA,  FUNDI--   FAILED SURGERY REPAIR 2003  . Chronic diastolic CHF (congestive heart failure)     a. Cardiac MRI (10/2012): Moderate to severe LVH, EF 55%, chordal SAM but no LVOT gradient, mod circumferential effusion, mild LAE.  b.  Echocardiogram (11/06/12): Severe LVH, EF 27-51%, grade 1 diastolic dysfunction, moderate effusion.  . Wears glasses   . History of myocardial infarction     2011--  S/P CARDIAC CATH ([place])  NON-OBSTRUCTIVE CAD  . GERD (gastroesophageal reflux disease)   . History of kidney stones   . History of CVA (cerebrovascular accident) without residual deficits     2008  AND TIA IN 2008  W/ NO RESIDUAL  . History of peptic ulcer     2011--  RESOLVED  . 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 Gastroparesis due to DM   . Chronic kidney disease (CKD), stage III (moderate)     NEPHROLOGIST--  DR Freddrick March  . Open thigh wound     ANTERIOR  . Coronary artery disease CARDIOLOGIST--  DR Johnsie Cancel    NON-OBSTRUCTIVE CAD  PER CARDIAC CATH  2011  . Pericardial effusion without cardiac tamponade     PER DR NISHAN  NOTE --  NOT MALIGNANT  ? RELATED TO HYPOTHYROIDISM  . Thyromegaly     MULTINODULER GOITER  .  Hyperparathyroidism, secondary renal   . Anemia in chronic kidney disease   . Trigeminal neuralgia   . Hypothyroidism   . At risk for sleep apnea     STOP-BANG= 4   SENT TO PCP  03-25-2013    PAST SURGICAL HISTORY: Past Surgical History  Procedure Laterality Date  . Transplantation renal  05/2006    CADAVERIC  . Breast biopsy  2010  . Cholecystectomy  2008  . Hemorrhoid surgery  01/23/2012    Procedure: HEMORRHOIDECTOMY PROLAPSED;  Surgeon: Leighton Ruff, MD;  Location: [facility];  Service: General;  Laterality: N/A;  . Foot surgery Right 2013  . Breast lumpectomy with sentinel lymph node biopsy Right 11/09/2012      Procedure: RIGHT BREAST LUMPECTOMY WITH SENTINEL LYMPH NODE REMOVAL;  Surgeon: Haywood Lasso, MD;  Location: [facility];  Service: General;  Laterality: Right;  . Portacath placement Right 11/30/2012    Procedure: INSERTION PORT-A-CATH;  Surgeon: Haywood Lasso, MD;  Location: [facility];  Service: General;  Laterality: Right;  . Irrigation and debridement abscess Left 02/14/2013    Procedure: IRRIGATION AND DEBRIDEMENT LEFT THIGH ABSCESS;  Surgeon: Joyice Faster. Cornett, MD;  Location: [facility];  Service: General;  Laterality: Left;  . Incision and drainage of wound Left 02/18/2013    Procedure: IRRIGATION AND DEBRIDEMENT THIGH WOUND;  Surgeon: Rolm Bookbinder, MD;  Location: [facility];  Service: General;  Laterality: Left;  . Cataract extraction w/ intraocular lens  implant, bilateral    . Repair  intestinal perforation surgery  01-31-2009  . Cardiac catheterization  04-12-2009  DR EVELYNE GOUDREAU ([facility] IN [place], [place])    NON-OBSTRUCTIVE CAD/  mLAD 40%,  oLAD 50%,  dCFX 40%,  OM1  40%,  mRCA  &  dRCA  50%/  LVEF 65% /  ELEVATED  LVEDP  . Transthoracic echocardiogram  01-19-2013  DR NISHAN    SEVERE LVH/  EF 01-65%/  GRADE I DIASTOLIC DYSFUNCTION/  MODERATE LAE/  MILD IMPROVEMENT OF MODERATE CIRCUMFERENTIAL INFERIOROLATERAL PERICARDIAL EFFUSION WITH NO TAMPONADE  . Cardiovascular stress test  11-04-2012  DR NISHAN    HIGH RISK NUCLEAR STUDY/  FIXED DEFECT AFFECTING ENTIRE INFEROLATERAL AND ANTEROLATERAL WALL/  MODERATE ISCHEMIA  AT MID/ APICAL ANTEROR AND INFERIOR WALL/  GLOBAL HYPOKINESIS/  LVEF 28% (FELT TO BE FALSE +,  ECHO & cMRI  NORMAL EF AND NORMAL WM)  . Vaginal hysterectomy  1990's    W/  BILATERAL SALPINGOOPHORECTOMY  . Eye surgery Left 2003    REPAIR CORNEA  . Skin split graft Left 03/29/2013    Procedure: SKIN GRAFT SPLIT THICKNESS WITH A-CELL AND VAC TO LEFT THIGH;  Surgeon: Theodoro Kos, DO;  Location: [facility];  Service: Plastics;  Laterality: Left;     FAMILY HISTORY Family History  Problem Relation Age of Onset  . Kidney disease Neg Hx   . Arthritis Other     parent  . Heart disease Other     parent  . Hypertension Other     parent  . Diabetes Other     parent  . Diabetes Other     grandparent  . Thyroid disease Other     several  . Heart disease Father   The patient's father died at the age of 106 from a myocardial infarction. The patient's mother died in childbirth. The patient had 2 brothers, one sister. There is no history of breast or ovarian cancer in the family to her knowledge.  GYNECOLOGIC HISTORY:  Menarche age 23, first live birth age 8. The patient is GX P4. Gender 1 hysterectomy without salpingo-oophorectomy in the early 1990s. She did not take hormone replacement.   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 Her husband Wille Glaser used to work in Architect. He is also retired. It's just the 2 of them at home. She has 4 grandchildren. She attends a local [place] DIRECTIVES:    HEALTH MAINTENANCE: History  Substance Use Topics  . Smoking status: Never Smoker   . Smokeless tobacco: Never Used  . Alcohol Use: No     Colonoscopy:  PAP:  Bone density:  Lipid panel:  Allergies  Allergen Reactions  . Keflex [Cephalexin] Swelling    Swelling to face, chills Tolerates zosyn 02/14/13  . Propoxyphene N-Acetaminophen Hives and Itching    Current Outpatient Prescriptions  Medication Sig Dispense Refill  . amLODipine (NORVASC) 10 MG tablet Take 10 mg by mouth every morning.      Marland Kitchen aspirin EC 325 MG tablet Take 325 mg by mouth daily.        Marland Kitchen azaTHIOprine (IMURAN) 50 MG tablet Take 50 mg by mouth 2 (two) times daily.        . brimonidine (ALPHAGAN) 0.2 % ophthalmic solution Place 1 drop into the right eye 2 (two) times daily.       . cinacalcet (SENSIPAR) 30 MG tablet Take 30 mg by mouth every morning.      . docusate sodium (COLACE) 100 MG capsule Take 1 capsule (100  mg total) by mouth 2 (two) times daily.  60 capsule  0  . feeding supplement, GLUCERNA SHAKE, (GLUCERNA SHAKE) LIQD Take 237 mLs by mouth 2 (two) times daily between meals.    0  . furosemide (LASIX) 80 MG tablet Take 160 mg by mouth 2 (two) times daily.       . insulin aspart (NOVOLOG) 100 UNIT/ML injection Inject 13-16 Units into the skin 3 (three) times daily. Inject 15 units daily at breakfast, 15 units daily at lunch, and 17 units daily at supper.  3 vial  0  . insulin glargine (LANTUS) 100 UNIT/ML injection Inject 0.45 mLs (45 Units total) into the skin at bedtime.  20 mL  11  . isosorbide mononitrate (IMDUR) 60 MG 24 hr tablet Take 60 mg by mouth every morning.      Marland Kitchen KLOR-CON M20 20 MEQ tablet Takes 1/2 tab in morning and 1/2 at night.      . levothyroxine (SYNTHROID, LEVOTHROID) 75 MCG tablet Take 1 tablet (75 mcg total) by mouth daily.  90 tablet  3  . Magnesium Oxide 250 MG TABS Take 250 mg by mouth daily.       . metoprolol tartrate (LOPRESSOR) 25 MG tablet Take 1 tablet (25 mg total) by mouth 2 (two) times daily.  60 tablet  1  . minoxidil (LONITEN) 2.5 MG tablet Take 2.5 mg by mouth 2 (two) times daily.      . polyethylene glycol (MIRALAX / GLYCOLAX) packet Take 17 g by mouth daily.  14 each  11  . pravastatin (PRAVACHOL) 20 MG tablet Take 10 mg by mouth at bedtime.        . prednisoLONE acetate (PRED FORTE) 1 % ophthalmic suspension Place 1 drop into the left eye 2 (two) times daily.       . predniSONE (DELTASONE) 10 MG tablet Take 10 mg by mouth  every morning.       . tacrolimus (PROGRAF) 1 MG capsule Take 9 mg by mouth 2 (two) times daily.       . timolol (TIMOPTIC) 0.25 % ophthalmic solution Place 1 drop into the right eye 2 (two) times daily.      Marland Kitchen albuterol (ACCUNEB) 0.63 MG/3ML nebulizer solution Take 1 ampule by nebulization every 6 (six) hours as needed for wheezing.      Marland Kitchen albuterol (PROVENTIL HFA;VENTOLIN HFA) 108 (90 BASE) MCG/ACT inhaler Inhale 2 puffs into the lungs  every 6 (six) hours as needed for wheezing or shortness of breath.      . ketoconazole (NIZORAL) 2 % cream Apply 1 application topically daily.  15 g  0  . nitroGLYCERIN (NITROSTAT) 0.4 MG SL tablet Place 0.4 mg under the tongue every 5 (five) minutes as needed for chest pain.       No current facility-administered medications for this visit.    OBJECTIVE: Middle-aged [place] American woman in no acute distress  Filed Vitals:   09/06/13 1112  BP: 134/58  Pulse: 74  Temp: 98.2 F (36.8 C)  Resp: 18     Body mass index is 31.15 kg/(m^2).     GENERAL: Patient is a well appearing female in no acute distress HEENT:  Sclerae anicteric.  Oropharynx clear and moist. No ulcerations or evidence of oropharyngeal candidiasis. Neck is supple.  NODES:  No cervical, supraclavicular, or axillary lymphadenopathy palpated.  BREAST EXAM:  Right breast hyperpigmentation, no peeling visualized LUNGS:  Clear to auscultation bilaterally.  No wheezes or rhonchi. HEART:  Regular rate and rhythm. No murmur appreciated. ABDOMEN:  Soft, nontender.  Positive, normoactive bowel sounds. No organomegaly palpated. MSK:  No focal spinal tenderness to palpation. Full range of motion bilaterally in the upper extremities. EXTREMITIES:  No peripheral edema.   SKIN:  Clear with no obvious rashes or skin changes. No nail dyscrasia. NEURO:  Nonfocal. Well oriented.  Appropriate affect. ECOG FS:1 - Symptomatic but completely ambulatory    LAB RESULTS:  CMP     Component Value Date/Time   NA 139 09/06/2013 1045   NA 140 03/29/2013 1334   K 3.9 09/06/2013 1045   K 4.0 03/29/2013 1334   CL 98 03/29/2013 1334   CO2 30* 09/06/2013 1045   CO2 29 02/19/2013 0520   GLUCOSE 416* 09/06/2013 1045   GLUCOSE 117* 03/29/2013 1334   BUN 26.6* 09/06/2013 1045   BUN 20 03/29/2013 1334   CREATININE 1.2* 09/06/2013 1045   CREATININE 1.30* 03/29/2013 1334   CALCIUM 8.7 09/06/2013 1045   CALCIUM 8.2* 02/19/2013 0520   PROT 7.0 09/06/2013 1045    PROT 6.0 02/15/2013 0317   ALBUMIN 3.8 09/06/2013 1045   ALBUMIN 2.2* 02/15/2013 0317   AST 15 09/06/2013 1045   AST 26 02/15/2013 0317   ALT 8 09/06/2013 1045   ALT 16 02/15/2013 0317   ALKPHOS 50 09/06/2013 1045   ALKPHOS 69 02/15/2013 0317   BILITOT 0.46 09/06/2013 1045   BILITOT 0.4 02/15/2013 0317   GFRNONAA 60* 02/22/2013 1017   GFRAA 69* 02/22/2013 1017    I No results found for this basename: SPEP,  UPEP,   kappa and lambda light chains    Lab Results  Component Value Date   WBC 5.3 09/06/2013   NEUTROABS 4.0 09/06/2013   HGB 12.2 09/06/2013   HCT 38.2 09/06/2013   MCV 90.1 09/06/2013   PLT 169 09/06/2013      Chemistry  Component Value Date/Time   NA 139 09/06/2013 1045   NA 140 03/29/2013 1334   K 3.9 09/06/2013 1045   K 4.0 03/29/2013 1334   CL 98 03/29/2013 1334   CO2 30* 09/06/2013 1045   CO2 29 02/19/2013 0520   BUN 26.6* 09/06/2013 1045   BUN 20 03/29/2013 1334   CREATININE 1.2* 09/06/2013 1045   CREATININE 1.30* 03/29/2013 1334      Component Value Date/Time   CALCIUM 8.7 09/06/2013 1045   CALCIUM 8.2* 02/19/2013 0520   ALKPHOS 50 09/06/2013 1045   ALKPHOS 69 02/15/2013 0317   AST 15 09/06/2013 1045   AST 26 02/15/2013 0317   ALT 8 09/06/2013 1045   ALT 16 02/15/2013 0317   BILITOT 0.46 09/06/2013 1045   BILITOT 0.4 02/15/2013 0317       No results found for this basename: LABCA2    No components found with this basename: LABCA125    No results found for this basename: INR,  in the last 168 hours  Urinalysis    Component Value Date/Time   COLORURINE AMBER* 02/14/2013 0228   APPEARANCEUR CLOUDY* 02/14/2013 0228   LABSPEC 1.023 02/14/2013 0228   PHURINE 5.5 02/14/2013 0228   GLUCOSEU 250* 02/14/2013 0228   GLUCOSEU NEGATIVE 12/05/2011 1027   HGBUR TRACE* 02/14/2013 0228   BILIRUBINUR SMALL* 02/14/2013 0228   KETONESUR 15* 02/14/2013 0228   PROTEINUR 100* 02/14/2013 0228   UROBILINOGEN 1.0 02/14/2013 0228   NITRITE NEGATIVE 02/14/2013 0228   LEUKOCYTESUR NEGATIVE 02/14/2013 0228     STUDIES: No results found.  ASSESSMENT: 63 y.o. [place], [place] woman status post right lumpectomy and sentinel lymph node sampling 11/09/2012 for a pT2 pN0, stage IIA invasive ductal carcinoma, grade 3, triple negative, with an MIB-1 of 56%  (1) started adjuvant cyclophosphamide, methotrexate and fluorouracil 12-2012, received 3 cycles, then had a fall with injury to her left leg requiring skin grafting, and her subsequent cycles were delayed.  (2) resuming CMF treatment 05/24/2013, to be completed 07/05/2013  (3) adjuvant radiation in [place], [place] beginning August 09, 2013 to present  PLAN: Heer is doing moderately well today.  She is undergoing adjuvant radiation therapy in [place], [place] and is tolerating it well.  We reviewed the therapy she has received thus far in detail.    Monserrate has a blood glucose of 416 today.  She has had several sugary drinks today (Glucerna, juices) without insulin.  She should be taking Novolog TID.  She has no symptoms of hyperglycemia, and therefore will go and take her Novolog and f/u on her blood glucose in 30 minutes.  I gave her detailed information on what to do if her blood glucose remains elevated and signs and symptoms to proceed to the ER for.    Dorena will return for f/u and close observation in 3 months.   She knows to call us in the interim for any questions or concerns.  We can certainly see her sooner if needed.  I spent 25 minutes counseling the patient face to face.  The total time spent in the appointment was 30 minutes.  Minette Headland, [place] [phone] 09/06/2013 3:13 PM

## 2013-09-06 NOTE — Patient Instructions (Signed)
You are doing well.  Take your insulin when you get to your car and re-check your blood sugar.  If start to have any symptoms of an elevated blood glucose, please go to the emergency room.  Continue healthy diet, exercise and self breast exams.  Hyperglycemia Hyperglycemia occurs when the glucose (sugar) in your blood is too high. Hyperglycemia can happen for many reasons, but it most often happens to people who do not know they have diabetes or are not managing their diabetes properly.  CAUSES  Whether you have diabetes or not, there are other causes of hyperglycemia. Hyperglycemia can occur when you have diabetes, but it can also occur in other situations that you might not be as aware of, such as: Diabetes  If you have diabetes and are having problems controlling your blood glucose, hyperglycemia could occur because of some of the following reasons:  Not following your meal plan.  Not taking your diabetes medications or not taking it properly.  Exercising less or doing less activity than you normally do.  Being sick. Pre-diabetes  This cannot be ignored. Before people develop Type 2 diabetes, they almost always have "pre-diabetes." This is when your blood glucose levels are higher than normal, but not yet high enough to be diagnosed as diabetes. Research has shown that some long-term damage to the body, especially the heart and circulatory system, may already be occurring during pre-diabetes. If you take action to manage your blood glucose when you have pre-diabetes, you may delay or prevent Type 2 diabetes from developing. Stress  If you have diabetes, you may be "diet" controlled or on oral medications or insulin to control your diabetes. However, you may find that your blood glucose is higher than usual in the hospital whether you have diabetes or not. This is often referred to as "stress hyperglycemia." Stress can elevate your blood glucose. This happens because of hormones put out by  the body during times of stress. If stress has been the cause of your high blood glucose, it can be followed regularly by your caregiver. That way he/she can make sure your hyperglycemia does not continue to get worse or progress to diabetes. Steroids  Steroids are medications that act on the infection fighting system (immune system) to block inflammation or infection. One side effect can be a rise in blood glucose. Most people can produce enough extra insulin to allow for this rise, but for those who cannot, steroids make blood glucose levels go even higher. It is not unusual for steroid treatments to "uncover" diabetes that is developing. It is not always possible to determine if the hyperglycemia will go away after the steroids are stopped. A special blood test called an A1c is sometimes done to determine if your blood glucose was elevated before the steroids were started. SYMPTOMS  Thirsty.  Frequent urination.  Dry mouth.  Blurred vision.  Tired or fatigue.  Weakness.  Sleepy.  Tingling in feet or leg. DIAGNOSIS  Diagnosis is made by monitoring blood glucose in one or all of the following ways:  A1c test. This is a chemical found in your blood.  Fingerstick blood glucose monitoring.  Laboratory results. TREATMENT  First, knowing the cause of the hyperglycemia is important before the hyperglycemia can be treated. Treatment may include, but is not be limited to:  Education.  Change or adjustment in medications.  Change or adjustment in meal plan.  Treatment for an illness, infection, etc.  More frequent blood glucose monitoring.  Change in  exercise plan.  Decreasing or stopping steroids.  Lifestyle changes. HOME CARE INSTRUCTIONS   Test your blood glucose as directed.  Exercise regularly. Your caregiver will give you instructions about exercise. Pre-diabetes or diabetes which comes on with stress is helped by exercising.  Eat wholesome, balanced meals. Eat  often and at regular, fixed times. Your caregiver or nutritionist will give you a meal plan to guide your sugar intake.  Being at an ideal weight is important. If needed, losing as little as 10 to 15 pounds may help improve blood glucose levels. SEEK MEDICAL CARE IF:   You have questions about medicine, activity, or diet.  You continue to have symptoms (problems such as increased thirst, urination, or weight gain). SEEK IMMEDIATE MEDICAL CARE IF:   You are vomiting or have diarrhea.  Your breath smells fruity.  You are breathing faster or slower.  You are very sleepy or incoherent.  You have numbness, tingling, or pain in your feet or hands.  You have chest pain.  Your symptoms get worse even though you have been following your caregiver's orders.  If you have any other questions or concerns. Document Released: 06/26/2000 Document Revised: 03/25/2011 Document Reviewed: 04/29/2011 [facility] Patient Information 2015 [place], [place]. This information is not intended to replace advice given to you by your health care provider. Make sure you discuss any questions you have with your health care provider.

## 2013-09-06 NOTE — Progress Notes (Signed)
Received labs from [place] (Dr. Jimmy Footman) from 08/31/2013: - HbA1c 10.6%. Per dr's note, she ran out of Lantus and has not been taking it regularly before running out, either. He gave her samples. We may need to change her regimen to a more affordable one. Will scan the rest of the labs.

## 2013-09-06 NOTE — Telephone Encounter (Signed)
per pof to sch pt appt-sch 7 gave pt copy of sch

## 2013-09-06 NOTE — Patient Instructions (Signed)

## 2013-09-07 DIAGNOSIS — C50519 Malignant neoplasm of lower-outer quadrant of unspecified female breast: Secondary | ICD-10-CM | POA: Diagnosis not present

## 2013-09-07 DIAGNOSIS — E1139 Type 2 diabetes mellitus with other diabetic ophthalmic complication: Secondary | ICD-10-CM | POA: Diagnosis not present

## 2013-09-07 DIAGNOSIS — Z794 Long term (current) use of insulin: Secondary | ICD-10-CM | POA: Diagnosis not present

## 2013-09-07 DIAGNOSIS — Z171 Estrogen receptor negative status [ER-]: Secondary | ICD-10-CM | POA: Diagnosis not present

## 2013-09-07 DIAGNOSIS — E11319 Type 2 diabetes mellitus with unspecified diabetic retinopathy without macular edema: Secondary | ICD-10-CM | POA: Diagnosis not present

## 2013-09-07 DIAGNOSIS — Z510 Encounter for antineoplastic radiation therapy: Secondary | ICD-10-CM | POA: Diagnosis not present

## 2013-09-08 DIAGNOSIS — C50519 Malignant neoplasm of lower-outer quadrant of unspecified female breast: Secondary | ICD-10-CM | POA: Diagnosis not present

## 2013-09-08 DIAGNOSIS — E1139 Type 2 diabetes mellitus with other diabetic ophthalmic complication: Secondary | ICD-10-CM | POA: Diagnosis not present

## 2013-09-08 DIAGNOSIS — Z510 Encounter for antineoplastic radiation therapy: Secondary | ICD-10-CM | POA: Diagnosis not present

## 2013-09-08 DIAGNOSIS — E11319 Type 2 diabetes mellitus with unspecified diabetic retinopathy without macular edema: Secondary | ICD-10-CM | POA: Diagnosis not present

## 2013-09-08 DIAGNOSIS — Z794 Long term (current) use of insulin: Secondary | ICD-10-CM | POA: Diagnosis not present

## 2013-09-08 DIAGNOSIS — Z171 Estrogen receptor negative status [ER-]: Secondary | ICD-10-CM | POA: Diagnosis not present

## 2013-09-09 DIAGNOSIS — E11319 Type 2 diabetes mellitus with unspecified diabetic retinopathy without macular edema: Secondary | ICD-10-CM | POA: Diagnosis not present

## 2013-09-09 DIAGNOSIS — Z794 Long term (current) use of insulin: Secondary | ICD-10-CM | POA: Diagnosis not present

## 2013-09-09 DIAGNOSIS — E1139 Type 2 diabetes mellitus with other diabetic ophthalmic complication: Secondary | ICD-10-CM | POA: Diagnosis not present

## 2013-09-09 DIAGNOSIS — Z510 Encounter for antineoplastic radiation therapy: Secondary | ICD-10-CM | POA: Diagnosis not present

## 2013-09-09 DIAGNOSIS — C50519 Malignant neoplasm of lower-outer quadrant of unspecified female breast: Secondary | ICD-10-CM | POA: Diagnosis not present

## 2013-09-09 DIAGNOSIS — Z171 Estrogen receptor negative status [ER-]: Secondary | ICD-10-CM | POA: Diagnosis not present

## 2013-09-10 ENCOUNTER — Ambulatory Visit (INDEPENDENT_AMBULATORY_CARE_PROVIDER_SITE_OTHER): Payer: Medicare Other | Admitting: Adult Health

## 2013-09-10 ENCOUNTER — Ambulatory Visit: Payer: Medicare Other | Admitting: Adult Health

## 2013-09-10 ENCOUNTER — Encounter: Payer: Self-pay | Admitting: Adult Health

## 2013-09-10 VITALS — BP 128/74 | HR 78 | Temp 97.9°F | Ht 65.0 in | Wt 189.2 lb

## 2013-09-10 DIAGNOSIS — Z510 Encounter for antineoplastic radiation therapy: Secondary | ICD-10-CM | POA: Diagnosis not present

## 2013-09-10 DIAGNOSIS — G4733 Obstructive sleep apnea (adult) (pediatric): Secondary | ICD-10-CM

## 2013-09-10 DIAGNOSIS — I2510 Atherosclerotic heart disease of native coronary artery without angina pectoris: Secondary | ICD-10-CM

## 2013-09-10 DIAGNOSIS — Z171 Estrogen receptor negative status [ER-]: Secondary | ICD-10-CM | POA: Diagnosis not present

## 2013-09-10 DIAGNOSIS — E11319 Type 2 diabetes mellitus with unspecified diabetic retinopathy without macular edema: Secondary | ICD-10-CM | POA: Diagnosis not present

## 2013-09-10 DIAGNOSIS — C50519 Malignant neoplasm of lower-outer quadrant of unspecified female breast: Secondary | ICD-10-CM | POA: Diagnosis not present

## 2013-09-10 DIAGNOSIS — E1139 Type 2 diabetes mellitus with other diabetic ophthalmic complication: Secondary | ICD-10-CM | POA: Diagnosis not present

## 2013-09-10 DIAGNOSIS — Z794 Long term (current) use of insulin: Secondary | ICD-10-CM | POA: Diagnosis not present

## 2013-09-10 NOTE — Patient Instructions (Addendum)
Restart BIPAP . , try to wear for at least 4 hours each night  No Driving if sleepy.  We will send order for new mask and filter.  Please make an office visit with your primary doctor regarding your frequent headaches.  Follow up Dr. Elsworth Soho  In 2 months and As needed

## 2013-09-10 NOTE — Addendum Note (Signed)
Addended by: Parke Poisson E on: 09/10/2013 02:59 PM   Modules accepted: Orders

## 2013-09-10 NOTE — Assessment & Plan Note (Addendum)
Severe OSA on BIPAP  Mask leaks - DME order for mask fitting.  Doubt headaches are related to mask and head straps-suggest ongoing HA needs evaluation . She agreed to make ov with PCP   Plan  Restart BIPAP . , try to wear for at least 4 hours each night  No Driving if sleepy.  We will send order for new mask and filter.  Please make an office visit with your primary doctor regarding your frequent headaches.  Follow up Dr. Elsworth Soho  In 2 months and As needed

## 2013-09-10 NOTE — Progress Notes (Signed)
   Subjective:    Patient ID: Monica Lee, female    DOB: 1950/10/21, 63 y.o.   MRN: [id number]  HPI 63 y/o woman for followup of severe OSA Past medical history of DM, GERD, s/p renal transplant on immunosurpressives, asthma, breast cancer s/p chemoRx, blindness in left eye, chronic diastolic CHF admitted In Feb 2015 for an abscess in her left thigh after a trauma 4 wks ago. - s/p I& D Of the left thigh abscess on 02/18/13 for necrotizing soft tissue infection,now requiring wound VAC.  She was diagnosed with breast cancer in 11/2012 and started chemotherapy with the Port-A-Cath being placed in December 2014  She takes albuterol for occasional dyspnea and is maintained on Imuran and prednisone for immunosuppression  07/12/13  PSG - [facility] 67/h, Due to intolerance of high pressures with CPAP BiPAP initiated at 17/13 centimeters with a small full face mask  C/o headache - ? Coffee withdrawal Completed chemo for breast CA Download 06/2013 - on bipap - good usage except last couple of days (due to headache) - no residuals, leak ok  09/10/2013 Follow up severe OSA on BIPAP  Returns for 2 month follow up for OSA.  Reports not wearing her BiPAP regularly due to HA and mask discomfort and leaks. Daytime sleepiness is worse after XRT , she lies down and feels better.  Currently undergoing XRT feels that headache is due to chemo and XRT  Download w/ decreased usage, ~40% 12/30 days.  Says her new mask is leaking. She did not use for a while because thought the mask was causing headache. Still has headache despite not wearing mask.  Has 2 days left of XRT and says she is going to start wearing regularly.  Says headache worse at night and early  am. No visual/speech changes.       Review of Systems  neg for any significant sore throat, dysphagia, itching, sneezing, nasal congestion or excess/ purulent secretions, fever, chills, sweats, unintended wt loss, pleuritic or exertional cp, hempoptysis,  orthopnea pnd or change in chronic leg swelling. Also denies presyncope, palpitations, heartburn, abdominal pain, nausea, vomiting, diarrhea or change in bowel or urinary habits, dysuria,hematuria, rash, arthralgias, visual complaints, headache, numbness weakness or ataxia.     Objective:   Physical Exam   Gen. Pleasant, obese, in no distress ENT - no lesions, no post nasal drip Neck: No JVD, no thyromegaly, no carotid bruits Lungs: no use of accessory muscles, no dullness to percussion, decreased without rales or rhonchi  Cardiovascular: Rhythm regular, heart sounds  normal, no murmurs or gallops, no peripheral edema Musculoskeletal: No deformities, no cyanosis or clubbing , no tremors Neuro : no focal deficits noted.        Assessment & Plan:

## 2013-09-13 DIAGNOSIS — E1139 Type 2 diabetes mellitus with other diabetic ophthalmic complication: Secondary | ICD-10-CM | POA: Diagnosis not present

## 2013-09-13 DIAGNOSIS — Z171 Estrogen receptor negative status [ER-]: Secondary | ICD-10-CM | POA: Diagnosis not present

## 2013-09-13 DIAGNOSIS — Z794 Long term (current) use of insulin: Secondary | ICD-10-CM | POA: Diagnosis not present

## 2013-09-13 DIAGNOSIS — C50519 Malignant neoplasm of lower-outer quadrant of unspecified female breast: Secondary | ICD-10-CM | POA: Diagnosis not present

## 2013-09-13 DIAGNOSIS — E11319 Type 2 diabetes mellitus with unspecified diabetic retinopathy without macular edema: Secondary | ICD-10-CM | POA: Diagnosis not present

## 2013-09-13 DIAGNOSIS — Z510 Encounter for antineoplastic radiation therapy: Secondary | ICD-10-CM | POA: Diagnosis not present

## 2013-09-13 NOTE — Progress Notes (Signed)
Reviewed & agree with plan  

## 2013-09-14 DIAGNOSIS — Z794 Long term (current) use of insulin: Secondary | ICD-10-CM | POA: Diagnosis not present

## 2013-09-14 DIAGNOSIS — I509 Heart failure, unspecified: Secondary | ICD-10-CM | POA: Diagnosis not present

## 2013-09-14 DIAGNOSIS — E11319 Type 2 diabetes mellitus with unspecified diabetic retinopathy without macular edema: Secondary | ICD-10-CM | POA: Diagnosis not present

## 2013-09-14 DIAGNOSIS — Z510 Encounter for antineoplastic radiation therapy: Secondary | ICD-10-CM | POA: Diagnosis not present

## 2013-09-14 DIAGNOSIS — C50119 Malignant neoplasm of central portion of unspecified female breast: Secondary | ICD-10-CM | POA: Diagnosis not present

## 2013-09-14 DIAGNOSIS — I5032 Chronic diastolic (congestive) heart failure: Secondary | ICD-10-CM | POA: Diagnosis not present

## 2013-09-14 DIAGNOSIS — I10 Essential (primary) hypertension: Secondary | ICD-10-CM | POA: Diagnosis not present

## 2013-09-14 DIAGNOSIS — Z171 Estrogen receptor negative status [ER-]: Secondary | ICD-10-CM | POA: Diagnosis not present

## 2013-09-14 DIAGNOSIS — E1139 Type 2 diabetes mellitus with other diabetic ophthalmic complication: Secondary | ICD-10-CM | POA: Diagnosis not present

## 2013-09-29 ENCOUNTER — Encounter: Payer: Self-pay | Admitting: Internal Medicine

## 2013-09-29 ENCOUNTER — Ambulatory Visit (INDEPENDENT_AMBULATORY_CARE_PROVIDER_SITE_OTHER): Payer: Medicare Other

## 2013-09-29 ENCOUNTER — Ambulatory Visit (INDEPENDENT_AMBULATORY_CARE_PROVIDER_SITE_OTHER): Payer: Medicare Other | Admitting: Internal Medicine

## 2013-09-29 VITALS — BP 114/68 | HR 77 | Temp 98.1°F | Ht 65.0 in | Wt 187.8 lb

## 2013-09-29 DIAGNOSIS — Z23 Encounter for immunization: Secondary | ICD-10-CM

## 2013-09-29 DIAGNOSIS — E1159 Type 2 diabetes mellitus with other circulatory complications: Secondary | ICD-10-CM

## 2013-09-29 DIAGNOSIS — I2510 Atherosclerotic heart disease of native coronary artery without angina pectoris: Secondary | ICD-10-CM

## 2013-09-29 DIAGNOSIS — E1151 Type 2 diabetes mellitus with diabetic peripheral angiopathy without gangrene: Secondary | ICD-10-CM

## 2013-09-29 DIAGNOSIS — I798 Other disorders of arteries, arterioles and capillaries in diseases classified elsewhere: Secondary | ICD-10-CM

## 2013-09-29 DIAGNOSIS — R7989 Other specified abnormal findings of blood chemistry: Secondary | ICD-10-CM

## 2013-09-29 DIAGNOSIS — I10 Essential (primary) hypertension: Secondary | ICD-10-CM

## 2013-09-29 DIAGNOSIS — E785 Hyperlipidemia, unspecified: Secondary | ICD-10-CM | POA: Diagnosis not present

## 2013-09-29 DIAGNOSIS — E038 Other specified hypothyroidism: Secondary | ICD-10-CM

## 2013-09-29 LAB — HEMOGLOBIN A1C: Hgb A1c MFr Bld: 9.9 % — ABNORMAL HIGH (ref 4.6–6.5)

## 2013-09-29 MED ORDER — POLYETHYLENE GLYCOL 3350 17 G PO PACK: 17.0000 g | PACK | Freq: Every day | ORAL | Status: DC

## 2013-09-29 NOTE — Assessment & Plan Note (Signed)
stable overall by history and exam, recent data reviewed with pt, and pt to continue medical treatment as before,  to f/u any worsening symptoms or concerns Lab Results  Component Value Date   LDLCALC 68 09/04/2010

## 2013-09-29 NOTE — Progress Notes (Signed)
Pre visit review using our clinic review tool, if applicable. No additional management support is needed unless otherwise documented below in the visit note. 

## 2013-09-29 NOTE — Patient Instructions (Addendum)
You had the flu shot today, and the Pneumovax pneumonia shot  Please continue all other medications as before, and refills have been done if requested.  Please have the pharmacy call with any other refills you may need.  Please continue your efforts at being more active, low cholesterol diet, and weight control.  You are otherwise up to date with prevention measures today.  Please keep your appointments with your specialists as you may have planned  Please go to the LAB in the Basement (turn left off the elevator) for the tests to be done today  You will be contacted by phone if any changes need to be made immediately.  Otherwise, you will receive a letter about your results with an explanation, but please check with MyChart first.  Please remember to sign up for MyChart if you have not done so, as this will be important to you in the future with finding out test results, communicating by private email, and scheduling acute appointments online when needed.  Please return in 6 months, or sooner if needed

## 2013-09-29 NOTE — Assessment & Plan Note (Signed)
Lab Results  Component Value Date   TSH 15.44* 03/24/2013   For f/u lab today as well

## 2013-09-29 NOTE — Addendum Note (Signed)
Addended by: Sharon Seller B on: 09/29/2013 02:25 PM   Modules accepted: Orders

## 2013-09-29 NOTE — Assessment & Plan Note (Signed)
Pt reqeusts labs today, clinically stable, for labs and f/u endo as planned

## 2013-09-29 NOTE — Progress Notes (Signed)
Subjective:    Patient ID: Monica Lee, female    DOB: 09-25-1950, 63 y.o.   MRN: [id number]  HPI  Here to f/u; had fall in jan with injury left thigh requiring surg x 2 and skin graft,  overall doing ok today,  Pt denies chest pain, increased sob or doe, wheezing, orthopnea, PND, increased LE swelling, palpitations, dizziness or syncope.  Pt denies polydipsia, polyuria, or low sugar symptoms such as weakness or confusion improved with po intake.  Pt denies new neurological symptoms such as new headache, or facial or extremity weakness or numbness.   Pt states overall good compliance with meds, has been trying to follow lower cholesterol, diabetic diet, with wt overall stable,  Did have high sugar aug 25 over 400 b/c ate without insulin that am.  Having trouble affording her insulin, when has to take with all other med incluing the expensive sensipar.  Sees [facility]/Dr deterding, also Dr Renne Crigler for DM, Dr Johnsie Cancel for card, Dr Magrinat/oncology, and Dr Elsworth Soho - pulm - sleep apnea.  Due for flu and pneumovax.  Pt asks for labs today, has f/u for DM in October. Denies hyper or hypo thyroid symptoms such as voice, skin or hair change. Past Medical History  Diagnosis Date  . S/P kidney transplant     CADAVERIC --  05/2006  . Hyperlipidemia   . DJD (degenerative joint disease)   . Hx of colonic polyps   . Diabetic retinopathy     RIGHT EYE  . Hypertension   . Asthma   . Blind left eye     OCCLUDED CORNEA,  FUNDI--  FAILED SURGERY REPAIR 2003  . Chronic diastolic CHF (congestive heart failure)     a. Cardiac MRI (10/2012): Moderate to severe LVH, EF 55%, chordal SAM but no LVOT gradient, mod circumferential effusion, mild LAE.  b.  Echocardiogram (11/06/12): Severe LVH, EF 27-74%, grade 1 diastolic dysfunction, moderate effusion.  . Wears glasses   . History of myocardial infarction     2011--  S/P CARDIAC CATH ([place])  NON-OBSTRUCTIVE CAD  . GERD (gastroesophageal reflux disease)   .  History of kidney stones   . History of CVA (cerebrovascular accident) without residual deficits     2008  AND TIA IN 2008  W/ NO RESIDUAL  . History of peptic ulcer     2011--  RESOLVED  . 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 Gastroparesis due to DM   . Chronic kidney disease (CKD), stage III (moderate)     NEPHROLOGIST--  DR Freddrick March  . Open thigh wound     ANTERIOR  . Coronary artery disease CARDIOLOGIST--  DR Johnsie Cancel    NON-OBSTRUCTIVE CAD  PER CARDIAC CATH  2011  . Pericardial effusion without cardiac tamponade     PER DR NISHAN  NOTE --  NOT MALIGNANT  ? RELATED TO HYPOTHYROIDISM  . Thyromegaly     MULTINODULER GOITER  . Hyperparathyroidism, secondary renal   . Anemia in chronic kidney disease   . Trigeminal neuralgia   . Hypothyroidism   . At risk for sleep apnea     STOP-BANG= 4   SENT TO PCP  03-25-2013   Past Surgical History  Procedure Laterality Date  . Transplantation renal  05/2006  CADAVERIC  . Breast biopsy  2010  . Cholecystectomy  2008  . Hemorrhoid surgery  01/23/2012    Procedure: HEMORRHOIDECTOMY PROLAPSED;  Surgeon: Leighton Ruff, MD;  Location: [facility];  Service: General;  Laterality: N/A;  . Foot surgery Right 2013  . Breast lumpectomy with sentinel lymph node biopsy Right 11/09/2012    Procedure: RIGHT BREAST LUMPECTOMY WITH SENTINEL LYMPH NODE REMOVAL;  Surgeon: Haywood Lasso, MD;  Location: [place];  Service: General;  Laterality: Right;  . Portacath placement Right 11/30/2012    Procedure: INSERTION PORT-A-CATH;  Surgeon: Haywood Lasso, MD;  Location: [place];  Service: General;  Laterality: Right;  . Irrigation and debridement abscess Left 02/14/2013    Procedure: IRRIGATION AND DEBRIDEMENT LEFT THIGH ABSCESS;  Surgeon: Joyice Faster. Cornett, MD;  Location: [place];  Service: General;  Laterality: Left;  . Incision and drainage of wound Left 02/18/2013    Procedure: IRRIGATION AND DEBRIDEMENT THIGH WOUND;  Surgeon: Rolm Bookbinder, MD;  Location: [facility];  Service: General;  Laterality: Left;  . Cataract extraction w/ intraocular lens  implant, bilateral    . Repair  intestinal perforation surgery  01-31-2009  . Cardiac catheterization  04-12-2009  DR EVELYNE GOUDREAU ([facility] IN [place], [place])    NON-OBSTRUCTIVE CAD/  mLAD 40%,  oLAD 50%,  dCFX 40%,  OM1  40%,  mRCA  &  dRCA  50%/  LVEF 65% /  ELEVATED  LVEDP  . Transthoracic echocardiogram  01-19-2013  DR NISHAN    SEVERE LVH/  EF 16-10%/  GRADE I DIASTOLIC DYSFUNCTION/  MODERATE LAE/  MILD IMPROVEMENT OF MODERATE CIRCUMFERENTIAL INFERIOROLATERAL PERICARDIAL EFFUSION WITH NO TAMPONADE  . Cardiovascular stress test  11-04-2012  DR NISHAN    HIGH RISK NUCLEAR STUDY/  FIXED DEFECT AFFECTING ENTIRE INFEROLATERAL AND ANTEROLATERAL WALL/  MODERATE ISCHEMIA  AT MID/ APICAL ANTEROR AND INFERIOR WALL/  GLOBAL HYPOKINESIS/  LVEF 28% (FELT TO BE FALSE +,  ECHO & cMRI  NORMAL EF AND NORMAL WM)  . Vaginal hysterectomy  1990's    W/  BILATERAL SALPINGOOPHORECTOMY  . Eye surgery Left 2003    REPAIR CORNEA  . Skin split graft Left 03/29/2013    Procedure: SKIN GRAFT SPLIT THICKNESS WITH A-CELL AND VAC TO LEFT THIGH;  Surgeon: Theodoro Kos, DO;  Location: [place];  Service: Plastics;  Laterality: Left;    reports that she has never smoked. She has never used smokeless tobacco. She reports that she does not drink alcohol or use illicit drugs. family history includes Arthritis in her other; Diabetes in her other and other; Heart disease in her father and other; Hypertension in her other; Thyroid disease in her other. There is no history of Kidney disease. Allergies  Allergen Reactions  . Keflex [Cephalexin] Swelling    Swelling to face, chills Tolerates zosyn 02/14/13  . Propoxyphene N-Acetaminophen  Hives and Itching   Current Outpatient Prescriptions on File Prior to Visit  Medication Sig Dispense Refill  . albuterol (ACCUNEB) 0.63 MG/3ML nebulizer solution Take 1 ampule by nebulization every 6 (six) hours as needed for wheezing.      Marland Kitchen albuterol (PROVENTIL HFA;VENTOLIN HFA) 108 (90 BASE) MCG/ACT inhaler Inhale 2 puffs into the lungs every 6 (six) hours as needed for wheezing or shortness of breath.      . allopurinol (ZYLOPRIM) 100 MG tablet Take 1 tablet by mouth daily.      Marland Kitchen amLODipine (NORVASC) 10 MG tablet Take 10  mg by mouth every morning.      Marland Kitchen aspirin EC 325 MG tablet Take 325 mg by mouth daily.        Marland Kitchen azaTHIOprine (IMURAN) 50 MG tablet Take 50 mg by mouth 2 (two) times daily.        . brimonidine (ALPHAGAN) 0.2 % ophthalmic solution Place 1 drop into the right eye 2 (two) times daily.       . cinacalcet (SENSIPAR) 30 MG tablet Take 30 mg by mouth every morning.      . docusate sodium (COLACE) 100 MG capsule Take 1 capsule (100 mg total) by mouth 2 (two) times daily.  60 capsule  0  . feeding supplement, GLUCERNA SHAKE, (GLUCERNA SHAKE) LIQD Take 237 mLs by mouth 2 (two) times daily between meals.    0  . furosemide (LASIX) 80 MG tablet Take 160 mg by mouth 2 (two) times daily.       . insulin aspart (NOVOLOG) 100 UNIT/ML injection Inject 13 units daily at breakfast, 13 units daily at lunch, and 17 units daily at supper.      . insulin glargine (LANTUS) 100 UNIT/ML injection Inject 0.45 mLs (45 Units total) into the skin at bedtime.  20 mL  11  . isosorbide mononitrate (IMDUR) 60 MG 24 hr tablet Take 60 mg by mouth every morning.      Marland Kitchen ketoconazole (NIZORAL) 2 % cream Apply 1 application topically daily.  15 g  0  . KLOR-CON M20 20 MEQ tablet Takes 1/2 tab in morning and 1/2 at night.      . levothyroxine (SYNTHROID, LEVOTHROID) 75 MCG tablet Take 1 tablet (75 mcg total) by mouth daily.  90 tablet  3  . Magnesium Oxide 250 MG TABS Take 250 mg by mouth daily.       .  metoprolol tartrate (LOPRESSOR) 25 MG tablet Take 1 tablet (25 mg total) by mouth 2 (two) times daily.  60 tablet  1  . minoxidil (LONITEN) 2.5 MG tablet Take 2.5 mg by mouth 2 (two) times daily.      . nitroGLYCERIN (NITROSTAT) 0.4 MG SL tablet Place 0.4 mg under the tongue every 5 (five) minutes as needed for chest pain.      . pravastatin (PRAVACHOL) 20 MG tablet Take 10 mg by mouth at bedtime.        . prednisoLONE acetate (PRED FORTE) 1 % ophthalmic suspension Place 1 drop into the left eye 2 (two) times daily.       . predniSONE (DELTASONE) 10 MG tablet Take 10 mg by mouth every morning.       . tacrolimus (PROGRAF) 1 MG capsule Take 9 mg by mouth 2 (two) times daily.       . timolol (TIMOPTIC) 0.25 % ophthalmic solution Place 1 drop into the right eye 2 (two) times daily.       No current facility-administered medications on file prior to visit.   Review of Systems  Constitutional: Negative for unusual diaphoresis or other sweats  HENT: Negative for ringing in ear Eyes: Negative for double vision or worsening visual disturbance.  Respiratory: Negative for choking and stridor.   Gastrointestinal: Negative for vomiting or other signifcant bowel change Genitourinary: Negative for hematuria or decreased urine volume.  Musculoskeletal: Negative for other MSK pain or swelling Skin: Negative for color change and worsening wound.  Neurological: Negative for tremors and numbness other than noted  Psychiatric/Behavioral: Negative for decreased concentration or agitation other than above  Objective:   Physical Exam BP 114/68  Pulse 77  Temp(Src) 98.1 F (36.7 C) (Oral)  Ht 5\' 5"  (1.651 m)  Wt 187 lb 12 oz (85.163 kg)  BMI 31.24 kg/m2  SpO2 95% VS noted,  Constitutional: Pt appears well-developed, well-nourished.  HENT: Head: NCAT.  Right Ear: External ear normal.  Left Ear: External ear normal.  Eyes: . Pupils are equal, round, and reactive to light. Conjunctivae and EOM are  normal Neck: Normal range of motion. Neck supple.  Cardiovascular: Normal rate and regular rhythm.   Pulmonary/Chest: Effort normal and breath sounds normal.  Abd:  Soft, NT, ND, + BS Neurological: Pt is alert. Not confused , motor grossly intact Skin: Skin is warm. No rash Psychiatric: Pt behavior is normal. No agitation.     Assessment & Plan:

## 2013-09-29 NOTE — Assessment & Plan Note (Signed)
stable overall by history and exam, recent data reviewed with pt, and pt to continue medical treatment as before,  to f/u any worsening symptoms or concerns BP Readings from Last 3 Encounters:  09/29/13 114/68  09/10/13 128/74  09/06/13 134/58

## 2013-09-30 ENCOUNTER — Telehealth: Payer: Self-pay | Admitting: *Deleted

## 2013-09-30 LAB — CBC WITH DIFFERENTIAL/PLATELET
BASOS ABS: 0 10*3/uL (ref 0.0–0.1)
Basophils Relative: 0 % (ref 0.0–3.0)
EOS PCT: 0.5 % (ref 0.0–5.0)
Eosinophils Absolute: 0 10*3/uL (ref 0.0–0.7)
HEMATOCRIT: 38.1 % (ref 36.0–46.0)
Hemoglobin: 12.7 g/dL (ref 12.0–15.0)
LYMPHS ABS: 0.5 10*3/uL — AB (ref 0.7–4.0)
LYMPHS PCT: 8 % — AB (ref 12.0–46.0)
MCHC: 33.3 g/dL (ref 30.0–36.0)
MCV: 89.2 fl (ref 78.0–100.0)
MONOS PCT: 6.5 % (ref 3.0–12.0)
Monocytes Absolute: 0.4 10*3/uL (ref 0.1–1.0)
NEUTROS PCT: 85 % — AB (ref 43.0–77.0)
Neutro Abs: 5.1 10*3/uL (ref 1.4–7.7)
PLATELETS: 209 10*3/uL (ref 150.0–400.0)
RBC: 4.27 Mil/uL (ref 3.87–5.11)
RDW: 15.8 % — ABNORMAL HIGH (ref 11.5–15.5)
WBC: 6 10*3/uL (ref 4.0–10.5)

## 2013-09-30 LAB — HEPATIC FUNCTION PANEL
ALK PHOS: 45 U/L (ref 39–117)
ALT: 11 U/L (ref 0–35)
AST: 17 U/L (ref 0–37)
Albumin: 4.1 g/dL (ref 3.5–5.2)
BILIRUBIN DIRECT: 0.1 mg/dL (ref 0.0–0.3)
BILIRUBIN TOTAL: 0.6 mg/dL (ref 0.2–1.2)
Total Protein: 7.5 g/dL (ref 6.0–8.3)

## 2013-09-30 LAB — LIPID PANEL
CHOL/HDL RATIO: 4
Cholesterol: 134 mg/dL (ref 0–200)
HDL: 35.8 mg/dL — ABNORMAL LOW (ref >39.00)
NONHDL: 98.2
TRIGLYCERIDES: 264 mg/dL — AB (ref 0.0–149.0)
VLDL: 52.8 mg/dL — ABNORMAL HIGH (ref 0.0–40.0)

## 2013-09-30 LAB — BASIC METABOLIC PANEL
BUN: 22 mg/dL (ref 6–23)
CO2: 29 meq/L (ref 19–32)
CREATININE: 1 mg/dL (ref 0.4–1.2)
Calcium: 8.9 mg/dL (ref 8.4–10.5)
Chloride: 103 mEq/L (ref 96–112)
GFR: 71.28 mL/min (ref >60.00)
Glucose, Bld: 269 mg/dL — ABNORMAL HIGH (ref 70–99)
Potassium: 4.1 mEq/L (ref 3.5–5.1)
SODIUM: 138 meq/L (ref 135–145)

## 2013-09-30 LAB — LDL CHOLESTEROL, DIRECT: LDL DIRECT: 57.7 mg/dL

## 2013-09-30 LAB — TSH: TSH: 4 u[IU]/mL (ref 0.35–4.50)

## 2013-09-30 NOTE — Telephone Encounter (Signed)
Received copy of radiation oncology end of treatment summary from Dr. Meda Coffee in [place], [place] dated 09/14/13. Copy given to Dr. Jana Hakim for review and original sent to HIM to be scanned into patient's chart.

## 2013-10-05 DIAGNOSIS — Z872 Personal history of diseases of the skin and subcutaneous tissue: Secondary | ICD-10-CM | POA: Diagnosis not present

## 2013-10-05 DIAGNOSIS — IMO0002 Reserved for concepts with insufficient information to code with codable children: Secondary | ICD-10-CM | POA: Diagnosis not present

## 2013-10-05 DIAGNOSIS — Z713 Dietary counseling and surveillance: Secondary | ICD-10-CM | POA: Diagnosis not present

## 2013-10-05 DIAGNOSIS — Z940 Kidney transplant status: Secondary | ICD-10-CM | POA: Diagnosis not present

## 2013-10-06 DIAGNOSIS — Z713 Dietary counseling and surveillance: Secondary | ICD-10-CM | POA: Diagnosis not present

## 2013-10-06 DIAGNOSIS — IMO0002 Reserved for concepts with insufficient information to code with codable children: Secondary | ICD-10-CM | POA: Diagnosis not present

## 2013-10-08 ENCOUNTER — Encounter (HOSPITAL_COMMUNITY): Payer: Self-pay | Admitting: Emergency Medicine

## 2013-10-08 ENCOUNTER — Emergency Department (HOSPITAL_COMMUNITY): Payer: Medicare Other

## 2013-10-08 ENCOUNTER — Emergency Department (HOSPITAL_COMMUNITY)
Admission: EM | Admit: 2013-10-08 | Discharge: 2013-10-08 | Disposition: A | Discharge status: Home / Self Care | Payer: Medicare Other | Attending: Emergency Medicine | Admitting: Emergency Medicine

## 2013-10-08 DIAGNOSIS — Z940 Kidney transplant status: Secondary | ICD-10-CM | POA: Diagnosis not present

## 2013-10-08 DIAGNOSIS — N183 Chronic kidney disease, stage 3 unspecified: Secondary | ICD-10-CM | POA: Diagnosis not present

## 2013-10-08 DIAGNOSIS — Z8673 Personal history of transient ischemic attack (TIA), and cerebral infarction without residual deficits: Secondary | ICD-10-CM | POA: Insufficient documentation

## 2013-10-08 DIAGNOSIS — E785 Hyperlipidemia, unspecified: Secondary | ICD-10-CM | POA: Diagnosis not present

## 2013-10-08 DIAGNOSIS — IMO0002 Reserved for concepts with insufficient information to code with codable children: Secondary | ICD-10-CM | POA: Diagnosis not present

## 2013-10-08 DIAGNOSIS — I5032 Chronic diastolic (congestive) heart failure: Secondary | ICD-10-CM | POA: Insufficient documentation

## 2013-10-08 DIAGNOSIS — E039 Hypothyroidism, unspecified: Secondary | ICD-10-CM | POA: Insufficient documentation

## 2013-10-08 DIAGNOSIS — Z853 Personal history of malignant neoplasm of breast: Secondary | ICD-10-CM | POA: Insufficient documentation

## 2013-10-08 DIAGNOSIS — E11319 Type 2 diabetes mellitus with unspecified diabetic retinopathy without macular edema: Secondary | ICD-10-CM | POA: Diagnosis not present

## 2013-10-08 DIAGNOSIS — Z792 Long term (current) use of antibiotics: Secondary | ICD-10-CM | POA: Diagnosis not present

## 2013-10-08 DIAGNOSIS — N039 Chronic nephritic syndrome with unspecified morphologic changes: Secondary | ICD-10-CM | POA: Diagnosis not present

## 2013-10-08 DIAGNOSIS — I129 Hypertensive chronic kidney disease with stage 1 through stage 4 chronic kidney disease, or unspecified chronic kidney disease: Secondary | ICD-10-CM | POA: Insufficient documentation

## 2013-10-08 DIAGNOSIS — E1139 Type 2 diabetes mellitus with other diabetic ophthalmic complication: Secondary | ICD-10-CM | POA: Insufficient documentation

## 2013-10-08 DIAGNOSIS — M79609 Pain in unspecified limb: Secondary | ICD-10-CM | POA: Insufficient documentation

## 2013-10-08 DIAGNOSIS — I2510 Atherosclerotic heart disease of native coronary artery without angina pectoris: Secondary | ICD-10-CM | POA: Diagnosis not present

## 2013-10-08 DIAGNOSIS — E1149 Type 2 diabetes mellitus with other diabetic neurological complication: Secondary | ICD-10-CM | POA: Diagnosis not present

## 2013-10-08 DIAGNOSIS — Z79899 Other long term (current) drug therapy: Secondary | ICD-10-CM | POA: Insufficient documentation

## 2013-10-08 DIAGNOSIS — Z87442 Personal history of urinary calculi: Secondary | ICD-10-CM | POA: Diagnosis not present

## 2013-10-08 DIAGNOSIS — D631 Anemia in chronic kidney disease: Secondary | ICD-10-CM | POA: Insufficient documentation

## 2013-10-08 DIAGNOSIS — M79631 Pain in right forearm: Secondary | ICD-10-CM

## 2013-10-08 DIAGNOSIS — Z7982 Long term (current) use of aspirin: Secondary | ICD-10-CM | POA: Diagnosis not present

## 2013-10-08 DIAGNOSIS — R609 Edema, unspecified: Secondary | ICD-10-CM | POA: Diagnosis not present

## 2013-10-08 DIAGNOSIS — J45909 Unspecified asthma, uncomplicated: Secondary | ICD-10-CM | POA: Insufficient documentation

## 2013-10-08 DIAGNOSIS — K3184 Gastroparesis: Secondary | ICD-10-CM | POA: Diagnosis not present

## 2013-10-08 DIAGNOSIS — Z794 Long term (current) use of insulin: Secondary | ICD-10-CM | POA: Insufficient documentation

## 2013-10-08 DIAGNOSIS — M7989 Other specified soft tissue disorders: Secondary | ICD-10-CM | POA: Diagnosis not present

## 2013-10-08 DIAGNOSIS — I10 Essential (primary) hypertension: Secondary | ICD-10-CM | POA: Diagnosis not present

## 2013-10-08 LAB — CBC
HCT: 37 % (ref 36.0–46.0)
HEMOGLOBIN: 11.7 g/dL — AB (ref 12.0–15.0)
MCH: 29.1 pg (ref 26.0–34.0)
MCHC: 31.6 g/dL (ref 30.0–36.0)
MCV: 92 fL (ref 78.0–100.0)
PLATELETS: 188 10*3/uL (ref 150–400)
RBC: 4.02 MIL/uL (ref 3.87–5.11)
RDW: 14.7 % (ref 11.5–15.5)
WBC: 4.8 10*3/uL (ref 4.0–10.5)

## 2013-10-08 LAB — I-STAT CG4 LACTIC ACID, ED: Lactic Acid, Venous: 0.61 mmol/L (ref 0.5–2.2)

## 2013-10-08 MED ORDER — DOXYCYCLINE HYCLATE 100 MG PO CAPS: 100.0000 mg | ORAL_CAPSULE | Freq: Two times a day (BID) | ORAL | Status: DC

## 2013-10-08 NOTE — Discharge Instructions (Signed)
Your blood work and xray are normal !   Please call your doctor for a followup appointment within 24-48 hours. When you talk to your doctor please let them know that you were seen in the emergency department and have them acquire all of your records so that they can discuss the findings with you and formulate a treatment plan to fully care for your new and ongoing problems.

## 2013-10-08 NOTE — ED Notes (Signed)
Vital signs stable. 

## 2013-10-08 NOTE — ED Provider Notes (Signed)
CSN: [id number]     Arrival date & time 10/08/13  1258 History   First MD Initiated Contact with Patient 10/08/13 1350     Chief Complaint  Patient presents with  . Arm Swelling     (Consider location/radiation/quality/duration/timing/severity/associated sxs/prior Treatment) HPI Comments: 63 year old female, history of kidney transplant, history of recent necrotizing fasciitis infection of her left thigh [place], presents with one week of right forearm swelling. This started one week ago, there was some redness, swelling and finally had some posturing H. from the right volar proximal forearm. This has since had some intermittent mild swelling which comes and goes, no fevers chills nausea or vomiting, pain is gradually improving and almost gone. She denies any other systemic symptoms, has no other extremity complaints and states that her skin grafting from the surgery has gone very well and is gradually improving.   The history is provided by the patient.    Past Medical History  Diagnosis Date  . S/P kidney transplant     CADAVERIC --  05/2006  . Hyperlipidemia   . DJD (degenerative joint disease)   . Hx of colonic polyps   . Diabetic retinopathy     RIGHT EYE  . Hypertension   . Asthma   . Blind left eye     OCCLUDED CORNEA,  FUNDI--  FAILED SURGERY REPAIR 2003  . Chronic diastolic CHF (congestive heart failure)     a. Cardiac MRI (10/2012): Moderate to severe LVH, EF 55%, chordal SAM but no LVOT gradient, mod circumferential effusion, mild LAE.  b.  Echocardiogram (11/06/12): Severe LVH, EF 87-56%, grade 1 diastolic dysfunction, moderate effusion.  . Wears glasses   . History of myocardial infarction     2011--  S/P CARDIAC CATH ([place])  NON-OBSTRUCTIVE CAD  . GERD (gastroesophageal reflux disease)   . History of kidney stones   . History of CVA (cerebrovascular accident) without residual deficits     2008  AND TIA IN 2008  W/ NO RESIDUAL  .  History of peptic ulcer     2011--  RESOLVED  . 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 Gastroparesis due to DM   . Chronic kidney disease (CKD), stage III (moderate)     NEPHROLOGIST--  DR Freddrick March  . Open thigh wound     ANTERIOR  . Coronary artery disease CARDIOLOGIST--  DR Johnsie Cancel    NON-OBSTRUCTIVE CAD  PER CARDIAC CATH  2011  . Pericardial effusion without cardiac tamponade     PER DR NISHAN  NOTE --  NOT MALIGNANT  ? RELATED TO HYPOTHYROIDISM  . Thyromegaly     MULTINODULER GOITER  . Hyperparathyroidism, secondary renal   . Anemia in chronic kidney disease   . Trigeminal neuralgia   . Hypothyroidism   . At risk for sleep apnea     STOP-BANG= 4   SENT TO PCP  03-25-2013   Past Surgical History  Procedure Laterality Date  . Transplantation renal  05/2006    CADAVERIC  . Breast biopsy  2010  . Cholecystectomy  2008  . Hemorrhoid surgery  01/23/2012    Procedure: HEMORRHOIDECTOMY PROLAPSED;  Surgeon: Leighton Ruff, MD;  Location: [facility];  Service: General;  Laterality: N/A;  . Foot surgery  Right 2013  . Breast lumpectomy with sentinel lymph node biopsy Right 11/09/2012    Procedure: RIGHT BREAST LUMPECTOMY WITH SENTINEL LYMPH NODE REMOVAL;  Surgeon: Haywood Lasso, MD;  Location: [place];  Service: General;  Laterality: Right;  . Portacath placement Right 11/30/2012    Procedure: INSERTION PORT-A-CATH;  Surgeon: Haywood Lasso, MD;  Location: [place];  Service: General;  Laterality: Right;  . Irrigation and debridement abscess Left 02/14/2013    Procedure: IRRIGATION AND DEBRIDEMENT LEFT THIGH ABSCESS;  Surgeon: Joyice Faster. Cornett, MD;  Location: [place];  Service: General;  Laterality: Left;  . Incision and drainage of wound Left 02/18/2013    Procedure: IRRIGATION AND DEBRIDEMENT THIGH WOUND;   Surgeon: Rolm Bookbinder, MD;  Location: [place];  Service: General;  Laterality: Left;  . Cataract extraction w/ intraocular lens  implant, bilateral    . Repair  intestinal perforation surgery  01-31-2009  . Cardiac catheterization  04-12-2009  DR EVELYNE GOUDREAU ([facility] IN [place], [place])    NON-OBSTRUCTIVE CAD/  mLAD 40%,  oLAD 50%,  dCFX 40%,  OM1  40%,  mRCA  &  dRCA  50%/  LVEF 65% /  ELEVATED  LVEDP  . Transthoracic echocardiogram  01-19-2013  DR NISHAN    SEVERE LVH/  EF 73-71%/  GRADE I DIASTOLIC DYSFUNCTION/  MODERATE LAE/  MILD IMPROVEMENT OF MODERATE CIRCUMFERENTIAL INFERIOROLATERAL PERICARDIAL EFFUSION WITH NO TAMPONADE  . Cardiovascular stress test  11-04-2012  DR NISHAN    HIGH RISK NUCLEAR STUDY/  FIXED DEFECT AFFECTING ENTIRE INFEROLATERAL AND ANTEROLATERAL WALL/  MODERATE ISCHEMIA  AT MID/ APICAL ANTEROR AND INFERIOR WALL/  GLOBAL HYPOKINESIS/  LVEF 28% (FELT TO BE FALSE +,  ECHO & cMRI  NORMAL EF AND NORMAL WM)  . Vaginal hysterectomy  1990's    W/  BILATERAL SALPINGOOPHORECTOMY  . Eye surgery Left 2003    REPAIR CORNEA  . Skin split graft Left 03/29/2013    Procedure: SKIN GRAFT SPLIT THICKNESS WITH A-CELL AND VAC TO LEFT THIGH;  Surgeon: Theodoro Kos, DO;  Location: [place];  Service: Plastics;  Laterality: Left;   Family History  Problem Relation Age of Onset  . Kidney disease Neg Hx   . Arthritis Other     parent  . Heart disease Other     parent  . Hypertension Other     parent  . Diabetes Other     parent  . Diabetes Other     grandparent  . Thyroid disease Other     several  . Heart disease Father    History  Substance Use Topics  . Smoking status: Never Smoker   . Smokeless tobacco: Never Used  . Alcohol Use: No   OB History   Grav Para Term Preterm Abortions TAB SAB Ect Mult Living                 Review of Systems  All other systems reviewed and are negative.     Allergies  Keflex and Propoxyphene  n-acetaminophen  Home Medications   Prior to Admission medications   Medication Sig Start Date End Date Taking? Authorizing Provider  albuterol (ACCUNEB) 0.63 MG/3ML nebulizer solution Take 1 ampule by nebulization every 6 (six) hours as needed for wheezing.   Yes Historical Provider, MD  albuterol (PROVENTIL HFA;VENTOLIN HFA) 108 (90 BASE) MCG/ACT inhaler Inhale 2 puffs into the lungs every 6 (six) hours as needed for wheezing or shortness of breath.  Yes Historical Provider, MD  amLODipine (NORVASC) 10 MG tablet Take 10 mg by mouth every morning. 09/28/12  Yes Biagio Borg, MD  aspirin EC 81 MG tablet Take 81 mg by mouth daily.   Yes Historical Provider, MD  azaTHIOprine (IMURAN) 50 MG tablet Take 75 mg by mouth daily.    Yes Historical Provider, MD  brimonidine (ALPHAGAN) 0.2 % ophthalmic solution Place 1 drop into the right eye 2 (two) times daily.  03/10/13  Yes Historical Provider, MD  cinacalcet (SENSIPAR) 30 MG tablet Take 30 mg by mouth every morning. 04/09/12  Yes Biagio Borg, MD  Docusate Sodium POWD 1 Package by Does not apply route daily. Puts in coffee   Yes Historical Provider, MD  doxycycline (VIBRAMYCIN) 100 MG capsule Take 1 capsule by mouth daily. 10/06/13  Yes Historical Provider, MD  feeding supplement, GLUCERNA SHAKE, (GLUCERNA SHAKE) LIQD Take 237 mLs by mouth 2 (two) times daily between meals. 02/23/13  Yes Hosie Poisson, MD  furosemide (LASIX) 80 MG tablet Take 160 mg by mouth 2 (two) times daily.    Yes Historical Provider, MD  insulin aspart (NOVOLOG) 100 UNIT/ML injection Inject 13 units daily at breakfast, 13 units daily at lunch, and 17 units daily at supper. 06/22/13  Yes Biagio Borg, MD  insulin glargine (LANTUS) 100 UNIT/ML injection Inject 0.45 mLs (45 Units total) into the skin at bedtime. 06/22/13  Yes Biagio Borg, MD  isosorbide mononitrate (IMDUR) 60 MG 24 hr tablet Take 60 mg by mouth every morning. 12/28/12  Yes Biagio Borg, MD  KLOR-CON M20 20 MEQ tablet Takes  1/2 tab in morning and 1/2 at night. 02/11/13  Yes Historical Provider, MD  levothyroxine (SYNTHROID, LEVOTHROID) 75 MCG tablet Take 1 tablet (75 mcg total) by mouth daily. 03/25/13  Yes Biagio Borg, MD  magnesium oxide (MAG-OX) 400 MG tablet Take 400 mg by mouth 2 (two) times daily.   Yes Historical Provider, MD  metoprolol tartrate (LOPRESSOR) 25 MG tablet Take 1 tablet (25 mg total) by mouth 2 (two) times daily. 02/23/13  Yes Hosie Poisson, MD  minoxidil (LONITEN) 2.5 MG tablet Take 2.5 mg by mouth 2 (two) times daily.   Yes Historical Provider, MD  nitroGLYCERIN (NITROSTAT) 0.4 MG SL tablet Place 0.4 mg under the tongue every 5 (five) minutes as needed for chest pain.   Yes Renella Cunas, MD  pravastatin (PRAVACHOL) 20 MG tablet Take 10 mg by mouth at bedtime.     Yes Historical Provider, MD  prednisoLONE acetate (PRED FORTE) 1 % ophthalmic suspension Place 1 drop into the left eye 2 (two) times daily.    Yes Historical Provider, MD  predniSONE (DELTASONE) 10 MG tablet Take 10 mg by mouth every morning.    Yes Historical Provider, MD  tacrolimus (PROGRAF) 1 MG capsule Take 9 mg by mouth 2 (two) times daily.    Yes Historical Provider, MD  timolol (TIMOPTIC) 0.25 % ophthalmic solution Place 1 drop into the right eye 2 (two) times daily.   Yes Historical Provider, MD  doxycycline (VIBRAMYCIN) 100 MG capsule Take 1 capsule (100 mg total) by mouth 2 (two) times daily. 10/08/13   Johnna Acosta, MD   BP 120/44  Pulse 71  Temp(Src) 98 F (36.7 C) (Oral)  Resp 20  SpO2 95% Physical Exam  Nursing note and vitals reviewed. Constitutional: She appears well-developed and well-nourished. No distress.  HENT:  Head: Normocephalic and atraumatic.  Mouth/Throat: Oropharynx is clear  and moist. No oropharyngeal exudate.  Eyes: Conjunctivae and EOM are normal. Pupils are equal, round, and reactive to light. Right eye exhibits no discharge. Left eye exhibits no discharge. No scleral icterus.  Neck: Normal  range of motion. Neck supple. No JVD present. No thyromegaly present.  Cardiovascular: Normal rate, regular rhythm, normal heart sounds and intact distal pulses.  Exam reveals no gallop and no friction rub.   No murmur heard. Pulmonary/Chest: Effort normal and breath sounds normal. No respiratory distress. She has no wheezes. She has no rales.  Abdominal: Soft. Bowel sounds are normal. She exhibits no distension and no mass. There is no tenderness.  Musculoskeletal: Normal range of motion. She exhibits no edema.  Right forearm with mild swelling and asymmetry compared to left forearm, very soft compartment, very supple joints, no tenderness at all with palpation of the right forearm. The area of scab from prior drainage of historical abscess, no erythema, no subcutaneous emphysema or crepitance, full range of motion of shoulder elbow and wrist of the right upper extremity  Lymphadenopathy:    She has no cervical adenopathy.  Neurological: She is alert. Coordination normal.  Skin: Skin is warm and dry. No rash noted. No erythema.  Psychiatric: She has a normal mood and affect. Her behavior is normal.    ED Course  Procedures (including critical care time) Labs Review Labs Reviewed  CBC - Abnormal; Notable for the following:    Hemoglobin 11.7 (*)    All other components within normal limits  I-STAT CG4 LACTIC ACID, ED    Imaging Review Dg Forearm Right  10/08/2013   CLINICAL DATA:  Right forearm pain and edema with nodules medially for the past 6 days  EXAM: RIGHT FOREARM - 2 VIEW  COMPARISON:  None.  FINDINGS: The radius and ulna are adequately mineralized. There is no acute or healing fracture. There is no significant abnormality at the elbow or wrist joints. The soft tissues exhibit arterial calcifications. No suspicious masses are demonstrated. There is no soft tissue gas.  IMPRESSION: There is no acute bony abnormality of the forearm. Arterial calcification likely reflect underlying  diabetes. No soft tissue masses or gas collections are demonstrated.   Electronically Signed   By: David  Martinique   On: 10/08/2013 14:59      MDM   Final diagnoses:  Pain and swelling of right forearm    The patient's exam is rather benign, her vital signs are normal, given her immunocompromised state we'll check a chest x-ray, lactic acid and CBC though I suspect the patient had an abscess it appears to have spontaneously drained. There does appear to be some spotty lymphadenopathy the right forearm, probably deserving of antibiotics  Xray and labs normal, lower risk, abx and f/u, pt in agreement.  Meds given in ED:  Medications - No data to display  New Prescriptions   DOXYCYCLINE (VIBRAMYCIN) 100 MG CAPSULE    Take 1 capsule (100 mg total) by mouth 2 (two) times daily.      Johnna Acosta, MD 10/08/13 [phone]

## 2013-10-08 NOTE — ED Notes (Signed)
Rt. Lower arm swelling, warmth- off and on.  Pain has improved last 4 days. An sore that has scabbed over.

## 2013-10-08 NOTE — ED Notes (Signed)
Pt returned from radiology.

## 2013-10-18 ENCOUNTER — Ambulatory Visit: Payer: Medicare Other | Admitting: Internal Medicine

## 2013-10-18 ENCOUNTER — Other Ambulatory Visit: Payer: Self-pay | Admitting: Internal Medicine

## 2013-10-21 ENCOUNTER — Encounter: Payer: Self-pay | Admitting: Pulmonary Disease

## 2013-10-21 ENCOUNTER — Ambulatory Visit (INDEPENDENT_AMBULATORY_CARE_PROVIDER_SITE_OTHER): Payer: Medicare Other | Admitting: Pulmonary Disease

## 2013-10-21 VITALS — BP 122/70 | HR 76 | Temp 98.1°F | Ht 65.0 in | Wt 193.2 lb

## 2013-10-21 DIAGNOSIS — I2510 Atherosclerotic heart disease of native coronary artery without angina pectoris: Secondary | ICD-10-CM

## 2013-10-21 DIAGNOSIS — G4733 Obstructive sleep apnea (adult) (pediatric): Secondary | ICD-10-CM

## 2013-10-21 NOTE — Assessment & Plan Note (Signed)
Your Bipap is set at 17/13 cm Weight loss encouraged, compliance with goal of at least 6 hrs every night is the expectation. Advised against medications with sedative side effects Cautioned against driving when sleepy - understanding that sleepiness will vary on a day to day basis

## 2013-10-21 NOTE — Progress Notes (Signed)
   Subjective:    Patient ID: Monica Lee, female    DOB: 1950/12/26, 63 y.o.   MRN: [id number]  HPI  63 y/o woman for followup of severe OSA  Past medical history of DM, GERD, s/p renal transplant on immunosurpressives, asthma, breast cancer s/p chemoRx, blindness in left eye, chronic diastolic CHF admitted In Feb 2015  for necrotizing soft tissue infection  in her left thigh.  She was diagnosed with breast cancer in 11/2012 and started chemotherapy with the Port-A-Cath being placed in December 2014  She takes albuterol for occasional dyspnea and is maintained on Imuran and prednisone for immunosuppression   06/2013 -PSG - AHi 67/h, Due to intolerance of high pressures with CPAP,BiPAP initiated at 17/13 centimeters with a small full face mask    Download 06/2013 - on bipap - good usage except last couple of days (due to headache) - no residuals, leak ok   10/21/2013  Chief Complaint  Patient presents with  . Follow-up    Pt reports good compliance with CPAP nightly. No omplaints with pressure setting. Pt states that once humidity gets to max levels maks leaks.     2 month follow up for OSA.  Completed chemo for breast CA 06/2013 Was not wearing her BiPAP regularly due to HA and mask discomfort and leaks.  Download 07/2013 w/ decreased usage, ~40% 12/30 days.  Says her new mask is leaking. She did not use for a while because thought the mask was causing headache.  Download 10/2013 - better comliance 5-6h, but missed days, leak ++ Headaches have improved No dyspnea  Review of Systems neg for any significant sore throat, dysphagia, itching, sneezing, nasal congestion or excess/ purulent secretions, fever, chills, sweats, unintended wt loss, pleuritic or exertional cp, hempoptysis, orthopnea pnd or change in chronic leg swelling. Also denies presyncope, palpitations, heartburn, abdominal pain, nausea, vomiting, diarrhea or change in bowel or urinary habits, dysuria,hematuria, rash,  arthralgias, visual complaints, headache, numbness weakness or ataxia.     Objective:   Physical Exam  Gen. Pleasant, obese, in no distress ENT - no lesions, no post nasal drip Neck: No JVD, no thyromegaly, no carotid bruits Lungs: no use of accessory muscles, no dullness to percussion, decreased without rales or rhonchi  Cardiovascular: Rhythm regular, heart sounds  normal, no murmurs or gallops, no peripheral edema Musculoskeletal: No deformities, no cyanosis or clubbing , no tremors        Assessment & Plan:

## 2013-10-21 NOTE — Patient Instructions (Signed)
Your Bipap is set at 17/13 cm Weight loss encouraged, compliance with goal of at least 6 hrs every night is the expectation. Advised against medications with sedative side effects Cautioned against driving when sleepy - understanding that sleepiness will vary on a day to day basis

## 2013-10-27 ENCOUNTER — Other Ambulatory Visit: Payer: Self-pay | Admitting: Cardiovascular Disease

## 2013-11-15 ENCOUNTER — Encounter: Payer: Self-pay | Admitting: Internal Medicine

## 2013-11-15 ENCOUNTER — Ambulatory Visit (INDEPENDENT_AMBULATORY_CARE_PROVIDER_SITE_OTHER): Payer: Medicare Other | Admitting: Internal Medicine

## 2013-11-15 VITALS — BP 118/62 | HR 70 | Temp 98.2°F | Resp 12 | Wt 189.0 lb

## 2013-11-15 DIAGNOSIS — I2510 Atherosclerotic heart disease of native coronary artery without angina pectoris: Secondary | ICD-10-CM

## 2013-11-15 DIAGNOSIS — E1151 Type 2 diabetes mellitus with diabetic peripheral angiopathy without gangrene: Secondary | ICD-10-CM | POA: Diagnosis not present

## 2013-11-15 MED ORDER — INSULIN REGULAR HUMAN 100 UNIT/ML IJ SOLN: INTRAMUSCULAR | Status: DC

## 2013-11-15 MED ORDER — INSULIN NPH (HUMAN) (ISOPHANE) 100 UNIT/ML ~~LOC~~ SUSP: SUBCUTANEOUS | Status: DC

## 2013-11-15 NOTE — Patient Instructions (Addendum)
Please stop Lantus and Novolog. Please start the following insulins  - Insulin N (long acting) and Insulin R (short acting) ReliOn brand. Please review the handout about mixing the insulins.  Insulin Before breakfast Before lunch Before dinner  Regular 18 18 20   NPH 28  20   Continue current sliding scale added to the Regular insulin dose:  - 150- 160: + 1 unit  - 161- 170: + 2 units  - 171- 180: + 3 units  - 181- 190: + 4 units  - >190: + 5 units  Please inject the insulin 30 min before meals.  Please return in 1 month with your sugar log.

## 2013-11-15 NOTE — Progress Notes (Signed)
Patient ID: Monica Lee, female   DOB: 03/06/1950, 63 y.o.   MRN: [id number]  HPI: Monica Lee is a 63 y.o.-year-old woman, returning for f/u for DM2, dx 1980s, insulin-dependent, uncontrolled, with multiple complications (diabetic retinopathy, history of cadaveric kidney transplant in 05/2006 in [place] - now with CKD stage III, gastroparesis, CAD-history of MI in 1937, CHF- diastolic dysfunction, history of TIA and stroke in 2008). Last visit 2 mo ago.   Reviewed labs since last visit: Received labs from [place] (Dr. Jimmy Footman) from 08/31/2013: - HbA1c 10.6%. Per dr's note, she ran out of Lantus and has not been taking it regularly before running out, either. He gave her samples.   Last hemoglobin A1c was: Lab Results  Component Value Date   HGBA1C 9.9* 09/29/2013   HGBA1C 9.5* 06/01/2013   HGBA1C 10.7* 02/14/2013  Her hemoglobin A1c results are discordant with her very carefully documented sugar logs.   She is on Prednisone 10 mg daily for her kidney transplant.   Pt is on a regimen of: - Lantus 55 >> 45 units (vials) - Novolog 13-13-16 >> 15-15-17 units (vials) - Novolog SSI: target 150, ISF 10  Pt checks her sugars 3-4x a day and they are (no log today): - am: 59-144 >> 79-160 (most sugars 90-130) >> 57-190, mostly 100-130 >> 75-170, 2 values >200 >> 50 x 1, 90-240-973 >> 97-160 >>112-231 - pre-lunch: 55-174 >> 83-190, mostly low 100s >> 63-168, mostly ~100 >> 88-160s >> 114-174, 252 x1 >> 135 >> 113, 127-213 - pre-dinner: 77-197>> 73-176, mostly low 100s >> not checking >> 75-150s  >> 113-160, 199 x1 >> 120-130 >> 112, 116-216, 325 - bedtime: 81-144 >> 64,150,177 >> 101-198, mostly 100-150 >> 90-160 >> 125, 147, 202 >> 150s >> n/c  No more lows in am; lowest 112; she has hypoglycemia awareness at 80. Highest sugar was 240 in last mo (x1) >> 325  - pt has chronic kidney disease, last BUN, creatinine: BUN  Date Value Ref Range Status  09/29/2013 22 6 - 23 mg/dL Final    CREATININE, SER  Date Value Ref Range Status  09/29/2013 1.0 0.4 - 1.2 mg/dL Final  Seen by nephrology.  - Last set of lipids: Lab Results  Component Value Date   CHOL 134 09/29/2013   HDL 35.80* 09/29/2013   LDLCALC 68 09/04/2010   LDLDIRECT 57.7 09/29/2013   TRIG 264.0* 09/29/2013   CHOLHDL 4 09/29/2013   - last eye exam was in 05/12/2013. She had surgeries in the left eye for bleeding. She has cataracts. - Denies numbness and tingling in her legs. She sees podiatry.  She also has a history of hypertension, hyperlipidemia, GERD, DJD, headaches, history of nephrolithiasis, history of anemia, cataracts status post surgery, history of hyperthyroidism. 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 She then fell and developed a hematoma in L leg >> vacuum >> surgery.   I reviewed pt's medications, allergies, PMH, social hx, family hx and no changes required, except as mentioned above.  ROS: Constitutional: no weight loss, no fatigue, + subjective hyperthermia - sweating at night Eyes: no blurry vision, no xerophthalmia ENT: no sore throat, no nodules palpated in throat, no dysphagia/odynophagia, no hoarseness Cardiovascular: no CP/SOB/palpitations/leg swelling Respiratory: no cough/SOB/wheezing Gastrointestinal: no N/V/D/C Musculoskeletal: no muscle/joint aches Skin: + rash on back  PE: BP 118/62 mmHg  Pulse 70  Temp(Src) 98.2 F (36.8 C) (Oral)  Resp 12  Wt 189 lb (85.73 kg)  SpO2 94% Wt Readings from Last 3 Encounters:  11/15/13 189 lb (85.73 kg)  10/21/13 193 lb 3.2 oz (87.635 kg)  09/29/13 187 lb 12 oz (85.163 kg)   Constitutional: overweight, in NAD Eyes: surgical left pupil, EOMI, no exophthalmos ENT: moist mucous membranes, no thyromegaly, no cervical lymphadenopathy Cardiovascular: RRR, No MRG Respiratory: CTA B Gastrointestinal: abdomen soft, NT, ND, BS+ Musculoskeletal: no deformities, strength intact in all 4 Skin: moist,  warm Neurological: no tremor with outstretched hands, DTR normal in all 4  ASSESSMENT: 1. 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 Jimmy Footman - gastroparesis - CAD-history of MI in 2011, last cardiac cath was in 0272 - CHF-diastolic dysfunction, sees Dr. Verl Blalock - history of TIA and stroke in 2008  2. Papular rash on back  PLAN:  1. Patient with very long-standing diabetes, improved recently. No more low CBGs in last 2 mo, but she c/o hunger. Sugars not low during the episodes. - I advised her to: Patient Instructions   Please stop Lantus and Novolog. Please start the following insulins  - Insulin N (long acting) and Insulin R (short acting) ReliOn brand. Please review the handout about mixing the insulins.  Insulin Before breakfast Before lunch Before dinner  Regular 18 18 20   NPH 28  20   Continue current sliding scale added to the Regular insulin dose:  - 150- 160: + 1 unit  - 161- 170: + 2 units  - 171- 180: + 3 units  - 181- 190: + 4 units  - >190: + 5 units  Please inject the insulin 30 min before meals.  Please return in 1 month with your sugar log.   - will check a hemoglobin A1c at next visit - she had the flu vaccine today - I will see her back in 1 month with her sugar log  2. Papular rash on back - on a 10 cm area - appears healing

## 2013-11-16 ENCOUNTER — Encounter: Payer: Self-pay | Admitting: Oncology

## 2013-11-16 ENCOUNTER — Ambulatory Visit (INDEPENDENT_AMBULATORY_CARE_PROVIDER_SITE_OTHER): Payer: Medicare Other | Admitting: Cardiovascular Disease

## 2013-11-16 ENCOUNTER — Encounter: Payer: Self-pay | Admitting: Cardiovascular Disease

## 2013-11-16 VITALS — BP 144/62 | HR 73 | Ht 65.0 in | Wt 189.0 lb

## 2013-11-16 DIAGNOSIS — E1159 Type 2 diabetes mellitus with other circulatory complications: Secondary | ICD-10-CM

## 2013-11-16 DIAGNOSIS — I10 Essential (primary) hypertension: Secondary | ICD-10-CM

## 2013-11-16 DIAGNOSIS — I509 Heart failure, unspecified: Secondary | ICD-10-CM

## 2013-11-16 DIAGNOSIS — I110 Hypertensive heart disease with heart failure: Secondary | ICD-10-CM

## 2013-11-16 DIAGNOSIS — E785 Hyperlipidemia, unspecified: Secondary | ICD-10-CM

## 2013-11-16 DIAGNOSIS — Z940 Kidney transplant status: Secondary | ICD-10-CM

## 2013-11-16 DIAGNOSIS — I5033 Acute on chronic diastolic (congestive) heart failure: Secondary | ICD-10-CM

## 2013-11-16 DIAGNOSIS — I2510 Atherosclerotic heart disease of native coronary artery without angina pectoris: Secondary | ICD-10-CM | POA: Diagnosis not present

## 2013-11-16 NOTE — Assessment & Plan Note (Signed)
On imuran and sensipar  CR 1.0  F/u renal stable

## 2013-11-16 NOTE — Assessment & Plan Note (Signed)
Euvolemic mitral inflows not indicative of high filling pressures  Stable

## 2013-11-16 NOTE — Assessment & Plan Note (Signed)
Cholesterol is at goal.  Continue current dose of statin and diet Rx.  No myalgias or side effects.  F/U  LFT's in 6 months. Lab Results  Component Value Date   [place] 68 09/04/2010  Labs with primary when renal function checked

## 2013-11-16 NOTE — Assessment & Plan Note (Signed)
Discussed low carb diet.  Target hemoglobin A1c is 6.5 or less.  Continue current medications.  

## 2013-11-16 NOTE — Assessment & Plan Note (Signed)
Not clear if LVH is from HTN or HOCM/Amyloid  But given transplanted kidney not a candidate for MRI with gadolinium.  BP under good control and since there is no additional Rx for either will not pursue anything more drastic like biopsy unless she has new clinical signs / symptoms

## 2013-11-16 NOTE — Patient Instructions (Signed)
Your physician wants you to follow-up in:  6 MONTHS WITH DR NISHAN  You will receive a reminder letter in the mail two months in advance. If you don't receive a letter, please call our office to schedule the follow-up appointment. Your physician recommends that you continue on your current medications as directed. Please refer to the Current Medication list given to you today. 

## 2013-11-16 NOTE — Assessment & Plan Note (Signed)
Stable with no angina and good activity level.  Continue medical Rx  

## 2013-11-16 NOTE — Progress Notes (Signed)
Pt came in with concerns about her bills.  Pt has Medicare so I informed her that unfortunately the hospital will not grant a discount but will offer her a payment plan or the Access One card.  She said she already has the Access One card so I advised that she can get her balance forwarded to the card to make one payment.  She understood and will call to get her balance forwarded to the card.

## 2013-11-16 NOTE — Progress Notes (Signed)
Patient ID: Monica Lee, female   DOB: July 28, 1950, 63 y.o.   MRN: [id number] Monica Lee is a 63 y.o. female the history of M6QH, HTN, diastolic CHF, hyperthyroidism, renal failure status post renal transplant, breast CA, prior TIA. She apparently had cardiac catheterization several years ago in [place] that demonstrated no CAD. She was recently seen by me  for surgical clearance for breast cancer. Lexiscan Myoview (11/05/12): High-risk scan; fixed defect affecting the entire inferolateral and anterolateral wall; mid/apical anterior and mid/apical inferior moderate ischemia, EF 28%. Cardiac MRI (10/2012): Moderate to severe LVH, EF 55%, chordal SAM but no LVOT gradient, mod circumferential effusion, mild LAE. Echocardiogram (11/06/12): Severe LVH, EF 47-65%, grade 1 diastolic dysfunction, moderate effusion. Tests were reviewed by Dr. Percival Spanish. Given discrepancy of results and lack of symptoms, it was felt the nuclear study was false positive and she was cleared to proceed with lumpectomy on 11/08/12.  She was admitted 46/50-35/4 with a/c diastolic CHF. She presented to the ED with worsening dyspnea and an 8 pound weight gain, orthopnea and PND. She was diuresed with IV Lasix. She was seen by Dr. Haroldine Laws and the Heart Failure Team. Metolazone was added to her medical regimen.    Done with chemo Had  XRT in [facility] No CHF issues since being on higher dose diuretic   F/U echo 7/15 Study Conclusions  - Left ventricle: The cavity size was normal. There was severe concentric thickening of the ventricle (wall thickness 3 cm). Promient, bright endocardial stripe. Findings consistent with either infiltrative cardiomyopathy (Amyloid, sarcoid, etc) or possibly global variant hypertrophic cardiomyopathy. The estimated ejection fraction was 75%. Doppler parameters are consistent with abnormal left ventricular relaxation (grade 1 diastolic dysfunction). LV filling pressures are likely  elevated. - Mitral valve: Heavily calcified mitral annulus. Borderline mild mitral stenosis. Trace to mild regurgitation. - Left atrium: Severely dilated (67 ml/m2). - Tricuspid valve: There was mild regurgitation. - Systemic veins: The IVC was not visualized. - Pericardium, extracardiac: Small to moderate, circumferential pericardial effusion (more posterior). Features were not consistent with tamponade physiology.  Impressions:  - Findings similar to echo in 01/2013, severe concentric LVH, high concern for amyloid based on appearance, wall thickness now up to 3.0 cm, no dynamic LVOT obstruction. Small to moderate concentric pericardial effusion appears stable. No tamponade physiology is suspected, however, the IVC was not visualized.  Although Cr normal no MRI with gad done due to renal transplant history   ROS: Denies fever, malais, weight loss, blurry vision, decreased visual acuity, cough, sputum, SOB, hemoptysis, pleuritic pain, palpitaitons, heartburn, abdominal pain, melena, lower extremity edema, claudication, or rash.  All other systems reviewed and negative  General: Affect appropriate Obese black female  HEENT: normal Neck supple with no adenopathy JVP normal no bruits no thyromegaly Lungs clear with no wheezing and good diaphragmatic motion Heart:  S1/S2 soft SEM with no change valsalva murmur, no rub, gallop or click PMI normal Abdomen: benighn, BS positve, no tenderness, no AAA transplanted kidney lower abdomen  no bruit.  No HSM or HJR Distal pulses intact with no bruits No edema Neuro non-focal Skin warm and dry No muscular weakness   Current Outpatient Prescriptions  Medication Sig Dispense Refill  . albuterol (ACCUNEB) 0.63 MG/3ML nebulizer solution Take 1 ampule by nebulization every 6 (six) hours as needed for wheezing.    Marland Kitchen albuterol (PROVENTIL HFA;VENTOLIN HFA) 108 (90 BASE) MCG/ACT inhaler Inhale 2 puffs into the lungs every 6 (six)  hours as needed for wheezing or  shortness of breath.    Marland Kitchen amLODipine (NORVASC) 10 MG tablet TAKE 1 TABLET BY MOUTH EVERY DAY 90 tablet 1  . aspirin EC 81 MG tablet Take 81 mg by mouth daily.    Marland Kitchen azaTHIOprine (IMURAN) 50 MG tablet Take 75 mg by mouth daily.     . brimonidine (ALPHAGAN) 0.2 % ophthalmic solution Place 1 drop into the right eye 2 (two) times daily.     . cinacalcet (SENSIPAR) 30 MG tablet Take 30 mg by mouth every morning.    Mariane Baumgarten Sodium POWD 1 Package by Does not apply route daily. Puts in coffee    . feeding supplement, GLUCERNA SHAKE, (GLUCERNA SHAKE) LIQD Take 237 mLs by mouth 2 (two) times daily between meals.  0  . furosemide (LASIX) 80 MG tablet Take 160 mg by mouth 2 (two) times daily.     . insulin NPH Human (HUMULIN N,NOVOLIN N) 100 UNIT/ML injection Inject 48 units under the skin as advised 20 mL 2  . insulin regular (NOVOLIN R,HUMULIN R) 100 units/mL injection Inject up to 60 units under the skin as advised 20 mL 2  . isosorbide mononitrate (IMDUR) 60 MG 24 hr tablet Take 60 mg by mouth every morning.    Marland Kitchen KLOR-CON M20 20 MEQ tablet Takes 1/2 tab in morning and 1/2 at night.    . levothyroxine (SYNTHROID, LEVOTHROID) 75 MCG tablet Take 1 tablet (75 mcg total) by mouth daily. 90 tablet 3  . magnesium oxide (MAG-OX) 400 MG tablet Take 400 mg by mouth 2 (two) times daily.    . metoprolol (LOPRESSOR) 100 MG tablet TAKE 1 TABLET BY MOUTH TWICE A DAY 180 tablet 0  . minoxidil (LONITEN) 2.5 MG tablet Take 2.5 mg by mouth 2 (two) times daily.    . nitroGLYCERIN (NITROSTAT) 0.4 MG SL tablet Place 0.4 mg under the tongue every 5 (five) minutes as needed for chest pain.    . polyethylene glycol (MIRALAX / GLYCOLAX) packet     . pravastatin (PRAVACHOL) 20 MG tablet Take 10 mg by mouth at bedtime.      . prednisoLONE acetate (PRED FORTE) 1 % ophthalmic suspension Place 1 drop into the left eye 2 (two) times daily.     . predniSONE (DELTASONE) 10 MG tablet Take 10 mg by mouth  every morning.     . tacrolimus (PROGRAF) 1 MG capsule Take 9 mg by mouth 2 (two) times daily.     . timolol (TIMOPTIC) 0.25 % ophthalmic solution Place 1 drop into the right eye 2 (two) times daily.     No current facility-administered medications for this visit.    Allergies  Keflex and Propoxyphene n-acetaminophen  Electrocardiogram:  SR LVH with strain and prominent voltage 2014  Today SR rate 73 LVH with strain QT 468  Assessment and Plan

## 2013-11-26 ENCOUNTER — Other Ambulatory Visit: Payer: Self-pay | Admitting: Internal Medicine

## 2013-11-26 NOTE — Telephone Encounter (Signed)
Done erx 

## 2013-11-30 DIAGNOSIS — N183 Chronic kidney disease, stage 3 (moderate): Secondary | ICD-10-CM | POA: Diagnosis not present

## 2013-11-30 DIAGNOSIS — I129 Hypertensive chronic kidney disease with stage 1 through stage 4 chronic kidney disease, or unspecified chronic kidney disease: Secondary | ICD-10-CM | POA: Diagnosis not present

## 2013-11-30 DIAGNOSIS — E118 Type 2 diabetes mellitus with unspecified complications: Secondary | ICD-10-CM | POA: Diagnosis not present

## 2013-11-30 DIAGNOSIS — M1A20X1 Drug-induced chronic gout, unspecified site, with tophus (tophi): Secondary | ICD-10-CM | POA: Diagnosis not present

## 2013-11-30 DIAGNOSIS — N2581 Secondary hyperparathyroidism of renal origin: Secondary | ICD-10-CM | POA: Diagnosis not present

## 2013-11-30 DIAGNOSIS — Z940 Kidney transplant status: Secondary | ICD-10-CM | POA: Diagnosis not present

## 2013-12-07 ENCOUNTER — Telehealth: Payer: Self-pay | Admitting: Adult Health

## 2013-12-07 ENCOUNTER — Encounter: Payer: Self-pay | Admitting: Adult Health

## 2013-12-07 ENCOUNTER — Ambulatory Visit (HOSPITAL_BASED_OUTPATIENT_CLINIC_OR_DEPARTMENT_OTHER): Payer: Medicare Other

## 2013-12-07 ENCOUNTER — Ambulatory Visit (HOSPITAL_BASED_OUTPATIENT_CLINIC_OR_DEPARTMENT_OTHER): Payer: Medicare Other | Admitting: Adult Health

## 2013-12-07 VITALS — BP 131/64 | HR 76 | Temp 98.2°F | Resp 18 | Ht 65.0 in | Wt 191.3 lb

## 2013-12-07 DIAGNOSIS — Z853 Personal history of malignant neoplasm of breast: Secondary | ICD-10-CM | POA: Diagnosis not present

## 2013-12-07 DIAGNOSIS — C50511 Malignant neoplasm of lower-outer quadrant of right female breast: Secondary | ICD-10-CM

## 2013-12-07 DIAGNOSIS — Z95828 Presence of other vascular implants and grafts: Secondary | ICD-10-CM

## 2013-12-07 LAB — CBC WITH DIFFERENTIAL/PLATELET
BASO%: 0.9 % (ref 0.0–2.0)
BASOS ABS: 0.1 10*3/uL (ref 0.0–0.1)
EOS%: 1.1 % (ref 0.0–7.0)
Eosinophils Absolute: 0.1 10*3/uL (ref 0.0–0.5)
HCT: 41.6 % (ref 34.8–46.6)
HGB: 13.2 g/dL (ref 11.6–15.9)
LYMPH%: 12.2 % — ABNORMAL LOW (ref 14.0–49.7)
MCH: 27.8 pg (ref 25.1–34.0)
MCHC: 31.7 g/dL (ref 31.5–36.0)
MCV: 87.8 fL (ref 79.5–101.0)
MONO#: 0.7 10*3/uL (ref 0.1–0.9)
MONO%: 10.9 % (ref 0.0–14.0)
NEUT%: 74.9 % (ref 38.4–76.8)
NEUTROS ABS: 5.1 10*3/uL (ref 1.5–6.5)
PLATELETS: 208 10*3/uL (ref 145–400)
RBC: 4.74 10*6/uL (ref 3.70–5.45)
RDW: 16.4 % — AB (ref 11.2–14.5)
WBC: 6.8 10*3/uL (ref 3.9–10.3)
lymph#: 0.8 10*3/uL — ABNORMAL LOW (ref 0.9–3.3)

## 2013-12-07 LAB — COMPREHENSIVE METABOLIC PANEL (CC13)
ALBUMIN: 4.2 g/dL (ref 3.5–5.0)
ALT: 14 U/L (ref 0–55)
ANION GAP: 13 meq/L — AB (ref 3–11)
AST: 21 U/L (ref 5–34)
Alkaline Phosphatase: 55 U/L (ref 40–150)
BUN: 20.5 mg/dL (ref 7.0–26.0)
CALCIUM: 9.7 mg/dL (ref 8.4–10.4)
CHLORIDE: 101 meq/L (ref 98–109)
CO2: 31 meq/L — AB (ref 22–29)
Creatinine: 1.1 mg/dL (ref 0.6–1.1)
GLUCOSE: 72 mg/dL (ref 70–140)
POTASSIUM: 3.7 meq/L (ref 3.5–5.1)
SODIUM: 145 meq/L (ref 136–145)
TOTAL PROTEIN: 7.4 g/dL (ref 6.4–8.3)
Total Bilirubin: 0.43 mg/dL (ref 0.20–1.20)

## 2013-12-07 MED ORDER — HEPARIN SOD (PORK) LOCK FLUSH 100 UNIT/ML IV SOLN
500.0000 [IU] | Freq: Once | INTRAVENOUS | Status: AC
  Administered 2013-12-07: 500 [IU] via INTRAVENOUS
  Filled 2013-12-07: qty 5

## 2013-12-07 MED ORDER — SODIUM CHLORIDE 0.9 % IJ SOLN
10.0000 mL | INTRAMUSCULAR | Status: DC | PRN
  Administered 2013-12-07: 10 mL via INTRAVENOUS
  Filled 2013-12-07: qty 10

## 2013-12-07 NOTE — Progress Notes (Signed)
[place]  Telephone:([phone]) [phone] Fax:(336) [phone]     ID: Monica Lee OB: 1950/03/21  MR#: [id number]  RJJ#:[id number]  PCP: [name], MD GYN:   SUGerald Stabs Streck] OTHER MD: Theodoro Kos, Arloa Koh  CHIEF COMPLAINT: Follow up for breast cancer.    BREAST CANCER HISTORY: The patient herself palpated a mass in her right breast. She brought it to medical attention at the time of her routinely scheduled mammogram 10/08/2012. This showed an irregular mass in the right breast lower outer quadrant which by ultrasound measured 2.1 cm. Biopsy of the mass was performed the same day, and showed ([id number]) invasive ductal carcinoma, grade 3, triple negative, with an MIB-1 of 56%. She proceeded to right lumpectomy and sentinel lymph node sampling 11/09/2012 with the pathology(SZA 14-04/07/2001 showing invasive ductal carcinoma, grade 3, measuring 2.2 cm, with negative margins, and all 3 sentinel lymph nodes benign. Repeat prognostic panel was again triple negative. The HER-2 signals ratio was 1.38 and the number per cell 2.2.  Her subsequent history is as detailed below  INTERVAL HISTORY: Monica Lee returns today for followup of her breast cancer.  She is doing well today.  She is exercising occasionally.  She denies new pain, fevers, unintentional weight loss, chest pain, headaches, weakness, bowel/bladder changes, or any further concerns.      REVIEW OF SYSTEMS: A 10 point review of systems was conducted and is otherwise negative except for what is noted above.     PAST MEDICAL HISTORY: Past Medical History  Diagnosis Date  . S/P kidney transplant     CADAVERIC --  05/2006  . Hyperlipidemia   . DJD (degenerative joint disease)   . Hx of colonic polyps   . Diabetic retinopathy     RIGHT EYE  . Hypertension   . Asthma   . Blind left eye     OCCLUDED CORNEA,  FUNDI--  FAILED SURGERY REPAIR 2003  . Chronic diastolic CHF (congestive heart failure)     a.  Cardiac MRI (10/2012): Moderate to severe LVH, EF 55%, chordal SAM but no LVOT gradient, mod circumferential effusion, mild LAE.  b.  Echocardiogram (11/06/12): Severe LVH, EF 32-35%, grade 1 diastolic dysfunction, moderate effusion.  . Wears glasses   . History of myocardial infarction     2011--  S/P CARDIAC CATH ([place])  NON-OBSTRUCTIVE CAD  . GERD (gastroesophageal reflux disease)   . History of kidney stones   . History of CVA (cerebrovascular accident) without residual deficits     2008  AND TIA IN 2008  W/ NO RESIDUAL  . History of peptic ulcer     2011--  RESOLVED  . 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 Gastroparesis due to DM   . Chronic kidney disease (CKD), stage III (moderate)     NEPHROLOGIST--  DR Freddrick March  . Open thigh wound     ANTERIOR  . Coronary artery disease CARDIOLOGIST--  DR Johnsie Cancel    NON-OBSTRUCTIVE CAD  PER CARDIAC CATH  2011  . Pericardial effusion without cardiac tamponade     PER DR NISHAN  NOTE --  NOT MALIGNANT  ? RELATED TO HYPOTHYROIDISM  . Thyromegaly     MULTINODULER GOITER  . Hyperparathyroidism, secondary renal   . Anemia in chronic kidney disease   .  Trigeminal neuralgia   . Hypothyroidism   . At risk for sleep apnea     STOP-BANG= 4   SENT TO PCP  03-25-2013    PAST SURGICAL HISTORY: Past Surgical History  Procedure Laterality Date  . Transplantation renal  05/2006    CADAVERIC  . Breast biopsy  2010  . Cholecystectomy  2008  . Hemorrhoid surgery  01/23/2012    Procedure: HEMORRHOIDECTOMY PROLAPSED;  Surgeon: Leighton Ruff, MD;  Location: [facility];  Service: General;  Laterality: N/A;  . Foot surgery Right 2013  . Breast lumpectomy with sentinel lymph node biopsy Right 11/09/2012    Procedure: RIGHT BREAST LUMPECTOMY WITH SENTINEL LYMPH NODE REMOVAL;  Surgeon:  Haywood Lasso, MD;  Location: [place];  Service: General;  Laterality: Right;  . Portacath placement Right 11/30/2012    Procedure: INSERTION PORT-A-CATH;  Surgeon: Haywood Lasso, MD;  Location: [place];  Service: General;  Laterality: Right;  . Irrigation and debridement abscess Left 02/14/2013    Procedure: IRRIGATION AND DEBRIDEMENT LEFT THIGH ABSCESS;  Surgeon: Joyice Faster. Cornett, MD;  Location: [place];  Service: General;  Laterality: Left;  . Incision and drainage of wound Left 02/18/2013    Procedure: IRRIGATION AND DEBRIDEMENT THIGH WOUND;  Surgeon: Rolm Bookbinder, MD;  Location: [place];  Service: General;  Laterality: Left;  . Cataract extraction w/ intraocular lens  implant, bilateral    . Repair  intestinal perforation surgery  01-31-2009  . Cardiac catheterization  04-12-2009  DR EVELYNE GOUDREAU ([facility] IN [place], [place])    NON-OBSTRUCTIVE CAD/  mLAD 40%,  oLAD 50%,  dCFX 40%,  OM1  40%,  mRCA  &  dRCA  50%/  LVEF 65% /  ELEVATED  LVEDP  . Transthoracic echocardiogram  01-19-2013  DR NISHAN    SEVERE LVH/  EF 45-36%/  GRADE I DIASTOLIC DYSFUNCTION/  MODERATE LAE/  MILD IMPROVEMENT OF MODERATE CIRCUMFERENTIAL INFERIOROLATERAL PERICARDIAL EFFUSION WITH NO TAMPONADE  . Cardiovascular stress test  11-04-2012  DR NISHAN    HIGH RISK NUCLEAR STUDY/  FIXED DEFECT AFFECTING ENTIRE INFEROLATERAL AND ANTEROLATERAL WALL/  MODERATE ISCHEMIA  AT MID/ APICAL ANTEROR AND INFERIOR WALL/  GLOBAL HYPOKINESIS/  LVEF 28% (FELT TO BE FALSE +,  ECHO & cMRI  NORMAL EF AND NORMAL WM)  . Vaginal hysterectomy  1990's    W/  BILATERAL SALPINGOOPHORECTOMY  . Eye surgery Left 2003    REPAIR CORNEA  . Skin split graft Left 03/29/2013    Procedure: SKIN GRAFT SPLIT THICKNESS WITH A-CELL AND VAC TO LEFT THIGH;  Surgeon: Theodoro Kos, DO;  Location: [facility];  Service: Plastics;  Laterality: Left;    FAMILY HISTORY Family History  Problem Relation Age of Onset  . Kidney  disease Neg Hx   . Arthritis Other     parent  . Heart disease Other     parent  . Hypertension Other     parent  . Diabetes Other     parent  . Diabetes Other     grandparent  . Thyroid disease Other     several  . Heart disease Father   The patient's father died at the age of 34 from a myocardial infarction. The patient's mother died in childbirth. The patient had 2 brothers, one sister. There is no history of breast or ovarian cancer in the family to her knowledge.  GYNECOLOGIC HISTORY:  Menarche age 89, first live birth age 5. The patient is  GX P4. Gender 1 hysterectomy without salpingo-oophorectomy in the early 1990s. She did not take hormone replacement.   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 Her husband Wille Glaser used to work in Architect. He is also retired. It's just the 2 of them at home. She has 4 grandchildren. She attends a local [employer] DIRECTIVES:    HEALTH MAINTENANCE: History  Substance Use Topics  . Smoking status: Never Smoker   . Smokeless tobacco: Never Used  . Alcohol Use: No    Mammo: 09/2012  Colonoscopy:  2013, Dr. Donna Christen, unsure of f/u recommendation  Bone density: unsure  Lipid panel: 2015  Allergies  Allergen Reactions  . Keflex [Cephalexin] Swelling    Swelling to face, chills Tolerates zosyn 02/14/13  . Propoxyphene N-Acetaminophen Hives and Itching    Current Outpatient Prescriptions  Medication Sig Dispense Refill  . albuterol (ACCUNEB) 0.63 MG/3ML nebulizer solution Take 1 ampule by nebulization every 6 (six) hours as needed for wheezing.    Marland Kitchen albuterol (PROVENTIL HFA;VENTOLIN HFA) 108 (90 BASE) MCG/ACT inhaler Inhale 2 puffs into the lungs every 6 (six) hours as needed for wheezing or shortness of breath.    Marland Kitchen amLODipine (NORVASC) 10 MG tablet TAKE 1 TABLET BY MOUTH EVERY DAY 90 tablet 1  . aspirin EC 81 MG tablet Take 81 mg by mouth daily.    Marland Kitchen azaTHIOprine (IMURAN) 50 MG tablet Take 75 mg  by mouth daily.     . brimonidine (ALPHAGAN) 0.2 % ophthalmic solution Place 1 drop into the right eye 2 (two) times daily.     . cinacalcet (SENSIPAR) 30 MG tablet Take 30 mg by mouth every morning.    Mariane Baumgarten Sodium POWD 1 Package by Does not apply route daily. Puts in coffee    . feeding supplement, GLUCERNA SHAKE, (GLUCERNA SHAKE) LIQD Take 237 mLs by mouth 2 (two) times daily between meals.  0  . furosemide (LASIX) 80 MG tablet Take 160 mg by mouth 2 (two) times daily.     . insulin NPH Human (HUMULIN N,NOVOLIN N) 100 UNIT/ML injection Inject 48 units under the skin as advised 20 mL 2  . insulin regular (NOVOLIN R,HUMULIN R) 100 units/mL injection Inject up to 60 units under the skin as advised 20 mL 2  . isosorbide mononitrate (IMDUR) 60 MG 24 hr tablet Take 60 mg by mouth every morning.    Marland Kitchen KLOR-CON M20 20 MEQ tablet Takes 1/2 tab in morning and 1/2 at night.    . levothyroxine (SYNTHROID, LEVOTHROID) 75 MCG tablet Take 1 tablet (75 mcg total) by mouth daily. 90 tablet 3  . magnesium oxide (MAG-OX) 400 MG tablet Take 400 mg by mouth 2 (two) times daily.    . metoprolol (LOPRESSOR) 100 MG tablet TAKE 1 TABLET BY MOUTH TWICE A DAY 180 tablet 0  . minoxidil (LONITEN) 2.5 MG tablet Take 2.5 mg by mouth 2 (two) times daily.    . nitroGLYCERIN (NITROSTAT) 0.4 MG SL tablet Place 0.4 mg under the tongue every 5 (five) minutes as needed for chest pain.    . polyethylene glycol (MIRALAX / GLYCOLAX) packet     . pravastatin (PRAVACHOL) 20 MG tablet Take 10 mg by mouth at bedtime.      . prednisoLONE acetate (PRED FORTE) 1 % ophthalmic suspension Place 1 drop into the left eye 2 (two) times daily.     . predniSONE (DELTASONE) 10 MG tablet Take 10  mg by mouth every morning.     . predniSONE (DELTASONE) 10 MG tablet TAKE 1 TABLET BY MOUTH EVERY DAY 90 tablet 1  . tacrolimus (PROGRAF) 1 MG capsule Take 9 mg by mouth 2 (two) times daily.     . timolol (TIMOPTIC) 0.25 % ophthalmic solution Place 1  drop into the right eye 2 (two) times daily.     No current facility-administered medications for this visit.    OBJECTIVE: Middle-aged [place] American woman in no acute distress  Filed Vitals:   12/07/13 1100  BP: 131/64  Pulse: 76  Temp: 98.2 F (36.8 C)  Resp: 18     Body mass index is 31.83 kg/(m^2).     GENERAL: Patient is a well appearing female in no acute distress HEENT:  Sclerae anicteric.  Oropharynx clear and moist. No ulcerations or evidence of oropharyngeal candidiasis. Neck is supple.  NODES:  No cervical, supraclavicular, or axillary lymphadenopathy palpated.  BREAST EXAM:  S/p lumpectomy, no nodularity no sign of recurrence, no mass or nodules in either breast.  Benign bilateral breast exam LUNGS:  Clear to auscultation bilaterally.  No wheezes or rhonchi. HEART:  Regular rate and rhythm. No murmur appreciated. ABDOMEN:  Soft, nontender.  Positive, normoactive bowel sounds. No organomegaly palpated. MSK:  No focal spinal tenderness to palpation. Full range of motion bilaterally in the upper extremities. EXTREMITIES:  No peripheral edema.   SKIN:  Clear with no obvious rashes or skin changes. No nail dyscrasia. NEURO:  Nonfocal. Well oriented.  Appropriate affect. ECOG FS:1 - Symptomatic but completely ambulatory    LAB RESULTS:  CMP     Component Value Date/Time   NA 138 09/29/2013 1440   NA 139 09/06/2013 1045   K 4.1 09/29/2013 1440   K 3.9 09/06/2013 1045   CL 103 09/29/2013 1440   CO2 29 09/29/2013 1440   CO2 30* 09/06/2013 1045   GLUCOSE 269* 09/29/2013 1440   GLUCOSE 416* 09/06/2013 1045   BUN 22 09/29/2013 1440   BUN 26.6* 09/06/2013 1045   CREATININE 1.0 09/29/2013 1440   CREATININE 1.2* 09/06/2013 1045   CALCIUM 8.9 09/29/2013 1440   CALCIUM 8.7 09/06/2013 1045   PROT 7.5 09/29/2013 1440   PROT 7.0 09/06/2013 1045   ALBUMIN 4.1 09/29/2013 1440   ALBUMIN 3.8 09/06/2013 1045   AST 17 09/29/2013 1440   AST 15 09/06/2013 1045   ALT 11  09/29/2013 1440   ALT 8 09/06/2013 1045   ALKPHOS 45 09/29/2013 1440   ALKPHOS 50 09/06/2013 1045   BILITOT 0.6 09/29/2013 1440   BILITOT 0.46 09/06/2013 1045   GFRNONAA 60* 02/22/2013 1017   GFRAA 69* 02/22/2013 1017    I No results found for: SPEP  Lab Results  Component Value Date   WBC 4.8 10/08/2013   NEUTROABS 5.1 09/29/2013   HGB 11.7* 10/08/2013   HCT 37.0 10/08/2013   MCV 92.0 10/08/2013   PLT 188 10/08/2013      Chemistry      Component Value Date/Time   NA 138 09/29/2013 1440   NA 139 09/06/2013 1045   K 4.1 09/29/2013 1440   K 3.9 09/06/2013 1045   CL 103 09/29/2013 1440   CO2 29 09/29/2013 1440   CO2 30* 09/06/2013 1045   BUN 22 09/29/2013 1440   BUN 26.6* 09/06/2013 1045   CREATININE 1.0 09/29/2013 1440   CREATININE 1.2* 09/06/2013 1045      Component Value Date/Time   CALCIUM 8.9 09/29/2013 1440  CALCIUM 8.7 09/06/2013 1045   ALKPHOS 45 09/29/2013 1440   ALKPHOS 50 09/06/2013 1045   AST 17 09/29/2013 1440   AST 15 09/06/2013 1045   ALT 11 09/29/2013 1440   ALT 8 09/06/2013 1045   BILITOT 0.6 09/29/2013 1440   BILITOT 0.46 09/06/2013 1045       No results found for: LABCA2  No components found for: LABCA125  No results for input(s): INR in the last 168 hours.  Urinalysis    Component Value Date/Time   COLORURINE AMBER* 02/14/2013 0228   APPEARANCEUR CLOUDY* 02/14/2013 0228   LABSPEC 1.023 02/14/2013 0228   PHURINE 5.5 02/14/2013 0228   GLUCOSEU 250* 02/14/2013 0228   GLUCOSEU NEGATIVE 12/05/2011 1027   HGBUR TRACE* 02/14/2013 0228   BILIRUBINUR SMALL* 02/14/2013 0228   KETONESUR 15* 02/14/2013 0228   PROTEINUR 100* 02/14/2013 0228   UROBILINOGEN 1.0 02/14/2013 0228   NITRITE NEGATIVE 02/14/2013 0228   LEUKOCYTESUR NEGATIVE 02/14/2013 0228    STUDIES: No results found.  ASSESSMENT: 63 y.o. Monica Lee, [place] woman status post right lumpectomy and sentinel lymph node sampling 11/09/2012 for a pT2 pN0, stage IIA invasive ductal  carcinoma, grade 3, triple negative, with an MIB-1 of 56%  (1) started adjuvant cyclophosphamide, methotrexate and fluorouracil 12-2012, received 3 cycles, then had a fall with injury to her left leg requiring skin grafting, and her subsequent cycles were delayed.  (2) resuming CMF treatment 05/24/2013, to be completed 07/05/2013  (3) adjuvant radiation in [place], [place] beginning August 09, 2013 through 09/15/13.  PLAN:  Monica Lee is doing well today.  She has no sign of recurrence.  She is due for a mammogram which I have requested. I recommended healthy diet, exercise, and monthly breast exams.  She is otherwise doing well and without question or concerns.     Monica Lee will return for f/u and close observation in 3 months.   She knows to call us in the interim for any questions or concerns.  We can certainly see her sooner if needed.  I spent 25 minutes counseling the patient face to face.  The total time spent in the appointment was 30 minutes.  Minette Headland, [place] [phone] 12/07/2013 11:03 AM

## 2013-12-07 NOTE — Patient Instructions (Signed)
You are doing well.  You have no sign of recurrence.  I recommend healthy diet, exercise and monthly breast exams.    Breast Self-Awareness Practicing breast self-awareness may pick up problems early, prevent significant medical complications, and possibly save your life. By practicing breast self-awareness, you can become familiar with how your breasts look and feel and if your breasts are changing. This allows you to notice changes early. It can also offer you some reassurance that your breast health is good. One way to learn what is normal for your breasts and whether your breasts are changing is to do a breast self-exam. If you find a lump or something that was not present in the past, it is best to contact your caregiver right away. Other findings that should be evaluated by your caregiver include nipple discharge, especially if it is bloody; skin changes or reddening; areas where the skin seems to be pulled in (retracted); or new lumps and bumps. Breast pain is seldom associated with cancer (malignancy), but should also be evaluated by a caregiver. HOW TO PERFORM A BREAST SELF-EXAM The best time to examine your breasts is 5-7 days after your menstrual period is over. During menstruation, the breasts are lumpier, and it may be more difficult to pick up changes. If you do not menstruate, have reached menopause, or had your uterus removed (hysterectomy), you should examine your breasts at regular intervals, such as monthly. If you are breastfeeding, examine your breasts after a feeding or after using a breast pump. Breast implants do not decrease the risk for lumps or tumors, so continue to perform breast self-exams as recommended. Talk to your caregiver about how to determine the difference between the implant and breast tissue. Also, talk about the amount of pressure you should use during the exam. Over time, you will become more familiar with the variations of your breasts and more comfortable with the  exam. A breast self-exam requires you to remove all your clothes above the waist. 1. Look at your breasts and nipples. Stand in front of a mirror in a room with good lighting. With your hands on your hips, push your hands firmly downward. Look for a difference in shape, contour, and size from one breast to the other (asymmetry). Asymmetry includes puckers, dips, or bumps. Also, look for skin changes, such as reddened or scaly areas on the breasts. Look for nipple changes, such as discharge, dimpling, repositioning, or redness. 2. Carefully feel your breasts. This is best done either in the shower or tub while using soapy water or when flat on your back. Place the arm (on the side of the breast you are examining) above your head. Use the pads (not the fingertips) of your three middle fingers on your opposite hand to feel your breasts. Start in the underarm area and use  inch (2 cm) overlapping circles to feel your breast. Use 3 different levels of pressure (light, medium, and firm pressure) at each circle before moving to the next circle. The light pressure is needed to feel the tissue closest to the skin. The medium pressure will help to feel breast tissue a little deeper, while the firm pressure is needed to feel the tissue close to the ribs. Continue the overlapping circles, moving downward over the breast until you feel your ribs below your breast. Then, move one finger-width towards the center of the body. Continue to use the  inch (2 cm) overlapping circles to feel your breast as you move slowly up toward  the collar bone (clavicle) near the base of the neck. Continue the up and down exam using all 3 pressures until you reach the middle of the chest. Do this with each breast, carefully feeling for lumps or changes. 3.  Keep a written record with breast changes or normal findings for each breast. By writing this information down, you do not need to depend only on memory for size, tenderness, or location.  Write down where you are in your menstrual cycle, if you are still menstruating. Breast tissue can have some lumps or thick tissue. However, see your caregiver if you find anything that concerns you.  SEEK MEDICAL CARE IF:  You see a change in shape, contour, or size of your breasts or nipples.   You see skin changes, such as reddened or scaly areas on the breasts or nipples.   You have an unusual discharge from your nipples.   You feel a new lump or unusually thick areas.  Document Released: 12/31/2004 Document Revised: 12/18/2011 Document Reviewed: 04/17/2011 [employer] Patient Information 2015 [employer]. This information is not intended to replace advice given to you by your health care provider. Make sure you discuss any questions you have with your health care provider.  

## 2013-12-07 NOTE — Patient Instructions (Signed)

## 2013-12-07 NOTE — Telephone Encounter (Signed)
per pof to sch pt appt-cld to -sch pt appt w/[facility]-Stephanie stated she will call pt directy for portremoval-needed to add pt to new system--sch pt mamma appt

## 2013-12-13 DIAGNOSIS — C50511 Malignant neoplasm of lower-outer quadrant of right female breast: Secondary | ICD-10-CM | POA: Diagnosis not present

## 2013-12-23 ENCOUNTER — Ambulatory Visit
Admission: RE | Admit: 2013-12-23 | Discharge: 2013-12-23 | Disposition: A | Discharge status: Home / Self Care | Payer: Medicare Other | Source: Ambulatory Visit | Attending: Adult Health | Admitting: Adult Health

## 2013-12-23 ENCOUNTER — Other Ambulatory Visit: Payer: Self-pay | Admitting: Adult Health

## 2013-12-23 DIAGNOSIS — Z853 Personal history of malignant neoplasm of breast: Secondary | ICD-10-CM

## 2013-12-23 DIAGNOSIS — N63 Unspecified lump in breast: Secondary | ICD-10-CM | POA: Diagnosis not present

## 2013-12-28 ENCOUNTER — Ambulatory Visit (INDEPENDENT_AMBULATORY_CARE_PROVIDER_SITE_OTHER): Payer: Medicare Other | Admitting: Internal Medicine

## 2013-12-28 ENCOUNTER — Encounter: Payer: Self-pay | Admitting: Internal Medicine

## 2013-12-28 ENCOUNTER — Other Ambulatory Visit: Payer: Self-pay | Admitting: *Deleted

## 2013-12-28 VITALS — BP 118/68 | HR 89 | Temp 97.5°F | Resp 12 | Wt 194.0 lb

## 2013-12-28 DIAGNOSIS — I2510 Atherosclerotic heart disease of native coronary artery without angina pectoris: Secondary | ICD-10-CM | POA: Diagnosis not present

## 2013-12-28 DIAGNOSIS — E1159 Type 2 diabetes mellitus with other circulatory complications: Secondary | ICD-10-CM

## 2013-12-28 DIAGNOSIS — E785 Hyperlipidemia, unspecified: Secondary | ICD-10-CM | POA: Diagnosis not present

## 2013-12-28 MED ORDER — "INSULIN SYRINGE-NEEDLE U-100 31G X 5/16"" 0.5 ML MISC": Status: DC

## 2013-12-28 MED ORDER — INSULIN NPH (HUMAN) (ISOPHANE) 100 UNIT/ML ~~LOC~~ SUSP: SUBCUTANEOUS | Status: DC

## 2013-12-28 MED ORDER — INSULIN REGULAR HUMAN 100 UNIT/ML IJ SOLN: INTRAMUSCULAR | Status: DC

## 2013-12-28 NOTE — Patient Instructions (Signed)
  Please change the doses of insulin as follows:  Insulin Before breakfast Before lunch Before dinner  Regular 10 units before a small meal 14 units before a regular meal 18 units before a larger meal 14 units before a regular meal 18 units before a larger meal 14 units before a regular meal 18 units before a larger meal  NPH 28  20   Continue current sliding scale added to the Regular insulin dose:  - 150- 160: + 1 unit  - 161- 170: + 2 units  - 171- 180: + 3 units  - 181- 190: + 4 units  - >190: + 5 units  Please inject the insulin 30 min before meals.  Please return in 1.5 month with your sugar log.

## 2013-12-28 NOTE — Progress Notes (Addendum)
Patient ID: Monica Lee, female   DOB: 1950-09-14, 63 y.o.   MRN: [id number]  HPI: Monica Lee is a 63 y.o.-year-old woman, returning for f/u for DM2, dx 1980s, insulin-dependent, uncontrolled, with multiple complications (diabetic retinopathy, history of cadaveric kidney transplant in 05/2006 in [place] - now with CKD stage III, gastroparesis, CAD-history of MI in 7782, CHF- diastolic dysfunction, history of TIA and stroke in 2008). Last visit 1.5 mo ago.   Reviewed labs: Received labs from [place] (Dr. Jimmy Footman) from 08/31/2013: - HbA1c 10.6%. Per dr's note, she ran out of Lantus and has not been taking it regularly before running out, either. He gave her samples.   Last hemoglobin A1c was: Lab Results  Component Value Date   HGBA1C 9.9* 09/29/2013   HGBA1C 9.5* 06/01/2013   HGBA1C 10.7* 02/14/2013  Her hemoglobin A1c results are discordant with her very carefully documented sugar logs.   She is on Prednisone 10 mg daily for her kidney transplant.   Pt was on a regimen of: - Lantus 55 >> 45 units (vials) - Novolog 13-13-16 >> 15-15-17 units (vials) - Novolog SSI: target 150, ISF 10  At last visit, we changed to: Insulin Before breakfast Before lunch Before dinner  Regular 18 18 20   NPH 28  20   Continue current sliding scale added to the Regular insulin dose:  - 150- 160: + 1 unit  - 161- 170: + 2 units  - 171- 180: + 3 units  - 181- 190: + 4 units  - >190: + 5 units  Pt checks her sugars 3-4x a day and they are (+ log today): - am: 59-144 >> 79-160 (most sugars 90-130) >> 57-190, mostly 100-130 >> 75-170, 2 values >200 >> 50 x 1, 42-353-614 >> 97-160 >>112-231 >> 103-165, 198, 241, 281 - pre-lunch: 55-174 >> 83-190, mostly low 100s >> 63-168, mostly ~100 >> 88-160s >> 114-174, 252 x1 >> 135 >> 113, 127-213 >> 29, 116-164, 186, 288 - pre-dinner: 77-197>> 73-176, mostly low 100s >> not checking >> 75-150s  >> 113-160, 199 x1 >> 120-130 >> 112, 116-216, 325 >> 111-134 - bedtime:  81-144 >> 64,150,177 >> 101-198, mostly 100-150 >> 90-160 >> 125, 147, 202 >> 150s >> n/c  No more lows in am; lowest 29 x1 when came from [place] on 12/14/2013; she has hypoglycemia awareness at 80. Highest sugar was 240 in last mo (x1) >> 325  - pt has chronic kidney disease, last BUN, creatinine: BUN  Date Value Ref Range Status  12/07/2013 20.5 7.0 - 26.0 mg/dL Final   CREATININE  Date Value Ref Range Status  12/07/2013 1.1 0.6 - 1.1 mg/dL Final  Seen by nephrology.  - Last set of lipids: Lab Results  Component Value Date   CHOL 134 09/29/2013   HDL 35.80* 09/29/2013   LDLCALC 68 09/04/2010   LDLDIRECT 57.7 09/29/2013   TRIG 264.0* 09/29/2013   CHOLHDL 4 09/29/2013   - last eye exam was in 05/12/2013. She had surgeries in the left eye for bleeding. She has cataracts. - Denies numbness and tingling in her legs. She sees podiatry.  She also has a history of hypertension, hyperlipidemia, GERD, DJD, headaches, history of nephrolithiasis, history of anemia, cataracts status post surgery, history of hyperthyroidism. 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 She then fell and developed a hematoma in L leg >> vacuum >> surgery.   I reviewed pt's medications, allergies, PMH, social hx, family hx and  no changes required, except as mentioned above.  ROS: Constitutional: no weight loss, no fatigue, + subjective hyperthermia - sweating at night Eyes: no blurry vision, no xerophthalmia ENT: no sore throat, no nodules palpated in throat, no dysphagia/odynophagia, no hoarseness Cardiovascular: no CP/SOB/palpitations/leg swelling Respiratory: no cough/SOB/wheezing Gastrointestinal: no N/V/D/C Musculoskeletal: no muscle/joint aches Skin: + rash on back  PE: BP 118/68 mmHg  Pulse 89  Temp(Src) 97.5 F (36.4 C) (Oral)  Resp 12  Wt 194 lb (87.998 kg)  SpO2 96% Wt Readings from Last 3 Encounters:  12/28/13 194 lb (87.998 kg)  12/07/13 191 lb 4.8 oz (86.773 kg)   11/16/13 189 lb (85.73 kg)   Constitutional: overweight, in NAD Eyes: surgical left pupil, EOMI, no exophthalmos ENT: moist mucous membranes, no thyromegaly, no cervical lymphadenopathy Cardiovascular: RRR, No MRG Respiratory: CTA B Gastrointestinal: abdomen soft, NT, ND, BS+ Musculoskeletal: no deformities, strength intact in all 4 Skin: moist, warm Neurological: no tremor with outstretched hands, DTR normal in all 4  ASSESSMENT: 1. 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 Jimmy Footman - gastroparesis - CAD-history of MI in 2011, last cardiac cath was in 5456 - CHF-diastolic dysfunction, sees Dr. Verl Blalock - history of TIA and stroke in 2008  2. Papular rash on back  PLAN:  1. Patient with very long-standing diabetes, improved recently especially before dinner, after switching to N and R insulin. She has occasional highs >> misses doses when out. I will give her more flexibility in dosing, since she had a low, at 62 after Church one day. She also tells me she does not bolus if sugars ~130 or lower! I advise her to do a smaller bolus if plans to stay active after a meal or if she eats a smaller meal. Patient Instructions    Please change the doses of insulin as follows:  Insulin Before breakfast Before lunch Before dinner  Regular 10 units before a small meal 14 units before a regular meal 18 units before a larger meal 14 units before a regular meal 18 units before a larger meal 14 units before a regular meal 18 units before a larger meal  NPH 28  20   Continue current sliding scale added to the Regular insulin dose:  - 150- 160: + 1 unit  - 161- 170: + 2 units  - 171- 180: + 3 units  - 181- 190: + 4 units  - >190: + 5 units  Please inject the insulin 30 min before meals.  Please return in 1.5 month with your sugar log.  - will check a hemoglobin A1c  at next visit - she had the flu vaccine at last visit - I will see her back in 1.5 month with her sugar log  - time spent with the patient: 25 min, of which >50% was spent in reviewing her log, discussing changes in her insulin doses and developing a plan to avoid hypo- an hyper-glycemia.

## 2014-01-04 ENCOUNTER — Telehealth: Payer: Self-pay | Admitting: Internal Medicine

## 2014-01-04 MED ORDER — ALBUTEROL SULFATE HFA 108 (90 BASE) MCG/ACT IN AERS: 2.0000 | INHALATION_SPRAY | Freq: Four times a day (QID) | RESPIRATORY_TRACT | Status: AC | PRN

## 2014-01-04 NOTE — Telephone Encounter (Signed)
Refill done.  

## 2014-01-04 NOTE — Telephone Encounter (Signed)
Pt request inhaler to be send to [facility] in [place]. Please advise.

## 2014-01-21 ENCOUNTER — Other Ambulatory Visit: Payer: Self-pay | Admitting: Internal Medicine

## 2014-01-28 ENCOUNTER — Emergency Department (HOSPITAL_COMMUNITY)
Admission: EM | Admit: 2014-01-28 | Discharge: 2014-01-29 | Disposition: A | Discharge status: Home / Self Care | Payer: Medicare Other | Attending: Emergency Medicine | Admitting: Emergency Medicine

## 2014-01-28 ENCOUNTER — Encounter (HOSPITAL_COMMUNITY): Payer: Self-pay | Admitting: Emergency Medicine

## 2014-01-28 ENCOUNTER — Emergency Department (HOSPITAL_COMMUNITY): Payer: Medicare Other

## 2014-01-28 DIAGNOSIS — S8391XA Sprain of unspecified site of right knee, initial encounter: Secondary | ICD-10-CM | POA: Insufficient documentation

## 2014-01-28 DIAGNOSIS — E785 Hyperlipidemia, unspecified: Secondary | ICD-10-CM | POA: Diagnosis not present

## 2014-01-28 DIAGNOSIS — I2510 Atherosclerotic heart disease of native coronary artery without angina pectoris: Secondary | ICD-10-CM | POA: Insufficient documentation

## 2014-01-28 DIAGNOSIS — Z853 Personal history of malignant neoplasm of breast: Secondary | ICD-10-CM | POA: Insufficient documentation

## 2014-01-28 DIAGNOSIS — Z8669 Personal history of other diseases of the nervous system and sense organs: Secondary | ICD-10-CM | POA: Insufficient documentation

## 2014-01-28 DIAGNOSIS — Z8711 Personal history of peptic ulcer disease: Secondary | ICD-10-CM | POA: Diagnosis not present

## 2014-01-28 DIAGNOSIS — M7989 Other specified soft tissue disorders: Secondary | ICD-10-CM | POA: Diagnosis not present

## 2014-01-28 DIAGNOSIS — Z79899 Other long term (current) drug therapy: Secondary | ICD-10-CM | POA: Insufficient documentation

## 2014-01-28 DIAGNOSIS — E039 Hypothyroidism, unspecified: Secondary | ICD-10-CM | POA: Insufficient documentation

## 2014-01-28 DIAGNOSIS — N183 Chronic kidney disease, stage 3 (moderate): Secondary | ICD-10-CM | POA: Insufficient documentation

## 2014-01-28 DIAGNOSIS — J45909 Unspecified asthma, uncomplicated: Secondary | ICD-10-CM | POA: Insufficient documentation

## 2014-01-28 DIAGNOSIS — I252 Old myocardial infarction: Secondary | ICD-10-CM | POA: Diagnosis not present

## 2014-01-28 DIAGNOSIS — Z8673 Personal history of transient ischemic attack (TIA), and cerebral infarction without residual deficits: Secondary | ICD-10-CM | POA: Diagnosis not present

## 2014-01-28 DIAGNOSIS — Z86010 Personal history of colonic polyps: Secondary | ICD-10-CM | POA: Diagnosis not present

## 2014-01-28 DIAGNOSIS — W1839XA Other fall on same level, initial encounter: Secondary | ICD-10-CM | POA: Insufficient documentation

## 2014-01-28 DIAGNOSIS — Y998 Other external cause status: Secondary | ICD-10-CM | POA: Diagnosis not present

## 2014-01-28 DIAGNOSIS — Z7982 Long term (current) use of aspirin: Secondary | ICD-10-CM | POA: Insufficient documentation

## 2014-01-28 DIAGNOSIS — E1143 Type 2 diabetes mellitus with diabetic autonomic (poly)neuropathy: Secondary | ICD-10-CM | POA: Diagnosis not present

## 2014-01-28 DIAGNOSIS — M25572 Pain in left ankle and joints of left foot: Secondary | ICD-10-CM | POA: Diagnosis not present

## 2014-01-28 DIAGNOSIS — I129 Hypertensive chronic kidney disease with stage 1 through stage 4 chronic kidney disease, or unspecified chronic kidney disease: Secondary | ICD-10-CM | POA: Diagnosis not present

## 2014-01-28 DIAGNOSIS — Y9289 Other specified places as the place of occurrence of the external cause: Secondary | ICD-10-CM | POA: Diagnosis not present

## 2014-01-28 DIAGNOSIS — S99912A Unspecified injury of left ankle, initial encounter: Secondary | ICD-10-CM | POA: Diagnosis not present

## 2014-01-28 DIAGNOSIS — K219 Gastro-esophageal reflux disease without esophagitis: Secondary | ICD-10-CM | POA: Insufficient documentation

## 2014-01-28 DIAGNOSIS — S73101A Unspecified sprain of right hip, initial encounter: Secondary | ICD-10-CM | POA: Diagnosis not present

## 2014-01-28 DIAGNOSIS — Y9301 Activity, walking, marching and hiking: Secondary | ICD-10-CM | POA: Diagnosis not present

## 2014-01-28 DIAGNOSIS — S93402A Sprain of unspecified ligament of left ankle, initial encounter: Secondary | ICD-10-CM | POA: Diagnosis not present

## 2014-01-28 DIAGNOSIS — I5032 Chronic diastolic (congestive) heart failure: Secondary | ICD-10-CM | POA: Diagnosis not present

## 2014-01-28 DIAGNOSIS — Z794 Long term (current) use of insulin: Secondary | ICD-10-CM | POA: Insufficient documentation

## 2014-01-28 DIAGNOSIS — M25561 Pain in right knee: Secondary | ICD-10-CM | POA: Diagnosis not present

## 2014-01-28 DIAGNOSIS — Z87442 Personal history of urinary calculi: Secondary | ICD-10-CM | POA: Diagnosis not present

## 2014-01-28 DIAGNOSIS — H5442 Blindness, left eye, normal vision right eye: Secondary | ICD-10-CM | POA: Insufficient documentation

## 2014-01-28 DIAGNOSIS — Z9889 Other specified postprocedural states: Secondary | ICD-10-CM | POA: Insufficient documentation

## 2014-01-28 DIAGNOSIS — Z940 Kidney transplant status: Secondary | ICD-10-CM | POA: Diagnosis not present

## 2014-01-28 DIAGNOSIS — S8991XA Unspecified injury of right lower leg, initial encounter: Secondary | ICD-10-CM | POA: Diagnosis not present

## 2014-01-28 DIAGNOSIS — S79912A Unspecified injury of left hip, initial encounter: Secondary | ICD-10-CM | POA: Diagnosis not present

## 2014-01-28 DIAGNOSIS — W19XXXA Unspecified fall, initial encounter: Secondary | ICD-10-CM

## 2014-01-28 LAB — I-STAT CHEM 8, ED
BUN: 26 mg/dL — ABNORMAL HIGH (ref 6–23)
CHLORIDE: 101 meq/L (ref 96–112)
Calcium, Ion: 1.21 mmol/L (ref 1.13–1.30)
Creatinine, Ser: 1.4 mg/dL — ABNORMAL HIGH (ref 0.50–1.10)
GLUCOSE: 223 mg/dL — AB (ref 70–99)
HEMATOCRIT: 39 % (ref 36.0–46.0)
HEMOGLOBIN: 13.3 g/dL (ref 12.0–15.0)
Potassium: 4.2 mmol/L (ref 3.5–5.1)
Sodium: 142 mmol/L (ref 135–145)
TCO2: 27 mmol/L (ref 0–100)

## 2014-01-28 LAB — CBG MONITORING, ED: Glucose-Capillary: 213 mg/dL — ABNORMAL HIGH (ref 70–99)

## 2014-01-28 MED ORDER — MORPHINE SULFATE 4 MG/ML IJ SOLN
6.0000 mg | Freq: Once | INTRAMUSCULAR | Status: AC
  Administered 2014-01-28: 6 mg via INTRAMUSCULAR
  Filled 2014-01-28: qty 2

## 2014-01-28 NOTE — ED Notes (Signed)
Fell on 13th.  Having pain in right leg and can't bear weight.

## 2014-01-28 NOTE — ED Notes (Signed)
Patient transported to X-ray 

## 2014-01-29 MED ORDER — HYDROCODONE-ACETAMINOPHEN 5-325 MG PO TABS
1.0000 | ORAL_TABLET | ORAL | Status: DC | PRN
Start: 2014-01-29 — End: 2014-04-24

## 2014-01-29 NOTE — ED Provider Notes (Signed)
CSN: [id number]     Arrival date & time 01/28/14  1851 History   First MD Initiated Contact with Patient 01/28/14 2140     Chief Complaint  Patient presents with  . Leg Pain    right leg     (Consider location/radiation/quality/duration/timing/severity/associated sxs/prior Treatment) Patient is a 64 y.o. female presenting with fall and leg pain. The history is provided by the patient. No language interpreter was used.  Fall This is a new problem. Episode onset: 2 days ago. Associated symptoms include joint swelling (left ankle and R knee). Pertinent negatives include no abdominal pain, chest pain, chills, coughing, fever, nausea, numbness or weakness. She has tried acetaminophen and NSAIDs for the symptoms. The treatment provided no relief.  Leg Pain Location:  Leg, knee and ankle Injury: yes   Mechanism of injury: fall   Fall:    Fall occurred:  Walking   Height of fall:  2 feet   Impact surface:  Concrete   Point of impact: R thigh.   Entrapped after fall: no   Leg location:  R upper leg Knee location:  R knee Ankle location:  L ankle Pain details:    Quality:  Aching   Radiates to:  Does not radiate   Severity:  Severe   Onset quality:  Gradual   Duration:  2 days   Timing:  Constant   Progression:  Worsening Chronicity:  New Dislocation: no   Foreign body present:  No foreign bodies Tetanus status:  Up to date Prior injury to area:  No Relieved by:  Nothing Worsened by:  Bearing weight Ineffective treatments:  NSAIDs and acetaminophen Associated symptoms: swelling (R knee)   Associated symptoms: no back pain, no fever and no itching   Risk factors: no concern for non-accidental trauma, no frequent fractures and no known bone disorder     Past Medical History  Diagnosis Date  . S/P kidney transplant     CADAVERIC --  05/2006  . Hyperlipidemia   . DJD (degenerative joint disease)   . Hx of colonic polyps   . Diabetic retinopathy     RIGHT EYE  . Hypertension    . Asthma   . Blind left eye     OCCLUDED CORNEA,  FUNDI--  FAILED SURGERY REPAIR 2003  . Chronic diastolic CHF (congestive heart failure)     a. Cardiac MRI (10/2012): Moderate to severe LVH, EF 55%, chordal SAM but no LVOT gradient, mod circumferential effusion, mild LAE.  b.  Echocardiogram (11/06/12): Severe LVH, EF 85-88%, grade 1 diastolic dysfunction, moderate effusion.  . Wears glasses   . History of myocardial infarction     2011--  S/P CARDIAC CATH ([place])  NON-OBSTRUCTIVE CAD  . GERD (gastroesophageal reflux disease)   . History of kidney stones   . History of CVA (cerebrovascular accident) without residual deficits     2008  AND TIA IN 2008  W/ NO RESIDUAL  . History of peptic ulcer     2011--  RESOLVED  . 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 Gastroparesis due to DM   . Chronic kidney disease (CKD), stage III (moderate)     NEPHROLOGIST--  DR Freddrick March  . Open thigh wound     ANTERIOR  . Coronary artery disease CARDIOLOGIST--  DR Johnsie Cancel    NON-OBSTRUCTIVE CAD  PER CARDIAC CATH  2011  . Pericardial effusion without cardiac tamponade     PER DR NISHAN  NOTE --  NOT MALIGNANT  ? RELATED TO HYPOTHYROIDISM  . Thyromegaly     MULTINODULER GOITER  . Hyperparathyroidism, secondary renal   . Anemia in chronic kidney disease   . Trigeminal neuralgia   . Hypothyroidism   . At risk for sleep apnea     STOP-BANG= 4   SENT TO PCP  03-25-2013   Past Surgical History  Procedure Laterality Date  . Transplantation renal  05/2006    CADAVERIC  . Breast biopsy  2010  . Cholecystectomy  2008  . Hemorrhoid surgery  01/23/2012    Procedure: HEMORRHOIDECTOMY PROLAPSED;  Surgeon: Leighton Ruff, MD;  Location: [facility];  Service: General;  Laterality: N/A;  . Foot surgery Right 2013  . Breast lumpectomy with  sentinel lymph node biopsy Right 11/09/2012    Procedure: RIGHT BREAST LUMPECTOMY WITH SENTINEL LYMPH NODE REMOVAL;  Surgeon: Haywood Lasso, MD;  Location: [place];  Service: General;  Laterality: Right;  . Portacath placement Right 11/30/2012    Procedure: INSERTION PORT-A-CATH;  Surgeon: Haywood Lasso, MD;  Location: [place];  Service: General;  Laterality: Right;  . Irrigation and debridement abscess Left 02/14/2013    Procedure: IRRIGATION AND DEBRIDEMENT LEFT THIGH ABSCESS;  Surgeon: Joyice Faster. Cornett, MD;  Location: [place];  Service: General;  Laterality: Left;  . Incision and drainage of wound Left 02/18/2013    Procedure: IRRIGATION AND DEBRIDEMENT THIGH WOUND;  Surgeon: Rolm Bookbinder, MD;  Location: [place];  Service: General;  Laterality: Left;  . Cataract extraction w/ intraocular lens  implant, bilateral    . Repair  intestinal perforation surgery  01-31-2009  . Cardiac catheterization  04-12-2009  DR EVELYNE GOUDREAU ([facility] IN [place], [place])    NON-OBSTRUCTIVE CAD/  mLAD 40%,  oLAD 50%,  dCFX 40%,  OM1  40%,  mRCA  &  dRCA  50%/  LVEF 65% /  ELEVATED  LVEDP  . Transthoracic echocardiogram  01-19-2013  DR NISHAN    SEVERE LVH/  EF 16-10%/  GRADE I DIASTOLIC DYSFUNCTION/  MODERATE LAE/  MILD IMPROVEMENT OF MODERATE CIRCUMFERENTIAL INFERIOROLATERAL PERICARDIAL EFFUSION WITH NO TAMPONADE  . Cardiovascular stress test  11-04-2012  DR NISHAN    HIGH RISK NUCLEAR STUDY/  FIXED DEFECT AFFECTING ENTIRE INFEROLATERAL AND ANTEROLATERAL WALL/  MODERATE ISCHEMIA  AT MID/ APICAL ANTEROR AND INFERIOR WALL/  GLOBAL HYPOKINESIS/  LVEF 28% (FELT TO BE FALSE +,  ECHO & cMRI  NORMAL EF AND NORMAL WM)  . Vaginal hysterectomy  1990's    W/  BILATERAL SALPINGOOPHORECTOMY  . Eye surgery Left 2003    REPAIR CORNEA  . Skin split graft Left 03/29/2013    Procedure: SKIN GRAFT SPLIT THICKNESS WITH A-CELL AND VAC TO LEFT THIGH;  Surgeon: Theodoro Kos, DO;  Location: [employer];  Service: Plastics;  Laterality: Left;   Family History  Problem Relation Age of Onset  . Kidney disease Neg Hx   . Arthritis Other     parent  . Heart disease Other     parent  . Hypertension Other     parent  . Diabetes Other     parent  . Diabetes Other     grandparent  . Thyroid disease Other     several  . Heart disease Father  History  Substance Use Topics  . Smoking status: Never Smoker   . Smokeless tobacco: Never Used  . Alcohol Use: No   OB History    No data available     Review of Systems  Constitutional: Negative for fever and chills.  Respiratory: Negative for cough.   Cardiovascular: Negative for chest pain.  Gastrointestinal: Negative for nausea and abdominal pain.  Genitourinary: Negative for dysuria, urgency and frequency.  Musculoskeletal: Positive for joint swelling (left ankle and R knee). Negative for back pain.  Skin: Negative for color change, itching and wound.  Neurological: Negative for weakness and numbness.  All other systems reviewed and are negative.     Allergies  Keflex and Propoxyphene n-acetaminophen  Home Medications   Prior to Admission medications   Medication Sig Start Date End Date Taking? Authorizing Provider  albuterol (ACCUNEB) 0.63 MG/3ML nebulizer solution Take 1 ampule by nebulization every 6 (six) hours as needed for wheezing.   Yes Historical Provider, MD  albuterol (PROVENTIL HFA;VENTOLIN HFA) 108 (90 BASE) MCG/ACT inhaler Inhale 2 puffs into the lungs every 6 (six) hours as needed for wheezing or shortness of breath. 01/04/14  Yes Biagio Borg, MD  amLODipine (NORVASC) 10 MG tablet TAKE 1 TABLET BY MOUTH EVERY DAY 10/18/13  Yes Biagio Borg, MD  aspirin EC 81 MG tablet Take 81 mg by mouth daily.   Yes Historical Provider, MD  azaTHIOprine (IMURAN) 50 MG tablet Take 75 mg by mouth daily.    Yes Historical Provider, MD  brimonidine (ALPHAGAN) 0.2 % ophthalmic solution Place 1 drop into the right eye 2  (two) times daily.  03/10/13  Yes Historical Provider, MD  cinacalcet (SENSIPAR) 30 MG tablet Take 30 mg by mouth every other day.  04/09/12  Yes Biagio Borg, MD  Docusate Sodium POWD 1 Package by Does not apply route daily. Puts in coffee   Yes Historical Provider, MD  feeding supplement, GLUCERNA SHAKE, (GLUCERNA SHAKE) LIQD Take 237 mLs by mouth 2 (two) times daily between meals. 02/23/13  Yes Hosie Poisson, MD  furosemide (LASIX) 80 MG tablet Take 160 mg by mouth 2 (two) times daily.    Yes Historical Provider, MD  insulin NPH Human (HUMULIN N,NOVOLIN N) 100 UNIT/ML injection Inject 48 units under the skin as advised 12/28/13  Yes Philemon Kingdom, MD  insulin regular (NOVOLIN R,HUMULIN R) 100 units/mL injection Inject up to 60 units under the skin as advised 12/28/13  Yes Philemon Kingdom, MD  isosorbide mononitrate (IMDUR) 60 MG 24 hr tablet TAKE 1 TABLET BY MOUTH EVERY DAY 01/21/14  Yes Biagio Borg, MD  KLOR-CON M20 20 MEQ tablet Takes 1/2 tab in morning and 1/2 at night. 02/11/13  Yes Historical Provider, MD  levothyroxine (SYNTHROID, LEVOTHROID) 75 MCG tablet Take 1 tablet (75 mcg total) by mouth daily. 03/25/13  Yes Biagio Borg, MD  magnesium oxide (MAG-OX) 400 MG tablet Take 400 mg by mouth 2 (two) times daily.   Yes Historical Provider, MD  metoprolol (LOPRESSOR) 100 MG tablet TAKE 1 TABLET BY MOUTH TWICE A DAY 10/28/13  Yes Josue Hector, MD  minoxidil (LONITEN) 2.5 MG tablet Take 2.5 mg by mouth 2 (two) times daily.   Yes Historical Provider, MD  nitroGLYCERIN (NITROSTAT) 0.4 MG SL tablet Place 0.4 mg under the tongue every 5 (five) minutes as needed for chest pain.   Yes Renella Cunas, MD  polyethylene glycol (MIRALAX / GLYCOLAX) packet Take 17 g by mouth daily.  09/29/13  Yes Historical Provider, MD  pravastatin (PRAVACHOL) 20 MG tablet Take 10 mg by mouth at bedtime.     Yes Historical Provider, MD  prednisoLONE acetate (PRED FORTE) 1 % ophthalmic suspension Place 1 drop into the left eye  2 (two) times daily.    Yes Historical Provider, MD  predniSONE (DELTASONE) 10 MG tablet Take 10 mg by mouth every morning.    Yes Historical Provider, MD  tacrolimus (PROGRAF) 1 MG capsule Take 9 mg by mouth 2 (two) times daily.    Yes Historical Provider, MD  timolol (TIMOPTIC) 0.25 % ophthalmic solution Place 1 drop into the right eye 2 (two) times daily.   Yes Historical Provider, MD  HYDROcodone-acetaminophen (NORCO/VICODIN) 5-325 MG per tablet Take 1 tablet by mouth every 4 (four) hours as needed. 01/29/14   Katheren Shams, MD   BP 158/63 mmHg  Pulse 70  Temp(Src) 98.1 F (36.7 C) (Oral)  Resp 16  Ht 5\' 5"  (1.651 m)  Wt 195 lb (88.451 kg)  BMI 32.45 kg/m2  SpO2 92% Physical Exam  Constitutional: She is oriented to person, place, and time. She appears well-developed and well-nourished. No distress.  HENT:  Head: Normocephalic and atraumatic.  Eyes: Pupils are equal, round, and reactive to light.  Neck: Normal range of motion.  Cardiovascular: Normal rate, regular rhythm, normal heart sounds and intact distal pulses.   Pulmonary/Chest: Effort normal. No respiratory distress. She has no wheezes. She exhibits no tenderness.  Abdominal: Soft. Bowel sounds are normal. She exhibits no distension. There is no tenderness. There is no rebound and no guarding.  Musculoskeletal:       Right knee: She exhibits swelling. She exhibits normal range of motion, no effusion, no ecchymosis, no deformity and no erythema. No tenderness found.       Right ankle: She exhibits normal range of motion, no swelling, no ecchymosis, no deformity, no laceration and normal pulse. No tenderness.       Left ankle: She exhibits decreased range of motion and swelling. She exhibits no deformity and no laceration. Tenderness.       Right upper leg: She exhibits tenderness. She exhibits no bony tenderness, no swelling, no edema and no deformity.       Right lower leg: She exhibits no tenderness, no bony tenderness, no  swelling, no edema and no deformity.       Right foot: There is normal range of motion, no tenderness, no bony tenderness, no swelling, no crepitus and no deformity.  Neurological: She is alert and oriented to person, place, and time. She has normal strength. No cranial nerve deficit or sensory deficit. She exhibits normal muscle tone. Coordination and gait normal.  Skin: Skin is warm and dry.  Nursing note and vitals reviewed.   ED Course  Procedures (including critical care time) Labs Review Labs Reviewed  CBG MONITORING, ED - Abnormal; Notable for the following:    Glucose-Capillary 213 (*)    All other components within normal limits  I-STAT CHEM 8, ED - Abnormal; Notable for the following:    BUN 26 (*)    Creatinine, Ser 1.40 (*)    Glucose, Bld 223 (*)    All other components within normal limits    Imaging Review Dg Knee 2 Views Right  01/28/2014   CLINICAL DATA:  Severe leg cramp while climbing stairs. Status post fall, with right knee pain. Initial encounter.  EXAM: RIGHT KNEE - 1-2 VIEW  COMPARISON:  None.  FINDINGS:  There is no evidence of fracture or dislocation. The joint spaces are preserved. No significant degenerative change is seen; the patellofemoral joint is grossly unremarkable in appearance. A fabella is noted. An enthesophyte is seen arising at the upper pole of the patella.  No significant joint effusion is seen. The visualized soft tissues are normal in appearance. Diffuse vascular calcifications are seen.  IMPRESSION: 1. No evidence of fracture or dislocation. 2. Diffuse vascular calcifications seen.   Electronically Signed   By: Garald Balding M.D.   On: 01/28/2014 23:43   Dg Ankle Complete Left  01/28/2014   CLINICAL DATA:  Acute onset of severe leg cramp, and fall, with left ankle pain. Initial encounter.  EXAM: LEFT ANKLE COMPLETE - 3+ VIEW  COMPARISON:  None.  FINDINGS: There is no evidence of fracture or dislocation. The ankle mortise is intact; the  interosseous space is within normal limits. No talar tilt or subluxation is seen.  The joint spaces are preserved. Diffuse vascular calcifications are seen. Soft tissue swelling is noted overlying the lateral malleolus.  IMPRESSION: 1. No evidence of fracture or dislocation. 2. Diffuse vascular calcifications seen. 3. Soft tissue swelling overlying the lateral malleolus.   Electronically Signed   By: Garald Balding M.D.   On: 01/28/2014 23:37   Dg Hip Unilat With Pelvis 2-3 Views Right  01/28/2014   CLINICAL DATA:  Severe leg cramp while climbing stairs; injury to right hip and thigh. Initial encounter.  EXAM: DG HIP W/ PELVIS 2-3V*R*  COMPARISON:  None.  FINDINGS: There is no evidence of fracture or dislocation. Both femoral heads are seated normally within their respective acetabula. The proximal right femur appears intact. No significant degenerative change is appreciated. The sacroiliac joints are unremarkable in appearance.  The visualized bowel gas pattern is grossly unremarkable in appearance. Scattered phleboliths are noted within the pelvis. Diffuse vascular calcifications are seen. Scattered clips are noted at the right hemipelvis.  IMPRESSION: 1. No evidence of fracture or dislocation. 2. Diffuse vascular calcifications seen.   Electronically Signed   By: Garald Balding M.D.   On: 01/28/2014 23:42     EKG Interpretation None      MDM   Final diagnoses:  Fall  Hip sprain, right, initial encounter  Right knee sprain, initial encounter  Left ankle sprain, initial encounter     Patient is a 64 year old African-American female with pertinent past medical history of necrotizing fasciitis of the left thigh who comes to the emergency department today after a fall 2 days ago with continued pain to the right thigh, behind the right knee, and the left ankle. Physical exam as above. With no erythema, warmth, or fevers I doubt necrotizing fasciitis or cellulitis at this time. Initial workup  included an x-ray of the right knee, right hip, pelvis, and left ankle. These demonstrated no fractures or dislocations. Patient was treated with 6 mg of morphine with complete relief of her pain. She was ambulated in the emergency department with no difficulty for approximately 5 steps. As a result I doubt an occult hip fracture. I stat chem-8 demonstrated a creatinine of 1.4. The patient has been using Motrin for pain. She was instructed to stop utilizing Motrin as this can cause kidney injury. CBG was 213 otherwise unremarkable. Patient has no lower extremity edema, her pain is isolated to the right thigh and behind the right knee, she has never had a DVT as result I feel DVT is unlikely this time. Patient was very concerned that  she could have a DVT. The possibility of DVT was discussed with the patient. She was informed that I feel she has a low likelihood at this time and that her pain is likely from her fall. She was informed that occasionally the only symptom of a DVT can be pain so that I cannot know for certain with history and physical exam alone that she does not have a DVT though her localized pain after a fall are not consistent with this. Patient was offered an ultrasound at this time versus ultrasound with follow-up with her primary care physician should she still continue to have pain and not have resolution of her pain as expected after the fall. This was also discussed with the patient's 2 daughters. The patient and her daughters opted for outpatient follow-up. They were instructed to return to the emergency department should she develop worsening pain, lower extremity edema, or any other concerns. Patient and her daughter expressed understanding. She was discharged in good condition. Labs and imaging were reviewed by myself and considered in medical decision making. Imaging was interpreted by radiology.  Care was discussed with my attending Dr. Maryan Rued.   Katheren Shams, MD 01/29/14  [place], MD 01/29/14 [phone]

## 2014-01-29 NOTE — Discharge Instructions (Signed)
Hip Pain Your hip is the joint between your upper legs and your lower pelvis. The bones, cartilage, tendons, and muscles of your hip joint perform a lot of work each day supporting your body weight and allowing you to move around. Hip pain can range from a minor ache to severe pain in one or both of your hips. Pain may be felt on the inside of the hip joint near the groin, or the outside near the buttocks and upper thigh. You may have swelling or stiffness as well.  HOME CARE INSTRUCTIONS   Take medicines only as directed by your health care provider.  Apply ice to the injured area:  Put ice in a plastic bag.  Place a towel between your skin and the bag.  Leave the ice on for 15-20 minutes at a time, 3-4 times a day.  Keep your leg raised (elevated) when possible to lessen swelling.  Avoid activities that cause pain.  Follow specific exercises as directed by your health care provider.  Sleep with a pillow between your legs on your most comfortable side.  Record how often you have hip pain, the location of the pain, and what it feels like. SEEK MEDICAL CARE IF:   You are unable to put weight on your leg.  Your hip is red or swollen or very tender to touch.  Your pain or swelling continues or worsens after 1 week.  You have increasing difficulty walking.  You have a fever. SEEK IMMEDIATE MEDICAL CARE IF:   You have fallen.  You have a sudden increase in pain and swelling in your hip. MAKE SURE YOU:   Understand these instructions.  Will watch your condition.  Will get help right away if you are not doing well or get worse. Document Released: 06/20/2009 Document Revised: 05/17/2013 Document Reviewed: 08/27/2012 [employer] Patient Information 2015 [employer]. This information is not intended to replace advice given to you by your health care provider. Make sure you discuss any questions you have with your health care provider.  

## 2014-01-31 ENCOUNTER — Other Ambulatory Visit: Payer: Self-pay | Admitting: Cardiovascular Disease

## 2014-01-31 ENCOUNTER — Other Ambulatory Visit: Payer: Self-pay | Admitting: Internal Medicine

## 2014-01-31 DIAGNOSIS — H44522 Atrophy of globe, left eye: Secondary | ICD-10-CM | POA: Diagnosis not present

## 2014-01-31 DIAGNOSIS — E11359 Type 2 diabetes mellitus with proliferative diabetic retinopathy without macular edema: Secondary | ICD-10-CM | POA: Diagnosis not present

## 2014-01-31 DIAGNOSIS — E1139 Type 2 diabetes mellitus with other diabetic ophthalmic complication: Secondary | ICD-10-CM | POA: Diagnosis not present

## 2014-01-31 DIAGNOSIS — H35371 Puckering of macula, right eye: Secondary | ICD-10-CM | POA: Diagnosis not present

## 2014-02-03 ENCOUNTER — Telehealth: Payer: Self-pay | Admitting: Oncology

## 2014-02-07 ENCOUNTER — Telehealth: Payer: Self-pay | Admitting: *Deleted

## 2014-02-07 NOTE — Telephone Encounter (Signed)
On 02-07-14 fax medical records to [facility] it was consult note.

## 2014-02-08 ENCOUNTER — Telehealth: Payer: Self-pay | Admitting: Internal Medicine

## 2014-02-08 ENCOUNTER — Ambulatory Visit: Payer: Medicare Other | Admitting: Internal Medicine

## 2014-02-21 DIAGNOSIS — E118 Type 2 diabetes mellitus with unspecified complications: Secondary | ICD-10-CM | POA: Diagnosis not present

## 2014-02-21 DIAGNOSIS — I129 Hypertensive chronic kidney disease with stage 1 through stage 4 chronic kidney disease, or unspecified chronic kidney disease: Secondary | ICD-10-CM | POA: Diagnosis not present

## 2014-02-21 DIAGNOSIS — N183 Chronic kidney disease, stage 3 (moderate): Secondary | ICD-10-CM | POA: Diagnosis not present

## 2014-02-21 DIAGNOSIS — E782 Mixed hyperlipidemia: Secondary | ICD-10-CM | POA: Diagnosis not present

## 2014-02-21 DIAGNOSIS — M1A20X1 Drug-induced chronic gout, unspecified site, with tophus (tophi): Secondary | ICD-10-CM | POA: Diagnosis not present

## 2014-02-21 DIAGNOSIS — Z940 Kidney transplant status: Secondary | ICD-10-CM | POA: Diagnosis not present

## 2014-03-04 ENCOUNTER — Other Ambulatory Visit: Payer: Self-pay | Admitting: *Deleted

## 2014-03-04 DIAGNOSIS — C50511 Malignant neoplasm of lower-outer quadrant of right female breast: Secondary | ICD-10-CM

## 2014-03-04 NOTE — Telephone Encounter (Signed)
none

## 2014-03-07 ENCOUNTER — Telehealth: Payer: Self-pay | Admitting: Oncology

## 2014-03-07 ENCOUNTER — Other Ambulatory Visit (HOSPITAL_BASED_OUTPATIENT_CLINIC_OR_DEPARTMENT_OTHER): Payer: Medicare Other

## 2014-03-07 ENCOUNTER — Ambulatory Visit (HOSPITAL_BASED_OUTPATIENT_CLINIC_OR_DEPARTMENT_OTHER): Payer: Medicare Other | Admitting: Oncology

## 2014-03-07 VITALS — BP 134/65 | HR 72 | Temp 98.3°F | Resp 18 | Ht 65.0 in | Wt 196.9 lb

## 2014-03-07 DIAGNOSIS — N183 Chronic kidney disease, stage 3 (moderate): Secondary | ICD-10-CM

## 2014-03-07 DIAGNOSIS — Z853 Personal history of malignant neoplasm of breast: Secondary | ICD-10-CM

## 2014-03-07 DIAGNOSIS — N119 Chronic tubulo-interstitial nephritis, unspecified: Secondary | ICD-10-CM

## 2014-03-07 DIAGNOSIS — I2510 Atherosclerotic heart disease of native coronary artery without angina pectoris: Secondary | ICD-10-CM | POA: Diagnosis not present

## 2014-03-07 DIAGNOSIS — E1159 Type 2 diabetes mellitus with other circulatory complications: Secondary | ICD-10-CM

## 2014-03-07 DIAGNOSIS — C50511 Malignant neoplasm of lower-outer quadrant of right female breast: Secondary | ICD-10-CM

## 2014-03-07 LAB — CBC WITH DIFFERENTIAL/PLATELET
BASO%: 0.7 % (ref 0.0–2.0)
Basophils Absolute: 0.1 10*3/uL (ref 0.0–0.1)
EOS%: 1.1 % (ref 0.0–7.0)
Eosinophils Absolute: 0.1 10*3/uL (ref 0.0–0.5)
HCT: 39.4 % (ref 34.8–46.6)
HGB: 12.5 g/dL (ref 11.6–15.9)
LYMPH%: 8.4 % — ABNORMAL LOW (ref 14.0–49.7)
MCH: 28.1 pg (ref 25.1–34.0)
MCHC: 31.7 g/dL (ref 31.5–36.0)
MCV: 88.4 fL (ref 79.5–101.0)
MONO#: 0.7 10*3/uL (ref 0.1–0.9)
MONO%: 7.7 % (ref 0.0–14.0)
NEUT#: 7.6 10*3/uL — ABNORMAL HIGH (ref 1.5–6.5)
NEUT%: 82.1 % — ABNORMAL HIGH (ref 38.4–76.8)
Platelets: 222 10*3/uL (ref 145–400)
RBC: 4.46 10*6/uL (ref 3.70–5.45)
RDW: 16.6 % — ABNORMAL HIGH (ref 11.2–14.5)
WBC: 9.2 10*3/uL (ref 3.9–10.3)
lymph#: 0.8 10*3/uL — ABNORMAL LOW (ref 0.9–3.3)

## 2014-03-07 LAB — COMPREHENSIVE METABOLIC PANEL (CC13)
ALT: 17 U/L (ref 0–55)
AST: 22 U/L (ref 5–34)
Albumin: 3.7 g/dL (ref 3.5–5.0)
Alkaline Phosphatase: 60 U/L (ref 40–150)
Anion Gap: 9 mEq/L (ref 3–11)
BUN: 21.5 mg/dL (ref 7.0–26.0)
CO2: 31 meq/L — AB (ref 22–29)
Calcium: 9.6 mg/dL (ref 8.4–10.4)
Chloride: 106 mEq/L (ref 98–109)
Creatinine: 1.3 mg/dL — ABNORMAL HIGH (ref 0.6–1.1)
EGFR: 52 mL/min/{1.73_m2} — ABNORMAL LOW (ref >90)
GLUCOSE: 154 mg/dL — AB (ref 70–140)
Potassium: 3.9 mEq/L (ref 3.5–5.1)
SODIUM: 145 meq/L (ref 136–145)
Total Bilirubin: 0.28 mg/dL (ref 0.20–1.20)
Total Protein: 6.8 g/dL (ref 6.4–8.3)

## 2014-03-07 NOTE — Progress Notes (Signed)
Monica Lee  [phone] [phone]) [phone]     ID: [name] OB: 21-Oct-1950  MR#: [id number]  RCV#:[id number]  PCP: [name], MD GYN:   SUGerald Stabs Streck] OTHER MD: Theodoro Kos, Arloa Koh, [place]  CHIEF COMPLAINT: Triple negative breast cancer  CURRENT TREATMENT: Observation   BREAST CANCER HISTORY: From the original intake note:  The patient herself palpated a mass in her right breast. She brought it to medical attention at the time of her routinely scheduled mammogram 10/08/2012. This showed an irregular mass in the right breast lower outer quadrant which by ultrasound measured 2.1 cm. Biopsy of the mass was performed the same day, and showed ([id number]) invasive ductal carcinoma, grade 3, triple negative, with an MIB-1 of 56%. She proceeded to right lumpectomy and sentinel lymph node sampling 11/09/2012 with the pathology(SZA 14-04/07/2001 showing invasive ductal carcinoma, grade 3, measuring 2.2 cm, with negative margins, and all 3 sentinel lymph nodes benign. Repeat prognostic panel was again triple negative. The HER-2 signals ratio was 1.38 and the number per cell 2.2.  Her subsequent history is as detailed below  INTERVAL HISTORY: Jonel returns today for follow-up of her triple negative breast cancer. The interval history is significant for a fall in January. She injured her left knee and ankle. She says because of that she has not been able to get on her exercise bike for the last 2 weeks at least. When she does get on at the maximum she can do is 20 minutes. She did have her mammography in December and they'll is unremarkable  REVIEW OF SYSTEMS: A detailed review of systems today was otherwise stable  PAST MEDICAL HISTORY: Past Medical History  Diagnosis Date  . S/P kidney transplant     CADAVERIC --  05/2006  . Hyperlipidemia   . DJD (degenerative joint disease)   . Hx of colonic polyps   . Diabetic retinopathy    RIGHT EYE  . Hypertension   . Asthma   . Blind left eye     OCCLUDED CORNEA,  FUNDI--  FAILED SURGERY REPAIR 2003  . Chronic diastolic CHF (congestive heart failure)     a. Cardiac MRI (10/2012): Moderate to severe LVH, EF 55%, chordal SAM but no LVOT gradient, mod circumferential effusion, mild LAE.  b.  Echocardiogram (11/06/12): Severe LVH, EF 77-82%, grade 1 diastolic dysfunction, moderate effusion.  . Wears glasses   . History of myocardial infarction     2011--  S/P CARDIAC CATH ([place])  NON-OBSTRUCTIVE CAD  . GERD (gastroesophageal reflux disease)   . History of kidney stones   . History of CVA (cerebrovascular accident) without residual deficits     2008  AND TIA IN 2008  W/ NO RESIDUAL  . History of peptic ulcer     2011--  RESOLVED  . 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 Gastroparesis due to DM   . Chronic kidney disease (CKD), stage III (moderate)     NEPHROLOGIST--  DR Freddrick March  . Open thigh wound     ANTERIOR  . Coronary artery disease CARDIOLOGIST--  DR Johnsie Cancel    NON-OBSTRUCTIVE CAD  PER CARDIAC CATH  2011  . Pericardial effusion without cardiac tamponade     PER DR NISHAN  NOTE --  NOT  MALIGNANT  ? RELATED TO HYPOTHYROIDISM  . Thyromegaly     MULTINODULER GOITER  . Hyperparathyroidism, secondary renal   . Anemia in chronic kidney disease   . Trigeminal neuralgia   . Hypothyroidism   . At risk for sleep apnea     STOP-BANG= 4   SENT TO PCP  03-25-2013    PAST SURGICAL HISTORY: Past Surgical History  Procedure Laterality Date  . Transplantation renal  05/2006    CADAVERIC  . Breast biopsy  2010  . Cholecystectomy  2008  . Hemorrhoid surgery  01/23/2012    Procedure: HEMORRHOIDECTOMY PROLAPSED;  Surgeon: Leighton Ruff, MD;  [facility];  Service: General;  Laterality: N/A;  . Foot  surgery Right 2013  . Breast lumpectomy with sentinel lymph node biopsy Right 11/09/2012    Procedure: RIGHT BREAST LUMPECTOMY WITH SENTINEL LYMPH NODE REMOVAL;  Surgeon: Haywood Lasso, MD;  Location: [place];  Service: General;  Laterality: Right;  . Portacath placement Right 11/30/2012    Procedure: INSERTION PORT-A-CATH;  Surgeon: Haywood Lasso, MD;  Location: [place];  Service: General;  Laterality: Right;  . Irrigation and debridement abscess Left 02/14/2013    Procedure: IRRIGATION AND DEBRIDEMENT LEFT THIGH ABSCESS;  Surgeon: Joyice Faster. Cornett, MD;  Location: [place];  Service: General;  Laterality: Left;  . Incision and drainage of wound Left 02/18/2013    Procedure: IRRIGATION AND DEBRIDEMENT THIGH WOUND;  Surgeon: Rolm Bookbinder, MD;  Location: [place];  Service: General;  Laterality: Left;  . Cataract extraction w/ intraocular lens  implant, bilateral    . Repair  intestinal perforation surgery  01-31-2009  . Cardiac catheterization  04-12-2009  DR EVELYNE GOUDREAU ([facility] IN [place], [place])    NON-OBSTRUCTIVE CAD/  mLAD 40%,  oLAD 50%,  dCFX 40%,  OM1  40%,  mRCA  &  dRCA  50%/  LVEF 65% /  ELEVATED  LVEDP  . Transthoracic echocardiogram  01-19-2013  DR NISHAN    SEVERE LVH/  EF 73-22%/  GRADE I DIASTOLIC DYSFUNCTION/  MODERATE LAE/  MILD IMPROVEMENT OF MODERATE CIRCUMFERENTIAL INFERIOROLATERAL PERICARDIAL EFFUSION WITH NO TAMPONADE  . Cardiovascular stress test  11-04-2012  DR NISHAN    HIGH RISK NUCLEAR STUDY/  FIXED DEFECT AFFECTING ENTIRE INFEROLATERAL AND ANTEROLATERAL WALL/  MODERATE ISCHEMIA  AT MID/ APICAL ANTEROR AND INFERIOR WALL/  GLOBAL HYPOKINESIS/  LVEF 28% (FELT TO BE FALSE +,  ECHO & cMRI  NORMAL EF AND NORMAL WM)  . Vaginal hysterectomy  1990's    W/  BILATERAL SALPINGOOPHORECTOMY  . Eye surgery Left 2003    REPAIR CORNEA  . Skin split graft Left 03/29/2013    Procedure: SKIN GRAFT SPLIT THICKNESS WITH A-CELL AND VAC TO LEFT THIGH;  Surgeon: Theodoro Kos, DO;  Location: [place];  Service: Plastics;  Laterality: Left;    FAMILY HISTORY Family History  Problem Relation Age of Onset  . Kidney disease Neg Hx   . Arthritis Other     parent  . Heart disease Other     parent  . Hypertension Other     parent  . Diabetes Other     parent  . Diabetes Other     grandparent  . Thyroid disease Other     several  . Heart disease Father   The patient's father died at the age of 34 from a myocardial infarction. The patient's mother died in childbirth. The patient had 2 brothers, one  sister. There is no history of breast or ovarian cancer in the family to her knowledge.  GYNECOLOGIC HISTORY:  Menarche age 46, first live birth age 23. The patient is GX P4. Gender 1 hysterectomy without salpingo-oophorectomy in the early 1990s. She did not take hormone replacement.   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 Her husband Wille Glaser used to work in Architect. He is also retired. It's just the 2 of them at home. She has 4 grandchildren. She attends a local [employer] [employer] [place]: Not in place   HEALTH MAINTENANCE: History  Substance Use Topics  . Smoking status: Never Smoker   . Smokeless tobacco: Never Used  . Alcohol Use: No    Mammo: 09/2012  Colonoscopy:  2013, Dr. Donna Christen, unsure of f/u recommendation  Bone density: unsure  Lipid panel: 2015  Allergies  Allergen Reactions  . Keflex [Cephalexin] Swelling    Swelling to face, chills Tolerates zosyn 02/14/13  . Propoxyphene N-Acetaminophen Hives and Itching    Patient can tolerate acetaminophen solely    Current Outpatient Prescriptions  Medication Sig Dispense Refill  . albuterol (ACCUNEB) 0.63 MG/3ML nebulizer solution Take 1 ampule by nebulization every 6 (six) hours as needed for wheezing.    Marland Kitchen albuterol (PROVENTIL HFA;VENTOLIN HFA) 108 (90 BASE) MCG/ACT inhaler Inhale 2 puffs into the lungs every 6 (six) hours as needed  for wheezing or shortness of breath. 1 Inhaler 11  . amLODipine (NORVASC) 10 MG tablet TAKE 1 TABLET BY MOUTH EVERY DAY 90 tablet 1  . aspirin EC 81 MG tablet Take 81 mg by mouth daily.    Marland Kitchen azaTHIOprine (IMURAN) 50 MG tablet Take 75 mg by mouth daily.     . brimonidine (ALPHAGAN) 0.2 % ophthalmic solution Place 1 drop into the right eye 2 (two) times daily.     . cinacalcet (SENSIPAR) 30 MG tablet Take 30 mg by mouth every other day.     Mariane Baumgarten Sodium POWD 1 Package by Does not apply route daily. Puts in coffee    . feeding supplement, GLUCERNA SHAKE, (GLUCERNA SHAKE) LIQD Take 237 mLs by mouth 2 (two) times daily between meals.  0  . furosemide (LASIX) 80 MG tablet Take 160 mg by mouth 2 (two) times daily.     Marland Kitchen HYDROcodone-acetaminophen (NORCO/VICODIN) 5-325 MG per tablet Take 1 tablet by mouth every 4 (four) hours as needed. 15 tablet 0  . insulin NPH Human (HUMULIN N,NOVOLIN N) 100 UNIT/ML injection Inject 48 units under the skin as advised 20 mL 2  . insulin regular (NOVOLIN R,HUMULIN R) 100 units/mL injection Inject up to 60 units under the skin as advised 20 mL 2  . isosorbide mononitrate (IMDUR) 60 MG 24 hr tablet TAKE 1 TABLET BY MOUTH EVERY DAY 90 tablet 3  . KLOR-CON M20 20 MEQ tablet Takes 1/2 tab in morning and 1/2 at night.    . levothyroxine (SYNTHROID, LEVOTHROID) 75 MCG tablet Take 1 tablet (75 mcg total) by mouth daily. 90 tablet 3  . magnesium oxide (MAG-OX) 400 MG tablet Take 400 mg by mouth 2 (two) times daily.    . metoprolol (LOPRESSOR) 100 MG tablet TAKE 1 TABLET BY MOUTH TWICE DAILY 180 tablet 0  . minoxidil (LONITEN) 2.5 MG tablet Take 2.5 mg by mouth 2 (two) times daily.    . minoxidil (LONITEN) 2.5 MG tablet Take 1 tablet (2.5 mg total) by mouth 2 (two) times daily. 180 tablet  1  . nitroGLYCERIN (NITROSTAT) 0.4 MG SL tablet Place 0.4 mg under the tongue every 5 (five) minutes as needed for chest pain.    . polyethylene glycol (MIRALAX / GLYCOLAX) packet Take 17 g  by mouth daily.     . pravastatin (PRAVACHOL) 20 MG tablet Take 10 mg by mouth at bedtime.      . prednisoLONE acetate (PRED FORTE) 1 % ophthalmic suspension Place 1 drop into the left eye 2 (two) times daily.     . predniSONE (DELTASONE) 10 MG tablet Take 10 mg by mouth every morning.     . tacrolimus (PROGRAF) 1 MG capsule Take 9 mg by mouth 2 (two) times daily.     . timolol (TIMOPTIC) 0.25 % ophthalmic solution Place 1 drop into the right eye 2 (two) times daily.     No current facility-administered medications for this visit.    OBJECTIVE: Middle-aged [place] American woman who appears stated age 39 Vitals:   03/07/14 1159  BP: 134/65  Pulse: 72  Temp: 98.3 F (36.8 C)  Resp: 18     Body mass index is 32.77 kg/(m^2).      ECOG FS:1 - Symptomatic but completely ambulatory  Sclerae unicteric, EOMs intact Oropharynx clear and moist No cervical or supraclavicular adenopathy Lungs no rales or rhonchi Heart regular rate and rhythm Abd soft, obese, nontender, positive bowel sounds MSK no focal spinal tenderness, no upper extremity lymphedema Neuro: nonfocal, well oriented, friendly affect Breasts: The right breast is status post lumpectomy and radiation. There is no evidence of local recurrence. The right axilla is benign. The left breast is unremarkable   LAB RESULTS:  CMP     Component Value Date/Time   NA 145 03/07/2014 1128   NA 142 01/28/2014 2243   K 3.9 03/07/2014 1128   K 4.2 01/28/2014 2243   CL 101 01/28/2014 2243   CO2 31* 03/07/2014 1128   CO2 29 09/29/2013 1440   GLUCOSE 154* 03/07/2014 1128   GLUCOSE 223* 01/28/2014 2243   BUN 21.5 03/07/2014 1128   BUN 26* 01/28/2014 2243   CREATININE 1.3* 03/07/2014 1128   CREATININE 1.40* 01/28/2014 2243   CALCIUM 9.6 03/07/2014 1128   CALCIUM 8.9 09/29/2013 1440   PROT 6.8 03/07/2014 1128   PROT 7.5 09/29/2013 1440   ALBUMIN 3.7 03/07/2014 1128   ALBUMIN 4.1 09/29/2013 1440   AST 22 03/07/2014 1128   AST 17  09/29/2013 1440   ALT 17 03/07/2014 1128   ALT 11 09/29/2013 1440   ALKPHOS 60 03/07/2014 1128   ALKPHOS 45 09/29/2013 1440   BILITOT 0.28 03/07/2014 1128   BILITOT 0.6 09/29/2013 1440   GFRNONAA 60* 02/22/2013 1017   GFRAA 69* 02/22/2013 1017    I No results found for: SPEP  Lab Results  Component Value Date   WBC 9.2 03/07/2014   NEUTROABS 7.6* 03/07/2014   HGB 12.5 03/07/2014   HCT 39.4 03/07/2014   MCV 88.4 03/07/2014   PLT 222 03/07/2014      Chemistry      Component Value Date/Time   NA 145 03/07/2014 1128   NA 142 01/28/2014 2243   K 3.9 03/07/2014 1128   K 4.2 01/28/2014 2243   CL 101 01/28/2014 2243   CO2 31* 03/07/2014 1128   CO2 29 09/29/2013 1440   BUN 21.5 03/07/2014 1128   BUN 26* 01/28/2014 2243   CREATININE 1.3* 03/07/2014 1128   CREATININE 1.40* 01/28/2014 2243      Component  Value Date/Time   CALCIUM 9.6 03/07/2014 1128   CALCIUM 8.9 09/29/2013 1440   ALKPHOS 60 03/07/2014 1128   ALKPHOS 45 09/29/2013 1440   AST 22 03/07/2014 1128   AST 17 09/29/2013 1440   ALT 17 03/07/2014 1128   ALT 11 09/29/2013 1440   BILITOT 0.28 03/07/2014 1128   BILITOT 0.6 09/29/2013 1440       No results found for: LABCA2  No components found for: LABCA125  No results for input(s): INR in the last 168 hours.  Urinalysis    Component Value Date/Time   COLORURINE AMBER* 02/14/2013 0228   APPEARANCEUR CLOUDY* 02/14/2013 0228   LABSPEC 1.023 02/14/2013 0228   PHURINE 5.5 02/14/2013 0228   GLUCOSEU 250* 02/14/2013 0228   GLUCOSEU NEGATIVE 12/05/2011 1027   HGBUR TRACE* 02/14/2013 0228   BILIRUBINUR SMALL* 02/14/2013 0228   KETONESUR 15* 02/14/2013 0228   PROTEINUR 100* 02/14/2013 0228   UROBILINOGEN 1.0 02/14/2013 0228   NITRITE NEGATIVE 02/14/2013 0228   LEUKOCYTESUR NEGATIVE 02/14/2013 0228    STUDIES: Mammography December 2015 was unremarkable  ASSESSMENT: 64 y.o. [place], [place] woman status post right lumpectomy and sentinel lymph node  sampling 11/09/2012 for a pT2 pN0, stage IIA invasive ductal carcinoma, grade 3, triple negative, with an MIB-1 of 56%  (1) started adjuvant cyclophosphamide, methotrexate and fluorouracil 12-2012, received 3 cycles, then had a fall with injury to her left leg requiring skin grafting, and her subsequent cycles were delayed.  (2) resuming CMF treatment 05/24/2013, completed 07/05/2013  (3) adjuvant radiation in [place], [place] 08/09/2013 through 09/15/2013  PLAN:  Hanaan is doing fine from a breast cancer point of view, with no evidence of active disease. We discussed the fact that in many ways her diabetes and cardiovascular disease are more of a concern. I strongly urged her to start a regular exercise program and gave her a [employer].  Otherwise she will return to see [place] again in 6 months. We will do lab work and physical exam before that visit. She knows to call for any problems that may develop before that.  Chauncey Cruel, MD  03/07/2014 12:23 PM

## 2014-03-07 NOTE — Telephone Encounter (Signed)
per pof to sch pt appt-gave pt copy of sch °

## 2014-03-07 NOTE — Addendum Note (Signed)
Addended by: Laureen Abrahams on: 03/07/2014 02:00 PM   Modules accepted: Medications

## 2014-03-30 ENCOUNTER — Encounter: Payer: Self-pay | Admitting: Internal Medicine

## 2014-03-30 ENCOUNTER — Ambulatory Visit (INDEPENDENT_AMBULATORY_CARE_PROVIDER_SITE_OTHER): Payer: Medicare Other | Admitting: Internal Medicine

## 2014-03-30 VITALS — BP 136/88 | HR 70 | Temp 97.9°F | Resp 20 | Ht 65.0 in | Wt 199.1 lb

## 2014-03-30 DIAGNOSIS — Z0189 Encounter for other specified special examinations: Secondary | ICD-10-CM

## 2014-03-30 DIAGNOSIS — I2510 Atherosclerotic heart disease of native coronary artery without angina pectoris: Secondary | ICD-10-CM | POA: Diagnosis not present

## 2014-03-30 DIAGNOSIS — I10 Essential (primary) hypertension: Secondary | ICD-10-CM

## 2014-03-30 DIAGNOSIS — M1990 Unspecified osteoarthritis, unspecified site: Secondary | ICD-10-CM | POA: Diagnosis not present

## 2014-03-30 DIAGNOSIS — J309 Allergic rhinitis, unspecified: Secondary | ICD-10-CM | POA: Diagnosis not present

## 2014-03-30 DIAGNOSIS — Z0000 Encounter for general adult medical examination without abnormal findings: Secondary | ICD-10-CM

## 2014-03-30 MED ORDER — DICLOFENAC SODIUM 1 % TD GEL: 4.0000 g | Freq: Four times a day (QID) | TRANSDERMAL | Status: DC | PRN

## 2014-03-30 MED ORDER — METHYLPREDNISOLONE ACETATE 80 MG/ML IJ SUSP
80.0000 mg | Freq: Once | INTRAMUSCULAR | Status: AC
  Administered 2014-03-30: 80 mg via INTRAMUSCULAR

## 2014-03-30 MED ORDER — FLUTICASONE PROPIONATE 50 MCG/ACT NA SUSP: 2.0000 | Freq: Every day | NASAL | Status: DC

## 2014-03-30 NOTE — Assessment & Plan Note (Signed)
Ok for voltaren gel prn,  to f/u any worsening symptoms or concerns °

## 2014-03-30 NOTE — Progress Notes (Signed)
Subjective:    Patient ID: Monica Lee, female    DOB: [birth year], 64 y.o.   MRN: [id number]  HPI  Here to f/u; c/o several arthritic joint pains to knees and ankles.  Asks for topical for arthritis, she wants to avoid pills if possible..  Missed appt with endo due to weather recently.  Lower price insulin more affordable and working "[place]" b/c cbg < 200s most often per pt.  Pt denies chest pain, increased sob or doe, wheezing, orthopnea, PND, increased LE swelling, palpitations, dizziness or syncope.  Does have several wks ongoing nasal allergy symptoms with clearish congestion, itch and sneezing, without fever, pain, ST, cough, swelling or wheezing.  Denies hyper or hypo thyroid symptoms such as voice, skin or hair change. Past Medical History  Diagnosis Date  . S/P kidney transplant     CADAVERIC --  05/2006  . Hyperlipidemia   . DJD (degenerative joint disease)   . Hx of colonic polyps   . Diabetic retinopathy     RIGHT EYE  . Hypertension   . Asthma   . Blind left eye     OCCLUDED CORNEA,  FUNDI--  FAILED SURGERY REPAIR 2003  . Chronic diastolic CHF (congestive heart failure)     a. Cardiac MRI (10/2012): Moderate to severe LVH, EF 55%, chordal SAM but no LVOT gradient, mod circumferential effusion, mild LAE.  b.  Echocardiogram (11/06/12): Severe LVH, EF 46-27%, grade 1 diastolic dysfunction, moderate effusion.  . Wears glasses   . History of myocardial infarction     2011--  S/P CARDIAC CATH ([place])  NON-OBSTRUCTIVE CAD  . GERD (gastroesophageal reflux disease)   . History of kidney stones   . History of CVA (cerebrovascular accident) without residual deficits     2008  AND TIA IN 2008  W/ NO RESIDUAL  . History of peptic ulcer     2011--  RESOLVED  . 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 Gastroparesis due to DM   .  Chronic kidney disease (CKD), stage III (moderate)     NEPHROLOGIST--  DR Freddrick March  . Open thigh wound     ANTERIOR  . Coronary artery disease CARDIOLOGIST--  DR Johnsie Cancel    NON-OBSTRUCTIVE CAD  PER CARDIAC CATH  2011  . Pericardial effusion without cardiac tamponade     PER DR NISHAN  NOTE --  NOT MALIGNANT  ? RELATED TO HYPOTHYROIDISM  . Thyromegaly     MULTINODULER GOITER  . Hyperparathyroidism, secondary renal   . Anemia in chronic kidney disease   . Trigeminal neuralgia   . Hypothyroidism   . At risk for sleep apnea     STOP-BANG= 4   SENT TO PCP  03-25-2013   Past Surgical History  Procedure Laterality Date  . Transplantation renal  05/2006    CADAVERIC  . Breast biopsy  2010  . Cholecystectomy  2008  . Hemorrhoid surgery  01/23/2012    Procedure: HEMORRHOIDECTOMY PROLAPSED;  Surgeon: Leighton Ruff, MD;  Location: [facility];  Service: General;  Laterality: N/A;  . Foot surgery Right 2013  . Breast lumpectomy with sentinel lymph node biopsy Right 11/09/2012    Procedure: RIGHT BREAST LUMPECTOMY WITH SENTINEL LYMPH NODE REMOVAL;  Surgeon: Haywood Lasso, MD;  Location: [place];  Service: General;  Laterality: Right;  . Portacath placement Right 11/30/2012    Procedure: INSERTION PORT-A-CATH;  Surgeon: Haywood Lasso, MD;  Location: [place];  Service: General;  Laterality: Right;  . Irrigation and debridement abscess Left 02/14/2013    Procedure: IRRIGATION AND DEBRIDEMENT LEFT THIGH ABSCESS;  Surgeon: Joyice Faster. Cornett, MD;  Location: [place];  Service: General;  Laterality: Left;  . Incision and drainage of wound Left 02/18/2013    Procedure: IRRIGATION AND DEBRIDEMENT THIGH WOUND;  Surgeon: Rolm Bookbinder, MD;  Location: [place];  Service: General;  Laterality: Left;  . Cataract extraction w/ intraocular lens  implant, bilateral    . Repair  intestinal perforation surgery  01-31-2009  . Cardiac catheterization  04-12-2009  DR EVELYNE GOUDREAU  ([facility] IN [place], [place])    NON-OBSTRUCTIVE CAD/  mLAD 40%,  oLAD 50%,  dCFX 40%,  OM1  40%,  mRCA  &  dRCA  50%/  LVEF 65% /  ELEVATED  LVEDP  . Transthoracic echocardiogram  01-19-2013  DR NISHAN    SEVERE LVH/  EF 33-82%/  GRADE I DIASTOLIC DYSFUNCTION/  MODERATE LAE/  MILD IMPROVEMENT OF MODERATE CIRCUMFERENTIAL INFERIOROLATERAL PERICARDIAL EFFUSION WITH NO TAMPONADE  . Cardiovascular stress test  11-04-2012  DR NISHAN    HIGH RISK NUCLEAR STUDY/  FIXED DEFECT AFFECTING ENTIRE INFEROLATERAL AND ANTEROLATERAL WALL/  MODERATE ISCHEMIA  AT MID/ APICAL ANTEROR AND INFERIOR WALL/  GLOBAL HYPOKINESIS/  LVEF 28% (FELT TO BE FALSE +,  ECHO & cMRI  NORMAL EF AND NORMAL WM)  . Vaginal hysterectomy  1990's    W/  BILATERAL SALPINGOOPHORECTOMY  . Eye surgery Left 2003    REPAIR CORNEA  . Skin split graft Left 03/29/2013    Procedure: SKIN GRAFT SPLIT THICKNESS WITH A-CELL AND VAC TO LEFT THIGH;  Surgeon: Theodoro Kos, DO;  Location: [place];  Service: Plastics;  Laterality: Left;    reports that she has never smoked. She has never used smokeless tobacco. She reports that she does not drink alcohol or use illicit drugs. family history includes Arthritis in her other; Diabetes in her other and other; Heart disease in her father and other; Hypertension in her other; Thyroid disease in her other. There is no history of Kidney disease. Allergies  Allergen Reactions  . Keflex [Cephalexin] Swelling    Swelling to face, chills Tolerates zosyn 02/14/13  . Propoxyphene N-Acetaminophen Hives and Itching    Patient can tolerate acetaminophen solely   Current Outpatient Prescriptions on File Prior to Visit  Medication Sig Dispense Refill  . albuterol (ACCUNEB) 0.63 MG/3ML nebulizer solution Take 1 ampule by nebulization every 6 (six) hours as needed for wheezing.    Marland Kitchen albuterol (PROVENTIL HFA;VENTOLIN HFA) 108 (90 BASE) MCG/ACT inhaler Inhale 2 puffs into the lungs every 6 (six) hours as needed  for wheezing or shortness of breath. 1 Inhaler 11  . amLODipine (NORVASC) 10 MG tablet TAKE 1 TABLET BY MOUTH EVERY DAY 90 tablet 1  . aspirin EC 81 MG tablet Take 81 mg by mouth daily.    Marland Kitchen azaTHIOprine (IMURAN) 50 MG tablet Take 75 mg by mouth daily.     . brimonidine (ALPHAGAN) 0.2 % ophthalmic solution Place 1 drop into the right eye 2 (two) times daily.     . cinacalcet (SENSIPAR) 30 MG tablet Take 30 mg by mouth every other day.     Mariane Baumgarten Sodium POWD 1 Package by Does not apply route daily. Puts  in coffee    . feeding supplement, GLUCERNA SHAKE, (GLUCERNA SHAKE) LIQD Take 237 mLs by mouth 2 (two) times daily between meals.  0  . furosemide (LASIX) 80 MG tablet Take 160 mg by mouth 2 (two) times daily.     Marland Kitchen HYDROcodone-acetaminophen (NORCO/VICODIN) 5-325 MG per tablet Take 1 tablet by mouth every 4 (four) hours as needed. 15 tablet 0  . insulin NPH Human (HUMULIN N,NOVOLIN N) 100 UNIT/ML injection Inject 48 units under the skin as advised 20 mL 2  . insulin regular (NOVOLIN R,HUMULIN R) 100 units/mL injection Inject up to 60 units under the skin as advised 20 mL 2  . isosorbide mononitrate (IMDUR) 60 MG 24 hr tablet TAKE 1 TABLET BY MOUTH EVERY DAY 90 tablet 3  . KLOR-CON M20 20 MEQ tablet Takes 1/2 tab in morning and 1/2 at night.    . levothyroxine (SYNTHROID, LEVOTHROID) 75 MCG tablet Take 1 tablet (75 mcg total) by mouth daily. 90 tablet 3  . magnesium oxide (MAG-OX) 400 MG tablet Take 400 mg by mouth 2 (two) times daily.    . metoprolol (LOPRESSOR) 100 MG tablet TAKE 1 TABLET BY MOUTH TWICE DAILY 180 tablet 0  . minoxidil (LONITEN) 2.5 MG tablet Take 2.5 mg by mouth 2 (two) times daily.    . minoxidil (LONITEN) 2.5 MG tablet Take 1 tablet (2.5 mg total) by mouth 2 (two) times daily. 180 tablet 1  . nitroGLYCERIN (NITROSTAT) 0.4 MG SL tablet Place 0.4 mg under the tongue every 5 (five) minutes as needed for chest pain.    . polyethylene glycol (MIRALAX / GLYCOLAX) packet Take 17 g  by mouth daily.     . pravastatin (PRAVACHOL) 20 MG tablet Take 10 mg by mouth at bedtime.      . prednisoLONE acetate (PRED FORTE) 1 % ophthalmic suspension Place 1 drop into the left eye 2 (two) times daily.     . predniSONE (DELTASONE) 10 MG tablet Take 10 mg by mouth every morning.     . tacrolimus (PROGRAF) 1 MG capsule Take 9 mg by mouth 2 (two) times daily.     . timolol (TIMOPTIC) 0.25 % ophthalmic solution Place 1 drop into the right eye 2 (two) times daily.     No current facility-administered medications on file prior to visit.    Review of Systems  Constitutional: Negative for unusual diaphoresis or night sweats HENT: Negative for ringing in ear or discharge Eyes: Negative for double vision or worsening visual disturbance.  Respiratory: Negative for choking and stridor.   Gastrointestinal: Negative for vomiting or other signifcant bowel change Genitourinary: Negative for hematuria or change in urine volume.  Musculoskeletal: Negative for other MSK pain or swelling Skin: Negative for color change and worsening wound.  Neurological: Negative for tremors and numbness other than noted  Psychiatric/Behavioral: Negative for decreased concentration or agitation other than above       Objective:   Physical Exam BP 136/88 mmHg  Pulse 70  Temp(Src) 97.9 F (36.6 C) (Oral)  Resp 20  Ht 5\' 5"  (1.651 m)  Wt 199 lb 1.3 oz (90.302 kg)  BMI 33.13 kg/m2  SpO2 93% VS noted,  Constitutional: Pt appears in no significant distress HENT: Head: NCAT.  Right Ear: External ear normal.  Left Ear: External ear normal.  Eyes: . Pupils are equal, round, and reactive to light.  Conjunctivae and EOM are normal Bilat tm's with mild erythema.  Max sinus areas non tender.  Pharynx with  mild erythema, no exudate Neck: Normal range of motion. Neck supple.  Cardiovascular: Normal rate and regular rhythm.   Pulmonary/Chest: Effort normal and breath sounds decreased  without rales or wheezing.  No  joint effusions Neurological: Pt is alert. Not confused , motor grossly intact Skin: Skin is warm. No rash, no LE edema Psychiatric: Pt behavior is normal. No agitation.     Assessment & Plan:

## 2014-03-30 NOTE — Assessment & Plan Note (Signed)
Mild to mod, for depomedrol and flonase asd,  to f/u any worsening symptoms or concerns

## 2014-03-30 NOTE — Assessment & Plan Note (Signed)
stable overall by history and exam, recent data reviewed with pt, and pt to continue medical treatment as before,  to f/u any worsening symptoms or concerns BP Readings from Last 3 Encounters:  03/30/14 136/88  03/07/14 134/65  01/29/14 158/63

## 2014-03-30 NOTE — Progress Notes (Signed)
Pre visit review using our clinic review tool, if applicable. No additional management support is needed unless otherwise documented below in the visit note. 

## 2014-03-30 NOTE — Patient Instructions (Addendum)
You had the steroid shot today  Please take all new medication as prescribed - the flonase for allergies, and voltaren gel for pain  Please continue all other medications as before, and refills have been done if requested.  Please have the pharmacy call with any other refills you may need.  Please continue your efforts at being more active, low cholesterol diet, and weight control.  Please keep your appointments with your specialists as you may have planned  Please return in 6 months, or sooner if needed

## 2014-03-31 ENCOUNTER — Telehealth: Payer: Self-pay | Admitting: Internal Medicine

## 2014-03-31 NOTE — Telephone Encounter (Signed)
emmi emailed °

## 2014-04-13 ENCOUNTER — Other Ambulatory Visit: Payer: Self-pay | Admitting: *Deleted

## 2014-04-13 MED ORDER — NITROGLYCERIN 0.4 MG SL SUBL: 0.4000 mg | SUBLINGUAL_TABLET | SUBLINGUAL | Status: DC | PRN

## 2014-04-18 DIAGNOSIS — R7989 Other specified abnormal findings of blood chemistry: Secondary | ICD-10-CM | POA: Diagnosis not present

## 2014-04-18 DIAGNOSIS — I5033 Acute on chronic diastolic (congestive) heart failure: Secondary | ICD-10-CM | POA: Diagnosis not present

## 2014-04-18 DIAGNOSIS — Z940 Kidney transplant status: Secondary | ICD-10-CM | POA: Diagnosis not present

## 2014-04-18 DIAGNOSIS — L03113 Cellulitis of right upper limb: Secondary | ICD-10-CM | POA: Diagnosis not present

## 2014-04-18 DIAGNOSIS — E785 Hyperlipidemia, unspecified: Secondary | ICD-10-CM | POA: Diagnosis not present

## 2014-04-18 DIAGNOSIS — I5020 Unspecified systolic (congestive) heart failure: Secondary | ICD-10-CM | POA: Diagnosis not present

## 2014-04-18 DIAGNOSIS — N186 End stage renal disease: Secondary | ICD-10-CM | POA: Diagnosis not present

## 2014-04-18 DIAGNOSIS — Z794 Long term (current) use of insulin: Secondary | ICD-10-CM | POA: Diagnosis not present

## 2014-04-18 DIAGNOSIS — H5442 Blindness, left eye, normal vision right eye: Secondary | ICD-10-CM | POA: Diagnosis not present

## 2014-04-18 DIAGNOSIS — E11649 Type 2 diabetes mellitus with hypoglycemia without coma: Secondary | ICD-10-CM | POA: Diagnosis not present

## 2014-04-18 DIAGNOSIS — E039 Hypothyroidism, unspecified: Secondary | ICD-10-CM | POA: Diagnosis not present

## 2014-04-18 DIAGNOSIS — I120 Hypertensive chronic kidney disease with stage 5 chronic kidney disease or end stage renal disease: Secondary | ICD-10-CM | POA: Diagnosis not present

## 2014-04-18 DIAGNOSIS — I509 Heart failure, unspecified: Secondary | ICD-10-CM | POA: Diagnosis not present

## 2014-04-18 DIAGNOSIS — L02414 Cutaneous abscess of left upper limb: Secondary | ICD-10-CM | POA: Diagnosis not present

## 2014-04-18 DIAGNOSIS — J45909 Unspecified asthma, uncomplicated: Secondary | ICD-10-CM | POA: Diagnosis not present

## 2014-04-19 DIAGNOSIS — L03113 Cellulitis of right upper limb: Secondary | ICD-10-CM | POA: Diagnosis not present

## 2014-04-19 DIAGNOSIS — I5033 Acute on chronic diastolic (congestive) heart failure: Secondary | ICD-10-CM | POA: Diagnosis not present

## 2014-04-19 DIAGNOSIS — I5020 Unspecified systolic (congestive) heart failure: Secondary | ICD-10-CM | POA: Diagnosis not present

## 2014-04-24 ENCOUNTER — Inpatient Hospital Stay (HOSPITAL_COMMUNITY)
Admission: EM | Admit: 2014-04-24 | Discharge: 2014-04-26 | DRG: 292 | Disposition: A | Discharge status: Home / Self Care | Payer: Medicare Other | Attending: Internal Medicine | Admitting: Internal Medicine

## 2014-04-24 ENCOUNTER — Emergency Department (HOSPITAL_COMMUNITY): Payer: Medicare Other

## 2014-04-24 DIAGNOSIS — I129 Hypertensive chronic kidney disease with stage 1 through stage 4 chronic kidney disease, or unspecified chronic kidney disease: Secondary | ICD-10-CM | POA: Diagnosis present

## 2014-04-24 DIAGNOSIS — I509 Heart failure, unspecified: Secondary | ICD-10-CM | POA: Diagnosis not present

## 2014-04-24 DIAGNOSIS — E1159 Type 2 diabetes mellitus with other circulatory complications: Secondary | ICD-10-CM | POA: Diagnosis present

## 2014-04-24 DIAGNOSIS — E785 Hyperlipidemia, unspecified: Secondary | ICD-10-CM | POA: Diagnosis present

## 2014-04-24 DIAGNOSIS — L02413 Cutaneous abscess of right upper limb: Secondary | ICD-10-CM | POA: Diagnosis not present

## 2014-04-24 DIAGNOSIS — R0902 Hypoxemia: Secondary | ICD-10-CM

## 2014-04-24 DIAGNOSIS — Z9842 Cataract extraction status, left eye: Secondary | ICD-10-CM | POA: Diagnosis not present

## 2014-04-24 DIAGNOSIS — E875 Hyperkalemia: Secondary | ICD-10-CM | POA: Diagnosis present

## 2014-04-24 DIAGNOSIS — N183 Chronic kidney disease, stage 3 (moderate): Secondary | ICD-10-CM | POA: Diagnosis present

## 2014-04-24 DIAGNOSIS — I248 Other forms of acute ischemic heart disease: Secondary | ICD-10-CM | POA: Diagnosis present

## 2014-04-24 DIAGNOSIS — Z888 Allergy status to other drugs, medicaments and biological substances status: Secondary | ICD-10-CM

## 2014-04-24 DIAGNOSIS — E11319 Type 2 diabetes mellitus with unspecified diabetic retinopathy without macular edema: Secondary | ICD-10-CM | POA: Diagnosis present

## 2014-04-24 DIAGNOSIS — Z940 Kidney transplant status: Secondary | ICD-10-CM

## 2014-04-24 DIAGNOSIS — K210 Gastro-esophageal reflux disease with esophagitis: Secondary | ICD-10-CM

## 2014-04-24 DIAGNOSIS — R0602 Shortness of breath: Secondary | ICD-10-CM

## 2014-04-24 DIAGNOSIS — D631 Anemia in chronic kidney disease: Secondary | ICD-10-CM | POA: Diagnosis present

## 2014-04-24 DIAGNOSIS — N179 Acute kidney failure, unspecified: Secondary | ICD-10-CM | POA: Diagnosis not present

## 2014-04-24 DIAGNOSIS — Z853 Personal history of malignant neoplasm of breast: Secondary | ICD-10-CM

## 2014-04-24 DIAGNOSIS — E039 Hypothyroidism, unspecified: Secondary | ICD-10-CM | POA: Diagnosis present

## 2014-04-24 DIAGNOSIS — Z881 Allergy status to other antibiotic agents status: Secondary | ICD-10-CM | POA: Diagnosis not present

## 2014-04-24 DIAGNOSIS — Z9049 Acquired absence of other specified parts of digestive tract: Secondary | ICD-10-CM | POA: Diagnosis present

## 2014-04-24 DIAGNOSIS — J4520 Mild intermittent asthma, uncomplicated: Secondary | ICD-10-CM | POA: Diagnosis not present

## 2014-04-24 DIAGNOSIS — H5442 Blindness, left eye, normal vision right eye: Secondary | ICD-10-CM | POA: Diagnosis present

## 2014-04-24 DIAGNOSIS — I252 Old myocardial infarction: Secondary | ICD-10-CM | POA: Diagnosis not present

## 2014-04-24 DIAGNOSIS — E1165 Type 2 diabetes mellitus with hyperglycemia: Secondary | ICD-10-CM | POA: Diagnosis present

## 2014-04-24 DIAGNOSIS — J45909 Unspecified asthma, uncomplicated: Secondary | ICD-10-CM | POA: Diagnosis present

## 2014-04-24 DIAGNOSIS — Z8673 Personal history of transient ischemic attack (TIA), and cerebral infarction without residual deficits: Secondary | ICD-10-CM | POA: Diagnosis not present

## 2014-04-24 DIAGNOSIS — Z9841 Cataract extraction status, right eye: Secondary | ICD-10-CM

## 2014-04-24 DIAGNOSIS — N189 Chronic kidney disease, unspecified: Secondary | ICD-10-CM | POA: Diagnosis present

## 2014-04-24 DIAGNOSIS — Z794 Long term (current) use of insulin: Secondary | ICD-10-CM | POA: Diagnosis not present

## 2014-04-24 DIAGNOSIS — M1990 Unspecified osteoarthritis, unspecified site: Secondary | ICD-10-CM | POA: Diagnosis present

## 2014-04-24 DIAGNOSIS — E1143 Type 2 diabetes mellitus with diabetic autonomic (poly)neuropathy: Secondary | ICD-10-CM | POA: Diagnosis present

## 2014-04-24 DIAGNOSIS — I2510 Atherosclerotic heart disease of native coronary artery without angina pectoris: Secondary | ICD-10-CM | POA: Diagnosis present

## 2014-04-24 DIAGNOSIS — K219 Gastro-esophageal reflux disease without esophagitis: Secondary | ICD-10-CM | POA: Diagnosis present

## 2014-04-24 DIAGNOSIS — G4733 Obstructive sleep apnea (adult) (pediatric): Secondary | ICD-10-CM | POA: Diagnosis present

## 2014-04-24 DIAGNOSIS — I5033 Acute on chronic diastolic (congestive) heart failure: Principal | ICD-10-CM | POA: Diagnosis present

## 2014-04-24 DIAGNOSIS — Z86010 Personal history of colonic polyps: Secondary | ICD-10-CM

## 2014-04-24 DIAGNOSIS — Z90710 Acquired absence of both cervix and uterus: Secondary | ICD-10-CM | POA: Diagnosis not present

## 2014-04-24 DIAGNOSIS — Z7982 Long term (current) use of aspirin: Secondary | ICD-10-CM | POA: Diagnosis not present

## 2014-04-24 DIAGNOSIS — Z961 Presence of intraocular lens: Secondary | ICD-10-CM | POA: Diagnosis present

## 2014-04-24 DIAGNOSIS — Z87442 Personal history of urinary calculi: Secondary | ICD-10-CM

## 2014-04-24 DIAGNOSIS — L02414 Cutaneous abscess of left upper limb: Secondary | ICD-10-CM | POA: Diagnosis present

## 2014-04-24 DIAGNOSIS — K3184 Gastroparesis: Secondary | ICD-10-CM | POA: Diagnosis present

## 2014-04-24 DIAGNOSIS — Z8711 Personal history of peptic ulcer disease: Secondary | ICD-10-CM

## 2014-04-24 DIAGNOSIS — I10 Essential (primary) hypertension: Secondary | ICD-10-CM

## 2014-04-24 DIAGNOSIS — R197 Diarrhea, unspecified: Secondary | ICD-10-CM | POA: Diagnosis present

## 2014-04-24 LAB — I-STAT TROPONIN, ED: Troponin i, poc: 0.04 ng/mL (ref 0.00–0.08)

## 2014-04-24 LAB — BASIC METABOLIC PANEL
ANION GAP: 4 — AB (ref 5–15)
BUN: 26 mg/dL — ABNORMAL HIGH (ref 6–23)
CO2: 34 mmol/L — ABNORMAL HIGH (ref 19–32)
CREATININE: 1.84 mg/dL — AB (ref 0.50–1.10)
Calcium: 9.4 mg/dL (ref 8.4–10.5)
Chloride: 100 mmol/L (ref 96–112)
GFR calc non Af Amer: 28 mL/min — ABNORMAL LOW (ref >90)
GFR, EST AFRICAN AMERICAN: 33 mL/min — AB (ref >90)
GLUCOSE: 163 mg/dL — AB (ref 70–99)
Potassium: 5.2 mmol/L — ABNORMAL HIGH (ref 3.5–5.1)
Sodium: 138 mmol/L (ref 135–145)

## 2014-04-24 LAB — CBC
HCT: 40.4 % (ref 36.0–46.0)
Hemoglobin: 12.3 g/dL (ref 12.0–15.0)
MCH: 28.1 pg (ref 26.0–34.0)
MCHC: 30.4 g/dL (ref 30.0–36.0)
MCV: 92.4 fL (ref 78.0–100.0)
Platelets: 228 10*3/uL (ref 150–400)
RBC: 4.37 MIL/uL (ref 3.87–5.11)
RDW: 16 % — ABNORMAL HIGH (ref 11.5–15.5)
WBC: 4.8 10*3/uL (ref 4.0–10.5)

## 2014-04-24 LAB — BRAIN NATRIURETIC PEPTIDE: B Natriuretic Peptide: 571.8 pg/mL — ABNORMAL HIGH (ref 0.0–100.0)

## 2014-04-24 LAB — CBG MONITORING, ED: Glucose-Capillary: 147 mg/dL — ABNORMAL HIGH (ref 70–99)

## 2014-04-24 NOTE — ED Notes (Signed)
Attempted report 

## 2014-04-24 NOTE — ED Notes (Signed)
Pt reports sob since last night. sts she gained 4 lbs since yesterday. Denies pain. Pt is also a kidney transplant pt. Pt has been taking lasix.

## 2014-04-24 NOTE — ED Provider Notes (Signed)
CSN: [id number]     Arrival date & time 04/24/14  1823 History   First MD Initiated Contact with Patient 04/24/14 1845     Chief Complaint  Patient presents with  . Shortness of Breath  . Congestive Heart Failure     (Consider location/radiation/quality/duration/timing/severity/associated sxs/prior Treatment) Patient is a 64 y.o. female presenting with shortness of breath and CHF. The history is provided by the patient.  Shortness of Breath Severity:  Mild Onset quality:  Gradual Duration:  1 week Timing:  Constant Progression:  Worsening Chronicity:  Recurrent Context: activity   Relieved by:  Rest Worsened by:  Exertion Ineffective treatments:  Diuretics Associated symptoms: no abdominal pain, no chest pain, no cough, no fever, no headaches, no neck pain and no vomiting   Congestive Heart Failure This is a chronic problem. The current episode started more than 1 week ago. The problem occurs constantly. The problem has not changed since onset.Associated symptoms include shortness of breath. Pertinent negatives include no chest pain, no abdominal pain and no headaches. The symptoms are aggravated by walking. Nothing relieves the symptoms. Treatments tried: diuretics. The treatment provided no relief.    Past Medical History  Diagnosis Date  . S/P kidney transplant     CADAVERIC --  05/2006  . Hyperlipidemia   . DJD (degenerative joint disease)   . Hx of colonic polyps   . Diabetic retinopathy     RIGHT EYE  . Hypertension   . Asthma   . Blind left eye     OCCLUDED CORNEA,  FUNDI--  FAILED SURGERY REPAIR 2003  . Chronic diastolic CHF (congestive heart failure)     a. Cardiac MRI (10/2012): Moderate to severe LVH, EF 55%, chordal SAM but no LVOT gradient, mod circumferential effusion, mild LAE.  b.  Echocardiogram (11/06/12): Severe LVH, EF 00-86%, grade 1 diastolic dysfunction, moderate effusion.  . Wears glasses   . History of myocardial infarction     2011--  S/P CARDIAC  CATH ([place])  NON-OBSTRUCTIVE CAD  . GERD (gastroesophageal reflux disease)   . History of kidney stones   . History of CVA (cerebrovascular accident) without residual deficits     2008  AND TIA IN 2008  W/ NO RESIDUAL  . History of peptic ulcer     2011--  RESOLVED  . 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 Gastroparesis due to DM   . Chronic kidney disease (CKD), stage III (moderate)     NEPHROLOGIST--  DR Freddrick March  . Open thigh wound     ANTERIOR  . Coronary artery disease CARDIOLOGIST--  DR Johnsie Cancel    NON-OBSTRUCTIVE CAD  PER CARDIAC CATH  2011  . Pericardial effusion without cardiac tamponade     PER DR NISHAN  NOTE --  NOT MALIGNANT  ? RELATED TO HYPOTHYROIDISM  . Thyromegaly     MULTINODULER GOITER  . Hyperparathyroidism, secondary renal   . Anemia in chronic kidney disease   . Trigeminal neuralgia   . Hypothyroidism   . At risk for sleep apnea     STOP-BANG= 4   SENT TO PCP  03-25-2013   Past Surgical History  Procedure Laterality Date  . Transplantation renal  05/2006    CADAVERIC  . Breast biopsy  2010  . Cholecystectomy  2008  .  Hemorrhoid surgery  01/23/2012    Procedure: HEMORRHOIDECTOMY PROLAPSED;  Surgeon: Leighton Ruff, MD;  Location: [facility];  Service: General;  Laterality: N/A;  . Foot surgery Right 2013  . Breast lumpectomy with sentinel lymph node biopsy Right 11/09/2012    Procedure: RIGHT BREAST LUMPECTOMY WITH SENTINEL LYMPH NODE REMOVAL;  Surgeon: Haywood Lasso, MD;  Location: [place];  Service: General;  Laterality: Right;  . Portacath placement Right 11/30/2012    Procedure: INSERTION PORT-A-CATH;  Surgeon: Haywood Lasso, MD;  Location: [place];  Service: General;  Laterality: Right;  . Irrigation and debridement abscess Left 02/14/2013    Procedure:  IRRIGATION AND DEBRIDEMENT LEFT THIGH ABSCESS;  Surgeon: Joyice Faster. Cornett, MD;  Location: [place];  Service: General;  Laterality: Left;  . Incision and drainage of wound Left 02/18/2013    Procedure: IRRIGATION AND DEBRIDEMENT THIGH WOUND;  Surgeon: Rolm Bookbinder, MD;  Location: [place];  Service: General;  Laterality: Left;  . Cataract extraction w/ intraocular lens  implant, bilateral    . Repair  intestinal perforation surgery  01-31-2009  . Cardiac catheterization  04-12-2009  DR EVELYNE GOUDREAU ([facility] IN [place], [place])    NON-OBSTRUCTIVE CAD/  mLAD 40%,  oLAD 50%,  dCFX 40%,  OM1  40%,  mRCA  &  dRCA  50%/  LVEF 65% /  ELEVATED  LVEDP  . Transthoracic echocardiogram  01-19-2013  DR NISHAN    SEVERE LVH/  EF 67-61%/  GRADE I DIASTOLIC DYSFUNCTION/  MODERATE LAE/  MILD IMPROVEMENT OF MODERATE CIRCUMFERENTIAL INFERIOROLATERAL PERICARDIAL EFFUSION WITH NO TAMPONADE  . Cardiovascular stress test  11-04-2012  DR NISHAN    HIGH RISK NUCLEAR STUDY/  FIXED DEFECT AFFECTING ENTIRE INFEROLATERAL AND ANTEROLATERAL WALL/  MODERATE ISCHEMIA  AT MID/ APICAL ANTEROR AND INFERIOR WALL/  GLOBAL HYPOKINESIS/  LVEF 28% (FELT TO BE FALSE +,  ECHO & cMRI  NORMAL EF AND NORMAL WM)  . Vaginal hysterectomy  1990's    W/  BILATERAL SALPINGOOPHORECTOMY  . Eye surgery Left 2003    REPAIR CORNEA  . Skin split graft Left 03/29/2013    Procedure: SKIN GRAFT SPLIT THICKNESS WITH A-CELL AND VAC TO LEFT THIGH;  Surgeon: Theodoro Kos, DO;  Location: [place];  Service: Plastics;  Laterality: Left;   Family History  Problem Relation Age of Onset  . Kidney disease Neg Hx   . Arthritis Other     parent  . Heart disease Other     parent  . Hypertension Other     parent  . Diabetes Other     parent  . Diabetes Other     grandparent  . Thyroid disease Other     several  . Heart disease Father    History  Substance Use Topics  . Smoking status: Never Smoker   . Smokeless tobacco: Never Used  .  Alcohol Use: No   OB History    No data available     Review of Systems  Constitutional: Negative for fever and fatigue.  HENT: Negative for congestion and drooling.   Eyes: Negative for pain.  Respiratory: Positive for shortness of breath. Negative for cough.   Cardiovascular: Negative for chest pain.  Gastrointestinal: Negative for nausea, vomiting, abdominal pain and diarrhea.  Genitourinary: Negative for dysuria and hematuria.  Musculoskeletal: Negative for back pain, gait problem and neck pain.  Skin: Negative for color change.  Neurological: Negative for dizziness and headaches.  Hematological: Negative  for adenopathy.  Psychiatric/Behavioral: Negative for behavioral problems.  All other systems reviewed and are negative.     Allergies  Keflex and Propoxyphene n-acetaminophen  Home Medications   Prior to Admission medications   Medication Sig Start Date End Date Taking? Authorizing Provider  albuterol (ACCUNEB) 0.63 MG/3ML nebulizer solution Take 1 ampule by nebulization every 6 (six) hours as needed for wheezing.    Historical Provider, MD  albuterol (PROVENTIL HFA;VENTOLIN HFA) 108 (90 BASE) MCG/ACT inhaler Inhale 2 puffs into the lungs every 6 (six) hours as needed for wheezing or shortness of breath. 01/04/14   Biagio Borg, MD  amLODipine (NORVASC) 10 MG tablet TAKE 1 TABLET BY MOUTH EVERY DAY 10/18/13   Biagio Borg, MD  aspirin EC 81 MG tablet Take 81 mg by mouth daily.    Historical Provider, MD  azaTHIOprine (IMURAN) 50 MG tablet Take 75 mg by mouth daily.     Historical Provider, MD  brimonidine (ALPHAGAN) 0.2 % ophthalmic solution Place 1 drop into the right eye 2 (two) times daily.  03/10/13   Historical Provider, MD  cinacalcet (SENSIPAR) 30 MG tablet Take 30 mg by mouth every other day.  04/09/12   Biagio Borg, MD  diclofenac sodium (VOLTAREN) 1 % GEL Apply 4 g topically 4 (four) times daily as needed. 03/30/14   Biagio Borg, MD  Docusate Sodium POWD 1 Package  by Does not apply route daily. Puts in coffee    Historical Provider, MD  feeding supplement, GLUCERNA SHAKE, (GLUCERNA SHAKE) LIQD Take 237 mLs by mouth 2 (two) times daily between meals. 02/23/13   Hosie Poisson, MD  fluticasone (FLONASE) 50 MCG/ACT nasal spray Place 2 sprays into both nostrils daily. 03/30/14   Biagio Borg, MD  furosemide (LASIX) 80 MG tablet Take 160 mg by mouth 2 (two) times daily.     Historical Provider, MD  HYDROcodone-acetaminophen (NORCO/VICODIN) 5-325 MG per tablet Take 1 tablet by mouth every 4 (four) hours as needed. 01/29/14   Katheren Shams, MD  insulin NPH Human (HUMULIN N,NOVOLIN N) 100 UNIT/ML injection Inject 48 units under the skin as advised 12/28/13   Philemon Kingdom, MD  insulin regular (NOVOLIN R,HUMULIN R) 100 units/mL injection Inject up to 60 units under the skin as advised 12/28/13   Philemon Kingdom, MD  isosorbide mononitrate (IMDUR) 60 MG 24 hr tablet TAKE 1 TABLET BY MOUTH EVERY DAY 01/21/14   Biagio Borg, MD  KLOR-CON M20 20 MEQ tablet Takes 1/2 tab in morning and 1/2 at night. 02/11/13   Historical Provider, MD  levothyroxine (SYNTHROID, LEVOTHROID) 75 MCG tablet Take 1 tablet (75 mcg total) by mouth daily. 03/25/13   Biagio Borg, MD  magnesium oxide (MAG-OX) 400 MG tablet Take 400 mg by mouth 2 (two) times daily.    Historical Provider, MD  metoprolol (LOPRESSOR) 100 MG tablet TAKE 1 TABLET BY MOUTH TWICE DAILY 02/01/14   Josue Hector, MD  minoxidil (LONITEN) 2.5 MG tablet Take 2.5 mg by mouth 2 (two) times daily.    Historical Provider, MD  minoxidil (LONITEN) 2.5 MG tablet Take 1 tablet (2.5 mg total) by mouth 2 (two) times daily. 01/31/14   Biagio Borg, MD  nitroGLYCERIN (NITROSTAT) 0.4 MG SL tablet Place 1 tablet (0.4 mg total) under the tongue every 5 (five) minutes as needed for chest pain. 04/13/14   Josue Hector, MD  polyethylene glycol [facility] / GLYCOLAX) packet Take 17 g by mouth daily.  09/29/13  Historical Provider, MD  pravastatin  (PRAVACHOL) 20 MG tablet Take 10 mg by mouth at bedtime.      Historical Provider, MD  prednisoLONE acetate (PRED FORTE) 1 % ophthalmic suspension Place 1 drop into the left eye 2 (two) times daily.     Historical Provider, MD  predniSONE (DELTASONE) 10 MG tablet Take 10 mg by mouth every morning.     Historical Provider, MD  tacrolimus (PROGRAF) 1 MG capsule Take 9 mg by mouth 2 (two) times daily.     Historical Provider, MD  timolol (TIMOPTIC) 0.25 % ophthalmic solution Place 1 drop into the right eye 2 (two) times daily.    Historical Provider, MD   BP 132/51 mmHg  Pulse 68  Temp(Src) 98.3 F (36.8 C) (Oral)  Resp 18  Ht 5\' 5"  (1.651 m)  Wt 200 lb (90.719 kg)  BMI 33.28 kg/m2  SpO2 95% Physical Exam  Constitutional: She is oriented to person, place, and time. She appears well-developed and well-nourished.  HENT:  Head: Normocephalic.  Mouth/Throat: Oropharynx is clear and moist. No oropharyngeal exudate.  Eyes: Conjunctivae and EOM are normal. Pupils are equal, round, and reactive to light.  Neck: Normal range of motion. Neck supple.  Cardiovascular: Normal rate, regular rhythm, normal heart sounds and intact distal pulses.  Exam reveals no gallop and no friction rub.   No murmur heard. Pulmonary/Chest: Effort normal and breath sounds normal. No respiratory distress. She has no wheezes.  Abdominal: Soft. Bowel sounds are normal. There is no tenderness. There is no rebound and no guarding.  Musculoskeletal: Normal range of motion. She exhibits no edema or tenderness.  Neurological: She is alert and oriented to person, place, and time.  Skin: Skin is warm and dry.  Psychiatric: She has a normal mood and affect. Her behavior is normal.  Nursing note and vitals reviewed.   ED Course  Procedures (including critical care time) Labs Review Labs Reviewed  CBC - Abnormal; Notable for the following:    RDW 16.0 (*)    All other components within normal limits  BASIC METABOLIC PANEL -  Abnormal; Notable for the following:    Potassium 5.2 (*)    CO2 34 (*)    Glucose, Bld 163 (*)    BUN 26 (*)    Creatinine, Ser 1.84 (*)    GFR calc non Af Amer 28 (*)    GFR calc Af Amer 33 (*)    Anion gap 4 (*)    All other components within normal limits  BRAIN NATRIURETIC PEPTIDE - Abnormal; Notable for the following:    B Natriuretic Peptide 571.8 (*)    All other components within normal limits  CBG MONITORING, ED - Abnormal; Notable for the following:    Glucose-Capillary 147 (*)    All other components within normal limits  I-STAT TROPOININ, ED    Imaging Review Dg Chest 2 View  04/24/2014   CLINICAL DATA:  Shortness of breath for 6 days. History of congestive heart failure. History of hypertension, diabetes and asthma.  EXAM: CHEST  2 VIEW  COMPARISON:  11/30/2012.  FINDINGS: Right subclavian Port-A-Cath tip appears unchanged in the mid SVC. There is stable cardiomegaly and chronic vascular congestion. Mild atelectasis is present at both lung bases. There is no confluent airspace opacity or significant pleural effusion. The bones appear unchanged.  IMPRESSION: Stable cardiomegaly and chronic vascular congestion. No acute findings.   Electronically Signed   By: Richardean Sale M.D.   On:  04/24/2014 20:49     EKG Interpretation   Date/Time:  Sunday April 24 2014 18:27:42 EDT Ventricular Rate:  68 PR Interval:  162 QRS Duration: 88 QT Interval:  462 QTC Calculation: 491 R Axis:   99 Text Interpretation:  Normal sinus rhythm Rightward axis Septal infarct ,  age undetermined ST \\T \ T wave abnormality, consider inferior ischemia ST  \T\ T wave abnormality, consider anterolateral ischemia LVH w/ repol abn  No significant change since last tracing Confirmed by Tiffinie Caillier  MD,  Torrence Branagan ([id number]) on 04/24/2014 6:57:31 PM      MDM   Final diagnoses:  SOB (shortness of breath)  Hypoxia    6:57 PM 64 y.o. female w/ hx of kidney txpt (2008) on prograf, HTN, chronic diastolic  chf, DM, CAD who presents with gradual onset shortness of breath over the last week. She states that she feels okay at rest but is more short of breath with exertion. She denies any cough, fever, or pain. She states that she did have some pain earlier today in her bilateral shoulders which she has had many times before which has resolved. She states that she has slowly gained weight over the last week and gained 4 pounds overnight. She did not have any significant edema in her extremities on my exam. She states that she has been taking 160 mg of Lasix twice a day without significant relief. We'll get screening labs and imaging. Vital signs unremarkable. She denies any cp.   Will admit to hospitalist given hypoxia w/ ambulation and worsening sob.    Pamella Pert, MD 04/24/14 2312

## 2014-04-24 NOTE — ED Notes (Signed)
Pt ambulated in hallway, stats dropped to 88% on room air, complains of SOB, no distress noted.

## 2014-04-24 NOTE — H&P (Signed)
[facility] History and Physical  Monica Lee SJG:[id number] DOB: 04-09-1950 DOA: 04/24/2014  Referring physician: ED physician PCP: [name], MD  Specialists:   Chief Complaint: Shortness of breath  HPI: Monica Lee is a 64 y.o. female with past medical history for kidney transplantation 2008 (cadaveric), hypertension, hyperlipidemia, hypothyroidism, asthma, left eye blindness, coronary artery disease, history of stroke, diastolic congestive heart failure, diabetes mellitus, who presents with shortness of breath.  Patient reports that she has a worsening shortness of breath for about one week. Her leg edema has been getting worse. Her body weight increased by 4 pounds. She reports that she is taking high dose of Lasix 160 mg twice a day, has not missed any dose. She does not have chest pain, fever or chills.  She also reports that she has a small abscess over left upper arm, which was drained. She was started with Bactrim on Monday and is supposed to take it for 7 days. After she started taking his antibiotics, she developed diarrhea. She had 3 bowel movements with loose stool. She does not have significant abdominal pain, nausea or vomiting.  Patient denies fever, chills, abdominal pain, dysuria, urgency, frequency, hematuria, skin rashes. No unilateral weakness, numbness or tingling sensations. No vision change or hearing loss.  In ED, patient was found to have BNP 571, negative troponin, AoCKD-III, normal temperature, no tachycardia, potassium 5.2. Chest x-ray showed pulmonary vascular congestion. Patient is admitted to inpatient for further evaluation and treatment.  Review of Systems: As presented in the history of presenting illness, rest negative.  Where does patient live?  At home Can patient participate in ADLs? Yes  Allergy:  Allergies  Allergen Reactions  . Keflex [Cephalexin] Swelling    Swelling to face, chills Tolerates zosyn 02/14/13  . Propoxyphene  N-Acetaminophen Hives and Itching    Patient can tolerate acetaminophen solely    Past Medical History  Diagnosis Date  . S/P kidney transplant     CADAVERIC --  05/2006  . Hyperlipidemia   . DJD (degenerative joint disease)   . Hx of colonic polyps   . Diabetic retinopathy     RIGHT EYE  . Hypertension   . Asthma   . Blind left eye     OCCLUDED CORNEA,  FUNDI--  FAILED SURGERY REPAIR 2003  . Chronic diastolic CHF (congestive heart failure)     a. Cardiac MRI (10/2012): Moderate to severe LVH, EF 55%, chordal SAM but no LVOT gradient, mod circumferential effusion, mild LAE.  b.  Echocardiogram (11/06/12): Severe LVH, EF 65-46%, grade 1 diastolic dysfunction, moderate effusion.  . Wears glasses   . History of myocardial infarction     2011--  S/P CARDIAC CATH ([place])  NON-OBSTRUCTIVE CAD  . GERD (gastroesophageal reflux disease)   . History of kidney stones   . History of CVA (cerebrovascular accident) without residual deficits     2008  AND TIA IN 2008  W/ NO RESIDUAL  . History of peptic ulcer     2011--  RESOLVED  . 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 Gastroparesis due to DM   . Chronic kidney disease (CKD), stage III (moderate)     NEPHROLOGIST--  DR Freddrick March  . Open thigh wound     ANTERIOR  . Coronary artery  disease CARDIOLOGIST--  DR Johnsie Cancel    NON-OBSTRUCTIVE CAD  PER CARDIAC CATH  2011  . Pericardial effusion without cardiac tamponade     PER DR NISHAN  NOTE --  NOT MALIGNANT  ? RELATED TO HYPOTHYROIDISM  . Thyromegaly     MULTINODULER GOITER  . Hyperparathyroidism, secondary renal   . Anemia in chronic kidney disease   . Trigeminal neuralgia   . Hypothyroidism   . At risk for sleep apnea     STOP-BANG= 4   SENT TO PCP  03-25-2013    Past Surgical History  Procedure Laterality Date  . Transplantation  renal  05/2006    CADAVERIC  . Breast biopsy  2010  . Cholecystectomy  2008  . Hemorrhoid surgery  01/23/2012    Procedure: HEMORRHOIDECTOMY PROLAPSED;  Surgeon: Leighton Ruff, MD;  Location: [facility];  Service: General;  Laterality: N/A;  . Foot surgery Right 2013  . Breast lumpectomy with sentinel lymph node biopsy Right 11/09/2012    Procedure: RIGHT BREAST LUMPECTOMY WITH SENTINEL LYMPH NODE REMOVAL;  Surgeon: Haywood Lasso, MD;  Location: [place];  Service: General;  Laterality: Right;  . Portacath placement Right 11/30/2012    Procedure: INSERTION PORT-A-CATH;  Surgeon: Haywood Lasso, MD;  Location: [place];  Service: General;  Laterality: Right;  . Irrigation and debridement abscess Left 02/14/2013    Procedure: IRRIGATION AND DEBRIDEMENT LEFT THIGH ABSCESS;  Surgeon: Joyice Faster. Cornett, MD;  Location: [place];  Service: General;  Laterality: Left;  . Incision and drainage of wound Left 02/18/2013    Procedure: IRRIGATION AND DEBRIDEMENT THIGH WOUND;  Surgeon: Rolm Bookbinder, MD;  Location: [place];  Service: General;  Laterality: Left;  . Cataract extraction w/ intraocular lens  implant, bilateral    . Repair  intestinal perforation surgery  01-31-2009  . Cardiac catheterization  04-12-2009  DR EVELYNE GOUDREAU ([facility] IN [place], [place])    NON-OBSTRUCTIVE CAD/  mLAD 40%,  oLAD 50%,  dCFX 40%,  OM1  40%,  mRCA  &  dRCA  50%/  LVEF 65% /  ELEVATED  LVEDP  . Transthoracic echocardiogram  01-19-2013  DR NISHAN    SEVERE LVH/  EF 34-19%/  GRADE I DIASTOLIC DYSFUNCTION/  MODERATE LAE/  MILD IMPROVEMENT OF MODERATE CIRCUMFERENTIAL INFERIOROLATERAL PERICARDIAL EFFUSION WITH NO TAMPONADE  . Cardiovascular stress test  11-04-2012  DR NISHAN    HIGH RISK NUCLEAR STUDY/  FIXED DEFECT AFFECTING ENTIRE INFEROLATERAL AND ANTEROLATERAL WALL/  MODERATE ISCHEMIA  AT MID/ APICAL ANTEROR AND INFERIOR WALL/  GLOBAL HYPOKINESIS/  LVEF 28% (FELT TO BE FALSE +,  ECHO & cMRI   NORMAL EF AND NORMAL WM)  . Vaginal hysterectomy  1990's    W/  BILATERAL SALPINGOOPHORECTOMY  . Eye surgery Left 2003    REPAIR CORNEA  . Skin split graft Left 03/29/2013    Procedure: SKIN GRAFT SPLIT THICKNESS WITH A-CELL AND VAC TO LEFT THIGH;  Surgeon: Theodoro Kos, DO;  Location: [place];  Service: Plastics;  Laterality: Left;    Social History:  reports that she has never smoked. She has never used smokeless tobacco. She reports that she does not drink alcohol or use illicit drugs.  Family History:  Family History  Problem Relation Age of Onset  . Kidney disease Neg Hx   . Arthritis Other     parent  . Heart disease Other     parent  . Hypertension Other  parent  . Diabetes Other     parent  . Diabetes Other     grandparent  . Thyroid disease Other     several  . Heart disease Father      Prior to Admission medications   Medication Sig Start Date End Date Taking? Authorizing Provider  albuterol (ACCUNEB) 0.63 MG/3ML nebulizer solution Take 1 ampule by nebulization every 6 (six) hours as needed for wheezing.   Yes Historical Provider, MD  albuterol (PROVENTIL HFA;VENTOLIN HFA) 108 (90 BASE) MCG/ACT inhaler Inhale 2 puffs into the lungs every 6 (six) hours as needed for wheezing or shortness of breath. 01/04/14  Yes Biagio Borg, MD  amLODipine (NORVASC) 10 MG tablet TAKE 1 TABLET BY MOUTH EVERY DAY Patient taking differently: TAKE 1/2 TABLET BY MOUTH EVERY DAY 10/18/13  Yes Biagio Borg, MD  aspirin EC 81 MG tablet Take 81 mg by mouth daily.   Yes Historical Provider, MD  azaTHIOprine (IMURAN) 50 MG tablet Take 75 mg by mouth daily.    Yes Historical Provider, MD  brimonidine (ALPHAGAN) 0.2 % ophthalmic solution Place 1 drop into the right eye 2 (two) times daily.  03/10/13  Yes Historical Provider, MD  cinacalcet (SENSIPAR) 30 MG tablet Take 30 mg by mouth every other day.  04/09/12  Yes Biagio Borg, MD  Docusate Sodium POWD 1 Package by Does not  apply route daily. Puts in coffee   Yes Historical Provider, MD  fluticasone (FLONASE) 50 MCG/ACT nasal spray Place 2 sprays into both nostrils daily. 03/30/14  Yes Biagio Borg, MD  furosemide (LASIX) 80 MG tablet Take 160 mg by mouth 2 (two) times daily.    Yes Historical Provider, MD  insulin NPH Human (HUMULIN N,NOVOLIN N) 100 UNIT/ML injection Inject 48 units under the skin as advised Patient taking differently: Inject 20 Units into the skin 3 (three) times daily with meals. Inject 48 units under the skin as advised 12/28/13  Yes Philemon Kingdom, MD  insulin regular (NOVOLIN R,HUMULIN R) 100 units/mL injection Inject up to 60 units under the skin as advised Patient taking differently: Inject 10-50 Units into the skin 3 (three) times daily before meals. Sliding Scale based on blood glucose 12/28/13  Yes Philemon Kingdom, MD  isosorbide mononitrate (IMDUR) 60 MG 24 hr tablet TAKE 1 TABLET BY MOUTH EVERY DAY 01/21/14  Yes Biagio Borg, MD  KLOR-CON M20 20 MEQ tablet Takes 1/2 tab in morning and 1/2 at night. 02/11/13  Yes Historical Provider, MD  levothyroxine (SYNTHROID, LEVOTHROID) 75 MCG tablet Take 1 tablet (75 mcg total) by mouth daily. 03/25/13  Yes Biagio Borg, MD  magnesium oxide (MAG-OX) 400 MG tablet Take 400 mg by mouth 2 (two) times daily.   Yes Historical Provider, MD  metoprolol (LOPRESSOR) 100 MG tablet TAKE 1 TABLET BY MOUTH TWICE DAILY 02/01/14  Yes Josue Hector, MD  minoxidil (LONITEN) 2.5 MG tablet Take 1 tablet (2.5 mg total) by mouth 2 (two) times daily. 01/31/14  Yes Biagio Borg, MD  nitroGLYCERIN (NITROSTAT) 0.4 MG SL tablet Place 1 tablet (0.4 mg total) under the tongue every 5 (five) minutes as needed for chest pain. 04/13/14  Yes Josue Hector, MD  polyethylene glycol (MIRALAX / GLYCOLAX) packet Take 17 g by mouth daily.  09/29/13  Yes Historical Provider, MD  pravastatin (PRAVACHOL) 20 MG tablet Take 10 mg by mouth at bedtime.     Yes Historical Provider, MD  prednisoLONE  acetate (PRED FORTE)  1 % ophthalmic suspension Place 1 drop into the left eye 2 (two) times daily.    Yes Historical Provider, MD  predniSONE (DELTASONE) 10 MG tablet Take 10 mg by mouth every morning.    Yes Historical Provider, MD  tacrolimus (PROGRAF) 1 MG capsule Take 9 mg by mouth 2 (two) times daily.    Yes Historical Provider, MD  timolol (TIMOPTIC) 0.25 % ophthalmic solution Place 1 drop into the right eye 2 (two) times daily.   Yes Historical Provider, MD  diclofenac sodium (VOLTAREN) 1 % GEL Apply 4 g topically 4 (four) times daily as needed. 03/30/14   Biagio Borg, MD  feeding supplement, GLUCERNA SHAKE, (GLUCERNA SHAKE) LIQD Take 237 mLs by mouth 2 (two) times daily between meals. 02/23/13   Hosie Poisson, MD  HYDROcodone-acetaminophen (NORCO/VICODIN) 5-325 MG per tablet Take 1 tablet by mouth every 4 (four) hours as needed. Patient not taking: Reported on 04/24/2014 01/29/14   Katheren Shams, MD    Physical Exam: Filed Vitals:   04/24/14 2215 04/24/14 2245 04/25/14 0032 04/25/14 0047  BP: 150/68 120/55 160/50   Pulse:  70 79   Temp:   98.4 F (36.9 C)   TempSrc:   Oral   Resp:   18   Height:   5\' 5"  (1.651 m)   Weight:   89.767 kg (197 lb 14.4 oz)   SpO2:  94% 100% 100%   General: Not in acute distress HEENT:       Eyes: PERRL, EOMI, no scleral icterus       ENT: No discharge from the ears and nose, no pharynx injection, no tonsillar enlargement.        Neck: Positive JVD, no bruit, no mass felt. Cardiac: S1/S2, RRR, No murmurs, No gallops or rubs Pulm: Good air movement bilaterally. Clear to auscultation bilaterally. No rales, wheezing, rhonchi or rubs. Abd: Soft, nondistended, nontender, no rebound pain, no organomegaly, BS present Ext: 1+ pitting leg edema bilaterally. 2+DP/PT pulse bilaterally. There is a small abscess over left upper arm medially, s/p of I&D, no active draining. Musculoskeletal: No joint deformities, erythema, or stiffness, ROM full Skin: No rashes.   Neuro: Alert and oriented X3, cranial nerves II-XII grossly intact, muscle strength 5/5 in all extremeties, sensation to light touch intact. Psych: Patient is not psychotic, no suicidal or hemocidal ideation.  Labs on Admission:  Basic Metabolic Panel:  Recent Labs Lab 04/24/14 1838  NA 138  K 5.2*  CL 100  CO2 34*  GLUCOSE 163*  BUN 26*  CREATININE 1.84*  CALCIUM 9.4   Liver Function Tests: No results for input(s): AST, ALT, ALKPHOS, BILITOT, PROT, ALBUMIN in the last 168 hours. No results for input(s): LIPASE, AMYLASE in the last 168 hours. No results for input(s): AMMONIA in the last 168 hours. CBC:  Recent Labs Lab 04/24/14 1838  WBC 4.8  HGB 12.3  HCT 40.4  MCV 92.4  PLT 228   Cardiac Enzymes: No results for input(s): CKTOTAL, CKMB, CKMBINDEX, TROPONINI in the last 168 hours.  BNP (last 3 results)  Recent Labs  04/24/14 1838  BNP 571.8*    ProBNP (last 3 results) No results for input(s): PROBNP in the last 8760 hours.  CBG:  Recent Labs Lab 04/24/14 2113  GLUCAP 147*    Radiological Exams on Admission: Dg Chest 2 View  04/24/2014   CLINICAL DATA:  Shortness of breath for 6 days. History of congestive heart failure. History of hypertension, diabetes and asthma.  EXAM:  CHEST  2 VIEW  COMPARISON:  11/30/2012.  FINDINGS: Right subclavian Port-A-Cath tip appears unchanged in the mid SVC. There is stable cardiomegaly and chronic vascular congestion. Mild atelectasis is present at both lung bases. There is no confluent airspace opacity or significant pleural effusion. The bones appear unchanged.  IMPRESSION: Stable cardiomegaly and chronic vascular congestion. No acute findings.   Electronically Signed   By: Richardean Sale M.D.   On: 04/24/2014 20:49    EKG: Independently reviewed. T-wave inversion in inferior leads and V3 to V6, which existed in previous EKG on 11/16/13. QTc interval 491  Assessment/Plan Principal Problem:   Acute on chronic diastolic  heart failure Active Problems:   Type 2 diabetes mellitus with circulatory disorder   Coronary atherosclerosis   Asthma   GERD   Peptic ulcer   Gastroparesis   KIDNEY TRANSPLANTATION   Hypothyroidism   Essential hypertension, benign   CKD (chronic kidney disease)   OSA (obstructive sleep apnea)   Abscess of arm, right   Diarrhea  CHF exacerbation: 2-D echo on 08/06/13 showed EF 75% with grade 1 diastolic dysfunction. Echo also showed infiltration change. Patient presents with shortness of breath, worsening leg edema, elevated BNP, all are consistent with congestive heart failure exacerbation. -will admit to tele bed -will treat with IV lasix 120 mg bid -ASA , metoprolol -will cycle CE X3 -will get 2-D echo to evaluate EF -strict In/Out -Daily body weight. -heart diet -No on ACEI because of CKD  Hyperkalemia: Potassium of 5.2. No EKG changes. -Expect to be corrected with  IV Lasix  History of kidney transplantation and AoCKD-III: Baseline creatinine is 1.1 1-1.4. Her creatinine is 1.84 on admission, BUN 26. It is likely related to Bactrim use. Patient is currently taking Bactrim for her left arm abscess.  -will switch Bactrim to doxycycline for two more days. -continue Imuran, prednisone, tacrolimus and check tacrolimus level -check FeUrea -check US-renal  Diabetes mellitus: Patient is using NPH insulin at home. A1c 9.9 on 09/29/13.  -sliding scale insulin  Diarrhea: Because of the Bactrim use, is to rule out C diff -check C diff pcr and GI pathogen panel  OSA: -CPAP  Asthma: Stable. No signs of acute exacerbation -Continue albuterol  Hypothyroidism: TSH was  4.00 on 09/29/13.  -continue Synthroid  Hyperlipidemia: LDL was 68 on 09/04/10. -Continue pravastatin  Hypertension:  -on Lasix -Continue amlodipine,minoxidil  Left arm abscess: Small abscess, post status of I&D.  -doxycyline for two more days -consult to wound care team   DVT ppx: SQ Heparin   Code  Status: Full code Family Communication: None at bed side.  Disposition Plan: Admit to inpatient   Date of Service 04/25/2014    Ivor Costa [facility] Pager [phone]  If 7PM-7AM, please contact night-coverage [email or web address] Password [email or web address] 04/25/2014, 1:54 AM

## 2014-04-25 ENCOUNTER — Other Ambulatory Visit: Payer: Self-pay | Admitting: Internal Medicine

## 2014-04-25 ENCOUNTER — Encounter (HOSPITAL_COMMUNITY): Payer: Self-pay | Admitting: Surgery

## 2014-04-25 DIAGNOSIS — N179 Acute kidney failure, unspecified: Secondary | ICD-10-CM

## 2014-04-25 DIAGNOSIS — R197 Diarrhea, unspecified: Secondary | ICD-10-CM | POA: Diagnosis present

## 2014-04-25 LAB — BASIC METABOLIC PANEL
Anion gap: 10 (ref 5–15)
BUN: 23 mg/dL (ref 6–23)
CO2: 31 mmol/L (ref 19–32)
CREATININE: 1.62 mg/dL — AB (ref 0.50–1.10)
Calcium: 9.2 mg/dL (ref 8.4–10.5)
Chloride: 98 mmol/L (ref 96–112)
GFR calc Af Amer: 38 mL/min — ABNORMAL LOW (ref >90)
GFR calc non Af Amer: 33 mL/min — ABNORMAL LOW (ref >90)
GLUCOSE: 243 mg/dL — AB (ref 70–99)
POTASSIUM: 4.2 mmol/L (ref 3.5–5.1)
Sodium: 139 mmol/L (ref 135–145)

## 2014-04-25 LAB — GLUCOSE, CAPILLARY
GLUCOSE-CAPILLARY: 236 mg/dL — AB (ref 70–99)
Glucose-Capillary: 178 mg/dL — ABNORMAL HIGH (ref 70–99)
Glucose-Capillary: 191 mg/dL — ABNORMAL HIGH (ref 70–99)
Glucose-Capillary: 294 mg/dL — ABNORMAL HIGH (ref 70–99)
Glucose-Capillary: 231 mg/dL — ABNORMAL HIGH (ref 70–99)

## 2014-04-25 LAB — CLOSTRIDIUM DIFFICILE BY PCR: Toxigenic C. Difficile by PCR: NEGATIVE

## 2014-04-25 LAB — CREATININE, URINE, RANDOM: CREATININE, URINE: 94.86 mg/dL

## 2014-04-25 LAB — PROTIME-INR
INR: 1.04 (ref 0.00–1.49)
Prothrombin Time: 13.7 seconds (ref 11.6–15.2)

## 2014-04-25 LAB — RAPID URINE DRUG SCREEN, HOSP PERFORMED
AMPHETAMINES: NOT DETECTED
BENZODIAZEPINES: NOT DETECTED
Barbiturates: NOT DETECTED
Cocaine: NOT DETECTED
OPIATES: NOT DETECTED
Tetrahydrocannabinol: NOT DETECTED

## 2014-04-25 LAB — MAGNESIUM: Magnesium: 2.1 mg/dL (ref 1.5–2.5)

## 2014-04-25 LAB — TROPONIN I
TROPONIN I: 0.07 ng/mL — AB (ref <0.031)
Troponin I: 0.07 ng/mL — ABNORMAL HIGH (ref <0.031)
Troponin I: 0.06 ng/mL — ABNORMAL HIGH (ref <0.031)

## 2014-04-25 LAB — TSH: TSH: 19.709 u[IU]/mL — ABNORMAL HIGH (ref 0.350–4.500)

## 2014-04-25 MED ORDER — BRIMONIDINE TARTRATE 0.2 % OP SOLN
1.0000 [drp] | Freq: Two times a day (BID) | OPHTHALMIC | Status: DC
  Administered 2014-04-25 – 2014-04-26 (×4): 1 [drp] via OPHTHALMIC
  Filled 2014-04-25: qty 5

## 2014-04-25 MED ORDER — NITROGLYCERIN 0.4 MG SL SUBL: 0.4000 mg | SUBLINGUAL_TABLET | SUBLINGUAL | Status: DC | PRN

## 2014-04-25 MED ORDER — MAGNESIUM OXIDE 400 (241.3 MG) MG PO TABS
400.0000 mg | ORAL_TABLET | Freq: Two times a day (BID) | ORAL | Status: DC
  Administered 2014-04-25 – 2014-04-26 (×4): 400 mg via ORAL
  Filled 2014-04-25 (×5): qty 1

## 2014-04-25 MED ORDER — MINOXIDIL 2.5 MG PO TABS
2.5000 mg | ORAL_TABLET | Freq: Two times a day (BID) | ORAL | Status: DC
  Administered 2014-04-25 – 2014-04-26 (×4): 2.5 mg via ORAL
  Filled 2014-04-25 (×5): qty 1

## 2014-04-25 MED ORDER — PRAVASTATIN SODIUM 10 MG PO TABS
10.0000 mg | ORAL_TABLET | Freq: Every day | ORAL | Status: DC
  Administered 2014-04-25 (×2): 10 mg via ORAL
  Filled 2014-04-25 (×3): qty 1

## 2014-04-25 MED ORDER — INSULIN ASPART 100 UNIT/ML ~~LOC~~ SOLN
0.0000 [IU] | Freq: Every day | SUBCUTANEOUS | Status: DC
  Administered 2014-04-25: 2 [IU] via SUBCUTANEOUS

## 2014-04-25 MED ORDER — ISOSORBIDE MONONITRATE ER 60 MG PO TB24
60.0000 mg | ORAL_TABLET | Freq: Every day | ORAL | Status: DC
  Administered 2014-04-25 – 2014-04-26 (×2): 60 mg via ORAL
  Filled 2014-04-25 (×2): qty 1

## 2014-04-25 MED ORDER — DOXYCYCLINE HYCLATE 100 MG PO TABS
100.0000 mg | ORAL_TABLET | Freq: Two times a day (BID) | ORAL | Status: AC
Start: 2014-04-25 — End: 2014-04-26
  Administered 2014-04-25 – 2014-04-26 (×4): 100 mg via ORAL
  Filled 2014-04-25 (×4): qty 1

## 2014-04-25 MED ORDER — AMLODIPINE BESYLATE 5 MG PO TABS
5.0000 mg | ORAL_TABLET | Freq: Every day | ORAL | Status: DC
  Administered 2014-04-25 (×2): 5 mg via ORAL
  Filled 2014-04-25 (×3): qty 1

## 2014-04-25 MED ORDER — DOCUSATE SODIUM 100 MG PO CAPS
100.0000 mg | ORAL_CAPSULE | Freq: Two times a day (BID) | ORAL | Status: DC
  Administered 2014-04-25 – 2014-04-26 (×2): 100 mg via ORAL
  Filled 2014-04-25 (×4): qty 1

## 2014-04-25 MED ORDER — HEPARIN SODIUM (PORCINE) 5000 UNIT/ML IJ SOLN
5000.0000 [IU] | Freq: Three times a day (TID) | INTRAMUSCULAR | Status: DC
  Administered 2014-04-25 – 2014-04-26 (×5): 5000 [IU] via SUBCUTANEOUS
  Filled 2014-04-25 (×7): qty 1

## 2014-04-25 MED ORDER — ASPIRIN EC 81 MG PO TBEC
81.0000 mg | DELAYED_RELEASE_TABLET | Freq: Every day | ORAL | Status: DC
  Administered 2014-04-25 – 2014-04-26 (×2): 81 mg via ORAL
  Filled 2014-04-25 (×2): qty 1

## 2014-04-25 MED ORDER — SODIUM CHLORIDE 0.9 % IV SOLN: 250.0000 mL | INTRAVENOUS | Status: DC | PRN

## 2014-04-25 MED ORDER — PREDNISOLONE ACETATE 1 % OP SUSP
1.0000 [drp] | Freq: Two times a day (BID) | OPHTHALMIC | Status: DC
  Administered 2014-04-25 – 2014-04-26 (×4): 1 [drp] via OPHTHALMIC
  Filled 2014-04-25: qty 1

## 2014-04-25 MED ORDER — ALBUTEROL SULFATE (2.5 MG/3ML) 0.083% IN NEBU: 3.0000 mL | INHALATION_SOLUTION | Freq: Four times a day (QID) | RESPIRATORY_TRACT | Status: DC | PRN

## 2014-04-25 MED ORDER — INSULIN GLARGINE 100 UNIT/ML ~~LOC~~ SOLN
30.0000 [IU] | Freq: Every day | SUBCUTANEOUS | Status: DC
  Administered 2014-04-25: 30 [IU] via SUBCUTANEOUS
  Filled 2014-04-25 (×2): qty 0.3

## 2014-04-25 MED ORDER — OXYCODONE HCL 5 MG PO TABS: 5.0000 mg | ORAL_TABLET | ORAL | Status: DC | PRN

## 2014-04-25 MED ORDER — MUPIROCIN CALCIUM 2 % EX CREA
TOPICAL_CREAM | Freq: Every day | CUTANEOUS | Status: DC
  Administered 2014-04-25 – 2014-04-26 (×2): via TOPICAL
  Filled 2014-04-25: qty 15

## 2014-04-25 MED ORDER — DEXTROSE 5 % IV SOLN
120.0000 mg | Freq: Two times a day (BID) | INTRAVENOUS | Status: DC
  Administered 2014-04-25 – 2014-04-26 (×3): 120 mg via INTRAVENOUS
  Filled 2014-04-25 (×5): qty 12

## 2014-04-25 MED ORDER — SODIUM CHLORIDE 0.9 % IJ SOLN
3.0000 mL | Freq: Two times a day (BID) | INTRAMUSCULAR | Status: DC
Start: 2014-04-25 — End: 2014-04-26
  Administered 2014-04-25 – 2014-04-26 (×4): 3 mL via INTRAVENOUS

## 2014-04-25 MED ORDER — LEVOTHYROXINE SODIUM 75 MCG PO TABS
75.0000 ug | ORAL_TABLET | Freq: Every day | ORAL | Status: DC
  Administered 2014-04-25 – 2014-04-26 (×2): 75 ug via ORAL
  Filled 2014-04-25 (×3): qty 1

## 2014-04-25 MED ORDER — TIMOLOL MALEATE 0.25 % OP SOLN
1.0000 [drp] | Freq: Two times a day (BID) | OPHTHALMIC | Status: DC
  Administered 2014-04-25 – 2014-04-26 (×4): 1 [drp] via OPHTHALMIC
  Filled 2014-04-25: qty 5

## 2014-04-25 MED ORDER — TACROLIMUS 1 MG PO CAPS
9.0000 mg | ORAL_CAPSULE | Freq: Two times a day (BID) | ORAL | Status: DC
  Administered 2014-04-25 – 2014-04-26 (×4): 9 mg via ORAL
  Filled 2014-04-25 (×6): qty 9

## 2014-04-25 MED ORDER — FLUTICASONE PROPIONATE 50 MCG/ACT NA SUSP
2.0000 | Freq: Every day | NASAL | Status: DC
  Administered 2014-04-25 – 2014-04-26 (×2): 2 via NASAL
  Filled 2014-04-25 (×2): qty 16

## 2014-04-25 MED ORDER — GLUCERNA SHAKE PO LIQD
237.0000 mL | Freq: Two times a day (BID) | ORAL | Status: DC
  Administered 2014-04-25 – 2014-04-26 (×4): 237 mL via ORAL

## 2014-04-25 MED ORDER — AZATHIOPRINE 50 MG PO TABS
75.0000 mg | ORAL_TABLET | Freq: Every day | ORAL | Status: DC
  Administered 2014-04-25 – 2014-04-26 (×2): 75 mg via ORAL
  Filled 2014-04-25 (×3): qty 2

## 2014-04-25 MED ORDER — INSULIN ASPART 100 UNIT/ML ~~LOC~~ SOLN
10.0000 [IU] | Freq: Three times a day (TID) | SUBCUTANEOUS | Status: DC
  Administered 2014-04-25 – 2014-04-26 (×3): 10 [IU] via SUBCUTANEOUS

## 2014-04-25 MED ORDER — SODIUM CHLORIDE 0.9 % IJ SOLN: 3.0000 mL | INTRAMUSCULAR | Status: DC | PRN

## 2014-04-25 MED ORDER — PREDNISONE 10 MG PO TABS
10.0000 mg | ORAL_TABLET | Freq: Every morning | ORAL | Status: DC
  Administered 2014-04-25 – 2014-04-26 (×2): 10 mg via ORAL
  Filled 2014-04-25 (×2): qty 1

## 2014-04-25 MED ORDER — INSULIN ASPART 100 UNIT/ML ~~LOC~~ SOLN: 0.0000 [IU] | Freq: Three times a day (TID) | SUBCUTANEOUS | Status: DC

## 2014-04-25 MED ORDER — ONDANSETRON HCL 4 MG/2ML IJ SOLN: 4.0000 mg | Freq: Four times a day (QID) | INTRAMUSCULAR | Status: DC | PRN

## 2014-04-25 MED ORDER — INSULIN ASPART 100 UNIT/ML ~~LOC~~ SOLN
0.0000 [IU] | Freq: Three times a day (TID) | SUBCUTANEOUS | Status: DC
  Administered 2014-04-25: 4 [IU] via SUBCUTANEOUS
  Administered 2014-04-26: 11 [IU] via SUBCUTANEOUS
  Administered 2014-04-26: 7 [IU] via SUBCUTANEOUS

## 2014-04-25 MED ORDER — METOPROLOL TARTRATE 100 MG PO TABS
100.0000 mg | ORAL_TABLET | Freq: Two times a day (BID) | ORAL | Status: DC
  Administered 2014-04-25 – 2014-04-26 (×4): 100 mg via ORAL
  Filled 2014-04-25 (×7): qty 1

## 2014-04-25 MED ORDER — CINACALCET HCL 30 MG PO TABS
30.0000 mg | ORAL_TABLET | ORAL | Status: DC
  Administered 2014-04-25: 30 mg via ORAL
  Filled 2014-04-25 (×3): qty 1

## 2014-04-25 NOTE — Progress Notes (Signed)
Patient tranferred from ED via stretcher to room 3E13. Oriented and educated patient to the Heart Failure floor and room equipment. Provided Heart Failure folder and explained information. Notified CMT of patient being on telemetry monitor and confirmed placement of leads on patient. VSS and currently patient does not complain of any pain, dyspnea, or discomfort. Will continue to monitor patient to end of shift.

## 2014-04-25 NOTE — Care Management Note (Unsigned)
    Page 1 of 1   04/25/2014     3:45:45 PM CARE MANAGEMENT NOTE 04/25/2014  Patient:  Monica Lee, Monica Lee   Account Number:  [id number]  Date Initiated:  04/25/2014  Documentation initiated by:  Marlei Glomski  Subjective/Objective Assessment:   Pt adm on 04/24/14 with CHF exacerbation.  PTA, pt independent, lives with spouse.     Action/Plan:   Will follow for dc needs as pt progresses.  PT eval today; no OP follow up recommended.   Anticipated DC Date:  04/28/2014   Anticipated DC Plan:  [place]  CM consult      Choice offered to / List presented to:             Status of service:  In process, will continue to follow Medicare Important Message given?   (If response is "NO", the following Medicare IM given date fields will be blank) Date Medicare IM given:   Medicare IM given by:   Date Additional Medicare IM given:   Additional Medicare IM given by:    Discharge Disposition:    Per UR Regulation:  Reviewed for med. necessity/level of care/duration of stay  If discussed at [place] of Stay Meetings, dates discussed:    Comments:

## 2014-04-25 NOTE — Consult Note (Signed)
WOC wound consult note Reason for Consult: Consult requested for left arm.  Pt states she had an I&D performed at another hospital several weeks ago and the site has improved. Wound type: Full thickness Measurement: Open wound is .3X.3X.4cm, beefy red, surrounded by erythremia and hard to palpation to 1 cm surrounding. Drainage (amount, consistency, odor) Scant amt yellow drainage when probed, no odor,  fluctuance or discomfort.   Dressing procedure/placement/frequency: No significant depth to pack at this time. Bactroban to provide antimicrobial benefits and promote healing.  Discussed plan of care with patient and daughter at bedside, they verbalize understanding. Please re-consult if further assistance is needed.  Thank-you,  Julien Girt MSN, [place], [place], [place], [place]

## 2014-04-25 NOTE — Progress Notes (Signed)
CPAP setup and ready for pt to wear tonight. Pt is waiting for meds from RN. RN stated she would call once meds were give to pt.

## 2014-04-25 NOTE — Plan of Care (Signed)
Problem: Phase I Progression Outcomes Goal: Dyspnea controlled at rest (HF) Outcome: Completed/Met Date Met:  04/25/14 Dyspnea with activity

## 2014-04-25 NOTE — Progress Notes (Signed)
[facility] PROGRESS NOTE  Westmont COHICK BHA:[id number] DOB: [birth year] DOA: 04/24/2014 PCP: [name], MD   Brief narrative 64 year old female with history of  cadaveric kidney transplant in 2008, immunosuppression, hypertension, hyperlipidemia, diastolic CHF, asthma, coronary artery disease, history of stroke, uncontrolled diabetes mellitus who presented with 1 week off progressive shortness of breath and leg edema with weight gain. Patient recently had a small abscess of left upper which was drained in [place] ( where she lives)  and placed on Bactrim. Patient found to have acute diastolic CHF and admitted to telemetry.   Assessment/Plan: Acute on chronic diastolic CHF Possibly triggered by recent abscess. Placed on IV Lasix 120 mg twice daily. Monitor strict I/O and daily weight. Continue aspirin and metoprolol. Will repeat 2-D echo. Not on ACE inhibitor due to chronic kidney disease.  Acute on chronic kidney disease stage III with history of kidney transplant Patient baseline creatinine of 1.1-1.4. Renal function worsened on admission 1.84 which is possibly due to Bactrim. Creatinine slightly improved this morning. Switched Bactrim to doxycycline for next 2 days.  Hyperkalemia Potassium of 5.2 without any EKG changes. Improved with Lasix.   Uncontrolled type 2 diabetes mellitus Last A1c from September 2015 of 9.9. Sees Dr Cruzita Lederer Will place her here on lantus 30 units at bedtime and premeal aspart 10 units tid. Add Sliding scale coverage.  Elevated troponin level Mild. Likely demand ischemia with underlying acute CHF. Denies any chest pain symptoms.  History of kidney transplant Continue home immunosuppressants. Check tacrolimus level.  Hypertension Resume home medication.  Severe obstructive sleep apnea Patient non adherent to CPAP. Encouraged on compliance.   Code Status: full code Family Communication: daughter at bedside Disposition Plan: Continue inpatient  monitoring. Home once symptoms improve.   Consultants:  None  Procedures:  2D echo pending  Antibiotics:  doxycycline  HPI/Subjective: Recent seen and examined. Reports her shortness of breath to be better this morning. Denies any chest pain.  Objective: Filed Vitals:   04/25/14 1114  BP:   Pulse: 79  Temp:   Resp:     Intake/Output Summary (Last 24 hours) at 04/25/14 1134 Last data filed at 04/25/14 0828  Gross per 24 hour  Intake    665 ml  Output   2425 ml  Net  -1760 ml   Filed Weights   04/24/14 1830 04/25/14 0032  Weight: 90.719 kg (200 lb) 89.767 kg (197 lb 14.4 oz)    Exam:   General:  Elderly obese female in no acute distress  HEENT: No pallor, moist oral mucosa, mild JVD  Cardiovascular: Normal S1 and S2, no murmurs rub or gallop  Chest: Fine bibasilar crackles, no rhonchi or wheeze  GI: Soft, nondistended, nontender, bowel sounds present  musculoskeletal: Warm, 1+ pitting edema  CNS: Alert and oriented   Data Reviewed: Basic Metabolic Panel:  Recent Labs Lab 04/24/14 1838 04/25/14 0807  NA 138 139  K 5.2* 4.2  CL 100 98  CO2 34* 31  GLUCOSE 163* 243*  BUN 26* 23  CREATININE 1.84* 1.62*  CALCIUM 9.4 9.2  MG  --  2.1   Liver Function Tests: No results for input(s): AST, ALT, ALKPHOS, BILITOT, PROT, ALBUMIN in the last 168 hours. No results for input(s): LIPASE, AMYLASE in the last 168 hours. No results for input(s): AMMONIA in the last 168 hours. CBC:  Recent Labs Lab 04/24/14 1838  WBC 4.8  HGB 12.3  HCT 40.4  MCV 92.4  PLT 228   Cardiac Enzymes:  Recent Labs Lab 04/25/14 0120 04/25/14 0807  TROPONINI 0.07* 0.07*   BNP (last 3 results)  Recent Labs  04/24/14 1838  BNP 571.8*    ProBNP (last 3 results) No results for input(s): PROBNP in the last 8760 hours.  CBG:  Recent Labs Lab 04/24/14 2113 04/25/14 0048 04/25/14 0853  GLUCAP 147* 178* 236*    Recent Results (from the past 240 hour(s))   Clostridium Difficile by PCR     Status: None   Collection Time: 04/25/14  8:10 AM  Result Value Ref Range Status   C difficile by pcr NEGATIVE NEGATIVE Final     Studies: Dg Chest 2 View  04/24/2014   CLINICAL DATA:  Shortness of breath for 6 days. History of congestive heart failure. History of hypertension, diabetes and asthma.  EXAM: CHEST  2 VIEW  COMPARISON:  11/30/2012.  FINDINGS: Right subclavian Port-A-Cath tip appears unchanged in the mid SVC. There is stable cardiomegaly and chronic vascular congestion. Mild atelectasis is present at both lung bases. There is no confluent airspace opacity or significant pleural effusion. The bones appear unchanged.  IMPRESSION: Stable cardiomegaly and chronic vascular congestion. No acute findings.   Electronically Signed   By: Richardean Sale M.D.   On: 04/24/2014 20:49    Scheduled Meds: . amLODipine  5 mg Oral Daily  . aspirin EC  81 mg Oral Daily  . azaTHIOprine  75 mg Oral Daily  . brimonidine  1 drop Right Eye BID  . cinacalcet  30 mg Oral Q48H  . doxycycline  100 mg Oral Q12H  . feeding supplement (GLUCERNA SHAKE)  237 mL Oral BID BM  . fluticasone  2 spray Each Nare Daily  . furosemide  120 mg Intravenous Q12H  . heparin  5,000 Units Subcutaneous 3 times per day  . insulin aspart  0-9 Units Subcutaneous TID WC  . isosorbide mononitrate  60 mg Oral Daily  . levothyroxine  75 mcg Oral QAC breakfast  . magnesium oxide  400 mg Oral BID  . metoprolol  100 mg Oral BID  . minoxidil  2.5 mg Oral BID  . mupirocin cream   Topical Daily  . pravastatin  10 mg Oral QHS  . prednisoLONE acetate  1 drop Left Eye BID  . predniSONE  10 mg Oral q morning - 10a  . sodium chloride  3 mL Intravenous Q12H  . tacrolimus  9 mg Oral BID  . timolol  1 drop Right Eye BID   Continuous Infusions:     Time spent: 25 minutes    Darien Kading  [facility] Pager [phone] If 7PM-7AM, please contact night-coverage at [email or web address], password  [facility] 04/25/2014, 11:34 AM  LOS: 1 day

## 2014-04-25 NOTE — Evaluation (Signed)
Physical Therapy Evaluation Patient Details Name: Monica Lee MRN: [id number] DOB: October 29, 1950 Today's Date: 04/25/2014   History of Present Illness  64 y.o. female admitted to [facility] on 04/24/14 with SOB.  Pt dx with CHF exacerbation and hyperkalemia.  Pt with significant PMHx of blind in L eye, chronic diastolic CHF, MI, CVA, DM 2, breast CA, CKD III, L thigh hematoma s/p surgery for hematoma and later for infection, CAD, pericardia effusion, anemia, trigeminal neuralgia, R foot surgery, and skin graft right thigh to left thigh.  Pt also reports (as does wound care RN in chart) active left arm abcess/wound.  Clinical Impression  Pt reports her breathing feels much better today. HR and O2 sats stable on RA with gait.  She is at her normal mobility baseline (walking with a cane).  PT to sign off.  Cane provided for in-hospital use and gait encouraged with staff and/or family.   Follow Up Recommendations No PT follow up    Equipment Recommendations  None recommended by PT    Recommendations for Other Services   NA    Precautions / Restrictions Precautions Precautions: Fall Precaution Comments: due to h/o falls at home      Mobility  Bed Mobility Overal bed mobility: Independent                Transfers Overall transfer level: Independent Equipment used: None (pt pushing dynamap to simulate cane)                Ambulation/Gait Ambulation/Gait assistance: Supervision Ambulation Distance (Feet): 180 Feet Assistive device: None (pushing dynamap to simulate cane) Gait Pattern/deviations: Step-through pattern (with fatigue left leg gave x 1 with supervision)     General Gait Details: Pt able to compensate with upper extremity support simulating cane for left leg weakness during gait.  She reports this is her baseline.        Balance Overall balance assessment: Needs assistance Sitting-balance support: Feet supported;No upper extremity supported Sitting balance-Leahy  Scale: Good     Standing balance support: Single extremity supported Standing balance-Leahy Scale: Good                               Pertinent Vitals/Pain Pain Assessment: No/denies pain    Home Living Family/patient expects to be discharged to:: Private residence Living Arrangements: Spouse/significant other Available Help at Discharge: Family;Available PRN/intermittently Type of Home: House Home Access: Stairs to enter     Home Layout: Two level Home Equipment: Environmental consultant - 2 wheels;Cane - single point      Prior Function Level of Independence: Independent with assistive device(s)         Comments: pt uses a cane at baseline due to residual left leg weakness after multiple surgeries.         Extremity/Trunk Assessment   Upper Extremity Assessment: Overall WFL for tasks assessed           Lower Extremity Assessment: LLE deficits/detail   LLE Deficits / Details: left leg showed some signs of functional weakness as pt fatigued during gait, but she compensated well with simulated cane (pushing pulse ox machine) to stabilize. Pt reports this is her baseline.   Cervical / Trunk Assessment: Normal  Communication   Communication: No difficulties  Cognition Arousal/Alertness: Awake/alert Behavior During Therapy: WFL for tasks assessed/performed Overall Cognitive Status: Within Functional Limits for tasks assessed  General Comments General comments (skin integrity, edema, etc.): Pt HR stayed in the 70s during gait and O2 sats were 100%.  Therapist encouraged pt to sit up OOB for lunch and walk again with staff or daughter later today.  Kasandra Knudsen will be provided for her to use.           Assessment/Plan    PT Assessment Patent does not need any further PT services  PT Diagnosis Difficulty walking;Abnormality of gait;Generalized weakness   PT Problem List    PT Treatment Interventions     PT Goals (Current goals can be found  in the Care Plan section) Acute Rehab PT Goals PT Goal Formulation: All assessment and education complete, DC therapy     End of Session   Activity Tolerance: Patient tolerated treatment well Patient left: in bed;with call bell/phone within reach;with family/visitor present Nurse Communication: Mobility status         Time: 1027-2536 PT Time Calculation (min) (ACUTE ONLY): 19 min   Charges:   PT Evaluation $Initial PT Evaluation Tier I: 1 Procedure          Malayna Noori B. [place], [place], DPT [phone]   04/25/2014, 11:24 AM

## 2014-04-26 DIAGNOSIS — I509 Heart failure, unspecified: Secondary | ICD-10-CM

## 2014-04-26 DIAGNOSIS — N189 Chronic kidney disease, unspecified: Secondary | ICD-10-CM

## 2014-04-26 LAB — BASIC METABOLIC PANEL
ANION GAP: 7 (ref 5–15)
BUN: 28 mg/dL — ABNORMAL HIGH (ref 6–23)
CALCIUM: 8.7 mg/dL (ref 8.4–10.5)
CO2: 33 mmol/L — ABNORMAL HIGH (ref 19–32)
Chloride: 98 mmol/L (ref 96–112)
Creatinine, Ser: 1.35 mg/dL — ABNORMAL HIGH (ref 0.50–1.10)
GFR calc Af Amer: 47 mL/min — ABNORMAL LOW (ref >90)
GFR, EST NON AFRICAN AMERICAN: 41 mL/min — AB (ref >90)
GLUCOSE: 253 mg/dL — AB (ref 70–99)
POTASSIUM: 3.9 mmol/L (ref 3.5–5.1)
Sodium: 138 mmol/L (ref 135–145)

## 2014-04-26 LAB — GLUCOSE, CAPILLARY
Glucose-Capillary: 211 mg/dL — ABNORMAL HIGH (ref 70–99)
Glucose-Capillary: 92 mg/dL (ref 70–99)
Glucose-Capillary: 256 mg/dL — ABNORMAL HIGH (ref 70–99)

## 2014-04-26 LAB — UREA NITROGEN, URINE: Urea Nitrogen, Ur: 616 mg/dL

## 2014-04-26 LAB — TACROLIMUS LEVEL: Tacrolimus (FK506) - LabCorp: 3.2 ng/mL (ref 2.0–20.0)

## 2014-04-26 MED ORDER — LIVING WELL WITH DIABETES BOOK
Freq: Once | Status: AC
  Administered 2014-04-26: 13:00:00
  Filled 2014-04-26: qty 1

## 2014-04-26 MED ORDER — INSULIN REGULAR HUMAN 100 UNIT/ML IJ SOLN: 10.0000 [IU] | Freq: Three times a day (TID) | INTRAMUSCULAR | Status: DC

## 2014-04-26 MED ORDER — ACETAMINOPHEN 325 MG PO TABS
650.0000 mg | ORAL_TABLET | Freq: Once | ORAL | Status: AC
  Administered 2014-04-26: 650 mg via ORAL
  Filled 2014-04-26: qty 2

## 2014-04-26 MED ORDER — INSULIN NPH (HUMAN) (ISOPHANE) 100 UNIT/ML ~~LOC~~ SUSP: 20.0000 [IU] | Freq: Two times a day (BID) | SUBCUTANEOUS | Status: DC

## 2014-04-26 MED ORDER — INSULIN NPH (HUMAN) (ISOPHANE) 100 UNIT/ML ~~LOC~~ SUSP: 20.0000 [IU] | Freq: Three times a day (TID) | SUBCUTANEOUS | Status: DC

## 2014-04-26 NOTE — Discharge Summary (Addendum)
Physician Discharge Summary  Monica Lee KGM:[id number] DOB: [birth year] DOA: 04/24/2014  PCP: [name], MD  Admit date: 04/24/2014 Discharge date: 04/26/2014  Time spent: 35 minutes  Recommendations for Outpatient Follow-up:  1. D/c home with outp PCP and cardiology follow up  Discharge Diagnoses:  Principal Problem:   Acute on chronic diastolic heart failure  Active Problems:   Type 2 diabetes mellitus with circulatory disorder   Abscess of arm, right   Coronary atherosclerosis   Asthma   GERD   Gastroparesis   KIDNEY TRANSPLANTATION   Hypothyroidism   Essential hypertension, benign   CKD (chronic kidney disease)   OSA (obstructive sleep apnea)   Diarrhea   Discharge Condition: fair  Diet recommendation: heart healthy/ diabetic  Filed Weights   04/24/14 1830 04/25/14 0032 04/26/14 0447  Weight: 90.719 kg (200 lb) 89.767 kg (197 lb 14.4 oz) 88.361 kg (194 lb 12.8 oz)    History of present illness:  64 year old female with history of cadaveric kidney transplant in 2008, immunosuppression, hypertension, hyperlipidemia, diastolic CHF, asthma, coronary artery disease, history of stroke, uncontrolled diabetes mellitus who presented with 1 week off progressive shortness of breath and leg edema with weight gain. Patient recently had a small abscess of left upper which was drained in [place] ( where she lives) and placed on Bactrim. Patient found to have acute diastolic CHF and admitted to telemetry.  Hospital Course:  Acute on chronic diastolic CHF Possibly triggered by recent abscess. Placed on IV Lasix 120 mg twice daily. Diuresed 3.6L since admission. clinically much improved with no further symptoms.  Continue aspirin and metoprolol. Will repeat 2-D echo with EF of 55-60% with no WMA. Grade 1 diastolic dysfunction. Not on ACE inhibitor due to chronic kidney disease. Continue  imdur and resume home dose lasix. Follow up at heart failure clinic as scheduled. Patient  and daughter instructed on diet adherence, medication compliance and monitoring daily weight. Heart failure instruction provided.   Acute on chronic kidney disease stage III with history of kidney transplant Patient baseline creatinine of 1.1-1.4. Renal function worsened on admission 1.84 which is possibly due to Bactrim.. Switched Bactrim to doxycycline and has completed her courser of abx. Renal function improved.  Hyperkalemia Potassium of 5.2 without any EKG changes. Improved with Lasix.   Uncontrolled type 2 diabetes mellitus Last A1c from September 2015 of 9.9. Sees Dr Cruzita Lederer On novolin 20 units bid and humulin SSI at home which should be continued.   Elevated troponin level Mild. Likely demand ischemia with underlying acute CHF. no chest pain symptoms. 2d echo without wall motion abnormalities.  History of kidney transplant Continue home immunosuppressants.   Hypertension Resume home medication.  Severe obstructive sleep apnea Patient non adherent to CPAP. Encouraged on compliance.   Patent stable for discharge  home with outpt follow up  Code Status: full code  Family Communication: daughter at bedside  Disposition Plan: home with outpt follow up   Consultants:  None  Procedures:  2D echo   Antibiotics:  Doxycycline x 4 dose  Discharge Exam: Filed Vitals:   04/26/14 1445  BP: 139/52  Pulse: 66  Temp: 97.9 F (36.6 C)  Resp: 20    General: Elderly obese female in no acute distress  HEENT: No pallor, moist oral mucosa, no JVD  Cardiovascular: Normal S1 and S2, no murmurs rub or gallop  Chest:  Clear b/l, no rhonchi or wheeze  GI: Soft, nondistended, nontender, bowel sounds present  musculoskeletal: Warm,  Edema resolved,  left UE abscess site  Healing, no discharge  CNS: Alert and oriented   Discharge Instructions   Discharge Instructions    Ambulatory referral to Nutrition and Diabetic Education    Complete by:  As directed            Current Discharge Medication List    CONTINUE these medications which have CHANGED   Details  insulin NPH Human (HUMULIN N,NOVOLIN N) 100 UNIT/ML injection Inject 0.2 mLs (20 Units total) into the skin 2 (two) times daily with meals. Qty: 20 mL, Refills: 2    insulin regular (NOVOLIN R,HUMULIN R) 100 units/mL injection Inject 0.1-0.5 mLs (10-50 Units total) into the skin 3 (three) times daily before meals. Sliding Scale based on blood glucose Qty: 20 mL, Refills: 2      CONTINUE these medications which have NOT CHANGED   Details  albuterol (ACCUNEB) 0.63 MG/3ML nebulizer solution Take 1 ampule by nebulization every 6 (six) hours as needed for wheezing.    albuterol (PROVENTIL HFA;VENTOLIN HFA) 108 (90 BASE) MCG/ACT inhaler Inhale 2 puffs into the lungs every 6 (six) hours as needed for wheezing or shortness of breath. Qty: 1 Inhaler, Refills: 11    aspirin EC 81 MG tablet Take 81 mg by mouth daily.    azaTHIOprine (IMURAN) 50 MG tablet Take 75 mg by mouth daily.     brimonidine (ALPHAGAN) 0.2 % ophthalmic solution Place 1 drop into the right eye 2 (two) times daily.    Associated Diagnoses: Breast cancer of lower-outer quadrant of right female breast    cinacalcet (SENSIPAR) 30 MG tablet Take 30 mg by mouth every other day.     docusate sodium (COLACE) 50 MG capsule Take 200 mg by mouth 2 (two) times daily.    fluticasone (FLONASE) 50 MCG/ACT nasal spray Place 2 sprays into both nostrils daily. Qty: 16 g, Refills: 2    furosemide (LASIX) 80 MG tablet Take 160 mg by mouth 2 (two) times daily.     isosorbide mononitrate (IMDUR) 60 MG 24 hr tablet TAKE 1 TABLET BY MOUTH EVERY DAY Qty: 90 tablet, Refills: 3    KLOR-CON M20 20 MEQ tablet Takes 1/2 tab in morning and 1/2 at night.   Associated Diagnoses: Breast cancer of lower-outer quadrant of right female breast    levothyroxine (SYNTHROID, LEVOTHROID) 75 MCG tablet Take 1 tablet (75 mcg total) by mouth daily. Qty: 90  tablet, Refills: 3    magnesium oxide (MAG-OX) 400 MG tablet Take 400 mg by mouth 2 (two) times daily.    metoprolol (LOPRESSOR) 100 MG tablet TAKE 1 TABLET BY MOUTH TWICE DAILY Qty: 180 tablet, Refills: 0    minoxidil (LONITEN) 2.5 MG tablet Take 1 tablet (2.5 mg total) by mouth 2 (two) times daily. Qty: 180 tablet, Refills: 1    nitroGLYCERIN (NITROSTAT) 0.4 MG SL tablet Place 1 tablet (0.4 mg total) under the tongue every 5 (five) minutes as needed for chest pain. Qty: 25 tablet, Refills: 1    polyethylene glycol (MIRALAX / GLYCOLAX) packet Take 17 g by mouth daily.     pravastatin (PRAVACHOL) 20 MG tablet Take 10 mg by mouth at bedtime.      prednisoLONE acetate (PRED FORTE) 1 % ophthalmic suspension Place 1 drop into the left eye 2 (two) times daily.     predniSONE (DELTASONE) 10 MG tablet Take 10 mg by mouth every morning.     tacrolimus (PROGRAF) 1 MG capsule Take 9 mg by mouth 2 (two) times  daily.     timolol (TIMOPTIC) 0.25 % ophthalmic solution Place 1 drop into the right eye 2 (two) times daily.    amLODipine (NORVASC) 10 MG tablet TAKE 1 TABLET BY MOUTH EVERY DAY Qty: 90 tablet, Refills: 3    diclofenac sodium (VOLTAREN) 1 % GEL Apply 4 g topically 4 (four) times daily as needed. Qty: 100 g, Refills: 11    feeding supplement, GLUCERNA SHAKE, (GLUCERNA SHAKE) LIQD Take 237 mLs by mouth 2 (two) times daily between meals. Refills: 0      STOP taking these medications     Docusate Sodium POWD        Allergies  Allergen Reactions  . Keflex [Cephalexin] Swelling    Swelling to face, chills Tolerates zosyn 02/14/13  . Propoxyphene N-Acetaminophen Hives and Itching    Patient can tolerate acetaminophen solely   Follow-up Information    Follow up with [name], MD.   Specialties:  Internal Medicine, Radiology   Why:  Appt. on April 15@ 2;15pm   Contact information:   [place] [address] [phone]       Follow up with Jenkins Rouge, MD On 05/18/2014.   Specialty:  Cardiology   Contact information:   [address] [place] [address] [phone]        The results of significant diagnostics from this hospitalization (including imaging, microbiology, ancillary and laboratory) are listed below for reference.    Significant Diagnostic Studies: Dg Chest 2 View  04/24/2014   CLINICAL DATA:  Shortness of breath for 6 days. History of congestive heart failure. History of hypertension, diabetes and asthma.  EXAM: CHEST  2 VIEW  COMPARISON:  11/30/2012.  FINDINGS: Right subclavian Port-A-Cath tip appears unchanged in the mid SVC. There is stable cardiomegaly and chronic vascular congestion. Mild atelectasis is present at both lung bases. There is no confluent airspace opacity or significant pleural effusion. The bones appear unchanged.  IMPRESSION: Stable cardiomegaly and chronic vascular congestion. No acute findings.   Electronically Signed   By: Richardean Sale M.D.   On: 04/24/2014 20:49    Microbiology: Recent Results (from the past 240 hour(s))  Clostridium Difficile by PCR     Status: None   Collection Time: 04/25/14  8:10 AM  Result Value Ref Range Status   C difficile by pcr NEGATIVE NEGATIVE Final     Labs: Basic Metabolic Panel:  Recent Labs Lab 04/24/14 1838 04/25/14 0807 04/26/14 0525  NA 138 139 138  K 5.2* 4.2 3.9  CL 100 98 98  CO2 34* 31 33*  GLUCOSE 163* 243* 253*  BUN 26* 23 28*  CREATININE 1.84* 1.62* 1.35*  CALCIUM 9.4 9.2 8.7  MG  --  2.1  --    Liver Function Tests: No results for input(s): AST, ALT, ALKPHOS, BILITOT, PROT, ALBUMIN in the last 168 hours. No results for input(s): LIPASE, AMYLASE in the last 168 hours. No results for input(s): AMMONIA in the last 168 hours. CBC:  Recent Labs Lab 04/24/14 1838  WBC 4.8  HGB 12.3  HCT 40.4  MCV 92.4  PLT 228   Cardiac Enzymes:  Recent Labs Lab 04/25/14 0120 04/25/14 0807 04/25/14 1416  TROPONINI  0.07* 0.07* 0.06*   BNP: BNP (last 3 results)  Recent Labs  04/24/14 1838  BNP 571.8*    ProBNP (last 3 results) No results for input(s): PROBNP in the last 8760 hours.  CBG:  Recent Labs Lab  04/25/14 1132 04/25/14 1609 04/25/14 2216 04/26/14 0611 04/26/14 1218  GLUCAP 191* 294* 231* 211* 92       Signed:  Filemon Breton  [facility] 04/26/2014, 3:39 PM

## 2014-04-26 NOTE — Discharge Instructions (Signed)

## 2014-04-26 NOTE — Progress Notes (Signed)
  Echocardiogram 2D Echocardiogram has been performed.  Darlina Sicilian M 04/26/2014, 1:57 PM

## 2014-04-26 NOTE — Progress Notes (Signed)
Discharge instructions given to pt , spouse , dfaughter all [phone] pt wheeled to lobby by Ntblized understanding

## 2014-04-26 NOTE — Progress Notes (Signed)
Inpatient Diabetes Program Recommendations  AACE/ADA: New Consensus Statement on Inpatient Glycemic Control (2013)  Target Ranges:  Prepandial:   less than 140 mg/dL      Peak postprandial:   less than 180 mg/dL (1-2 hours)      Critically ill patients:  140 - 180 mg/dL   Reason for Visit: Hyperglycemia  Diabetes history: DM2 Outpatient Diabetes medications: NPH 28 units in am and 20 units ac supper, Regular 10-50 units tidwc Current orders for Inpatient glycemic control: Lantus 30 units QHS, Novolog 10 units tidwc + resistant tidwc and hs  Pt and daughter interested in OP Diabetes Education after discharge. Will order same. Pt states she has been on Lantus and Novolog in the past, and had to switch to NPH and Regular insulin d/t costs. Checks blood sugars 3-4 times/day. Needs insulin adjustment.  Inpatient Diabetes Program Recommendations Insulin - Basal: Increase Lantus to 35 units QHS Correction (SSI): Decrease Novolog to moderate tidwc and hs HgbA1C: Need updated HgbA1C to assess glycemic control prior to hospitalization  Note: Will continue to follow while inpatient. Thank you. Lorenda Peck, RD, LDN, CDE Inpatient Diabetes Coordinator [phone]

## 2014-04-26 NOTE — Evaluation (Signed)
Occupational Therapy Evaluation Patient Details Name: Monica Lee MRN: [id number] DOB: 07/29/1950 Today's Date: 04/26/2014    History of Present Illness 64 y.o. female admitted to [facility] on 04/24/14 with SOB.  Pt dx with CHF exacerbation and hyperkalemia.  Pt with significant PMHx of blind in L eye, chronic diastolic CHF, MI, CVA, DM 2, breast CA, CKD III, L thigh hematoma s/p surgery for hematoma and later for infection, CAD, pericardia effusion, anemia, trigeminal neuralgia, R foot surgery, and skin graft right thigh to left thigh.  Pt also reports (as does wound care RN in chart) active left arm abcess/wound.   Clinical Impression   Pt is Mod I - Independent with ADLs and ADL mobility. No further acute OT services are indicated at this time. OT will sign off    Follow Up Recommendations  No OT follow up    Equipment Recommendations  None recommended by OT    Recommendations for Other Services       Precautions / Restrictions Precautions Precautions: Fall Precaution Comments: due to h/o falls at home Restrictions Weight Bearing Restrictions: No      Mobility Bed Mobility Overal bed mobility: Independent                Transfers Overall transfer level: Independent Equipment used: None                  Balance Overall balance assessment: No apparent balance deficits (not formally assessed)                                          ADL Overall ADL's : Modified independent;At baseline                                             Vision  wears glasses, no change from baseline   Perception Perception Perception Tested?: No   Praxis Praxis Praxis tested?: Not tested    Pertinent Vitals/Pain Pain Assessment: No/denies pain     Hand Dominance Right   Extremity/Trunk Assessment Upper Extremity Assessment Upper Extremity Assessment: Overall WFL for tasks assessed   Lower Extremity Assessment Lower Extremity  Assessment: Defer to PT evaluation   Cervical / Trunk Assessment Cervical / Trunk Assessment: Normal   Communication     Cognition Arousal/Alertness: Awake/alert Behavior During Therapy: WFL for tasks assessed/performed Overall Cognitive Status: Within Functional Limits for tasks assessed                     General Comments   Pt pleasant and cooperative                 Home Living Family/patient expects to be discharged to:: Private residence Living Arrangements: Spouse/significant other Available Help at Discharge: Family;Available PRN/intermittently Type of Home: House Home Access: Stairs to enter     Home Layout: Two level Alternate Level Stairs-Number of Steps: 14 Alternate Level Stairs-Rails: Right Bathroom Shower/Tub: Teacher, early years/pre: Standard     Home Equipment: Environmental consultant - 2 wheels;Cane - single point          Prior Functioning/Environment Level of Independence: Independent with assistive device(s)        Comments: pt uses a cane at baseline due to residual left leg weakness after  multiple surgeries.     OT Diagnosis: Generalized weakness   OT Problem List:     OT Treatment/Interventions:      OT Goals(Current goals can be found in the care plan section) Acute Rehab OT Goals Patient Stated Goal: go home OT Goal Formulation: With patient  OT Frequency:     Barriers to D/C:  none                        End of Session    Activity Tolerance: Patient tolerated treatment well Patient left: in bed;with call bell/phone within reach   Time: 0923-0939 OT Time Calculation (min): 16 min Charges:  OT General Charges $OT Visit: 1 Procedure OT Evaluation $Initial OT Evaluation Tier I: 1 Procedure G-Codes:    Britt Bottom 04/26/2014, 10:54 AM

## 2014-04-29 ENCOUNTER — Ambulatory Visit (INDEPENDENT_AMBULATORY_CARE_PROVIDER_SITE_OTHER): Payer: Medicare Other | Admitting: Internal Medicine

## 2014-04-29 ENCOUNTER — Encounter: Payer: Self-pay | Admitting: Internal Medicine

## 2014-04-29 VITALS — BP 128/80 | HR 69 | Temp 98.6°F | Resp 18 | Ht 65.0 in | Wt 196.0 lb

## 2014-04-29 DIAGNOSIS — E1159 Type 2 diabetes mellitus with other circulatory complications: Secondary | ICD-10-CM | POA: Diagnosis not present

## 2014-04-29 DIAGNOSIS — L02414 Cutaneous abscess of left upper limb: Secondary | ICD-10-CM | POA: Diagnosis not present

## 2014-04-29 DIAGNOSIS — I5032 Chronic diastolic (congestive) heart failure: Secondary | ICD-10-CM

## 2014-04-29 DIAGNOSIS — I2510 Atherosclerotic heart disease of native coronary artery without angina pectoris: Secondary | ICD-10-CM

## 2014-04-29 DIAGNOSIS — I10 Essential (primary) hypertension: Secondary | ICD-10-CM

## 2014-04-29 NOTE — Progress Notes (Signed)
Subjective:    Patient ID: Monica Lee, female    DOB: [birth year], 64 y.o.   MRN: [id number]  HPI  Here to f/u post hospn for left arm medial abscess and chronic dCHF; overall doing ok,  Pt denies chest pain, increasing sob or doe, wheezing, orthopnea, PND, increased LE swelling, palpitations, dizziness or syncope.  Pt denies new neurological symptoms such as new headache, or facial or extremity weakness or numbness.  Pt denies polydipsia, polyuria, or low sugar episode.   Pt denies new neurological symptoms such as new headache, or facial or extremity weakness or numbness.   Pt states overall good compliance with meds including the diuretiuc, mostly trying to follow appropriate diet, with wt overall stable post d/c.  Wt Readings from Last 3 Encounters:  04/29/14 196 lb (88.905 kg)  04/26/14 194 lb 12.8 oz (88.361 kg)  03/30/14 199 lb 1.3 oz (90.302 kg)   Pt arm abscess was never on the right as listed on her problem list., has some firmness at the site medial left arm, but no tender, drainage. Needs DM shoes  - order faxed to [facility]. Past Medical History  Diagnosis Date  . S/P kidney transplant     CADAVERIC --  05/2006  . Hyperlipidemia   . DJD (degenerative joint disease)   . Hx of colonic polyps   . Diabetic retinopathy     RIGHT EYE  . Hypertension   . Asthma   . Blind left eye     OCCLUDED CORNEA,  FUNDI--  FAILED SURGERY REPAIR 2003  . Chronic diastolic CHF (congestive heart failure)     a. Cardiac MRI (10/2012): Moderate to severe LVH, EF 55%, chordal SAM but no LVOT gradient, mod circumferential effusion, mild LAE.  b.  Echocardiogram (11/06/12): Severe LVH, EF 67-34%, grade 1 diastolic dysfunction, moderate effusion.  . Wears glasses   . History of myocardial infarction     2011--  S/P CARDIAC CATH ([place])  NON-OBSTRUCTIVE CAD  . GERD (gastroesophageal reflux disease)   . History of kidney stones   . History of CVA (cerebrovascular accident) without  residual deficits     2008  AND TIA IN 2008  W/ NO RESIDUAL  . History of peptic ulcer     2011--  RESOLVED  . 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 Gastroparesis due to DM   . Chronic kidney disease (CKD), stage III (moderate)     NEPHROLOGIST--  DR Freddrick March  . Open thigh wound     ANTERIOR  . Coronary artery disease CARDIOLOGIST--  DR Johnsie Cancel    NON-OBSTRUCTIVE CAD  PER CARDIAC CATH  2011  . Pericardial effusion without cardiac tamponade     PER DR NISHAN  NOTE --  NOT MALIGNANT  ? RELATED TO HYPOTHYROIDISM  . Thyromegaly     MULTINODULER GOITER  . Hyperparathyroidism, secondary renal   . Anemia in chronic kidney disease   . Trigeminal neuralgia   . Hypothyroidism   . At risk for sleep apnea     STOP-BANG= 4   SENT TO PCP  03-25-2013   Past Surgical History  Procedure Laterality Date  . Transplantation renal  05/2006    CADAVERIC  . Breast biopsy  2010  . Cholecystectomy  2008  . Hemorrhoid  surgery  01/23/2012    Procedure: HEMORRHOIDECTOMY PROLAPSED;  Surgeon: Leighton Ruff, MD;  Location: [facility];  Service: General;  Laterality: N/A;  . Foot surgery Right 2013  . Breast lumpectomy with sentinel lymph node biopsy Right 11/09/2012    Procedure: RIGHT BREAST LUMPECTOMY WITH SENTINEL LYMPH NODE REMOVAL;  Surgeon: Haywood Lasso, MD;  Location: [place];  Service: General;  Laterality: Right;  . Portacath placement Right 11/30/2012    Procedure: INSERTION PORT-A-CATH;  Surgeon: Haywood Lasso, MD;  Location: [place];  Service: General;  Laterality: Right;  . Irrigation and debridement abscess Left 02/14/2013    Procedure: IRRIGATION AND DEBRIDEMENT LEFT THIGH ABSCESS;  Surgeon: Joyice Faster. Cornett, MD;  Location: [place];  Service: General;  Laterality: Left;  . Incision and drainage of wound Left  02/18/2013    Procedure: IRRIGATION AND DEBRIDEMENT THIGH WOUND;  Surgeon: Rolm Bookbinder, MD;  Location: [place];  Service: General;  Laterality: Left;  . Cataract extraction w/ intraocular lens  implant, bilateral    . Repair  intestinal perforation surgery  01-31-2009  . Cardiac catheterization  04-12-2009  DR EVELYNE GOUDREAU ([facility] IN [place], [place])    NON-OBSTRUCTIVE CAD/  mLAD 40%,  oLAD 50%,  dCFX 40%,  OM1  40%,  mRCA  &  dRCA  50%/  LVEF 65% /  ELEVATED  LVEDP  . Transthoracic echocardiogram  01-19-2013  DR NISHAN    SEVERE LVH/  EF 70-96%/  GRADE I DIASTOLIC DYSFUNCTION/  MODERATE LAE/  MILD IMPROVEMENT OF MODERATE CIRCUMFERENTIAL INFERIOROLATERAL PERICARDIAL EFFUSION WITH NO TAMPONADE  . Cardiovascular stress test  11-04-2012  DR NISHAN    HIGH RISK NUCLEAR STUDY/  FIXED DEFECT AFFECTING ENTIRE INFEROLATERAL AND ANTEROLATERAL WALL/  MODERATE ISCHEMIA  AT MID/ APICAL ANTEROR AND INFERIOR WALL/  GLOBAL HYPOKINESIS/  LVEF 28% (FELT TO BE FALSE +,  ECHO & cMRI  NORMAL EF AND NORMAL WM)  . Vaginal hysterectomy  1990's    W/  BILATERAL SALPINGOOPHORECTOMY  . Eye surgery Left 2003    REPAIR CORNEA  . Skin split graft Left 03/29/2013    Procedure: SKIN GRAFT SPLIT THICKNESS WITH A-CELL AND VAC TO LEFT THIGH;  Surgeon: Theodoro Kos, DO;  Location: [place];  Service: Plastics;  Laterality: Left;    reports that she has never smoked. She has never used smokeless tobacco. She reports that she does not drink alcohol or use illicit drugs. family history includes Arthritis in her other; Diabetes in her other and other; Heart disease in her father and other; Hypertension in her other; Thyroid disease in her other. There is no history of Kidney disease. Allergies  Allergen Reactions  . Keflex [Cephalexin] Swelling    Swelling to face, chills Tolerates zosyn 02/14/13  . Propoxyphene N-Acetaminophen Hives and Itching    Patient can tolerate acetaminophen solely   Current Outpatient  Prescriptions on File Prior to Visit  Medication Sig Dispense Refill  . albuterol (ACCUNEB) 0.63 MG/3ML nebulizer solution Take 1 ampule by nebulization every 6 (six) hours as needed for wheezing.    Marland Kitchen albuterol (PROVENTIL HFA;VENTOLIN HFA) 108 (90 BASE) MCG/ACT inhaler Inhale 2 puffs into the lungs every 6 (six) hours as needed for wheezing or shortness of breath. 1 Inhaler 11  . aspirin EC 81 MG tablet Take 81 mg by mouth daily.    Marland Kitchen azaTHIOprine (IMURAN) 50 MG tablet Take 75 mg by mouth daily.     . brimonidine (ALPHAGAN) 0.2 %  ophthalmic solution Place 1 drop into the right eye 2 (two) times daily.     . cinacalcet (SENSIPAR) 30 MG tablet Take 30 mg by mouth every other day.     . diclofenac sodium (VOLTAREN) 1 % GEL Apply 4 g topically 4 (four) times daily as needed. 100 g 11  . docusate sodium (COLACE) 50 MG capsule Take 200 mg by mouth 2 (two) times daily.    . feeding supplement, GLUCERNA SHAKE, (GLUCERNA SHAKE) LIQD Take 237 mLs by mouth 2 (two) times daily between meals.  0  . fluticasone (FLONASE) 50 MCG/ACT nasal spray Place 2 sprays into both nostrils daily. 16 g 2  . furosemide (LASIX) 80 MG tablet Take 160 mg by mouth 2 (two) times daily.     . insulin NPH Human (HUMULIN N,NOVOLIN N) 100 UNIT/ML injection Inject 0.2 mLs (20 Units total) into the skin 2 (two) times daily before a meal. 20 mL 2  . insulin regular (NOVOLIN R,HUMULIN R) 100 units/mL injection Inject 0.1-0.5 mLs (10-50 Units total) into the skin 3 (three) times daily before meals. Sliding Scale based on blood glucose 20 mL 2  . isosorbide mononitrate (IMDUR) 60 MG 24 hr tablet TAKE 1 TABLET BY MOUTH EVERY DAY 90 tablet 3  . KLOR-CON M20 20 MEQ tablet Takes 1/2 tab in morning and 1/2 at night.    . levothyroxine (SYNTHROID, LEVOTHROID) 75 MCG tablet Take 1 tablet (75 mcg total) by mouth daily. 90 tablet 3  . magnesium oxide (MAG-OX) 400 MG tablet Take 400 mg by mouth 2 (two) times daily.    . metoprolol (LOPRESSOR) 100 MG  tablet TAKE 1 TABLET BY MOUTH TWICE DAILY 180 tablet 0  . minoxidil (LONITEN) 2.5 MG tablet Take 1 tablet (2.5 mg total) by mouth 2 (two) times daily. 180 tablet 1  . nitroGLYCERIN (NITROSTAT) 0.4 MG SL tablet Place 1 tablet (0.4 mg total) under the tongue every 5 (five) minutes as needed for chest pain. 25 tablet 1  . polyethylene glycol (MIRALAX / GLYCOLAX) packet Take 17 g by mouth daily.     . pravastatin (PRAVACHOL) 20 MG tablet Take 10 mg by mouth at bedtime.      . prednisoLONE acetate (PRED FORTE) 1 % ophthalmic suspension Place 1 drop into the left eye 2 (two) times daily.     . predniSONE (DELTASONE) 10 MG tablet Take 10 mg by mouth every morning.     . tacrolimus (PROGRAF) 1 MG capsule Take 9 mg by mouth 2 (two) times daily.     . timolol (TIMOPTIC) 0.25 % ophthalmic solution Place 1 drop into the right eye 2 (two) times daily.     No current facility-administered medications on file prior to visit.    Review of Systems  Constitutional: Negative for unusual diaphoresis or night sweats HENT: Negative for ringing in ear or discharge Eyes: Negative for double vision or worsening visual disturbance.  Respiratory: Negative for choking and stridor.   Gastrointestinal: Negative for vomiting or other signifcant bowel change Genitourinary: Negative for hematuria or change in urine volume.  Musculoskeletal: Negative for other MSK pain or swelling Skin: Negative for color change and worsening wound.  Neurological: Negative for tremors and numbness other than noted  Psychiatric/Behavioral: Negative for decreased concentration or agitation other than above       Objective:   Physical Exam BP 128/80 mmHg  Pulse 69  Temp(Src) 98.6 F (37 C) (Oral)  Resp 18  Ht 5\' 5"  (1.651 m)  Wt 196 lb (88.905 kg)  BMI 32.62 kg/m2  SpO2 95% VS noted,  Constitutional: Pt appears in no significant distress HENT: Head: NCAT.  Right Ear: External ear normal.  Left Ear: External ear normal.  Eyes: .  Pupils are equal, round, and reactive to light. Conjunctivae and EOM are normal Neck: Normal range of motion. Neck supple.  Cardiovascular: Normal rate and regular rhythm.   Pulmonary/Chest: Effort normal and breath sounds without rales or wheezing.  Abd:  Soft, NT, ND, + BS Neurological: Pt is alert. Not confused , motor grossly intact Skin: Skin is warm. No rash, no LE edema except trace right anke effusion chronic, left medial arm with 1 cm area induration subq but NT, no drainage or erythema Psychiatric: Pt behavior is normal. No agitation.     Assessment & Plan:

## 2014-04-29 NOTE — Assessment & Plan Note (Signed)
Lab Results  Component Value Date   HGBA1C 9.9* 09/29/2013   Declines further labs today, o/w stable overall by history and exam, recent data reviewed with pt, and pt to continue medical treatment as before,  to f/u any worsening symptoms or concerns, will need lab f/u next visit, OK for DM shoes

## 2014-04-29 NOTE — Assessment & Plan Note (Signed)
stable overall by history and exam, recent data reviewed with pt, and pt to continue medical treatment as before,  to f/u any worsening symptoms or concerns BP Readings from Last 3 Encounters:  04/29/14 128/80  04/26/14 139/52  03/30/14 136/88

## 2014-04-29 NOTE — Patient Instructions (Signed)
Please continue all other medications as before, and refills have been done if requested.  Please have the pharmacy call with any other refills you may need.  Please continue your efforts at being more active, low cholesterol diet, and weight control.  Please keep your appointments with your specialists as you may have planned  We will have the order for Diabetic shoes faxed to [place]  Please return in 6 months, or sooner if needed

## 2014-04-29 NOTE — Assessment & Plan Note (Signed)
Improved, ok to continue supportive care, no further antibx needed

## 2014-04-29 NOTE — Assessment & Plan Note (Signed)
stable overall by history and exam, recent data reviewed with pt, and pt to continue medical treatment as before,  to f/u any worsening symptoms or concerns Lab Results  Component Value Date   WBC 4.8 04/24/2014   HGB 12.3 04/24/2014   HCT 40.4 04/24/2014   PLT 228 04/24/2014   GLUCOSE 253* 04/26/2014   CHOL 134 09/29/2013   TRIG 264.0* 09/29/2013   HDL 35.80* 09/29/2013   LDLDIRECT 57.7 09/29/2013   LDLCALC 68 09/04/2010   ALT 17 03/07/2014   AST 22 03/07/2014   NA 138 04/26/2014   K 3.9 04/26/2014   CL 98 04/26/2014   CREATININE 1.35* 04/26/2014   BUN 28* 04/26/2014   CO2 33* 04/26/2014   TSH 19.709* 04/25/2014   INR 1.04 04/25/2014   HGBA1C 9.9* 09/29/2013

## 2014-05-09 ENCOUNTER — Other Ambulatory Visit: Payer: Self-pay | Admitting: Cardiovascular Disease

## 2014-05-09 ENCOUNTER — Ambulatory Visit: Payer: Medicare Other | Admitting: Pulmonary Disease

## 2014-05-16 NOTE — Progress Notes (Signed)
Patient ID: Monica Lee, female   DOB: 1950-10-04, 64 y.o.   MRN: [id number] Monica Lee is a 64 y.o.  female the history of Y0VP, HTN, diastolic CHF, hyperthyroidism, renal failure status post renal transplant, breast CA, prior TIA. She apparently had cardiac catheterization several years ago in [place] that demonstrated no CAD. She was recently seen by me  for surgical clearance for breast cancer. Lexiscan Myoview (11/05/12): High-risk scan; fixed defect affecting the entire inferolateral and anterolateral wall; mid/apical anterior and mid/apical inferior moderate ischemia, EF 28%. Cardiac MRI (10/2012): Moderate to severe LVH, EF 55%, chordal SAM but no LVOT gradient, mod circumferential effusion, mild LAE. Echocardiogram (11/06/12): Severe LVH, EF 71-06%, grade 1 diastolic dysfunction, moderate effusion. Tests were reviewed by Dr. Percival Spanish. Given discrepancy of results and lack of symptoms, it was felt the nuclear study was false positive and she was cleared to proceed with lumpectomy on 11/08/12.  She was admitted 26/94-85/4 with a/c diastolic CHF. She presented to the ED with worsening dyspnea and an 8 pound weight gain, orthopnea and PND. She was diuresed with IV Lasix. She was seen by Dr. Haroldine Laws and the Heart Failure Team. Metolazone was added to her medical regimen.   Done with chemo Had  XRT in [facility]  04/26/14  Cr 1.35   Recent hospitalization for fluid overload and elevated Cr ? Due to Bactrim  Renal Dr Deiderding increased her  Lasix to 160 bid   Echo :  04/26/14 reviewed Study Conclusions  - Left ventricle: The cavity size was normal. Wall thickness was increased in a pattern of severe LVH. Systolic function was normal. The estimated ejection fraction was in the range of 55% to 60%. Wall motion was normal; there were no regional wall motion abnormalities. Doppler parameters are consistent with abnormal left ventricular relaxation (grade 1  diastolic dysfunction). Doppler parameters are consistent with high ventricular filling pressure. - Mitral valve: Calcified annulus. - Left atrium: The atrium was moderately dilated. - Right ventricle: The cavity size was normal. Wall thickness was increased. - Pulmonary arteries: Systolic pressure was mildly increased. PA peak pressure: 38 mm Hg (S). - Pericardium, extracardiac: A moderate pericardial effusion was identified.  Impressions:  - Normal LV function; grade 1 diastolic dysfunction with elevated LV filling pressure; moderate LAE; moderate pericardial effusion; mild TR; mildly elevated pulmonary pressure. Myocardium with speckled appearance; consider amyloid.  Although Cr normal no MRI with gad done due to renal transplant history     ROS: Denies fever, malais, weight loss, blurry vision, decreased visual acuity, cough, sputum, SOB, hemoptysis, pleuritic pain, palpitaitons, heartburn, abdominal pain, melena, lower extremity edema, claudication, or rash.  All other systems reviewed and negative  General: Affect appropriate Obese black female  HEENT: normal Neck supple with no adenopathy JVP normal no bruits no thyromegaly Lungs clear with no wheezing and good diaphragmatic motion Heart:  S1/S2 soft SEM with no change valsalva murmur, no rub, gallop or click PMI normal Abdomen: benighn, BS positve, no tenderness, no AAA transplanted kidney lower abdomen  no bruit.  No HSM or HJR Distal pulses intact with no bruits Plus one bilateral edema Neuro non-focal Skin warm and dry No muscular weakness   Current Outpatient Prescriptions  Medication Sig Dispense Refill  . albuterol (ACCUNEB) 0.63 MG/3ML nebulizer solution Take 1 ampule by nebulization every 6 (six) hours as needed for wheezing.    Marland Kitchen albuterol (PROVENTIL HFA;VENTOLIN HFA) 108 (90 BASE) MCG/ACT inhaler Inhale 2 puffs into the lungs every 6 (six)  hours as needed for wheezing or shortness of  breath. 1 Inhaler 11  . amLODipine (NORVASC) 5 MG tablet Take 5 mg by mouth daily.    Marland Kitchen aspirin EC 81 MG tablet Take 81 mg by mouth daily.    Marland Kitchen azaTHIOprine (IMURAN) 50 MG tablet Take 75 mg by mouth daily.     . brimonidine (ALPHAGAN) 0.2 % ophthalmic solution Place 1 drop into the right eye 2 (two) times daily.     . cinacalcet (SENSIPAR) 30 MG tablet Take 30 mg by mouth every other day.     . docusate sodium (COLACE) 50 MG capsule Take 200 mg by mouth 2 (two) times daily.    . feeding supplement, GLUCERNA SHAKE, (GLUCERNA SHAKE) LIQD Take 237 mLs by mouth 2 (two) times daily between meals.  0  . furosemide (LASIX) 80 MG tablet Take 160 mg by mouth 2 (two) times daily.     . insulin NPH Human (HUMULIN N,NOVOLIN N) 100 UNIT/ML injection Inject 0.2 mLs (20 Units total) into the skin 2 (two) times daily before a meal. (Patient taking differently: Inject 20-28 Units into the skin 2 (two) times daily before a meal. 20-28units) 20 mL 2  . insulin regular (NOVOLIN R,HUMULIN R) 100 units/mL injection Inject 0.1-0.5 mLs (10-50 Units total) into the skin 3 (three) times daily before meals. Sliding Scale based on blood glucose 20 mL 2  . isosorbide mononitrate (IMDUR) 60 MG 24 hr tablet TAKE 1 TABLET BY MOUTH EVERY DAY 90 tablet 3  . KLOR-CON M20 20 MEQ tablet Takes 1/2 tab in morning and 1/2 at night.    . levothyroxine (SYNTHROID, LEVOTHROID) 75 MCG tablet Take 1 tablet (75 mcg total) by mouth daily. 90 tablet 3  . magnesium oxide (MAG-OX) 400 MG tablet Take 400 mg by mouth 2 (two) times daily.    . metoprolol (LOPRESSOR) 100 MG tablet TAKE 1 TABLET BY MOUTH TWICE DAILY 180 tablet 0  . minoxidil (LONITEN) 2.5 MG tablet Take 1 tablet (2.5 mg total) by mouth 2 (two) times daily. 180 tablet 1  . nitroGLYCERIN (NITROSTAT) 0.4 MG SL tablet Place 1 tablet (0.4 mg total) under the tongue every 5 (five) minutes as needed for chest pain. 25 tablet 1  . polyethylene glycol (MIRALAX / GLYCOLAX) packet Take 17 g by  mouth daily.     . pravastatin (PRAVACHOL) 20 MG tablet Take 10 mg by mouth at bedtime.      . prednisoLONE acetate (PRED FORTE) 1 % ophthalmic suspension Place 1 drop into the left eye 2 (two) times daily.     . predniSONE (DELTASONE) 10 MG tablet Take 10 mg by mouth every morning.     . tacrolimus (PROGRAF) 1 MG capsule Take 9 mg by mouth 2 (two) times daily.     . timolol (TIMOPTIC) 0.25 % ophthalmic solution Place 1 drop into the right eye 2 (two) times daily.     No current facility-administered medications for this visit.    Allergies  Keflex and Propoxyphene n-acetaminophen  Electrocardiogram:  SR LVH with strain and prominent voltage 2014   04/26/14   SR rate 73 LVH with strain QT 468  Assessment and Plan Diastolic CHF:  Only grade one on echo but clincially worse  Diuretics adjusted by Renal CRF:  Cr 1.35 on prednisone and prograf Pericardial Effusion:  Chronic not hemodynamically significant f/u echo in 6 months HTN:  Renovascular  Well controlled.  Continue current medications and low sodium Dash type diet.  DM:  Discussed low carb diet.  Target hemoglobin A1c is 6.5 or less.  Continue current medications.

## 2014-05-17 DIAGNOSIS — N2581 Secondary hyperparathyroidism of renal origin: Secondary | ICD-10-CM | POA: Diagnosis not present

## 2014-05-17 DIAGNOSIS — I129 Hypertensive chronic kidney disease with stage 1 through stage 4 chronic kidney disease, or unspecified chronic kidney disease: Secondary | ICD-10-CM | POA: Diagnosis not present

## 2014-05-17 DIAGNOSIS — M1A20X1 Drug-induced chronic gout, unspecified site, with tophus (tophi): Secondary | ICD-10-CM | POA: Diagnosis not present

## 2014-05-17 DIAGNOSIS — N183 Chronic kidney disease, stage 3 (moderate): Secondary | ICD-10-CM | POA: Diagnosis not present

## 2014-05-17 DIAGNOSIS — Z940 Kidney transplant status: Secondary | ICD-10-CM | POA: Diagnosis not present

## 2014-05-17 DIAGNOSIS — E118 Type 2 diabetes mellitus with unspecified complications: Secondary | ICD-10-CM | POA: Diagnosis not present

## 2014-05-18 ENCOUNTER — Encounter: Payer: Self-pay | Admitting: Cardiovascular Disease

## 2014-05-18 ENCOUNTER — Encounter: Payer: Medicare Other | Attending: Internal Medicine | Admitting: *Deleted

## 2014-05-18 ENCOUNTER — Ambulatory Visit (INDEPENDENT_AMBULATORY_CARE_PROVIDER_SITE_OTHER): Payer: Medicare Other | Admitting: Cardiovascular Disease

## 2014-05-18 ENCOUNTER — Encounter: Payer: Self-pay | Admitting: *Deleted

## 2014-05-18 ENCOUNTER — Telehealth: Payer: Self-pay

## 2014-05-18 VITALS — Ht 65.0 in | Wt 187.7 lb

## 2014-05-18 VITALS — BP 130/60 | HR 70 | Ht 65.0 in | Wt 195.8 lb

## 2014-05-18 DIAGNOSIS — I2510 Atherosclerotic heart disease of native coronary artery without angina pectoris: Secondary | ICD-10-CM

## 2014-05-18 DIAGNOSIS — I319 Disease of pericardium, unspecified: Secondary | ICD-10-CM

## 2014-05-18 DIAGNOSIS — I3139 Other pericardial effusion (noninflammatory): Secondary | ICD-10-CM

## 2014-05-18 DIAGNOSIS — Z713 Dietary counseling and surveillance: Secondary | ICD-10-CM | POA: Diagnosis not present

## 2014-05-18 DIAGNOSIS — Z794 Long term (current) use of insulin: Secondary | ICD-10-CM | POA: Diagnosis not present

## 2014-05-18 DIAGNOSIS — E119 Type 2 diabetes mellitus without complications: Secondary | ICD-10-CM | POA: Diagnosis not present

## 2014-05-18 DIAGNOSIS — I313 Pericardial effusion (noninflammatory): Secondary | ICD-10-CM

## 2014-05-18 MED ORDER — INSULIN NPH ISOPHANE & REGULAR (70-30) 100 UNIT/ML ~~LOC~~ SUSP: SUBCUTANEOUS | Status: DC

## 2014-05-18 NOTE — Addendum Note (Signed)
Addended by: Biagio Borg on: 05/18/2014 07:07 PM   Modules accepted: Orders, Medications

## 2014-05-18 NOTE — Telephone Encounter (Signed)
Patient was at her diabetes educational meeting with [facility] today. She wants to switch to the [employer]ion brand of insulin so that she can afford her medication. I spoke with Izora Gala over the phone and told her I would have to ask for your permission first.

## 2014-05-18 NOTE — Patient Instructions (Addendum)
Plan:  Take your insulin as presecribed Use glucose tablets for emergency low blood sugars. Keep them in your purse, bedside, car, kitchen...Marland KitchenMarland KitchenMarland Kitchen Eat 3 REAL meals per day  Always have protein with carbohydrates: Have Glucerna shake as snack before bed. Go to [facility] and get Reli-On brand insulin both N & R Snack, [employer] Protein Bars / Dannon Light & Fit Greek Yogurt / Yoplait 100 Greek yogurt No more apple or Orange Juice - Get Diet Cranberry Juice Carry glucometer with you every where you go Test sugar whenever you don't feel well. Use glucose tablets as recover product when glucose is low. Always carry carb/protein snack with you.  Return 2nd week of June for follow/up

## 2014-05-18 NOTE — Telephone Encounter (Signed)
Ok to d/c nph and novolog  OK to take relion 70/30 at 30 units twice per day

## 2014-05-18 NOTE — Progress Notes (Signed)
Diabetes Self-Management Education  Visit Type:  Initial DSME  Appt. Start Time: 1400 Appt. End Time: 1600  05/18/2014  [name], identified by name and date of birth, is a 64 y.o. female with a diagnosis of Diabetes: Type 2.  Other people present during visit:  Family Member : daughter and Curator.very involved with patient's care. Patient lives at home with her husband. She drives occasionally, goes to grocery and cooks meals. Patient noted that she had previously been on Lantus and NOvolog. That was changed to NPH and Novolin. She is obtaining this at [facility] in [place], [place]. I made her aware that she could obtain the N & H at [facility] in the Reli-On brand for $25.00 per vial. I contacted Dr. Gwynn Burly office and spoke with his Clinical Assistant and requested the new RX be called to [place]. She said she would take care of it.  ASSESSMENT  Height 5\' 5"  (1.651 m), weight 187 lb 11.2 oz (85.14 kg). Body mass index is 31.23 kg/(m^2).  Initial Visit Information:  Are you currently following a meal plan?: No Are you taking your medications as prescribed?: Yes Are you checking your feet?: Yes How many days per week are you checking your feet?: 7 How often do you need to have someone help you when you read instructions, pamphlets, or other written materials from your doctor or pharmacy?: 1 - Never   Psychosocial:   Patient Belief/Attitude about Diabetes: Motivated to manage diabetes Self-management support: Doctor's office, Family, CDE visits Other persons present: Family Member Patient Concerns: Nutrition/Meal planning, Glycemic Control Preferred Learning Style: No preference indicated Learning Readiness: Change in progress  Complications:   Last HgB A1C per patient/outside source: 9.9 mg/dL How often do you check your blood sugar?: 3-4 times/day  Diet Intake:   Exercise:  Exercise: ADL's  Individualized Plan for Diabetes Self-Management Training:   Learning  Objective:  Patient will have a greater understanding of diabetes self-management. Patient education plan per assessed needs and concerns is to attend individual sessions for     Education Topics Reviewed with Patient Today:   Role of diet in the treatment of diabetes and the relationship between the three main macronutrients and blood glucose level, Information on hints to eating out and maintain blood glucose control., Meal options for control of blood glucose level and chronic complications.  PATIENTS GOALS/Plan (Developed by the patient):  Nutrition: General guidelines for healthy choices and portions discussed Medications: take my medication as prescribed Reducing Risk: do foot checks daily   Patient Instructions  Plan:  Take your insulin as presecribed Use glucose tablets for emergency low blood sugars. Keep them in your purse, bedside, car, kitchen...Marland KitchenMarland KitchenMarland Kitchen Eat 3 REAL meals per day  Always have protein with carbohydrates: Have Glucerna shake as snack before bed. Go to [facility] and get Reli-On brand insulin both N & R Snack, [employer] Protein Bars / Dannon Light & Fit Greek Yogurt / Yoplait 100 Greek yogurt No more apple or Orange Juice - Get Diet Cranberry Juice Carry glucometer with you every where you go Test sugar whenever you don't feel well. Use glucose tablets as recover product when glucose is low. Always carry carb/protein snack with you.  Return 2nd week of June for follow/up  Expected Outcomes:  Demonstrated interest in learning. Expect positive outcomes  Education material provided: Meal plan card, My Plate and Snack sheet  If problems or questions, patient to contact team via:  Phone  Future DSME appointment: 4-6 wks (appt 06-28-14)

## 2014-05-18 NOTE — Patient Instructions (Signed)
Medication Instructions:  Your physician recommends that you continue on your current medications as directed. Please refer to the Current Medication list given to you today.   Labwork: NONE  Testing/Procedures: Your physician has requested that you have an echocardiogram. Echocardiography is a painless test that uses sound waves to create images of your heart. It provides your doctor with information about the size and shape of your heart and how well your heart's chambers and valves are working. This procedure takes approximately one hour. There are no restrictions for this procedure. IN 6 MONTHS SAME DAY AS F/U APPT.   Follow-Up: Your physician wants you to follow-up in: 6 months with Dr. Johnsie Cancel. You will receive a reminder letter in the mail two months in advance. If you don't receive a letter, please call our office to schedule the follow-up appointment.   Any Other Special Instructions Will Be Listed Below (If Applicable).

## 2014-05-20 ENCOUNTER — Ambulatory Visit: Payer: Medicare Other | Admitting: Dietician

## 2014-05-26 ENCOUNTER — Other Ambulatory Visit: Payer: Self-pay | Admitting: Internal Medicine

## 2014-05-26 DIAGNOSIS — Z940 Kidney transplant status: Secondary | ICD-10-CM | POA: Diagnosis not present

## 2014-06-02 ENCOUNTER — Ambulatory Visit (HOSPITAL_COMMUNITY): Payer: Medicare Other | Attending: Cardiovascular Disease

## 2014-06-02 ENCOUNTER — Other Ambulatory Visit: Payer: Self-pay

## 2014-06-02 DIAGNOSIS — I319 Disease of pericardium, unspecified: Secondary | ICD-10-CM | POA: Diagnosis not present

## 2014-06-02 DIAGNOSIS — I3139 Other pericardial effusion (noninflammatory): Secondary | ICD-10-CM

## 2014-06-02 DIAGNOSIS — I313 Pericardial effusion (noninflammatory): Secondary | ICD-10-CM

## 2014-06-02 LAB — ECHOCARDIOGRAM COMPLETE

## 2014-06-29 DIAGNOSIS — E1139 Type 2 diabetes mellitus with other diabetic ophthalmic complication: Secondary | ICD-10-CM | POA: Diagnosis not present

## 2014-08-09 ENCOUNTER — Other Ambulatory Visit: Payer: Self-pay | Admitting: Internal Medicine

## 2014-08-09 ENCOUNTER — Other Ambulatory Visit: Payer: Self-pay | Admitting: Cardiovascular Disease

## 2014-08-18 DIAGNOSIS — H4011X2 Primary open-angle glaucoma, moderate stage: Secondary | ICD-10-CM | POA: Diagnosis not present

## 2014-08-18 DIAGNOSIS — H25811 Combined forms of age-related cataract, right eye: Secondary | ICD-10-CM | POA: Diagnosis not present

## 2014-08-18 DIAGNOSIS — H402223 Chronic angle-closure glaucoma, left eye, severe stage: Secondary | ICD-10-CM | POA: Diagnosis not present

## 2014-08-25 ENCOUNTER — Ambulatory Visit: Payer: Medicare Other | Admitting: *Deleted

## 2014-08-25 ENCOUNTER — Ambulatory Visit (INDEPENDENT_AMBULATORY_CARE_PROVIDER_SITE_OTHER): Payer: Medicare Other | Admitting: Internal Medicine

## 2014-08-25 ENCOUNTER — Encounter: Payer: Self-pay | Admitting: Internal Medicine

## 2014-08-25 ENCOUNTER — Other Ambulatory Visit: Payer: Self-pay | Admitting: Internal Medicine

## 2014-08-25 ENCOUNTER — Other Ambulatory Visit (INDEPENDENT_AMBULATORY_CARE_PROVIDER_SITE_OTHER): Payer: Medicare Other | Admitting: *Deleted

## 2014-08-25 VITALS — BP 118/64 | HR 64 | Temp 98.1°F | Resp 12 | Wt 193.6 lb

## 2014-08-25 DIAGNOSIS — E1159 Type 2 diabetes mellitus with other circulatory complications: Secondary | ICD-10-CM | POA: Diagnosis not present

## 2014-08-25 DIAGNOSIS — I2510 Atherosclerotic heart disease of native coronary artery without angina pectoris: Secondary | ICD-10-CM | POA: Diagnosis not present

## 2014-08-25 LAB — POCT GLYCOSYLATED HEMOGLOBIN (HGB A1C): HEMOGLOBIN A1C: 7.5

## 2014-08-25 MED ORDER — INSULIN NPH ISOPHANE & REGULAR (70-30) 100 UNIT/ML ~~LOC~~ SUSP: SUBCUTANEOUS | Status: DC

## 2014-08-25 NOTE — Progress Notes (Signed)
Patient ID: JOAQUINA NISSEN, female   DOB: 02/12/1950, 64 y.o.   MRN: [id number]  HPI: SHERLYNN TOURVILLE is a 64 y.o.-year-old woman, returning for f/u for DM2, dx 1980s, insulin-dependent, uncontrolled, with multiple complications (diabetic retinopathy, history of cadaveric kidney transplant in 05/2006 in [place] - now with CKD stage III, gastroparesis, CAD-history of MI in 0626, CHF- diastolic dysfunction, history of TIA and stroke in 2008). Last visit 8 mo ago!  She was hospitalized for dCHF exacerbation + ARI (? If from Bactrim) with fluid overload, including pericardial effusion.   Last hemoglobin A1c was: Lab Results  Component Value Date   HGBA1C 9.9* 09/29/2013   HGBA1C 9.5* 06/01/2013   HGBA1C 10.7* 02/14/2013  Received labs from [facility] (Dr. Jimmy Footman) from 08/31/2013: - HbA1c 10.6%. Per dr's note, she ran out of Lantus and has not been taking it regularly before running out, either. He gave her samples.   Her hemoglobin A1c results are discordant with her very carefully documented sugar logs.   She is on Prednisone 10 mg daily for her kidney transplant.   Pt was on a regimen of: - Lantus 55 >> 45 units (vials) - Novolog 13-13-16 >> 15-15-17 units (vials) - Novolog SSI: target 150, ISF 10  She was on: Insulin Before breakfast Before lunch Before dinner  Regular 10 units before a small meal 14 units before a regular meal 18 units before a larger meal 14 units before a regular meal 18 units before a larger meal 14 units before a regular meal 18 units before a larger meal  NPH 28  20   Continue current sliding scale added to the Regular insulin dose:  - 150- 160: + 1 unit  - 161- 170: + 2 units  - 171- 180: + 3 units  - 181- 190: + 4 units  - >190: + 5 units  PCP changed her regimen to 70/30 in 05/2014  - I was not aware of this change: - Novolin 70/30 - pt confused about how she is actually taking it, but   in am:   <100: no insulin 100-150: 30 units 150-200: 40 units >200:  50 units  At bedtime! Or before eating dinner if higher CBG then:  <100: no insulin 100-150: 30 units 150-180: 40-45 units >180: 50 units  Pt checks her sugars 3-4x a day and they are (+ log today): - am:50 x 1, [phone] >> 97-160 >>112-231 >> 103-165, 198, 241, 281 >> 78-281 - pre-lunch: 88-160s >> 114-174, 252 x1 >> 135 >> 113, 127-213 >> 29, 116-164, 186, 288 >> 96-222, 262 - pre-dinner: 75-150s  >> 113-160, 199 x1 >> 120-130 >> 112, 116-216, 325 >> 111-134 >> 100-180, 251 - bedtime: 101-198, mostly 100-150 >> 90-160 >> 125, 147, 202 >> 150s >> n/c Lowest 29 x1 when came from [place] on 12/14/2013; she has hypoglycemia awareness at 80.  Highest sugar was 240 >> 325 >> 281  - pt has chronic kidney disease, last BUN, creatinine: BUN  Date Value Ref Range Status  04/26/2014 28* 6 - 23 mg/dL Final   CREATININE, SER  Date Value Ref Range Status  04/26/2014 1.35* 0.50 - 1.10 mg/dL Final  Seen by nephrology.  - Last set of lipids: Lab Results  Component Value Date   CHOL 134 09/29/2013   HDL 35.80* 09/29/2013   LDLCALC 68 09/04/2010   LDLDIRECT 57.7 09/29/2013   TRIG 264.0* 09/29/2013   CHOLHDL 4 09/29/2013   - last eye exam was in 05/12/2013.  She had surgeries in the left eye for bleeding. She has cataracts. - Denies numbness and tingling in her legs. She sees podiatry.  She also has a history of hypertension, hyperlipidemia, GERD, DJD, headaches, history of nephrolithiasis, history of anemia, cataracts status post surgery, history of hyperthyroidism. 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 She then fell and developed a hematoma in L leg >> vacuum >> surgery.   I reviewed pt's medications, allergies, PMH, social hx, family hx, and changes were documented in the history of present illness. Otherwise, unchanged from my initial visit note.  ROS: Constitutional: no weight loss, no fatigue, no subjective hyperthermia Eyes: no blurry vision, no  xerophthalmia ENT: no sore throat, no nodules palpated in throat, no dysphagia/odynophagia, no hoarseness Cardiovascular: no CP/SOB/palpitations/leg swelling Respiratory: no cough/+ SOB/+ wheezing Gastrointestinal: no N/V/D/C Musculoskeletal: no muscle/joint aches Skin: no rashes  PE: BP 118/64 mmHg  Pulse 64  Temp(Src) 98.1 F (36.7 C) (Oral)  Resp 12  Wt 193 lb 9.6 oz (87.816 kg)  SpO2 94% Body mass index is 32.22 kg/(m^2). Wt Readings from Last 3 Encounters:  08/25/14 193 lb 9.6 oz (87.816 kg)  05/18/14 195 lb 12.8 oz (88.814 kg)  05/18/14 187 lb 11.2 oz (85.14 kg)   Constitutional: overweight, in NAD Eyes: surgical left pupil, EOMI, no exophthalmos ENT: moist mucous membranes, no thyromegaly, no cervical lymphadenopathy Cardiovascular: RRR, No MRG Respiratory: CTA B Gastrointestinal: abdomen soft, NT, ND, BS+ Musculoskeletal: no deformities, strength intact in all 4 Skin: moist, warm Neurological: no tremor with outstretched hands, DTR normal in all 4  ASSESSMENT: 1. 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 Jimmy Footman - gastroparesis - CAD-history of MI in 2011, last cardiac cath was in 8546 - CHF-diastolic dysfunction - history of TIA and stroke in 2008  2. Papular rash on back  PLAN:  1. Patient with very long-standing diabetes, now on 70/30 insulin, started in 05/2014 by PCP. She has very fluctuating sugars due to her adjusting the regimen herself (cannot explain very well the adjustments...). I will continue her on the premixed insulin since this is much cheaper for her. We discussed that this is not as flexible as the basal-bolus regimen, but will see how she does on this. Patient Instructions  Please continue Novolin 70/30 - inject this 15-30 min before a meal:   First dose: before breakfast   <100: 15 units 100-150: 30 units 150-200:  35 units >200: 40 units   Second dose: move this before dinner  <100: 15 units 100-150: 30 units 150-200: 35 units >200: 40 units  Please return in 3 months with your sugar log.   - will check a hemoglobin A1c today >> 7.5% (better) - refilled her insulin - I will see her back in 3 months with her sugar log

## 2014-08-25 NOTE — Patient Instructions (Signed)
Please continue Novolin 70/30 - inject this 15-30 min before a meal:   First dose: before breakfast   <100: 15 units 100-150: 30 units 150-200: 35 units >200: 40 units   Second dose: move this before dinner  <100: 15 units 100-150: 30 units 150-200: 35 units >200: 40 units  Please return in 3 months with your sugar log.

## 2014-08-26 ENCOUNTER — Other Ambulatory Visit: Payer: Self-pay | Admitting: *Deleted

## 2014-08-26 DIAGNOSIS — C50511 Malignant neoplasm of lower-outer quadrant of right female breast: Secondary | ICD-10-CM

## 2014-08-29 ENCOUNTER — Other Ambulatory Visit (HOSPITAL_BASED_OUTPATIENT_CLINIC_OR_DEPARTMENT_OTHER): Payer: Medicare Other

## 2014-08-29 DIAGNOSIS — Z940 Kidney transplant status: Secondary | ICD-10-CM | POA: Diagnosis not present

## 2014-08-29 DIAGNOSIS — C50511 Malignant neoplasm of lower-outer quadrant of right female breast: Secondary | ICD-10-CM

## 2014-08-29 DIAGNOSIS — N183 Chronic kidney disease, stage 3 (moderate): Secondary | ICD-10-CM | POA: Diagnosis not present

## 2014-08-29 DIAGNOSIS — E118 Type 2 diabetes mellitus with unspecified complications: Secondary | ICD-10-CM | POA: Diagnosis not present

## 2014-08-29 DIAGNOSIS — I129 Hypertensive chronic kidney disease with stage 1 through stage 4 chronic kidney disease, or unspecified chronic kidney disease: Secondary | ICD-10-CM | POA: Diagnosis not present

## 2014-08-29 LAB — CBC WITH DIFFERENTIAL/PLATELET
BASO%: 0.4 % (ref 0.0–2.0)
BASOS ABS: 0 10*3/uL (ref 0.0–0.1)
EOS%: 0.5 % (ref 0.0–7.0)
Eosinophils Absolute: 0 10*3/uL (ref 0.0–0.5)
HEMATOCRIT: 44.7 % (ref 34.8–46.6)
HEMOGLOBIN: 13.8 g/dL (ref 11.6–15.9)
LYMPH#: 1 10*3/uL (ref 0.9–3.3)
LYMPH%: 12.8 % — ABNORMAL LOW (ref 14.0–49.7)
MCH: 28.1 pg (ref 25.1–34.0)
MCHC: 30.9 g/dL — AB (ref 31.5–36.0)
MCV: 91 fL (ref 79.5–101.0)
MONO#: 0.5 10*3/uL (ref 0.1–0.9)
MONO%: 7.1 % (ref 0.0–14.0)
NEUT#: 6 10*3/uL (ref 1.5–6.5)
NEUT%: 79.2 % — ABNORMAL HIGH (ref 38.4–76.8)
Platelets: 212 10*3/uL (ref 145–400)
RBC: 4.91 10*6/uL (ref 3.70–5.45)
RDW: 16.6 % — ABNORMAL HIGH (ref 11.2–14.5)
WBC: 7.6 10*3/uL (ref 3.9–10.3)

## 2014-08-29 LAB — COMPREHENSIVE METABOLIC PANEL (CC13)
ALT: 14 U/L (ref 0–55)
AST: 22 U/L (ref 5–34)
Albumin: 4 g/dL (ref 3.5–5.0)
Alkaline Phosphatase: 46 U/L (ref 40–150)
Anion Gap: 7 mEq/L (ref 3–11)
BUN: 23.2 mg/dL (ref 7.0–26.0)
CO2: 31 mEq/L — ABNORMAL HIGH (ref 22–29)
CREATININE: 1 mg/dL (ref 0.6–1.1)
Calcium: 9.5 mg/dL (ref 8.4–10.4)
Chloride: 104 mEq/L (ref 98–109)
EGFR: 67 mL/min/{1.73_m2} — ABNORMAL LOW (ref >90)
Glucose: 118 mg/dl (ref 70–140)
Potassium: 4.3 mEq/L (ref 3.5–5.1)
SODIUM: 142 meq/L (ref 136–145)
Total Bilirubin: 0.45 mg/dL (ref 0.20–1.20)
Total Protein: 7.1 g/dL (ref 6.4–8.3)

## 2014-09-02 DIAGNOSIS — Z940 Kidney transplant status: Secondary | ICD-10-CM | POA: Diagnosis not present

## 2014-09-05 ENCOUNTER — Telehealth: Payer: Self-pay | Admitting: Nurse Practitioner

## 2014-09-05 ENCOUNTER — Ambulatory Visit (HOSPITAL_BASED_OUTPATIENT_CLINIC_OR_DEPARTMENT_OTHER): Payer: Medicare Other | Admitting: Nurse Practitioner

## 2014-09-05 ENCOUNTER — Encounter: Payer: Self-pay | Admitting: Nurse Practitioner

## 2014-09-05 VITALS — BP 165/78 | HR 69 | Temp 97.8°F | Resp 20 | Wt 194.5 lb

## 2014-09-05 DIAGNOSIS — R0602 Shortness of breath: Secondary | ICD-10-CM | POA: Diagnosis not present

## 2014-09-05 DIAGNOSIS — C50511 Malignant neoplasm of lower-outer quadrant of right female breast: Secondary | ICD-10-CM

## 2014-09-05 DIAGNOSIS — Z171 Estrogen receptor negative status [ER-]: Secondary | ICD-10-CM | POA: Diagnosis not present

## 2014-09-05 MED ORDER — HEPARIN SOD (PORK) LOCK FLUSH 100 UNIT/ML IV SOLN
500.0000 [IU] | Freq: Once | INTRAVENOUS | Status: AC
  Administered 2014-09-05: 500 [IU] via INTRAVENOUS
  Filled 2014-09-05: qty 5

## 2014-09-05 MED ORDER — SODIUM CHLORIDE 0.9 % IJ SOLN
10.0000 mL | INTRAMUSCULAR | Status: DC | PRN
  Administered 2014-09-05: 10 mL via INTRAVENOUS
  Filled 2014-09-05: qty 10

## 2014-09-05 NOTE — Telephone Encounter (Signed)
Appointments made and avs printed for patient °

## 2014-09-05 NOTE — Progress Notes (Signed)
[place]  [phone] [phone]) [phone]     ID: Monica Lee OB: 22-Sep-1950  MR#: [id number]  HRC#:[id number]  PCP: [name], MD GYN:   SUGerald Stabs Streck] OTHER MD: Theodoro Kos, Arloa Koh, [place]  CHIEF COMPLAINT: Triple negative breast cancer  CURRENT TREATMENT: Observation   BREAST CANCER HISTORY: From the original intake note:  The patient herself palpated a mass in her right breast. She brought it to medical attention at the time of her routinely scheduled mammogram 10/08/2012. This showed an irregular mass in the right breast lower outer quadrant which by ultrasound measured 2.1 cm. Biopsy of the mass was performed the same day, and showed ([id number]) invasive ductal carcinoma, grade 3, triple negative, with an MIB-1 of 56%. She proceeded to right lumpectomy and sentinel lymph node sampling 11/09/2012 with the pathology(SZA 14-04/07/2001 showing invasive ductal carcinoma, grade 3, measuring 2.2 cm, with negative margins, and all 3 sentinel lymph nodes benign. Repeat prognostic panel was again triple negative. The HER-2 signals ratio was 1.38 and the number per cell 2.2.  Her subsequent history is as detailed below  INTERVAL HISTORY: Monica Lee returns today for follow-up of her triple negative breast cancer. She is doing well today. The interval history is remarkable for a hospital admission for an exacerbation of her CHF. They believe this was possibly triggered by a reaction to antibiotics she was placed on because of a recent abscess to her arm. She presented to the ED with shortness of breath and elevated BNP. She is stable today and still has some shortness of breath, but usually only in the heat. She is back on her exercise bike, but not as often as she knows she should be.   REVIEW OF SYSTEMS: A detailed review of systems is otherwise entirely negative, except where noted above.   PAST MEDICAL HISTORY: Past Medical History    Diagnosis Date  . S/P kidney transplant     CADAVERIC --  05/2006  . Hyperlipidemia   . DJD (degenerative joint disease)   . Hx of colonic polyps   . Diabetic retinopathy     RIGHT EYE  . Hypertension   . Asthma   . Blind left eye     OCCLUDED CORNEA,  FUNDI--  FAILED SURGERY REPAIR 2003  . Chronic diastolic CHF (congestive heart failure)     a. Cardiac MRI (10/2012): Moderate to severe LVH, EF 55%, chordal SAM but no LVOT gradient, mod circumferential effusion, mild LAE.  b.  Echocardiogram (11/06/12): Severe LVH, EF 24-82%, grade 1 diastolic dysfunction, moderate effusion.  . Wears glasses   . History of myocardial infarction     2011--  S/P CARDIAC CATH ([place], [place])  NON-OBSTRUCTIVE CAD  . GERD (gastroesophageal reflux disease)   . History of kidney stones   . History of CVA (cerebrovascular accident) without residual deficits     2008  AND TIA IN 2008  W/ NO RESIDUAL  . History of peptic ulcer     2011--  RESOLVED  . 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 Gastroparesis due to DM   . Chronic kidney disease (CKD), stage III (moderate)     NEPHROLOGIST--  DR Freddrick March  . Open thigh wound     ANTERIOR  . Coronary artery disease CARDIOLOGIST--  DR Johnsie Cancel    NON-OBSTRUCTIVE CAD  PER CARDIAC CATH  2011  . Pericardial effusion without cardiac tamponade     PER DR NISHAN  NOTE --  NOT MALIGNANT  ? RELATED TO HYPOTHYROIDISM  . Thyromegaly     MULTINODULER GOITER  . Hyperparathyroidism, secondary renal   . Anemia in chronic kidney disease   . Trigeminal neuralgia   . Hypothyroidism   . At risk for sleep apnea     STOP-BANG= 4   SENT TO PCP  03-25-2013    PAST SURGICAL HISTORY: Past Surgical History  Procedure Laterality Date  . Transplantation renal  05/2006    CADAVERIC  . Breast biopsy  2010  . Cholecystectomy  2008  .  Hemorrhoid surgery  01/23/2012    Procedure: HEMORRHOIDECTOMY PROLAPSED;  Surgeon: Leighton Ruff, MD;  [facility];  Service: General;  Laterality: N/A;  . Foot surgery Right 2013  . Breast lumpectomy with sentinel lymph node biopsy Right 11/09/2012    Procedure: RIGHT BREAST LUMPECTOMY WITH SENTINEL LYMPH NODE REMOVAL;  Surgeon: Haywood Lasso, MD;  Location: [facility];  Service: General;  Laterality: Right;  . Portacath placement Right 11/30/2012    Procedure: INSERTION PORT-A-CATH;  Surgeon: Haywood Lasso, MD;  Location: [facility];  Service: General;  Laterality: Right;  . Irrigation and debridement abscess Left 02/14/2013    Procedure: IRRIGATION AND DEBRIDEMENT LEFT THIGH ABSCESS;  Surgeon: Joyice Faster. Cornett, MD;  Location: [facility];  Service: General;  Laterality: Left;  . Incision and drainage of wound Left 02/18/2013    Procedure: IRRIGATION AND DEBRIDEMENT THIGH WOUND;  Surgeon: Rolm Bookbinder, MD;  Location: [facility];  Service: General;  Laterality: Left;  . Cataract extraction w/ intraocular lens  implant, bilateral    . Repair  intestinal perforation surgery  01-31-2009  . Cardiac catheterization  04-12-2009  DR EVELYNE GOUDREAU ([facility] IN [place], [place])    NON-OBSTRUCTIVE CAD/  mLAD 40%,  oLAD 50%,  dCFX 40%,  OM1  40%,  mRCA  &  dRCA  50%/  LVEF 65% /  ELEVATED  LVEDP  . Transthoracic echocardiogram  01-19-2013  DR NISHAN    SEVERE LVH/  EF 64-40%/  GRADE I DIASTOLIC DYSFUNCTION/  MODERATE LAE/  MILD IMPROVEMENT OF MODERATE CIRCUMFERENTIAL INFERIOROLATERAL PERICARDIAL EFFUSION WITH NO TAMPONADE  . Cardiovascular stress test  11-04-2012  DR NISHAN    HIGH RISK NUCLEAR STUDY/  FIXED DEFECT AFFECTING ENTIRE INFEROLATERAL AND ANTEROLATERAL WALL/  MODERATE ISCHEMIA  AT MID/ APICAL ANTEROR AND INFERIOR WALL/  GLOBAL HYPOKINESIS/  LVEF 28% (FELT TO BE FALSE +,  ECHO & cMRI  NORMAL EF AND NORMAL WM)  . Vaginal hysterectomy  1990's    W/  BILATERAL  SALPINGOOPHORECTOMY  . Eye surgery Left 2003    REPAIR CORNEA  . Skin split graft Left 03/29/2013    Procedure: SKIN GRAFT SPLIT THICKNESS WITH A-CELL AND VAC TO LEFT THIGH;  Surgeon: Theodoro Kos, DO;  Location: [facility];  Service: Plastics;  Laterality: Left;    FAMILY HISTORY Family History  Problem Relation Age of Onset  . Kidney disease Neg Hx   . Arthritis Other     parent  . Heart disease Other     parent  . Hypertension Other     parent  . Diabetes Other     parent  . Diabetes Other     grandparent  . Thyroid disease Other     several  .  Heart disease Father   The patient's father died at the age of 68 from a myocardial infarction. The patient's mother died in childbirth. The patient had 2 brothers, one sister. There is no history of breast or ovarian cancer in the family to her knowledge.  GYNECOLOGIC HISTORY:  Menarche age 63, first live birth age 89. The patient is GX P4. Gender 1 hysterectomy without salpingo-oophorectomy in the early 1990s. She did not take hormone replacement.   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 Her husband Monica Lee used to work in Architect. He is also retired. It's just the 2 of them at home. She has 4 grandchildren. She attends a local [employer]: Not in place   HEALTH MAINTENANCE: Social History  Substance Use Topics  . Smoking status: Never Smoker   . Smokeless tobacco: Never Used  . Alcohol Use: No    Mammo: 09/2012  Colonoscopy:  2013, Dr. Donna Christen, unsure of f/u recommendation  Bone density: unsure  Lipid panel: 2015  Allergies  Allergen Reactions  . Keflex [Cephalexin] Swelling    Swelling to face, chills Tolerates zosyn 02/14/13  . Propoxyphene N-Acetaminophen Hives and Itching    Patient can tolerate acetaminophen solely    Current Outpatient Prescriptions  Medication Sig Dispense Refill  . amLODipine (NORVASC) 5 MG tablet Take 5 mg by mouth daily.     Marland Kitchen aspirin EC 81 MG tablet Take 81 mg by mouth daily.    Marland Kitchen azaTHIOprine (IMURAN) 50 MG tablet Take 75 mg by mouth daily.     . brimonidine (ALPHAGAN) 0.2 % ophthalmic solution Place 1 drop into the right eye 2 (two) times daily.     Marland Kitchen docusate sodium (COLACE) 50 MG capsule Take 200 mg by mouth 2 (two) times daily.    . feeding supplement, GLUCERNA SHAKE, (GLUCERNA SHAKE) LIQD Take 237 mLs by mouth 2 (two) times daily between meals.  0  . furosemide (LASIX) 80 MG tablet Take 160 mg by mouth 2 (two) times daily.     . insulin NPH-regular Human (NOVOLIN 70/30) (70-30) 100 UNIT/ML injection 15-40 units inject under skin twice a day 60 mL 1  . isosorbide mononitrate (IMDUR) 60 MG 24 hr tablet TAKE 1 TABLET BY MOUTH EVERY DAY 90 tablet 3  . KLOR-CON M20 20 MEQ tablet Takes 1/2 tab in morning and 1/2 at night.    . levothyroxine (SYNTHROID, LEVOTHROID) 75 MCG tablet Take 1 tablet (75 mcg total) by mouth daily. 90 tablet 3  . magnesium oxide (MAG-OX) 400 MG tablet Take 400 mg by mouth 2 (two) times daily.    . metoprolol (LOPRESSOR) 100 MG tablet TAKE 1 TABLET BY MOUTH TWICE DAILY 180 tablet 3  . minoxidil (LONITEN) 2.5 MG tablet TAKE 1 TABLET BY MOUTH TWICE DAILY 180 tablet 0  . polyethylene glycol (MIRALAX / GLYCOLAX) packet Take 17 g by mouth daily.     . pravastatin (PRAVACHOL) 20 MG tablet Take 10 mg by mouth at bedtime.      . prednisoLONE acetate (PRED FORTE) 1 % ophthalmic suspension Place 1 drop into the left eye 2 (two) times daily.     . predniSONE (DELTASONE) 10 MG tablet Take 10 mg by mouth every morning.     . tacrolimus (PROGRAF) 1 MG capsule Take 9 mg by mouth 2 (two) times daily.     . timolol (TIMOPTIC) 0.25 % ophthalmic solution Place 1 drop into the right eye  2 (two) times daily.    Marland Kitchen albuterol (ACCUNEB) 0.63 MG/3ML nebulizer solution Take 1 ampule by nebulization every 6 (six) hours as needed for wheezing.    Marland Kitchen albuterol (PROVENTIL HFA;VENTOLIN HFA) 108 (90 BASE) MCG/ACT inhaler  Inhale 2 puffs into the lungs every 6 (six) hours as needed for wheezing or shortness of breath. (Patient not taking: Reported on 09/05/2014) 1 Inhaler 11  . nitroGLYCERIN (NITROSTAT) 0.4 MG SL tablet Place 1 tablet (0.4 mg total) under the tongue every 5 (five) minutes as needed for chest pain. (Patient not taking: Reported on 09/05/2014) 25 tablet 1   No current facility-administered medications for this visit.    OBJECTIVE: Middle-aged [place] American woman who appears stated age 64 Vitals:   09/05/14 1141  BP: 165/78  Pulse: 69  Temp: 97.8 F (36.6 C)  Resp: 20     Body mass index is 32.37 kg/(m^2).      ECOG FS:1 - Symptomatic but completely ambulatory  Skin: warm, dry  HEENT: sclerae anicteric, conjunctivae pink, oropharynx clear. No thrush or mucositis.  Lymph Nodes: No cervical or supraclavicular lymphadenopathy  Lungs: clear to auscultation bilaterally, no rales, wheezes, or rhonci  Heart: regular rate and rhythm  Abdomen: obese, soft, non tender, positive bowel sounds  Musculoskeletal: No focal spinal tenderness, no peripheral edema  Neuro: non focal, well oriented, positive affect  Breasts: right breast status post lumpectomy and radiation. No evidence of recurrent disease. Right axilla benign. Left breast unremarkable.  LAB RESULTS:  CMP     Component Value Date/Time   NA 142 08/29/2014 1150   NA 138 04/26/2014 0525   K 4.3 08/29/2014 1150   K 3.9 04/26/2014 0525   CL 98 04/26/2014 0525   CO2 31* 08/29/2014 1150   CO2 33* 04/26/2014 0525   GLUCOSE 118 08/29/2014 1150   GLUCOSE 253* 04/26/2014 0525   BUN 23.2 08/29/2014 1150   BUN 28* 04/26/2014 0525   CREATININE 1.0 08/29/2014 1150   CREATININE 1.35* 04/26/2014 0525   CALCIUM 9.5 08/29/2014 1150   CALCIUM 8.7 04/26/2014 0525   PROT 7.1 08/29/2014 1150   PROT 7.5 09/29/2013 1440   ALBUMIN 4.0 08/29/2014 1150   ALBUMIN 4.1 09/29/2013 1440   AST 22 08/29/2014 1150   AST 17 09/29/2013 1440   ALT 14  08/29/2014 1150   ALT 11 09/29/2013 1440   ALKPHOS 46 08/29/2014 1150   ALKPHOS 45 09/29/2013 1440   BILITOT 0.45 08/29/2014 1150   BILITOT 0.6 09/29/2013 1440   GFRNONAA 41* 04/26/2014 0525   GFRAA 47* 04/26/2014 0525    I No results found for: SPEP  Lab Results  Component Value Date   WBC 7.6 08/29/2014   NEUTROABS 6.0 08/29/2014   HGB 13.8 08/29/2014   HCT 44.7 08/29/2014   MCV 91.0 08/29/2014   PLT 212 08/29/2014      Chemistry      Component Value Date/Time   NA 142 08/29/2014 1150   NA 138 04/26/2014 0525   K 4.3 08/29/2014 1150   K 3.9 04/26/2014 0525   CL 98 04/26/2014 0525   CO2 31* 08/29/2014 1150   CO2 33* 04/26/2014 0525   BUN 23.2 08/29/2014 1150   BUN 28* 04/26/2014 0525   CREATININE 1.0 08/29/2014 1150   CREATININE 1.35* 04/26/2014 0525      Component Value Date/Time   CALCIUM 9.5 08/29/2014 1150   CALCIUM 8.7 04/26/2014 0525   ALKPHOS 46 08/29/2014 1150   ALKPHOS 45 09/29/2013 1440  AST 22 08/29/2014 1150   AST 17 09/29/2013 1440   ALT 14 08/29/2014 1150   ALT 11 09/29/2013 1440   BILITOT 0.45 08/29/2014 1150   BILITOT 0.6 09/29/2013 1440       No results found for: LABCA2  No components found for: [id number]  No results for input(s): INR in the last 168 hours.  Urinalysis    Component Value Date/Time   COLORURINE AMBER* 02/14/2013 0228   APPEARANCEUR CLOUDY* 02/14/2013 0228   LABSPEC 1.023 02/14/2013 0228   PHURINE 5.5 02/14/2013 0228   GLUCOSEU 250* 02/14/2013 0228   GLUCOSEU NEGATIVE 12/05/2011 1027   HGBUR TRACE* 02/14/2013 0228   BILIRUBINUR SMALL* 02/14/2013 0228   KETONESUR 15* 02/14/2013 0228   PROTEINUR 100* 02/14/2013 0228   UROBILINOGEN 1.0 02/14/2013 0228   NITRITE NEGATIVE 02/14/2013 0228   LEUKOCYTESUR NEGATIVE 02/14/2013 0228    STUDIES: Mammography December 2015 was unremarkable  ASSESSMENT: 64 y.o. [place], [place] woman status post right lumpectomy and sentinel lymph node sampling 11/09/2012 for a pT2 pN0,  stage IIA invasive ductal carcinoma, grade 3, triple negative, with an MIB-1 of 56%  (1) started adjuvant cyclophosphamide, methotrexate and fluorouracil 12-2012, received 3 cycles, then had a fall with injury to her left leg requiring skin grafting, and her subsequent cycles were delayed.  (2) resuming CMF treatment 05/24/2013, completed 07/05/2013  (3) adjuvant radiation in [place], [place] 08/09/2013 through 09/15/2013  PLAN:  Perlita is doing well as far as her breast cancer is concerned. She is now almost 2 years out from her definitive surgery with no evidence of recurrent disease. The labs were reviewed in detail and were entirely stable.   She still has her port, which was flushed today for the first time in several months. I am placing a referral to IR to have this removed. Her previous breast surgeon, Dr. Margot Chimes, is now retired.  Crystel is due for a repeat mammogram in December. Reeda will return in 6 months for labs and a follow up visit. She understands and agrees with this plan. She has been encouraged to call with any issues that might arise before her next visit here.   Laurie Panda, NP  09/05/2014 12:06 PM

## 2014-09-09 ENCOUNTER — Other Ambulatory Visit: Payer: Self-pay | Admitting: Radiology

## 2014-09-12 ENCOUNTER — Ambulatory Visit (HOSPITAL_COMMUNITY)
Admission: RE | Admit: 2014-09-12 | Discharge: 2014-09-12 | Disposition: A | Discharge status: Home / Self Care | Payer: Medicare Other | Source: Ambulatory Visit | Attending: Nurse Practitioner | Admitting: Nurse Practitioner

## 2014-09-12 ENCOUNTER — Encounter (HOSPITAL_COMMUNITY): Payer: Self-pay

## 2014-09-12 DIAGNOSIS — I5032 Chronic diastolic (congestive) heart failure: Secondary | ICD-10-CM | POA: Insufficient documentation

## 2014-09-12 DIAGNOSIS — I129 Hypertensive chronic kidney disease with stage 1 through stage 4 chronic kidney disease, or unspecified chronic kidney disease: Secondary | ICD-10-CM | POA: Diagnosis not present

## 2014-09-12 DIAGNOSIS — N183 Chronic kidney disease, stage 3 (moderate): Secondary | ICD-10-CM | POA: Diagnosis not present

## 2014-09-12 DIAGNOSIS — Z940 Kidney transplant status: Secondary | ICD-10-CM | POA: Diagnosis not present

## 2014-09-12 DIAGNOSIS — J45909 Unspecified asthma, uncomplicated: Secondary | ICD-10-CM | POA: Diagnosis not present

## 2014-09-12 DIAGNOSIS — K3184 Gastroparesis: Secondary | ICD-10-CM | POA: Diagnosis not present

## 2014-09-12 DIAGNOSIS — Z7952 Long term (current) use of systemic steroids: Secondary | ICD-10-CM | POA: Diagnosis not present

## 2014-09-12 DIAGNOSIS — E11319 Type 2 diabetes mellitus with unspecified diabetic retinopathy without macular edema: Secondary | ICD-10-CM | POA: Insufficient documentation

## 2014-09-12 DIAGNOSIS — Z794 Long term (current) use of insulin: Secondary | ICD-10-CM | POA: Diagnosis not present

## 2014-09-12 DIAGNOSIS — Z452 Encounter for adjustment and management of vascular access device: Secondary | ICD-10-CM | POA: Insufficient documentation

## 2014-09-12 DIAGNOSIS — Z923 Personal history of irradiation: Secondary | ICD-10-CM | POA: Insufficient documentation

## 2014-09-12 DIAGNOSIS — Z9221 Personal history of antineoplastic chemotherapy: Secondary | ICD-10-CM | POA: Diagnosis not present

## 2014-09-12 DIAGNOSIS — G500 Trigeminal neuralgia: Secondary | ICD-10-CM | POA: Diagnosis not present

## 2014-09-12 DIAGNOSIS — Z7982 Long term (current) use of aspirin: Secondary | ICD-10-CM | POA: Insufficient documentation

## 2014-09-12 DIAGNOSIS — E1143 Type 2 diabetes mellitus with diabetic autonomic (poly)neuropathy: Secondary | ICD-10-CM | POA: Diagnosis not present

## 2014-09-12 DIAGNOSIS — E039 Hypothyroidism, unspecified: Secondary | ICD-10-CM | POA: Diagnosis not present

## 2014-09-12 DIAGNOSIS — N2581 Secondary hyperparathyroidism of renal origin: Secondary | ICD-10-CM | POA: Insufficient documentation

## 2014-09-12 DIAGNOSIS — E1122 Type 2 diabetes mellitus with diabetic chronic kidney disease: Secondary | ICD-10-CM | POA: Diagnosis not present

## 2014-09-12 DIAGNOSIS — C50511 Malignant neoplasm of lower-outer quadrant of right female breast: Secondary | ICD-10-CM

## 2014-09-12 DIAGNOSIS — K219 Gastro-esophageal reflux disease without esophagitis: Secondary | ICD-10-CM | POA: Insufficient documentation

## 2014-09-12 DIAGNOSIS — Z853 Personal history of malignant neoplasm of breast: Secondary | ICD-10-CM | POA: Insufficient documentation

## 2014-09-12 DIAGNOSIS — I2510 Atherosclerotic heart disease of native coronary artery without angina pectoris: Secondary | ICD-10-CM | POA: Insufficient documentation

## 2014-09-12 DIAGNOSIS — Z87442 Personal history of urinary calculi: Secondary | ICD-10-CM | POA: Insufficient documentation

## 2014-09-12 DIAGNOSIS — I252 Old myocardial infarction: Secondary | ICD-10-CM | POA: Diagnosis not present

## 2014-09-12 DIAGNOSIS — Z8673 Personal history of transient ischemic attack (TIA), and cerebral infarction without residual deficits: Secondary | ICD-10-CM | POA: Diagnosis not present

## 2014-09-12 DIAGNOSIS — D631 Anemia in chronic kidney disease: Secondary | ICD-10-CM | POA: Insufficient documentation

## 2014-09-12 DIAGNOSIS — E785 Hyperlipidemia, unspecified: Secondary | ICD-10-CM | POA: Diagnosis not present

## 2014-09-12 DIAGNOSIS — H5442 Blindness, left eye, normal vision right eye: Secondary | ICD-10-CM | POA: Insufficient documentation

## 2014-09-12 LAB — CBC WITH DIFFERENTIAL/PLATELET
BASOS ABS: 0 10*3/uL (ref 0.0–0.1)
Basophils Relative: 0 % (ref 0–1)
Eosinophils Absolute: 0.1 10*3/uL (ref 0.0–0.7)
Eosinophils Relative: 1 % (ref 0–5)
HCT: 45.4 % (ref 36.0–46.0)
HEMOGLOBIN: 13.8 g/dL (ref 12.0–15.0)
LYMPHS ABS: 0.9 10*3/uL (ref 0.7–4.0)
LYMPHS PCT: 15 % (ref 12–46)
MCH: 28 pg (ref 26.0–34.0)
MCHC: 30.4 g/dL (ref 30.0–36.0)
MCV: 92.1 fL (ref 78.0–100.0)
Monocytes Absolute: 0.4 10*3/uL (ref 0.1–1.0)
Monocytes Relative: 7 % (ref 3–12)
NEUTROS PCT: 77 % (ref 43–77)
Neutro Abs: 4.9 10*3/uL (ref 1.7–7.7)
Platelets: 199 10*3/uL (ref 150–400)
RBC: 4.93 MIL/uL (ref 3.87–5.11)
RDW: 16.4 % — ABNORMAL HIGH (ref 11.5–15.5)
WBC: 6.3 10*3/uL (ref 4.0–10.5)

## 2014-09-12 LAB — GLUCOSE, CAPILLARY
GLUCOSE-CAPILLARY: 54 mg/dL — AB (ref 65–99)
Glucose-Capillary: 101 mg/dL — ABNORMAL HIGH (ref 65–99)
Glucose-Capillary: 70 mg/dL (ref 65–99)
Glucose-Capillary: 88 mg/dL (ref 65–99)

## 2014-09-12 LAB — PROTIME-INR
INR: 0.98 (ref 0.00–1.49)
PROTHROMBIN TIME: 13.2 s (ref 11.6–15.2)

## 2014-09-12 MED ORDER — FENTANYL CITRATE (PF) 100 MCG/2ML IJ SOLN
INTRAMUSCULAR | Status: AC | PRN
  Administered 2014-09-12: 50 ug via INTRAVENOUS

## 2014-09-12 MED ORDER — DEXTROSE-NACL 5-0.45 % IV SOLN
INTRAVENOUS | Status: DC
  Administered 2014-09-12: 15:00:00 via INTRAVENOUS

## 2014-09-12 MED ORDER — MIDAZOLAM HCL 2 MG/2ML IJ SOLN
INTRAMUSCULAR | Status: AC | PRN
Start: 2014-09-12 — End: 2014-09-12
  Administered 2014-09-12: 1 mg via INTRAVENOUS

## 2014-09-12 MED ORDER — VANCOMYCIN HCL IN DEXTROSE 1-5 GM/200ML-% IV SOLN
1000.0000 mg | Freq: Once | INTRAVENOUS | Status: AC
  Administered 2014-09-12: 1000 mg via INTRAVENOUS
  Filled 2014-09-12: qty 200

## 2014-09-12 MED ORDER — DEXTROSE 50 % IV SOLN
INTRAVENOUS | Status: AC
  Administered 2014-09-12: 25 mL
  Filled 2014-09-12: qty 50

## 2014-09-12 MED ORDER — MIDAZOLAM HCL 2 MG/2ML IJ SOLN
INTRAMUSCULAR | Status: DC
Start: 2014-09-12 — End: 2014-09-13
  Filled 2014-09-12: qty 6

## 2014-09-12 MED ORDER — DIPHENHYDRAMINE HCL 50 MG/ML IJ SOLN
25.0000 mg | INTRAMUSCULAR | Status: AC
  Administered 2014-09-12: 25 mg via INTRAVENOUS
  Filled 2014-09-12: qty 1

## 2014-09-12 MED ORDER — FENTANYL CITRATE (PF) 100 MCG/2ML IJ SOLN
INTRAMUSCULAR | Status: AC
  Filled 2014-09-12: qty 6

## 2014-09-12 MED ORDER — SODIUM CHLORIDE 0.9 % IV SOLN
INTRAVENOUS | Status: DC
Start: 2014-09-12 — End: 2014-09-12
  Administered 2014-09-12: 13:00:00 via INTRAVENOUS

## 2014-09-12 MED ORDER — LIDOCAINE HCL 1 % IJ SOLN
INTRAMUSCULAR | Status: AC
  Filled 2014-09-12: qty 20

## 2014-09-12 NOTE — Progress Notes (Signed)
Patient back from port removal in IR and complaint of itching on left arm where vancomycin had just been infused. Antibiotic was on a pump and infused per protocol over an hour. Itching all over body but no whelps, no shortness of breath, or any other issues. Called and spoke to Dr. Kathlene Cote and order for Benadryl IV given. Benadryl administered with relief and patient stated no more itching. Ate a sandwich and tolerated well. Discharge instructions reviewed with husband and patient d/c to home at 1915.

## 2014-09-12 NOTE — Discharge Instructions (Signed)
You may remove dressing on 09/14/14. Call for any elevated temperature greater than 101 degrees or redness or drainage coming from wound.

## 2014-09-12 NOTE — H&P (Signed)
Chief Complaint:  "I'm here to get my port out"  Referring Physician(s): Boelter,Heather F  History of Present Illness: Monica Lee is a 64 y.o. female with prior history of right breast cancer, s/p lumpectomy /chemoradiation 2014-15. She had rt chest wall PAC placed by Dr. Margot Chimes in 2014. She has no evidence of recurrent disease and presents now for port a cath removal.   Past Medical History  Diagnosis Date  . S/P kidney transplant     CADAVERIC --  05/2006  . Hyperlipidemia   . DJD (degenerative joint disease)   . Hx of colonic polyps   . Diabetic retinopathy     RIGHT EYE  . Hypertension   . Asthma   . Blind left eye     OCCLUDED CORNEA,  FUNDI--  FAILED SURGERY REPAIR 2003  . Chronic diastolic CHF (congestive heart failure)     a. Cardiac MRI (10/2012): Moderate to severe LVH, EF 55%, chordal SAM but no LVOT gradient, mod circumferential effusion, mild LAE.  b.  Echocardiogram (11/06/12): Severe LVH, EF 54-00%, grade 1 diastolic dysfunction, moderate effusion.  . Wears glasses   . History of myocardial infarction     2011--  S/P CARDIAC CATH ([place])  NON-OBSTRUCTIVE CAD  . GERD (gastroesophageal reflux disease)   . History of kidney stones   . History of CVA (cerebrovascular accident) without residual deficits     2008  AND TIA IN 2008  W/ NO RESIDUAL  . History of peptic ulcer     2011--  RESOLVED  . 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 Gastroparesis due to DM   . Chronic kidney disease (CKD), stage III (moderate)     NEPHROLOGIST--  DR Freddrick March  . Open thigh wound     ANTERIOR  . Coronary artery disease CARDIOLOGIST--  DR Johnsie Cancel    NON-OBSTRUCTIVE CAD  PER CARDIAC CATH  2011  . Pericardial effusion without cardiac tamponade     PER DR NISHAN  NOTE --  NOT MALIGNANT  ? RELATED TO HYPOTHYROIDISM  .  Thyromegaly     MULTINODULER GOITER  . Hyperparathyroidism, secondary renal   . Anemia in chronic kidney disease   . Trigeminal neuralgia   . Hypothyroidism   . At risk for sleep apnea     STOP-BANG= 4   SENT TO PCP  03-25-2013    Past Surgical History  Procedure Laterality Date  . Transplantation renal  05/2006    CADAVERIC  . Breast biopsy  2010  . Cholecystectomy  2008  . Hemorrhoid surgery  01/23/2012    Procedure: HEMORRHOIDECTOMY PROLAPSED;  Surgeon: Leighton Ruff, MD;  Location: [facility];  Service: General;  Laterality: N/A;  . Foot surgery Right 2013  . Breast lumpectomy with sentinel lymph node biopsy Right 11/09/2012    Procedure: RIGHT BREAST LUMPECTOMY WITH SENTINEL LYMPH NODE REMOVAL;  Surgeon: Haywood Lasso, MD;  Location: [place];  Service: General;  Laterality: Right;  . Portacath placement Right 11/30/2012    Procedure: INSERTION PORT-A-CATH;  Surgeon: Haywood Lasso, MD;  Location: [place];  Service: General;  Laterality: Right;  . Irrigation and debridement abscess Left 02/14/2013    Procedure: IRRIGATION AND DEBRIDEMENT LEFT THIGH ABSCESS;  Surgeon: Joyice Faster. Cornett, MD;  Location: [facility];  Service: General;  Laterality: Left;  . Incision and drainage of wound Left 02/18/2013    Procedure: IRRIGATION AND DEBRIDEMENT THIGH WOUND;  Surgeon: Rolm Bookbinder, MD;  Location: [place];  Service: General;  Laterality: Left;  . Cataract extraction w/ intraocular lens  implant, bilateral    . Repair  intestinal perforation surgery  01-31-2009  . Cardiac catheterization  04-12-2009  DR EVELYNE GOUDREAU ([facility] [place], [place])    NON-OBSTRUCTIVE CAD/  mLAD 40%,  oLAD 50%,  dCFX 40%,  OM1  40%,  mRCA  &  dRCA  50%/  LVEF 65% /  ELEVATED  LVEDP  . Transthoracic echocardiogram  01-19-2013  DR NISHAN    SEVERE LVH/  EF 66-44%/  GRADE I DIASTOLIC DYSFUNCTION/  MODERATE LAE/  MILD IMPROVEMENT OF MODERATE CIRCUMFERENTIAL INFERIOROLATERAL  PERICARDIAL EFFUSION WITH NO TAMPONADE  . Cardiovascular stress test  11-04-2012  DR NISHAN    HIGH RISK NUCLEAR STUDY/  FIXED DEFECT AFFECTING ENTIRE INFEROLATERAL AND ANTEROLATERAL WALL/  MODERATE ISCHEMIA  AT MID/ APICAL ANTEROR AND INFERIOR WALL/  GLOBAL HYPOKINESIS/  LVEF 28% (FELT TO BE FALSE +,  ECHO & cMRI  NORMAL EF AND NORMAL WM)  . Vaginal hysterectomy  1990's    W/  BILATERAL SALPINGOOPHORECTOMY  . Eye surgery Left 2003    REPAIR CORNEA  . Skin split graft Left 03/29/2013    Procedure: SKIN GRAFT SPLIT THICKNESS WITH A-CELL AND VAC TO LEFT THIGH;  Surgeon: Theodoro Kos, DO;  Location: [place];  Service: Plastics;  Laterality: Left;    Allergies: Keflex and Propoxyphene n-acetaminophen  Medications: Prior to Admission medications   Medication Sig Start Date End Date Taking? Authorizing Provider  albuterol (ACCUNEB) 0.63 MG/3ML nebulizer solution Take 1 ampule by nebulization every 6 (six) hours as needed for wheezing.   Yes Historical Provider, MD  albuterol (PROVENTIL HFA;VENTOLIN HFA) 108 (90 BASE) MCG/ACT inhaler Inhale 2 puffs into the lungs every 6 (six) hours as needed for wheezing or shortness of breath. 01/04/14  Yes Biagio Borg, MD  amLODipine (NORVASC) 5 MG tablet Take 5 mg by mouth every evening.    Yes Historical Provider, MD  aspirin EC 81 MG tablet Take 81 mg by mouth daily.   Yes Historical Provider, MD  azaTHIOprine (IMURAN) 50 MG tablet Take 75 mg by mouth daily.    Yes Historical Provider, MD  brimonidine (ALPHAGAN) 0.2 % ophthalmic solution Place 1 drop into the right eye 2 (two) times daily.  03/10/13  Yes Historical Provider, MD  docusate sodium (COLACE) 50 MG capsule Take 200 mg by mouth 2 (two) times daily.   Yes Historical Provider, MD  feeding supplement, GLUCERNA SHAKE, (GLUCERNA SHAKE) LIQD Take 237 mLs by mouth 2 (two) times daily between meals. 02/23/13  Yes Hosie Poisson, MD  furosemide (LASIX) 80 MG tablet Take 160 mg by mouth 2  (two) times daily.    Yes Historical Provider, MD  insulin NPH-regular Human (NOVOLIN 70/30) (70-30) 100 UNIT/ML injection 15-40 units inject under skin twice a day 08/25/14  Yes Philemon Kingdom, MD  isosorbide mononitrate (IMDUR) 60 MG 24 hr tablet TAKE 1 TABLET BY MOUTH EVERY DAY 01/21/14  Yes Biagio Borg, MD  KLOR-CON M20 20 MEQ tablet Take 10 mEq by mouth 2 (two) times daily. Takes 1/2 tab in morning and 1/2 at night. 02/11/13  Yes Historical Provider, MD  levothyroxine (SYNTHROID, LEVOTHROID) 75 MCG tablet Take 1 tablet (75 mcg total) by mouth  daily. 03/25/13  Yes Biagio Borg, MD  magnesium oxide (MAG-OX) 400 MG tablet Take 400 mg by mouth 2 (two) times daily.   Yes Historical Provider, MD  metoprolol (LOPRESSOR) 100 MG tablet TAKE 1 TABLET BY MOUTH TWICE DAILY 08/10/14  Yes Josue Hector, MD  minoxidil (LONITEN) 2.5 MG tablet TAKE 1 TABLET BY MOUTH TWICE DAILY 08/09/14  Yes Biagio Borg, MD  polyethylene glycol [facility] / GLYCOLAX) packet Take 17 g by mouth daily.  09/29/13  Yes Historical Provider, MD  pravastatin (PRAVACHOL) 20 MG tablet Take 10 mg by mouth at bedtime.     Yes Historical Provider, MD  prednisoLONE acetate (PRED FORTE) 1 % ophthalmic suspension Place 1 drop into the left eye 2 (two) times daily.    Yes Historical Provider, MD  predniSONE (DELTASONE) 10 MG tablet Take 10 mg by mouth every morning.    Yes Historical Provider, MD  tacrolimus (PROGRAF) 1 MG capsule Take 9 mg by mouth 2 (two) times daily.    Yes Historical Provider, MD  timolol (TIMOPTIC) 0.25 % ophthalmic solution Place 1 drop into the right eye 2 (two) times daily.   Yes Historical Provider, MD  nitroGLYCERIN (NITROSTAT) 0.4 MG SL tablet Place 1 tablet (0.4 mg total) under the tongue every 5 (five) minutes as needed for chest pain. Patient not taking: Reported on 09/05/2014 04/13/14   Josue Hector, MD     Family History  Problem Relation Age of Onset  . Kidney disease Neg Hx   . Arthritis Other     parent  .  Heart disease Other     parent  . Hypertension Other     parent  . Diabetes Other     parent  . Diabetes Other     grandparent  . Thyroid disease Other     several  . Heart disease Father     Social History   Social History  . Marital Status: Married    Spouse Name: N/A  . Number of Children: 4  . Years of Education: N/A   Occupational History  . Disables/SSI since 2002    Social History Main Topics  . Smoking status: Never Smoker   . Smokeless tobacco: Never Used  . Alcohol Use: No  . Drug Use: No  . Sexual Activity: Yes   Other Topics Concern  . None   Social History Narrative      Review of Systems  Constitutional: Negative for fever and chills.  Eyes:       Blind left eye  Respiratory: Negative for cough and shortness of breath.   Cardiovascular: Negative for chest pain.  Gastrointestinal: Negative for nausea, vomiting, abdominal pain and blood in stool.  Genitourinary: Negative for dysuria and hematuria.  Musculoskeletal: Negative for back pain.  Neurological: Negative for headaches.    Vital Signs: BP 169/55 HR 65  R 18  O2 SATS 100%  TEMP 97.7  Physical Exam  Constitutional: She is oriented to person, place, and time. She appears well-developed and well-nourished.  Cardiovascular: Normal rate and regular rhythm.   Pulmonary/Chest: Effort normal.  Sl dim BS bases; clean, intact rt chest wall PAC   Abdominal: Soft. Bowel sounds are normal. There is no tenderness.  Musculoskeletal: Normal range of motion.  Neurological: She is alert and oriented to person, place, and time.    Mallampati Score:     Imaging: No results found.  Labs:  CBC:  Recent Labs  03/07/14 1128 04/24/14 1838  08/29/14 1150 09/12/14 1305  WBC 9.2 4.8 7.6 6.3  HGB 12.5 12.3 13.8 13.8  HCT 39.4 40.4 44.7 45.4  PLT 222 228 212 199    COAGS:  Recent Labs  04/25/14 0120 09/12/14 1305  INR 1.04 0.98    BMP:  Recent Labs  01/28/14 2243  04/24/14 1838  04/25/14 0807 04/26/14 0525 08/29/14 1150  NA 142  < > 138 139 138 142  K 4.2  < > 5.2* 4.2 3.9 4.3  CL 101  --  100 98 98  --   CO2  --   < > 34* 31 33* 31*  GLUCOSE 223*  < > 163* 243* 253* 118  BUN 26*  < > 26* 23 28* 23.2  CALCIUM  --   < > 9.4 9.2 8.7 9.5  CREATININE 1.40*  < > 1.84* 1.62* 1.35* 1.0  GFRNONAA  --   --  28* 33* 41*  --   GFRAA  --   --  33* 38* 47*  --   < > = values in this interval not displayed.  LIVER FUNCTION TESTS:  Recent Labs  09/29/13 1440 12/07/13 1150 03/07/14 1128 08/29/14 1150  BILITOT 0.6 0.43 0.28 0.45  AST 17 21 22 22   ALT 11 14 17 14   ALKPHOS 45 55 60 46  PROT 7.5 7.4 6.8 7.1  ALBUMIN 4.1 4.2 3.7 4.0    TUMOR MARKERS: No results for input(s): AFPTM, CEA, CA199, CHROMGRNA in the last 8760 hours.  Assessment and Plan: Monica Lee is a 64 y.o. female with prior history of right breast cancer, s/p lumpectomy /chemoradiation 2014-15. She had rt chest wall PAC placed by Dr. Margot Chimes in 2014. She has no evidence of recurrent disease and presents now for port a cath removal.Details/risks of procedure , including but not limited to internal bleeding, infection d/w pt/husband with their understanding and consent.   Thank you for this interesting consult.  I greatly enjoyed meeting Monica Lee and look forward to participating in their care.  A copy of this report was sent to the requesting provider on this date.  Signed: D. Rowe Robert 09/12/2014, 2:39 PM   I spent a total of 15 minutes in face to face in clinical consultation, greater than 50% of which was counseling/coordinating care for port a cath removal.

## 2014-09-12 NOTE — Procedures (Signed)
Procedure:  Port removal Findings:  Right chest port removed in entirety.  Wound closed. No complications.

## 2014-10-12 DIAGNOSIS — H2511 Age-related nuclear cataract, right eye: Secondary | ICD-10-CM | POA: Diagnosis not present

## 2014-10-19 DIAGNOSIS — H2511 Age-related nuclear cataract, right eye: Secondary | ICD-10-CM | POA: Diagnosis not present

## 2014-10-19 DIAGNOSIS — H25811 Combined forms of age-related cataract, right eye: Secondary | ICD-10-CM | POA: Diagnosis not present

## 2014-10-19 LAB — HM DIABETES EYE EXAM

## 2014-10-23 DIAGNOSIS — Z888 Allergy status to other drugs, medicaments and biological substances status: Secondary | ICD-10-CM | POA: Diagnosis not present

## 2014-10-23 DIAGNOSIS — I10 Essential (primary) hypertension: Secondary | ICD-10-CM | POA: Diagnosis not present

## 2014-10-23 DIAGNOSIS — N289 Disorder of kidney and ureter, unspecified: Secondary | ICD-10-CM | POA: Diagnosis not present

## 2014-10-23 DIAGNOSIS — I509 Heart failure, unspecified: Secondary | ICD-10-CM | POA: Diagnosis not present

## 2014-10-23 DIAGNOSIS — Z794 Long term (current) use of insulin: Secondary | ICD-10-CM | POA: Diagnosis not present

## 2014-10-23 DIAGNOSIS — M10071 Idiopathic gout, right ankle and foot: Secondary | ICD-10-CM | POA: Diagnosis not present

## 2014-10-23 DIAGNOSIS — Z940 Kidney transplant status: Secondary | ICD-10-CM | POA: Diagnosis not present

## 2014-10-23 DIAGNOSIS — E119 Type 2 diabetes mellitus without complications: Secondary | ICD-10-CM | POA: Diagnosis not present

## 2014-10-23 DIAGNOSIS — M10371 Gout due to renal impairment, right ankle and foot: Secondary | ICD-10-CM | POA: Diagnosis not present

## 2014-11-02 DIAGNOSIS — H44522 Atrophy of globe, left eye: Secondary | ICD-10-CM | POA: Diagnosis not present

## 2014-11-02 DIAGNOSIS — E113591 Type 2 diabetes mellitus with proliferative diabetic retinopathy without macular edema, right eye: Secondary | ICD-10-CM | POA: Diagnosis not present

## 2014-11-02 DIAGNOSIS — H35371 Puckering of macula, right eye: Secondary | ICD-10-CM | POA: Diagnosis not present

## 2014-11-08 ENCOUNTER — Other Ambulatory Visit: Payer: Self-pay | Admitting: Internal Medicine

## 2014-11-17 DIAGNOSIS — E039 Hypothyroidism, unspecified: Secondary | ICD-10-CM | POA: Diagnosis present

## 2014-11-17 DIAGNOSIS — I10 Essential (primary) hypertension: Secondary | ICD-10-CM | POA: Diagnosis not present

## 2014-11-17 DIAGNOSIS — Z7952 Long term (current) use of systemic steroids: Secondary | ICD-10-CM | POA: Diagnosis not present

## 2014-11-17 DIAGNOSIS — L03311 Cellulitis of abdominal wall: Secondary | ICD-10-CM | POA: Diagnosis not present

## 2014-11-17 DIAGNOSIS — E785 Hyperlipidemia, unspecified: Secondary | ICD-10-CM | POA: Diagnosis not present

## 2014-11-17 DIAGNOSIS — L02211 Cutaneous abscess of abdominal wall: Secondary | ICD-10-CM | POA: Diagnosis not present

## 2014-11-17 DIAGNOSIS — E1122 Type 2 diabetes mellitus with diabetic chronic kidney disease: Secondary | ICD-10-CM | POA: Diagnosis present

## 2014-11-17 DIAGNOSIS — I361 Nonrheumatic tricuspid (valve) insufficiency: Secondary | ICD-10-CM | POA: Diagnosis not present

## 2014-11-17 DIAGNOSIS — Z794 Long term (current) use of insulin: Secondary | ICD-10-CM | POA: Diagnosis not present

## 2014-11-17 DIAGNOSIS — K654 Sclerosing mesenteritis: Secondary | ICD-10-CM | POA: Diagnosis not present

## 2014-11-17 DIAGNOSIS — J45909 Unspecified asthma, uncomplicated: Secondary | ICD-10-CM | POA: Diagnosis present

## 2014-11-17 DIAGNOSIS — Z853 Personal history of malignant neoplasm of breast: Secondary | ICD-10-CM | POA: Diagnosis not present

## 2014-11-17 DIAGNOSIS — N179 Acute kidney failure, unspecified: Secondary | ICD-10-CM | POA: Diagnosis not present

## 2014-11-17 DIAGNOSIS — J9691 Respiratory failure, unspecified with hypoxia: Secondary | ICD-10-CM | POA: Diagnosis not present

## 2014-11-17 DIAGNOSIS — M1990 Unspecified osteoarthritis, unspecified site: Secondary | ICD-10-CM | POA: Diagnosis present

## 2014-11-17 DIAGNOSIS — J9601 Acute respiratory failure with hypoxia: Secondary | ICD-10-CM | POA: Diagnosis not present

## 2014-11-17 DIAGNOSIS — Z23 Encounter for immunization: Secondary | ICD-10-CM | POA: Diagnosis not present

## 2014-11-17 DIAGNOSIS — I5030 Unspecified diastolic (congestive) heart failure: Secondary | ICD-10-CM | POA: Diagnosis not present

## 2014-11-17 DIAGNOSIS — I509 Heart failure, unspecified: Secondary | ICD-10-CM | POA: Diagnosis not present

## 2014-11-17 DIAGNOSIS — I5033 Acute on chronic diastolic (congestive) heart failure: Secondary | ICD-10-CM | POA: Diagnosis not present

## 2014-11-17 DIAGNOSIS — H5442 Blindness, left eye, normal vision right eye: Secondary | ICD-10-CM | POA: Diagnosis present

## 2014-11-17 DIAGNOSIS — I319 Disease of pericardium, unspecified: Secondary | ICD-10-CM | POA: Diagnosis not present

## 2014-11-17 DIAGNOSIS — Z8711 Personal history of peptic ulcer disease: Secondary | ICD-10-CM | POA: Diagnosis not present

## 2014-11-17 DIAGNOSIS — I313 Pericardial effusion (noninflammatory): Secondary | ICD-10-CM | POA: Diagnosis not present

## 2014-11-17 DIAGNOSIS — Z79899 Other long term (current) drug therapy: Secondary | ICD-10-CM | POA: Diagnosis not present

## 2014-11-17 DIAGNOSIS — I371 Nonrheumatic pulmonary valve insufficiency: Secondary | ICD-10-CM | POA: Diagnosis not present

## 2014-11-17 DIAGNOSIS — Z940 Kidney transplant status: Secondary | ICD-10-CM | POA: Diagnosis not present

## 2014-11-17 DIAGNOSIS — R0902 Hypoxemia: Secondary | ICD-10-CM | POA: Diagnosis not present

## 2014-11-17 DIAGNOSIS — I2510 Atherosclerotic heart disease of native coronary artery without angina pectoris: Secondary | ICD-10-CM | POA: Diagnosis present

## 2014-11-17 DIAGNOSIS — Z7982 Long term (current) use of aspirin: Secondary | ICD-10-CM | POA: Diagnosis not present

## 2014-11-17 DIAGNOSIS — E669 Obesity, unspecified: Secondary | ICD-10-CM | POA: Diagnosis present

## 2014-11-17 DIAGNOSIS — R0602 Shortness of breath: Secondary | ICD-10-CM | POA: Diagnosis not present

## 2014-11-17 DIAGNOSIS — I132 Hypertensive heart and chronic kidney disease with heart failure and with stage 5 chronic kidney disease, or end stage renal disease: Secondary | ICD-10-CM | POA: Diagnosis not present

## 2014-11-17 DIAGNOSIS — N186 End stage renal disease: Secondary | ICD-10-CM | POA: Diagnosis not present

## 2014-11-17 DIAGNOSIS — Z888 Allergy status to other drugs, medicaments and biological substances status: Secondary | ICD-10-CM | POA: Diagnosis not present

## 2014-11-23 DIAGNOSIS — I2510 Atherosclerotic heart disease of native coronary artery without angina pectoris: Secondary | ICD-10-CM | POA: Diagnosis not present

## 2014-11-23 DIAGNOSIS — Z8673 Personal history of transient ischemic attack (TIA), and cerebral infarction without residual deficits: Secondary | ICD-10-CM | POA: Diagnosis not present

## 2014-11-23 DIAGNOSIS — Z7952 Long term (current) use of systemic steroids: Secondary | ICD-10-CM | POA: Diagnosis not present

## 2014-11-23 DIAGNOSIS — I110 Hypertensive heart disease with heart failure: Secondary | ICD-10-CM | POA: Diagnosis not present

## 2014-11-23 DIAGNOSIS — M1990 Unspecified osteoarthritis, unspecified site: Secondary | ICD-10-CM | POA: Diagnosis not present

## 2014-11-23 DIAGNOSIS — J9691 Respiratory failure, unspecified with hypoxia: Secondary | ICD-10-CM | POA: Diagnosis not present

## 2014-11-23 DIAGNOSIS — I5033 Acute on chronic diastolic (congestive) heart failure: Secondary | ICD-10-CM | POA: Diagnosis not present

## 2014-11-23 DIAGNOSIS — E119 Type 2 diabetes mellitus without complications: Secondary | ICD-10-CM | POA: Diagnosis not present

## 2014-11-23 DIAGNOSIS — Z794 Long term (current) use of insulin: Secondary | ICD-10-CM | POA: Diagnosis not present

## 2014-11-23 DIAGNOSIS — Z940 Kidney transplant status: Secondary | ICD-10-CM | POA: Diagnosis not present

## 2014-11-23 DIAGNOSIS — Z853 Personal history of malignant neoplasm of breast: Secondary | ICD-10-CM | POA: Diagnosis not present

## 2014-11-23 DIAGNOSIS — J45909 Unspecified asthma, uncomplicated: Secondary | ICD-10-CM | POA: Diagnosis not present

## 2014-11-23 DIAGNOSIS — L02211 Cutaneous abscess of abdominal wall: Secondary | ICD-10-CM | POA: Diagnosis not present

## 2014-11-23 DIAGNOSIS — Z7982 Long term (current) use of aspirin: Secondary | ICD-10-CM | POA: Diagnosis not present

## 2014-11-24 ENCOUNTER — Telehealth: Payer: Self-pay | Admitting: Internal Medicine

## 2014-11-24 NOTE — Telephone Encounter (Signed)
Please call Armando Gang from [facility]. She wants to do a medication review.  [phone]

## 2014-11-25 ENCOUNTER — Other Ambulatory Visit: Payer: Self-pay

## 2014-11-25 ENCOUNTER — Inpatient Hospital Stay: Payer: Medicare Other | Admitting: Internal Medicine

## 2014-11-25 DIAGNOSIS — Z0289 Encounter for other administrative examinations: Secondary | ICD-10-CM

## 2014-11-25 MED ORDER — ISOSORBIDE MONONITRATE ER 60 MG PO TB24: 60.0000 mg | ORAL_TABLET | Freq: Every day | ORAL | Status: DC

## 2014-11-26 DIAGNOSIS — I2510 Atherosclerotic heart disease of native coronary artery without angina pectoris: Secondary | ICD-10-CM | POA: Diagnosis not present

## 2014-11-26 DIAGNOSIS — I110 Hypertensive heart disease with heart failure: Secondary | ICD-10-CM | POA: Diagnosis not present

## 2014-11-26 DIAGNOSIS — I5033 Acute on chronic diastolic (congestive) heart failure: Secondary | ICD-10-CM | POA: Diagnosis not present

## 2014-11-26 DIAGNOSIS — L02211 Cutaneous abscess of abdominal wall: Secondary | ICD-10-CM | POA: Diagnosis not present

## 2014-11-26 DIAGNOSIS — E119 Type 2 diabetes mellitus without complications: Secondary | ICD-10-CM | POA: Diagnosis not present

## 2014-11-26 DIAGNOSIS — J45909 Unspecified asthma, uncomplicated: Secondary | ICD-10-CM | POA: Diagnosis not present

## 2014-11-28 ENCOUNTER — Ambulatory Visit (INDEPENDENT_AMBULATORY_CARE_PROVIDER_SITE_OTHER): Payer: Medicare Other | Admitting: Internal Medicine

## 2014-11-28 ENCOUNTER — Encounter: Payer: Self-pay | Admitting: Internal Medicine

## 2014-11-28 ENCOUNTER — Other Ambulatory Visit (INDEPENDENT_AMBULATORY_CARE_PROVIDER_SITE_OTHER): Payer: Medicare Other | Admitting: *Deleted

## 2014-11-28 VITALS — BP 122/78 | HR 68 | Temp 97.6°F | Resp 12 | Wt 186.0 lb

## 2014-11-28 DIAGNOSIS — Z794 Long term (current) use of insulin: Secondary | ICD-10-CM

## 2014-11-28 DIAGNOSIS — I2510 Atherosclerotic heart disease of native coronary artery without angina pectoris: Secondary | ICD-10-CM

## 2014-11-28 DIAGNOSIS — E1159 Type 2 diabetes mellitus with other circulatory complications: Secondary | ICD-10-CM

## 2014-11-28 LAB — POCT GLYCOSYLATED HEMOGLOBIN (HGB A1C): HEMOGLOBIN A1C: 7.5

## 2014-11-28 NOTE — Progress Notes (Signed)
Patient ID: Monica Lee, female   DOB: [birth year], 64 y.o.   MRN: [id number]  HPI: Monica Lee is a 64 y.o.-year-old woman, returning for f/u for DM2, dx 1980s, insulin-dependent, uncontrolled, with multiple complications (diabetic retinopathy, history of cadaveric kidney transplant in 05/2006 in [place] - now with CKD stage III, gastroparesis, CAD-history of MI in [id number], CHF- diastolic dysfunction, history of TIA and stroke in 2008). Last visit 3 mo ago. She is here with her daughter who offers part of the hx.  She had a skin abscess >> hospitalized for 4 days this month.   Last hemoglobin A1c was: Lab Results  Component Value Date   HGBA1C 7.5 08/25/2014   HGBA1C 9.9* 09/29/2013   HGBA1C 9.5* 06/01/2013  Received labs from [facility] (Dr. Jimmy Footman) from 08/31/2013: - HbA1c 10.6%. Per dr's note, she ran out of Lantus and has not been taking it regularly before running out, either. He gave her samples.   She is on Prednisone 10 mg daily for her kidney transplant.   Pt was on a regimen of: - Lantus 55 >> 45 units (vials) - Novolog 13-13-16 >> 15-15-17 units (vials) - Novolog SSI: target 150, ISF 10  She was on: Insulin Before breakfast Before lunch Before dinner  Regular 10 units before a small meal 14 units before a regular meal 18 units before a larger meal 14 units before a regular meal 18 units before a larger meal 14 units before a regular meal 18 units before a larger meal  NPH 28  20   Continue current sliding scale added to the Regular insulin dose:  - 150- 160: + 1 unit  - 161- 170: + 2 units  - 171- 180: + 3 units  - 181- 190: + 4 units  - >190: + 5 units  PCP changed her regimen to 70/30 in 05/2014 >> we optimized these at last visit:   First dose: before breakfast   <100: 15 units 100-150: 30 units 150-200: 35 units >200: 40 units   Second dose: move this before dinner  <100: 15 units 100-150: 30 units 150-200: 35 units >200: 40 units  Pt checks her sugars  3-4x a day and they are (+ log today): - am:50 x 1, [id number] >> 97-160 >>112-231 >> 103-165, 198, 241, 281 >> 78-281 >> 64-257 - pre-lunch: 88-160s >> 114-174, 252 x1 >> 135 >> 113, 127-213 >> 29, 116-164, 186, 288 >> 96-222, 262 >> 80-187, 253 - pre-dinner: 75-150s  >> 113-160, 199 x1 >> 120-130 >> 112, 116-216, 325 >> 111-134 >> 100-180, 251 >> 60-192 - bedtime: 101-198, mostly 100-150 >> 90-160 >> 125, 147, 202 >> 150s >> n/c >> 141, 179 Lowest 29 x1 when came from [place] on 12/14/2013; she has hypoglycemia awareness at 80.  Highest sugar was 240 >> 325 >> 281 >> 345 x1.  - pt has chronic kidney disease, last BUN, creatinine: BUN  Date Value Ref Range Status  08/29/2014 23.2 7.0 - 26.0 mg/dL Final   CREATININE  Date Value Ref Range Status  08/29/2014 1.0 0.6 - 1.1 mg/dL Final  Seen by nephrology.  - Last set of lipids: Lab Results  Component Value Date   CHOL 134 09/29/2013   HDL 35.80* 09/29/2013   LDLCALC 68 09/04/2010   LDLDIRECT 57.7 09/29/2013   TRIG 264.0* 09/29/2013   CHOLHDL 4 09/29/2013   - last eye exam was on 10/19/2014. She had surgeries in the left eye for bleeding. She has cataracts. - Denies  numbness and tingling in her legs. She sees podiatry.  She also has a history of hypertension, hyperlipidemia, GERD, DJD, headaches, history of nephrolithiasis, history of anemia, cataracts status post surgery, history of hyperthyroidism. 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 She then fell and developed a hematoma in L leg >> vacuum >> surgery.   She was hospitalized for dCHF exacerbation + ARI (? If from Bactrim) with fluid overload, including pericardial effusion.   I reviewed pt's medications, allergies, PMH, social hx, family hx, and changes were documented in the history of present illness. Otherwise, unchanged from my initial visit note.  ROS: Constitutional: + weight gain and loss, no fatigue, + subjective hyperthermia Eyes: no blurry  vision, no xerophthalmia ENT: no sore throat, no nodules palpated in throat, no dysphagia/odynophagia, no hoarseness Cardiovascular: no CP/+ SOB/+ palpitations/+ leg swelling Respiratory: no cough/+ SOB/no wheezing Gastrointestinal: no N/V/D/C Musculoskeletal: no muscle/joint aches Skin: no rashes + HAs  PE: BP 122/78 mmHg  Pulse 68  Temp(Src) 97.6 F (36.4 C) (Oral)  Resp 12  Wt 186 lb (84.369 kg)  SpO2 95% Body mass index is 30.95 kg/(m^2). Wt Readings from Last 3 Encounters:  11/28/14 186 lb (84.369 kg)  09/05/14 194 lb 8 oz (88.225 kg)  08/25/14 193 lb 9.6 oz (87.816 kg)   Constitutional: overweight, in NAD Eyes: surgical left pupil, EOMI, no exophthalmos ENT: moist mucous membranes, no thyromegaly, no cervical lymphadenopathy Cardiovascular: RRR, No MRG Respiratory: CTA B Gastrointestinal: abdomen soft, NT, ND, BS+ Musculoskeletal: no deformities, strength intact in all 4 Skin: moist, warm Neurological: no tremor with outstretched hands, DTR normal in all 4  ASSESSMENT: 1. 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 Jimmy Footman - gastroparesis - CAD-history of MI in 2011, last cardiac cath was in [id number] - CHF-diastolic dysfunction - history of TIA and stroke in 2008  PLAN:  1. Patient with very long-standing diabetes, now on 70/30 insulin, started in 05/2014 by PCP. She has very fluctuating sugars, but most are in the target range. She sometimes avoids insulin (if <100 CBG) >> sugars increase after this. Also, she sometimes does not take the evening 70/30 dose to avoid lows in am. I will adjust the doses down so she does not drop her sugars to the 60's. We did Discuss about the fact that the 70/30 regimen is not flexible, and we may need to go back to basal-bolus regimen.  Patient Instructions  Please continue Novolin 70/30 - inject this 15-30 min  before a meal:   First dose: before breakfast:  < 80: no insulin  80-100: 10 units 100-150: 25 units 150-200: 30 units >200: 35 units   Second dose: before dinner:  < 80: no insulin  80-100: 10 units 100-150: 20 units 150-200: 25 units >200: 30 units  Please return in 3 months with your sugar log.  - UTD with eye exams and flu shot - will check a hemoglobin A1c today >> 7.5% (stable) - I will see her back in 1.5 months with her sugar log

## 2014-11-28 NOTE — Patient Instructions (Signed)
Please continue Novolin 70/30 - inject this 15-30 min before a meal:   First dose: before breakfast:  < 80: no insulin  80-100: 10 units 100-150: 25 units 150-200: 30 units >200: 35 units   Second dose: before dinner:  < 80: no insulin  80-100: 10 units 100-150: 20 units 150-200: 25 units >200: 30 units  Please return in 3 months with your sugar log.

## 2014-11-30 DIAGNOSIS — J45909 Unspecified asthma, uncomplicated: Secondary | ICD-10-CM | POA: Diagnosis not present

## 2014-11-30 DIAGNOSIS — I110 Hypertensive heart disease with heart failure: Secondary | ICD-10-CM | POA: Diagnosis not present

## 2014-11-30 DIAGNOSIS — I2510 Atherosclerotic heart disease of native coronary artery without angina pectoris: Secondary | ICD-10-CM | POA: Diagnosis not present

## 2014-11-30 DIAGNOSIS — L02211 Cutaneous abscess of abdominal wall: Secondary | ICD-10-CM | POA: Diagnosis not present

## 2014-11-30 DIAGNOSIS — E119 Type 2 diabetes mellitus without complications: Secondary | ICD-10-CM | POA: Diagnosis not present

## 2014-11-30 DIAGNOSIS — I5033 Acute on chronic diastolic (congestive) heart failure: Secondary | ICD-10-CM | POA: Diagnosis not present

## 2014-12-01 ENCOUNTER — Telehealth: Payer: Self-pay | Admitting: Internal Medicine

## 2014-12-01 ENCOUNTER — Other Ambulatory Visit: Payer: Self-pay | Admitting: Adult Health

## 2014-12-01 ENCOUNTER — Other Ambulatory Visit: Payer: Self-pay | Admitting: Internal Medicine

## 2014-12-01 DIAGNOSIS — Z853 Personal history of malignant neoplasm of breast: Secondary | ICD-10-CM

## 2014-12-01 DIAGNOSIS — L02211 Cutaneous abscess of abdominal wall: Secondary | ICD-10-CM | POA: Diagnosis not present

## 2014-12-01 NOTE — Telephone Encounter (Signed)
Patient is requesting a refill for predniSONE (DELTASONE) 10 MG tablet [id number] Pharmacy is [facility] in [place].

## 2014-12-01 NOTE — Telephone Encounter (Signed)
Very sorry, I would not feel comfortable with prescribing this, since I have not done so in the past; please consider having refilled by prior MD responsible

## 2014-12-01 NOTE — Telephone Encounter (Signed)
Please advise, it doesn't look like you have every Rx'd this medication for pt

## 2014-12-02 DIAGNOSIS — E119 Type 2 diabetes mellitus without complications: Secondary | ICD-10-CM | POA: Diagnosis not present

## 2014-12-02 DIAGNOSIS — I110 Hypertensive heart disease with heart failure: Secondary | ICD-10-CM | POA: Diagnosis not present

## 2014-12-02 DIAGNOSIS — J45909 Unspecified asthma, uncomplicated: Secondary | ICD-10-CM | POA: Diagnosis not present

## 2014-12-02 DIAGNOSIS — L02211 Cutaneous abscess of abdominal wall: Secondary | ICD-10-CM | POA: Diagnosis not present

## 2014-12-02 DIAGNOSIS — I5033 Acute on chronic diastolic (congestive) heart failure: Secondary | ICD-10-CM | POA: Diagnosis not present

## 2014-12-02 DIAGNOSIS — I2510 Atherosclerotic heart disease of native coronary artery without angina pectoris: Secondary | ICD-10-CM | POA: Diagnosis not present

## 2014-12-02 MED ORDER — PREDNISONE 10 MG PO TABS: 10.0000 mg | ORAL_TABLET | Freq: Every morning | ORAL | Status: DC

## 2014-12-02 NOTE — Telephone Encounter (Signed)
Dr. Jenny Reichmann did last Rx 08/30/2014. Rx refilled per EPIC

## 2014-12-02 NOTE — Telephone Encounter (Signed)
Pt says pharmacy told her Dr. Gwynn Burly name is on the prescription. It does look like he prescribed it back in August of this year  Please advise

## 2014-12-05 DIAGNOSIS — L02211 Cutaneous abscess of abdominal wall: Secondary | ICD-10-CM | POA: Diagnosis not present

## 2014-12-05 DIAGNOSIS — I5033 Acute on chronic diastolic (congestive) heart failure: Secondary | ICD-10-CM | POA: Diagnosis not present

## 2014-12-05 DIAGNOSIS — J45909 Unspecified asthma, uncomplicated: Secondary | ICD-10-CM | POA: Diagnosis not present

## 2014-12-05 DIAGNOSIS — E119 Type 2 diabetes mellitus without complications: Secondary | ICD-10-CM | POA: Diagnosis not present

## 2014-12-05 DIAGNOSIS — I110 Hypertensive heart disease with heart failure: Secondary | ICD-10-CM | POA: Diagnosis not present

## 2014-12-05 DIAGNOSIS — I2510 Atherosclerotic heart disease of native coronary artery without angina pectoris: Secondary | ICD-10-CM | POA: Diagnosis not present

## 2014-12-07 DIAGNOSIS — I5033 Acute on chronic diastolic (congestive) heart failure: Secondary | ICD-10-CM | POA: Diagnosis not present

## 2014-12-07 DIAGNOSIS — L02211 Cutaneous abscess of abdominal wall: Secondary | ICD-10-CM | POA: Diagnosis not present

## 2014-12-07 DIAGNOSIS — J45909 Unspecified asthma, uncomplicated: Secondary | ICD-10-CM | POA: Diagnosis not present

## 2014-12-07 DIAGNOSIS — I2510 Atherosclerotic heart disease of native coronary artery without angina pectoris: Secondary | ICD-10-CM | POA: Diagnosis not present

## 2014-12-07 DIAGNOSIS — E119 Type 2 diabetes mellitus without complications: Secondary | ICD-10-CM | POA: Diagnosis not present

## 2014-12-07 DIAGNOSIS — I110 Hypertensive heart disease with heart failure: Secondary | ICD-10-CM | POA: Diagnosis not present

## 2014-12-10 ENCOUNTER — Other Ambulatory Visit: Payer: Self-pay | Admitting: Internal Medicine

## 2014-12-13 ENCOUNTER — Telehealth: Payer: Self-pay | Admitting: *Deleted

## 2014-12-13 DIAGNOSIS — I110 Hypertensive heart disease with heart failure: Secondary | ICD-10-CM | POA: Diagnosis not present

## 2014-12-13 DIAGNOSIS — E119 Type 2 diabetes mellitus without complications: Secondary | ICD-10-CM | POA: Diagnosis not present

## 2014-12-13 DIAGNOSIS — I2510 Atherosclerotic heart disease of native coronary artery without angina pectoris: Secondary | ICD-10-CM | POA: Diagnosis not present

## 2014-12-13 DIAGNOSIS — J45909 Unspecified asthma, uncomplicated: Secondary | ICD-10-CM | POA: Diagnosis not present

## 2014-12-13 DIAGNOSIS — L02211 Cutaneous abscess of abdominal wall: Secondary | ICD-10-CM | POA: Diagnosis not present

## 2014-12-13 DIAGNOSIS — I5033 Acute on chronic diastolic (congestive) heart failure: Secondary | ICD-10-CM | POA: Diagnosis not present

## 2014-12-13 NOTE — Telephone Encounter (Signed)
Received call pt states she is needing an order for shower chair...Johny Chess

## 2014-12-14 NOTE — Telephone Encounter (Signed)
Done hardcopy to Dahlia  

## 2014-12-14 NOTE — Telephone Encounter (Signed)
Notified pt order ready for pick-up.../lmb 

## 2014-12-16 ENCOUNTER — Encounter: Payer: Self-pay | Admitting: Internal Medicine

## 2014-12-16 DIAGNOSIS — L02211 Cutaneous abscess of abdominal wall: Secondary | ICD-10-CM | POA: Diagnosis not present

## 2014-12-16 DIAGNOSIS — I5033 Acute on chronic diastolic (congestive) heart failure: Secondary | ICD-10-CM | POA: Diagnosis not present

## 2014-12-16 DIAGNOSIS — J45909 Unspecified asthma, uncomplicated: Secondary | ICD-10-CM | POA: Diagnosis not present

## 2014-12-16 DIAGNOSIS — I2510 Atherosclerotic heart disease of native coronary artery without angina pectoris: Secondary | ICD-10-CM | POA: Diagnosis not present

## 2014-12-16 DIAGNOSIS — E119 Type 2 diabetes mellitus without complications: Secondary | ICD-10-CM | POA: Diagnosis not present

## 2014-12-16 DIAGNOSIS — I110 Hypertensive heart disease with heart failure: Secondary | ICD-10-CM | POA: Diagnosis not present

## 2014-12-17 NOTE — Progress Notes (Signed)
Patient ID: Monica Lee, female   DOB: 08/19/1950, 64 y.o.   MRN: [id number]   Monica Lee is a 64 y.o.  female the history of 123456, HTN, diastolic CHF, hyperthyroidism, renal failure status post renal transplant, breast CA, prior TIA. She apparently had cardiac catheterization several years ago in [place] that demonstrated no CAD. She was recently seen by me  for surgical clearance for breast cancer. Lexiscan Myoview (11/05/12): High-risk scan; fixed defect affecting the entire inferolateral and anterolateral wall; mid/apical anterior and mid/apical inferior moderate ischemia, EF 28%. Cardiac MRI (10/2012): Moderate to severe LVH, EF 55%, chordal SAM but no LVOT gradient, mod circumferential effusion, mild LAE. Echocardiogram (11/06/12): Severe LVH, EF 123456, grade 1 diastolic dysfunction, moderate effusion. Tests were reviewed by Dr. Percival Spanish. Given discrepancy of results and lack of symptoms, it was felt the nuclear study was false positive and she was cleared to proceed with lumpectomy on 11/08/12.  She was admitted [id number] with a/c diastolic CHF. She presented to the ED with worsening dyspnea and an 8 pound weight gain, orthopnea and PND. She was diuresed with IV Lasix. She was seen by Dr. Haroldine Laws and the Heart Failure Team. Metolazone was added to her medical regimen.   Done with chemo Had  XRT in [facility]  04/26/14  Cr 1.35   Recent hospitalization for fluid overload and elevated Cr ? Due to Bactrim  Renal Dr Deiderding increased her  Lasix to 160 bid   Echo :  04/26/14 reviewed Study Conclusions  - Left ventricle: The cavity size was normal. Wall thickness was increased in a pattern of severe LVH. Systolic function was normal. The estimated ejection fraction was in the range of 55% to 60%. Wall motion was normal; there were no regional wall motion abnormalities. Doppler parameters are consistent with abnormal left ventricular relaxation (grade 1  diastolic dysfunction). Doppler parameters are consistent with high ventricular filling pressure. - Mitral valve: Calcified annulus. - Left atrium: The atrium was moderately dilated. - Right ventricle: The cavity size was normal. Wall thickness was increased. - Pulmonary arteries: Systolic pressure was mildly increased. PA peak pressure: 38 mm Hg (S). - Pericardium, extracardiac: A moderate pericardial effusion was identified.  Impressions:  - Normal LV function; grade 1 diastolic dysfunction with elevated LV filling pressure; moderate LAE; moderate pericardial effusion; mild TR; mildly elevated pulmonary pressure. Myocardium with speckled appearance; consider amyloid.  Although Cr normal no MRI with gad done due to renal transplant history  Echo from 12/21/14 no change in chronic effusion    ROS: Denies fever, malais, weight loss, blurry vision, decreased visual acuity, cough, sputum, SOB, hemoptysis, pleuritic pain, palpitaitons, heartburn, abdominal pain, melena, lower extremity edema, claudication, or rash.  All other systems reviewed and negative  General: Affect appropriate Obese black female  HEENT: normal Neck supple with no adenopathy JVP normal no bruits no thyromegaly Lungs clear with no wheezing and good diaphragmatic motion Heart:  S1/S2 soft SEM with no change valsalva murmur, no rub, gallop or click PMI normal Abdomen: benighn, BS positve, no tenderness, no AAA transplanted kidney lower abdomen  no bruit.  No HSM or HJR Distal pulses intact with no bruits Plus one bilateral edema Neuro non-focal Skin warm and dry No muscular weakness   Current Outpatient Prescriptions  Medication Sig Dispense Refill  . albuterol (ACCUNEB) 0.63 MG/3ML nebulizer solution Take 1 ampule by nebulization every 6 (six) hours as needed for wheezing.    Marland Kitchen albuterol (PROVENTIL HFA;VENTOLIN HFA) 108 (90 BASE) MCG/ACT  inhaler Inhale 2 puffs into the lungs every 6 (six)  hours as needed for wheezing or shortness of breath. 1 Inhaler 11  . amLODipine (NORVASC) 5 MG tablet Take 5 mg by mouth every evening.     Marland Kitchen aspirin EC 81 MG tablet Take 81 mg by mouth daily.    Marland Kitchen azaTHIOprine (IMURAN) 50 MG tablet Take 75 mg by mouth daily.     . brimonidine (ALPHAGAN) 0.2 % ophthalmic solution Place 1 drop into the right eye 2 (two) times daily.     Marland Kitchen docusate sodium (COLACE) 50 MG capsule Take 200 mg by mouth 2 (two) times daily.    . feeding supplement, GLUCERNA SHAKE, (GLUCERNA SHAKE) LIQD Take 237 mLs by mouth 2 (two) times daily between meals.  0  . furosemide (LASIX) 80 MG tablet Take 160 mg by mouth 2 (two) times daily.     . insulin NPH-regular Human (NOVOLIN 70/30) (70-30) 100 UNIT/ML injection 15-40 units inject under skin twice a day 60 mL 1  . isosorbide mononitrate (IMDUR) 60 MG 24 hr tablet Take 1 tablet (60 mg total) by mouth daily. 90 tablet 1  . KLOR-CON M20 20 MEQ tablet Take 10 mEq by mouth 2 (two) times daily. Takes 1/2 tab in morning and 1/2 at night.    . levothyroxine (SYNTHROID, LEVOTHROID) 75 MCG tablet Take 1 tablet (75 mcg total) by mouth daily. 90 tablet 3  . magnesium oxide (MAG-OX) 400 MG tablet Take 400 mg by mouth 2 (two) times daily.    . metoprolol (LOPRESSOR) 100 MG tablet TAKE 1 TABLET BY MOUTH TWICE DAILY 180 tablet 3  . minoxidil (LONITEN) 2.5 MG tablet TAKE 1 TABLET BY MOUTH TWICE DAILY 180 tablet 0  . nitroGLYCERIN (NITROSTAT) 0.4 MG SL tablet Place 0.4 mg under the tongue every 5 (five) minutes as needed for chest pain (3 doses max).    . polyethylene glycol (MIRALAX / GLYCOLAX) packet Take 17 g by mouth daily.     . pravastatin (PRAVACHOL) 20 MG tablet Take 10 mg by mouth at bedtime.      . prednisoLONE acetate (PRED FORTE) 1 % ophthalmic suspension Place 1 drop into the left eye 2 (two) times daily.     . predniSONE (DELTASONE) 10 MG tablet Take 1 tablet (10 mg total) by mouth every morning. 30 tablet 2  . tacrolimus (PROGRAF) 1 MG  capsule Take 9 mg by mouth 2 (two) times daily.     . timolol (TIMOPTIC) 0.25 % ophthalmic solution Place 1 drop into the right eye 2 (two) times daily.     No current facility-administered medications for this visit.    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  Continue current medications and low sodium Dash type diet.   DM:  Discussed low carb diet.  Target hemoglobin A1c is 6.5 or less.  Continue current medications.  Jenkins Rouge

## 2014-12-19 DIAGNOSIS — L02211 Cutaneous abscess of abdominal wall: Secondary | ICD-10-CM | POA: Diagnosis not present

## 2014-12-19 DIAGNOSIS — E119 Type 2 diabetes mellitus without complications: Secondary | ICD-10-CM | POA: Diagnosis not present

## 2014-12-19 DIAGNOSIS — I2510 Atherosclerotic heart disease of native coronary artery without angina pectoris: Secondary | ICD-10-CM | POA: Diagnosis not present

## 2014-12-19 DIAGNOSIS — I110 Hypertensive heart disease with heart failure: Secondary | ICD-10-CM | POA: Diagnosis not present

## 2014-12-19 DIAGNOSIS — I5033 Acute on chronic diastolic (congestive) heart failure: Secondary | ICD-10-CM | POA: Diagnosis not present

## 2014-12-19 DIAGNOSIS — J45909 Unspecified asthma, uncomplicated: Secondary | ICD-10-CM | POA: Diagnosis not present

## 2014-12-21 ENCOUNTER — Other Ambulatory Visit: Payer: Self-pay

## 2014-12-21 ENCOUNTER — Ambulatory Visit (HOSPITAL_COMMUNITY): Payer: Medicare Other | Attending: Cardiovascular Disease

## 2014-12-21 ENCOUNTER — Ambulatory Visit (INDEPENDENT_AMBULATORY_CARE_PROVIDER_SITE_OTHER): Payer: Medicare Other | Admitting: Cardiovascular Disease

## 2014-12-21 VITALS — BP 132/78 | HR 68 | Ht 65.0 in | Wt 191.0 lb

## 2014-12-21 DIAGNOSIS — I517 Cardiomegaly: Secondary | ICD-10-CM | POA: Insufficient documentation

## 2014-12-21 DIAGNOSIS — I10 Essential (primary) hypertension: Secondary | ICD-10-CM | POA: Diagnosis not present

## 2014-12-21 DIAGNOSIS — I5032 Chronic diastolic (congestive) heart failure: Secondary | ICD-10-CM | POA: Diagnosis not present

## 2014-12-21 DIAGNOSIS — E119 Type 2 diabetes mellitus without complications: Secondary | ICD-10-CM | POA: Insufficient documentation

## 2014-12-21 DIAGNOSIS — I313 Pericardial effusion (noninflammatory): Secondary | ICD-10-CM | POA: Insufficient documentation

## 2014-12-21 DIAGNOSIS — I2510 Atherosclerotic heart disease of native coronary artery without angina pectoris: Secondary | ICD-10-CM

## 2014-12-21 DIAGNOSIS — I5031 Acute diastolic (congestive) heart failure: Secondary | ICD-10-CM | POA: Diagnosis present

## 2014-12-21 DIAGNOSIS — E785 Hyperlipidemia, unspecified: Secondary | ICD-10-CM | POA: Diagnosis not present

## 2014-12-21 NOTE — Patient Instructions (Signed)
Medication Instructions:  Your physician recommends that you continue on your current medications as directed. Please refer to the Current Medication list given to you today.  Labwork: NONE  Testing/Procedures: Your physician has requested that you have an echocardiogram in 1 year. Echocardiography is a painless test that uses sound waves to create images of your heart. It provides your doctor with information about the size and shape of your heart and how well your heart's chambers and valves are working. This procedure takes approximately one hour. There are no restrictions for this procedure.  Follow-Up: Your physician wants you to follow-up in: 1 year with Dr. Johnsie Cancel. You will receive a reminder letter in the mail two months in advance. If you don't receive a letter, please call our office to schedule the follow-up appointment.   Any Other Special Instructions Will Be Listed Below (If Applicable).     If you need a refill on your cardiac medications before your next appointment, please call your pharmacy.

## 2014-12-23 ENCOUNTER — Telehealth: Payer: Self-pay | Admitting: Internal Medicine

## 2014-12-23 DIAGNOSIS — L02211 Cutaneous abscess of abdominal wall: Secondary | ICD-10-CM | POA: Diagnosis not present

## 2014-12-23 DIAGNOSIS — E119 Type 2 diabetes mellitus without complications: Secondary | ICD-10-CM | POA: Diagnosis not present

## 2014-12-23 DIAGNOSIS — I110 Hypertensive heart disease with heart failure: Secondary | ICD-10-CM | POA: Diagnosis not present

## 2014-12-23 DIAGNOSIS — I5033 Acute on chronic diastolic (congestive) heart failure: Secondary | ICD-10-CM | POA: Diagnosis not present

## 2014-12-23 NOTE — Telephone Encounter (Signed)
Monica Lee advised that pt does not require an OV prior to discharge from services

## 2014-12-23 NOTE — Telephone Encounter (Signed)
Monica Lee from [facility] is requesting a call back regarding pts wound care. The wound has healed and was wondering if she needs to be seen before they discharge her from their care. She can be reached at [phone]

## 2014-12-26 DIAGNOSIS — I5033 Acute on chronic diastolic (congestive) heart failure: Secondary | ICD-10-CM | POA: Diagnosis not present

## 2014-12-26 DIAGNOSIS — E119 Type 2 diabetes mellitus without complications: Secondary | ICD-10-CM | POA: Diagnosis not present

## 2014-12-26 DIAGNOSIS — I2510 Atherosclerotic heart disease of native coronary artery without angina pectoris: Secondary | ICD-10-CM | POA: Diagnosis not present

## 2014-12-26 DIAGNOSIS — L02211 Cutaneous abscess of abdominal wall: Secondary | ICD-10-CM | POA: Diagnosis not present

## 2014-12-26 DIAGNOSIS — I110 Hypertensive heart disease with heart failure: Secondary | ICD-10-CM | POA: Diagnosis not present

## 2014-12-26 DIAGNOSIS — J45909 Unspecified asthma, uncomplicated: Secondary | ICD-10-CM | POA: Diagnosis not present

## 2014-12-27 ENCOUNTER — Ambulatory Visit
Admission: RE | Admit: 2014-12-27 | Discharge: 2014-12-27 | Disposition: A | Discharge status: Home / Self Care | Payer: Medicare Other | Source: Ambulatory Visit | Attending: Internal Medicine | Admitting: Internal Medicine

## 2014-12-27 DIAGNOSIS — M109 Gout, unspecified: Secondary | ICD-10-CM | POA: Diagnosis not present

## 2014-12-27 DIAGNOSIS — E8770 Fluid overload, unspecified: Secondary | ICD-10-CM | POA: Diagnosis not present

## 2014-12-27 DIAGNOSIS — I25119 Atherosclerotic heart disease of native coronary artery with unspecified angina pectoris: Secondary | ICD-10-CM | POA: Diagnosis not present

## 2014-12-27 DIAGNOSIS — N2581 Secondary hyperparathyroidism of renal origin: Secondary | ICD-10-CM | POA: Diagnosis not present

## 2014-12-27 DIAGNOSIS — N183 Chronic kidney disease, stage 3 (moderate): Secondary | ICD-10-CM | POA: Diagnosis not present

## 2014-12-27 DIAGNOSIS — Z940 Kidney transplant status: Secondary | ICD-10-CM | POA: Diagnosis not present

## 2014-12-27 DIAGNOSIS — R922 Inconclusive mammogram: Secondary | ICD-10-CM | POA: Diagnosis not present

## 2014-12-27 DIAGNOSIS — E782 Mixed hyperlipidemia: Secondary | ICD-10-CM | POA: Diagnosis not present

## 2014-12-27 DIAGNOSIS — Z853 Personal history of malignant neoplasm of breast: Secondary | ICD-10-CM

## 2014-12-27 DIAGNOSIS — I129 Hypertensive chronic kidney disease with stage 1 through stage 4 chronic kidney disease, or unspecified chronic kidney disease: Secondary | ICD-10-CM | POA: Diagnosis not present

## 2014-12-27 DIAGNOSIS — E118 Type 2 diabetes mellitus with unspecified complications: Secondary | ICD-10-CM | POA: Diagnosis not present

## 2015-01-03 DIAGNOSIS — N2581 Secondary hyperparathyroidism of renal origin: Secondary | ICD-10-CM | POA: Diagnosis not present

## 2015-01-03 DIAGNOSIS — Z940 Kidney transplant status: Secondary | ICD-10-CM | POA: Diagnosis not present

## 2015-01-03 NOTE — Telephone Encounter (Signed)
Marcie Bal from [facility] called regarding this prescription for the shower chair.  Can you please call her at [phone]

## 2015-01-03 NOTE — Telephone Encounter (Signed)
Called Marcie Bal she stated received call from lady name Brandy requesting OV notes on pt concerning why she need shower chair. Inform Marcie Bal will contact [place] @ [phone]. Called Brandy w/[facility] she states in the state of [place] pt must have a face to face visit before insurance will cover supplies. Inform her pt hasn't had an appt she just call to request chair. Will call pt to get her set-up for appt. Called pt no answer LMOM RTC...Johny Chess

## 2015-01-04 NOTE — Telephone Encounter (Signed)
Notified pt concerning msg below. MADE appt for next Wed 12/28 @ 2:15pm.../lmb

## 2015-01-11 ENCOUNTER — Encounter: Payer: Self-pay | Admitting: Internal Medicine

## 2015-01-11 ENCOUNTER — Ambulatory Visit (INDEPENDENT_AMBULATORY_CARE_PROVIDER_SITE_OTHER): Payer: Medicare Other | Admitting: Internal Medicine

## 2015-01-11 VITALS — BP 112/66 | HR 70 | Temp 98.7°F | Ht 65.0 in | Wt 191.0 lb

## 2015-01-11 DIAGNOSIS — I509 Heart failure, unspecified: Secondary | ICD-10-CM

## 2015-01-11 DIAGNOSIS — I10 Essential (primary) hypertension: Secondary | ICD-10-CM

## 2015-01-11 DIAGNOSIS — R269 Unspecified abnormalities of gait and mobility: Secondary | ICD-10-CM

## 2015-01-11 DIAGNOSIS — I2510 Atherosclerotic heart disease of native coronary artery without angina pectoris: Secondary | ICD-10-CM

## 2015-01-11 NOTE — Assessment & Plan Note (Signed)
With balance issues, ok for shower chair for this,  to f/u any worsening symptoms or concerns

## 2015-01-11 NOTE — Progress Notes (Signed)
Pre visit review using our clinic review tool, if applicable. No additional management support is needed unless otherwise documented below in the visit note. 

## 2015-01-11 NOTE — Progress Notes (Signed)
Subjective:    Patient ID: Monica Lee, female    DOB: 10/02/1950, 64 y.o.   MRN: [id number]  HPI  Here to f/u, has hx of mult med problems, including left eye blind, heart disease, TIA, and renal transplant, DM for face to face exam for need for shower chair, still walking with cane for balance issues, s/p severe fall to left hip area/left leg 2015 required surgury x 2 for hematoma, with subsequent skin graft from the right leg, wound vac and PT.  Has had 2 minor falls since then without injury.  Has tub at home, needs chair for balance and support with showering, no walkin showers avaiable.  Pt denies chest pain, increased sob or doe, wheezing, orthopnea, PND, increased LE swelling, palpitations, dizziness or syncope.  Pt denies new neurological symptoms such as new headache, or facial or extremity weakness or numbness   Pt denies polydipsia, polyuria  Had recent LLQ abd wall abscess now healed Past Medical History  Diagnosis Date  . S/P kidney transplant     CADAVERIC --  05/2006  . Hyperlipidemia   . DJD (degenerative joint disease)   . Hx of colonic polyps   . Diabetic retinopathy     RIGHT EYE  . Hypertension   . Asthma   . Blind left eye     OCCLUDED CORNEA,  FUNDI--  FAILED SURGERY REPAIR 2003  . Chronic diastolic CHF (congestive heart failure) ([place])     a. Cardiac MRI (10/2012): Moderate to severe LVH, EF 55%, chordal SAM but no LVOT gradient, mod circumferential effusion, mild LAE.  b.  Echocardiogram (11/06/12): Severe LVH, EF 123456, grade 1 diastolic dysfunction, moderate effusion.  . Wears glasses   . History of myocardial infarction     2011--  S/P CARDIAC CATH ([place])  NON-OBSTRUCTIVE CAD  . GERD (gastroesophageal reflux disease)   . History of kidney stones   . History of CVA (cerebrovascular accident) without residual deficits     2008  AND TIA IN 2008  W/ NO RESIDUAL  . History of peptic ulcer     2011--  RESOLVED  . 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 Breast cancer ([place]) DX  OCT 2014  ---  STAGE IIA  DCIS (T2  N0)    11-09-2012  RIGHT BREAST LUMPECTOMY W/ SLN DISSECTION---  CHEMOTHERAPY  . Gastroparesis due to DM ([place])   . Chronic kidney disease (CKD), stage III (moderate)     NEPHROLOGIST--  DR Freddrick March  . Open thigh wound     ANTERIOR  . Coronary artery disease CARDIOLOGIST--  DR Johnsie Cancel    NON-OBSTRUCTIVE CAD  PER CARDIAC CATH  2011  . Pericardial effusion without cardiac tamponade     PER DR NISHAN  NOTE --  NOT MALIGNANT  ? RELATED TO HYPOTHYROIDISM  . Thyromegaly     MULTINODULER GOITER  . Hyperparathyroidism, secondary renal ([place])   . Anemia in chronic kidney disease   . Trigeminal neuralgia   . Hypothyroidism   . At risk for sleep apnea     STOP-BANG= 4   SENT TO PCP  03-25-2013   Past Surgical History  Procedure Laterality Date  . Transplantation renal  05/2006    CADAVERIC  . Breast biopsy  2010  . Cholecystectomy  2008  . Hemorrhoid surgery  01/23/2012    Procedure: HEMORRHOIDECTOMY PROLAPSED;  Surgeon: Leighton Ruff, MD;  Location: [facility];  Service: General;  Laterality:  N/A;  . Foot surgery Right 2013  . Breast lumpectomy with sentinel lymph node biopsy Right 11/09/2012    Procedure: RIGHT BREAST LUMPECTOMY WITH SENTINEL LYMPH NODE REMOVAL;  Surgeon: Haywood Lasso, MD;  Location: [facility];  Service: General;  Laterality: Right;  . Portacath placement Right 11/30/2012    Procedure: INSERTION PORT-A-CATH;  Surgeon: Haywood Lasso, MD;  Location: [place];  Service: General;  Laterality: Right;  . Irrigation and debridement abscess Left 02/14/2013    Procedure: IRRIGATION AND DEBRIDEMENT LEFT THIGH ABSCESS;  Surgeon: Joyice Faster. Cornett, MD;  Location: [place];  Service: General;  Laterality: Left;  . Incision and drainage of wound Left 02/18/2013    Procedure: IRRIGATION AND DEBRIDEMENT THIGH WOUND;  Surgeon: Rolm Bookbinder, MD;  Location: [place];  Service:  General;  Laterality: Left;  . Cataract extraction w/ intraocular lens  implant, bilateral    . Repair  intestinal perforation surgery  01-31-2009  . Cardiac catheterization  04-12-2009  DR EVELYNE GOUDREAU ([facility] IN [place], [place])    NON-OBSTRUCTIVE CAD/  mLAD 40%,  oLAD 50%,  dCFX 40%,  OM1  40%,  mRCA  &  dRCA  50%/  LVEF 65% /  ELEVATED  LVEDP  . Transthoracic echocardiogram  01-19-2013  DR NISHAN    SEVERE LVH/  EF XX123456  GRADE I DIASTOLIC DYSFUNCTION/  MODERATE LAE/  MILD IMPROVEMENT OF MODERATE CIRCUMFERENTIAL INFERIOROLATERAL PERICARDIAL EFFUSION WITH NO TAMPONADE  . Cardiovascular stress test  11-04-2012  DR NISHAN    HIGH RISK NUCLEAR STUDY/  FIXED DEFECT AFFECTING ENTIRE INFEROLATERAL AND ANTEROLATERAL WALL/  MODERATE ISCHEMIA  AT MID/ APICAL ANTEROR AND INFERIOR WALL/  GLOBAL HYPOKINESIS/  LVEF 28% (FELT TO BE FALSE +,  ECHO & cMRI  NORMAL EF AND NORMAL WM)  . Vaginal hysterectomy  1990's    W/  BILATERAL SALPINGOOPHORECTOMY  . Eye surgery Left 2003    REPAIR CORNEA  . Skin split graft Left 03/29/2013    Procedure: SKIN GRAFT SPLIT THICKNESS WITH A-CELL AND VAC TO LEFT THIGH;  Surgeon: Theodoro Kos, DO;  Location: [place];  Service: Plastics;  Laterality: Left;    reports that she has never smoked. She has never used smokeless tobacco. She reports that she does not drink alcohol or use illicit drugs. family history includes Arthritis in her other; Diabetes in her other and other; Heart disease in her father and other; Hypertension in her other; Thyroid disease in her other. There is no history of Kidney disease. Allergies  Allergen Reactions  . 2,4-D Dimethylamine (Amisol) Hives  . Keflex [Cephalexin] Swelling    Swelling to face, chills Tolerates zosyn 02/14/13  . Propoxyphene N-Acetaminophen Hives and Itching    Patient can tolerate acetaminophen solely  . Vancomycin Itching    Itching all over when vancomycin given 09/12/14   Current Outpatient  Prescriptions on File Prior to Visit  Medication Sig Dispense Refill  . albuterol (ACCUNEB) 0.63 MG/3ML nebulizer solution Take 1 ampule by nebulization every 6 (six) hours as needed for wheezing.    Marland Kitchen albuterol (PROVENTIL HFA;VENTOLIN HFA) 108 (90 BASE) MCG/ACT inhaler Inhale 2 puffs into the lungs every 6 (six) hours as needed for wheezing or shortness of breath. 1 Inhaler 11  . amLODipine (NORVASC) 5 MG tablet Take 5 mg by mouth every evening.     Marland Kitchen aspirin EC 81 MG tablet Take 81 mg by mouth daily.    Marland Kitchen azaTHIOprine (IMURAN) 50 MG tablet Take 75  mg by mouth daily.     . brimonidine (ALPHAGAN) 0.2 % ophthalmic solution Place 1 drop into the right eye 2 (two) times daily.     Marland Kitchen docusate sodium (COLACE) 50 MG capsule Take 200 mg by mouth 2 (two) times daily.    . feeding supplement, GLUCERNA SHAKE, (GLUCERNA SHAKE) LIQD Take 237 mLs by mouth 2 (two) times daily between meals.  0  . furosemide (LASIX) 80 MG tablet Take 160 mg by mouth 2 (two) times daily.     . insulin NPH-regular Human (NOVOLIN 70/30) (70-30) 100 UNIT/ML injection 15-40 units inject under skin twice a day 60 mL 1  . isosorbide mononitrate (IMDUR) 60 MG 24 hr tablet Take 1 tablet (60 mg total) by mouth daily. 90 tablet 1  . KLOR-CON M20 20 MEQ tablet Take 10 mEq by mouth 2 (two) times daily. Takes 1/2 tab in morning and 1/2 at night.    . levothyroxine (SYNTHROID, LEVOTHROID) 75 MCG tablet Take 1 tablet (75 mcg total) by mouth daily. 90 tablet 3  . magnesium oxide (MAG-OX) 400 MG tablet Take 400 mg by mouth 2 (two) times daily.    . metoprolol (LOPRESSOR) 100 MG tablet TAKE 1 TABLET BY MOUTH TWICE DAILY 180 tablet 3  . minoxidil (LONITEN) 2.5 MG tablet TAKE 1 TABLET BY MOUTH TWICE DAILY 180 tablet 0  . nitroGLYCERIN (NITROSTAT) 0.4 MG SL tablet Place 0.4 mg under the tongue every 5 (five) minutes as needed for chest pain (3 doses max).    . polyethylene glycol (MIRALAX / GLYCOLAX) packet Take 17 g by mouth daily.     .  pravastatin (PRAVACHOL) 20 MG tablet Take 10 mg by mouth at bedtime.      . prednisoLONE acetate (PRED FORTE) 1 % ophthalmic suspension Place 1 drop into the left eye 2 (two) times daily.     . predniSONE (DELTASONE) 10 MG tablet Take 1 tablet (10 mg total) by mouth every morning. 30 tablet 2  . tacrolimus (PROGRAF) 1 MG capsule Take 9 mg by mouth 2 (two) times daily.     . timolol (TIMOPTIC) 0.25 % ophthalmic solution Place 1 drop into the right eye 2 (two) times daily.     No current facility-administered medications on file prior to visit.     Review of Systems  Constitutional: Negative for unusual diaphoresis or night sweats HENT: Negative for ringing in ear or discharge Eyes: Negative for double vision or worsening visual disturbance.  Respiratory: Negative for choking and stridor.   Gastrointestinal: Negative for vomiting or other signifcant bowel change Genitourinary: Negative for hematuria or change in urine volume.  Musculoskeletal: Negative for other MSK pain or swelling Skin: Negative for color change and worsening wound.  Neurological: Negative for tremors and numbness other than noted  Psychiatric/Behavioral: Negative for decreased concentration or agitation other than above       Objective:   Physical Exam BP 112/66 mmHg  Pulse 70  Temp(Src) 98.7 F (37.1 C) (Oral)  Ht 5\' 5"  (1.651 m)  Wt 191 lb (86.637 kg)  BMI 31.78 kg/m2  SpO2 97% VS noted,  Constitutional: Pt appears in no significant distress HENT: Head: NCAT.  Right Ear: External ear normal.  Left Ear: External ear normal.  Eyes: . Pupils are equal, round, and reactive to light. Conjunctivae and EOM are normal Neck: Normal range of motion. Neck supple.  Cardiovascular: Normal rate and regular rhythm.   Pulmonary/Chest: Effort normal and breath sounds without rales or wheezing.  Abd:  Soft, NT, ND, + BS Neurological: Pt is alert. Not confused , motor grossly intact, requires cane for balance with  walking Skin: Skin is warm. No rash, no LE edema Psychiatric: Pt behavior is normal. No agitation.      Assessment & Plan:

## 2015-01-11 NOTE — Assessment & Plan Note (Signed)
stable overall by history and exam, recent data reviewed with pt, and pt to continue medical treatment as before,  to f/u any worsening symptoms or concerns BP Readings from Last 3 Encounters:  01/11/15 112/66  12/21/14 132/78  11/28/14 122/78

## 2015-01-11 NOTE — Patient Instructions (Signed)
Please continue all other medications as before, and refills have been done if requested.  Please have the pharmacy call with any other refills you may need.  Please continue your efforts at being more active, low cholesterol diet, and weight control.  You are otherwise up to date with prevention measures today.  Please keep your appointments with your specialists as you may have planned  We can approve the shower chair based on exam today  Please return in 6 months, or sooner if needed

## 2015-01-11 NOTE — Assessment & Plan Note (Signed)
Compensated,  to f/u any worsening symptoms or concerns  

## 2015-01-18 ENCOUNTER — Telehealth: Payer: Self-pay | Admitting: Internal Medicine

## 2015-01-18 DIAGNOSIS — R269 Unspecified abnormalities of gait and mobility: Secondary | ICD-10-CM

## 2015-01-18 NOTE — Telephone Encounter (Signed)
Per last visit notes, please fax orders for shower seat to the following... [place], [place] fax# [phone]

## 2015-01-19 NOTE — Telephone Encounter (Signed)
Previous fax number incorrect. Order faxed to [phone]

## 2015-01-19 NOTE — Telephone Encounter (Signed)
Order printed, signed and faxed

## 2015-01-23 ENCOUNTER — Ambulatory Visit: Payer: Medicare Other | Admitting: Internal Medicine

## 2015-01-30 ENCOUNTER — Other Ambulatory Visit: Payer: Self-pay | Admitting: Internal Medicine

## 2015-01-30 DIAGNOSIS — Z940 Kidney transplant status: Secondary | ICD-10-CM | POA: Diagnosis not present

## 2015-02-24 ENCOUNTER — Other Ambulatory Visit: Payer: Self-pay | Admitting: Cardiovascular Disease

## 2015-02-24 ENCOUNTER — Other Ambulatory Visit: Payer: Self-pay | Admitting: Internal Medicine

## 2015-03-08 ENCOUNTER — Other Ambulatory Visit: Payer: Self-pay

## 2015-03-08 DIAGNOSIS — C50511 Malignant neoplasm of lower-outer quadrant of right female breast: Secondary | ICD-10-CM

## 2015-03-09 ENCOUNTER — Other Ambulatory Visit (HOSPITAL_BASED_OUTPATIENT_CLINIC_OR_DEPARTMENT_OTHER): Payer: Medicare Other

## 2015-03-09 ENCOUNTER — Telehealth: Payer: Self-pay | Admitting: Oncology

## 2015-03-09 ENCOUNTER — Ambulatory Visit (HOSPITAL_BASED_OUTPATIENT_CLINIC_OR_DEPARTMENT_OTHER): Payer: Medicare Other | Admitting: Oncology

## 2015-03-09 VITALS — BP 130/62 | HR 65 | Temp 97.5°F | Resp 18 | Ht 65.0 in | Wt 195.2 lb

## 2015-03-09 DIAGNOSIS — C50511 Malignant neoplasm of lower-outer quadrant of right female breast: Secondary | ICD-10-CM

## 2015-03-09 DIAGNOSIS — Z171 Estrogen receptor negative status [ER-]: Secondary | ICD-10-CM

## 2015-03-09 DIAGNOSIS — E139 Other specified diabetes mellitus without complications: Secondary | ICD-10-CM | POA: Insufficient documentation

## 2015-03-09 DIAGNOSIS — Z940 Kidney transplant status: Secondary | ICD-10-CM | POA: Insufficient documentation

## 2015-03-09 DIAGNOSIS — R222 Localized swelling, mass and lump, trunk: Secondary | ICD-10-CM

## 2015-03-09 LAB — CBC WITH DIFFERENTIAL/PLATELET
BASO%: 1 % (ref 0.0–2.0)
Basophils Absolute: 0.1 10*3/uL (ref 0.0–0.1)
EOS ABS: 0.1 10*3/uL (ref 0.0–0.5)
EOS%: 1.2 % (ref 0.0–7.0)
HEMATOCRIT: 42.9 % (ref 34.8–46.6)
HEMOGLOBIN: 13.6 g/dL (ref 11.6–15.9)
LYMPH%: 13.7 % — AB (ref 14.0–49.7)
MCH: 27.6 pg (ref 25.1–34.0)
MCHC: 31.7 g/dL (ref 31.5–36.0)
MCV: 87.2 fL (ref 79.5–101.0)
MONO#: 0.5 10*3/uL (ref 0.1–0.9)
MONO%: 8 % (ref 0.0–14.0)
NEUT%: 76.1 % (ref 38.4–76.8)
NEUTROS ABS: 4.9 10*3/uL (ref 1.5–6.5)
PLATELETS: 213 10*3/uL (ref 145–400)
RBC: 4.92 10*6/uL (ref 3.70–5.45)
RDW: 16.8 % — ABNORMAL HIGH (ref 11.2–14.5)
WBC: 6.5 10*3/uL (ref 3.9–10.3)
lymph#: 0.9 10*3/uL (ref 0.9–3.3)

## 2015-03-09 LAB — COMPREHENSIVE METABOLIC PANEL
ALBUMIN: 3.8 g/dL (ref 3.5–5.0)
ALK PHOS: 46 U/L (ref 40–150)
ALT: 18 U/L (ref 0–55)
ANION GAP: 8 meq/L (ref 3–11)
AST: 24 U/L (ref 5–34)
BILIRUBIN TOTAL: 0.39 mg/dL (ref 0.20–1.20)
BUN: 20.3 mg/dL (ref 7.0–26.0)
CALCIUM: 9.3 mg/dL (ref 8.4–10.4)
CO2: 31 mEq/L — ABNORMAL HIGH (ref 22–29)
Chloride: 104 mEq/L (ref 98–109)
Creatinine: 1 mg/dL (ref 0.6–1.1)
EGFR: 71 mL/min/{1.73_m2} — AB (ref >90)
Glucose: 78 mg/dl (ref 70–140)
Potassium: 4.2 mEq/L (ref 3.5–5.1)
Sodium: 143 mEq/L (ref 136–145)
TOTAL PROTEIN: 6.9 g/dL (ref 6.4–8.3)

## 2015-03-09 NOTE — Telephone Encounter (Signed)
Gave patient avs report and appointments for Februray 2018.

## 2015-03-09 NOTE — Progress Notes (Signed)
Monica Lee  [phone] Fax:(336) [phone]     ID: Monica Lee OB: 1951/01/09  MR#: [id number]  FKC#:[id number]  PCP: Monica Cower, MD GYN:   Monica Lee] OTHER MD: Monica Lee, Monica Lee, Monica Lee  CHIEF COMPLAINT: Triple negative breast cancer  CURRENT TREATMENT: Observation   BREAST CANCER HISTORY: From the original intake note:  The patient herself palpated a mass in her right breast. She brought it to medical attention at the time of her routinely scheduled mammogram 10/08/2012. This showed an irregular mass in the right breast lower outer quadrant which by ultrasound measured 2.1 cm. Biopsy of the mass was performed the same day, and showed ([id number]) invasive ductal carcinoma, grade 3, triple negative, with an MIB-1 of 56%. She proceeded to right lumpectomy and sentinel lymph node sampling 11/09/2012 with the pathology(SZA 14-04/07/2001 showing invasive ductal carcinoma, grade 3, measuring 2.2 cm, with negative margins, and all 3 sentinel lymph nodes benign. Repeat prognostic panel was again triple negative. The HER-2 signals ratio was 1.38 and the number per cell 2.2.  Her subsequent history is as detailed below  INTERVAL HISTORY: Monica Lee returns today for follow-up of her right-sided breast cancer. The interval history is unremarkable.Asked what her worst problem is she says it's all the medications she has to take-- including immuran, prograf and steroids for her renal transplant. She is not excercising regularly.  REVIEW OF SYSTEMS: She swells sometimes--like this morning, she says--and then takes an extra lasix. She tells me her sugars are well controlled. She is somewhat deonditioned and can feel short of breath with moderate activity. She has arthritis pains here and there which are not more intense or persistent than before. She has occasional hot flashes. A detailed review of systems is otherwise  stable   PAST MEDICAL HISTORY: Past Medical History  Diagnosis Date  . S/P kidney transplant     CADAVERIC --  05/2006  . Hyperlipidemia   . DJD (degenerative joint disease)   . Hx of colonic polyps   . Diabetic retinopathy     RIGHT EYE  . Hypertension   . Asthma   . Blind left eye     OCCLUDED CORNEA,  FUNDI--  FAILED SURGERY REPAIR 2003  . Chronic diastolic CHF (congestive heart failure) ([place])     a. Cardiac MRI (10/2012): Moderate to severe LVH, EF 55%, chordal SAM but no LVOT gradient, mod circumferential effusion, mild LAE.  b.  Echocardiogram (11/06/12): Severe LVH, EF 75-91%, grade 1 diastolic dysfunction, moderate effusion.  . Wears glasses   . History of myocardial infarction     2011--  S/P CARDIAC CATH ([place])  NON-OBSTRUCTIVE CAD  . GERD (gastroesophageal reflux disease)   . History of kidney stones   . History of CVA (cerebrovascular accident) without residual deficits     2008  AND TIA IN 2008  W/ NO RESIDUAL  . History of peptic ulcer     2011--  RESOLVED  . 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 Breast cancer ([place]) DX  OCT 2014  ---  STAGE IIA  DCIS (T2  N0)    11-09-2012  RIGHT BREAST LUMPECTOMY W/ SLN DISSECTION---  CHEMOTHERAPY  . Gastroparesis due to DM ([place])   . Chronic kidney disease (CKD), stage III (moderate)     NEPHROLOGIST--  Monica Lee  . Open thigh wound     ANTERIOR  . Coronary artery disease CARDIOLOGIST--  Monica Lee  NON-OBSTRUCTIVE CAD  PER CARDIAC CATH  2011  . Pericardial effusion without cardiac tamponade     PER Monica Lee  NOTE --  NOT MALIGNANT  ? RELATED TO HYPOTHYROIDISM  . Thyromegaly     MULTINODULER GOITER  . Hyperparathyroidism, secondary renal ([place])   . Anemia in chronic kidney disease   . Trigeminal neuralgia   . Hypothyroidism   . At risk for sleep apnea     STOP-BANG= 4   SENT TO PCP  03-25-2013    PAST SURGICAL HISTORY: Past Surgical History  Procedure Laterality Date  .  Transplantation renal  05/2006    CADAVERIC  . Breast biopsy  2010  . Cholecystectomy  2008  . Hemorrhoid surgery  01/23/2012    Procedure: HEMORRHOIDECTOMY PROLAPSED;  Surgeon: Monica Ruff, MD;  [facility];  Service: General;  Laterality: N/A;  . Foot surgery Right 2013  . Breast lumpectomy with sentinel lymph node biopsy Right 11/09/2012    Procedure: RIGHT BREAST LUMPECTOMY WITH SENTINEL LYMPH NODE REMOVAL;  Surgeon: Monica Lasso, MD;  Location: [place];  Service: General;  Laterality: Right;  . Portacath placement Right 11/30/2012    Procedure: INSERTION PORT-A-CATH;  Surgeon: Monica Lasso, MD;  Location: [place];  Service: General;  Laterality: Right;  . Irrigation and debridement abscess Left 02/14/2013    Procedure: IRRIGATION AND DEBRIDEMENT LEFT THIGH ABSCESS;  Surgeon: Monica Faster. Cornett, MD;  Location: [place];  Service: General;  Laterality: Left;  . Incision and drainage of wound Left 02/18/2013    Procedure: IRRIGATION AND DEBRIDEMENT THIGH WOUND;  Surgeon: Monica Bookbinder, MD;  Location: [place];  Service: General;  Laterality: Left;  . Cataract extraction w/ intraocular lens  implant, bilateral    . Repair  intestinal perforation surgery  01-31-2009  . Cardiac catheterization  04-12-2009  Monica Monica Lee ([facility] IN [place], [place])    NON-OBSTRUCTIVE CAD/  mLAD 40%,  oLAD 50%,  dCFX 40%,  OM1  40%,  mRCA  &  dRCA  50%/  LVEF 65% /  ELEVATED  LVEDP  . Transthoracic echocardiogram  01-19-2013  Monica Lee    SEVERE LVH/  EF 76-81%/  GRADE I DIASTOLIC DYSFUNCTION/  MODERATE LAE/  MILD IMPROVEMENT OF MODERATE CIRCUMFERENTIAL INFERIOROLATERAL PERICARDIAL EFFUSION WITH NO TAMPONADE  . Cardiovascular stress test  11-04-2012  Monica Lee    HIGH RISK NUCLEAR STUDY/  FIXED DEFECT AFFECTING ENTIRE INFEROLATERAL AND ANTEROLATERAL WALL/  MODERATE ISCHEMIA  AT MID/ APICAL ANTEROR AND INFERIOR WALL/  GLOBAL HYPOKINESIS/  LVEF 28% (FELT TO BE FALSE +,   ECHO & cMRI  NORMAL EF AND NORMAL WM)  . Vaginal hysterectomy  1990's    W/  BILATERAL SALPINGOOPHORECTOMY  . Eye surgery Left 2003    REPAIR CORNEA  . Skin split graft Left 03/29/2013    Procedure: SKIN GRAFT SPLIT THICKNESS WITH A-CELL AND VAC TO LEFT THIGH;  Surgeon: Monica Kos, DO;  Location: [place];  Service: Plastics;  Laterality: Left;    FAMILY HISTORY Family History  Problem Relation Age of Onset  . Kidney disease Neg Hx   . Arthritis Other     parent  . Heart disease Other     parent  . Hypertension Other     parent  . Diabetes Other     parent  . Diabetes Other     grandparent  . Thyroid disease Other     several  . Heart disease  Father   The patient's father died at the age of 60 from a myocardial infarction. The patient's mother died in childbirth. The patient had 2 brothers, one sister. There is no history of breast or ovarian cancer in the family to her knowledge.  GYNECOLOGIC HISTORY:  Menarche age 64, first live birth age 83. The patient is GX P4. Gender 1 hysterectomy without salpingo-oophorectomy in the early 1990s. She did not take hormone replacement.   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 Her husband Wille Glaser used to work in Architect. He is also retired. It's just the 2 of them at home. She has 4 grandchildren. She attends a local [employer]: Not in place   HEALTH MAINTENANCE: Social History  Substance Use Topics  . Smoking status: Never Smoker   . Smokeless tobacco: Never Used  . Alcohol Use: No    Mammo: 09/2012  Colonoscopy:  2013, Monica. Donna Christen, unsure of f/u recommendation  Bone density: unsure  Lipid panel: 2015  Allergies  Allergen Reactions  . 2,4-D Dimethylamine (Amisol) Hives  . Keflex [Cephalexin] Swelling    Swelling to face, chills Tolerates zosyn 02/14/13  . Propoxyphene N-Acetaminophen Hives and Itching    Patient can tolerate acetaminophen solely  .  Vancomycin Itching    Itching all over when vancomycin given 09/12/14    Current Outpatient Prescriptions  Medication Sig Dispense Refill  . albuterol (ACCUNEB) 0.63 MG/3ML nebulizer solution Take 1 ampule by nebulization every 6 (six) hours as needed for wheezing.    Marland Kitchen albuterol (PROVENTIL HFA;VENTOLIN HFA) 108 (90 BASE) MCG/ACT inhaler Inhale 2 puffs into the lungs every 6 (six) hours as needed for wheezing or shortness of breath. 1 Inhaler 11  . amLODipine (NORVASC) 5 MG tablet Take 5 mg by mouth every evening.     Marland Kitchen aspirin EC 81 MG tablet Take 81 mg by mouth daily.    Marland Kitchen azaTHIOprine (IMURAN) 50 MG tablet Take 75 mg by mouth daily.     . brimonidine (ALPHAGAN) 0.2 % ophthalmic solution Place 1 drop into the right eye 2 (two) times daily.     . calcitRIOL (ROCALTROL) 0.25 MCG capsule TK 1 C PO D  6  . docusate sodium (COLACE) 50 MG capsule Take 200 mg by mouth 2 (two) times daily.    . feeding supplement, GLUCERNA SHAKE, (GLUCERNA SHAKE) LIQD Take 237 mLs by mouth 2 (two) times daily between meals.  0  . furosemide (LASIX) 80 MG tablet Take 160 mg by mouth 2 (two) times daily.     . insulin NPH-regular Human (NOVOLIN 70/30) (70-30) 100 UNIT/ML injection 15-40 units inject under skin twice a day 60 mL 1  . isosorbide mononitrate (IMDUR) 60 MG 24 hr tablet Take 1 tablet (60 mg total) by mouth daily. 90 tablet 1  . KLOR-CON M20 20 MEQ tablet Take 10 mEq by mouth 2 (two) times daily. Takes 1/2 tab in morning and 1/2 at night.    . levothyroxine (SYNTHROID, LEVOTHROID) 75 MCG tablet Take 1 tablet (75 mcg total) by mouth daily. 90 tablet 3  . magnesium oxide (MAG-OX) 400 MG tablet Take 400 mg by mouth 2 (two) times daily.    . metoprolol (LOPRESSOR) 100 MG tablet TAKE 1 TABLET BY MOUTH TWICE DAILY 180 tablet 3  . minoxidil (LONITEN) 2.5 MG tablet TAKE 1 TABLET BY MOUTH TWICE DAILY 180 tablet 0  . nitroGLYCERIN (NITROSTAT) 0.4 MG SL tablet Place 0.4  mg under the tongue every 5 (five) minutes as  needed for chest pain (3 doses max).    . nitroGLYCERIN (NITROSTAT) 0.4 MG SL tablet Place 1 tablet (0.4 mg total) under the tongue every 5 (five) minutes as needed for chest pain. 25 tablet 5  . polyethylene glycol (MIRALAX / GLYCOLAX) packet Take 17 g by mouth daily.     . polyethylene glycol powder (GLYCOLAX/MIRALAX) powder MIX 17GM AS DIRECTED DAILY 527 g 0  . pravastatin (PRAVACHOL) 20 MG tablet Take 10 mg by mouth at bedtime.      . prednisoLONE acetate (PRED FORTE) 1 % ophthalmic suspension Place 1 drop into the left eye 2 (two) times daily.     . predniSONE (DELTASONE) 10 MG tablet TAKE 1 TABLET(10 MG) BY MOUTH EVERY MORNING 30 tablet 0  . tacrolimus (PROGRAF) 1 MG capsule Take 9 mg by mouth 2 (two) times daily.     . timolol (TIMOPTIC) 0.25 % ophthalmic solution Place 1 drop into the right eye 2 (two) times daily.     No current facility-administered medications for this visit.    OBJECTIVE: Middle-aged [place] American woman in no acute distress  Filed Vitals:   03/09/15 1109  BP: 130/62  Pulse: 65  Temp: 97.5 F (36.4 C)  Resp: 18     Body mass index is 32.48 kg/(m^2).      ECOG FS:1 - Symptomatic but completely ambulatory  Sclerae unicteric, EOMs intact Oropharynx clear, slightly dry No cervical or supraclavicular adenopathy Lungs no rales or rhonchi Heart regular rate and rhythm Abd soft, obese, nontender, positive bowel sounds MSK no focal spinal tenderness. Neuro: nonfocal, well oriented, appropriate affect Breasts: the right breast is s/p lumpectomy and radiation. There is no evidence of local recurrence. The right axilla is benign--note there is a 1.5 cm area of induration immediately below the axillary incision, c/w scar tissue. The left breast is unremarkable   LAB RESULTS:  CMP     Component Value Date/Time   NA 143 03/09/2015 1021   NA 138 04/26/2014 0525   K 4.2 03/09/2015 1021   K 3.9 04/26/2014 0525   CL 98 04/26/2014 0525   CO2 31* 03/09/2015  1021   CO2 33* 04/26/2014 0525   GLUCOSE 78 03/09/2015 1021   GLUCOSE 253* 04/26/2014 0525   BUN 20.3 03/09/2015 1021   BUN 28* 04/26/2014 0525   CREATININE 1.0 03/09/2015 1021   CREATININE 1.35* 04/26/2014 0525   CALCIUM 9.3 03/09/2015 1021   CALCIUM 8.7 04/26/2014 0525   PROT 6.9 03/09/2015 1021   PROT 7.5 09/29/2013 1440   ALBUMIN 3.8 03/09/2015 1021   ALBUMIN 4.1 09/29/2013 1440   AST 24 03/09/2015 1021   AST 17 09/29/2013 1440   ALT 18 03/09/2015 1021   ALT 11 09/29/2013 1440   ALKPHOS 46 03/09/2015 1021   ALKPHOS 45 09/29/2013 1440   BILITOT 0.39 03/09/2015 1021   BILITOT 0.6 09/29/2013 1440   GFRNONAA 41* 04/26/2014 0525   GFRAA 47* 04/26/2014 0525    I No results found for: SPEP  Lab Results  Component Value Date   WBC 6.5 03/09/2015   NEUTROABS 4.9 03/09/2015   HGB 13.6 03/09/2015   HCT 42.9 03/09/2015   MCV 87.2 03/09/2015   PLT 213 03/09/2015      Chemistry      Component Value Date/Time   NA 143 03/09/2015 1021   NA 138 04/26/2014 0525   K 4.2 03/09/2015 1021   K 3.9 04/26/2014  0525   CL 98 04/26/2014 0525   CO2 31* 03/09/2015 1021   CO2 33* 04/26/2014 0525   BUN 20.3 03/09/2015 1021   BUN 28* 04/26/2014 0525   CREATININE 1.0 03/09/2015 1021   CREATININE 1.35* 04/26/2014 0525      Component Value Date/Time   CALCIUM 9.3 03/09/2015 1021   CALCIUM 8.7 04/26/2014 0525   ALKPHOS 46 03/09/2015 1021   ALKPHOS 45 09/29/2013 1440   AST 24 03/09/2015 1021   AST 17 09/29/2013 1440   ALT 18 03/09/2015 1021   ALT 11 09/29/2013 1440   BILITOT 0.39 03/09/2015 1021   BILITOT 0.6 09/29/2013 1440       No results found for: LABCA2  No components found for: CHYIF027  No results for input(s): INR in the last 168 hours.  Urinalysis    Component Value Date/Time   COLORURINE AMBER* 02/14/2013 0228   APPEARANCEUR CLOUDY* 02/14/2013 0228   LABSPEC 1.023 02/14/2013 0228   PHURINE 5.5 02/14/2013 0228   GLUCOSEU 250* 02/14/2013 0228   GLUCOSEU  NEGATIVE 12/05/2011 1027   HGBUR TRACE* 02/14/2013 0228   BILIRUBINUR SMALL* 02/14/2013 0228   KETONESUR 15* 02/14/2013 0228   PROTEINUR 100* 02/14/2013 0228   UROBILINOGEN 1.0 02/14/2013 0228   NITRITE NEGATIVE 02/14/2013 0228   LEUKOCYTESUR NEGATIVE 02/14/2013 0228    STUDIES: CLINICAL DATA: 64 year old female presenting for routine postlumpectomy follow-up. The patient has history of a right breast lumpectomy in October of 2014. Asymptomatic today.  EXAM: DIGITAL DIAGNOSTIC BILATERAL MAMMOGRAM WITH 3D TOMOSYNTHESIS AND CAD  COMPARISON: Previous exam(s).  ACR Breast Density Category c: The breast tissue is heterogeneously dense, which may obscure small masses.  FINDINGS: The lumpectomy site in the right breast appears stable as does the asymmetry in the right axilla. No suspicious calcifications, masses or areas of distortion are seen in the bilateral breasts.  Mammographic images were processed with CAD.  IMPRESSION: 1. Stable right breast lumpectomy site. No evidence of recurrent disease.  2. No mammographic evidence of malignancy in the bilateral breasts.  RECOMMENDATION: Diagnostic mammogram of the right breast. (Code:[id number]-22M)  I have discussed the findings and recommendations with the patient. Results were also provided in writing at the conclusion of the visit. If applicable, a reminder letter will be sent to the patient regarding the next appointment.  BI-RADS CATEGORY 2: Benign.   Electronically Signed  By: Ammie Ferrier M.D.  On: 12/27/2014 11  ASSESSMENT: 65 y.o. Monica Lee, [place] woman status post right lumpectomy and sentinel lymph node sampling 11/09/2012 for a pT2 pN0, stage IIA invasive ductal carcinoma, grade 3, triple negative, with an MIB-1 of 56%  (1) started adjuvant cyclophosphamide, methotrexate and fluorouracil 12-2012, received 3 cycles, then had a fall with injury to her left leg requiring skin grafting, and her  subsequent cycles were delayed.  (2) resuming CMF treatment 05/24/2013, completed 07/05/2013  (3) adjuvant radiation in [place], [place] 08/09/2013 through 09/15/2013  PLAN:  Esme is now more than two years out from her definitive surgery with no evidence of disease recurrence. This is very favorable.  At this point we are going to start seeing her on a yearly basis. She is looking forward to "graduation" 3 years from now.  She is concerned her Right breast is a bit firmer than her left. This is due to the radiation she receiveed and is an expected results. Also she has a firm mass at the Right axillary incision-- this is ging to be scar tissue and needs only faollow-up  She will have her next mammogram in December.  She knows to call for any problems that may develop before her next visit here  Chauncey Cruel, MD  03/09/2015 11:24 AM

## 2015-03-27 DIAGNOSIS — E8770 Fluid overload, unspecified: Secondary | ICD-10-CM | POA: Diagnosis not present

## 2015-03-27 DIAGNOSIS — E11319 Type 2 diabetes mellitus with unspecified diabetic retinopathy without macular edema: Secondary | ICD-10-CM | POA: Diagnosis not present

## 2015-03-27 DIAGNOSIS — E118 Type 2 diabetes mellitus with unspecified complications: Secondary | ICD-10-CM | POA: Diagnosis not present

## 2015-03-27 DIAGNOSIS — N183 Chronic kidney disease, stage 3 (moderate): Secondary | ICD-10-CM | POA: Diagnosis not present

## 2015-03-27 DIAGNOSIS — I129 Hypertensive chronic kidney disease with stage 1 through stage 4 chronic kidney disease, or unspecified chronic kidney disease: Secondary | ICD-10-CM | POA: Diagnosis not present

## 2015-03-27 DIAGNOSIS — Z940 Kidney transplant status: Secondary | ICD-10-CM | POA: Diagnosis not present

## 2015-03-27 DIAGNOSIS — M109 Gout, unspecified: Secondary | ICD-10-CM | POA: Diagnosis not present

## 2015-03-27 DIAGNOSIS — N2581 Secondary hyperparathyroidism of renal origin: Secondary | ICD-10-CM | POA: Diagnosis not present

## 2015-03-28 DIAGNOSIS — H26491 Other secondary cataract, right eye: Secondary | ICD-10-CM | POA: Diagnosis not present

## 2015-03-28 DIAGNOSIS — H401112 Primary open-angle glaucoma, right eye, moderate stage: Secondary | ICD-10-CM | POA: Diagnosis not present

## 2015-03-28 DIAGNOSIS — H402223 Chronic angle-closure glaucoma, left eye, severe stage: Secondary | ICD-10-CM | POA: Diagnosis not present

## 2015-04-03 DIAGNOSIS — Z9221 Personal history of antineoplastic chemotherapy: Secondary | ICD-10-CM | POA: Diagnosis not present

## 2015-04-03 DIAGNOSIS — Z881 Allergy status to other antibiotic agents status: Secondary | ICD-10-CM | POA: Diagnosis not present

## 2015-04-03 DIAGNOSIS — I132 Hypertensive heart and chronic kidney disease with heart failure and with stage 5 chronic kidney disease, or end stage renal disease: Secondary | ICD-10-CM | POA: Diagnosis not present

## 2015-04-03 DIAGNOSIS — Z853 Personal history of malignant neoplasm of breast: Secondary | ICD-10-CM | POA: Diagnosis not present

## 2015-04-03 DIAGNOSIS — J9601 Acute respiratory failure with hypoxia: Secondary | ICD-10-CM | POA: Diagnosis not present

## 2015-04-03 DIAGNOSIS — Z794 Long term (current) use of insulin: Secondary | ICD-10-CM | POA: Diagnosis not present

## 2015-04-03 DIAGNOSIS — I5021 Acute systolic (congestive) heart failure: Secondary | ICD-10-CM | POA: Diagnosis not present

## 2015-04-03 DIAGNOSIS — I252 Old myocardial infarction: Secondary | ICD-10-CM | POA: Diagnosis not present

## 2015-04-03 DIAGNOSIS — D849 Immunodeficiency, unspecified: Secondary | ICD-10-CM | POA: Diagnosis not present

## 2015-04-03 DIAGNOSIS — R001 Bradycardia, unspecified: Secondary | ICD-10-CM | POA: Diagnosis present

## 2015-04-03 DIAGNOSIS — N186 End stage renal disease: Secondary | ICD-10-CM | POA: Diagnosis not present

## 2015-04-03 DIAGNOSIS — E119 Type 2 diabetes mellitus without complications: Secondary | ICD-10-CM | POA: Diagnosis not present

## 2015-04-03 DIAGNOSIS — H5442 Blindness, left eye, normal vision right eye: Secondary | ICD-10-CM | POA: Diagnosis present

## 2015-04-03 DIAGNOSIS — Z9889 Other specified postprocedural states: Secondary | ICD-10-CM | POA: Diagnosis not present

## 2015-04-03 DIAGNOSIS — I5033 Acute on chronic diastolic (congestive) heart failure: Secondary | ICD-10-CM | POA: Diagnosis not present

## 2015-04-03 DIAGNOSIS — I2510 Atherosclerotic heart disease of native coronary artery without angina pectoris: Secondary | ICD-10-CM | POA: Diagnosis present

## 2015-04-03 DIAGNOSIS — J45909 Unspecified asthma, uncomplicated: Secondary | ICD-10-CM | POA: Diagnosis present

## 2015-04-03 DIAGNOSIS — Z945 Skin transplant status: Secondary | ICD-10-CM | POA: Diagnosis not present

## 2015-04-03 DIAGNOSIS — Z940 Kidney transplant status: Secondary | ICD-10-CM | POA: Diagnosis not present

## 2015-04-03 DIAGNOSIS — Z888 Allergy status to other drugs, medicaments and biological substances status: Secondary | ICD-10-CM | POA: Diagnosis not present

## 2015-04-03 DIAGNOSIS — Z9049 Acquired absence of other specified parts of digestive tract: Secondary | ICD-10-CM | POA: Diagnosis not present

## 2015-04-03 DIAGNOSIS — Z923 Personal history of irradiation: Secondary | ICD-10-CM | POA: Diagnosis not present

## 2015-04-03 DIAGNOSIS — I5031 Acute diastolic (congestive) heart failure: Secondary | ICD-10-CM | POA: Diagnosis not present

## 2015-04-03 DIAGNOSIS — J209 Acute bronchitis, unspecified: Secondary | ICD-10-CM | POA: Diagnosis present

## 2015-04-03 DIAGNOSIS — J9602 Acute respiratory failure with hypercapnia: Secondary | ICD-10-CM | POA: Diagnosis not present

## 2015-04-03 DIAGNOSIS — I313 Pericardial effusion (noninflammatory): Secondary | ICD-10-CM | POA: Diagnosis not present

## 2015-04-03 DIAGNOSIS — R0602 Shortness of breath: Secondary | ICD-10-CM | POA: Diagnosis not present

## 2015-04-03 DIAGNOSIS — Z90710 Acquired absence of both cervix and uterus: Secondary | ICD-10-CM | POA: Diagnosis not present

## 2015-04-03 DIAGNOSIS — I509 Heart failure, unspecified: Secondary | ICD-10-CM | POA: Diagnosis not present

## 2015-04-05 ENCOUNTER — Telehealth: Payer: Self-pay | Admitting: Cardiovascular Disease

## 2015-04-05 NOTE — Telephone Encounter (Signed)
New Message  [facility] asking about pt's pericardial effusion. Please call back and discuss.

## 2015-04-05 NOTE — Telephone Encounter (Signed)
I called Monica Lee at [facility]. Pt was admitted with diastolic CHF. Echo revealed a small - moderate pericardial effusion I have reviewed records from previous echoes This effusion has been present for several years.  Clinically there is no pericardial tamponade    Vannary Greening, Wonda Cheng, MD  04/05/2015 12:27 PM    [facility] [address],  [place], [place]  [address] Pager [phone] Phone: [phone]; Fax: [phone]   [facility]  [address] [place], [place]  [address] [phone]   Fax [phone]

## 2015-04-05 NOTE — Telephone Encounter (Signed)
I will forward this to DOD/ Dr Acie Fredrickson to return call.

## 2015-04-06 ENCOUNTER — Telehealth: Payer: Self-pay | Admitting: Cardiovascular Disease

## 2015-04-06 NOTE — Telephone Encounter (Signed)
Left message to call back  

## 2015-04-06 NOTE — Telephone Encounter (Signed)
New message    Daughter calling - mother in hospital @ [facility] - pending discharge.    Daughter has concerns & questions  regarding her mother.

## 2015-04-09 ENCOUNTER — Other Ambulatory Visit: Payer: Self-pay | Admitting: Internal Medicine

## 2015-04-13 DIAGNOSIS — I313 Pericardial effusion (noninflammatory): Secondary | ICD-10-CM | POA: Diagnosis not present

## 2015-04-14 DIAGNOSIS — I313 Pericardial effusion (noninflammatory): Secondary | ICD-10-CM | POA: Diagnosis not present

## 2015-04-14 DIAGNOSIS — I429 Cardiomyopathy, unspecified: Secondary | ICD-10-CM | POA: Diagnosis not present

## 2015-04-14 DIAGNOSIS — N186 End stage renal disease: Secondary | ICD-10-CM | POA: Diagnosis not present

## 2015-04-14 DIAGNOSIS — I10 Essential (primary) hypertension: Secondary | ICD-10-CM | POA: Diagnosis not present

## 2015-04-18 NOTE — Telephone Encounter (Signed)
See phone note from 04-05-15

## 2015-04-24 DIAGNOSIS — I313 Pericardial effusion (noninflammatory): Secondary | ICD-10-CM | POA: Diagnosis not present

## 2015-04-24 DIAGNOSIS — I517 Cardiomegaly: Secondary | ICD-10-CM | POA: Diagnosis not present

## 2015-05-02 ENCOUNTER — Encounter: Payer: Self-pay | Admitting: Internal Medicine

## 2015-05-02 DIAGNOSIS — E113591 Type 2 diabetes mellitus with proliferative diabetic retinopathy without macular edema, right eye: Secondary | ICD-10-CM | POA: Diagnosis not present

## 2015-05-02 DIAGNOSIS — H44522 Atrophy of globe, left eye: Secondary | ICD-10-CM | POA: Diagnosis not present

## 2015-05-02 DIAGNOSIS — Z961 Presence of intraocular lens: Secondary | ICD-10-CM | POA: Diagnosis not present

## 2015-05-02 DIAGNOSIS — H35371 Puckering of macula, right eye: Secondary | ICD-10-CM | POA: Diagnosis not present

## 2015-05-02 LAB — HM DIABETES EYE EXAM

## 2015-05-08 ENCOUNTER — Other Ambulatory Visit: Payer: Self-pay | Admitting: Internal Medicine

## 2015-05-20 ENCOUNTER — Other Ambulatory Visit: Payer: Self-pay | Admitting: Internal Medicine

## 2015-06-04 ENCOUNTER — Other Ambulatory Visit: Payer: Self-pay | Admitting: Internal Medicine

## 2015-06-05 ENCOUNTER — Other Ambulatory Visit: Payer: Self-pay | Admitting: Internal Medicine

## 2015-06-08 DIAGNOSIS — M25562 Pain in left knee: Secondary | ICD-10-CM | POA: Diagnosis not present

## 2015-06-08 DIAGNOSIS — Y92009 Unspecified place in unspecified non-institutional (private) residence as the place of occurrence of the external cause: Secondary | ICD-10-CM | POA: Diagnosis not present

## 2015-06-08 DIAGNOSIS — W2203XA Walked into furniture, initial encounter: Secondary | ICD-10-CM | POA: Diagnosis not present

## 2015-06-08 DIAGNOSIS — Z888 Allergy status to other drugs, medicaments and biological substances status: Secondary | ICD-10-CM | POA: Diagnosis not present

## 2015-06-08 DIAGNOSIS — Y999 Unspecified external cause status: Secondary | ICD-10-CM | POA: Diagnosis not present

## 2015-06-15 DIAGNOSIS — M25562 Pain in left knee: Secondary | ICD-10-CM | POA: Diagnosis not present

## 2015-06-27 DIAGNOSIS — N183 Chronic kidney disease, stage 3 (moderate): Secondary | ICD-10-CM | POA: Diagnosis not present

## 2015-06-27 DIAGNOSIS — N2581 Secondary hyperparathyroidism of renal origin: Secondary | ICD-10-CM | POA: Diagnosis not present

## 2015-06-27 DIAGNOSIS — E118 Type 2 diabetes mellitus with unspecified complications: Secondary | ICD-10-CM | POA: Diagnosis not present

## 2015-06-27 DIAGNOSIS — I129 Hypertensive chronic kidney disease with stage 1 through stage 4 chronic kidney disease, or unspecified chronic kidney disease: Secondary | ICD-10-CM | POA: Diagnosis not present

## 2015-06-27 DIAGNOSIS — E11319 Type 2 diabetes mellitus with unspecified diabetic retinopathy without macular edema: Secondary | ICD-10-CM | POA: Diagnosis not present

## 2015-06-27 DIAGNOSIS — E782 Mixed hyperlipidemia: Secondary | ICD-10-CM | POA: Diagnosis not present

## 2015-06-27 DIAGNOSIS — Z940 Kidney transplant status: Secondary | ICD-10-CM | POA: Diagnosis not present

## 2015-06-28 DIAGNOSIS — N183 Chronic kidney disease, stage 3 (moderate): Secondary | ICD-10-CM | POA: Diagnosis not present

## 2015-06-28 DIAGNOSIS — N2581 Secondary hyperparathyroidism of renal origin: Secondary | ICD-10-CM | POA: Diagnosis not present

## 2015-06-28 DIAGNOSIS — M109 Gout, unspecified: Secondary | ICD-10-CM | POA: Diagnosis not present

## 2015-06-28 DIAGNOSIS — Z940 Kidney transplant status: Secondary | ICD-10-CM | POA: Diagnosis not present

## 2015-07-07 ENCOUNTER — Other Ambulatory Visit: Payer: Self-pay | Admitting: Internal Medicine

## 2015-07-07 DIAGNOSIS — Z940 Kidney transplant status: Secondary | ICD-10-CM | POA: Diagnosis not present

## 2015-07-07 NOTE — Telephone Encounter (Signed)
Ok to contact pt  Prednisone is not normally a chronic medication for most patients  Can pt say why she needs refill?

## 2015-07-26 ENCOUNTER — Other Ambulatory Visit: Payer: Self-pay | Admitting: Internal Medicine

## 2015-08-08 DIAGNOSIS — I132 Hypertensive heart and chronic kidney disease with heart failure and with stage 5 chronic kidney disease, or end stage renal disease: Secondary | ICD-10-CM | POA: Diagnosis not present

## 2015-08-08 DIAGNOSIS — Z9889 Other specified postprocedural states: Secondary | ICD-10-CM | POA: Diagnosis not present

## 2015-08-08 DIAGNOSIS — I252 Old myocardial infarction: Secondary | ICD-10-CM | POA: Diagnosis not present

## 2015-08-08 DIAGNOSIS — Z886 Allergy status to analgesic agent status: Secondary | ICD-10-CM | POA: Diagnosis not present

## 2015-08-08 DIAGNOSIS — E119 Type 2 diabetes mellitus without complications: Secondary | ICD-10-CM | POA: Diagnosis not present

## 2015-08-08 DIAGNOSIS — N186 End stage renal disease: Secondary | ICD-10-CM | POA: Diagnosis not present

## 2015-08-08 DIAGNOSIS — Z9049 Acquired absence of other specified parts of digestive tract: Secondary | ICD-10-CM | POA: Diagnosis not present

## 2015-08-08 DIAGNOSIS — L03311 Cellulitis of abdominal wall: Secondary | ICD-10-CM | POA: Diagnosis not present

## 2015-08-08 DIAGNOSIS — Z9221 Personal history of antineoplastic chemotherapy: Secondary | ICD-10-CM | POA: Diagnosis not present

## 2015-08-08 DIAGNOSIS — Z90710 Acquired absence of both cervix and uterus: Secondary | ICD-10-CM | POA: Diagnosis not present

## 2015-08-08 DIAGNOSIS — Z794 Long term (current) use of insulin: Secondary | ICD-10-CM | POA: Diagnosis not present

## 2015-08-08 DIAGNOSIS — Z6831 Body mass index (BMI) 31.0-31.9, adult: Secondary | ICD-10-CM | POA: Diagnosis not present

## 2015-08-08 DIAGNOSIS — I5030 Unspecified diastolic (congestive) heart failure: Secondary | ICD-10-CM | POA: Diagnosis not present

## 2015-08-08 DIAGNOSIS — Z945 Skin transplant status: Secondary | ICD-10-CM | POA: Diagnosis not present

## 2015-08-08 DIAGNOSIS — L02211 Cutaneous abscess of abdominal wall: Secondary | ICD-10-CM | POA: Diagnosis not present

## 2015-08-08 DIAGNOSIS — E1122 Type 2 diabetes mellitus with diabetic chronic kidney disease: Secondary | ICD-10-CM | POA: Diagnosis not present

## 2015-08-08 DIAGNOSIS — R109 Unspecified abdominal pain: Secondary | ICD-10-CM | POA: Diagnosis not present

## 2015-08-08 DIAGNOSIS — Z923 Personal history of irradiation: Secondary | ICD-10-CM | POA: Diagnosis not present

## 2015-08-08 DIAGNOSIS — Z853 Personal history of malignant neoplasm of breast: Secondary | ICD-10-CM | POA: Diagnosis not present

## 2015-08-08 DIAGNOSIS — Z79899 Other long term (current) drug therapy: Secondary | ICD-10-CM | POA: Diagnosis not present

## 2015-08-08 DIAGNOSIS — E669 Obesity, unspecified: Secondary | ICD-10-CM | POA: Diagnosis present

## 2015-08-08 DIAGNOSIS — H5442 Blindness, left eye, normal vision right eye: Secondary | ICD-10-CM | POA: Diagnosis present

## 2015-08-08 DIAGNOSIS — Z940 Kidney transplant status: Secondary | ICD-10-CM | POA: Diagnosis not present

## 2015-08-08 DIAGNOSIS — J45909 Unspecified asthma, uncomplicated: Secondary | ICD-10-CM | POA: Diagnosis not present

## 2015-08-08 DIAGNOSIS — E039 Hypothyroidism, unspecified: Secondary | ICD-10-CM | POA: Diagnosis not present

## 2015-08-08 DIAGNOSIS — B954 Other streptococcus as the cause of diseases classified elsewhere: Secondary | ICD-10-CM | POA: Diagnosis present

## 2015-08-08 DIAGNOSIS — Z881 Allergy status to other antibiotic agents status: Secondary | ICD-10-CM | POA: Diagnosis not present

## 2015-08-08 NOTE — Progress Notes (Signed)
Opened in error

## 2015-08-12 DIAGNOSIS — I509 Heart failure, unspecified: Secondary | ICD-10-CM | POA: Diagnosis not present

## 2015-08-12 DIAGNOSIS — I132 Hypertensive heart and chronic kidney disease with heart failure and with stage 5 chronic kidney disease, or end stage renal disease: Secondary | ICD-10-CM | POA: Diagnosis not present

## 2015-08-12 DIAGNOSIS — I2510 Atherosclerotic heart disease of native coronary artery without angina pectoris: Secondary | ICD-10-CM | POA: Diagnosis not present

## 2015-08-12 DIAGNOSIS — L02211 Cutaneous abscess of abdominal wall: Secondary | ICD-10-CM | POA: Diagnosis not present

## 2015-08-12 DIAGNOSIS — E1122 Type 2 diabetes mellitus with diabetic chronic kidney disease: Secondary | ICD-10-CM | POA: Diagnosis not present

## 2015-08-12 DIAGNOSIS — N186 End stage renal disease: Secondary | ICD-10-CM | POA: Diagnosis not present

## 2015-08-14 DIAGNOSIS — I132 Hypertensive heart and chronic kidney disease with heart failure and with stage 5 chronic kidney disease, or end stage renal disease: Secondary | ICD-10-CM | POA: Diagnosis not present

## 2015-08-14 DIAGNOSIS — I2510 Atherosclerotic heart disease of native coronary artery without angina pectoris: Secondary | ICD-10-CM | POA: Diagnosis not present

## 2015-08-14 DIAGNOSIS — E1122 Type 2 diabetes mellitus with diabetic chronic kidney disease: Secondary | ICD-10-CM | POA: Diagnosis not present

## 2015-08-14 DIAGNOSIS — N186 End stage renal disease: Secondary | ICD-10-CM | POA: Diagnosis not present

## 2015-08-14 DIAGNOSIS — N2581 Secondary hyperparathyroidism of renal origin: Secondary | ICD-10-CM | POA: Diagnosis not present

## 2015-08-14 DIAGNOSIS — I509 Heart failure, unspecified: Secondary | ICD-10-CM | POA: Diagnosis not present

## 2015-08-14 DIAGNOSIS — L02211 Cutaneous abscess of abdominal wall: Secondary | ICD-10-CM | POA: Diagnosis not present

## 2015-08-16 ENCOUNTER — Other Ambulatory Visit: Payer: Self-pay | Admitting: Internal Medicine

## 2015-08-16 DIAGNOSIS — E1122 Type 2 diabetes mellitus with diabetic chronic kidney disease: Secondary | ICD-10-CM | POA: Diagnosis not present

## 2015-08-16 DIAGNOSIS — I132 Hypertensive heart and chronic kidney disease with heart failure and with stage 5 chronic kidney disease, or end stage renal disease: Secondary | ICD-10-CM | POA: Diagnosis not present

## 2015-08-16 DIAGNOSIS — I509 Heart failure, unspecified: Secondary | ICD-10-CM | POA: Diagnosis not present

## 2015-08-16 DIAGNOSIS — I2510 Atherosclerotic heart disease of native coronary artery without angina pectoris: Secondary | ICD-10-CM | POA: Diagnosis not present

## 2015-08-16 DIAGNOSIS — L02211 Cutaneous abscess of abdominal wall: Secondary | ICD-10-CM | POA: Diagnosis not present

## 2015-08-16 DIAGNOSIS — N186 End stage renal disease: Secondary | ICD-10-CM | POA: Diagnosis not present

## 2015-08-18 DIAGNOSIS — N186 End stage renal disease: Secondary | ICD-10-CM | POA: Diagnosis not present

## 2015-08-18 DIAGNOSIS — I132 Hypertensive heart and chronic kidney disease with heart failure and with stage 5 chronic kidney disease, or end stage renal disease: Secondary | ICD-10-CM | POA: Diagnosis not present

## 2015-08-18 DIAGNOSIS — I2510 Atherosclerotic heart disease of native coronary artery without angina pectoris: Secondary | ICD-10-CM | POA: Diagnosis not present

## 2015-08-18 DIAGNOSIS — E1122 Type 2 diabetes mellitus with diabetic chronic kidney disease: Secondary | ICD-10-CM | POA: Diagnosis not present

## 2015-08-18 DIAGNOSIS — I509 Heart failure, unspecified: Secondary | ICD-10-CM | POA: Diagnosis not present

## 2015-08-18 DIAGNOSIS — L02211 Cutaneous abscess of abdominal wall: Secondary | ICD-10-CM | POA: Diagnosis not present

## 2015-08-21 ENCOUNTER — Emergency Department (HOSPITAL_COMMUNITY)
Admission: EM | Admit: 2015-08-21 | Discharge: 2015-08-21 | Disposition: A | Discharge status: Home / Self Care | Payer: Medicare Other | Attending: Emergency Medicine | Admitting: Emergency Medicine

## 2015-08-21 ENCOUNTER — Encounter (HOSPITAL_COMMUNITY): Payer: Self-pay | Admitting: Vascular Surgery

## 2015-08-21 ENCOUNTER — Emergency Department (HOSPITAL_COMMUNITY): Payer: Medicare Other

## 2015-08-21 DIAGNOSIS — I130 Hypertensive heart and chronic kidney disease with heart failure and stage 1 through stage 4 chronic kidney disease, or unspecified chronic kidney disease: Secondary | ICD-10-CM | POA: Diagnosis not present

## 2015-08-21 DIAGNOSIS — J45909 Unspecified asthma, uncomplicated: Secondary | ICD-10-CM | POA: Diagnosis not present

## 2015-08-21 DIAGNOSIS — Z794 Long term (current) use of insulin: Secondary | ICD-10-CM | POA: Diagnosis not present

## 2015-08-21 DIAGNOSIS — I5033 Acute on chronic diastolic (congestive) heart failure: Secondary | ICD-10-CM | POA: Insufficient documentation

## 2015-08-21 DIAGNOSIS — L02211 Cutaneous abscess of abdominal wall: Secondary | ICD-10-CM | POA: Diagnosis not present

## 2015-08-21 DIAGNOSIS — E11319 Type 2 diabetes mellitus with unspecified diabetic retinopathy without macular edema: Secondary | ICD-10-CM | POA: Diagnosis not present

## 2015-08-21 DIAGNOSIS — I252 Old myocardial infarction: Secondary | ICD-10-CM | POA: Insufficient documentation

## 2015-08-21 DIAGNOSIS — N183 Chronic kidney disease, stage 3 (moderate): Secondary | ICD-10-CM | POA: Insufficient documentation

## 2015-08-21 DIAGNOSIS — I2510 Atherosclerotic heart disease of native coronary artery without angina pectoris: Secondary | ICD-10-CM | POA: Diagnosis not present

## 2015-08-21 DIAGNOSIS — N186 End stage renal disease: Secondary | ICD-10-CM | POA: Diagnosis not present

## 2015-08-21 DIAGNOSIS — E1122 Type 2 diabetes mellitus with diabetic chronic kidney disease: Secondary | ICD-10-CM | POA: Insufficient documentation

## 2015-08-21 DIAGNOSIS — Z7982 Long term (current) use of aspirin: Secondary | ICD-10-CM | POA: Diagnosis not present

## 2015-08-21 DIAGNOSIS — Z8673 Personal history of transient ischemic attack (TIA), and cerebral infarction without residual deficits: Secondary | ICD-10-CM | POA: Diagnosis not present

## 2015-08-21 DIAGNOSIS — I509 Heart failure, unspecified: Secondary | ICD-10-CM | POA: Diagnosis not present

## 2015-08-21 DIAGNOSIS — Z853 Personal history of malignant neoplasm of breast: Secondary | ICD-10-CM | POA: Diagnosis not present

## 2015-08-21 DIAGNOSIS — E1159 Type 2 diabetes mellitus with other circulatory complications: Secondary | ICD-10-CM | POA: Insufficient documentation

## 2015-08-21 DIAGNOSIS — E039 Hypothyroidism, unspecified: Secondary | ICD-10-CM | POA: Insufficient documentation

## 2015-08-21 DIAGNOSIS — M25561 Pain in right knee: Secondary | ICD-10-CM | POA: Diagnosis not present

## 2015-08-21 DIAGNOSIS — I132 Hypertensive heart and chronic kidney disease with heart failure and with stage 5 chronic kidney disease, or end stage renal disease: Secondary | ICD-10-CM | POA: Diagnosis not present

## 2015-08-21 MED ORDER — FENTANYL CITRATE (PF) 100 MCG/2ML IJ SOLN
50.0000 ug | Freq: Once | INTRAMUSCULAR | Status: AC
  Administered 2015-08-21: 50 ug via INTRAMUSCULAR
  Filled 2015-08-21: qty 2

## 2015-08-21 MED ORDER — TRAMADOL HCL 50 MG PO TABS: 50.0000 mg | ORAL_TABLET | Freq: Four times a day (QID) | ORAL | 0 refills | Status: AC | PRN

## 2015-08-21 NOTE — ED Triage Notes (Signed)
Pt reports to the ED for eval of right knee pain. Denies any known injury. Has hx of arthritis and states this feels similar. Pt A&Ox4, resp e/u, and skin warm and dry.

## 2015-08-21 NOTE — ED Provider Notes (Signed)
[facility] DEPT Provider Note   CSN: [id number] Arrival date & time: 08/21/15  1639  First Provider Contact:   First MD Initiated Contact with Patient 08/21/15 1836      By signing my name below, I, Evelene Croon, attest that this documentation has been prepared under the direction and in the presence of non-physician practitioner, Etta Quill, NP . Electronically Signed: Evelene Croon, Scribe. 08/21/2015. 6:43 PM.  History   Chief Complaint Chief Complaint  Patient presents with  . Knee Pain   The history is provided by the patient.  Knee Pain   This is a new problem. The current episode started more than 2 days ago. The problem occurs constantly. The problem has not changed since onset.The pain is present in the right knee. The pain is moderate. Pertinent negatives include no numbness.     HPI Comments:  Monica Lee is a 65 y.o. female with a history of arthritis, who presents to the Emergency Department complaining of moderate, right knee pain x 7 days. She denies recent fall/injury. Pt has a h/o pain in the left knee; states she was told it may have been due to gout.   PCP: [name]   Past Medical History:  Diagnosis Date  . Anemia in chronic kidney disease   . Asthma   . At risk for sleep apnea    STOP-BANG= 4   SENT TO PCP  03-25-2013  . Blind left eye    OCCLUDED CORNEA,  FUNDI--  FAILED SURGERY REPAIR 2003  . 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 Chronic diastolic CHF (congestive heart failure) ([place])    a. Cardiac MRI (10/2012): Moderate to severe LVH, EF 55%, chordal SAM but no LVOT gradient, mod circumferential effusion, mild LAE.  b.  Echocardiogram (11/06/12): Severe LVH, EF 123456, grade 1 diastolic dysfunction, moderate effusion.  . Chronic kidney disease (CKD), stage III (moderate)    NEPHROLOGIST--  DR Freddrick March  . Coronary artery disease  CARDIOLOGIST--  DR Johnsie Cancel   NON-OBSTRUCTIVE CAD  PER CARDIAC CATH  2011  . Diabetic retinopathy    RIGHT EYE  . DJD (degenerative joint disease)   . Gastroparesis due to DM ([place])   . GERD (gastroesophageal reflux disease)   . History of CVA (cerebrovascular accident) without residual deficits    2008  AND TIA IN 2008  W/ NO RESIDUAL  . History of kidney stones   . History of myocardial infarction    2011--  S/P CARDIAC CATH ([place])  NON-OBSTRUCTIVE CAD  . History of peptic ulcer    2011--  RESOLVED  . Hx of colonic polyps   . Hyperlipidemia   . Hyperparathyroidism, secondary renal ([place])   . Hypertension   . Hypothyroidism   . Open thigh wound    ANTERIOR  . Pericardial effusion without cardiac tamponade    PER DR NISHAN  NOTE --  NOT MALIGNANT  ? RELATED TO HYPOTHYROIDISM  . S/P kidney transplant    CADAVERIC --  05/2006  . Thyromegaly    MULTINODULER GOITER  . Trigeminal neuralgia   . 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 Wears glasses     Patient Active Problem List   Diagnosis Date Noted  . Diabetes mellitus following renal transplant ([place]) 03/09/2015  . Gait disorder 01/11/2015  . Diarrhea 04/25/2014  .  CHF exacerbation ([place]) 04/24/2014  . Abscess of arm, left 04/24/2014  . Allergic rhinitis 03/30/2014  . Arthritis 03/30/2014  . History of skin graft 06/14/2013  . OSA (obstructive sleep apnea) 04/08/2013  . Traumatic hematoma of thigh 02/14/2013  . Cellulitis and abscess of leg 02/14/2013  . Essential hypertension, benign 11/19/2012  . S/P kidney transplant 11/19/2012  . CKD (chronic kidney disease) 11/19/2012  . Pericardial effusion 11/19/2012  . Hypothyroidism 11/13/2012  . Acute on chronic diastolic heart failure ([place]) 11/11/2012  . Breast cancer of lower-outer quadrant of right female breast ([place]) 10/14/2012  . Olecranon bursitis of right elbow 10/11/2012  . Right shoulder pain 04/09/2012  . Asthma exacerbation 12/28/2011  .  Cellulitis, abdominal wall 12/28/2011  . Hemorrhoids 12/05/2011  . Preventative health care 12/29/2010  . Multinodular goiter 11/17/2010  . Chronic diastolic heart failure ([place]) 11/27/2009  . DIABETIC  RETINOPATHY 11/14/2009  . Elevated lipids 11/14/2009  . Anemia in neoplastic disease 11/14/2009  . INCREASED INTRAOCULAR PRESSURE 11/14/2009  . MYOCARDIAL INFARCTION, HX OF 11/14/2009  . Coronary atherosclerosis 11/14/2009  . Peptic ulcer 11/14/2009  . Gastroparesis 11/14/2009  . LOW BACK PAIN, CHRONIC 11/14/2009  . TRANSIENT ISCHEMIC ATTACK, HX OF 11/14/2009  . COLONIC POLYPS, HX OF 11/14/2009  . Edema 10/03/2009  . Type 2 diabetes mellitus with circulatory disorder ([place]) 07/14/2009  . Hypertensive heart disease 07/14/2009  . MYOCARDIAL INFARCTION, ACUTE, SUBENDOCARDIAL 07/14/2009  . CAD, NATIVE VESSEL 07/14/2009  . Asthma 07/14/2009  . GERD 07/14/2009  . NEPHROLITHIASIS, HX OF 07/14/2009  . KIDNEY TRANSPLANTATION 07/14/2009    Past Surgical History:  Procedure Laterality Date  . BREAST BIOPSY  2010  . BREAST LUMPECTOMY WITH SENTINEL LYMPH NODE BIOPSY Right 11/09/2012   Procedure: RIGHT BREAST LUMPECTOMY WITH SENTINEL LYMPH NODE REMOVAL;  Surgeon: Haywood Lasso, MD;  Location: [facility];  Service: General;  Laterality: Right;  . CARDIAC CATHETERIZATION  04-12-2009  DR EVELYNE GOUDREAU ([facility] IN [place], [place])   NON-OBSTRUCTIVE CAD/  mLAD 40%,  oLAD 50%,  dCFX 40%,  OM1  40%,  mRCA  &  dRCA  50%/  LVEF 65% /  ELEVATED  LVEDP  . CARDIOVASCULAR STRESS TEST  11-04-2012  DR NISHAN   HIGH RISK NUCLEAR STUDY/  FIXED DEFECT AFFECTING ENTIRE INFEROLATERAL AND ANTEROLATERAL WALL/  MODERATE ISCHEMIA  AT MID/ APICAL ANTEROR AND INFERIOR WALL/  GLOBAL HYPOKINESIS/  LVEF 28% (FELT TO BE FALSE +,  ECHO & cMRI  NORMAL EF AND NORMAL WM)  . CATARACT EXTRACTION W/ INTRAOCULAR LENS  IMPLANT, BILATERAL    . CHOLECYSTECTOMY  2008  . EYE SURGERY Left 2003   REPAIR CORNEA  . FOOT SURGERY Right 2013    . HEMORRHOID SURGERY  01/23/2012   Procedure: HEMORRHOIDECTOMY PROLAPSED;  Surgeon: Leighton Ruff, MD;  Location[facility];  Service: General;  Laterality: N/A;  . INCISION AND DRAINAGE OF WOUND Left 02/18/2013   Procedure: IRRIGATION AND DEBRIDEMENT THIGH WOUND;  Surgeon: Rolm Bookbinder, MD;  Location: [facility];  Service: General;  Laterality: Left;  . IRRIGATION AND DEBRIDEMENT ABSCESS Left 02/14/2013   Procedure: IRRIGATION AND DEBRIDEMENT LEFT THIGH ABSCESS;  Surgeon: Joyice Faster. Cornett, MD;  Location: [facility];  Service: General;  Laterality: Left;  . PORTACATH PLACEMENT Right 11/30/2012   Procedure: INSERTION PORT-A-CATH;  Surgeon: Haywood Lasso, MD;  Location: [facility];  Service: General;  Laterality: Right;  . REPAIR  INTESTINAL PERFORATION SURGERY  01-31-2009  . SKIN SPLIT GRAFT Left 03/29/2013  Procedure: SKIN GRAFT SPLIT THICKNESS WITH A-CELL AND VAC TO LEFT THIGH;  Surgeon: Theodoro Kos, DO;  Location: [facility];  Service: Plastics;  Laterality: Left;  . TRANSPLANTATION RENAL  05/2006   CADAVERIC  . TRANSTHORACIC ECHOCARDIOGRAM  01-19-2013  DR NISHAN   SEVERE LVH/  EF [id number]  GRADE I DIASTOLIC DYSFUNCTION/  MODERATE LAE/  MILD IMPROVEMENT OF MODERATE CIRCUMFERENTIAL INFERIOROLATERAL PERICARDIAL EFFUSION WITH NO TAMPONADE  . VAGINAL HYSTERECTOMY  1990's   W/  BILATERAL SALPINGOOPHORECTOMY    OB History    No data available       Home Medications    Prior to Admission medications   Medication Sig Start Date End Date Taking? Authorizing Provider  albuterol (ACCUNEB) 0.63 MG/3ML nebulizer solution Take 1 ampule by nebulization every 6 (six) hours as needed for wheezing.    Historical Provider, MD  albuterol (PROVENTIL HFA;VENTOLIN HFA) 108 (90 BASE) MCG/ACT inhaler Inhale 2 puffs into the lungs every 6 (six) hours as needed for wheezing or shortness of breath. 01/04/14   Biagio Borg, MD  amLODipine (NORVASC) 5 MG tablet Take 5  mg by mouth every evening.     Historical Provider, MD  aspirin EC 81 MG tablet Take 81 mg by mouth daily.    Historical Provider, MD  azaTHIOprine (IMURAN) 50 MG tablet Take 75 mg by mouth daily.     Historical Provider, MD  brimonidine (ALPHAGAN) 0.2 % ophthalmic solution Place 1 drop into the right eye 2 (two) times daily.  03/10/13   Historical Provider, MD  calcitRIOL (ROCALTROL) 0.25 MCG capsule TK 1 C PO D 12/29/14   Historical Provider, MD  docusate sodium (COLACE) 50 MG capsule Take 200 mg by mouth 2 (two) times daily.    Historical Provider, MD  feeding supplement, GLUCERNA SHAKE, (GLUCERNA SHAKE) LIQD Take 237 mLs by mouth 2 (two) times daily between meals. 02/23/13   Hosie Poisson, MD  furosemide (LASIX) 80 MG tablet Take 160 mg by mouth 2 (two) times daily.     Historical Provider, MD  insulin NPH-regular Human (NOVOLIN 70/30) (70-30) 100 UNIT/ML injection 15-40 units inject under skin twice a day 08/25/14   Philemon Kingdom, MD  Insulin Syringe-Needle U-100 (INSULIN SYRINGE .5CC/31GX5/16") 31G X 5/16" 0.5 ML MISC USE TO INJECT INSULIN 5 TIMES DAILY AS INSTRUCTED 04/10/15   Philemon Kingdom, MD  isosorbide mononitrate (IMDUR) 60 MG 24 hr tablet Take 1 tablet (60 mg total) by mouth daily. 11/25/14   Biagio Borg, MD  KLOR-CON M20 20 MEQ tablet Take 10 mEq by mouth 2 (two) times daily. Takes 1/2 tab in morning and 1/2 at night. 02/11/13   Historical Provider, MD  levothyroxine (SYNTHROID, LEVOTHROID) 75 MCG tablet Take 1 tablet (75 mcg total) by mouth daily. 03/25/13   Biagio Borg, MD  magnesium oxide (MAG-OX) 400 MG tablet Take 400 mg by mouth 2 (two) times daily.    Historical Provider, MD  metoprolol (LOPRESSOR) 100 MG tablet TAKE 1 TABLET BY MOUTH TWICE DAILY 08/10/14   Josue Hector, MD  minoxidil (LONITEN) 2.5 MG tablet TAKE 1 TABLET BY MOUTH TWICE DAILY 06/05/15   Biagio Borg, MD  nitroGLYCERIN (NITROSTAT) 0.4 MG SL tablet Place 1 tablet (0.4 mg total) under the tongue every 5 (five)  minutes as needed for chest pain. 02/24/15   Josue Hector, MD  polyethylene glycol powder (GLYCOLAX/MIRALAX) powder MIX 17GM AS DIRECTED DAILY 01/30/15   Biagio Borg, MD  polyethylene glycol powder (  GLYCOLAX/MIRALAX) powder Mix 17g in juice/water daily as needed Overdue for yearly physical/labs must see MD for refills 08/17/15   Biagio Borg, MD  pravastatin (PRAVACHOL) 20 MG tablet Take 10 mg by mouth at bedtime.      Historical Provider, MD  prednisoLONE acetate (PRED FORTE) 1 % ophthalmic suspension Place 1 drop into the left eye 2 (two) times daily.     Historical Provider, MD  predniSONE (DELTASONE) 10 MG tablet TAKE 1 TABLET(10 MG) BY MOUTH EVERY MORNING 07/26/15   Biagio Borg, MD  tacrolimus (PROGRAF) 1 MG capsule Take 9 mg by mouth 2 (two) times daily.     Historical Provider, MD  timolol (TIMOPTIC) 0.25 % ophthalmic solution Place 1 drop into the right eye 2 (two) times daily.    Historical Provider, MD    Family History Family History  Problem Relation Age of Onset  . Heart disease Father   . Arthritis Other     parent  . Heart disease Other     parent  . Hypertension Other     parent  . Diabetes Other     parent  . Diabetes Other     grandparent  . Thyroid disease Other     several  . Kidney disease Neg Hx     Social History Social History  Substance Use Topics  . Smoking status: Never Smoker  . Smokeless tobacco: Never Used  . Alcohol use No     Allergies   2,4-d dimethylamine (amisol); Keflex [cephalexin]; Propoxyphene n-acetaminophen; and Vancomycin   Review of Systems Review of Systems  Musculoskeletal: Positive for arthralgias.  Neurological: Negative for weakness and numbness.     Physical Exam Updated Vital Signs BP (!) 113/51 (BP Location: Right Arm)   Pulse 66   Temp 98.8 F (37.1 C) (Oral)   Resp 16   SpO2 94%   Physical Exam  Constitutional: She is oriented to person, place, and time. She appears well-developed and well-nourished. No  distress.  HENT:  Head: Normocephalic and atraumatic.  Eyes: Conjunctivae are normal.  Cardiovascular: Normal rate, regular rhythm, normal heart sounds and intact distal pulses.   Pulmonary/Chest: Effort normal and breath sounds normal. No respiratory distress.  Musculoskeletal:  Patellar tenderness noted;  increased pain with ROM.   No obvious bruising or deformity noted.  Neurological: She is alert and oriented to person, place, and time.  Skin: Skin is warm and dry.  Psychiatric: She has a normal mood and affect.  Nursing note and vitals reviewed.   ED Treatments / Results  DIAGNOSTIC STUDIES:  Oxygen Saturation is 94% on RA, adequate by my interpretation.    COORDINATION OF CARE:  6:40 PM Discussed treatment plan with pt at bedside and pt agreed to plan.  Labs (all labs ordered are listed, but only abnormal results are displayed) Labs Reviewed - No data to display  EKG  EKG Interpretation None       Radiology Dg Knee Complete 4 Views Right  Result Date: 08/21/2015 CLINICAL DATA:  Right patella pain x 1 week. This region feels warm today per patient. No injury. Hx of gout. EXAM: RIGHT KNEE - COMPLETE 4+ VIEW COMPARISON:  01/28/2014 FINDINGS: Enthesophyte at the upper pole of the patella, stable in appearance. No significant joint effusion. No acute fracture or subluxation. Extensive vascular calcifications are noted. IMPRESSION: No evidence for acute  abnormality. Electronically Signed   By: Nolon Nations M.D.   On: 08/21/2015 19:38    Procedures  Procedures   Medications Ordered in ED Medications - No data to display   Initial Impression / Assessment and Plan / ED Course  I have reviewed the triage vital signs and the nursing notes.  Pertinent labs & imaging results that were available during my care of the patient were reviewed by me and considered in my medical decision making (see chart for details).  Clinical Course    Patient X-Ray negative for  obvious fracture or dislocation.  Pt advised to follow up with orthopedics. Conservative therapy recommended and discussed. Patient will be discharged home & is agreeable with above plan. Returns precautions discussed. Pt appears safe for discharge.  Final Clinical Impressions(s) / ED Diagnoses   Final diagnoses:  None  Right knee pain  New Prescriptions New Prescriptions   No medications on file   I personally performed the services described in this documentation, which was scribed in my presence. The recorded information has been reviewed and is accurate.     Etta Quill, NP 08/22/15 0222    Sherwood Gambler, MD 08/24/15 [phone]

## 2015-08-22 ENCOUNTER — Emergency Department (HOSPITAL_COMMUNITY)
Admission: EM | Admit: 2015-08-22 | Discharge: 2015-08-23 | Disposition: A | Discharge status: Home / Self Care | Payer: Medicare Other | Attending: Emergency Medicine | Admitting: Emergency Medicine

## 2015-08-22 ENCOUNTER — Encounter (HOSPITAL_COMMUNITY): Payer: Self-pay | Admitting: Emergency Medicine

## 2015-08-22 DIAGNOSIS — J45909 Unspecified asthma, uncomplicated: Secondary | ICD-10-CM | POA: Diagnosis not present

## 2015-08-22 DIAGNOSIS — Z853 Personal history of malignant neoplasm of breast: Secondary | ICD-10-CM | POA: Insufficient documentation

## 2015-08-22 DIAGNOSIS — N183 Chronic kidney disease, stage 3 (moderate): Secondary | ICD-10-CM | POA: Insufficient documentation

## 2015-08-22 DIAGNOSIS — I130 Hypertensive heart and chronic kidney disease with heart failure and stage 1 through stage 4 chronic kidney disease, or unspecified chronic kidney disease: Secondary | ICD-10-CM | POA: Diagnosis not present

## 2015-08-22 DIAGNOSIS — M25461 Effusion, right knee: Secondary | ICD-10-CM | POA: Diagnosis not present

## 2015-08-22 DIAGNOSIS — Z7982 Long term (current) use of aspirin: Secondary | ICD-10-CM | POA: Insufficient documentation

## 2015-08-22 DIAGNOSIS — I2510 Atherosclerotic heart disease of native coronary artery without angina pectoris: Secondary | ICD-10-CM | POA: Insufficient documentation

## 2015-08-22 DIAGNOSIS — I5032 Chronic diastolic (congestive) heart failure: Secondary | ICD-10-CM | POA: Insufficient documentation

## 2015-08-22 DIAGNOSIS — M25561 Pain in right knee: Secondary | ICD-10-CM | POA: Diagnosis not present

## 2015-08-22 DIAGNOSIS — Z794 Long term (current) use of insulin: Secondary | ICD-10-CM | POA: Diagnosis not present

## 2015-08-22 DIAGNOSIS — E039 Hypothyroidism, unspecified: Secondary | ICD-10-CM | POA: Diagnosis not present

## 2015-08-22 DIAGNOSIS — I252 Old myocardial infarction: Secondary | ICD-10-CM | POA: Diagnosis not present

## 2015-08-22 DIAGNOSIS — E11319 Type 2 diabetes mellitus with unspecified diabetic retinopathy without macular edema: Secondary | ICD-10-CM | POA: Insufficient documentation

## 2015-08-22 LAB — CBG MONITORING, ED: GLUCOSE-CAPILLARY: 165 mg/dL — AB (ref 65–99)

## 2015-08-22 MED ORDER — LIDOCAINE HCL (PF) 1 % IJ SOLN
10.0000 mL | Freq: Once | INTRAMUSCULAR | Status: AC
  Administered 2015-08-22: 10 mL via INTRADERMAL
  Filled 2015-08-22: qty 10

## 2015-08-22 MED ORDER — OXYCODONE-ACETAMINOPHEN 5-325 MG PO TABS
1.0000 | ORAL_TABLET | Freq: Once | ORAL | Status: AC
  Administered 2015-08-22: 1 via ORAL
  Filled 2015-08-22: qty 1

## 2015-08-22 MED ORDER — FENTANYL CITRATE (PF) 100 MCG/2ML IJ SOLN
50.0000 ug | Freq: Once | INTRAMUSCULAR | Status: AC
  Administered 2015-08-22: 50 ug via INTRAVENOUS
  Filled 2015-08-22: qty 2

## 2015-08-22 NOTE — ED Notes (Signed)
Main Lab called to follow up on pt.'s pending synovial fluid tests results , EDP notified that labs' "system is down " .

## 2015-08-22 NOTE — ED Triage Notes (Signed)
Pt here with worsening right knee pain and swelling. Pt was seen here yesterday and treated for pain only. Pt reports no relief with pain meds and that knee has fever in it. Pt unable to bear weight. Pt denies fever.

## 2015-08-22 NOTE — ED Notes (Signed)
MD [place] at bedside  

## 2015-08-22 NOTE — ED Provider Notes (Signed)
[facility] DEPT Provider Note   CSN: [id number] Arrival date & time: 08/22/15  1337  First Provider Contact:  None       History   Chief Complaint Chief Complaint  Patient presents with  . Knee Pain    HPI Monica Lee is a 65 y.o. female.  HPI  This patient reports continued knee pain since her visit yesterday.  It has been going on 8 days, is moderate, worse with movement and better with rest.  No fevers or chills.  Past Medical History:  Diagnosis Date  . Anemia in chronic kidney disease   . Asthma   . At risk for sleep apnea    STOP-BANG= 4   SENT TO PCP  03-25-2013  . Blind left eye    OCCLUDED CORNEA,  FUNDI--  FAILED SURGERY REPAIR 2003  . 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 Chronic diastolic CHF (congestive heart failure) ([place])    a. Cardiac MRI (10/2012): Moderate to severe LVH, EF 55%, chordal SAM but no LVOT gradient, mod circumferential effusion, mild LAE.  b.  Echocardiogram (11/06/12): Severe LVH, EF 123456, grade 1 diastolic dysfunction, moderate effusion.  . Chronic kidney disease (CKD), stage III (moderate)    NEPHROLOGIST--  DR Freddrick March  . Coronary artery disease CARDIOLOGIST--  DR Johnsie Cancel   NON-OBSTRUCTIVE CAD  PER CARDIAC CATH  2011  . Diabetic retinopathy    RIGHT EYE  . DJD (degenerative joint disease)   . Gastroparesis due to DM ([place])   . GERD (gastroesophageal reflux disease)   . History of CVA (cerebrovascular accident) without residual deficits    2008  AND TIA IN 2008  W/ NO RESIDUAL  . History of kidney stones   . History of myocardial infarction    2011--  S/P CARDIAC CATH ([place])  NON-OBSTRUCTIVE CAD  . History of peptic ulcer    2011--  RESOLVED  . Hx of colonic polyps   . Hyperlipidemia   . Hyperparathyroidism, secondary renal ([place])   . Hypertension   . Hypothyroidism   . Open thigh wound    ANTERIOR  .  Pericardial effusion without cardiac tamponade    PER DR NISHAN  NOTE --  NOT MALIGNANT  ? RELATED TO HYPOTHYROIDISM  . S/P kidney transplant    CADAVERIC --  05/2006  . Thyromegaly    MULTINODULER GOITER  . Trigeminal neuralgia   . 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 Wears glasses     Patient Active Problem List   Diagnosis Date Noted  . Diabetes mellitus following renal transplant ([place]) 03/09/2015  . Gait disorder 01/11/2015  . Diarrhea 04/25/2014  . CHF exacerbation ([place]) 04/24/2014  . Abscess of arm, left 04/24/2014  . Allergic rhinitis 03/30/2014  . Arthritis 03/30/2014  . History of skin graft 06/14/2013  . OSA (obstructive sleep apnea) 04/08/2013  . Traumatic hematoma of thigh 02/14/2013  . Cellulitis and abscess of leg 02/14/2013  . Essential hypertension, benign 11/19/2012  . S/P kidney transplant 11/19/2012  . CKD (chronic kidney disease) 11/19/2012  . Pericardial effusion 11/19/2012  . Hypothyroidism 11/13/2012  . Acute on chronic diastolic heart failure ([place]) 11/11/2012  . Breast cancer of lower-outer quadrant of right female breast ([place]) 10/14/2012  . Olecranon bursitis of right elbow 10/11/2012  . Right shoulder pain 04/09/2012  . Asthma  exacerbation 12/28/2011  . Cellulitis, abdominal wall 12/28/2011  . Hemorrhoids 12/05/2011  . Preventative health care 12/29/2010  . Multinodular goiter 11/17/2010  . Chronic diastolic heart failure ([place]) 11/27/2009  . DIABETIC  RETINOPATHY 11/14/2009  . Elevated lipids 11/14/2009  . Anemia in neoplastic disease 11/14/2009  . INCREASED INTRAOCULAR PRESSURE 11/14/2009  . MYOCARDIAL INFARCTION, HX OF 11/14/2009  . Coronary atherosclerosis 11/14/2009  . Peptic ulcer 11/14/2009  . Gastroparesis 11/14/2009  . LOW BACK PAIN, CHRONIC 11/14/2009  . TRANSIENT ISCHEMIC ATTACK, HX OF 11/14/2009  . COLONIC POLYPS, HX OF 11/14/2009  . Edema 10/03/2009  . Type 2 diabetes mellitus with circulatory disorder ([place])  07/14/2009  . Hypertensive heart disease 07/14/2009  . MYOCARDIAL INFARCTION, ACUTE, SUBENDOCARDIAL 07/14/2009  . CAD, NATIVE VESSEL 07/14/2009  . Asthma 07/14/2009  . GERD 07/14/2009  . NEPHROLITHIASIS, HX OF 07/14/2009  . KIDNEY TRANSPLANTATION 07/14/2009    Past Surgical History:  Procedure Laterality Date  . BREAST BIOPSY  2010  . BREAST LUMPECTOMY WITH SENTINEL LYMPH NODE BIOPSY Right 11/09/2012   Procedure: RIGHT BREAST LUMPECTOMY WITH SENTINEL LYMPH NODE REMOVAL;  Surgeon: Haywood Lasso, MD;  Location: [facility];  Service: General;  Laterality: Right;  . CARDIAC CATHETERIZATION  04-12-2009  DR EVELYNE GOUDREAU ([facility] IN [place], [place])   NON-OBSTRUCTIVE CAD/  mLAD 40%,  oLAD 50%,  dCFX 40%,  OM1  40%,  mRCA  &  dRCA  50%/  LVEF 65% /  ELEVATED  LVEDP  . CARDIOVASCULAR STRESS TEST  11-04-2012  DR NISHAN   HIGH RISK NUCLEAR STUDY/  FIXED DEFECT AFFECTING ENTIRE INFEROLATERAL AND ANTEROLATERAL WALL/  MODERATE ISCHEMIA  AT MID/ APICAL ANTEROR AND INFERIOR WALL/  GLOBAL HYPOKINESIS/  LVEF 28% (FELT TO BE FALSE +,  ECHO & cMRI  NORMAL EF AND NORMAL WM)  . CATARACT EXTRACTION W/ INTRAOCULAR LENS  IMPLANT, BILATERAL    . CHOLECYSTECTOMY  2008  . EYE SURGERY Left 2003   REPAIR CORNEA  . FOOT SURGERY Right 2013  . HEMORRHOID SURGERY  01/23/2012   Procedure: HEMORRHOIDECTOMY PROLAPSED;  Surgeon: Leighton Ruff, MD;  Location[facility];  Service: General;  Laterality: N/A;  . INCISION AND DRAINAGE OF WOUND Left 02/18/2013   Procedure: IRRIGATION AND DEBRIDEMENT THIGH WOUND;  Surgeon: Rolm Bookbinder, MD;  Location: [facility];  Service: General;  Laterality: Left;  . IRRIGATION AND DEBRIDEMENT ABSCESS Left 02/14/2013   Procedure: IRRIGATION AND DEBRIDEMENT LEFT THIGH ABSCESS;  Surgeon: Joyice Faster. Cornett, MD;  Location: [facility];  Service: General;  Laterality: Left;  . PORTACATH PLACEMENT Right 11/30/2012   Procedure: INSERTION PORT-A-CATH;  Surgeon: Haywood Lasso, MD;  Location:  [facility];  Service: General;  Laterality: Right;  . REPAIR  INTESTINAL PERFORATION SURGERY  01-31-2009  . SKIN SPLIT GRAFT Left 03/29/2013   Procedure: SKIN GRAFT SPLIT THICKNESS WITH A-CELL AND VAC TO LEFT THIGH;  Surgeon: Theodoro Kos, DO;  Location: [facility];  Service: Plastics;  Laterality: Left;  . TRANSPLANTATION RENAL  05/2006   CADAVERIC  . TRANSTHORACIC ECHOCARDIOGRAM  01-19-2013  DR NISHAN   SEVERE LVH/  EF [id number]  GRADE I DIASTOLIC DYSFUNCTION/  MODERATE LAE/  MILD IMPROVEMENT OF MODERATE CIRCUMFERENTIAL INFERIOROLATERAL PERICARDIAL EFFUSION WITH NO TAMPONADE  . VAGINAL HYSTERECTOMY  1990's   W/  BILATERAL SALPINGOOPHORECTOMY    OB History    No data available       Home Medications    Prior to Admission medications   Medication Sig  Start Date End Date Taking? Authorizing Provider  albuterol (ACCUNEB) 0.63 MG/3ML nebulizer solution Take 1 ampule by nebulization every 6 (six) hours as needed for wheezing.    Historical Provider, MD  albuterol (PROVENTIL HFA;VENTOLIN HFA) 108 (90 BASE) MCG/ACT inhaler Inhale 2 puffs into the lungs every 6 (six) hours as needed for wheezing or shortness of breath. 01/04/14   Biagio Borg, MD  amLODipine (NORVASC) 5 MG tablet Take 5 mg by mouth every evening.     Historical Provider, MD  aspirin EC 81 MG tablet Take 81 mg by mouth daily.    Historical Provider, MD  azaTHIOprine (IMURAN) 50 MG tablet Take 75 mg by mouth daily.     Historical Provider, MD  brimonidine (ALPHAGAN) 0.2 % ophthalmic solution Place 1 drop into the right eye 2 (two) times daily.  03/10/13   Historical Provider, MD  calcitRIOL (ROCALTROL) 0.25 MCG capsule TK 1 C PO D 12/29/14   Historical Provider, MD  docusate sodium (COLACE) 50 MG capsule Take 200 mg by mouth 2 (two) times daily.    Historical Provider, MD  feeding supplement, GLUCERNA SHAKE, (GLUCERNA SHAKE) LIQD Take 237 mLs by mouth 2 (two) times daily between meals. 02/23/13   Hosie Poisson, MD  furosemide (LASIX) 80 MG tablet Take 160 mg by mouth 2 (two) times daily.     Historical Provider, MD  insulin NPH-regular Human (NOVOLIN 70/30) (70-30) 100 UNIT/ML injection 15-40 units inject under skin twice a day 08/25/14   Philemon Kingdom, MD  Insulin Syringe-Needle U-100 (INSULIN SYRINGE .5CC/31GX5/16") 31G X 5/16" 0.5 ML MISC USE TO INJECT INSULIN 5 TIMES DAILY AS INSTRUCTED 04/10/15   Philemon Kingdom, MD  isosorbide mononitrate (IMDUR) 60 MG 24 hr tablet Take 1 tablet (60 mg total) by mouth daily. 11/25/14   Biagio Borg, MD  KLOR-CON M20 20 MEQ tablet Take 10 mEq by mouth 2 (two) times daily. Takes 1/2 tab in morning and 1/2 at night. 02/11/13   Historical Provider, MD  levothyroxine (SYNTHROID, LEVOTHROID) 75 MCG tablet Take 1 tablet (75 mcg total) by mouth daily. 03/25/13   Biagio Borg, MD  magnesium oxide (MAG-OX) 400 MG tablet Take 400 mg by mouth 2 (two) times daily.    Historical Provider, MD  metoprolol (LOPRESSOR) 100 MG tablet TAKE 1 TABLET BY MOUTH TWICE DAILY 08/10/14   Josue Hector, MD  minoxidil (LONITEN) 2.5 MG tablet TAKE 1 TABLET BY MOUTH TWICE DAILY 06/05/15   Biagio Borg, MD  nitroGLYCERIN (NITROSTAT) 0.4 MG SL tablet Place 1 tablet (0.4 mg total) under the tongue every 5 (five) minutes as needed for chest pain. 02/24/15   Josue Hector, MD  polyethylene glycol powder [facility]) powder MIX 17GM AS DIRECTED DAILY 01/30/15   Biagio Borg, MD  polyethylene glycol powder [facility]) powder Mix 17g in juice/water daily as needed Overdue for yearly physical/labs must see MD for refills 08/17/15   Biagio Borg, MD  pravastatin (PRAVACHOL) 20 MG tablet Take 10 mg by mouth at bedtime.      Historical Provider, MD  prednisoLONE acetate (PRED FORTE) 1 % ophthalmic suspension Place 1 drop into the left eye 2 (two) times daily.     Historical Provider, MD  predniSONE (DELTASONE) 10 MG tablet TAKE 1 TABLET(10 MG) BY MOUTH EVERY MORNING 07/26/15   Biagio Borg, MD    tacrolimus (PROGRAF) 1 MG capsule Take 9 mg by mouth 2 (two) times daily.     Historical Provider,  MD  timolol (TIMOPTIC) 0.25 % ophthalmic solution Place 1 drop into the right eye 2 (two) times daily.    Historical Provider, MD  traMADol (ULTRAM) 50 MG tablet Take 1 tablet (50 mg total) by mouth every 6 (six) hours as needed. 08/21/15   Etta Quill, NP    Family History Family History  Problem Relation Age of Onset  . Heart disease Father   . Arthritis Other     parent  . Heart disease Other     parent  . Hypertension Other     parent  . Diabetes Other     parent  . Diabetes Other     grandparent  . Thyroid disease Other     several  . Kidney disease Neg Hx     Social History Social History  Substance Use Topics  . Smoking status: Never Smoker  . Smokeless tobacco: Never Used  . Alcohol use No     Allergies   2,4-d dimethylamine (amisol); Keflex [cephalexin]; Propoxyphene n-acetaminophen; and Vancomycin   Review of Systems Review of Systems  Constitutional: Negative for chills and fever.  HENT: Negative for ear pain and sore throat.   Eyes: Negative for pain and visual disturbance.  Respiratory: Negative for cough and shortness of breath.   Cardiovascular: Negative for chest pain and palpitations.  Gastrointestinal: Negative for abdominal pain and vomiting.  Genitourinary: Negative for dysuria and hematuria.  Musculoskeletal: Positive for arthralgias. Negative for back pain.  Skin: Negative for color change and rash.  Neurological: Negative for seizures and syncope.  All other systems reviewed and are negative.    Physical Exam Updated Vital Signs BP 125/61 (BP Location: Right Arm)   Pulse (!) 54   Temp 98.4 F (36.9 C) (Oral)   Resp 20   Ht 5\' 5"  (1.651 m)   Wt 85.3 kg   SpO2 100%   BMI 31.28 kg/m   Physical Exam  Constitutional: She appears well-developed and well-nourished. No distress.  HENT:  Head: Normocephalic and atraumatic.  Eyes:  Conjunctivae are normal.  Neck: Neck supple.  Cardiovascular: Normal rate and regular rhythm.   No murmur heard. Pulmonary/Chest: Effort normal and breath sounds normal. No respiratory distress.  Abdominal: Soft. There is no tenderness.  Musculoskeletal: She exhibits no edema.  R knee: Inspection: no deformity, moderate effusion ROM: full, with pain Strength: 5/5 in flexion and 5/5 in extension Pulses: distal pulses intact Sensation: distal sensation intact   Neurological: She is alert.  Skin: Skin is warm and dry.  Psychiatric: She has a normal mood and affect.  Nursing note and vitals reviewed.    ED Treatments / Results  Labs (all labs ordered are listed, but only abnormal results are displayed) Labs Reviewed - No data to display  EKG  EKG Interpretation None       Radiology Dg Knee Complete 4 Views Right  Result Date: 08/21/2015 CLINICAL DATA:  Right patella pain x 1 week. This region feels warm today per patient. No injury. Hx of gout. EXAM: RIGHT KNEE - COMPLETE 4+ VIEW COMPARISON:  01/28/2014 FINDINGS: Enthesophyte at the upper pole of the patella, stable in appearance. No significant joint effusion. No acute fracture or subluxation. Extensive vascular calcifications are noted. IMPRESSION: No evidence for acute  abnormality. Electronically Signed   By: Nolon Nations M.D.   On: 08/21/2015 19:38    Procedures .Joint Aspiration/Arthrocentesis Date/Time: 08/23/2015 1:38 AM Performed by: Levada Schilling Authorized by: Levada Schilling   Consent:  Consent obtained:  Written   Consent given by:  Patient   Risks discussed:  Bleeding, pain and infection   Alternatives discussed:  No treatment Location:    Location:  Knee   Knee:  R knee Anesthesia (see MAR for exact dosages):    Anesthesia method:  Local infiltration   Local anesthetic:  Lidocaine 1% w/o epi Procedure details:    Preparation: Patient was prepped and draped in usual sterile fashion     Needle  gauge:  18 G   Ultrasound guidance: no     Approach:  Lateral   Aspirate characteristics:  Clear   Steroid injected: no   Post-procedure details:    Dressing:  Adhesive bandage   Patient tolerance of procedure:  Tolerated well, no immediate complications   (including critical care time)  Medications Ordered in ED Medications - No data to display   Initial Impression / Assessment and Plan / ED Course  I have reviewed the triage vital signs and the nursing notes.  Pertinent labs & imaging results that were available during my care of the patient were reviewed by me and considered in my medical decision making (see chart for details).  Clinical Course    Patient returns for knee pain. On exam, she has a mild effusion. She is ambulatory. Given over all well appearance, and ROM, would doubt septic joint. Consent obtained and arthrocentisis performed. There was an issue with the lab processing this, so results were significantly delayed. Results returned with no crystals and minimal WBCs, so doubt gout or septic joint. Will recommend symptomatic care for OA pain and f/u with PCP.   Final Clinical Impressions(s) / ED Diagnoses   Final diagnoses:  None    New Prescriptions New Prescriptions   No medications on file     Levada Schilling, MD 08/23/15 0139    Sherwood Gambler, MD 08/24/15 [phone]

## 2015-08-22 NOTE — ED Notes (Signed)
Supplies for arthrocentesis placed at bedside, per MD's request.

## 2015-08-22 NOTE — ED Notes (Signed)
EDP advised nurse that pt. can not eat at this time .

## 2015-08-23 LAB — SYNOVIAL CELL COUNT + DIFF, W/ CRYSTALS
CRYSTALS FLUID: NONE SEEN
Eosinophils-Synovial: 0 % (ref 0–1)
Lymphocytes-Synovial Fld: 22 % — ABNORMAL HIGH (ref 0–20)
Monocyte-Macrophage-Synovial Fluid: 74 % (ref 50–90)
NEUTROPHIL, SYNOVIAL: 4 % (ref 0–25)
WBC, SYNOVIAL: 32 /mm3 (ref 0–200)

## 2015-08-23 LAB — MISC LABCORP TEST (SEND OUT): Labcorp test code: 19497

## 2015-08-24 ENCOUNTER — Telehealth: Payer: Self-pay

## 2015-08-24 NOTE — Telephone Encounter (Signed)
Call back on [phone] - Drue Dun

## 2015-08-24 NOTE — Telephone Encounter (Signed)
Patient called and said that they needed a hospital FU today or tomorrow. They have already been to ER this week twice. And keep being told to follow up with PCP. You do not have nothing open for this until maybe Tuesday. PLease advise.

## 2015-08-25 LAB — BODY FLUID CULTURE: Culture: NO GROWTH

## 2015-08-25 NOTE — Telephone Encounter (Signed)
Called and spoke to patients daughter, patient is requesting a cortisone shot in her leg, states that she was in the hospital on Monday and Tuesday of this week and they would not give her anything. I advised the patients daughter that we could get her in Tuesday 08.15.2017 at the earliest or she could go to an urgent care or call back after lunch for the Saturday clinic. Patients daughter got upset and hung up the phone.

## 2015-08-26 ENCOUNTER — Other Ambulatory Visit: Payer: Self-pay | Admitting: Cardiovascular Disease

## 2015-08-26 ENCOUNTER — Other Ambulatory Visit: Payer: Self-pay | Admitting: Internal Medicine

## 2015-08-26 DIAGNOSIS — I509 Heart failure, unspecified: Secondary | ICD-10-CM | POA: Diagnosis not present

## 2015-08-26 DIAGNOSIS — N186 End stage renal disease: Secondary | ICD-10-CM | POA: Diagnosis not present

## 2015-08-26 DIAGNOSIS — I2510 Atherosclerotic heart disease of native coronary artery without angina pectoris: Secondary | ICD-10-CM | POA: Diagnosis not present

## 2015-08-26 DIAGNOSIS — L02211 Cutaneous abscess of abdominal wall: Secondary | ICD-10-CM | POA: Diagnosis not present

## 2015-08-26 DIAGNOSIS — I132 Hypertensive heart and chronic kidney disease with heart failure and with stage 5 chronic kidney disease, or end stage renal disease: Secondary | ICD-10-CM | POA: Diagnosis not present

## 2015-08-26 DIAGNOSIS — E1122 Type 2 diabetes mellitus with diabetic chronic kidney disease: Secondary | ICD-10-CM | POA: Diagnosis not present

## 2015-08-26 LAB — MISC LABCORP TEST (SEND OUT): LABCORP TEST CODE: 19588

## 2015-08-29 ENCOUNTER — Telehealth: Payer: Self-pay | Admitting: Emergency Medicine

## 2015-08-29 ENCOUNTER — Inpatient Hospital Stay: Payer: Medicare Other | Admitting: Internal Medicine

## 2015-08-29 NOTE — Telephone Encounter (Signed)
Pt missed her hospital follow up appointment today for leg painful and red. Would you like me to call her to reschedule?

## 2015-08-29 NOTE — Telephone Encounter (Signed)
Chart review shows pt had knee aspiration done at ER aug 8  If now has fever, and knee swelling/pain she should go to ER now  Otherwise if no fever but red/swelling to the knee persists, she might consider seeing Dr Tamala Julian in this office   If none of this seems to apply, OK to make ROV with me

## 2015-08-30 DIAGNOSIS — E1122 Type 2 diabetes mellitus with diabetic chronic kidney disease: Secondary | ICD-10-CM | POA: Diagnosis not present

## 2015-08-30 DIAGNOSIS — I2510 Atherosclerotic heart disease of native coronary artery without angina pectoris: Secondary | ICD-10-CM | POA: Diagnosis not present

## 2015-08-30 DIAGNOSIS — L02211 Cutaneous abscess of abdominal wall: Secondary | ICD-10-CM | POA: Diagnosis not present

## 2015-08-30 DIAGNOSIS — N186 End stage renal disease: Secondary | ICD-10-CM | POA: Diagnosis not present

## 2015-08-30 DIAGNOSIS — I132 Hypertensive heart and chronic kidney disease with heart failure and with stage 5 chronic kidney disease, or end stage renal disease: Secondary | ICD-10-CM | POA: Diagnosis not present

## 2015-08-30 DIAGNOSIS — I509 Heart failure, unspecified: Secondary | ICD-10-CM | POA: Diagnosis not present

## 2015-08-30 NOTE — Telephone Encounter (Signed)
Spoke with patient. She states she would like to make an office visit but will call back with a time. Thanks.

## 2015-08-31 DIAGNOSIS — M25561 Pain in right knee: Secondary | ICD-10-CM | POA: Diagnosis not present

## 2015-09-06 ENCOUNTER — Other Ambulatory Visit: Payer: Self-pay | Admitting: Internal Medicine

## 2015-09-25 DIAGNOSIS — I129 Hypertensive chronic kidney disease with stage 1 through stage 4 chronic kidney disease, or unspecified chronic kidney disease: Secondary | ICD-10-CM | POA: Diagnosis not present

## 2015-09-25 DIAGNOSIS — N183 Chronic kidney disease, stage 3 (moderate): Secondary | ICD-10-CM | POA: Diagnosis not present

## 2015-09-25 DIAGNOSIS — M109 Gout, unspecified: Secondary | ICD-10-CM | POA: Diagnosis not present

## 2015-09-25 DIAGNOSIS — N2581 Secondary hyperparathyroidism of renal origin: Secondary | ICD-10-CM | POA: Diagnosis not present

## 2015-09-25 DIAGNOSIS — E11319 Type 2 diabetes mellitus with unspecified diabetic retinopathy without macular edema: Secondary | ICD-10-CM | POA: Diagnosis not present

## 2015-09-25 DIAGNOSIS — Z940 Kidney transplant status: Secondary | ICD-10-CM | POA: Diagnosis not present

## 2015-09-25 DIAGNOSIS — E118 Type 2 diabetes mellitus with unspecified complications: Secondary | ICD-10-CM | POA: Diagnosis not present

## 2015-09-25 DIAGNOSIS — E8770 Fluid overload, unspecified: Secondary | ICD-10-CM | POA: Diagnosis not present

## 2015-10-02 ENCOUNTER — Other Ambulatory Visit: Payer: Self-pay | Admitting: Internal Medicine

## 2015-10-02 DIAGNOSIS — L02211 Cutaneous abscess of abdominal wall: Secondary | ICD-10-CM | POA: Diagnosis not present

## 2015-10-04 DIAGNOSIS — N183 Chronic kidney disease, stage 3 (moderate): Secondary | ICD-10-CM | POA: Diagnosis not present

## 2015-10-10 ENCOUNTER — Other Ambulatory Visit: Payer: Self-pay | Admitting: Internal Medicine

## 2015-10-20 ENCOUNTER — Emergency Department (HOSPITAL_COMMUNITY)
Admission: EM | Admit: 2015-10-20 | Discharge: 2015-10-21 | Disposition: A | Discharge status: Home / Self Care | Payer: Medicare Other | Attending: Emergency Medicine | Admitting: Emergency Medicine

## 2015-10-20 ENCOUNTER — Encounter (HOSPITAL_COMMUNITY): Payer: Self-pay | Admitting: *Deleted

## 2015-10-20 ENCOUNTER — Emergency Department (HOSPITAL_COMMUNITY): Payer: Medicare Other

## 2015-10-20 DIAGNOSIS — I2510 Atherosclerotic heart disease of native coronary artery without angina pectoris: Secondary | ICD-10-CM | POA: Diagnosis not present

## 2015-10-20 DIAGNOSIS — Z853 Personal history of malignant neoplasm of breast: Secondary | ICD-10-CM | POA: Diagnosis not present

## 2015-10-20 DIAGNOSIS — I5032 Chronic diastolic (congestive) heart failure: Secondary | ICD-10-CM | POA: Insufficient documentation

## 2015-10-20 DIAGNOSIS — R51 Headache: Secondary | ICD-10-CM | POA: Insufficient documentation

## 2015-10-20 DIAGNOSIS — E119 Type 2 diabetes mellitus without complications: Secondary | ICD-10-CM | POA: Diagnosis not present

## 2015-10-20 DIAGNOSIS — N183 Chronic kidney disease, stage 3 (moderate): Secondary | ICD-10-CM | POA: Diagnosis not present

## 2015-10-20 DIAGNOSIS — I130 Hypertensive heart and chronic kidney disease with heart failure and stage 1 through stage 4 chronic kidney disease, or unspecified chronic kidney disease: Secondary | ICD-10-CM | POA: Diagnosis not present

## 2015-10-20 DIAGNOSIS — E039 Hypothyroidism, unspecified: Secondary | ICD-10-CM | POA: Diagnosis not present

## 2015-10-20 DIAGNOSIS — R519 Headache, unspecified: Secondary | ICD-10-CM

## 2015-10-20 DIAGNOSIS — Z8673 Personal history of transient ischemic attack (TIA), and cerebral infarction without residual deficits: Secondary | ICD-10-CM | POA: Insufficient documentation

## 2015-10-20 DIAGNOSIS — J45909 Unspecified asthma, uncomplicated: Secondary | ICD-10-CM | POA: Insufficient documentation

## 2015-10-20 DIAGNOSIS — I252 Old myocardial infarction: Secondary | ICD-10-CM | POA: Insufficient documentation

## 2015-10-20 LAB — CBC WITH DIFFERENTIAL/PLATELET
Basophils Absolute: 0 10*3/uL (ref 0.0–0.1)
Basophils Relative: 0 %
EOS ABS: 0 10*3/uL (ref 0.0–0.7)
EOS PCT: 0 %
HCT: 42.5 % (ref 36.0–46.0)
Hemoglobin: 13.2 g/dL (ref 12.0–15.0)
LYMPHS ABS: 0.8 10*3/uL (ref 0.7–4.0)
LYMPHS PCT: 8 %
MCH: 27.9 pg (ref 26.0–34.0)
MCHC: 31.1 g/dL (ref 30.0–36.0)
MCV: 89.9 fL (ref 78.0–100.0)
MONO ABS: 0.4 10*3/uL (ref 0.1–1.0)
Monocytes Relative: 4 %
Neutro Abs: 8.8 10*3/uL — ABNORMAL HIGH (ref 1.7–7.7)
Neutrophils Relative %: 88 %
PLATELETS: 204 10*3/uL (ref 150–400)
RBC: 4.73 MIL/uL (ref 3.87–5.11)
RDW: 15.6 % — AB (ref 11.5–15.5)
WBC: 10 10*3/uL (ref 4.0–10.5)

## 2015-10-20 LAB — BASIC METABOLIC PANEL
Anion gap: 8 (ref 5–15)
BUN: 17 mg/dL (ref 6–20)
CO2: 27 mmol/L (ref 22–32)
CREATININE: 1.01 mg/dL — AB (ref 0.44–1.00)
Calcium: 9.7 mg/dL (ref 8.9–10.3)
Chloride: 101 mmol/L (ref 101–111)
GFR calc Af Amer: >60 mL/min (ref >60)
GFR calc non Af Amer: 58 mL/min — ABNORMAL LOW (ref >60)
GLUCOSE: 138 mg/dL — AB (ref 65–99)
POTASSIUM: 4.5 mmol/L (ref 3.5–5.1)
SODIUM: 136 mmol/L (ref 135–145)

## 2015-10-20 MED ORDER — DIPHENHYDRAMINE HCL 50 MG/ML IJ SOLN
25.0000 mg | Freq: Once | INTRAMUSCULAR | Status: AC
  Administered 2015-10-20: 25 mg via INTRAVENOUS
  Filled 2015-10-20: qty 1

## 2015-10-20 MED ORDER — ACETAMINOPHEN 325 MG PO TABS
ORAL_TABLET | ORAL | Status: AC
  Administered 2015-10-20: 650 mg via ORAL
  Filled 2015-10-20: qty 2

## 2015-10-20 MED ORDER — ONDANSETRON 4 MG PO TBDP
ORAL_TABLET | ORAL | Status: AC
  Filled 2015-10-20: qty 1

## 2015-10-20 MED ORDER — ACETAMINOPHEN 325 MG PO TABS
650.0000 mg | ORAL_TABLET | ORAL | Status: AC
  Administered 2015-10-20: 650 mg via ORAL

## 2015-10-20 MED ORDER — METOCLOPRAMIDE HCL 5 MG/ML IJ SOLN
10.0000 mg | Freq: Once | INTRAMUSCULAR | Status: AC
  Administered 2015-10-20: 10 mg via INTRAVENOUS
  Filled 2015-10-20: qty 2

## 2015-10-20 MED ORDER — ONDANSETRON 4 MG PO TBDP
4.0000 mg | ORAL_TABLET | Freq: Once | ORAL | Status: AC
  Administered 2015-10-20: 4 mg via ORAL

## 2015-10-20 MED ORDER — SODIUM CHLORIDE 0.9 % IV SOLN
INTRAVENOUS | Status: DC
  Administered 2015-10-20: 23:00:00 via INTRAVENOUS

## 2015-10-20 NOTE — ED Triage Notes (Signed)
Pt reports having a headache since last night with n/v. Hx of migraines. Denies sensitivity to light.

## 2015-10-20 NOTE — ED Notes (Signed)
Pt experiencing nausea and chills.  Actively vomiting.  Zofran provided, temperature rechecked.

## 2015-10-20 NOTE — ED Provider Notes (Signed)
[facility] DEPT Provider Note   CSN: [id number] Arrival date & time: 10/20/15  [id number]     History   Chief Complaint Chief Complaint  Patient presents with  . Headache    HPI Monica Lee is a 65 y.o. female.  HPI   Patient has PMH of asthma, migraines, CHF, CKD,  Breast cancer, hypertension, diabetes, and multiple other medical problems.  Patient comes in for headache since last night. She has had associated nausea and vomiting. The patient does have a history of migraines but cannot verbalize if this feels similar or not. Her pain is the right right side of her face, frontal lobe, temple, and parietal region and described as a pressure. Per the husband he does not believe he has ever seen her have such a severe headache. It started yesterday evening. She denies having had fever, she has also been nauseous and had vomiting. She has not had any neck pain, CP, SOB,   Past Medical History:  Diagnosis Date  . Anemia in chronic kidney disease   . Asthma   . At risk for sleep apnea    STOP-BANG= 4   SENT TO PCP  03-25-2013  . Blind left eye    OCCLUDED CORNEA,  FUNDI--  FAILED SURGERY REPAIR 2003  . 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 Chronic diastolic CHF (congestive heart failure) ([place])    a. Cardiac MRI (10/2012): Moderate to severe LVH, EF 55%, chordal SAM but no LVOT gradient, mod circumferential effusion, mild LAE.  b.  Echocardiogram (11/06/12): Severe LVH, EF 123456, grade 1 diastolic dysfunction, moderate effusion.  . Chronic kidney disease (CKD), stage III (moderate)    NEPHROLOGIST--  DR Freddrick March  . Coronary artery disease CARDIOLOGIST--  DR Johnsie Cancel   NON-OBSTRUCTIVE CAD  PER CARDIAC CATH  2011  . Diabetic retinopathy    RIGHT EYE  . DJD (degenerative joint disease)   . Gastroparesis due to DM ([place])   . GERD (gastroesophageal reflux disease)   . History of  CVA (cerebrovascular accident) without residual deficits    2008  AND TIA IN 2008  W/ NO RESIDUAL  . History of kidney stones   . History of myocardial infarction    2011--  S/P CARDIAC CATH ([place])  NON-OBSTRUCTIVE CAD  . History of peptic ulcer    2011--  RESOLVED  . Hx of colonic polyps   . Hyperlipidemia   . Hyperparathyroidism, secondary renal ([place])   . Hypertension   . Hypothyroidism   . Open thigh wound    ANTERIOR  . Pericardial effusion without cardiac tamponade    PER DR NISHAN  NOTE --  NOT MALIGNANT  ? RELATED TO HYPOTHYROIDISM  . S/P kidney transplant    CADAVERIC --  05/2006  . Thyromegaly    MULTINODULER GOITER  . Trigeminal neuralgia   . 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 Wears glasses     Patient Active Problem List   Diagnosis Date Noted  . Diabetes mellitus following renal transplant ([place]) 03/09/2015  . Gait disorder 01/11/2015  . Diarrhea 04/25/2014  . CHF exacerbation ([place]) 04/24/2014  . Abscess of arm, left 04/24/2014  . Allergic rhinitis 03/30/2014  . Arthritis 03/30/2014  . History of skin graft 06/14/2013  . OSA (obstructive sleep apnea) 04/08/2013  . Traumatic hematoma of thigh 02/14/2013  .  Cellulitis and abscess of leg 02/14/2013  . Essential hypertension, benign 11/19/2012  . S/P kidney transplant 11/19/2012  . CKD (chronic kidney disease) 11/19/2012  . Pericardial effusion 11/19/2012  . Hypothyroidism 11/13/2012  . Acute on chronic diastolic heart failure ([place]) 11/11/2012  . Breast cancer of lower-outer quadrant of right female breast ([place]) 10/14/2012  . Olecranon bursitis of right elbow 10/11/2012  . Right shoulder pain 04/09/2012  . Asthma exacerbation 12/28/2011  . Cellulitis, abdominal wall 12/28/2011  . Hemorrhoids 12/05/2011  . Preventative health care 12/29/2010  . Multinodular goiter 11/17/2010  . Chronic diastolic heart failure ([place]) 11/27/2009  . DIABETIC  RETINOPATHY 11/14/2009  . Elevated  lipids 11/14/2009  . Anemia in neoplastic disease 11/14/2009  . INCREASED INTRAOCULAR PRESSURE 11/14/2009  . MYOCARDIAL INFARCTION, HX OF 11/14/2009  . Coronary atherosclerosis 11/14/2009  . Peptic ulcer 11/14/2009  . Gastroparesis 11/14/2009  . LOW BACK PAIN, CHRONIC 11/14/2009  . TRANSIENT ISCHEMIC ATTACK, HX OF 11/14/2009  . COLONIC POLYPS, HX OF 11/14/2009  . Edema 10/03/2009  . Type 2 diabetes mellitus with circulatory disorder ([place]) 07/14/2009  . Hypertensive heart disease 07/14/2009  . MYOCARDIAL INFARCTION, ACUTE, SUBENDOCARDIAL 07/14/2009  . CAD, NATIVE VESSEL 07/14/2009  . Asthma 07/14/2009  . GERD 07/14/2009  . NEPHROLITHIASIS, HX OF 07/14/2009  . KIDNEY TRANSPLANTATION 07/14/2009    Past Surgical History:  Procedure Laterality Date  . BREAST BIOPSY  2010  . BREAST LUMPECTOMY WITH SENTINEL LYMPH NODE BIOPSY Right 11/09/2012   Procedure: RIGHT BREAST LUMPECTOMY WITH SENTINEL LYMPH NODE REMOVAL;  Surgeon: Haywood Lasso, MD;  Location: [facility];  Service: General;  Laterality: Right;  . CARDIAC CATHETERIZATION  04-12-2009  DR EVELYNE GOUDREAU ([facility] IN [place], [place])   NON-OBSTRUCTIVE CAD/  mLAD 40%,  oLAD 50%,  dCFX 40%,  OM1  40%,  mRCA  &  dRCA  50%/  LVEF 65% /  ELEVATED  LVEDP  . CARDIOVASCULAR STRESS TEST  11-04-2012  DR NISHAN   HIGH RISK NUCLEAR STUDY/  FIXED DEFECT AFFECTING ENTIRE INFEROLATERAL AND ANTEROLATERAL WALL/  MODERATE ISCHEMIA  AT MID/ APICAL ANTEROR AND INFERIOR WALL/  GLOBAL HYPOKINESIS/  LVEF 28% (FELT TO BE FALSE +,  ECHO & cMRI  NORMAL EF AND NORMAL WM)  . CATARACT EXTRACTION W/ INTRAOCULAR LENS  IMPLANT, BILATERAL    . CHOLECYSTECTOMY  2008  . EYE SURGERY Left 2003   REPAIR CORNEA  . FOOT SURGERY Right 2013  . HEMORRHOID SURGERY  01/23/2012   Procedure: HEMORRHOIDECTOMY PROLAPSED;  Surgeon: Leighton Ruff, MD;  Location[facility];  Service: General;  Laterality: N/A;  . INCISION AND DRAINAGE OF WOUND Left 02/18/2013   Procedure:  IRRIGATION AND DEBRIDEMENT THIGH WOUND;  Surgeon: Rolm Bookbinder, MD;  Location: [facility];  Service: General;  Laterality: Left;  . IRRIGATION AND DEBRIDEMENT ABSCESS Left 02/14/2013   Procedure: IRRIGATION AND DEBRIDEMENT LEFT THIGH ABSCESS;  Surgeon: Joyice Faster. Cornett, MD;  Location: [facility];  Service: General;  Laterality: Left;  . PORTACATH PLACEMENT Right 11/30/2012   Procedure: INSERTION PORT-A-CATH;  Surgeon: Haywood Lasso, MD;  Location: [facility];  Service: General;  Laterality: Right;  . REPAIR  INTESTINAL PERFORATION SURGERY  01-31-2009  . SKIN SPLIT GRAFT Left 03/29/2013   Procedure: SKIN GRAFT SPLIT THICKNESS WITH A-CELL AND VAC TO LEFT THIGH;  Surgeon: Theodoro Kos, DO;  Location: [facility];  Service: Plastics;  Laterality: Left;  . TRANSPLANTATION RENAL  05/2006   CADAVERIC  . TRANSTHORACIC ECHOCARDIOGRAM  01-19-2013  DR Lutsen  EF [id number]  GRADE I DIASTOLIC DYSFUNCTION/  MODERATE LAE/  MILD IMPROVEMENT OF MODERATE CIRCUMFERENTIAL INFERIOROLATERAL PERICARDIAL EFFUSION WITH NO TAMPONADE  . VAGINAL HYSTERECTOMY  1990's   W/  BILATERAL SALPINGOOPHORECTOMY    OB History    No data available       Home Medications      Family History Family History  Problem Relation Age of Onset  . Heart disease Father   . Arthritis Other     parent  . Heart disease Other     parent  . Hypertension Other     parent  . Diabetes Other     parent  . Diabetes Other     grandparent  . Thyroid disease Other     several  . Kidney disease Neg Hx     Social History Social History  Substance Use Topics  . Smoking status: Never Smoker  . Smokeless tobacco: Never Used  . Alcohol use No     Allergies   2,4-d dimethylamine (amisol); Keflex [cephalexin]; Propoxyphene n-acetaminophen; and Vancomycin   Review of Systems Review of Systems  Review of Systems All other systems negative except as documented in the HPI. All pertinent  positives and negatives as reviewed in the HPI.   Physical Exam Updated Vital Signs BP (!) 105/44   Pulse 87   Temp 100.9 F (38.3 C)   Resp 22   SpO2 93%   Physical Exam  Constitutional: She appears well-developed and well-nourished. No distress.  HENT:  Head: Normocephalic and atraumatic.  Right Ear: Tympanic membrane and ear canal normal.  Left Ear: Tympanic membrane and ear canal normal.  Nose: Nose normal.  Mouth/Throat: Uvula is midline, oropharynx is clear and moist and mucous membranes are normal.  Eyes: Pupils are equal, round, and reactive to light.  Neck: Trachea normal and normal range of motion. Neck supple. No Brudzinski's sign and no Kernig's sign noted.  Cardiovascular: Normal rate and regular rhythm.   Pulmonary/Chest: Effort normal.  Abdominal: Soft.  No signs of abdominal distention  Musculoskeletal:  No LE swelling  Neurological: She is alert.  Blind in left eye at baseline Cranial nerves grossly intact on exam. Pt alert and oriented x 3 Upper and lower extremity strength is symmetrical and physiologic Normal muscular tone No facial droop Coordination intact, no limb ataxia,No pronator drift  Skin: Skin is warm and dry. No rash noted.  Nursing note and vitals reviewed.   ED Treatments / Results  Labs (all labs ordered are listed, but only abnormal results are displayed) Labs Reviewed  CBC WITH DIFFERENTIAL/PLATELET - Abnormal; Notable for the following:       Result Value   RDW 15.6 (*)    Neutro Abs 8.8 (*)    All other components within normal limits  BASIC METABOLIC PANEL - Abnormal; Notable for the following:    Glucose, Bld 138 (*)    Creatinine, Ser 1.01 (*)    GFR calc non Af Amer 58 (*)    All other components within normal limits    EKG  EKG Interpretation None       Radiology Ct Head Wo Contrast  Result Date: 10/21/2015 CLINICAL DATA:  Severe right headache since last night. Nausea and vomiting. EXAM: CT HEAD WITHOUT  CONTRAST TECHNIQUE: Contiguous axial images were obtained from the base of the skull through the vertex without intravenous contrast. COMPARISON:  MRI brain 09/04/2010.  CT head 09/03/2010. FINDINGS: Brain:  No evidence of acute infarction, hemorrhage, hydrocephalus, extra-axial collection or mass lesion/mass effect. Vascular: Atherosclerotic vascular calcifications are present. Skull: Sclerotic lesion on the outer table of the right frontal bone consistent with osteoma. No acute depressed fractures identified. Sinuses/Orbits: Opacification of the left sphenoid sinus and some of the left ethmoid air cells. 2 postoperative change and deformity of the left globe. Other: No significant changes since prior study. Examination is somewhat limited by motion artifact. IMPRESSION: No acute intracranial abnormalities. Electronically Signed   By: Lucienne Capers M.D.   On: 10/21/2015 00:50    Procedures Procedures (including critical care time)  Medications Ordered in ED Medications  0.9 %  sodium chloride infusion ( Intravenous New Bag/Given 10/20/15 2234)  fentaNYL (SUBLIMAZE) injection 100 mcg (100 mcg Intravenous Not Given 10/21/15 0151)  acetaminophen (TYLENOL) tablet 650 mg (650 mg Oral Given 10/20/15 2106)  ondansetron (ZOFRAN-ODT) disintegrating tablet 4 mg (4 mg Oral Given 10/20/15 2028)  diphenhydrAMINE (BENADRYL) injection 25 mg (25 mg Intravenous Given 10/20/15 2358)  metoCLOPramide (REGLAN) injection 10 mg (10 mg Intravenous Given 10/20/15 2358)  fentaNYL (SUBLIMAZE) injection 75 mcg (75 mcg Intravenous Given 10/21/15 0045)     Initial Impression / Assessment and Plan / ED Course  I have reviewed the triage vital signs and the nursing notes.  Pertinent labs & imaging results that were available during my care of the patient were reviewed by me and considered in my medical decision making (see chart for details).  Clinical Course    Normal Head Ct and pain completely resolved with therapy in the  ED. Now rates pain at 0/10, patient ate [place] sandwich and had Orange Juice in the ED.  Pt HA treated and improved while in ED.  Presentation is like pts typical HA and non concerning for [facility], ICH, Meningitis, or temporal arteritis. Pt is afebrile with no focal neuro deficits, nuchal rigidity, or change in vision. Pt is to follow up with PCP to discuss prophylactic medication. Pt verbalizes understanding and is agreeable with plan to dc.    Final Clinical Impressions(s) / ED Diagnoses   Final diagnoses:  Nonintractable headache, unspecified chronicity pattern, unspecified headache type    New Prescriptions New Prescriptions   No medications on file     Delos Haring, Hershal Coria 10/21/15 0203    Blanchie Dessert, MD 10/21/15 2139

## 2015-10-21 MED ORDER — FENTANYL CITRATE (PF) 100 MCG/2ML IJ SOLN
75.0000 ug | Freq: Once | INTRAMUSCULAR | Status: AC
  Administered 2015-10-21: 75 ug via INTRAVENOUS
  Filled 2015-10-21: qty 2

## 2015-10-21 MED ORDER — FENTANYL CITRATE (PF) 100 MCG/2ML IJ SOLN
100.0000 ug | Freq: Once | INTRAMUSCULAR | Status: DC
  Filled 2015-10-21: qty 2

## 2015-10-25 DIAGNOSIS — L02211 Cutaneous abscess of abdominal wall: Secondary | ICD-10-CM | POA: Diagnosis not present

## 2015-10-26 DIAGNOSIS — E1122 Type 2 diabetes mellitus with diabetic chronic kidney disease: Secondary | ICD-10-CM | POA: Diagnosis not present

## 2015-10-26 DIAGNOSIS — I2510 Atherosclerotic heart disease of native coronary artery without angina pectoris: Secondary | ICD-10-CM | POA: Diagnosis not present

## 2015-10-26 DIAGNOSIS — L02211 Cutaneous abscess of abdominal wall: Secondary | ICD-10-CM | POA: Diagnosis not present

## 2015-10-26 DIAGNOSIS — I5030 Unspecified diastolic (congestive) heart failure: Secondary | ICD-10-CM | POA: Diagnosis not present

## 2015-10-26 DIAGNOSIS — J45909 Unspecified asthma, uncomplicated: Secondary | ICD-10-CM | POA: Diagnosis not present

## 2015-10-26 DIAGNOSIS — I132 Hypertensive heart and chronic kidney disease with heart failure and with stage 5 chronic kidney disease, or end stage renal disease: Secondary | ICD-10-CM | POA: Diagnosis not present

## 2015-10-27 DIAGNOSIS — L02211 Cutaneous abscess of abdominal wall: Secondary | ICD-10-CM | POA: Diagnosis not present

## 2015-10-27 DIAGNOSIS — E1122 Type 2 diabetes mellitus with diabetic chronic kidney disease: Secondary | ICD-10-CM | POA: Diagnosis not present

## 2015-10-27 DIAGNOSIS — J45909 Unspecified asthma, uncomplicated: Secondary | ICD-10-CM | POA: Diagnosis not present

## 2015-10-27 DIAGNOSIS — I2510 Atherosclerotic heart disease of native coronary artery without angina pectoris: Secondary | ICD-10-CM | POA: Diagnosis not present

## 2015-10-27 DIAGNOSIS — I132 Hypertensive heart and chronic kidney disease with heart failure and with stage 5 chronic kidney disease, or end stage renal disease: Secondary | ICD-10-CM | POA: Diagnosis not present

## 2015-10-27 DIAGNOSIS — I5030 Unspecified diastolic (congestive) heart failure: Secondary | ICD-10-CM | POA: Diagnosis not present

## 2015-10-28 DIAGNOSIS — I2510 Atherosclerotic heart disease of native coronary artery without angina pectoris: Secondary | ICD-10-CM | POA: Diagnosis not present

## 2015-10-28 DIAGNOSIS — I132 Hypertensive heart and chronic kidney disease with heart failure and with stage 5 chronic kidney disease, or end stage renal disease: Secondary | ICD-10-CM | POA: Diagnosis not present

## 2015-10-28 DIAGNOSIS — J45909 Unspecified asthma, uncomplicated: Secondary | ICD-10-CM | POA: Diagnosis not present

## 2015-10-28 DIAGNOSIS — E1122 Type 2 diabetes mellitus with diabetic chronic kidney disease: Secondary | ICD-10-CM | POA: Diagnosis not present

## 2015-10-28 DIAGNOSIS — I5030 Unspecified diastolic (congestive) heart failure: Secondary | ICD-10-CM | POA: Diagnosis not present

## 2015-10-28 DIAGNOSIS — L02211 Cutaneous abscess of abdominal wall: Secondary | ICD-10-CM | POA: Diagnosis not present

## 2015-10-29 DIAGNOSIS — L02211 Cutaneous abscess of abdominal wall: Secondary | ICD-10-CM | POA: Diagnosis not present

## 2015-10-29 DIAGNOSIS — E1122 Type 2 diabetes mellitus with diabetic chronic kidney disease: Secondary | ICD-10-CM | POA: Diagnosis not present

## 2015-10-29 DIAGNOSIS — I5030 Unspecified diastolic (congestive) heart failure: Secondary | ICD-10-CM | POA: Diagnosis not present

## 2015-10-29 DIAGNOSIS — I132 Hypertensive heart and chronic kidney disease with heart failure and with stage 5 chronic kidney disease, or end stage renal disease: Secondary | ICD-10-CM | POA: Diagnosis not present

## 2015-10-29 DIAGNOSIS — I2510 Atherosclerotic heart disease of native coronary artery without angina pectoris: Secondary | ICD-10-CM | POA: Diagnosis not present

## 2015-10-29 DIAGNOSIS — J45909 Unspecified asthma, uncomplicated: Secondary | ICD-10-CM | POA: Diagnosis not present

## 2015-10-30 DIAGNOSIS — L02211 Cutaneous abscess of abdominal wall: Secondary | ICD-10-CM | POA: Diagnosis not present

## 2015-10-30 DIAGNOSIS — J45909 Unspecified asthma, uncomplicated: Secondary | ICD-10-CM | POA: Diagnosis not present

## 2015-10-30 DIAGNOSIS — L738 Other specified follicular disorders: Secondary | ICD-10-CM | POA: Diagnosis not present

## 2015-10-30 DIAGNOSIS — Z23 Encounter for immunization: Secondary | ICD-10-CM | POA: Diagnosis not present

## 2015-10-30 DIAGNOSIS — I132 Hypertensive heart and chronic kidney disease with heart failure and with stage 5 chronic kidney disease, or end stage renal disease: Secondary | ICD-10-CM | POA: Diagnosis not present

## 2015-10-30 DIAGNOSIS — L700 Acne vulgaris: Secondary | ICD-10-CM | POA: Diagnosis not present

## 2015-10-30 DIAGNOSIS — I5030 Unspecified diastolic (congestive) heart failure: Secondary | ICD-10-CM | POA: Diagnosis not present

## 2015-10-30 DIAGNOSIS — E1122 Type 2 diabetes mellitus with diabetic chronic kidney disease: Secondary | ICD-10-CM | POA: Diagnosis not present

## 2015-10-30 DIAGNOSIS — L821 Other seborrheic keratosis: Secondary | ICD-10-CM | POA: Diagnosis not present

## 2015-10-30 DIAGNOSIS — I2510 Atherosclerotic heart disease of native coronary artery without angina pectoris: Secondary | ICD-10-CM | POA: Diagnosis not present

## 2015-10-30 DIAGNOSIS — L723 Sebaceous cyst: Secondary | ICD-10-CM | POA: Diagnosis not present

## 2015-10-31 DIAGNOSIS — J45909 Unspecified asthma, uncomplicated: Secondary | ICD-10-CM | POA: Diagnosis not present

## 2015-10-31 DIAGNOSIS — I132 Hypertensive heart and chronic kidney disease with heart failure and with stage 5 chronic kidney disease, or end stage renal disease: Secondary | ICD-10-CM | POA: Diagnosis not present

## 2015-10-31 DIAGNOSIS — E1122 Type 2 diabetes mellitus with diabetic chronic kidney disease: Secondary | ICD-10-CM | POA: Diagnosis not present

## 2015-10-31 DIAGNOSIS — I2510 Atherosclerotic heart disease of native coronary artery without angina pectoris: Secondary | ICD-10-CM | POA: Diagnosis not present

## 2015-10-31 DIAGNOSIS — I5030 Unspecified diastolic (congestive) heart failure: Secondary | ICD-10-CM | POA: Diagnosis not present

## 2015-10-31 DIAGNOSIS — L02211 Cutaneous abscess of abdominal wall: Secondary | ICD-10-CM | POA: Diagnosis not present

## 2015-11-01 DIAGNOSIS — I2510 Atherosclerotic heart disease of native coronary artery without angina pectoris: Secondary | ICD-10-CM | POA: Diagnosis not present

## 2015-11-01 DIAGNOSIS — J45909 Unspecified asthma, uncomplicated: Secondary | ICD-10-CM | POA: Diagnosis not present

## 2015-11-01 DIAGNOSIS — I132 Hypertensive heart and chronic kidney disease with heart failure and with stage 5 chronic kidney disease, or end stage renal disease: Secondary | ICD-10-CM | POA: Diagnosis not present

## 2015-11-01 DIAGNOSIS — E1122 Type 2 diabetes mellitus with diabetic chronic kidney disease: Secondary | ICD-10-CM | POA: Diagnosis not present

## 2015-11-01 DIAGNOSIS — I5030 Unspecified diastolic (congestive) heart failure: Secondary | ICD-10-CM | POA: Diagnosis not present

## 2015-11-01 DIAGNOSIS — L02211 Cutaneous abscess of abdominal wall: Secondary | ICD-10-CM | POA: Diagnosis not present

## 2015-11-02 DIAGNOSIS — L02211 Cutaneous abscess of abdominal wall: Secondary | ICD-10-CM | POA: Diagnosis not present

## 2015-11-03 ENCOUNTER — Other Ambulatory Visit: Payer: Self-pay | Admitting: Internal Medicine

## 2015-11-03 DIAGNOSIS — J45909 Unspecified asthma, uncomplicated: Secondary | ICD-10-CM | POA: Diagnosis not present

## 2015-11-03 DIAGNOSIS — L02211 Cutaneous abscess of abdominal wall: Secondary | ICD-10-CM | POA: Diagnosis not present

## 2015-11-03 DIAGNOSIS — I132 Hypertensive heart and chronic kidney disease with heart failure and with stage 5 chronic kidney disease, or end stage renal disease: Secondary | ICD-10-CM | POA: Diagnosis not present

## 2015-11-03 DIAGNOSIS — I2510 Atherosclerotic heart disease of native coronary artery without angina pectoris: Secondary | ICD-10-CM | POA: Diagnosis not present

## 2015-11-03 DIAGNOSIS — I5030 Unspecified diastolic (congestive) heart failure: Secondary | ICD-10-CM | POA: Diagnosis not present

## 2015-11-03 DIAGNOSIS — E1122 Type 2 diabetes mellitus with diabetic chronic kidney disease: Secondary | ICD-10-CM | POA: Diagnosis not present

## 2015-11-05 DIAGNOSIS — I5030 Unspecified diastolic (congestive) heart failure: Secondary | ICD-10-CM | POA: Diagnosis not present

## 2015-11-05 DIAGNOSIS — E1122 Type 2 diabetes mellitus with diabetic chronic kidney disease: Secondary | ICD-10-CM | POA: Diagnosis not present

## 2015-11-05 DIAGNOSIS — I2510 Atherosclerotic heart disease of native coronary artery without angina pectoris: Secondary | ICD-10-CM | POA: Diagnosis not present

## 2015-11-05 DIAGNOSIS — J45909 Unspecified asthma, uncomplicated: Secondary | ICD-10-CM | POA: Diagnosis not present

## 2015-11-05 DIAGNOSIS — I132 Hypertensive heart and chronic kidney disease with heart failure and with stage 5 chronic kidney disease, or end stage renal disease: Secondary | ICD-10-CM | POA: Diagnosis not present

## 2015-11-05 DIAGNOSIS — L02211 Cutaneous abscess of abdominal wall: Secondary | ICD-10-CM | POA: Diagnosis not present

## 2015-11-08 DIAGNOSIS — I5030 Unspecified diastolic (congestive) heart failure: Secondary | ICD-10-CM | POA: Diagnosis not present

## 2015-11-08 DIAGNOSIS — L02211 Cutaneous abscess of abdominal wall: Secondary | ICD-10-CM | POA: Diagnosis not present

## 2015-11-08 DIAGNOSIS — I132 Hypertensive heart and chronic kidney disease with heart failure and with stage 5 chronic kidney disease, or end stage renal disease: Secondary | ICD-10-CM | POA: Diagnosis not present

## 2015-11-08 DIAGNOSIS — E1122 Type 2 diabetes mellitus with diabetic chronic kidney disease: Secondary | ICD-10-CM | POA: Diagnosis not present

## 2015-11-08 DIAGNOSIS — I2510 Atherosclerotic heart disease of native coronary artery without angina pectoris: Secondary | ICD-10-CM | POA: Diagnosis not present

## 2015-11-08 DIAGNOSIS — J45909 Unspecified asthma, uncomplicated: Secondary | ICD-10-CM | POA: Diagnosis not present

## 2015-11-13 DIAGNOSIS — Z940 Kidney transplant status: Secondary | ICD-10-CM | POA: Diagnosis not present

## 2015-11-14 DIAGNOSIS — Z940 Kidney transplant status: Secondary | ICD-10-CM | POA: Diagnosis not present

## 2015-11-14 DIAGNOSIS — E039 Hypothyroidism, unspecified: Secondary | ICD-10-CM | POA: Diagnosis not present

## 2015-11-14 DIAGNOSIS — I2510 Atherosclerotic heart disease of native coronary artery without angina pectoris: Secondary | ICD-10-CM | POA: Diagnosis not present

## 2015-11-14 DIAGNOSIS — I130 Hypertensive heart and chronic kidney disease with heart failure and stage 1 through stage 4 chronic kidney disease, or unspecified chronic kidney disease: Secondary | ICD-10-CM | POA: Diagnosis not present

## 2015-11-14 DIAGNOSIS — I313 Pericardial effusion (noninflammatory): Secondary | ICD-10-CM | POA: Diagnosis not present

## 2015-11-14 DIAGNOSIS — E785 Hyperlipidemia, unspecified: Secondary | ICD-10-CM | POA: Diagnosis not present

## 2015-11-14 DIAGNOSIS — Z8673 Personal history of transient ischemic attack (TIA), and cerebral infarction without residual deficits: Secondary | ICD-10-CM | POA: Diagnosis not present

## 2015-11-14 DIAGNOSIS — J449 Chronic obstructive pulmonary disease, unspecified: Secondary | ICD-10-CM | POA: Diagnosis not present

## 2015-11-14 DIAGNOSIS — R05 Cough: Secondary | ICD-10-CM | POA: Diagnosis not present

## 2015-11-14 DIAGNOSIS — E1122 Type 2 diabetes mellitus with diabetic chronic kidney disease: Secondary | ICD-10-CM | POA: Diagnosis not present

## 2015-11-14 DIAGNOSIS — R946 Abnormal results of thyroid function studies: Secondary | ICD-10-CM | POA: Diagnosis not present

## 2015-11-14 DIAGNOSIS — R0982 Postnasal drip: Secondary | ICD-10-CM | POA: Diagnosis not present

## 2015-11-14 DIAGNOSIS — R0602 Shortness of breath: Secondary | ICD-10-CM | POA: Diagnosis not present

## 2015-11-14 DIAGNOSIS — J9811 Atelectasis: Secondary | ICD-10-CM | POA: Diagnosis not present

## 2015-11-14 DIAGNOSIS — I5033 Acute on chronic diastolic (congestive) heart failure: Secondary | ICD-10-CM | POA: Diagnosis not present

## 2015-11-14 DIAGNOSIS — I509 Heart failure, unspecified: Secondary | ICD-10-CM | POA: Diagnosis not present

## 2015-11-14 DIAGNOSIS — K219 Gastro-esophageal reflux disease without esophagitis: Secondary | ICD-10-CM | POA: Diagnosis not present

## 2015-11-14 DIAGNOSIS — J029 Acute pharyngitis, unspecified: Secondary | ICD-10-CM | POA: Diagnosis not present

## 2015-11-14 DIAGNOSIS — Z880 Allergy status to penicillin: Secondary | ICD-10-CM | POA: Diagnosis not present

## 2015-11-14 DIAGNOSIS — Z883 Allergy status to other anti-infective agents status: Secondary | ICD-10-CM | POA: Diagnosis not present

## 2015-11-14 DIAGNOSIS — I252 Old myocardial infarction: Secondary | ICD-10-CM | POA: Diagnosis not present

## 2015-11-14 DIAGNOSIS — I5023 Acute on chronic systolic (congestive) heart failure: Secondary | ICD-10-CM | POA: Diagnosis not present

## 2015-11-14 DIAGNOSIS — N183 Chronic kidney disease, stage 3 (moderate): Secondary | ICD-10-CM | POA: Diagnosis not present

## 2015-11-14 DIAGNOSIS — Z886 Allergy status to analgesic agent status: Secondary | ICD-10-CM | POA: Diagnosis not present

## 2015-11-14 DIAGNOSIS — Z853 Personal history of malignant neoplasm of breast: Secondary | ICD-10-CM | POA: Diagnosis not present

## 2015-11-15 DIAGNOSIS — E039 Hypothyroidism, unspecified: Secondary | ICD-10-CM | POA: Diagnosis not present

## 2015-11-15 DIAGNOSIS — R0602 Shortness of breath: Secondary | ICD-10-CM | POA: Diagnosis not present

## 2015-11-15 DIAGNOSIS — E1122 Type 2 diabetes mellitus with diabetic chronic kidney disease: Secondary | ICD-10-CM | POA: Diagnosis not present

## 2015-11-15 DIAGNOSIS — Z940 Kidney transplant status: Secondary | ICD-10-CM | POA: Diagnosis not present

## 2015-11-15 DIAGNOSIS — I313 Pericardial effusion (noninflammatory): Secondary | ICD-10-CM | POA: Diagnosis not present

## 2015-11-15 DIAGNOSIS — J449 Chronic obstructive pulmonary disease, unspecified: Secondary | ICD-10-CM | POA: Diagnosis not present

## 2015-11-15 DIAGNOSIS — R946 Abnormal results of thyroid function studies: Secondary | ICD-10-CM | POA: Diagnosis not present

## 2015-11-15 DIAGNOSIS — I5033 Acute on chronic diastolic (congestive) heart failure: Secondary | ICD-10-CM | POA: Diagnosis not present

## 2015-11-15 DIAGNOSIS — N183 Chronic kidney disease, stage 3 (moderate): Secondary | ICD-10-CM | POA: Diagnosis not present

## 2015-11-15 DIAGNOSIS — I130 Hypertensive heart and chronic kidney disease with heart failure and stage 1 through stage 4 chronic kidney disease, or unspecified chronic kidney disease: Secondary | ICD-10-CM | POA: Diagnosis not present

## 2015-11-15 DIAGNOSIS — I5023 Acute on chronic systolic (congestive) heart failure: Secondary | ICD-10-CM | POA: Diagnosis not present

## 2015-11-16 DIAGNOSIS — N186 End stage renal disease: Secondary | ICD-10-CM | POA: Diagnosis not present

## 2015-11-16 DIAGNOSIS — I10 Essential (primary) hypertension: Secondary | ICD-10-CM | POA: Diagnosis not present

## 2015-11-16 DIAGNOSIS — I313 Pericardial effusion (noninflammatory): Secondary | ICD-10-CM | POA: Diagnosis not present

## 2015-11-16 DIAGNOSIS — I429 Cardiomyopathy, unspecified: Secondary | ICD-10-CM | POA: Diagnosis not present

## 2015-11-18 DIAGNOSIS — E1122 Type 2 diabetes mellitus with diabetic chronic kidney disease: Secondary | ICD-10-CM | POA: Diagnosis not present

## 2015-11-18 DIAGNOSIS — I132 Hypertensive heart and chronic kidney disease with heart failure and with stage 5 chronic kidney disease, or end stage renal disease: Secondary | ICD-10-CM | POA: Diagnosis not present

## 2015-11-18 DIAGNOSIS — L02211 Cutaneous abscess of abdominal wall: Secondary | ICD-10-CM | POA: Diagnosis not present

## 2015-11-18 DIAGNOSIS — I2510 Atherosclerotic heart disease of native coronary artery without angina pectoris: Secondary | ICD-10-CM | POA: Diagnosis not present

## 2015-11-18 DIAGNOSIS — I5030 Unspecified diastolic (congestive) heart failure: Secondary | ICD-10-CM | POA: Diagnosis not present

## 2015-11-18 DIAGNOSIS — J45909 Unspecified asthma, uncomplicated: Secondary | ICD-10-CM | POA: Diagnosis not present

## 2015-11-20 DIAGNOSIS — L02211 Cutaneous abscess of abdominal wall: Secondary | ICD-10-CM | POA: Diagnosis not present

## 2015-11-24 DIAGNOSIS — E559 Vitamin D deficiency, unspecified: Secondary | ICD-10-CM | POA: Diagnosis not present

## 2015-11-24 DIAGNOSIS — M79642 Pain in left hand: Secondary | ICD-10-CM | POA: Diagnosis not present

## 2015-11-24 DIAGNOSIS — M79672 Pain in left foot: Secondary | ICD-10-CM | POA: Diagnosis not present

## 2015-11-24 DIAGNOSIS — M109 Gout, unspecified: Secondary | ICD-10-CM | POA: Diagnosis not present

## 2015-11-24 DIAGNOSIS — M79641 Pain in right hand: Secondary | ICD-10-CM | POA: Diagnosis not present

## 2015-11-24 DIAGNOSIS — M79671 Pain in right foot: Secondary | ICD-10-CM | POA: Diagnosis not present

## 2015-11-24 DIAGNOSIS — M130 Polyarthritis, unspecified: Secondary | ICD-10-CM | POA: Diagnosis not present

## 2015-11-24 DIAGNOSIS — R5383 Other fatigue: Secondary | ICD-10-CM | POA: Diagnosis not present

## 2015-11-24 DIAGNOSIS — Z79899 Other long term (current) drug therapy: Secondary | ICD-10-CM | POA: Diagnosis not present

## 2015-11-27 DIAGNOSIS — Z940 Kidney transplant status: Secondary | ICD-10-CM | POA: Diagnosis not present

## 2015-11-29 ENCOUNTER — Inpatient Hospital Stay: Payer: Medicare Other | Admitting: Internal Medicine

## 2015-11-29 ENCOUNTER — Telehealth: Payer: Self-pay | Admitting: Internal Medicine

## 2015-11-29 ENCOUNTER — Encounter: Payer: Self-pay | Admitting: Internal Medicine

## 2015-11-29 DIAGNOSIS — Z0289 Encounter for other administrative examinations: Secondary | ICD-10-CM

## 2015-11-29 NOTE — Telephone Encounter (Signed)
Per dr Jenny Reichmann--- dismissal letter created in error-- dismisaal letter does not apply at this time per dr Jenny Reichmann

## 2015-12-04 ENCOUNTER — Other Ambulatory Visit: Payer: Self-pay | Admitting: Cardiovascular Disease

## 2015-12-04 DIAGNOSIS — L02211 Cutaneous abscess of abdominal wall: Secondary | ICD-10-CM | POA: Diagnosis not present

## 2015-12-05 DIAGNOSIS — I429 Cardiomyopathy, unspecified: Secondary | ICD-10-CM | POA: Diagnosis not present

## 2015-12-05 DIAGNOSIS — I313 Pericardial effusion (noninflammatory): Secondary | ICD-10-CM | POA: Diagnosis not present

## 2015-12-05 DIAGNOSIS — I10 Essential (primary) hypertension: Secondary | ICD-10-CM | POA: Diagnosis not present

## 2015-12-05 DIAGNOSIS — N186 End stage renal disease: Secondary | ICD-10-CM | POA: Diagnosis not present

## 2015-12-06 ENCOUNTER — Inpatient Hospital Stay: Payer: Medicare Other | Admitting: Internal Medicine

## 2015-12-08 DIAGNOSIS — I10 Essential (primary) hypertension: Secondary | ICD-10-CM | POA: Diagnosis not present

## 2015-12-08 DIAGNOSIS — Z794 Long term (current) use of insulin: Secondary | ICD-10-CM | POA: Diagnosis not present

## 2015-12-08 DIAGNOSIS — Z940 Kidney transplant status: Secondary | ICD-10-CM | POA: Diagnosis not present

## 2015-12-08 DIAGNOSIS — Z7952 Long term (current) use of systemic steroids: Secondary | ICD-10-CM | POA: Diagnosis not present

## 2015-12-08 DIAGNOSIS — N289 Disorder of kidney and ureter, unspecified: Secondary | ICD-10-CM | POA: Diagnosis not present

## 2015-12-08 DIAGNOSIS — M10341 Gout due to renal impairment, right hand: Secondary | ICD-10-CM | POA: Diagnosis not present

## 2015-12-08 DIAGNOSIS — E119 Type 2 diabetes mellitus without complications: Secondary | ICD-10-CM | POA: Diagnosis not present

## 2015-12-15 ENCOUNTER — Encounter: Payer: Self-pay | Admitting: Internal Medicine

## 2015-12-19 ENCOUNTER — Other Ambulatory Visit: Payer: Self-pay | Admitting: Internal Medicine

## 2015-12-19 DIAGNOSIS — Z853 Personal history of malignant neoplasm of breast: Secondary | ICD-10-CM

## 2015-12-20 ENCOUNTER — Telehealth: Payer: Self-pay | Admitting: Internal Medicine

## 2015-12-20 NOTE — Telephone Encounter (Signed)
Patient dismissed from [facility] by Dr. [name] , effective December 15, 2015. Dismissal letter sent out by certified / registered mail. [place]

## 2015-12-25 DIAGNOSIS — I129 Hypertensive chronic kidney disease with stage 1 through stage 4 chronic kidney disease, or unspecified chronic kidney disease: Secondary | ICD-10-CM | POA: Diagnosis not present

## 2015-12-25 DIAGNOSIS — M109 Gout, unspecified: Secondary | ICD-10-CM | POA: Diagnosis not present

## 2015-12-25 DIAGNOSIS — Z940 Kidney transplant status: Secondary | ICD-10-CM | POA: Diagnosis not present

## 2015-12-25 DIAGNOSIS — E118 Type 2 diabetes mellitus with unspecified complications: Secondary | ICD-10-CM | POA: Diagnosis not present

## 2015-12-25 DIAGNOSIS — N183 Chronic kidney disease, stage 3 (moderate): Secondary | ICD-10-CM | POA: Diagnosis not present

## 2015-12-25 DIAGNOSIS — N2581 Secondary hyperparathyroidism of renal origin: Secondary | ICD-10-CM | POA: Diagnosis not present

## 2015-12-25 DIAGNOSIS — E8770 Fluid overload, unspecified: Secondary | ICD-10-CM | POA: Diagnosis not present

## 2015-12-26 DIAGNOSIS — N183 Chronic kidney disease, stage 3 (moderate): Secondary | ICD-10-CM | POA: Diagnosis not present

## 2015-12-26 DIAGNOSIS — Z940 Kidney transplant status: Secondary | ICD-10-CM | POA: Diagnosis not present

## 2015-12-26 DIAGNOSIS — M109 Gout, unspecified: Secondary | ICD-10-CM | POA: Diagnosis not present

## 2015-12-26 DIAGNOSIS — N2581 Secondary hyperparathyroidism of renal origin: Secondary | ICD-10-CM | POA: Diagnosis not present

## 2015-12-27 NOTE — Telephone Encounter (Signed)
Received signed domestic return receipt verifying delivery of certified letter on 0000000 [id number] [id number]

## 2015-12-29 DIAGNOSIS — M79672 Pain in left foot: Secondary | ICD-10-CM | POA: Diagnosis not present

## 2015-12-29 DIAGNOSIS — M109 Gout, unspecified: Secondary | ICD-10-CM | POA: Diagnosis not present

## 2015-12-29 DIAGNOSIS — M0579 Rheumatoid arthritis with rheumatoid factor of multiple sites without organ or systems involvement: Secondary | ICD-10-CM | POA: Diagnosis not present

## 2015-12-29 DIAGNOSIS — M79671 Pain in right foot: Secondary | ICD-10-CM | POA: Diagnosis not present

## 2016-01-01 ENCOUNTER — Other Ambulatory Visit: Payer: Self-pay

## 2016-01-01 ENCOUNTER — Other Ambulatory Visit: Payer: Self-pay | Admitting: Internal Medicine

## 2016-01-01 DIAGNOSIS — Z853 Personal history of malignant neoplasm of breast: Secondary | ICD-10-CM

## 2016-01-02 ENCOUNTER — Ambulatory Visit
Admission: RE | Admit: 2016-01-02 | Discharge: 2016-01-02 | Disposition: A | Discharge status: Home / Self Care | Payer: Medicare Other | Source: Ambulatory Visit | Attending: Internal Medicine | Admitting: Internal Medicine

## 2016-01-02 DIAGNOSIS — R922 Inconclusive mammogram: Secondary | ICD-10-CM | POA: Diagnosis not present

## 2016-01-02 DIAGNOSIS — Z853 Personal history of malignant neoplasm of breast: Secondary | ICD-10-CM

## 2016-01-04 ENCOUNTER — Other Ambulatory Visit: Payer: Self-pay | Admitting: Internal Medicine

## 2016-01-12 DIAGNOSIS — Z940 Kidney transplant status: Secondary | ICD-10-CM | POA: Diagnosis not present

## 2016-02-09 ENCOUNTER — Other Ambulatory Visit: Payer: Self-pay | Admitting: Internal Medicine

## 2016-02-09 DIAGNOSIS — R7989 Other specified abnormal findings of blood chemistry: Secondary | ICD-10-CM | POA: Diagnosis not present

## 2016-02-09 DIAGNOSIS — J9811 Atelectasis: Secondary | ICD-10-CM | POA: Diagnosis not present

## 2016-02-09 DIAGNOSIS — R531 Weakness: Secondary | ICD-10-CM | POA: Diagnosis not present

## 2016-02-09 DIAGNOSIS — M109 Gout, unspecified: Secondary | ICD-10-CM | POA: Diagnosis not present

## 2016-02-09 DIAGNOSIS — E1122 Type 2 diabetes mellitus with diabetic chronic kidney disease: Secondary | ICD-10-CM | POA: Diagnosis not present

## 2016-02-09 DIAGNOSIS — Z885 Allergy status to narcotic agent status: Secondary | ICD-10-CM | POA: Diagnosis not present

## 2016-02-09 DIAGNOSIS — K567 Ileus, unspecified: Secondary | ICD-10-CM | POA: Diagnosis not present

## 2016-02-09 DIAGNOSIS — H5440 Blindness, one eye, unspecified eye: Secondary | ICD-10-CM | POA: Diagnosis not present

## 2016-02-09 DIAGNOSIS — R110 Nausea: Secondary | ICD-10-CM | POA: Diagnosis not present

## 2016-02-09 DIAGNOSIS — M10062 Idiopathic gout, left knee: Secondary | ICD-10-CM | POA: Diagnosis not present

## 2016-02-09 DIAGNOSIS — Z940 Kidney transplant status: Secondary | ICD-10-CM | POA: Diagnosis not present

## 2016-02-09 DIAGNOSIS — I252 Old myocardial infarction: Secondary | ICD-10-CM | POA: Diagnosis not present

## 2016-02-09 DIAGNOSIS — E860 Dehydration: Secondary | ICD-10-CM | POA: Diagnosis not present

## 2016-02-09 DIAGNOSIS — I132 Hypertensive heart and chronic kidney disease with heart failure and with stage 5 chronic kidney disease, or end stage renal disease: Secondary | ICD-10-CM | POA: Diagnosis not present

## 2016-02-09 DIAGNOSIS — R112 Nausea with vomiting, unspecified: Secondary | ICD-10-CM | POA: Diagnosis not present

## 2016-02-09 DIAGNOSIS — I2510 Atherosclerotic heart disease of native coronary artery without angina pectoris: Secondary | ICD-10-CM | POA: Diagnosis not present

## 2016-02-09 DIAGNOSIS — E039 Hypothyroidism, unspecified: Secondary | ICD-10-CM | POA: Diagnosis not present

## 2016-02-09 DIAGNOSIS — Z794 Long term (current) use of insulin: Secondary | ICD-10-CM | POA: Diagnosis not present

## 2016-02-09 DIAGNOSIS — I5030 Unspecified diastolic (congestive) heart failure: Secondary | ICD-10-CM | POA: Diagnosis not present

## 2016-02-09 DIAGNOSIS — N186 End stage renal disease: Secondary | ICD-10-CM | POA: Diagnosis not present

## 2016-02-09 DIAGNOSIS — Z881 Allergy status to other antibiotic agents status: Secondary | ICD-10-CM | POA: Diagnosis not present

## 2016-02-09 DIAGNOSIS — R079 Chest pain, unspecified: Secondary | ICD-10-CM | POA: Diagnosis not present

## 2016-02-09 DIAGNOSIS — K5730 Diverticulosis of large intestine without perforation or abscess without bleeding: Secondary | ICD-10-CM | POA: Diagnosis not present

## 2016-02-09 DIAGNOSIS — Z880 Allergy status to penicillin: Secondary | ICD-10-CM | POA: Diagnosis not present

## 2016-02-09 DIAGNOSIS — J45909 Unspecified asthma, uncomplicated: Secondary | ICD-10-CM | POA: Diagnosis not present

## 2016-02-09 DIAGNOSIS — N179 Acute kidney failure, unspecified: Secondary | ICD-10-CM | POA: Diagnosis not present

## 2016-02-10 DIAGNOSIS — R531 Weakness: Secondary | ICD-10-CM | POA: Diagnosis not present

## 2016-02-10 DIAGNOSIS — R112 Nausea with vomiting, unspecified: Secondary | ICD-10-CM | POA: Diagnosis not present

## 2016-02-12 ENCOUNTER — Other Ambulatory Visit: Payer: Self-pay | Admitting: Cardiovascular Disease

## 2016-02-12 ENCOUNTER — Other Ambulatory Visit: Payer: Self-pay | Admitting: Internal Medicine

## 2016-02-19 DIAGNOSIS — E1122 Type 2 diabetes mellitus with diabetic chronic kidney disease: Secondary | ICD-10-CM | POA: Diagnosis not present

## 2016-02-19 DIAGNOSIS — Z940 Kidney transplant status: Secondary | ICD-10-CM | POA: Diagnosis not present

## 2016-02-19 DIAGNOSIS — E8770 Fluid overload, unspecified: Secondary | ICD-10-CM | POA: Diagnosis not present

## 2016-02-19 DIAGNOSIS — I129 Hypertensive chronic kidney disease with stage 1 through stage 4 chronic kidney disease, or unspecified chronic kidney disease: Secondary | ICD-10-CM | POA: Diagnosis not present

## 2016-02-19 DIAGNOSIS — E118 Type 2 diabetes mellitus with unspecified complications: Secondary | ICD-10-CM | POA: Diagnosis not present

## 2016-02-19 DIAGNOSIS — N183 Chronic kidney disease, stage 3 (moderate): Secondary | ICD-10-CM | POA: Diagnosis not present

## 2016-02-19 DIAGNOSIS — N2581 Secondary hyperparathyroidism of renal origin: Secondary | ICD-10-CM | POA: Diagnosis not present

## 2016-02-19 DIAGNOSIS — E11319 Type 2 diabetes mellitus with unspecified diabetic retinopathy without macular edema: Secondary | ICD-10-CM | POA: Diagnosis not present

## 2016-02-20 DIAGNOSIS — M109 Gout, unspecified: Secondary | ICD-10-CM | POA: Diagnosis not present

## 2016-02-20 DIAGNOSIS — N2581 Secondary hyperparathyroidism of renal origin: Secondary | ICD-10-CM | POA: Diagnosis not present

## 2016-02-20 DIAGNOSIS — Z940 Kidney transplant status: Secondary | ICD-10-CM | POA: Diagnosis not present

## 2016-02-20 DIAGNOSIS — N183 Chronic kidney disease, stage 3 (moderate): Secondary | ICD-10-CM | POA: Diagnosis not present

## 2016-02-23 DIAGNOSIS — Z79899 Other long term (current) drug therapy: Secondary | ICD-10-CM | POA: Diagnosis not present

## 2016-02-23 DIAGNOSIS — M79642 Pain in left hand: Secondary | ICD-10-CM | POA: Diagnosis not present

## 2016-02-23 DIAGNOSIS — M79641 Pain in right hand: Secondary | ICD-10-CM | POA: Diagnosis not present

## 2016-02-23 DIAGNOSIS — M130 Polyarthritis, unspecified: Secondary | ICD-10-CM | POA: Diagnosis not present

## 2016-02-23 DIAGNOSIS — E559 Vitamin D deficiency, unspecified: Secondary | ICD-10-CM | POA: Diagnosis not present

## 2016-02-23 DIAGNOSIS — E790 Hyperuricemia without signs of inflammatory arthritis and tophaceous disease: Secondary | ICD-10-CM | POA: Diagnosis not present

## 2016-02-23 DIAGNOSIS — M0579 Rheumatoid arthritis with rheumatoid factor of multiple sites without organ or systems involvement: Secondary | ICD-10-CM | POA: Diagnosis not present

## 2016-03-01 DIAGNOSIS — M109 Gout, unspecified: Secondary | ICD-10-CM | POA: Diagnosis not present

## 2016-03-01 DIAGNOSIS — N179 Acute kidney failure, unspecified: Secondary | ICD-10-CM | POA: Diagnosis not present

## 2016-03-01 DIAGNOSIS — R112 Nausea with vomiting, unspecified: Secondary | ICD-10-CM | POA: Diagnosis not present

## 2016-03-01 DIAGNOSIS — Z09 Encounter for follow-up examination after completed treatment for conditions other than malignant neoplasm: Secondary | ICD-10-CM | POA: Diagnosis not present

## 2016-03-05 NOTE — Progress Notes (Signed)
CLINIC:  Survivorship   REASON FOR VISIT:  Routine follow-up for history of breast cancer.   BRIEF ONCOLOGIC HISTORY:   (1)Right lumpectomy and sentinel lymph node sampling 11/09/2012 for a pT2 pN0, stage IIA invasive ductal carcinoma, grade 3, triple negative, with an MIB-1 of 56%  (2) adjuvant cyclophosphamide, methotrexate and fluorouracil 12-2012, received 3 cycles, then had a fall with injury to her left leg requiring skin grafting, and her subsequent cycles were delayed.  (3) resuming CMF treatment 05/24/2013, completed 07/05/2013  (4) adjuvant radiation in [place], [place] 08/09/2013 through 09/15/2013   INTERVAL HISTORY:  Ms. Radford presents to the [facility] today for routine follow-up for her history of breast cancer.  Overall, she reports feeling quite well. Aneita is haivng no difficulties today.  She saw Dr. Jana Hakim last one year ago.  She is having some arthritis and gout, but is followed by her PCP and reports seeing him regularly.     REVIEW OF SYSTEMS:  Review of Systems  Constitutional: Negative for chills, fever, malaise/fatigue and weight loss.  HENT: Negative for hearing loss and tinnitus.   Eyes: Negative for blurred vision and double vision.  Respiratory: Negative for cough and shortness of breath.   Cardiovascular: Negative for chest pain, palpitations and leg swelling.  Gastrointestinal: Negative for abdominal pain, blood in stool, constipation, diarrhea, heartburn, melena, nausea and vomiting.  Musculoskeletal: Positive for joint pain. Negative for back pain, myalgias and neck pain.  Skin: Negative for rash.  Neurological: Negative for dizziness and headaches.  Endo/Heme/Allergies: Negative for environmental allergies. Does not bruise/bleed easily.  Psychiatric/Behavioral: Negative for depression. The patient is not nervous/anxious.   Breast: Denies any new nodularity, masses, tenderness, nipple changes, or nipple discharge.       PAST MEDICAL/SURGICAL HISTORY:  Past Medical History:  Diagnosis Date  . Anemia in chronic kidney disease   . Asthma   . At risk for sleep apnea    STOP-BANG= 4   SENT TO PCP  03-25-2013  . Blind left eye    OCCLUDED CORNEA,  FUNDI--  FAILED SURGERY REPAIR 2003  . Breast cancer ([place]) DX  OCT 2014  ---  STAGE IIA  DCIS (T2  N0)   11-09-2012  RIGHT BREAST LUMPECTOMY W/ SLN DISSECTION---  CHEMOTHERAPY  . Chronic diastolic CHF (congestive heart failure) ([place])    a. Cardiac MRI (10/2012): Moderate to severe LVH, EF 55%, chordal SAM but no LVOT gradient, mod circumferential effusion, mild LAE.  b.  Echocardiogram (11/06/12): Severe LVH, EF 24-46%, grade 1 diastolic dysfunction, moderate effusion.  . Chronic kidney disease (CKD), stage III (moderate)    NEPHROLOGIST--  DR Freddrick March  . Coronary artery disease CARDIOLOGIST--  DR Johnsie Cancel   NON-OBSTRUCTIVE CAD  PER CARDIAC CATH  2011  . Diabetic retinopathy    RIGHT EYE  . DJD (degenerative joint disease)   . Gastroparesis due to DM ([place])   . GERD (gastroesophageal reflux disease)   . History of CVA (cerebrovascular accident) without residual deficits    2008  AND TIA IN 2008  W/ NO RESIDUAL  . History of kidney stones   . History of myocardial infarction    2011--  S/P CARDIAC CATH ([place])  NON-OBSTRUCTIVE CAD  . History of peptic ulcer    2011--  RESOLVED  . Hx of colonic polyps   . Hyperlipidemia   . Hyperparathyroidism, secondary renal ([place])   . Hypertension   . Hypothyroidism   . Open thigh wound  ANTERIOR  . Pericardial effusion without cardiac tamponade    PER DR NISHAN  NOTE --  NOT MALIGNANT  ? RELATED TO HYPOTHYROIDISM  . S/P kidney transplant    CADAVERIC --  05/2006  . Thyromegaly    MULTINODULER GOITER  . Trigeminal neuralgia   . 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 Wears glasses    Past Surgical History:  Procedure Laterality Date  . BREAST BIOPSY  2010  . BREAST LUMPECTOMY  WITH SENTINEL LYMPH NODE BIOPSY Right 11/09/2012   Procedure: RIGHT BREAST LUMPECTOMY WITH SENTINEL LYMPH NODE REMOVAL;  Surgeon: Haywood Lasso, MD;  Location: [facility];  Service: General;  Laterality: Right;  . CARDIAC CATHETERIZATION  04-12-2009  DR EVELYNE GOUDREAU ([facility] IN [place], [place])   NON-OBSTRUCTIVE CAD/  mLAD 40%,  oLAD 50%,  dCFX 40%,  OM1  40%,  mRCA  &  dRCA  50%/  LVEF 65% /  ELEVATED  LVEDP  . CARDIOVASCULAR STRESS TEST  11-04-2012  DR NISHAN   HIGH RISK NUCLEAR STUDY/  FIXED DEFECT AFFECTING ENTIRE INFEROLATERAL AND ANTEROLATERAL WALL/  MODERATE ISCHEMIA  AT MID/ APICAL ANTEROR AND INFERIOR WALL/  GLOBAL HYPOKINESIS/  LVEF 28% (FELT TO BE FALSE +,  ECHO & cMRI  NORMAL EF AND NORMAL WM)  . CATARACT EXTRACTION W/ INTRAOCULAR LENS  IMPLANT, BILATERAL    . CHOLECYSTECTOMY  2008  . EYE SURGERY Left 2003   REPAIR CORNEA  . FOOT SURGERY Right 2013  . HEMORRHOID SURGERY  01/23/2012   Procedure: HEMORRHOIDECTOMY PROLAPSED;  Surgeon: Leighton Ruff, MD;  Location: [facility];  Service: General;  Laterality: N/A;  . INCISION AND DRAINAGE OF WOUND Left 02/18/2013   Procedure: IRRIGATION AND DEBRIDEMENT THIGH WOUND;  Surgeon: Rolm Bookbinder, MD;  Location: [facility];  Service: General;  Laterality: Left;  . IRRIGATION AND DEBRIDEMENT ABSCESS Left 02/14/2013   Procedure: IRRIGATION AND DEBRIDEMENT LEFT THIGH ABSCESS;  Surgeon: Joyice Faster. Cornett, MD;  Location: [facility];  Service: General;  Laterality: Left;  . PORTACATH PLACEMENT Right 11/30/2012   Procedure: INSERTION PORT-A-CATH;  Surgeon: Haywood Lasso, MD;  Location: [facility];  Service: General;  Laterality: Right;  . REPAIR  INTESTINAL PERFORATION SURGERY  01-31-2009  . SKIN SPLIT GRAFT Left 03/29/2013   Procedure: SKIN GRAFT SPLIT THICKNESS WITH A-CELL AND VAC TO LEFT THIGH;  Surgeon: Theodoro Kos, DO;  Location: [facility];  Service: Plastics;  Laterality: Left;  . TRANSPLANTATION RENAL   05/2006   CADAVERIC  . TRANSTHORACIC ECHOCARDIOGRAM  01-19-2013  DR NISHAN   SEVERE LVH/  EF 23-53%/  GRADE I DIASTOLIC DYSFUNCTION/  MODERATE LAE/  MILD IMPROVEMENT OF MODERATE CIRCUMFERENTIAL INFERIOROLATERAL PERICARDIAL EFFUSION WITH NO TAMPONADE  . VAGINAL HYSTERECTOMY  1990's   W/  BILATERAL SALPINGOOPHORECTOMY   Current Outpatient Prescriptions  Medication Sig Dispense Refill  . albuterol (ACCUNEB) 0.63 MG/3ML nebulizer solution Take 1 ampule by nebulization every 6 (six) hours as needed for wheezing.    Marland Kitchen albuterol (PROVENTIL HFA;VENTOLIN HFA) 108 (90 BASE) MCG/ACT inhaler Inhale 2 puffs into the lungs every 6 (six) hours as needed for wheezing or shortness of breath. 1 Inhaler 11  . allopurinol (ZYLOPRIM) 100 MG tablet Take 100 mg by mouth daily.  6  . amLODipine (NORVASC) 5 MG tablet Take 5 mg by mouth every evening.     . colchicine 0.6 MG tablet Take 0.6 mg by mouth daily.    . diphenhydramine-acetaminophen (TYLENOL PM)  25-500 MG TABS tablet Take 1 tablet by mouth at bedtime as needed.    . furosemide (LASIX) 80 MG tablet Take 160 mg by mouth 3 (three) times daily.    . insulin NPH-regular Human (NOVOLIN 70/30) (70-30) 100 UNIT/ML injection 15-40 units inject under skin twice a day (Patient taking differently: Inject 30 Units into the skin 2 (two) times daily with a meal. ) 60 mL 1  . isosorbide mononitrate (IMDUR) 60 MG 24 hr tablet TAKE 1 TABLET BY MOUTH EVERY DAY**MAKE APPT FOR PHYSICAL AND LABS**FOR FURTURE FILLS 90 tablet 0  . KLOR-CON M20 20 MEQ tablet Take 20 mEq by mouth 2 (two) times daily.     Marland Kitchen levothyroxine (SYNTHROID, LEVOTHROID) 75 MCG tablet Take 1 tablet (75 mcg total) by mouth daily. (Patient taking differently: Take 100 mcg by mouth daily. ) 90 tablet 3  . mycophenolate (CELLCEPT) 500 MG tablet Take 1,000 mg by mouth 2 (two) times daily.  6  . nitroGLYCERIN (NITROSTAT) 0.4 MG SL tablet Place 1 tablet (0.4 mg total) under the tongue every 5 (five) minutes as needed for  chest pain. 25 tablet 5  . pravastatin (PRAVACHOL) 20 MG tablet Take 10 mg by mouth at bedtime.      . predniSONE (DELTASONE) 10 MG tablet TAKE 1 TABLET(10 MG) BY MOUTH EVERY MORNING 30 tablet 0  . timolol (TIMOPTIC) 0.25 % ophthalmic solution Place 1 drop into both eyes 2 (two) times daily.  2  . docusate sodium (COLACE) 100 MG capsule Take 100 mg by mouth 2 (two) times daily.    . feeding supplement, GLUCERNA SHAKE, (GLUCERNA SHAKE) LIQD Take 237 mLs by mouth 2 (two) times daily between meals. (Patient not taking: Reported on 10/20/2015)  0  . metoprolol (LOPRESSOR) 100 MG tablet Take 1 tablet (100 mg total) by mouth 2 (two) times daily. *Patient is overdue for an appt, needs to call and schedule for further refills* 60 tablet 0  . OXYGEN Inhale 2 L into the lungs daily as needed.    . polyethylene glycol powder (GLYCOLAX/MIRALAX) powder MIX 17GM AS DIRECTED DAILY (Patient not taking: Reported on 10/20/2015) 527 g 0  . tacrolimus (PROGRAF) 1 MG capsule Take 9 mg by mouth 2 (two) times daily.     . traMADol (ULTRAM) 50 MG tablet Take 1 tablet (50 mg total) by mouth every 6 (six) hours as needed. (Patient not taking: Reported on 10/20/2015) 15 tablet 0   No current facility-administered medications for this visit.      ALLERGIES:  Allergies  Allergen Reactions  . 2,4-D Dimethylamine (Amisol) Hives  . Keflex [Cephalexin] Swelling    Swelling to face, chills Tolerates zosyn 02/14/13  . Propoxyphene N-Acetaminophen Hives and Itching    Patient can tolerate acetaminophen solely  . Vancomycin Itching    Itching all over when vancomycin given 09/12/14     CURRENT MEDICATIONS:     ONCOLOGIC FAMILY HISTORY:  Family History  Problem Relation Age of Onset  . Heart disease Father   . Arthritis Other     parent  . Heart disease Other     parent  . Hypertension Other     parent  . Diabetes Other     parent  . Diabetes Other     grandparent  . Thyroid disease Other     several  . Kidney  disease Neg Hx      SOCIAL HISTORY:  TREMAINE FUHRIMAN is separated and lives with her daughter in [place],  [place].  She has 4 children total.  One son and one daughter live in [place], [place], and another daughter lives in [place]. Ms. Tatem is currently on disability.  She denies any current or history of tobacco, alcohol, or illicit drug use.     PHYSICAL EXAMINATION:  Vital Signs: Vitals:   03/07/16 1024  BP: 139/76  Pulse: (!) 116  Resp: 18  Temp: 97.8 F (36.6 C)   Filed Weights   03/07/16 1024  Weight: 186 lb 6.4 oz (84.6 kg)   General: Well-nourished, well-appearing female in no acute distress.  Unaccompanied today.   HEENT: Head is normocephalic.  Pupils equal and reactive to light. Conjunctivae clear without exudate.  Sclerae anicteric. Oral mucosa is pink, moist.  Oropharynx is pink without lesions or erythema.  Lymph: No cervical, supraclavicular, or infraclavicular lymphadenopathy noted on palpation.  Cardiovascular: Regular rate and rhythm.Marland Kitchen Respiratory: Clear to auscultation bilaterally. Chest expansion symmetric; breathing non-labored.  Breast Exam:  -Left breast: No appreciable masses on palpation. No skin redness, thickening, or peau d'orange appearance; no nipple retraction or nipple discharge;  -Right breast: No appreciable masses on palpation. No skin redness, thickening, or peau d'orange appearance; no nipple retraction or nipple discharge; mild distortion in symmetry at previous lumpectomy site well healed scar without erythema or nodularity. -Axilla: No axillary adenopathy bilaterally.  GI: Abdomen soft and round; non-tender, non-distended. Bowel sounds normoactive. No hepatosplenomegaly.   GU: Deferred.  Neuro: No focal deficits. Steady gait.  Psych: Mood and affect normal and appropriate for situation.  Extremities: No edema. Skin: Warm and dry.  LABORATORY DATA:  Appointment on 03/07/2016  Component Date Value Ref Range Status  . WBC 03/07/2016 5.7   3.9 - 10.3 10e3/uL Final  . NEUT# 03/07/2016 4.3  1.5 - 6.5 10e3/uL Final  . HGB 03/07/2016 13.6  11.6 - 15.9 g/dL Final  . HCT 03/07/2016 44.4  34.8 - 46.6 % Final  . Platelets 03/07/2016 173  145 - 400 10e3/uL Final  . MCV 03/07/2016 91.0  79.5 - 101.0 fL Final  . MCH 03/07/2016 27.9  25.1 - 34.0 pg Final  . MCHC 03/07/2016 30.6* 31.5 - 36.0 g/dL Final  . RBC 03/07/2016 4.88  3.70 - 5.45 10e6/uL Final  . RDW 03/07/2016 15.7* 11.2 - 14.5 % Final  . lymph# 03/07/2016 0.9  0.9 - 3.3 10e3/uL Final  . MONO# 03/07/2016 0.4  0.1 - 0.9 10e3/uL Final  . Eosinophils Absolute 03/07/2016 0.1  0.0 - 0.5 10e3/uL Final  . Basophils Absolute 03/07/2016 0.0  0.0 - 0.1 10e3/uL Final  . NEUT% 03/07/2016 75.8  38.4 - 76.8 % Final  . LYMPH% 03/07/2016 16.1  14.0 - 49.7 % Final  . MONO% 03/07/2016 6.5  0.0 - 14.0 % Final  . EOS% 03/07/2016 1.2  0.0 - 7.0 % Final  . BASO% 03/07/2016 0.4  0.0 - 2.0 % Final  . Sodium 03/07/2016 144  136 - 145 mEq/L Final  . Potassium 03/07/2016 3.4* 3.5 - 5.1 mEq/L Final  . Chloride 03/07/2016 102  98 - 109 mEq/L Final  . CO2 03/07/2016 31* 22 - 29 mEq/L Final  . Glucose 03/07/2016 153* 70 - 140 mg/dl Final  . BUN 03/07/2016 31.5* 7.0 - 26.0 mg/dL Final  . Creatinine 03/07/2016 1.2* 0.6 - 1.1 mg/dL Final  . Total Bilirubin 03/07/2016 0.70  0.20 - 1.20 mg/dL Final  . Alkaline Phosphatase 03/07/2016 45  40 - 150 U/L Final  . AST 03/07/2016 22  5 - 34  U/L Final  . ALT 03/07/2016 20  0 - 55 U/L Final  . Total Protein 03/07/2016 6.6  6.4 - 8.3 g/dL Final  . Albumin 03/07/2016 3.8  3.5 - 5.0 g/dL Final  . Calcium 03/07/2016 9.5  8.4 - 10.4 mg/dL Final  . Anion Gap 03/07/2016 11  3 - 11 mEq/L Final  . EGFR 03/07/2016 54* >90 ml/min/1.73 m2 Final     DIAGNOSTIC IMAGING:  Most recent mammogram:     ASSESSMENT AND PLAN:  Ms.. Kendrick is a pleasant 66 y.o. female with history of Stage IIA right breast invasive ductal carcinoma, ER-/PR-/HER2-, diagnosed in 2014, treated with  lumpectomy, adjuvant chemotherapy, and adjuvant radiation therapy.  [facility] for surveillance and routine follow-up.   1. History of breast cancer:  Ms. Donn is currently clinically and radiographically without evidence of disease or recurrence of breast cancer. She will be due for mammogram in 12/2016; orders placed today.  .  I encouraged her to call me with any questions or concerns before her next visit at the cancer center, and I would be happy to see her sooner, if needed.     2. Bone health:  Given Ms. Mandrell's age and history of breast cancer, she is at risk for bone demineralization. She was encouraged to follow up with her PCP for management of her bone density testing.  In the meantime, she was encouraged to continue with consumption of foods rich in calcium, as well as increase her weight-bearing activities.  She was given education on specific food and activities to promote bone health.  3. Cancer screening:  Due to Ms. Plaia's history and her age, she should receive screening for skin cancers, colon cancer, and gynecologic cancers. She was encouraged to follow-up with her PCP for appropriate cancer screenings.   4. Health maintenance and wellness promotion: Ms. Feeser was encouraged to consume 5-7 servings of fruits and vegetables per day. She was also encouraged to engage in moderate to vigorous exercise for 30 minutes per day most days of the week. She was instructed to limit her alcohol consumption and continue to abstain from tobacco use.    Dispo:  -Return to cancer center in one year for follow up.   A total of (30) minutes of face-to-face time was spent with this patient with greater than 50% of that time in counseling and care-coordination.   Charlestine Massed, NP Survivorship [facility] [place] [phone]   Note: PRIMARY CARE PROVIDER No primary care provider on file. None None

## 2016-03-06 ENCOUNTER — Other Ambulatory Visit: Payer: Self-pay | Admitting: Adult Health

## 2016-03-06 DIAGNOSIS — H401112 Primary open-angle glaucoma, right eye, moderate stage: Secondary | ICD-10-CM | POA: Diagnosis not present

## 2016-03-06 DIAGNOSIS — C50511 Malignant neoplasm of lower-outer quadrant of right female breast: Secondary | ICD-10-CM

## 2016-03-06 DIAGNOSIS — E113591 Type 2 diabetes mellitus with proliferative diabetic retinopathy without macular edema, right eye: Secondary | ICD-10-CM | POA: Diagnosis not present

## 2016-03-06 DIAGNOSIS — H402223 Chronic angle-closure glaucoma, left eye, severe stage: Secondary | ICD-10-CM | POA: Diagnosis not present

## 2016-03-06 DIAGNOSIS — H26491 Other secondary cataract, right eye: Secondary | ICD-10-CM | POA: Diagnosis not present

## 2016-03-07 ENCOUNTER — Other Ambulatory Visit (HOSPITAL_BASED_OUTPATIENT_CLINIC_OR_DEPARTMENT_OTHER): Payer: Medicare Other

## 2016-03-07 ENCOUNTER — Encounter: Payer: Medicare Other | Admitting: Adult Health

## 2016-03-07 ENCOUNTER — Other Ambulatory Visit: Payer: Self-pay | Admitting: Cardiovascular Disease

## 2016-03-07 ENCOUNTER — Encounter: Payer: Self-pay | Admitting: Adult Health

## 2016-03-07 ENCOUNTER — Ambulatory Visit: Payer: Medicare Other | Admitting: Nurse Practitioner

## 2016-03-07 ENCOUNTER — Ambulatory Visit (HOSPITAL_BASED_OUTPATIENT_CLINIC_OR_DEPARTMENT_OTHER): Payer: Medicare Other | Admitting: Adult Health

## 2016-03-07 VITALS — BP 139/76 | HR 116 | Temp 97.8°F | Resp 18 | Ht 65.0 in | Wt 186.4 lb

## 2016-03-07 DIAGNOSIS — C50911 Malignant neoplasm of unspecified site of right female breast: Secondary | ICD-10-CM

## 2016-03-07 DIAGNOSIS — Z171 Estrogen receptor negative status [ER-]: Secondary | ICD-10-CM | POA: Diagnosis not present

## 2016-03-07 DIAGNOSIS — C50511 Malignant neoplasm of lower-outer quadrant of right female breast: Secondary | ICD-10-CM

## 2016-03-07 DIAGNOSIS — Z940 Kidney transplant status: Secondary | ICD-10-CM | POA: Diagnosis not present

## 2016-03-07 LAB — CBC WITH DIFFERENTIAL/PLATELET
BASO%: 0.4 % (ref 0.0–2.0)
BASOS ABS: 0 10*3/uL (ref 0.0–0.1)
EOS ABS: 0.1 10*3/uL (ref 0.0–0.5)
EOS%: 1.2 % (ref 0.0–7.0)
HEMATOCRIT: 44.4 % (ref 34.8–46.6)
HEMOGLOBIN: 13.6 g/dL (ref 11.6–15.9)
LYMPH#: 0.9 10*3/uL (ref 0.9–3.3)
LYMPH%: 16.1 % (ref 14.0–49.7)
MCH: 27.9 pg (ref 25.1–34.0)
MCHC: 30.6 g/dL — AB (ref 31.5–36.0)
MCV: 91 fL (ref 79.5–101.0)
MONO#: 0.4 10*3/uL (ref 0.1–0.9)
MONO%: 6.5 % (ref 0.0–14.0)
NEUT#: 4.3 10*3/uL (ref 1.5–6.5)
NEUT%: 75.8 % (ref 38.4–76.8)
Platelets: 173 10*3/uL (ref 145–400)
RBC: 4.88 10*6/uL (ref 3.70–5.45)
RDW: 15.7 % — AB (ref 11.2–14.5)
WBC: 5.7 10*3/uL (ref 3.9–10.3)

## 2016-03-07 LAB — COMPREHENSIVE METABOLIC PANEL WITH GFR
ALT: 20 U/L (ref 0–55)
AST: 22 U/L (ref 5–34)
Albumin: 3.8 g/dL (ref 3.5–5.0)
Alkaline Phosphatase: 45 U/L (ref 40–150)
Anion Gap: 11 meq/L (ref 3–11)
BUN: 31.5 mg/dL — ABNORMAL HIGH (ref 7.0–26.0)
CO2: 31 meq/L — ABNORMAL HIGH (ref 22–29)
Calcium: 9.5 mg/dL (ref 8.4–10.4)
Chloride: 102 meq/L (ref 98–109)
Creatinine: 1.2 mg/dL — ABNORMAL HIGH (ref 0.6–1.1)
EGFR: 54 mL/min/{1.73_m2} — ABNORMAL LOW
Glucose: 153 mg/dL — ABNORMAL HIGH (ref 70–140)
Potassium: 3.4 meq/L — ABNORMAL LOW (ref 3.5–5.1)
Sodium: 144 meq/L (ref 136–145)
Total Bilirubin: 0.7 mg/dL (ref 0.20–1.20)
Total Protein: 6.6 g/dL (ref 6.4–8.3)

## 2016-03-07 MED ORDER — ISOSORBIDE MONONITRATE ER 60 MG PO TB24: ORAL_TABLET | ORAL | 0 refills | Status: AC

## 2016-03-09 ENCOUNTER — Other Ambulatory Visit: Payer: Self-pay | Admitting: Internal Medicine

## 2016-03-14 DIAGNOSIS — Z940 Kidney transplant status: Secondary | ICD-10-CM | POA: Diagnosis not present

## 2016-03-20 DIAGNOSIS — R252 Cramp and spasm: Secondary | ICD-10-CM | POA: Diagnosis not present

## 2016-03-20 DIAGNOSIS — Z794 Long term (current) use of insulin: Secondary | ICD-10-CM | POA: Diagnosis not present

## 2016-03-20 DIAGNOSIS — E1129 Type 2 diabetes mellitus with other diabetic kidney complication: Secondary | ICD-10-CM | POA: Diagnosis not present

## 2016-03-20 DIAGNOSIS — Z7689 Persons encountering health services in other specified circumstances: Secondary | ICD-10-CM | POA: Diagnosis not present

## 2016-03-24 ENCOUNTER — Other Ambulatory Visit: Payer: Self-pay | Admitting: Cardiovascular Disease

## 2016-03-26 ENCOUNTER — Other Ambulatory Visit: Payer: Self-pay | Admitting: Cardiovascular Disease

## 2016-03-29 ENCOUNTER — Other Ambulatory Visit: Payer: Self-pay | Admitting: Cardiovascular Disease

## 2016-03-29 DIAGNOSIS — Z940 Kidney transplant status: Secondary | ICD-10-CM | POA: Diagnosis not present

## 2016-04-05 ENCOUNTER — Telehealth: Payer: Self-pay | Admitting: Oncology

## 2016-04-05 NOTE — Telephone Encounter (Signed)
Faxed records to [facility] [phone]

## 2016-04-08 DIAGNOSIS — I313 Pericardial effusion (noninflammatory): Secondary | ICD-10-CM | POA: Diagnosis not present

## 2016-04-08 DIAGNOSIS — E1122 Type 2 diabetes mellitus with diabetic chronic kidney disease: Secondary | ICD-10-CM | POA: Diagnosis not present

## 2016-04-08 DIAGNOSIS — I429 Cardiomyopathy, unspecified: Secondary | ICD-10-CM | POA: Diagnosis not present

## 2016-04-08 DIAGNOSIS — N186 End stage renal disease: Secondary | ICD-10-CM | POA: Diagnosis not present

## 2016-04-08 DIAGNOSIS — Z794 Long term (current) use of insulin: Secondary | ICD-10-CM | POA: Diagnosis not present

## 2016-04-08 DIAGNOSIS — I10 Essential (primary) hypertension: Secondary | ICD-10-CM | POA: Diagnosis not present

## 2016-04-08 DIAGNOSIS — E162 Hypoglycemia, unspecified: Secondary | ICD-10-CM | POA: Diagnosis not present

## 2016-04-09 DIAGNOSIS — I214 Non-ST elevation (NSTEMI) myocardial infarction: Secondary | ICD-10-CM | POA: Diagnosis not present

## 2016-04-09 DIAGNOSIS — R609 Edema, unspecified: Secondary | ICD-10-CM | POA: Diagnosis not present

## 2016-04-09 DIAGNOSIS — I119 Hypertensive heart disease without heart failure: Secondary | ICD-10-CM | POA: Diagnosis not present

## 2016-04-09 DIAGNOSIS — G4733 Obstructive sleep apnea (adult) (pediatric): Secondary | ICD-10-CM | POA: Diagnosis not present

## 2016-04-09 DIAGNOSIS — I313 Pericardial effusion (noninflammatory): Secondary | ICD-10-CM | POA: Diagnosis not present

## 2016-04-09 DIAGNOSIS — I5032 Chronic diastolic (congestive) heart failure: Secondary | ICD-10-CM | POA: Diagnosis not present

## 2016-04-09 DIAGNOSIS — I10 Essential (primary) hypertension: Secondary | ICD-10-CM | POA: Diagnosis not present

## 2016-04-09 DIAGNOSIS — N189 Chronic kidney disease, unspecified: Secondary | ICD-10-CM | POA: Diagnosis not present

## 2016-04-09 DIAGNOSIS — I25119 Atherosclerotic heart disease of native coronary artery with unspecified angina pectoris: Secondary | ICD-10-CM | POA: Diagnosis not present

## 2016-04-11 DIAGNOSIS — Z940 Kidney transplant status: Secondary | ICD-10-CM | POA: Diagnosis not present

## 2016-04-12 ENCOUNTER — Other Ambulatory Visit: Payer: Self-pay | Admitting: Cardiovascular Disease

## 2016-04-15 DIAGNOSIS — I10 Essential (primary) hypertension: Secondary | ICD-10-CM | POA: Diagnosis not present

## 2016-04-15 DIAGNOSIS — E1122 Type 2 diabetes mellitus with diabetic chronic kidney disease: Secondary | ICD-10-CM | POA: Diagnosis not present

## 2016-04-18 DIAGNOSIS — M545 Low back pain: Secondary | ICD-10-CM | POA: Diagnosis not present

## 2016-04-18 DIAGNOSIS — E790 Hyperuricemia without signs of inflammatory arthritis and tophaceous disease: Secondary | ICD-10-CM | POA: Diagnosis not present

## 2016-04-18 DIAGNOSIS — M0579 Rheumatoid arthritis with rheumatoid factor of multiple sites without organ or systems involvement: Secondary | ICD-10-CM | POA: Diagnosis not present

## 2016-04-18 DIAGNOSIS — E559 Vitamin D deficiency, unspecified: Secondary | ICD-10-CM | POA: Diagnosis not present

## 2016-04-18 DIAGNOSIS — Z79899 Other long term (current) drug therapy: Secondary | ICD-10-CM | POA: Diagnosis not present

## 2016-04-18 DIAGNOSIS — M542 Cervicalgia: Secondary | ICD-10-CM | POA: Diagnosis not present

## 2016-04-22 ENCOUNTER — Other Ambulatory Visit: Payer: Self-pay | Admitting: Cardiovascular Disease

## 2016-04-22 DIAGNOSIS — K5900 Constipation, unspecified: Secondary | ICD-10-CM | POA: Diagnosis not present

## 2016-04-22 DIAGNOSIS — Z86010 Personal history of colonic polyps: Secondary | ICD-10-CM | POA: Diagnosis not present

## 2016-04-22 DIAGNOSIS — K279 Peptic ulcer, site unspecified, unspecified as acute or chronic, without hemorrhage or perforation: Secondary | ICD-10-CM | POA: Diagnosis not present

## 2016-04-23 DIAGNOSIS — E1122 Type 2 diabetes mellitus with diabetic chronic kidney disease: Secondary | ICD-10-CM | POA: Diagnosis not present

## 2016-04-23 DIAGNOSIS — R05 Cough: Secondary | ICD-10-CM | POA: Diagnosis not present

## 2016-04-26 DIAGNOSIS — Z940 Kidney transplant status: Secondary | ICD-10-CM | POA: Diagnosis not present

## 2016-04-30 DIAGNOSIS — R05 Cough: Secondary | ICD-10-CM | POA: Diagnosis not present

## 2016-04-30 DIAGNOSIS — E1122 Type 2 diabetes mellitus with diabetic chronic kidney disease: Secondary | ICD-10-CM | POA: Diagnosis not present

## 2016-04-30 DIAGNOSIS — Z13820 Encounter for screening for osteoporosis: Secondary | ICD-10-CM | POA: Diagnosis not present

## 2016-05-08 DIAGNOSIS — E1122 Type 2 diabetes mellitus with diabetic chronic kidney disease: Secondary | ICD-10-CM | POA: Diagnosis not present

## 2016-05-09 DIAGNOSIS — K529 Noninfective gastroenteritis and colitis, unspecified: Secondary | ICD-10-CM | POA: Diagnosis not present

## 2016-05-09 DIAGNOSIS — K635 Polyp of colon: Secondary | ICD-10-CM | POA: Diagnosis not present

## 2016-05-09 DIAGNOSIS — D371 Neoplasm of uncertain behavior of stomach: Secondary | ICD-10-CM | POA: Diagnosis not present

## 2016-05-09 DIAGNOSIS — Z1211 Encounter for screening for malignant neoplasm of colon: Secondary | ICD-10-CM | POA: Diagnosis not present

## 2016-05-17 DIAGNOSIS — M109 Gout, unspecified: Secondary | ICD-10-CM | POA: Diagnosis not present

## 2016-05-17 DIAGNOSIS — E118 Type 2 diabetes mellitus with unspecified complications: Secondary | ICD-10-CM | POA: Diagnosis not present

## 2016-05-17 DIAGNOSIS — I129 Hypertensive chronic kidney disease with stage 1 through stage 4 chronic kidney disease, or unspecified chronic kidney disease: Secondary | ICD-10-CM | POA: Diagnosis not present

## 2016-05-17 DIAGNOSIS — N2581 Secondary hyperparathyroidism of renal origin: Secondary | ICD-10-CM | POA: Diagnosis not present

## 2016-05-17 DIAGNOSIS — N183 Chronic kidney disease, stage 3 (moderate): Secondary | ICD-10-CM | POA: Diagnosis not present

## 2016-05-17 DIAGNOSIS — E1122 Type 2 diabetes mellitus with diabetic chronic kidney disease: Secondary | ICD-10-CM | POA: Diagnosis not present

## 2016-05-17 DIAGNOSIS — Z940 Kidney transplant status: Secondary | ICD-10-CM | POA: Diagnosis not present

## 2016-05-21 ENCOUNTER — Other Ambulatory Visit: Payer: Self-pay | Admitting: Cardiovascular Disease

## 2016-05-24 DIAGNOSIS — Z1211 Encounter for screening for malignant neoplasm of colon: Secondary | ICD-10-CM | POA: Diagnosis not present

## 2016-05-24 DIAGNOSIS — K5900 Constipation, unspecified: Secondary | ICD-10-CM | POA: Diagnosis not present

## 2016-05-24 DIAGNOSIS — K635 Polyp of colon: Secondary | ICD-10-CM | POA: Diagnosis not present

## 2016-05-24 DIAGNOSIS — K5731 Diverticulosis of large intestine without perforation or abscess with bleeding: Secondary | ICD-10-CM | POA: Diagnosis not present

## 2016-05-30 DIAGNOSIS — N183 Chronic kidney disease, stage 3 (moderate): Secondary | ICD-10-CM | POA: Diagnosis not present

## 2016-06-05 DIAGNOSIS — R0981 Nasal congestion: Secondary | ICD-10-CM | POA: Diagnosis not present

## 2016-06-05 DIAGNOSIS — J0120 Acute ethmoidal sinusitis, unspecified: Secondary | ICD-10-CM | POA: Diagnosis not present

## 2016-06-05 DIAGNOSIS — R51 Headache: Secondary | ICD-10-CM | POA: Diagnosis not present

## 2016-06-11 ENCOUNTER — Other Ambulatory Visit: Payer: Self-pay | Admitting: Cardiovascular Disease

## 2016-07-11 DIAGNOSIS — E790 Hyperuricemia without signs of inflammatory arthritis and tophaceous disease: Secondary | ICD-10-CM | POA: Diagnosis not present

## 2016-07-11 DIAGNOSIS — M8580 Other specified disorders of bone density and structure, unspecified site: Secondary | ICD-10-CM | POA: Diagnosis not present

## 2016-07-11 DIAGNOSIS — E559 Vitamin D deficiency, unspecified: Secondary | ICD-10-CM | POA: Diagnosis not present

## 2016-07-11 DIAGNOSIS — Z79899 Other long term (current) drug therapy: Secondary | ICD-10-CM | POA: Diagnosis not present

## 2016-07-11 DIAGNOSIS — M0579 Rheumatoid arthritis with rheumatoid factor of multiple sites without organ or systems involvement: Secondary | ICD-10-CM | POA: Diagnosis not present

## 2016-07-29 DIAGNOSIS — N189 Chronic kidney disease, unspecified: Secondary | ICD-10-CM | POA: Diagnosis not present

## 2016-07-29 DIAGNOSIS — Z940 Kidney transplant status: Secondary | ICD-10-CM | POA: Diagnosis not present

## 2016-07-29 DIAGNOSIS — I130 Hypertensive heart and chronic kidney disease with heart failure and stage 1 through stage 4 chronic kidney disease, or unspecified chronic kidney disease: Secondary | ICD-10-CM | POA: Diagnosis not present

## 2016-07-29 DIAGNOSIS — E1122 Type 2 diabetes mellitus with diabetic chronic kidney disease: Secondary | ICD-10-CM | POA: Diagnosis not present

## 2016-07-29 DIAGNOSIS — L02211 Cutaneous abscess of abdominal wall: Secondary | ICD-10-CM | POA: Diagnosis not present

## 2016-07-29 DIAGNOSIS — I509 Heart failure, unspecified: Secondary | ICD-10-CM | POA: Diagnosis not present

## 2016-07-29 DIAGNOSIS — Z794 Long term (current) use of insulin: Secondary | ICD-10-CM | POA: Diagnosis not present

## 2016-07-29 DIAGNOSIS — Z881 Allergy status to other antibiotic agents status: Secondary | ICD-10-CM | POA: Diagnosis not present

## 2016-07-31 DIAGNOSIS — Z4801 Encounter for change or removal of surgical wound dressing: Secondary | ICD-10-CM | POA: Diagnosis not present

## 2016-07-31 DIAGNOSIS — L02211 Cutaneous abscess of abdominal wall: Secondary | ICD-10-CM | POA: Diagnosis not present

## 2016-08-08 DIAGNOSIS — L02211 Cutaneous abscess of abdominal wall: Secondary | ICD-10-CM | POA: Diagnosis not present

## 2016-08-16 DIAGNOSIS — E8770 Fluid overload, unspecified: Secondary | ICD-10-CM | POA: Diagnosis not present

## 2016-08-16 DIAGNOSIS — I25119 Atherosclerotic heart disease of native coronary artery with unspecified angina pectoris: Secondary | ICD-10-CM | POA: Diagnosis not present

## 2016-08-16 DIAGNOSIS — E118 Type 2 diabetes mellitus with unspecified complications: Secondary | ICD-10-CM | POA: Diagnosis not present

## 2016-08-16 DIAGNOSIS — I129 Hypertensive chronic kidney disease with stage 1 through stage 4 chronic kidney disease, or unspecified chronic kidney disease: Secondary | ICD-10-CM | POA: Diagnosis not present

## 2016-08-16 DIAGNOSIS — N2581 Secondary hyperparathyroidism of renal origin: Secondary | ICD-10-CM | POA: Diagnosis not present

## 2016-08-16 DIAGNOSIS — E785 Hyperlipidemia, unspecified: Secondary | ICD-10-CM | POA: Diagnosis not present

## 2016-08-16 DIAGNOSIS — Z940 Kidney transplant status: Secondary | ICD-10-CM | POA: Diagnosis not present

## 2016-08-16 DIAGNOSIS — M109 Gout, unspecified: Secondary | ICD-10-CM | POA: Diagnosis not present

## 2016-08-16 DIAGNOSIS — E1122 Type 2 diabetes mellitus with diabetic chronic kidney disease: Secondary | ICD-10-CM | POA: Diagnosis not present

## 2016-08-16 DIAGNOSIS — N183 Chronic kidney disease, stage 3 (moderate): Secondary | ICD-10-CM | POA: Diagnosis not present

## 2016-08-21 DIAGNOSIS — Z940 Kidney transplant status: Secondary | ICD-10-CM | POA: Diagnosis not present

## 2016-08-29 DIAGNOSIS — L02211 Cutaneous abscess of abdominal wall: Secondary | ICD-10-CM | POA: Diagnosis not present

## 2016-09-09 DIAGNOSIS — Z940 Kidney transplant status: Secondary | ICD-10-CM | POA: Diagnosis not present

## 2016-09-19 DIAGNOSIS — L02211 Cutaneous abscess of abdominal wall: Secondary | ICD-10-CM | POA: Diagnosis not present

## 2016-10-02 DIAGNOSIS — Z794 Long term (current) use of insulin: Secondary | ICD-10-CM | POA: Diagnosis not present

## 2016-10-02 DIAGNOSIS — E039 Hypothyroidism, unspecified: Secondary | ICD-10-CM | POA: Diagnosis not present

## 2016-10-02 DIAGNOSIS — E119 Type 2 diabetes mellitus without complications: Secondary | ICD-10-CM | POA: Diagnosis not present

## 2016-10-02 DIAGNOSIS — R0981 Nasal congestion: Secondary | ICD-10-CM | POA: Diagnosis not present

## 2016-10-02 DIAGNOSIS — R05 Cough: Secondary | ICD-10-CM | POA: Diagnosis not present

## 2016-10-03 DIAGNOSIS — Z87828 Personal history of other (healed) physical injury and trauma: Secondary | ICD-10-CM | POA: Diagnosis not present

## 2016-10-06 DIAGNOSIS — G40901 Epilepsy, unspecified, not intractable, with status epilepticus: Secondary | ICD-10-CM | POA: Diagnosis not present

## 2016-10-06 DIAGNOSIS — N189 Chronic kidney disease, unspecified: Secondary | ICD-10-CM | POA: Diagnosis not present

## 2016-10-06 DIAGNOSIS — E1065 Type 1 diabetes mellitus with hyperglycemia: Secondary | ICD-10-CM | POA: Diagnosis not present

## 2016-10-06 DIAGNOSIS — E1122 Type 2 diabetes mellitus with diabetic chronic kidney disease: Secondary | ICD-10-CM | POA: Diagnosis not present

## 2016-10-06 DIAGNOSIS — E0865 Diabetes mellitus due to underlying condition with hyperglycemia: Secondary | ICD-10-CM | POA: Diagnosis not present

## 2016-10-06 DIAGNOSIS — E1165 Type 2 diabetes mellitus with hyperglycemia: Secondary | ICD-10-CM | POA: Diagnosis not present

## 2016-10-06 DIAGNOSIS — R7309 Other abnormal glucose: Secondary | ICD-10-CM | POA: Diagnosis not present

## 2016-10-06 DIAGNOSIS — Z794 Long term (current) use of insulin: Secondary | ICD-10-CM | POA: Diagnosis not present

## 2016-10-06 DIAGNOSIS — Z940 Kidney transplant status: Secondary | ICD-10-CM | POA: Diagnosis not present

## 2016-10-06 DIAGNOSIS — R569 Unspecified convulsions: Secondary | ICD-10-CM | POA: Diagnosis not present

## 2016-10-06 DIAGNOSIS — I509 Heart failure, unspecified: Secondary | ICD-10-CM | POA: Diagnosis not present

## 2016-10-06 DIAGNOSIS — I130 Hypertensive heart and chronic kidney disease with heart failure and stage 1 through stage 4 chronic kidney disease, or unspecified chronic kidney disease: Secondary | ICD-10-CM | POA: Diagnosis not present

## 2016-10-07 DIAGNOSIS — R069 Unspecified abnormalities of breathing: Secondary | ICD-10-CM | POA: Diagnosis not present

## 2016-10-07 DIAGNOSIS — R739 Hyperglycemia, unspecified: Secondary | ICD-10-CM | POA: Diagnosis not present

## 2016-10-07 DIAGNOSIS — G9340 Encephalopathy, unspecified: Secondary | ICD-10-CM | POA: Diagnosis not present

## 2016-10-07 DIAGNOSIS — I63511 Cerebral infarction due to unspecified occlusion or stenosis of right middle cerebral artery: Secondary | ICD-10-CM | POA: Diagnosis not present

## 2016-10-07 DIAGNOSIS — J45909 Unspecified asthma, uncomplicated: Secondary | ICD-10-CM | POA: Diagnosis present

## 2016-10-07 DIAGNOSIS — E1110 Type 2 diabetes mellitus with ketoacidosis without coma: Secondary | ICD-10-CM | POA: Diagnosis not present

## 2016-10-07 DIAGNOSIS — Z794 Long term (current) use of insulin: Secondary | ICD-10-CM | POA: Diagnosis not present

## 2016-10-07 DIAGNOSIS — M109 Gout, unspecified: Secondary | ICD-10-CM | POA: Diagnosis not present

## 2016-10-07 DIAGNOSIS — R262 Difficulty in walking, not elsewhere classified: Secondary | ICD-10-CM | POA: Diagnosis not present

## 2016-10-07 DIAGNOSIS — Z940 Kidney transplant status: Secondary | ICD-10-CM | POA: Diagnosis not present

## 2016-10-07 DIAGNOSIS — J14 Pneumonia due to Hemophilus influenzae: Secondary | ICD-10-CM | POA: Diagnosis not present

## 2016-10-07 DIAGNOSIS — M7989 Other specified soft tissue disorders: Secondary | ICD-10-CM | POA: Diagnosis not present

## 2016-10-07 DIAGNOSIS — Z86010 Personal history of colonic polyps: Secondary | ICD-10-CM | POA: Diagnosis not present

## 2016-10-07 DIAGNOSIS — I130 Hypertensive heart and chronic kidney disease with heart failure and stage 1 through stage 4 chronic kidney disease, or unspecified chronic kidney disease: Secondary | ICD-10-CM | POA: Diagnosis not present

## 2016-10-07 DIAGNOSIS — I5032 Chronic diastolic (congestive) heart failure: Secondary | ICD-10-CM | POA: Diagnosis not present

## 2016-10-07 DIAGNOSIS — R4182 Altered mental status, unspecified: Secondary | ICD-10-CM | POA: Diagnosis not present

## 2016-10-07 DIAGNOSIS — M25531 Pain in right wrist: Secondary | ICD-10-CM | POA: Diagnosis not present

## 2016-10-07 DIAGNOSIS — E1122 Type 2 diabetes mellitus with diabetic chronic kidney disease: Secondary | ICD-10-CM | POA: Diagnosis not present

## 2016-10-07 DIAGNOSIS — N189 Chronic kidney disease, unspecified: Secondary | ICD-10-CM | POA: Diagnosis not present

## 2016-10-07 DIAGNOSIS — G4700 Insomnia, unspecified: Secondary | ICD-10-CM | POA: Diagnosis not present

## 2016-10-07 DIAGNOSIS — I252 Old myocardial infarction: Secondary | ICD-10-CM | POA: Diagnosis not present

## 2016-10-07 DIAGNOSIS — M791 Myalgia, unspecified site: Secondary | ICD-10-CM | POA: Diagnosis not present

## 2016-10-07 DIAGNOSIS — I2510 Atherosclerotic heart disease of native coronary artery without angina pectoris: Secondary | ICD-10-CM | POA: Diagnosis present

## 2016-10-07 DIAGNOSIS — M25532 Pain in left wrist: Secondary | ICD-10-CM | POA: Diagnosis not present

## 2016-10-07 DIAGNOSIS — Z7952 Long term (current) use of systemic steroids: Secondary | ICD-10-CM | POA: Diagnosis not present

## 2016-10-07 DIAGNOSIS — I509 Heart failure, unspecified: Secondary | ICD-10-CM | POA: Diagnosis not present

## 2016-10-07 DIAGNOSIS — E7849 Other hyperlipidemia: Secondary | ICD-10-CM | POA: Diagnosis not present

## 2016-10-07 DIAGNOSIS — G40909 Epilepsy, unspecified, not intractable, without status epilepticus: Secondary | ICD-10-CM | POA: Diagnosis not present

## 2016-10-07 DIAGNOSIS — E039 Hypothyroidism, unspecified: Secondary | ICD-10-CM | POA: Diagnosis not present

## 2016-10-07 DIAGNOSIS — M6281 Muscle weakness (generalized): Secondary | ICD-10-CM | POA: Diagnosis not present

## 2016-10-07 DIAGNOSIS — R569 Unspecified convulsions: Secondary | ICD-10-CM | POA: Diagnosis not present

## 2016-10-07 DIAGNOSIS — J9601 Acute respiratory failure with hypoxia: Secondary | ICD-10-CM | POA: Diagnosis present

## 2016-10-07 DIAGNOSIS — Z9011 Acquired absence of right breast and nipple: Secondary | ICD-10-CM | POA: Diagnosis not present

## 2016-10-07 DIAGNOSIS — Z79899 Other long term (current) drug therapy: Secondary | ICD-10-CM | POA: Diagnosis not present

## 2016-10-07 DIAGNOSIS — E1165 Type 2 diabetes mellitus with hyperglycemia: Secondary | ICD-10-CM | POA: Diagnosis present

## 2016-10-07 DIAGNOSIS — I517 Cardiomegaly: Secondary | ICD-10-CM | POA: Diagnosis not present

## 2016-10-07 DIAGNOSIS — Z90710 Acquired absence of both cervix and uterus: Secondary | ICD-10-CM | POA: Diagnosis not present

## 2016-10-07 DIAGNOSIS — I10 Essential (primary) hypertension: Secondary | ICD-10-CM | POA: Diagnosis not present

## 2016-10-07 DIAGNOSIS — Z853 Personal history of malignant neoplasm of breast: Secondary | ICD-10-CM | POA: Diagnosis not present

## 2016-10-07 DIAGNOSIS — Z4682 Encounter for fitting and adjustment of non-vascular catheter: Secondary | ICD-10-CM | POA: Diagnosis not present

## 2016-10-07 DIAGNOSIS — H5440 Blindness, one eye, unspecified eye: Secondary | ICD-10-CM | POA: Diagnosis present

## 2016-10-07 DIAGNOSIS — I209 Angina pectoris, unspecified: Secondary | ICD-10-CM | POA: Diagnosis not present

## 2016-10-07 DIAGNOSIS — E785 Hyperlipidemia, unspecified: Secondary | ICD-10-CM | POA: Diagnosis present

## 2016-10-07 DIAGNOSIS — H409 Unspecified glaucoma: Secondary | ICD-10-CM | POA: Diagnosis not present

## 2016-10-07 DIAGNOSIS — E1151 Type 2 diabetes mellitus with diabetic peripheral angiopathy without gangrene: Secondary | ICD-10-CM | POA: Diagnosis present

## 2016-10-07 DIAGNOSIS — J9600 Acute respiratory failure, unspecified whether with hypoxia or hypercapnia: Secondary | ICD-10-CM | POA: Diagnosis not present

## 2016-10-07 DIAGNOSIS — J449 Chronic obstructive pulmonary disease, unspecified: Secondary | ICD-10-CM | POA: Diagnosis not present

## 2016-10-07 DIAGNOSIS — G40901 Epilepsy, unspecified, not intractable, with status epilepticus: Secondary | ICD-10-CM | POA: Diagnosis not present

## 2016-10-07 DIAGNOSIS — R531 Weakness: Secondary | ICD-10-CM | POA: Diagnosis present

## 2016-10-07 DIAGNOSIS — T466X5A Adverse effect of antihyperlipidemic and antiarteriosclerotic drugs, initial encounter: Secondary | ICD-10-CM | POA: Diagnosis not present

## 2016-10-07 DIAGNOSIS — E1142 Type 2 diabetes mellitus with diabetic polyneuropathy: Secondary | ICD-10-CM | POA: Diagnosis present

## 2016-10-07 DIAGNOSIS — A492 Hemophilus influenzae infection, unspecified site: Secondary | ICD-10-CM | POA: Diagnosis not present

## 2016-10-07 DIAGNOSIS — G9349 Other encephalopathy: Secondary | ICD-10-CM | POA: Diagnosis present

## 2016-10-07 DIAGNOSIS — Z87442 Personal history of urinary calculi: Secondary | ICD-10-CM | POA: Diagnosis not present

## 2016-10-07 DIAGNOSIS — E119 Type 2 diabetes mellitus without complications: Secondary | ICD-10-CM | POA: Diagnosis not present

## 2016-10-07 DIAGNOSIS — I110 Hypertensive heart disease with heart failure: Secondary | ICD-10-CM | POA: Diagnosis present

## 2016-10-16 DIAGNOSIS — M1A0390 Idiopathic chronic gout, unspecified wrist, without tophus (tophi): Secondary | ICD-10-CM | POA: Diagnosis not present

## 2016-10-16 DIAGNOSIS — R739 Hyperglycemia, unspecified: Secondary | ICD-10-CM | POA: Diagnosis not present

## 2016-10-16 DIAGNOSIS — J45909 Unspecified asthma, uncomplicated: Secondary | ICD-10-CM | POA: Diagnosis not present

## 2016-10-16 DIAGNOSIS — M6281 Muscle weakness (generalized): Secondary | ICD-10-CM | POA: Diagnosis not present

## 2016-10-16 DIAGNOSIS — E7849 Other hyperlipidemia: Secondary | ICD-10-CM | POA: Diagnosis not present

## 2016-10-16 DIAGNOSIS — M791 Myalgia, unspecified site: Secondary | ICD-10-CM | POA: Diagnosis not present

## 2016-10-16 DIAGNOSIS — G40901 Epilepsy, unspecified, not intractable, with status epilepticus: Secondary | ICD-10-CM | POA: Diagnosis not present

## 2016-10-16 DIAGNOSIS — J9600 Acute respiratory failure, unspecified whether with hypoxia or hypercapnia: Secondary | ICD-10-CM | POA: Diagnosis not present

## 2016-10-16 DIAGNOSIS — E1110 Type 2 diabetes mellitus with ketoacidosis without coma: Secondary | ICD-10-CM | POA: Diagnosis not present

## 2016-10-16 DIAGNOSIS — H409 Unspecified glaucoma: Secondary | ICD-10-CM | POA: Diagnosis not present

## 2016-10-16 DIAGNOSIS — Z940 Kidney transplant status: Secondary | ICD-10-CM | POA: Diagnosis not present

## 2016-10-16 DIAGNOSIS — R262 Difficulty in walking, not elsewhere classified: Secondary | ICD-10-CM | POA: Diagnosis not present

## 2016-10-16 DIAGNOSIS — E119 Type 2 diabetes mellitus without complications: Secondary | ICD-10-CM | POA: Diagnosis not present

## 2016-10-16 DIAGNOSIS — G4700 Insomnia, unspecified: Secondary | ICD-10-CM | POA: Diagnosis not present

## 2016-10-16 DIAGNOSIS — Z794 Long term (current) use of insulin: Secondary | ICD-10-CM | POA: Diagnosis not present

## 2016-10-16 DIAGNOSIS — R069 Unspecified abnormalities of breathing: Secondary | ICD-10-CM | POA: Diagnosis not present

## 2016-10-16 DIAGNOSIS — R569 Unspecified convulsions: Secondary | ICD-10-CM | POA: Diagnosis not present

## 2016-10-16 DIAGNOSIS — E039 Hypothyroidism, unspecified: Secondary | ICD-10-CM | POA: Diagnosis not present

## 2016-10-16 DIAGNOSIS — I5032 Chronic diastolic (congestive) heart failure: Secondary | ICD-10-CM | POA: Diagnosis not present

## 2016-10-16 DIAGNOSIS — E1165 Type 2 diabetes mellitus with hyperglycemia: Secondary | ICD-10-CM | POA: Diagnosis not present

## 2016-10-16 DIAGNOSIS — T466X5A Adverse effect of antihyperlipidemic and antiarteriosclerotic drugs, initial encounter: Secondary | ICD-10-CM | POA: Diagnosis not present

## 2016-10-16 DIAGNOSIS — I209 Angina pectoris, unspecified: Secondary | ICD-10-CM | POA: Diagnosis not present

## 2016-10-16 DIAGNOSIS — I10 Essential (primary) hypertension: Secondary | ICD-10-CM | POA: Diagnosis not present

## 2016-10-16 DIAGNOSIS — J449 Chronic obstructive pulmonary disease, unspecified: Secondary | ICD-10-CM | POA: Diagnosis not present

## 2016-10-16 DIAGNOSIS — J14 Pneumonia due to Hemophilus influenzae: Secondary | ICD-10-CM | POA: Diagnosis not present

## 2016-10-16 DIAGNOSIS — M109 Gout, unspecified: Secondary | ICD-10-CM | POA: Diagnosis not present

## 2016-10-17 DIAGNOSIS — G40901 Epilepsy, unspecified, not intractable, with status epilepticus: Secondary | ICD-10-CM | POA: Diagnosis not present

## 2016-10-17 DIAGNOSIS — M109 Gout, unspecified: Secondary | ICD-10-CM | POA: Diagnosis not present

## 2016-10-17 DIAGNOSIS — J14 Pneumonia due to Hemophilus influenzae: Secondary | ICD-10-CM | POA: Diagnosis not present

## 2016-10-17 DIAGNOSIS — E1110 Type 2 diabetes mellitus with ketoacidosis without coma: Secondary | ICD-10-CM | POA: Diagnosis not present

## 2016-10-18 DIAGNOSIS — E1110 Type 2 diabetes mellitus with ketoacidosis without coma: Secondary | ICD-10-CM | POA: Diagnosis not present

## 2016-10-18 DIAGNOSIS — E1165 Type 2 diabetes mellitus with hyperglycemia: Secondary | ICD-10-CM | POA: Diagnosis not present

## 2016-10-18 DIAGNOSIS — M791 Myalgia, unspecified site: Secondary | ICD-10-CM | POA: Diagnosis not present

## 2016-10-18 DIAGNOSIS — G40901 Epilepsy, unspecified, not intractable, with status epilepticus: Secondary | ICD-10-CM | POA: Diagnosis not present

## 2016-10-21 DIAGNOSIS — E1165 Type 2 diabetes mellitus with hyperglycemia: Secondary | ICD-10-CM | POA: Diagnosis not present

## 2016-10-28 DIAGNOSIS — M1A0390 Idiopathic chronic gout, unspecified wrist, without tophus (tophi): Secondary | ICD-10-CM | POA: Diagnosis not present

## 2016-10-28 DIAGNOSIS — I10 Essential (primary) hypertension: Secondary | ICD-10-CM | POA: Diagnosis not present

## 2016-10-28 DIAGNOSIS — I5032 Chronic diastolic (congestive) heart failure: Secondary | ICD-10-CM | POA: Diagnosis not present

## 2016-10-28 DIAGNOSIS — E1165 Type 2 diabetes mellitus with hyperglycemia: Secondary | ICD-10-CM | POA: Diagnosis not present

## 2016-11-04 DIAGNOSIS — G40901 Epilepsy, unspecified, not intractable, with status epilepticus: Secondary | ICD-10-CM | POA: Diagnosis not present

## 2016-11-04 DIAGNOSIS — E1110 Type 2 diabetes mellitus with ketoacidosis without coma: Secondary | ICD-10-CM | POA: Diagnosis not present

## 2016-11-04 DIAGNOSIS — J14 Pneumonia due to Hemophilus influenzae: Secondary | ICD-10-CM | POA: Diagnosis not present

## 2016-11-04 DIAGNOSIS — E1165 Type 2 diabetes mellitus with hyperglycemia: Secondary | ICD-10-CM | POA: Diagnosis not present

## 2016-11-11 DIAGNOSIS — E1165 Type 2 diabetes mellitus with hyperglycemia: Secondary | ICD-10-CM | POA: Diagnosis not present

## 2016-11-11 DIAGNOSIS — G40901 Epilepsy, unspecified, not intractable, with status epilepticus: Secondary | ICD-10-CM | POA: Diagnosis not present

## 2016-11-11 DIAGNOSIS — I130 Hypertensive heart and chronic kidney disease with heart failure and stage 1 through stage 4 chronic kidney disease, or unspecified chronic kidney disease: Secondary | ICD-10-CM | POA: Diagnosis not present

## 2016-11-11 DIAGNOSIS — N189 Chronic kidney disease, unspecified: Secondary | ICD-10-CM | POA: Diagnosis not present

## 2016-11-11 DIAGNOSIS — I5032 Chronic diastolic (congestive) heart failure: Secondary | ICD-10-CM | POA: Diagnosis not present

## 2016-11-11 DIAGNOSIS — E1122 Type 2 diabetes mellitus with diabetic chronic kidney disease: Secondary | ICD-10-CM | POA: Diagnosis not present

## 2016-11-12 DIAGNOSIS — N189 Chronic kidney disease, unspecified: Secondary | ICD-10-CM | POA: Diagnosis not present

## 2016-11-12 DIAGNOSIS — G40901 Epilepsy, unspecified, not intractable, with status epilepticus: Secondary | ICD-10-CM | POA: Diagnosis not present

## 2016-11-12 DIAGNOSIS — I5032 Chronic diastolic (congestive) heart failure: Secondary | ICD-10-CM | POA: Diagnosis not present

## 2016-11-12 DIAGNOSIS — I130 Hypertensive heart and chronic kidney disease with heart failure and stage 1 through stage 4 chronic kidney disease, or unspecified chronic kidney disease: Secondary | ICD-10-CM | POA: Diagnosis not present

## 2016-11-12 DIAGNOSIS — E1122 Type 2 diabetes mellitus with diabetic chronic kidney disease: Secondary | ICD-10-CM | POA: Diagnosis not present

## 2016-11-12 DIAGNOSIS — E1165 Type 2 diabetes mellitus with hyperglycemia: Secondary | ICD-10-CM | POA: Diagnosis not present

## 2016-11-13 DIAGNOSIS — N189 Chronic kidney disease, unspecified: Secondary | ICD-10-CM | POA: Diagnosis not present

## 2016-11-13 DIAGNOSIS — I5032 Chronic diastolic (congestive) heart failure: Secondary | ICD-10-CM | POA: Diagnosis not present

## 2016-11-13 DIAGNOSIS — E1122 Type 2 diabetes mellitus with diabetic chronic kidney disease: Secondary | ICD-10-CM | POA: Diagnosis not present

## 2016-11-13 DIAGNOSIS — I130 Hypertensive heart and chronic kidney disease with heart failure and stage 1 through stage 4 chronic kidney disease, or unspecified chronic kidney disease: Secondary | ICD-10-CM | POA: Diagnosis not present

## 2016-11-13 DIAGNOSIS — E1165 Type 2 diabetes mellitus with hyperglycemia: Secondary | ICD-10-CM | POA: Diagnosis not present

## 2016-11-13 DIAGNOSIS — G40901 Epilepsy, unspecified, not intractable, with status epilepticus: Secondary | ICD-10-CM | POA: Diagnosis not present

## 2016-11-13 DIAGNOSIS — Z940 Kidney transplant status: Secondary | ICD-10-CM | POA: Diagnosis not present

## 2016-11-15 DIAGNOSIS — E1122 Type 2 diabetes mellitus with diabetic chronic kidney disease: Secondary | ICD-10-CM | POA: Diagnosis not present

## 2016-11-15 DIAGNOSIS — I5032 Chronic diastolic (congestive) heart failure: Secondary | ICD-10-CM | POA: Diagnosis not present

## 2016-11-15 DIAGNOSIS — I130 Hypertensive heart and chronic kidney disease with heart failure and stage 1 through stage 4 chronic kidney disease, or unspecified chronic kidney disease: Secondary | ICD-10-CM | POA: Diagnosis not present

## 2016-11-15 DIAGNOSIS — N189 Chronic kidney disease, unspecified: Secondary | ICD-10-CM | POA: Diagnosis not present

## 2016-11-15 DIAGNOSIS — G40901 Epilepsy, unspecified, not intractable, with status epilepticus: Secondary | ICD-10-CM | POA: Diagnosis not present

## 2016-11-15 DIAGNOSIS — E1165 Type 2 diabetes mellitus with hyperglycemia: Secondary | ICD-10-CM | POA: Diagnosis not present

## 2016-11-18 DIAGNOSIS — E1122 Type 2 diabetes mellitus with diabetic chronic kidney disease: Secondary | ICD-10-CM | POA: Diagnosis not present

## 2016-11-18 DIAGNOSIS — G40901 Epilepsy, unspecified, not intractable, with status epilepticus: Secondary | ICD-10-CM | POA: Diagnosis not present

## 2016-11-18 DIAGNOSIS — E1165 Type 2 diabetes mellitus with hyperglycemia: Secondary | ICD-10-CM | POA: Diagnosis not present

## 2016-11-18 DIAGNOSIS — N189 Chronic kidney disease, unspecified: Secondary | ICD-10-CM | POA: Diagnosis not present

## 2016-11-18 DIAGNOSIS — I130 Hypertensive heart and chronic kidney disease with heart failure and stage 1 through stage 4 chronic kidney disease, or unspecified chronic kidney disease: Secondary | ICD-10-CM | POA: Diagnosis not present

## 2016-11-18 DIAGNOSIS — I5032 Chronic diastolic (congestive) heart failure: Secondary | ICD-10-CM | POA: Diagnosis not present

## 2016-11-19 DIAGNOSIS — E1165 Type 2 diabetes mellitus with hyperglycemia: Secondary | ICD-10-CM | POA: Diagnosis not present

## 2016-11-19 DIAGNOSIS — I130 Hypertensive heart and chronic kidney disease with heart failure and stage 1 through stage 4 chronic kidney disease, or unspecified chronic kidney disease: Secondary | ICD-10-CM | POA: Diagnosis not present

## 2016-11-19 DIAGNOSIS — G40901 Epilepsy, unspecified, not intractable, with status epilepticus: Secondary | ICD-10-CM | POA: Diagnosis not present

## 2016-11-19 DIAGNOSIS — E1122 Type 2 diabetes mellitus with diabetic chronic kidney disease: Secondary | ICD-10-CM | POA: Diagnosis not present

## 2016-11-19 DIAGNOSIS — N189 Chronic kidney disease, unspecified: Secondary | ICD-10-CM | POA: Diagnosis not present

## 2016-11-19 DIAGNOSIS — I5032 Chronic diastolic (congestive) heart failure: Secondary | ICD-10-CM | POA: Diagnosis not present

## 2016-11-20 DIAGNOSIS — E1122 Type 2 diabetes mellitus with diabetic chronic kidney disease: Secondary | ICD-10-CM | POA: Diagnosis not present

## 2016-11-20 DIAGNOSIS — N189 Chronic kidney disease, unspecified: Secondary | ICD-10-CM | POA: Diagnosis not present

## 2016-11-20 DIAGNOSIS — I130 Hypertensive heart and chronic kidney disease with heart failure and stage 1 through stage 4 chronic kidney disease, or unspecified chronic kidney disease: Secondary | ICD-10-CM | POA: Diagnosis not present

## 2016-11-20 DIAGNOSIS — E1165 Type 2 diabetes mellitus with hyperglycemia: Secondary | ICD-10-CM | POA: Diagnosis not present

## 2016-11-20 DIAGNOSIS — G40901 Epilepsy, unspecified, not intractable, with status epilepticus: Secondary | ICD-10-CM | POA: Diagnosis not present

## 2016-11-20 DIAGNOSIS — I5032 Chronic diastolic (congestive) heart failure: Secondary | ICD-10-CM | POA: Diagnosis not present

## 2016-11-21 DIAGNOSIS — N390 Urinary tract infection, site not specified: Secondary | ICD-10-CM | POA: Diagnosis not present

## 2016-11-21 DIAGNOSIS — E1165 Type 2 diabetes mellitus with hyperglycemia: Secondary | ICD-10-CM | POA: Diagnosis not present

## 2016-11-21 DIAGNOSIS — I130 Hypertensive heart and chronic kidney disease with heart failure and stage 1 through stage 4 chronic kidney disease, or unspecified chronic kidney disease: Secondary | ICD-10-CM | POA: Diagnosis not present

## 2016-11-21 DIAGNOSIS — I5032 Chronic diastolic (congestive) heart failure: Secondary | ICD-10-CM | POA: Diagnosis not present

## 2016-11-21 DIAGNOSIS — E1122 Type 2 diabetes mellitus with diabetic chronic kidney disease: Secondary | ICD-10-CM | POA: Diagnosis not present

## 2016-11-21 DIAGNOSIS — N189 Chronic kidney disease, unspecified: Secondary | ICD-10-CM | POA: Diagnosis not present

## 2016-11-21 DIAGNOSIS — G40901 Epilepsy, unspecified, not intractable, with status epilepticus: Secondary | ICD-10-CM | POA: Diagnosis not present

## 2016-11-22 DIAGNOSIS — I5032 Chronic diastolic (congestive) heart failure: Secondary | ICD-10-CM | POA: Diagnosis not present

## 2016-11-22 DIAGNOSIS — I130 Hypertensive heart and chronic kidney disease with heart failure and stage 1 through stage 4 chronic kidney disease, or unspecified chronic kidney disease: Secondary | ICD-10-CM | POA: Diagnosis not present

## 2016-11-22 DIAGNOSIS — G40901 Epilepsy, unspecified, not intractable, with status epilepticus: Secondary | ICD-10-CM | POA: Diagnosis not present

## 2016-11-22 DIAGNOSIS — E1165 Type 2 diabetes mellitus with hyperglycemia: Secondary | ICD-10-CM | POA: Diagnosis not present

## 2016-11-22 DIAGNOSIS — E1122 Type 2 diabetes mellitus with diabetic chronic kidney disease: Secondary | ICD-10-CM | POA: Diagnosis not present

## 2016-11-22 DIAGNOSIS — N189 Chronic kidney disease, unspecified: Secondary | ICD-10-CM | POA: Diagnosis not present

## 2016-11-25 DIAGNOSIS — G40901 Epilepsy, unspecified, not intractable, with status epilepticus: Secondary | ICD-10-CM | POA: Diagnosis not present

## 2016-11-25 DIAGNOSIS — N189 Chronic kidney disease, unspecified: Secondary | ICD-10-CM | POA: Diagnosis not present

## 2016-11-25 DIAGNOSIS — R569 Unspecified convulsions: Secondary | ICD-10-CM | POA: Diagnosis not present

## 2016-11-25 DIAGNOSIS — G43909 Migraine, unspecified, not intractable, without status migrainosus: Secondary | ICD-10-CM | POA: Diagnosis not present

## 2016-11-25 DIAGNOSIS — I130 Hypertensive heart and chronic kidney disease with heart failure and stage 1 through stage 4 chronic kidney disease, or unspecified chronic kidney disease: Secondary | ICD-10-CM | POA: Diagnosis not present

## 2016-11-25 DIAGNOSIS — E1165 Type 2 diabetes mellitus with hyperglycemia: Secondary | ICD-10-CM | POA: Diagnosis not present

## 2016-11-25 DIAGNOSIS — Z794 Long term (current) use of insulin: Secondary | ICD-10-CM | POA: Diagnosis not present

## 2016-11-25 DIAGNOSIS — E1122 Type 2 diabetes mellitus with diabetic chronic kidney disease: Secondary | ICD-10-CM | POA: Diagnosis not present

## 2016-11-25 DIAGNOSIS — I5032 Chronic diastolic (congestive) heart failure: Secondary | ICD-10-CM | POA: Diagnosis not present

## 2016-11-25 DIAGNOSIS — Z940 Kidney transplant status: Secondary | ICD-10-CM | POA: Diagnosis not present

## 2016-11-25 DIAGNOSIS — Z881 Allergy status to other antibiotic agents status: Secondary | ICD-10-CM | POA: Diagnosis not present

## 2016-11-25 DIAGNOSIS — I509 Heart failure, unspecified: Secondary | ICD-10-CM | POA: Diagnosis not present

## 2016-11-26 DIAGNOSIS — N189 Chronic kidney disease, unspecified: Secondary | ICD-10-CM | POA: Diagnosis not present

## 2016-11-26 DIAGNOSIS — I5032 Chronic diastolic (congestive) heart failure: Secondary | ICD-10-CM | POA: Diagnosis not present

## 2016-11-26 DIAGNOSIS — E1122 Type 2 diabetes mellitus with diabetic chronic kidney disease: Secondary | ICD-10-CM | POA: Diagnosis not present

## 2016-11-26 DIAGNOSIS — I130 Hypertensive heart and chronic kidney disease with heart failure and stage 1 through stage 4 chronic kidney disease, or unspecified chronic kidney disease: Secondary | ICD-10-CM | POA: Diagnosis not present

## 2016-11-26 DIAGNOSIS — G40901 Epilepsy, unspecified, not intractable, with status epilepticus: Secondary | ICD-10-CM | POA: Diagnosis not present

## 2016-11-26 DIAGNOSIS — E1165 Type 2 diabetes mellitus with hyperglycemia: Secondary | ICD-10-CM | POA: Diagnosis not present

## 2016-11-27 DIAGNOSIS — G40901 Epilepsy, unspecified, not intractable, with status epilepticus: Secondary | ICD-10-CM | POA: Diagnosis not present

## 2016-11-27 DIAGNOSIS — I5032 Chronic diastolic (congestive) heart failure: Secondary | ICD-10-CM | POA: Diagnosis not present

## 2016-11-27 DIAGNOSIS — N189 Chronic kidney disease, unspecified: Secondary | ICD-10-CM | POA: Diagnosis not present

## 2016-11-27 DIAGNOSIS — E1165 Type 2 diabetes mellitus with hyperglycemia: Secondary | ICD-10-CM | POA: Diagnosis not present

## 2016-11-27 DIAGNOSIS — I130 Hypertensive heart and chronic kidney disease with heart failure and stage 1 through stage 4 chronic kidney disease, or unspecified chronic kidney disease: Secondary | ICD-10-CM | POA: Diagnosis not present

## 2016-11-27 DIAGNOSIS — E1122 Type 2 diabetes mellitus with diabetic chronic kidney disease: Secondary | ICD-10-CM | POA: Diagnosis not present

## 2016-11-28 DIAGNOSIS — E1165 Type 2 diabetes mellitus with hyperglycemia: Secondary | ICD-10-CM | POA: Diagnosis not present

## 2016-11-28 DIAGNOSIS — E1122 Type 2 diabetes mellitus with diabetic chronic kidney disease: Secondary | ICD-10-CM | POA: Diagnosis not present

## 2016-11-28 DIAGNOSIS — I130 Hypertensive heart and chronic kidney disease with heart failure and stage 1 through stage 4 chronic kidney disease, or unspecified chronic kidney disease: Secondary | ICD-10-CM | POA: Diagnosis not present

## 2016-11-28 DIAGNOSIS — G40901 Epilepsy, unspecified, not intractable, with status epilepticus: Secondary | ICD-10-CM | POA: Diagnosis not present

## 2016-11-28 DIAGNOSIS — I5032 Chronic diastolic (congestive) heart failure: Secondary | ICD-10-CM | POA: Diagnosis not present

## 2016-11-28 DIAGNOSIS — N189 Chronic kidney disease, unspecified: Secondary | ICD-10-CM | POA: Diagnosis not present

## 2016-11-29 DIAGNOSIS — E1165 Type 2 diabetes mellitus with hyperglycemia: Secondary | ICD-10-CM | POA: Diagnosis not present

## 2016-11-29 DIAGNOSIS — I130 Hypertensive heart and chronic kidney disease with heart failure and stage 1 through stage 4 chronic kidney disease, or unspecified chronic kidney disease: Secondary | ICD-10-CM | POA: Diagnosis not present

## 2016-11-29 DIAGNOSIS — G40901 Epilepsy, unspecified, not intractable, with status epilepticus: Secondary | ICD-10-CM | POA: Diagnosis not present

## 2016-11-29 DIAGNOSIS — I5032 Chronic diastolic (congestive) heart failure: Secondary | ICD-10-CM | POA: Diagnosis not present

## 2016-11-29 DIAGNOSIS — N189 Chronic kidney disease, unspecified: Secondary | ICD-10-CM | POA: Diagnosis not present

## 2016-11-29 DIAGNOSIS — E1122 Type 2 diabetes mellitus with diabetic chronic kidney disease: Secondary | ICD-10-CM | POA: Diagnosis not present

## 2016-12-02 DIAGNOSIS — E039 Hypothyroidism, unspecified: Secondary | ICD-10-CM | POA: Diagnosis not present

## 2016-12-02 DIAGNOSIS — R112 Nausea with vomiting, unspecified: Secondary | ICD-10-CM | POA: Diagnosis not present

## 2016-12-02 DIAGNOSIS — R29898 Other symptoms and signs involving the musculoskeletal system: Secondary | ICD-10-CM | POA: Diagnosis not present

## 2016-12-02 DIAGNOSIS — I5032 Chronic diastolic (congestive) heart failure: Secondary | ICD-10-CM | POA: Diagnosis not present

## 2016-12-02 DIAGNOSIS — Z940 Kidney transplant status: Secondary | ICD-10-CM | POA: Diagnosis not present

## 2016-12-02 DIAGNOSIS — I252 Old myocardial infarction: Secondary | ICD-10-CM | POA: Diagnosis not present

## 2016-12-02 DIAGNOSIS — E1122 Type 2 diabetes mellitus with diabetic chronic kidney disease: Secondary | ICD-10-CM | POA: Diagnosis not present

## 2016-12-02 DIAGNOSIS — I130 Hypertensive heart and chronic kidney disease with heart failure and stage 1 through stage 4 chronic kidney disease, or unspecified chronic kidney disease: Secondary | ICD-10-CM | POA: Diagnosis not present

## 2016-12-02 DIAGNOSIS — G40901 Epilepsy, unspecified, not intractable, with status epilepticus: Secondary | ICD-10-CM | POA: Diagnosis not present

## 2016-12-02 DIAGNOSIS — Z23 Encounter for immunization: Secondary | ICD-10-CM | POA: Diagnosis not present

## 2016-12-02 DIAGNOSIS — N189 Chronic kidney disease, unspecified: Secondary | ICD-10-CM | POA: Diagnosis not present

## 2016-12-02 DIAGNOSIS — N186 End stage renal disease: Secondary | ICD-10-CM | POA: Diagnosis not present

## 2016-12-02 DIAGNOSIS — G458 Other transient cerebral ischemic attacks and related syndromes: Secondary | ICD-10-CM | POA: Diagnosis not present

## 2016-12-02 DIAGNOSIS — Z853 Personal history of malignant neoplasm of breast: Secondary | ICD-10-CM | POA: Diagnosis not present

## 2016-12-02 DIAGNOSIS — E1165 Type 2 diabetes mellitus with hyperglycemia: Secondary | ICD-10-CM | POA: Diagnosis not present

## 2016-12-02 DIAGNOSIS — G40309 Generalized idiopathic epilepsy and epileptic syndromes, not intractable, without status epilepticus: Secondary | ICD-10-CM | POA: Diagnosis not present

## 2016-12-02 DIAGNOSIS — I132 Hypertensive heart and chronic kidney disease with heart failure and with stage 5 chronic kidney disease, or end stage renal disease: Secondary | ICD-10-CM | POA: Diagnosis not present

## 2016-12-02 DIAGNOSIS — M6281 Muscle weakness (generalized): Secondary | ICD-10-CM | POA: Diagnosis not present

## 2016-12-02 DIAGNOSIS — G40909 Epilepsy, unspecified, not intractable, without status epilepticus: Secondary | ICD-10-CM | POA: Diagnosis not present

## 2016-12-02 DIAGNOSIS — Z794 Long term (current) use of insulin: Secondary | ICD-10-CM | POA: Diagnosis not present

## 2016-12-02 DIAGNOSIS — M109 Gout, unspecified: Secondary | ICD-10-CM | POA: Diagnosis not present

## 2016-12-02 DIAGNOSIS — I6789 Other cerebrovascular disease: Secondary | ICD-10-CM | POA: Diagnosis not present

## 2016-12-02 DIAGNOSIS — I2510 Atherosclerotic heart disease of native coronary artery without angina pectoris: Secondary | ICD-10-CM | POA: Diagnosis not present

## 2016-12-02 DIAGNOSIS — H5440 Blindness, one eye, unspecified eye: Secondary | ICD-10-CM | POA: Diagnosis not present

## 2016-12-02 DIAGNOSIS — J45909 Unspecified asthma, uncomplicated: Secondary | ICD-10-CM | POA: Diagnosis not present

## 2016-12-02 DIAGNOSIS — R404 Transient alteration of awareness: Secondary | ICD-10-CM | POA: Diagnosis not present

## 2016-12-02 DIAGNOSIS — R531 Weakness: Secondary | ICD-10-CM | POA: Diagnosis not present

## 2016-12-02 DIAGNOSIS — R29818 Other symptoms and signs involving the nervous system: Secondary | ICD-10-CM | POA: Diagnosis not present

## 2016-12-02 DIAGNOSIS — R42 Dizziness and giddiness: Secondary | ICD-10-CM | POA: Diagnosis not present

## 2016-12-02 DIAGNOSIS — E785 Hyperlipidemia, unspecified: Secondary | ICD-10-CM | POA: Diagnosis not present

## 2016-12-03 DIAGNOSIS — I6789 Other cerebrovascular disease: Secondary | ICD-10-CM | POA: Diagnosis not present

## 2016-12-03 DIAGNOSIS — E1165 Type 2 diabetes mellitus with hyperglycemia: Secondary | ICD-10-CM | POA: Diagnosis not present

## 2016-12-03 DIAGNOSIS — G40309 Generalized idiopathic epilepsy and epileptic syndromes, not intractable, without status epilepticus: Secondary | ICD-10-CM | POA: Diagnosis not present

## 2016-12-03 DIAGNOSIS — G458 Other transient cerebral ischemic attacks and related syndromes: Secondary | ICD-10-CM | POA: Diagnosis not present

## 2016-12-03 DIAGNOSIS — G40909 Epilepsy, unspecified, not intractable, without status epilepticus: Secondary | ICD-10-CM | POA: Diagnosis not present

## 2016-12-03 DIAGNOSIS — M6281 Muscle weakness (generalized): Secondary | ICD-10-CM | POA: Diagnosis not present

## 2016-12-03 DIAGNOSIS — N186 End stage renal disease: Secondary | ICD-10-CM | POA: Diagnosis not present

## 2016-12-04 DIAGNOSIS — E1165 Type 2 diabetes mellitus with hyperglycemia: Secondary | ICD-10-CM | POA: Diagnosis not present

## 2016-12-04 DIAGNOSIS — N189 Chronic kidney disease, unspecified: Secondary | ICD-10-CM | POA: Diagnosis not present

## 2016-12-04 DIAGNOSIS — I130 Hypertensive heart and chronic kidney disease with heart failure and stage 1 through stage 4 chronic kidney disease, or unspecified chronic kidney disease: Secondary | ICD-10-CM | POA: Diagnosis not present

## 2016-12-04 DIAGNOSIS — E1122 Type 2 diabetes mellitus with diabetic chronic kidney disease: Secondary | ICD-10-CM | POA: Diagnosis not present

## 2016-12-04 DIAGNOSIS — G40901 Epilepsy, unspecified, not intractable, with status epilepticus: Secondary | ICD-10-CM | POA: Diagnosis not present

## 2016-12-04 DIAGNOSIS — I5032 Chronic diastolic (congestive) heart failure: Secondary | ICD-10-CM | POA: Diagnosis not present

## 2016-12-06 DIAGNOSIS — I5032 Chronic diastolic (congestive) heart failure: Secondary | ICD-10-CM | POA: Diagnosis not present

## 2016-12-06 DIAGNOSIS — I130 Hypertensive heart and chronic kidney disease with heart failure and stage 1 through stage 4 chronic kidney disease, or unspecified chronic kidney disease: Secondary | ICD-10-CM | POA: Diagnosis not present

## 2016-12-06 DIAGNOSIS — G40901 Epilepsy, unspecified, not intractable, with status epilepticus: Secondary | ICD-10-CM | POA: Diagnosis not present

## 2016-12-06 DIAGNOSIS — N189 Chronic kidney disease, unspecified: Secondary | ICD-10-CM | POA: Diagnosis not present

## 2016-12-06 DIAGNOSIS — E1122 Type 2 diabetes mellitus with diabetic chronic kidney disease: Secondary | ICD-10-CM | POA: Diagnosis not present

## 2016-12-06 DIAGNOSIS — E1165 Type 2 diabetes mellitus with hyperglycemia: Secondary | ICD-10-CM | POA: Diagnosis not present

## 2016-12-07 DIAGNOSIS — I130 Hypertensive heart and chronic kidney disease with heart failure and stage 1 through stage 4 chronic kidney disease, or unspecified chronic kidney disease: Secondary | ICD-10-CM | POA: Diagnosis not present

## 2016-12-07 DIAGNOSIS — G40901 Epilepsy, unspecified, not intractable, with status epilepticus: Secondary | ICD-10-CM | POA: Diagnosis not present

## 2016-12-07 DIAGNOSIS — E1165 Type 2 diabetes mellitus with hyperglycemia: Secondary | ICD-10-CM | POA: Diagnosis not present

## 2016-12-07 DIAGNOSIS — N189 Chronic kidney disease, unspecified: Secondary | ICD-10-CM | POA: Diagnosis not present

## 2016-12-07 DIAGNOSIS — E1122 Type 2 diabetes mellitus with diabetic chronic kidney disease: Secondary | ICD-10-CM | POA: Diagnosis not present

## 2016-12-07 DIAGNOSIS — I5032 Chronic diastolic (congestive) heart failure: Secondary | ICD-10-CM | POA: Diagnosis not present

## 2016-12-09 DIAGNOSIS — E1122 Type 2 diabetes mellitus with diabetic chronic kidney disease: Secondary | ICD-10-CM | POA: Diagnosis not present

## 2016-12-09 DIAGNOSIS — M109 Gout, unspecified: Secondary | ICD-10-CM | POA: Diagnosis not present

## 2016-12-09 DIAGNOSIS — N2581 Secondary hyperparathyroidism of renal origin: Secondary | ICD-10-CM | POA: Diagnosis not present

## 2016-12-09 DIAGNOSIS — I25119 Atherosclerotic heart disease of native coronary artery with unspecified angina pectoris: Secondary | ICD-10-CM | POA: Diagnosis not present

## 2016-12-09 DIAGNOSIS — E118 Type 2 diabetes mellitus with unspecified complications: Secondary | ICD-10-CM | POA: Diagnosis not present

## 2016-12-09 DIAGNOSIS — I129 Hypertensive chronic kidney disease with stage 1 through stage 4 chronic kidney disease, or unspecified chronic kidney disease: Secondary | ICD-10-CM | POA: Diagnosis not present

## 2016-12-09 DIAGNOSIS — E785 Hyperlipidemia, unspecified: Secondary | ICD-10-CM | POA: Diagnosis not present

## 2016-12-09 DIAGNOSIS — E8770 Fluid overload, unspecified: Secondary | ICD-10-CM | POA: Diagnosis not present

## 2016-12-09 DIAGNOSIS — N183 Chronic kidney disease, stage 3 (moderate): Secondary | ICD-10-CM | POA: Diagnosis not present

## 2016-12-09 DIAGNOSIS — G4733 Obstructive sleep apnea (adult) (pediatric): Secondary | ICD-10-CM | POA: Diagnosis not present

## 2016-12-09 DIAGNOSIS — Z940 Kidney transplant status: Secondary | ICD-10-CM | POA: Diagnosis not present

## 2016-12-10 DIAGNOSIS — E1165 Type 2 diabetes mellitus with hyperglycemia: Secondary | ICD-10-CM | POA: Diagnosis not present

## 2016-12-10 DIAGNOSIS — I5032 Chronic diastolic (congestive) heart failure: Secondary | ICD-10-CM | POA: Diagnosis not present

## 2016-12-10 DIAGNOSIS — E1122 Type 2 diabetes mellitus with diabetic chronic kidney disease: Secondary | ICD-10-CM | POA: Diagnosis not present

## 2016-12-10 DIAGNOSIS — N189 Chronic kidney disease, unspecified: Secondary | ICD-10-CM | POA: Diagnosis not present

## 2016-12-10 DIAGNOSIS — G40901 Epilepsy, unspecified, not intractable, with status epilepticus: Secondary | ICD-10-CM | POA: Diagnosis not present

## 2016-12-10 DIAGNOSIS — I130 Hypertensive heart and chronic kidney disease with heart failure and stage 1 through stage 4 chronic kidney disease, or unspecified chronic kidney disease: Secondary | ICD-10-CM | POA: Diagnosis not present

## 2016-12-11 DIAGNOSIS — E119 Type 2 diabetes mellitus without complications: Secondary | ICD-10-CM | POA: Diagnosis not present

## 2016-12-11 DIAGNOSIS — M1A9XX0 Chronic gout, unspecified, without tophus (tophi): Secondary | ICD-10-CM | POA: Diagnosis not present

## 2016-12-11 DIAGNOSIS — I5032 Chronic diastolic (congestive) heart failure: Secondary | ICD-10-CM | POA: Diagnosis not present

## 2016-12-11 DIAGNOSIS — I10 Essential (primary) hypertension: Secondary | ICD-10-CM | POA: Diagnosis not present

## 2016-12-11 DIAGNOSIS — E1165 Type 2 diabetes mellitus with hyperglycemia: Secondary | ICD-10-CM | POA: Diagnosis not present

## 2016-12-11 DIAGNOSIS — I130 Hypertensive heart and chronic kidney disease with heart failure and stage 1 through stage 4 chronic kidney disease, or unspecified chronic kidney disease: Secondary | ICD-10-CM | POA: Diagnosis not present

## 2016-12-11 DIAGNOSIS — Z794 Long term (current) use of insulin: Secondary | ICD-10-CM | POA: Diagnosis not present

## 2016-12-11 DIAGNOSIS — I509 Heart failure, unspecified: Secondary | ICD-10-CM | POA: Diagnosis not present

## 2016-12-11 DIAGNOSIS — G40909 Epilepsy, unspecified, not intractable, without status epilepticus: Secondary | ICD-10-CM | POA: Diagnosis not present

## 2016-12-11 DIAGNOSIS — E559 Vitamin D deficiency, unspecified: Secondary | ICD-10-CM | POA: Diagnosis not present

## 2016-12-11 DIAGNOSIS — E7800 Pure hypercholesterolemia, unspecified: Secondary | ICD-10-CM | POA: Diagnosis not present

## 2016-12-11 DIAGNOSIS — G40901 Epilepsy, unspecified, not intractable, with status epilepticus: Secondary | ICD-10-CM | POA: Diagnosis not present

## 2016-12-11 DIAGNOSIS — H5440 Blindness, one eye, unspecified eye: Secondary | ICD-10-CM | POA: Diagnosis not present

## 2016-12-11 DIAGNOSIS — E1122 Type 2 diabetes mellitus with diabetic chronic kidney disease: Secondary | ICD-10-CM | POA: Diagnosis not present

## 2016-12-11 DIAGNOSIS — F5101 Primary insomnia: Secondary | ICD-10-CM | POA: Diagnosis not present

## 2016-12-11 DIAGNOSIS — I2510 Atherosclerotic heart disease of native coronary artery without angina pectoris: Secondary | ICD-10-CM | POA: Diagnosis not present

## 2016-12-11 DIAGNOSIS — N189 Chronic kidney disease, unspecified: Secondary | ICD-10-CM | POA: Diagnosis not present

## 2016-12-12 DIAGNOSIS — E1165 Type 2 diabetes mellitus with hyperglycemia: Secondary | ICD-10-CM | POA: Diagnosis not present

## 2016-12-12 DIAGNOSIS — E1122 Type 2 diabetes mellitus with diabetic chronic kidney disease: Secondary | ICD-10-CM | POA: Diagnosis not present

## 2016-12-12 DIAGNOSIS — I5032 Chronic diastolic (congestive) heart failure: Secondary | ICD-10-CM | POA: Diagnosis not present

## 2016-12-12 DIAGNOSIS — N189 Chronic kidney disease, unspecified: Secondary | ICD-10-CM | POA: Diagnosis not present

## 2016-12-12 DIAGNOSIS — G40901 Epilepsy, unspecified, not intractable, with status epilepticus: Secondary | ICD-10-CM | POA: Diagnosis not present

## 2016-12-12 DIAGNOSIS — I130 Hypertensive heart and chronic kidney disease with heart failure and stage 1 through stage 4 chronic kidney disease, or unspecified chronic kidney disease: Secondary | ICD-10-CM | POA: Diagnosis not present

## 2016-12-13 DIAGNOSIS — I5032 Chronic diastolic (congestive) heart failure: Secondary | ICD-10-CM | POA: Diagnosis not present

## 2016-12-13 DIAGNOSIS — E1122 Type 2 diabetes mellitus with diabetic chronic kidney disease: Secondary | ICD-10-CM | POA: Diagnosis not present

## 2016-12-13 DIAGNOSIS — E1165 Type 2 diabetes mellitus with hyperglycemia: Secondary | ICD-10-CM | POA: Diagnosis not present

## 2016-12-13 DIAGNOSIS — G40901 Epilepsy, unspecified, not intractable, with status epilepticus: Secondary | ICD-10-CM | POA: Diagnosis not present

## 2016-12-13 DIAGNOSIS — I130 Hypertensive heart and chronic kidney disease with heart failure and stage 1 through stage 4 chronic kidney disease, or unspecified chronic kidney disease: Secondary | ICD-10-CM | POA: Diagnosis not present

## 2016-12-13 DIAGNOSIS — N189 Chronic kidney disease, unspecified: Secondary | ICD-10-CM | POA: Diagnosis not present

## 2016-12-16 DIAGNOSIS — I130 Hypertensive heart and chronic kidney disease with heart failure and stage 1 through stage 4 chronic kidney disease, or unspecified chronic kidney disease: Secondary | ICD-10-CM | POA: Diagnosis not present

## 2016-12-16 DIAGNOSIS — N189 Chronic kidney disease, unspecified: Secondary | ICD-10-CM | POA: Diagnosis not present

## 2016-12-16 DIAGNOSIS — I5032 Chronic diastolic (congestive) heart failure: Secondary | ICD-10-CM | POA: Diagnosis not present

## 2016-12-16 DIAGNOSIS — E1165 Type 2 diabetes mellitus with hyperglycemia: Secondary | ICD-10-CM | POA: Diagnosis not present

## 2016-12-16 DIAGNOSIS — G40901 Epilepsy, unspecified, not intractable, with status epilepticus: Secondary | ICD-10-CM | POA: Diagnosis not present

## 2016-12-16 DIAGNOSIS — E1122 Type 2 diabetes mellitus with diabetic chronic kidney disease: Secondary | ICD-10-CM | POA: Diagnosis not present

## 2016-12-17 DIAGNOSIS — I160 Hypertensive urgency: Secondary | ICD-10-CM | POA: Diagnosis not present

## 2016-12-17 DIAGNOSIS — I10 Essential (primary) hypertension: Secondary | ICD-10-CM | POA: Diagnosis not present

## 2016-12-17 DIAGNOSIS — R2689 Other abnormalities of gait and mobility: Secondary | ICD-10-CM | POA: Diagnosis not present

## 2016-12-17 DIAGNOSIS — Z853 Personal history of malignant neoplasm of breast: Secondary | ICD-10-CM | POA: Diagnosis not present

## 2016-12-17 DIAGNOSIS — R51 Headache: Secondary | ICD-10-CM | POA: Diagnosis not present

## 2016-12-17 DIAGNOSIS — Z9221 Personal history of antineoplastic chemotherapy: Secondary | ICD-10-CM | POA: Diagnosis not present

## 2016-12-17 DIAGNOSIS — G4700 Insomnia, unspecified: Secondary | ICD-10-CM | POA: Diagnosis not present

## 2016-12-17 DIAGNOSIS — E118 Type 2 diabetes mellitus with unspecified complications: Secondary | ICD-10-CM | POA: Diagnosis not present

## 2016-12-17 DIAGNOSIS — H5462 Unqualified visual loss, left eye, normal vision right eye: Secondary | ICD-10-CM | POA: Diagnosis not present

## 2016-12-17 DIAGNOSIS — G40909 Epilepsy, unspecified, not intractable, without status epilepticus: Secondary | ICD-10-CM | POA: Diagnosis not present

## 2016-12-17 DIAGNOSIS — I5032 Chronic diastolic (congestive) heart failure: Secondary | ICD-10-CM | POA: Diagnosis not present

## 2016-12-17 DIAGNOSIS — E1165 Type 2 diabetes mellitus with hyperglycemia: Secondary | ICD-10-CM | POA: Diagnosis not present

## 2016-12-17 DIAGNOSIS — Z9889 Other specified postprocedural states: Secondary | ICD-10-CM | POA: Diagnosis not present

## 2016-12-17 DIAGNOSIS — E039 Hypothyroidism, unspecified: Secondary | ICD-10-CM | POA: Diagnosis not present

## 2016-12-17 DIAGNOSIS — I110 Hypertensive heart disease with heart failure: Secondary | ICD-10-CM | POA: Diagnosis not present

## 2016-12-17 DIAGNOSIS — Z90710 Acquired absence of both cervix and uterus: Secondary | ICD-10-CM | POA: Diagnosis not present

## 2016-12-17 DIAGNOSIS — J019 Acute sinusitis, unspecified: Secondary | ICD-10-CM | POA: Diagnosis not present

## 2016-12-17 DIAGNOSIS — Z9842 Cataract extraction status, left eye: Secondary | ICD-10-CM | POA: Diagnosis not present

## 2016-12-17 DIAGNOSIS — J45909 Unspecified asthma, uncomplicated: Secondary | ICD-10-CM | POA: Diagnosis not present

## 2016-12-17 DIAGNOSIS — E785 Hyperlipidemia, unspecified: Secondary | ICD-10-CM | POA: Diagnosis not present

## 2016-12-17 DIAGNOSIS — I252 Old myocardial infarction: Secondary | ICD-10-CM | POA: Diagnosis not present

## 2016-12-17 DIAGNOSIS — Z940 Kidney transplant status: Secondary | ICD-10-CM | POA: Diagnosis not present

## 2016-12-17 DIAGNOSIS — N186 End stage renal disease: Secondary | ICD-10-CM | POA: Diagnosis not present

## 2016-12-17 DIAGNOSIS — Z794 Long term (current) use of insulin: Secondary | ICD-10-CM | POA: Diagnosis not present

## 2016-12-17 DIAGNOSIS — I2510 Atherosclerotic heart disease of native coronary artery without angina pectoris: Secondary | ICD-10-CM | POA: Diagnosis not present

## 2016-12-17 DIAGNOSIS — B9689 Other specified bacterial agents as the cause of diseases classified elsewhere: Secondary | ICD-10-CM | POA: Diagnosis not present

## 2016-12-17 DIAGNOSIS — Z9049 Acquired absence of other specified parts of digestive tract: Secondary | ICD-10-CM | POA: Diagnosis not present

## 2016-12-17 DIAGNOSIS — R569 Unspecified convulsions: Secondary | ICD-10-CM | POA: Diagnosis not present

## 2016-12-17 DIAGNOSIS — Z7982 Long term (current) use of aspirin: Secondary | ICD-10-CM | POA: Diagnosis not present

## 2016-12-17 DIAGNOSIS — Z923 Personal history of irradiation: Secondary | ICD-10-CM | POA: Diagnosis not present

## 2016-12-17 DIAGNOSIS — R4182 Altered mental status, unspecified: Secondary | ICD-10-CM | POA: Diagnosis not present

## 2016-12-18 DIAGNOSIS — R2689 Other abnormalities of gait and mobility: Secondary | ICD-10-CM | POA: Diagnosis not present

## 2016-12-18 DIAGNOSIS — R569 Unspecified convulsions: Secondary | ICD-10-CM | POA: Diagnosis not present

## 2016-12-18 DIAGNOSIS — N186 End stage renal disease: Secondary | ICD-10-CM | POA: Diagnosis not present

## 2016-12-18 DIAGNOSIS — I10 Essential (primary) hypertension: Secondary | ICD-10-CM | POA: Diagnosis not present

## 2016-12-18 DIAGNOSIS — R51 Headache: Secondary | ICD-10-CM | POA: Diagnosis not present

## 2016-12-18 DIAGNOSIS — Z940 Kidney transplant status: Secondary | ICD-10-CM | POA: Diagnosis not present

## 2016-12-18 DIAGNOSIS — E118 Type 2 diabetes mellitus with unspecified complications: Secondary | ICD-10-CM | POA: Diagnosis not present

## 2016-12-18 DIAGNOSIS — R4182 Altered mental status, unspecified: Secondary | ICD-10-CM | POA: Diagnosis not present

## 2016-12-19 DIAGNOSIS — G40909 Epilepsy, unspecified, not intractable, without status epilepticus: Secondary | ICD-10-CM | POA: Diagnosis present

## 2016-12-19 DIAGNOSIS — Z90710 Acquired absence of both cervix and uterus: Secondary | ICD-10-CM | POA: Diagnosis not present

## 2016-12-19 DIAGNOSIS — E1165 Type 2 diabetes mellitus with hyperglycemia: Secondary | ICD-10-CM | POA: Diagnosis present

## 2016-12-19 DIAGNOSIS — Z940 Kidney transplant status: Secondary | ICD-10-CM | POA: Diagnosis not present

## 2016-12-19 DIAGNOSIS — B9689 Other specified bacterial agents as the cause of diseases classified elsewhere: Secondary | ICD-10-CM | POA: Diagnosis present

## 2016-12-19 DIAGNOSIS — E785 Hyperlipidemia, unspecified: Secondary | ICD-10-CM | POA: Diagnosis present

## 2016-12-19 DIAGNOSIS — I110 Hypertensive heart disease with heart failure: Secondary | ICD-10-CM | POA: Diagnosis present

## 2016-12-19 DIAGNOSIS — R2689 Other abnormalities of gait and mobility: Secondary | ICD-10-CM | POA: Diagnosis not present

## 2016-12-19 DIAGNOSIS — E039 Hypothyroidism, unspecified: Secondary | ICD-10-CM | POA: Diagnosis present

## 2016-12-19 DIAGNOSIS — E118 Type 2 diabetes mellitus with unspecified complications: Secondary | ICD-10-CM | POA: Diagnosis not present

## 2016-12-19 DIAGNOSIS — Z923 Personal history of irradiation: Secondary | ICD-10-CM | POA: Diagnosis not present

## 2016-12-19 DIAGNOSIS — I2510 Atherosclerotic heart disease of native coronary artery without angina pectoris: Secondary | ICD-10-CM | POA: Diagnosis present

## 2016-12-19 DIAGNOSIS — Z7982 Long term (current) use of aspirin: Secondary | ICD-10-CM | POA: Diagnosis not present

## 2016-12-19 DIAGNOSIS — Z9889 Other specified postprocedural states: Secondary | ICD-10-CM | POA: Diagnosis not present

## 2016-12-19 DIAGNOSIS — Z9221 Personal history of antineoplastic chemotherapy: Secondary | ICD-10-CM | POA: Diagnosis not present

## 2016-12-19 DIAGNOSIS — I10 Essential (primary) hypertension: Secondary | ICD-10-CM | POA: Diagnosis not present

## 2016-12-19 DIAGNOSIS — I5032 Chronic diastolic (congestive) heart failure: Secondary | ICD-10-CM | POA: Diagnosis present

## 2016-12-19 DIAGNOSIS — R4182 Altered mental status, unspecified: Secondary | ICD-10-CM | POA: Diagnosis not present

## 2016-12-19 DIAGNOSIS — N186 End stage renal disease: Secondary | ICD-10-CM | POA: Diagnosis not present

## 2016-12-19 DIAGNOSIS — I160 Hypertensive urgency: Secondary | ICD-10-CM | POA: Diagnosis present

## 2016-12-19 DIAGNOSIS — Z9049 Acquired absence of other specified parts of digestive tract: Secondary | ICD-10-CM | POA: Diagnosis not present

## 2016-12-19 DIAGNOSIS — J019 Acute sinusitis, unspecified: Secondary | ICD-10-CM | POA: Diagnosis present

## 2016-12-19 DIAGNOSIS — Z794 Long term (current) use of insulin: Secondary | ICD-10-CM | POA: Diagnosis not present

## 2016-12-19 DIAGNOSIS — R51 Headache: Secondary | ICD-10-CM | POA: Diagnosis not present

## 2016-12-19 DIAGNOSIS — H5462 Unqualified visual loss, left eye, normal vision right eye: Secondary | ICD-10-CM | POA: Diagnosis present

## 2016-12-19 DIAGNOSIS — G4700 Insomnia, unspecified: Secondary | ICD-10-CM | POA: Diagnosis present

## 2016-12-19 DIAGNOSIS — R569 Unspecified convulsions: Secondary | ICD-10-CM | POA: Diagnosis not present

## 2016-12-19 DIAGNOSIS — I252 Old myocardial infarction: Secondary | ICD-10-CM | POA: Diagnosis not present

## 2016-12-19 DIAGNOSIS — Z853 Personal history of malignant neoplasm of breast: Secondary | ICD-10-CM | POA: Diagnosis not present

## 2016-12-19 DIAGNOSIS — Z9842 Cataract extraction status, left eye: Secondary | ICD-10-CM | POA: Diagnosis not present

## 2016-12-19 DIAGNOSIS — J45909 Unspecified asthma, uncomplicated: Secondary | ICD-10-CM | POA: Diagnosis present

## 2016-12-20 DIAGNOSIS — R569 Unspecified convulsions: Secondary | ICD-10-CM | POA: Diagnosis not present

## 2016-12-20 DIAGNOSIS — R2689 Other abnormalities of gait and mobility: Secondary | ICD-10-CM | POA: Diagnosis not present

## 2016-12-20 DIAGNOSIS — Z940 Kidney transplant status: Secondary | ICD-10-CM | POA: Diagnosis not present

## 2016-12-20 DIAGNOSIS — N186 End stage renal disease: Secondary | ICD-10-CM | POA: Diagnosis not present

## 2016-12-20 DIAGNOSIS — R4182 Altered mental status, unspecified: Secondary | ICD-10-CM | POA: Diagnosis not present

## 2016-12-20 DIAGNOSIS — E118 Type 2 diabetes mellitus with unspecified complications: Secondary | ICD-10-CM | POA: Diagnosis not present

## 2016-12-20 DIAGNOSIS — R51 Headache: Secondary | ICD-10-CM | POA: Diagnosis not present

## 2016-12-20 DIAGNOSIS — I10 Essential (primary) hypertension: Secondary | ICD-10-CM | POA: Diagnosis not present

## 2016-12-25 DIAGNOSIS — G4089 Other seizures: Secondary | ICD-10-CM | POA: Diagnosis not present

## 2016-12-25 DIAGNOSIS — I120 Hypertensive chronic kidney disease with stage 5 chronic kidney disease or end stage renal disease: Secondary | ICD-10-CM | POA: Diagnosis not present

## 2016-12-25 DIAGNOSIS — E1122 Type 2 diabetes mellitus with diabetic chronic kidney disease: Secondary | ICD-10-CM | POA: Diagnosis not present

## 2016-12-25 DIAGNOSIS — C50911 Malignant neoplasm of unspecified site of right female breast: Secondary | ICD-10-CM | POA: Diagnosis not present

## 2016-12-25 DIAGNOSIS — Z7982 Long term (current) use of aspirin: Secondary | ICD-10-CM | POA: Diagnosis not present

## 2016-12-25 DIAGNOSIS — N186 End stage renal disease: Secondary | ICD-10-CM | POA: Diagnosis not present

## 2016-12-25 DIAGNOSIS — Z79899 Other long term (current) drug therapy: Secondary | ICD-10-CM | POA: Diagnosis not present

## 2016-12-25 DIAGNOSIS — J45998 Other asthma: Secondary | ICD-10-CM | POA: Diagnosis not present

## 2016-12-25 DIAGNOSIS — E038 Other specified hypothyroidism: Secondary | ICD-10-CM | POA: Diagnosis not present

## 2016-12-25 DIAGNOSIS — Z794 Long term (current) use of insulin: Secondary | ICD-10-CM | POA: Diagnosis not present

## 2016-12-25 DIAGNOSIS — I10 Essential (primary) hypertension: Secondary | ICD-10-CM | POA: Diagnosis not present

## 2016-12-26 DIAGNOSIS — G40901 Epilepsy, unspecified, not intractable, with status epilepticus: Secondary | ICD-10-CM | POA: Diagnosis not present

## 2016-12-26 DIAGNOSIS — E1122 Type 2 diabetes mellitus with diabetic chronic kidney disease: Secondary | ICD-10-CM | POA: Diagnosis not present

## 2016-12-26 DIAGNOSIS — I130 Hypertensive heart and chronic kidney disease with heart failure and stage 1 through stage 4 chronic kidney disease, or unspecified chronic kidney disease: Secondary | ICD-10-CM | POA: Diagnosis not present

## 2016-12-26 DIAGNOSIS — I5032 Chronic diastolic (congestive) heart failure: Secondary | ICD-10-CM | POA: Diagnosis not present

## 2016-12-26 DIAGNOSIS — N189 Chronic kidney disease, unspecified: Secondary | ICD-10-CM | POA: Diagnosis not present

## 2016-12-26 DIAGNOSIS — E1165 Type 2 diabetes mellitus with hyperglycemia: Secondary | ICD-10-CM | POA: Diagnosis not present

## 2016-12-27 DIAGNOSIS — I130 Hypertensive heart and chronic kidney disease with heart failure and stage 1 through stage 4 chronic kidney disease, or unspecified chronic kidney disease: Secondary | ICD-10-CM | POA: Diagnosis not present

## 2016-12-27 DIAGNOSIS — I5032 Chronic diastolic (congestive) heart failure: Secondary | ICD-10-CM | POA: Diagnosis not present

## 2016-12-27 DIAGNOSIS — N189 Chronic kidney disease, unspecified: Secondary | ICD-10-CM | POA: Diagnosis not present

## 2016-12-27 DIAGNOSIS — E1165 Type 2 diabetes mellitus with hyperglycemia: Secondary | ICD-10-CM | POA: Diagnosis not present

## 2016-12-27 DIAGNOSIS — G40901 Epilepsy, unspecified, not intractable, with status epilepticus: Secondary | ICD-10-CM | POA: Diagnosis not present

## 2016-12-27 DIAGNOSIS — E1122 Type 2 diabetes mellitus with diabetic chronic kidney disease: Secondary | ICD-10-CM | POA: Diagnosis not present

## 2016-12-28 DIAGNOSIS — E1165 Type 2 diabetes mellitus with hyperglycemia: Secondary | ICD-10-CM | POA: Diagnosis not present

## 2016-12-28 DIAGNOSIS — N189 Chronic kidney disease, unspecified: Secondary | ICD-10-CM | POA: Diagnosis not present

## 2016-12-28 DIAGNOSIS — G40901 Epilepsy, unspecified, not intractable, with status epilepticus: Secondary | ICD-10-CM | POA: Diagnosis not present

## 2016-12-28 DIAGNOSIS — I130 Hypertensive heart and chronic kidney disease with heart failure and stage 1 through stage 4 chronic kidney disease, or unspecified chronic kidney disease: Secondary | ICD-10-CM | POA: Diagnosis not present

## 2016-12-28 DIAGNOSIS — I5032 Chronic diastolic (congestive) heart failure: Secondary | ICD-10-CM | POA: Diagnosis not present

## 2016-12-28 DIAGNOSIS — E1122 Type 2 diabetes mellitus with diabetic chronic kidney disease: Secondary | ICD-10-CM | POA: Diagnosis not present

## 2016-12-30 DIAGNOSIS — G40901 Epilepsy, unspecified, not intractable, with status epilepticus: Secondary | ICD-10-CM | POA: Diagnosis not present

## 2016-12-30 DIAGNOSIS — I10 Essential (primary) hypertension: Secondary | ICD-10-CM | POA: Diagnosis not present

## 2016-12-30 DIAGNOSIS — R413 Other amnesia: Secondary | ICD-10-CM | POA: Diagnosis not present

## 2016-12-30 DIAGNOSIS — C50929 Malignant neoplasm of unspecified site of unspecified male breast: Secondary | ICD-10-CM | POA: Diagnosis not present

## 2016-12-30 DIAGNOSIS — E1165 Type 2 diabetes mellitus with hyperglycemia: Secondary | ICD-10-CM | POA: Diagnosis not present

## 2016-12-30 DIAGNOSIS — I5032 Chronic diastolic (congestive) heart failure: Secondary | ICD-10-CM | POA: Diagnosis not present

## 2016-12-30 DIAGNOSIS — N189 Chronic kidney disease, unspecified: Secondary | ICD-10-CM | POA: Diagnosis not present

## 2016-12-30 DIAGNOSIS — E1122 Type 2 diabetes mellitus with diabetic chronic kidney disease: Secondary | ICD-10-CM | POA: Diagnosis not present

## 2016-12-30 DIAGNOSIS — R51 Headache: Secondary | ICD-10-CM | POA: Diagnosis not present

## 2016-12-30 DIAGNOSIS — I130 Hypertensive heart and chronic kidney disease with heart failure and stage 1 through stage 4 chronic kidney disease, or unspecified chronic kidney disease: Secondary | ICD-10-CM | POA: Diagnosis not present

## 2016-12-30 DIAGNOSIS — G4710 Hypersomnia, unspecified: Secondary | ICD-10-CM | POA: Diagnosis not present

## 2016-12-31 DIAGNOSIS — N189 Chronic kidney disease, unspecified: Secondary | ICD-10-CM | POA: Diagnosis not present

## 2016-12-31 DIAGNOSIS — E1165 Type 2 diabetes mellitus with hyperglycemia: Secondary | ICD-10-CM | POA: Diagnosis not present

## 2016-12-31 DIAGNOSIS — G40901 Epilepsy, unspecified, not intractable, with status epilepticus: Secondary | ICD-10-CM | POA: Diagnosis not present

## 2016-12-31 DIAGNOSIS — I5032 Chronic diastolic (congestive) heart failure: Secondary | ICD-10-CM | POA: Diagnosis not present

## 2016-12-31 DIAGNOSIS — E1122 Type 2 diabetes mellitus with diabetic chronic kidney disease: Secondary | ICD-10-CM | POA: Diagnosis not present

## 2016-12-31 DIAGNOSIS — I130 Hypertensive heart and chronic kidney disease with heart failure and stage 1 through stage 4 chronic kidney disease, or unspecified chronic kidney disease: Secondary | ICD-10-CM | POA: Diagnosis not present

## 2017-01-01 DIAGNOSIS — E1165 Type 2 diabetes mellitus with hyperglycemia: Secondary | ICD-10-CM | POA: Diagnosis not present

## 2017-01-01 DIAGNOSIS — G40901 Epilepsy, unspecified, not intractable, with status epilepticus: Secondary | ICD-10-CM | POA: Diagnosis not present

## 2017-01-01 DIAGNOSIS — E1122 Type 2 diabetes mellitus with diabetic chronic kidney disease: Secondary | ICD-10-CM | POA: Diagnosis not present

## 2017-01-01 DIAGNOSIS — N189 Chronic kidney disease, unspecified: Secondary | ICD-10-CM | POA: Diagnosis not present

## 2017-01-01 DIAGNOSIS — I130 Hypertensive heart and chronic kidney disease with heart failure and stage 1 through stage 4 chronic kidney disease, or unspecified chronic kidney disease: Secondary | ICD-10-CM | POA: Diagnosis not present

## 2017-01-01 DIAGNOSIS — I5032 Chronic diastolic (congestive) heart failure: Secondary | ICD-10-CM | POA: Diagnosis not present

## 2017-01-02 DIAGNOSIS — E1165 Type 2 diabetes mellitus with hyperglycemia: Secondary | ICD-10-CM | POA: Diagnosis not present

## 2017-01-02 DIAGNOSIS — I5032 Chronic diastolic (congestive) heart failure: Secondary | ICD-10-CM | POA: Diagnosis not present

## 2017-01-02 DIAGNOSIS — G40901 Epilepsy, unspecified, not intractable, with status epilepticus: Secondary | ICD-10-CM | POA: Diagnosis not present

## 2017-01-02 DIAGNOSIS — R0902 Hypoxemia: Secondary | ICD-10-CM | POA: Diagnosis not present

## 2017-01-02 DIAGNOSIS — N189 Chronic kidney disease, unspecified: Secondary | ICD-10-CM | POA: Diagnosis not present

## 2017-01-02 DIAGNOSIS — I130 Hypertensive heart and chronic kidney disease with heart failure and stage 1 through stage 4 chronic kidney disease, or unspecified chronic kidney disease: Secondary | ICD-10-CM | POA: Diagnosis not present

## 2017-01-02 DIAGNOSIS — E1122 Type 2 diabetes mellitus with diabetic chronic kidney disease: Secondary | ICD-10-CM | POA: Diagnosis not present

## 2017-01-03 DIAGNOSIS — E1165 Type 2 diabetes mellitus with hyperglycemia: Secondary | ICD-10-CM | POA: Diagnosis not present

## 2017-01-03 DIAGNOSIS — E1122 Type 2 diabetes mellitus with diabetic chronic kidney disease: Secondary | ICD-10-CM | POA: Diagnosis not present

## 2017-01-03 DIAGNOSIS — I130 Hypertensive heart and chronic kidney disease with heart failure and stage 1 through stage 4 chronic kidney disease, or unspecified chronic kidney disease: Secondary | ICD-10-CM | POA: Diagnosis not present

## 2017-01-03 DIAGNOSIS — N189 Chronic kidney disease, unspecified: Secondary | ICD-10-CM | POA: Diagnosis not present

## 2017-01-03 DIAGNOSIS — I5032 Chronic diastolic (congestive) heart failure: Secondary | ICD-10-CM | POA: Diagnosis not present

## 2017-01-03 DIAGNOSIS — G40901 Epilepsy, unspecified, not intractable, with status epilepticus: Secondary | ICD-10-CM | POA: Diagnosis not present

## 2017-01-05 DIAGNOSIS — I130 Hypertensive heart and chronic kidney disease with heart failure and stage 1 through stage 4 chronic kidney disease, or unspecified chronic kidney disease: Secondary | ICD-10-CM | POA: Diagnosis not present

## 2017-01-05 DIAGNOSIS — N189 Chronic kidney disease, unspecified: Secondary | ICD-10-CM | POA: Diagnosis not present

## 2017-01-05 DIAGNOSIS — E1165 Type 2 diabetes mellitus with hyperglycemia: Secondary | ICD-10-CM | POA: Diagnosis not present

## 2017-01-05 DIAGNOSIS — I5032 Chronic diastolic (congestive) heart failure: Secondary | ICD-10-CM | POA: Diagnosis not present

## 2017-01-05 DIAGNOSIS — G40901 Epilepsy, unspecified, not intractable, with status epilepticus: Secondary | ICD-10-CM | POA: Diagnosis not present

## 2017-01-05 DIAGNOSIS — E1122 Type 2 diabetes mellitus with diabetic chronic kidney disease: Secondary | ICD-10-CM | POA: Diagnosis not present

## 2017-01-06 ENCOUNTER — Other Ambulatory Visit: Payer: Self-pay | Admitting: Adult Health

## 2017-01-06 DIAGNOSIS — I5032 Chronic diastolic (congestive) heart failure: Secondary | ICD-10-CM | POA: Diagnosis not present

## 2017-01-06 DIAGNOSIS — C50511 Malignant neoplasm of lower-outer quadrant of right female breast: Secondary | ICD-10-CM

## 2017-01-06 DIAGNOSIS — E1122 Type 2 diabetes mellitus with diabetic chronic kidney disease: Secondary | ICD-10-CM | POA: Diagnosis not present

## 2017-01-06 DIAGNOSIS — I130 Hypertensive heart and chronic kidney disease with heart failure and stage 1 through stage 4 chronic kidney disease, or unspecified chronic kidney disease: Secondary | ICD-10-CM | POA: Diagnosis not present

## 2017-01-06 DIAGNOSIS — E1165 Type 2 diabetes mellitus with hyperglycemia: Secondary | ICD-10-CM | POA: Diagnosis not present

## 2017-01-06 DIAGNOSIS — N189 Chronic kidney disease, unspecified: Secondary | ICD-10-CM | POA: Diagnosis not present

## 2017-01-06 DIAGNOSIS — Z171 Estrogen receptor negative status [ER-]: Principal | ICD-10-CM

## 2017-01-06 DIAGNOSIS — G40901 Epilepsy, unspecified, not intractable, with status epilepticus: Secondary | ICD-10-CM | POA: Diagnosis not present

## 2017-01-08 DIAGNOSIS — H6502 Acute serous otitis media, left ear: Secondary | ICD-10-CM | POA: Diagnosis not present

## 2017-01-08 DIAGNOSIS — R51 Headache: Secondary | ICD-10-CM | POA: Diagnosis not present

## 2017-01-08 DIAGNOSIS — R269 Unspecified abnormalities of gait and mobility: Secondary | ICD-10-CM | POA: Diagnosis not present

## 2017-01-08 DIAGNOSIS — R413 Other amnesia: Secondary | ICD-10-CM | POA: Diagnosis not present

## 2017-01-08 DIAGNOSIS — R0981 Nasal congestion: Secondary | ICD-10-CM | POA: Diagnosis not present

## 2017-01-08 DIAGNOSIS — H9202 Otalgia, left ear: Secondary | ICD-10-CM | POA: Diagnosis not present

## 2017-01-08 DIAGNOSIS — Z940 Kidney transplant status: Secondary | ICD-10-CM | POA: Diagnosis not present

## 2017-01-08 DIAGNOSIS — H6122 Impacted cerumen, left ear: Secondary | ICD-10-CM | POA: Diagnosis not present

## 2017-01-09 DIAGNOSIS — I5032 Chronic diastolic (congestive) heart failure: Secondary | ICD-10-CM | POA: Diagnosis not present

## 2017-01-09 DIAGNOSIS — E1165 Type 2 diabetes mellitus with hyperglycemia: Secondary | ICD-10-CM | POA: Diagnosis not present

## 2017-01-09 DIAGNOSIS — G40901 Epilepsy, unspecified, not intractable, with status epilepticus: Secondary | ICD-10-CM | POA: Diagnosis not present

## 2017-01-09 DIAGNOSIS — N189 Chronic kidney disease, unspecified: Secondary | ICD-10-CM | POA: Diagnosis not present

## 2017-01-09 DIAGNOSIS — E1122 Type 2 diabetes mellitus with diabetic chronic kidney disease: Secondary | ICD-10-CM | POA: Diagnosis not present

## 2017-01-09 DIAGNOSIS — I130 Hypertensive heart and chronic kidney disease with heart failure and stage 1 through stage 4 chronic kidney disease, or unspecified chronic kidney disease: Secondary | ICD-10-CM | POA: Diagnosis not present

## 2017-01-10 DIAGNOSIS — I5032 Chronic diastolic (congestive) heart failure: Secondary | ICD-10-CM | POA: Diagnosis not present

## 2017-01-10 DIAGNOSIS — I130 Hypertensive heart and chronic kidney disease with heart failure and stage 1 through stage 4 chronic kidney disease, or unspecified chronic kidney disease: Secondary | ICD-10-CM | POA: Diagnosis not present

## 2017-01-10 DIAGNOSIS — I2510 Atherosclerotic heart disease of native coronary artery without angina pectoris: Secondary | ICD-10-CM | POA: Diagnosis not present

## 2017-01-10 DIAGNOSIS — G40901 Epilepsy, unspecified, not intractable, with status epilepticus: Secondary | ICD-10-CM | POA: Diagnosis not present

## 2017-01-10 DIAGNOSIS — E1122 Type 2 diabetes mellitus with diabetic chronic kidney disease: Secondary | ICD-10-CM | POA: Diagnosis not present

## 2017-01-10 DIAGNOSIS — N189 Chronic kidney disease, unspecified: Secondary | ICD-10-CM | POA: Diagnosis not present

## 2017-01-13 DIAGNOSIS — G40901 Epilepsy, unspecified, not intractable, with status epilepticus: Secondary | ICD-10-CM | POA: Diagnosis not present

## 2017-01-13 DIAGNOSIS — I130 Hypertensive heart and chronic kidney disease with heart failure and stage 1 through stage 4 chronic kidney disease, or unspecified chronic kidney disease: Secondary | ICD-10-CM | POA: Diagnosis not present

## 2017-01-13 DIAGNOSIS — N189 Chronic kidney disease, unspecified: Secondary | ICD-10-CM | POA: Diagnosis not present

## 2017-01-13 DIAGNOSIS — I2510 Atherosclerotic heart disease of native coronary artery without angina pectoris: Secondary | ICD-10-CM | POA: Diagnosis not present

## 2017-01-13 DIAGNOSIS — I5032 Chronic diastolic (congestive) heart failure: Secondary | ICD-10-CM | POA: Diagnosis not present

## 2017-01-13 DIAGNOSIS — E1122 Type 2 diabetes mellitus with diabetic chronic kidney disease: Secondary | ICD-10-CM | POA: Diagnosis not present

## 2017-01-17 ENCOUNTER — Ambulatory Visit
Admission: RE | Admit: 2017-01-17 | Discharge: 2017-01-17 | Disposition: A | Discharge status: Home / Self Care | Payer: Medicare Other | Source: Ambulatory Visit | Attending: Adult Health | Admitting: Adult Health

## 2017-01-17 DIAGNOSIS — C50511 Malignant neoplasm of lower-outer quadrant of right female breast: Secondary | ICD-10-CM

## 2017-01-17 DIAGNOSIS — Z171 Estrogen receptor negative status [ER-]: Principal | ICD-10-CM

## 2017-01-17 HISTORY — DX: Personal history of antineoplastic chemotherapy: Z92.21

## 2017-01-17 HISTORY — DX: Personal history of irradiation: Z92.3

## 2017-01-20 ENCOUNTER — Emergency Department (HOSPITAL_COMMUNITY)
Admission: EM | Admit: 2017-01-20 | Discharge: 2017-01-21 | Disposition: A | Discharge status: Home / Self Care | Payer: Medicare Other | Attending: Emergency Medicine | Admitting: Emergency Medicine

## 2017-01-20 ENCOUNTER — Emergency Department (HOSPITAL_COMMUNITY): Payer: Medicare Other

## 2017-01-20 ENCOUNTER — Other Ambulatory Visit: Payer: Self-pay

## 2017-01-20 ENCOUNTER — Encounter (HOSPITAL_COMMUNITY): Payer: Self-pay

## 2017-01-20 DIAGNOSIS — E039 Hypothyroidism, unspecified: Secondary | ICD-10-CM | POA: Insufficient documentation

## 2017-01-20 DIAGNOSIS — I130 Hypertensive heart and chronic kidney disease with heart failure and stage 1 through stage 4 chronic kidney disease, or unspecified chronic kidney disease: Secondary | ICD-10-CM | POA: Diagnosis not present

## 2017-01-20 DIAGNOSIS — N189 Chronic kidney disease, unspecified: Secondary | ICD-10-CM | POA: Diagnosis not present

## 2017-01-20 DIAGNOSIS — Z853 Personal history of malignant neoplasm of breast: Secondary | ICD-10-CM | POA: Diagnosis not present

## 2017-01-20 DIAGNOSIS — Z79899 Other long term (current) drug therapy: Secondary | ICD-10-CM | POA: Insufficient documentation

## 2017-01-20 DIAGNOSIS — E119 Type 2 diabetes mellitus without complications: Secondary | ICD-10-CM | POA: Diagnosis not present

## 2017-01-20 DIAGNOSIS — Z794 Long term (current) use of insulin: Secondary | ICD-10-CM | POA: Insufficient documentation

## 2017-01-20 DIAGNOSIS — J45909 Unspecified asthma, uncomplicated: Secondary | ICD-10-CM | POA: Insufficient documentation

## 2017-01-20 DIAGNOSIS — H05022 Osteomyelitis of left orbit: Secondary | ICD-10-CM

## 2017-01-20 DIAGNOSIS — I5032 Chronic diastolic (congestive) heart failure: Secondary | ICD-10-CM | POA: Diagnosis not present

## 2017-01-20 DIAGNOSIS — H0520 Unspecified exophthalmos: Secondary | ICD-10-CM | POA: Diagnosis not present

## 2017-01-20 DIAGNOSIS — R51 Headache: Secondary | ICD-10-CM | POA: Diagnosis present

## 2017-01-20 LAB — CBC WITH DIFFERENTIAL/PLATELET
Basophils Absolute: 0 10*3/uL (ref 0.0–0.1)
Basophils Relative: 0 %
Eosinophils Absolute: 0.1 10*3/uL (ref 0.0–0.7)
Eosinophils Relative: 1 %
HEMATOCRIT: 42.7 % (ref 36.0–46.0)
HEMOGLOBIN: 12.9 g/dL (ref 12.0–15.0)
LYMPHS ABS: 1.4 10*3/uL (ref 0.7–4.0)
Lymphocytes Relative: 19 %
MCH: 28.5 pg (ref 26.0–34.0)
MCHC: 30.2 g/dL (ref 30.0–36.0)
MCV: 94.3 fL (ref 78.0–100.0)
MONOS PCT: 8 %
Monocytes Absolute: 0.6 10*3/uL (ref 0.1–1.0)
NEUTROS ABS: 5.4 10*3/uL (ref 1.7–7.7)
NEUTROS PCT: 72 %
Platelets: 316 10*3/uL (ref 150–400)
RBC: 4.53 MIL/uL (ref 3.87–5.11)
RDW: 15.7 % — ABNORMAL HIGH (ref 11.5–15.5)
WBC: 7.4 10*3/uL (ref 4.0–10.5)

## 2017-01-20 LAB — I-STAT CHEM 8, ED
BUN: 14 mg/dL (ref 6–20)
CHLORIDE: 100 mmol/L — AB (ref 101–111)
CREATININE: 0.7 mg/dL (ref 0.44–1.00)
Calcium, Ion: 1.23 mmol/L (ref 1.15–1.40)
Glucose, Bld: 257 mg/dL — ABNORMAL HIGH (ref 65–99)
HEMATOCRIT: 43 % (ref 36.0–46.0)
Hemoglobin: 14.6 g/dL (ref 12.0–15.0)
POTASSIUM: 4.6 mmol/L (ref 3.5–5.1)
Sodium: 137 mmol/L (ref 135–145)
TCO2: 28 mmol/L (ref 22–32)

## 2017-01-20 LAB — CBG MONITORING, ED
Glucose-Capillary: 185 mg/dL — ABNORMAL HIGH (ref 65–99)
Glucose-Capillary: 203 mg/dL — ABNORMAL HIGH (ref 65–99)

## 2017-01-20 MED ORDER — LORAZEPAM 2 MG/ML IJ SOLN
1.0000 mg | INTRAMUSCULAR | Status: DC | PRN
  Administered 2017-01-20: 1 mg via INTRAVENOUS
  Filled 2017-01-20: qty 1

## 2017-01-20 MED ORDER — CLINDAMYCIN PHOSPHATE 600 MG/50ML IV SOLN
600.0000 mg | Freq: Once | INTRAVENOUS | Status: AC
  Administered 2017-01-20: 600 mg via INTRAVENOUS
  Filled 2017-01-20: qty 50

## 2017-01-20 MED ORDER — FENTANYL CITRATE (PF) 100 MCG/2ML IJ SOLN
25.0000 ug | Freq: Once | INTRAMUSCULAR | Status: AC
  Administered 2017-01-20: 25 ug via INTRAVENOUS
  Filled 2017-01-20: qty 2

## 2017-01-20 MED ORDER — GADOBENATE DIMEGLUMINE 529 MG/ML IV SOLN
15.0000 mL | Freq: Once | INTRAVENOUS | Status: AC | PRN
  Administered 2017-01-20: 15 mL via INTRAVENOUS

## 2017-01-20 NOTE — ED Notes (Signed)
Patient transported to MRI 

## 2017-01-20 NOTE — ED Notes (Signed)
Pt accepted to [facility] ED Dr, Angela Cox, Report to be called [phone]

## 2017-01-20 NOTE — ED Notes (Signed)
Pt's CBG result was 185. Informed [place] - Therapist, sports.

## 2017-01-20 NOTE — ED Notes (Signed)
Dr. Alvino Chapel assessing pt in room 2.

## 2017-01-20 NOTE — ED Provider Notes (Signed)
MSE was initiated and I personally evaluated the patient and placed orders (if any) at  12:39 PM on January 20, 2017.  The patient appears stable so that the remainder of the MSE may be completed by another provider.  Sent in by Dr. Zadie Rhine, with whom I discussed the case.  Has proptosis numbness of the left face and decreased eye movements.  Worried of a cavernous sinus syndrome apex syndrome or even potentially orbital pseudotumor.  Discussed with Dr. Nevada Crane from radiology.  Will get orbital MRI with and without contrast.   Davonna Belling, MD 01/20/17 1240

## 2017-01-20 NOTE — ED Triage Notes (Addendum)
Pt arrives POV from eye dr for ct scan of sinus (concern for apex syndrome or cavernous sinus syndrome). Pt has been experiencing left sided head aches and weakness. Today she went to eye dr because she has been unable to open left eye x 1 week. Left eye appears swollen in triage. Pt endorses numbness to eye. Denies pain.

## 2017-01-20 NOTE — ED Provider Notes (Signed)
[facility] EMERGENCY DEPARTMENT Provider Note   CSN: [id number] Arrival date & time: 01/20/17  1157     History   Chief Complaint No chief complaint on file.   HPI Monica Lee is a 67 y.o. female.  Monica Lee is a 67 y.o. Female who presents to the ED after being sent by her ophthalmologist, Dr. Zadie Rhine, for possible APEX syndrome. Monica Lee reports she has had a left sided headache for several weeks and has had two weeks of left eye pain. She reports for the past week she has had numbness around her left eye and left forehead. She reports she is chronically unable to see out of her left eye. She went to her eye doctor today due to the headaches and eye symptoms and was directed to the ED. Monica Lee denies other complaints. She denies fevers, neck pain, chest pain, SOB, abdominal pain, nausea, vomiting, diarrhea, or rashes.    The history is provided by the Monica Lee, medical records and a relative. No language interpreter was used.    Past Medical History:  Diagnosis Date  . Anemia in chronic kidney disease   . Asthma   . At risk for sleep apnea    STOP-BANG= 4   SENT TO PCP  03-25-2013  . Blind left eye    OCCLUDED CORNEA,  FUNDI--  FAILED SURGERY REPAIR 2003  . 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 Chronic diastolic CHF (congestive heart failure) ([place])    a. Cardiac MRI (10/2012): Moderate to severe LVH, EF 55%, chordal SAM but no LVOT gradient, mod circumferential effusion, mild LAE.  b.  Echocardiogram (11/06/12): Severe LVH, EF 56-21%, grade 1 diastolic dysfunction, moderate effusion.  . Chronic kidney disease (CKD), stage III (moderate) ([place])    NEPHROLOGIST--  DR Freddrick March  . Coronary artery disease CARDIOLOGIST--  DR Johnsie Cancel   NON-OBSTRUCTIVE CAD  PER CARDIAC CATH  2011  . Diabetic retinopathy    RIGHT EYE  . DJD (degenerative joint disease)   .  Gastroparesis due to DM ([place])   . GERD (gastroesophageal reflux disease)   . History of CVA (cerebrovascular accident) without residual deficits    2008  AND TIA IN 2008  W/ NO RESIDUAL  . History of kidney stones   . History of myocardial infarction    2011--  S/P CARDIAC CATH ([place])  NON-OBSTRUCTIVE CAD  . History of peptic ulcer    2011--  RESOLVED  . Hx of colonic polyps   . Hyperlipidemia   . Hyperparathyroidism, secondary renal ([place])   . Hypertension   . Hypothyroidism   . Open thigh wound    ANTERIOR  . Pericardial effusion without cardiac tamponade    PER DR NISHAN  NOTE --  NOT MALIGNANT  ? RELATED TO HYPOTHYROIDISM  . Personal history of chemotherapy   . Personal history of radiation therapy   . S/P kidney transplant    CADAVERIC --  05/2006  . Thyromegaly    MULTINODULER GOITER  . Trigeminal neuralgia   . 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 Wears glasses     Monica Lee Active Problem List   Diagnosis Date Noted  . Diabetes mellitus following renal transplant ([place]) 03/09/2015  . Gait disorder 01/11/2015  . Diarrhea 04/25/2014  . CHF exacerbation ([place]) 04/24/2014  . Abscess of  arm, left 04/24/2014  . Allergic rhinitis 03/30/2014  . Arthritis 03/30/2014  . History of skin graft 06/14/2013  . OSA (obstructive sleep apnea) 04/08/2013  . Traumatic hematoma of thigh 02/14/2013  . Cellulitis and abscess of leg 02/14/2013  . Essential hypertension, benign 11/19/2012  . S/P kidney transplant 11/19/2012  . CKD (chronic kidney disease) 11/19/2012  . Pericardial effusion 11/19/2012  . Hypothyroidism 11/13/2012  . Acute on chronic diastolic heart failure ([place]) 11/11/2012  . Breast cancer of lower-outer quadrant of right female breast ([place]) 10/14/2012  . Olecranon bursitis of right elbow 10/11/2012  . Right shoulder pain 04/09/2012  . Asthma exacerbation 12/28/2011  . Cellulitis, abdominal wall 12/28/2011  . Hemorrhoids 12/05/2011  .  Preventative health care 12/29/2010  . Multinodular goiter 11/17/2010  . Chronic diastolic heart failure ([place]) 11/27/2009  . DIABETIC  RETINOPATHY 11/14/2009  . Elevated lipids 11/14/2009  . Anemia in neoplastic disease 11/14/2009  . INCREASED INTRAOCULAR PRESSURE 11/14/2009  . MYOCARDIAL INFARCTION, HX OF 11/14/2009  . Coronary atherosclerosis 11/14/2009  . Peptic ulcer 11/14/2009  . Gastroparesis 11/14/2009  . LOW BACK PAIN, CHRONIC 11/14/2009  . TRANSIENT ISCHEMIC ATTACK, HX OF 11/14/2009  . COLONIC POLYPS, HX OF 11/14/2009  . Edema 10/03/2009  . Type 2 diabetes mellitus with circulatory disorder ([place]) 07/14/2009  . Hypertensive heart disease 07/14/2009  . MYOCARDIAL INFARCTION, ACUTE, SUBENDOCARDIAL 07/14/2009  . CAD, NATIVE VESSEL 07/14/2009  . Asthma 07/14/2009  . GERD 07/14/2009  . NEPHROLITHIASIS, HX OF 07/14/2009  . KIDNEY TRANSPLANTATION 07/14/2009    Past Surgical History:  Procedure Laterality Date  . BREAST BIOPSY  2010  . BREAST LUMPECTOMY Right 11/09/2012  . BREAST LUMPECTOMY WITH SENTINEL LYMPH NODE BIOPSY Right 11/09/2012   Procedure: RIGHT BREAST LUMPECTOMY WITH SENTINEL LYMPH NODE REMOVAL;  Surgeon: Haywood Lasso, MD;  Location: [facility];  Service: General;  Laterality: Right;  . CARDIAC CATHETERIZATION  04-12-2009  DR EVELYNE GOUDREAU ([facility] IN [place], [place])   NON-OBSTRUCTIVE CAD/  mLAD 40%,  oLAD 50%,  dCFX 40%,  OM1  40%,  mRCA  &  dRCA  50%/  LVEF 65% /  ELEVATED  LVEDP  . CARDIOVASCULAR STRESS TEST  11-04-2012  DR NISHAN   HIGH RISK NUCLEAR STUDY/  FIXED DEFECT AFFECTING ENTIRE INFEROLATERAL AND ANTEROLATERAL WALL/  MODERATE ISCHEMIA  AT MID/ APICAL ANTEROR AND INFERIOR WALL/  GLOBAL HYPOKINESIS/  LVEF 28% (FELT TO BE FALSE +,  ECHO & cMRI  NORMAL EF AND NORMAL WM)  . CATARACT EXTRACTION W/ INTRAOCULAR LENS  IMPLANT, BILATERAL    . CHOLECYSTECTOMY  2008  . EYE SURGERY Left 2003   REPAIR CORNEA  . FOOT SURGERY Right 2013  . HEMORRHOID SURGERY  01/23/2012    Procedure: HEMORRHOIDECTOMY PROLAPSED;  Surgeon: Leighton Ruff, MD;  Location[facility];  Service: General;  Laterality: N/A;  . INCISION AND DRAINAGE OF WOUND Left 02/18/2013   Procedure: IRRIGATION AND DEBRIDEMENT THIGH WOUND;  Surgeon: Rolm Bookbinder, MD;  Location: [facility];  Service: General;  Laterality: Left;  . IRRIGATION AND DEBRIDEMENT ABSCESS Left 02/14/2013   Procedure: IRRIGATION AND DEBRIDEMENT LEFT THIGH ABSCESS;  Surgeon: Joyice Faster. Cornett, MD;  Location: [facility];  Service: General;  Laterality: Left;  . PORTACATH PLACEMENT Right 11/30/2012   Procedure: INSERTION PORT-A-CATH;  Surgeon: Haywood Lasso, MD;  Location: [facility];  Service: General;  Laterality: Right;  . REPAIR  INTESTINAL PERFORATION SURGERY  01-31-2009  . SKIN SPLIT GRAFT Left 03/29/2013   Procedure:  SKIN GRAFT SPLIT THICKNESS WITH A-CELL AND VAC TO LEFT THIGH;  Surgeon: Theodoro Kos, DO;  Location: [facility];  Service: Plastics;  Laterality: Left;  . TRANSPLANTATION RENAL  05/2006   CADAVERIC  . TRANSTHORACIC ECHOCARDIOGRAM  01-19-2013  DR NISHAN   SEVERE LVH/  EF 86-76%/  GRADE I DIASTOLIC DYSFUNCTION/  MODERATE LAE/  MILD IMPROVEMENT OF MODERATE CIRCUMFERENTIAL INFERIOROLATERAL PERICARDIAL EFFUSION WITH NO TAMPONADE  . VAGINAL HYSTERECTOMY  1990's   W/  BILATERAL SALPINGOOPHORECTOMY    OB History    No data available       Home Medications    Prior to Admission medications   Medication Sig Start Date End Date Taking? Authorizing Provider  albuterol (ACCUNEB) 0.63 MG/3ML nebulizer solution Take 1 ampule by nebulization every 6 (six) hours as needed for wheezing.    [provider]  albuterol (PROVENTIL HFA;VENTOLIN HFA) 108 (90 BASE) MCG/ACT inhaler Inhale 2 puffs into the lungs every 6 (six) hours as needed for wheezing or shortness of breath. 01/04/14   Biagio Borg, MD  allopurinol (ZYLOPRIM) 100 MG tablet Take 100 mg by mouth daily.  10/06/15   [provider]  amLODipine (NORVASC) 5 MG tablet Take 5 mg by mouth every evening.     [provider]  colchicine 0.6 MG tablet Take 0.6 mg by mouth daily.    [provider]  diphenhydramine-acetaminophen (TYLENOL PM) 25-500 MG TABS tablet Take 1 tablet by mouth at bedtime as needed.    [provider]  docusate sodium (COLACE) 100 MG capsule Take 100 mg by mouth 2 (two) times daily.    [provider]  feeding supplement, GLUCERNA SHAKE, (GLUCERNA SHAKE) LIQD Take 237 mLs by mouth 2 (two) times daily between meals. Monica Lee not taking: Reported on 10/20/2015 02/23/13   Hosie Poisson, MD  furosemide (LASIX) 80 MG tablet Take 160 mg by mouth 3 (three) times daily.    [provider]  insulin NPH-regular Human (NOVOLIN 70/30) (70-30) 100 UNIT/ML injection 15-40 units inject under skin twice a day Monica Lee taking differently: Inject 30 Units into the skin 2 (two) times daily with a meal.  08/25/14   Philemon Kingdom, MD  isosorbide mononitrate (IMDUR) 60 MG 24 hr tablet TAKE 1 TABLET BY MOUTH EVERY DAY**MAKE APPT FOR PHYSICAL AND LABS**FOR FURTURE FILLS 03/07/16   Causey, Charlestine Massed, NP  KLOR-CON M20 20 MEQ tablet Take 20 mEq by mouth 2 (two) times daily.  02/11/13   [provider]  levothyroxine (SYNTHROID, LEVOTHROID) 75 MCG tablet Take 1 tablet (75 mcg total) by mouth daily. Monica Lee taking differently: Take 100 mcg by mouth daily.  03/25/13   Biagio Borg, MD  metoprolol tartrate (LOPRESSOR) 100 MG tablet TAKE 1 TABLET(100 MG) BY MOUTH TWICE DAILY(MUST CALL MD FOR APPT FOR FUTURE FILLS) 06/12/16   Josue Hector, MD  mycophenolate (CELLCEPT) 500 MG tablet Take 1,000 mg by mouth 2 (two) times daily. 09/28/15   [provider]  nitroGLYCERIN (NITROSTAT) 0.4 MG SL tablet DISSOLVE 1 TABLET UNDER THE TONGUE EVERY 5 MINUTES AS NEEDED FOR CHEST PAIN DO NOT EXCEED 3 DOSES 03/29/16   Josue Hector, MD  OXYGEN Inhale 2 L  into the lungs daily as needed.    [provider]  polyethylene glycol powder (GLYCOLAX/MIRALAX) powder MIX 17GM AS DIRECTED DAILY Monica Lee not taking: Reported on 10/20/2015 01/30/15   Biagio Borg, MD  pravastatin (PRAVACHOL) 20 MG tablet Take 10 mg by mouth at bedtime.  [provider]  predniSONE (DELTASONE) 10 MG tablet TAKE 1 TABLET(10 MG) BY MOUTH EVERY MORNING 07/26/15   Biagio Borg, MD  tacrolimus (PROGRAF) 1 MG capsule Take 9 mg by mouth 2 (two) times daily.     [provider]  timolol (TIMOPTIC) 0.25 % ophthalmic solution Place 1 drop into both eyes 2 (two) times daily. 09/11/15   [provider]  traMADol (ULTRAM) 50 MG tablet Take 1 tablet (50 mg total) by mouth every 6 (six) hours as needed. Monica Lee not taking: Reported on 10/20/2015 08/21/15   Etta Quill, NP    Family History Family History  Problem Relation Age of Onset  . Heart disease Father   . Arthritis Other        parent  . Heart disease Other        parent  . Hypertension Other        parent  . Diabetes Other        parent  . Diabetes Other        grandparent  . Thyroid disease Other        several  . Kidney disease Neg Hx     Social History Social History   Tobacco Use  . Smoking status: Never Smoker  . Smokeless tobacco: Never Used  Substance Use Topics  . Alcohol use: No  . Drug use: No     Allergies   2,4-d dimethylamine (amisol); Keflex [cephalexin]; Propoxyphene n-acetaminophen; and Vancomycin   Review of Systems Review of Systems  Constitutional: Negative for chills and fever.  HENT: Positive for facial swelling. Negative for congestion and sore throat.   Eyes: Positive for visual disturbance. Negative for pain.  Respiratory: Negative for cough and shortness of breath.   Cardiovascular: Negative for chest pain and palpitations.  Gastrointestinal: Negative for abdominal pain, diarrhea, nausea and vomiting.  Genitourinary: Negative for dysuria.    Musculoskeletal: Negative for back pain and neck pain.  Skin: Negative for rash.  Neurological: Positive for numbness and headaches. Negative for dizziness, syncope and weakness.     Physical Exam Updated Vital Signs BP 106/81 (BP Location: Right Arm)   Pulse 76   Temp 98.4 F (36.9 C) (Oral)   Resp 16   Ht 5\' 5"  (1.651 m)   Wt 68.9 kg (152 lb)   SpO2 100%   BMI 25.29 kg/m   Physical Exam  Constitutional: She appears well-developed and well-nourished. No distress.  Nontoxic-appearing.  HENT:  Head: Normocephalic and atraumatic.  Eyes: Right eye exhibits no discharge.  Left eye proptosis. Unable to move left eyelids. EOMs diminished on left. Unable to see out of left eye.   Neck: Neck supple.  Cardiovascular: Normal rate, regular rhythm, normal heart sounds and intact distal pulses.  Pulmonary/Chest: Effort normal and breath sounds normal. No respiratory distress.  Abdominal: Soft. There is no tenderness.  Musculoskeletal: She exhibits no edema or tenderness.  Lymphadenopathy:    She has no cervical adenopathy.  Neurological: She is alert. Coordination normal.  Unable to see out left eye. EOMs diminished on left. Decreased sensation surrounding left eye. Tongue protrusion normal. Speech is clear and coherent.  Good and equal grip strengths bilaterally.  Good strength to her bilateral upper and lower extremities.  Skin: Skin is warm and dry. Capillary refill takes less than 2 seconds. No rash noted. She is not diaphoretic. No erythema. No pallor.  Psychiatric: She has a normal mood and affect. Her behavior is normal.  Nursing note and  vitals reviewed.    ED Treatments / Results  Labs (all labs ordered are listed, but only abnormal results are displayed) Labs Reviewed  CBC WITH DIFFERENTIAL/PLATELET - Abnormal; Notable for the following components:      Result Value   RDW 15.7 (*)    All other components within normal limits  I-STAT CHEM 8, ED - Abnormal; Notable for  the following components:   Chloride 100 (*)    Glucose, Bld 257 (*)    All other components within normal limits  CBG MONITORING, ED - Abnormal; Notable for the following components:   Glucose-Capillary 185 (*)    All other components within normal limits    EKG  EKG Interpretation None       Radiology No results found.  Procedures Procedures (including critical care time)  Medications Ordered in ED Medications - No data to display   Initial Impression / Assessment and Plan / ED Course  I have reviewed the triage vital signs and the nursing notes.  Pertinent labs & imaging results that were available during my care of the Monica Lee were reviewed by me and considered in my medical decision making (see chart for details).     This is a 67 y.o. Female who presents to the ED after being sent by her ophthalmologist for possible APEX syndrome. Monica Lee reports she has had a left sided headache for several weeks and has had two weeks of left eye pain. She reports for the past week she has had numbness around her left eye and left forehead. She reports she is chronically unable to see out of her left eye. She went to her eye doctor today due to the headaches and eye symptoms and was directed to the ED. Monica Lee denies other complaints. On exam the Monica Lee is afebrile and non-toxic appearing. She has proptosis of her left eye. She is unable to see out of her left eye which she reports is chronic.  She has decreased sensation around her left eye.  Dr. Alvino Chapel spoke with radiology who suggested MRI orbits with and without contrast.  Blood work is remarkable for glucose of 257.  Repeat CBG is 185.  Kidney function shows a creatinine of 0.70. At shift change Monica Lee is awaiting MRI of her orbits.  Monica Lee care signed out to Adrian Prows, MD at shift change.   Final Clinical Impressions(s) / ED Diagnoses   Final diagnoses:  Proptosis    ED Discharge Orders    None       Sharmaine Base 01/20/17 1545    Carmin Muskrat, MD 01/20/17 2304

## 2017-01-20 NOTE — Consult Note (Signed)
Reason for Consult: Left eye swelling Referring Physician: Carmin Muskrat, MD  Monica Lee is an 67 y.o. female.  HPI: 2-week history of swelling of the left eye.  Long history of near complete visual loss on the left related to complications from diabetes.  About 1 week ago she lost all light perception.  The swelling has continued to worsen throughout the past couple of weeks.  She is on immunosuppressant medication for renal transplant from many years ago.  She is a poorly controlled diabetic.  She was seen in the emergency department this evening and underwent MRI and CT of the sinuses and orbits.  This reveals inflammatory sinus disease on the left side sphenoid and ethmoid, with bone thickening, possible dural enhancement, and proptosis of the left eye.  Past Medical History:  Diagnosis Date  . Anemia in chronic kidney disease   . Asthma   . At risk for sleep apnea    STOP-BANG= 4   SENT TO PCP  03-25-2013  . Blind left eye    OCCLUDED CORNEA,  FUNDI--  FAILED SURGERY REPAIR 2003  . Breast cancer ([place]) DX  OCT 2014  ---  STAGE IIA  DCIS (T2  N0)   11-09-2012  RIGHT BREAST LUMPECTOMY W/ SLN DISSECTION---  CHEMOTHERAPY  . Chronic diastolic CHF (congestive heart failure) ([place])    a. Cardiac MRI (10/2012): Moderate to severe LVH, EF 55%, chordal SAM but no LVOT gradient, mod circumferential effusion, mild LAE.  b.  Echocardiogram (11/06/12): Severe LVH, EF 10-62%, grade 1 diastolic dysfunction, moderate effusion.  . Chronic kidney disease (CKD), stage III (moderate) ([place])    NEPHROLOGIST--  DR Freddrick March  . Coronary artery disease CARDIOLOGIST--  DR Johnsie Cancel   NON-OBSTRUCTIVE CAD  PER CARDIAC CATH  2011  . Diabetic retinopathy    RIGHT EYE  . DJD (degenerative joint disease)   . Gastroparesis due to DM ([place])   . GERD (gastroesophageal reflux disease)   . History of CVA (cerebrovascular accident) without residual deficits    2008  AND TIA IN 2008  W/ NO RESIDUAL  . History of kidney  stones   . History of myocardial infarction    2011--  S/P CARDIAC CATH ([place])  NON-OBSTRUCTIVE CAD  . History of peptic ulcer    2011--  RESOLVED  . Hx of colonic polyps   . Hyperlipidemia   . Hyperparathyroidism, secondary renal ([place])   . Hypertension   . Hypothyroidism   . Open thigh wound    ANTERIOR  . Pericardial effusion without cardiac tamponade    PER DR NISHAN  NOTE --  NOT MALIGNANT  ? RELATED TO HYPOTHYROIDISM  . Personal history of chemotherapy   . Personal history of radiation therapy   . S/P kidney transplant    CADAVERIC --  05/2006  . Thyromegaly    MULTINODULER GOITER  . Trigeminal neuralgia   . 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 Wears glasses     Past Surgical History:  Procedure Laterality Date  . BREAST BIOPSY  2010  . BREAST LUMPECTOMY Right 11/09/2012  . BREAST LUMPECTOMY WITH SENTINEL LYMPH NODE BIOPSY Right 11/09/2012   Procedure: RIGHT BREAST LUMPECTOMY WITH SENTINEL LYMPH NODE REMOVAL;  Surgeon: Haywood Lasso, MD;  Location: [place];  Service: General;  Laterality: Right;  . CARDIAC CATHETERIZATION  04-12-2009  DR EVELYNE GOUDREAU ([facility] IN [place], [place])   NON-OBSTRUCTIVE CAD/  mLAD 40%,  oLAD 50%,  dCFX 40%,  OM1  40%,  mRCA  &  dRCA  50%/  LVEF 65% /  ELEVATED  LVEDP  . CARDIOVASCULAR STRESS TEST  11-04-2012  DR NISHAN   HIGH RISK NUCLEAR STUDY/  FIXED DEFECT AFFECTING ENTIRE INFEROLATERAL AND ANTEROLATERAL WALL/  MODERATE ISCHEMIA  AT MID/ APICAL ANTEROR AND INFERIOR WALL/  GLOBAL HYPOKINESIS/  LVEF 28% (FELT TO BE FALSE +,  ECHO & cMRI  NORMAL EF AND NORMAL WM)  . CATARACT EXTRACTION W/ INTRAOCULAR LENS  IMPLANT, BILATERAL    . CHOLECYSTECTOMY  2008  . EYE SURGERY Left 2003   REPAIR CORNEA  . FOOT SURGERY Right 2013  . HEMORRHOID SURGERY  01/23/2012   Procedure: HEMORRHOIDECTOMY PROLAPSED;  Surgeon: Leighton Ruff, MD;  Location: [facility];  Service: General;  Laterality: N/A;  . INCISION AND  DRAINAGE OF WOUND Left 02/18/2013   Procedure: IRRIGATION AND DEBRIDEMENT THIGH WOUND;  Surgeon: Rolm Bookbinder, MD;  Location: [place];  Service: General;  Laterality: Left;  . IRRIGATION AND DEBRIDEMENT ABSCESS Left 02/14/2013   Procedure: IRRIGATION AND DEBRIDEMENT LEFT THIGH ABSCESS;  Surgeon: Joyice Faster. Cornett, MD;  Location: [place];  Service: General;  Laterality: Left;  . PORTACATH PLACEMENT Right 11/30/2012   Procedure: INSERTION PORT-A-CATH;  Surgeon: Haywood Lasso, MD;  Location: [place];  Service: General;  Laterality: Right;  . REPAIR  INTESTINAL PERFORATION SURGERY  01-31-2009  . SKIN SPLIT GRAFT Left 03/29/2013   Procedure: SKIN GRAFT SPLIT THICKNESS WITH A-CELL AND VAC TO LEFT THIGH;  Surgeon: Theodoro Kos, DO;  Location: [place] [facility];  Service: Plastics;  Laterality: Left;  . TRANSPLANTATION RENAL  05/2006   CADAVERIC  . TRANSTHORACIC ECHOCARDIOGRAM  01-19-2013  DR NISHAN   SEVERE LVH/  EF 57-84%/  GRADE I DIASTOLIC DYSFUNCTION/  MODERATE LAE/  MILD IMPROVEMENT OF MODERATE CIRCUMFERENTIAL INFERIOROLATERAL PERICARDIAL EFFUSION WITH NO TAMPONADE  . VAGINAL HYSTERECTOMY  1990's   W/  BILATERAL SALPINGOOPHORECTOMY    Family History  Problem Relation Age of Onset  . Heart disease Father   . Arthritis Other        parent  . Heart disease Other        parent  . Hypertension Other        parent  . Diabetes Other        parent  . Diabetes Other        grandparent  . Thyroid disease Other        several  . Kidney disease Neg Hx     Social History:  reports that  has never smoked. she has never used smokeless tobacco. She reports that she does not drink alcohol or use drugs.  Allergies:  Allergies  Allergen Reactions  . 2,4-D Dimethylamine (Amisol) Hives  . Keflex [Cephalexin] Swelling    Swelling to face, chills Tolerates zosyn 02/14/13  . Propoxyphene N-Acetaminophen Hives and Itching    Patient can tolerate acetaminophen solely  .  Vancomycin Itching    Itching all over when vancomycin given 09/12/14    Medications: Reviewed  Results for orders placed or performed during the hospital encounter of 01/20/17 (from the past 48 hour(s))  CBC with Differential     Status: Abnormal   Collection Time: 01/20/17 12:20 PM  Result Value Ref Range   WBC 7.4 4.0 - 10.5 K/uL   RBC 4.53 3.87 - 5.11 MIL/uL   Hemoglobin 12.9 12.0 - 15.0 g/dL   HCT 42.7 36.0 - 46.0 %  MCV 94.3 78.0 - 100.0 fL   MCH 28.5 26.0 - 34.0 pg   MCHC 30.2 30.0 - 36.0 g/dL   RDW 15.7 (H) 11.5 - 15.5 %   Platelets 316 150 - 400 K/uL   Neutrophils Relative % 72 %   Neutro Abs 5.4 1.7 - 7.7 K/uL   Lymphocytes Relative 19 %   Lymphs Abs 1.4 0.7 - 4.0 K/uL   Monocytes Relative 8 %   Monocytes Absolute 0.6 0.1 - 1.0 K/uL   Eosinophils Relative 1 %   Eosinophils Absolute 0.1 0.0 - 0.7 K/uL   Basophils Relative 0 %   Basophils Absolute 0.0 0.0 - 0.1 K/uL  I-stat Chem 8, ED     Status: Abnormal   Collection Time: 01/20/17 12:38 PM  Result Value Ref Range   Sodium 137 135 - 145 mmol/L   Potassium 4.6 3.5 - 5.1 mmol/L   Chloride 100 (L) 101 - 111 mmol/L   BUN 14 6 - 20 mg/dL   Creatinine, Ser 0.70 0.44 - 1.00 mg/dL   Glucose, Bld 257 (H) 65 - 99 mg/dL   Calcium, Ion 1.23 1.15 - 1.40 mmol/L   TCO2 28 22 - 32 mmol/L   Hemoglobin 14.6 12.0 - 15.0 g/dL   HCT 43.0 36.0 - 46.0 %  CBG monitoring, ED     Status: Abnormal   Collection Time: 01/20/17  2:58 PM  Result Value Ref Range   Glucose-Capillary 185 (H) 65 - 99 mg/dL   Comment 1 Notify RN    Comment 2 Document in Chart   CBG monitoring, ED     Status: Abnormal   Collection Time: 01/20/17  8:17 PM  Result Value Ref Range   Glucose-Capillary 203 (H) 65 - 99 mg/dL    Ct Maxillofacial Wo Cm  Addendum Date: 01/20/2017   ADDENDUM REPORT: 01/20/2017 21:01 ADDENDUM: Study discussed by telephone with Dr. Carmin Muskrat on 01/20/2017 at 2030 hours. Electronically Signed   By: Genevie Ann M.D.   On: 01/20/2017  21:01   Result Date: 01/20/2017 CLINICAL DATA:  67 year old diabetic female with left eye swelling and drainage for 2 weeks. Abnormal orbit MRI today with possible osteomyelitis in dehiscence of the medial left orbital wall. Underlying left phthisis bulbi, blindness. EXAM: CT MAXILLOFACIAL WITHOUT CONTRAST TECHNIQUE: Multidetector CT imaging of the maxillofacial structures was performed. Multiplanar CT image reconstructions were also generated. COMPARISON:  Orbit MRI 1714 hours today. CT head without contrast 10/20/2015. FINDINGS: Orbits: Chronic abnormal sclerosis and periostitis along the walls of the left sphenoid sinus. There is evidence for lateral sphenoid wall dehiscence near the left orbital apex (series 5, image 59), and cephalad to the left pterygopalatine fossa. There is also subtle dehiscence suspected along the posterosuperior lamina papyracea near the anterior ethmoidal artery position on series 9, image 27. However, the remaining left orbital walls appear intact. Abnormal confluent soft tissue stranding an intermediate density throughout the left orbital apex (series 3, image 59), the posteromedial left extraconal space (series 3, image 60) the left pterygopalatine fossa (series 3, image 54) and tracking from their laterally into the superior left retro maxillary space (coronal series 7, image 38) corresponding to the abnormal findings on MRI today. Superimposed chronic left phthisis bulbi. The right orbit appears stable since 2017 and normal. Other Osseous: Generalized chronic skullbase sclerosis. The small areas of dehiscence are noted about the left orbit stated above, but there is not underlying permeative change in the left skullbase or facial bones. Partially visible C5-C6  ankylosis and chronic lower cervical disc and endplate degeneration. Sinuses: Abnormal opacified left ethmoid and sphenoid sinuses in conjunction with the abnormal left orbit. The left maxillary sinus is well pneumatized,  although there are retro maxillary soft tissue changes associated with the above. The left frontal sinus is well pneumatized except for the left frontoethmoidal recess. Only mild mucosal thickening occasionally in the right paranasal sinuses. Tympanic cavities are clear. Chronic mastoid sclerosis appears stable. Soft tissues: Visible noncontrast larynx, pharynx, parapharyngeal spaces, sublingual space, and parotid spaces are normal. The visible retropharyngeal space is normal aside from a retropharyngeal course of both carotids. Both submandibular glands appear atrophied, otherwise negative submandibular spaces. No upper cervical lymphadenopathy. Widespread calcified atherosclerosis in the upper neck and at the skullbase. Limited intracranial: Better evaluated by MRI today. Negative noncontrast CT appearance of the visible brain parenchyma. IMPRESSION: 1. Positive for complicated left ethmoid and sphenoid sinusitis as demonstrated by Orbit MRI today. There is focal Osteomyelitis with subtle dehiscence at both: - The superolateral wall of the left sphenoid sinus (adjacent to the left orbital apex and near the left pterygopalatine fossa), and - The left superior lamina papyracea it near the anterior ethmoidal artery. 2. Although Invasive Fungal Sinusitis (Mucormycosis) is possible and empiric treatment for it is recommended, symptoms over a 2 week period would be more compatible with a complicated but more indolent sinus infection. 3. Underlying left phthisis bulbi. Electronically Signed: By: Genevie Ann M.D. On: 01/20/2017 20:26   Mr Rosealee Albee FU Contrast  Result Date: 01/20/2017 CLINICAL DATA:  Proptosis and limited left eye movement. EXAM: MRI OF THE ORBITS WITHOUT AND WITH CONTRAST TECHNIQUE: Multiplanar, multisequence MR imaging of the orbits was performed both before and after the administration of intravenous contrast. CONTRAST:  46mL MULTIHANCE GADOBENATE DIMEGLUMINE 529 MG/ML IV SOLN COMPARISON:  Head CT  10/20/2015 FINDINGS: Orbits: --Globes: Chronic postsurgical appearance of the deformed left lobe is unchanged compared to 10/20/2015. --Bony orbit: There is bulging laterally of the medial wall of the left orbit narrowing the left orbital apex, unchanged from the prior study. --Preseptal soft tissues: Normal. --Intra- and extraconal orbital fat: There is loss of the normal fat signal of the left orbital apex with associated hyper enhancement following contrast material administration. The more anterior intraconal fat is relatively spared. --Optic nerves: There is left perineural hyperenhancement and chronic atrophy of the left optic nerve. --Lacrimal glands and fossae: Normal. --Extraocular muscles: There is edema and hyperenhancement of the left extraocular muscles, worst at the inferior rectus. The findings are most pronounced near the orbital apex. Visualized sinuses: There is extensive left-sided sinus disease predominantly involving the left ethmoid and sphenoid sinuses. This is associated with mucosal hyperenhancement and areas of possible dehiscence along the lateral wall of the left sphenoid sinus (best demonstrated on series 8 images 8-10). Soft tissues: Loss of normal signal in the left sphenopalatine foramen. Limited intracranial: There is thickening of the dura of the left middle cranial fossa. Cavernous carotid flow voids are maintained. Old right cerebellar infarct. IMPRESSION: 1. Complicated left sphenoethmoidal sinusitis with inflammatory changes of the structures at the left orbital apex and findings concerning for dehiscence of the medial orbital wall. In a diabetic patient, an invasive fungal sinusitis must be considered. A dedicated maxillofacial CT is recommended to more completely assess for osseous dehiscence. 2. Left middle cranial fossa dural thickening, likely reactive to the above-described process. 3. Chronic postsurgical appearance of the left globe. Maxillofacial CT may also be helpful  to assess for  potential intracranial spread. Electronically Signed   By: Ulyses Jarred M.D.   On: 01/20/2017 18:41    NAT:[id number] except as listed in admit H&P  Blood pressure 106/81, pulse 76, temperature 98.4 F (36.9 C), temperature source Oral, resp. rate 16, height 5\' 5"  (1.651 m), weight 68.9 kg (152 lb), SpO2 100 %.  PHYSICAL EXAM: Overall appearance: Sleepy, resting comfortably, in no distress Head:  Normocephalic, atraumatic. Ears: External ears look normal. Eyes: Left eye with moderate proptosis.  Pupil is dilated.  Extraocular muscle activity is normal on the right, there is no movement on the left in any direction.  There is no light reaction.  She does not have light perception. Nose: External nose is healthy in appearance. Internal nasal exam free of any lesions or obstruction. Oral Cavity/Pharynx:  There are no mucosal lesions or masses identified.  There is no ecchymosis or edema of the palate mucosa. Larynx/Hypopharynx: Deferred Neuro:  No identifiable neurologic deficits other than the left eye findings. Neck: No palpable neck masses.  Studies Reviewed: MRI and CT.          Procedures: none   Assessment/Plan: Left sphenoid and ethmoid sinusitis of unknown duration.  Complete visual loss on the left, severe premorbid visual loss from other pathology for many years.  Given her company to history of immunosuppressive therapy for renal transplant and the poorly controlled diabetes, she is at risk for mucomycosis.  Most of her care has been at [facility] past couple of years.  Recommend transfer there for further evaluation and management.  I discussed with the family that it may be that urgent surgical intervention will be necessary this evening in order to diagnose Mucor and to debride the disease tissue.  On the other hand, if the clinical picture does not fit mucosa, than medical intervention may be sufficient.  Monica Lee 01/20/2017, 10:12 PM

## 2017-01-20 NOTE — ED Notes (Signed)
Returned from MRI 

## 2017-01-21 DIAGNOSIS — N189 Chronic kidney disease, unspecified: Secondary | ICD-10-CM | POA: Diagnosis not present

## 2017-01-21 DIAGNOSIS — J45909 Unspecified asthma, uncomplicated: Secondary | ICD-10-CM | POA: Diagnosis not present

## 2017-01-21 DIAGNOSIS — E119 Type 2 diabetes mellitus without complications: Secondary | ICD-10-CM | POA: Diagnosis not present

## 2017-01-21 DIAGNOSIS — E039 Hypothyroidism, unspecified: Secondary | ICD-10-CM | POA: Diagnosis not present

## 2017-01-21 DIAGNOSIS — R51 Headache: Secondary | ICD-10-CM | POA: Diagnosis present

## 2017-01-21 DIAGNOSIS — I5032 Chronic diastolic (congestive) heart failure: Secondary | ICD-10-CM | POA: Diagnosis not present

## 2017-01-21 DIAGNOSIS — Z853 Personal history of malignant neoplasm of breast: Secondary | ICD-10-CM | POA: Diagnosis not present

## 2017-01-21 DIAGNOSIS — H0520 Unspecified exophthalmos: Secondary | ICD-10-CM | POA: Diagnosis not present

## 2017-01-21 DIAGNOSIS — Z794 Long term (current) use of insulin: Secondary | ICD-10-CM | POA: Diagnosis not present

## 2017-01-21 DIAGNOSIS — Z79899 Other long term (current) drug therapy: Secondary | ICD-10-CM | POA: Diagnosis not present

## 2017-01-21 DIAGNOSIS — I130 Hypertensive heart and chronic kidney disease with heart failure and stage 1 through stage 4 chronic kidney disease, or unspecified chronic kidney disease: Secondary | ICD-10-CM | POA: Diagnosis not present

## 2017-01-21 MED ORDER — MORPHINE SULFATE (PF) 4 MG/ML IV SOLN
4.0000 mg | Freq: Once | INTRAVENOUS | Status: AC
  Administered 2017-01-21: 4 mg via INTRAVENOUS
  Filled 2017-01-21: qty 1

## 2017-01-21 NOTE — ED Notes (Signed)
Patient being transferred to [facility] for orbital cellulitis and osteomyelitis.  She has required 2 injections of morphine for pain control.  She is resting comfortably and in no acute distress at time of transfer.   Delora Fuel, MD 67/20/91 [id number]

## 2017-01-21 NOTE — ED Notes (Signed)
[facility] arrived to transport patient.  

## 2017-03-07 ENCOUNTER — Encounter: Payer: Medicare Other | Admitting: Adult Health

## 2017-12-01 ENCOUNTER — Other Ambulatory Visit: Payer: Self-pay | Admitting: Adult Health

## 2017-12-01 DIAGNOSIS — Z853 Personal history of malignant neoplasm of breast: Secondary | ICD-10-CM

## 2018-01-23 ENCOUNTER — Ambulatory Visit
Admission: RE | Admit: 2018-01-23 | Discharge: 2018-01-23 | Disposition: A | Discharge status: Home / Self Care | Payer: Medicare PPO | Source: Ambulatory Visit | Attending: Adult Health | Admitting: Adult Health

## 2018-01-23 ENCOUNTER — Other Ambulatory Visit: Payer: Self-pay | Admitting: Adult Health

## 2018-01-23 DIAGNOSIS — Z853 Personal history of malignant neoplasm of breast: Secondary | ICD-10-CM

## 2018-05-26 IMAGING — MR MR ORBITS WO/W CM
4 of 10 series · 18 of 48 positions shown · IV contrast (15 MH)
Comparison: Head CT 10/20/2015

CLINICAL DATA: Proptosis and limited left eye movement.

EXAM:
MRI OF THE ORBITS WITHOUT AND WITH CONTRAST
TECHNIQUE: Multiplanar, multisequence MR imaging of the orbits was performed
both before and after the administration of intravenous contrast.
CONTRAST:  15mL MULTIHANCE GADOBENATE DIMEGLUMINE 529 MG/ML IV SOLN

[Series 2: FLAIR · sagittal · 5.0mm · 0.47mm/px · 5 of 23 slices shown]
[im 1/23]
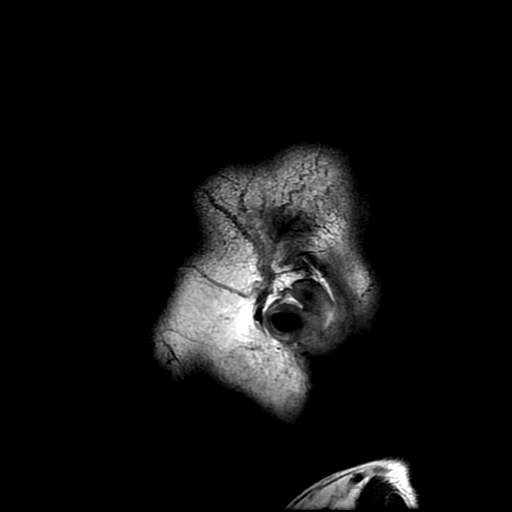
[im 6/23]
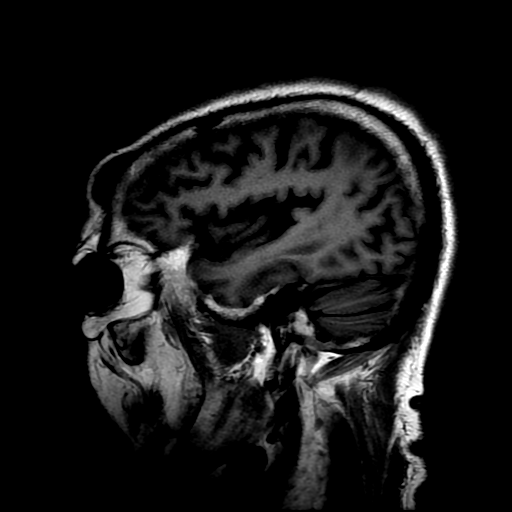
[im 12/23]
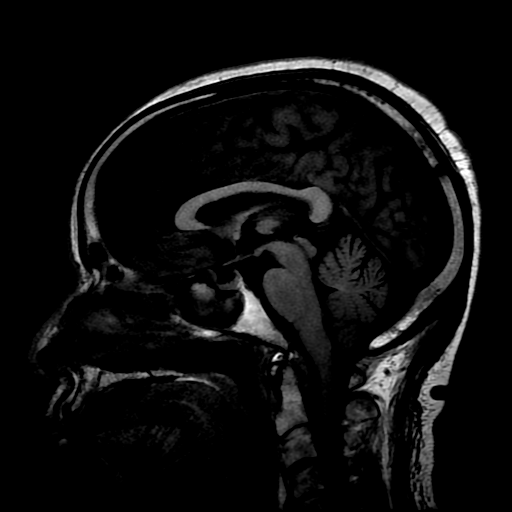
[im 17/23]
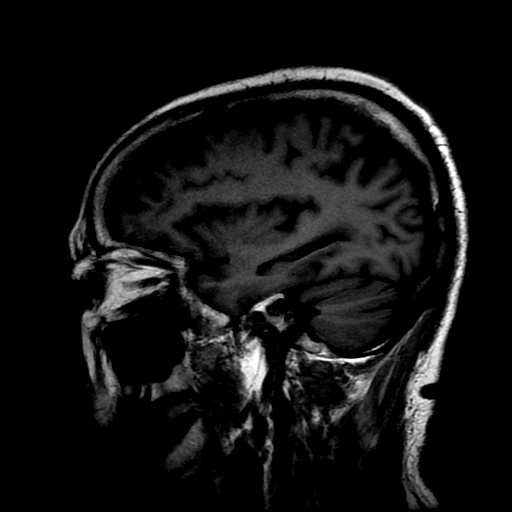
[im 23/23]
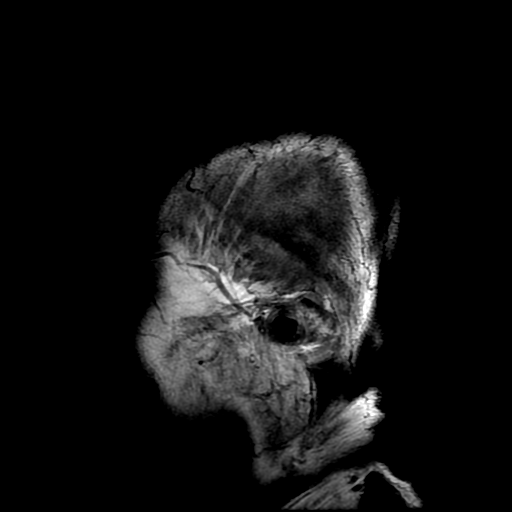

[Series 4: T2 · axial · 5.0mm · 0.47mm/px · z∈[-78,+14]mm · 3 of 17 slices shown]
[im 1/17]
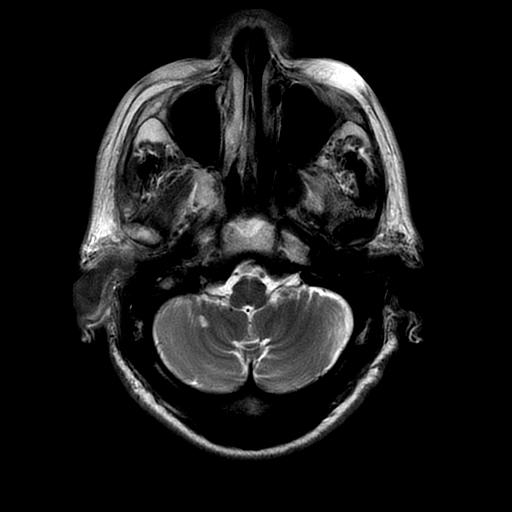
[im 9/17]
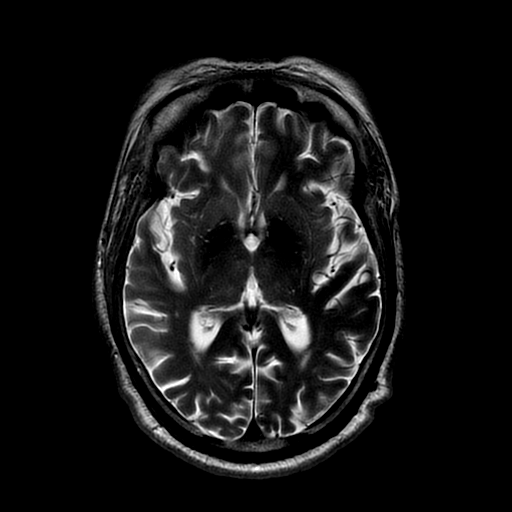
[im 17/17]
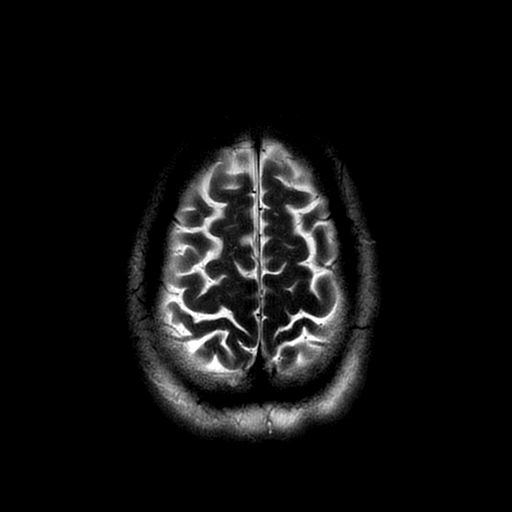

[Series 5: T2 fat-sat · axial · 3.0mm · 0.35mm/px · z∈[-81,-7]mm · 5 of 26 slices shown (1 of 2)]
[im 1/26]
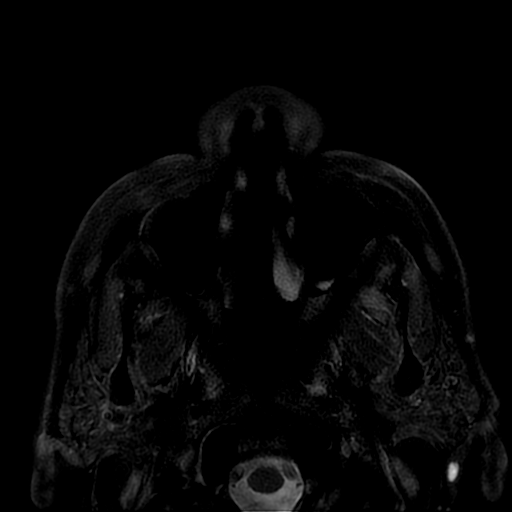
[im 7/26]
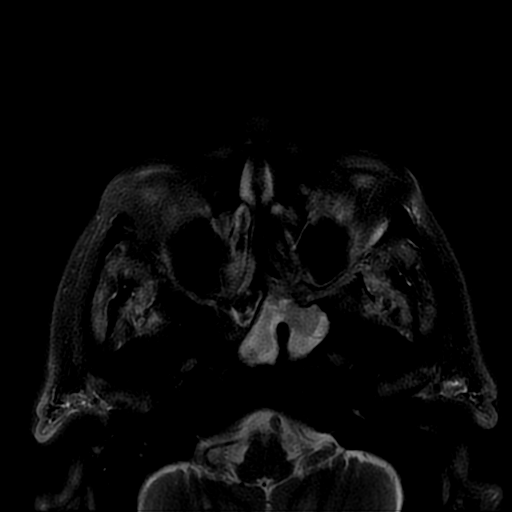
[im 13/26]
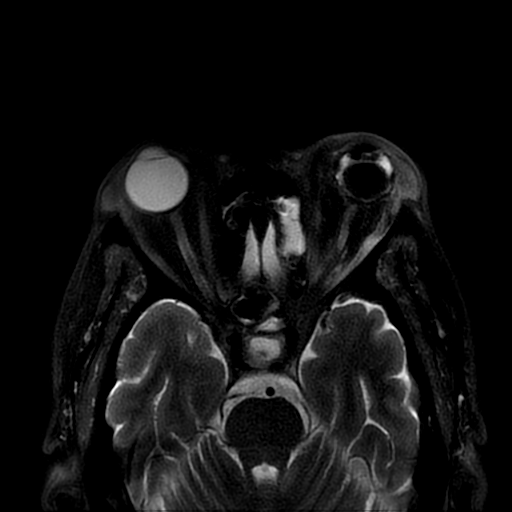
[im 19/26]
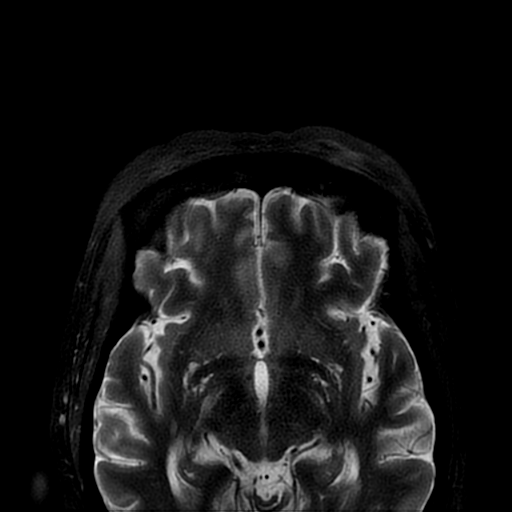
[im 26/26]
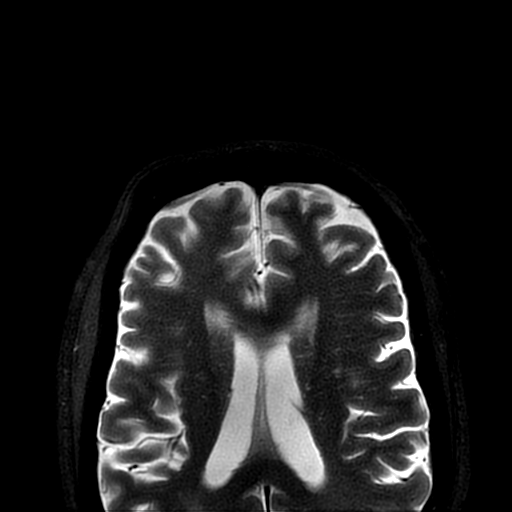

[Series 6: T2 fat-sat · coronal · 4.0mm · 0.35mm/px · 5 of 29 slices shown (2 of 2)]
[im 1/29]
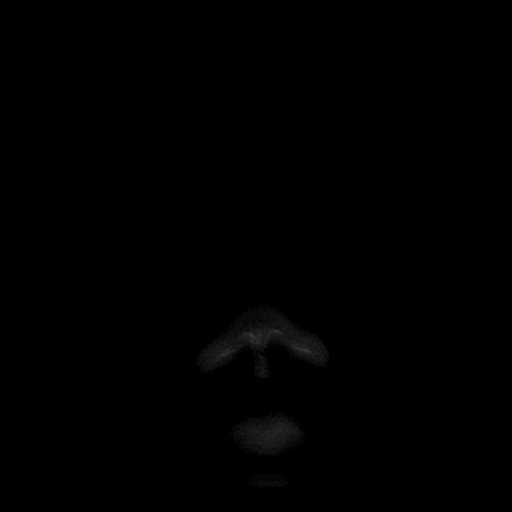
[im 8/29]
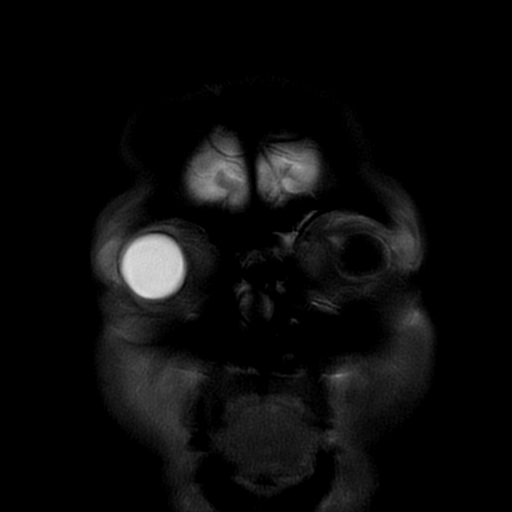
[im 15/29]
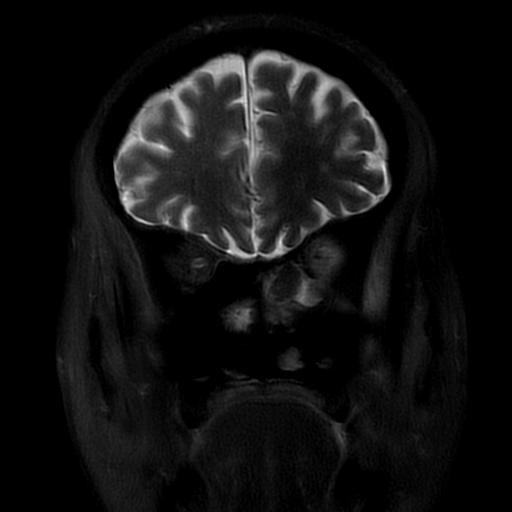
[im 22/29]
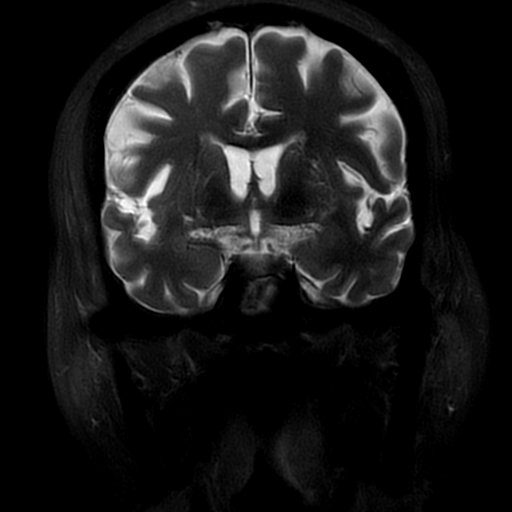
[im 29/29]
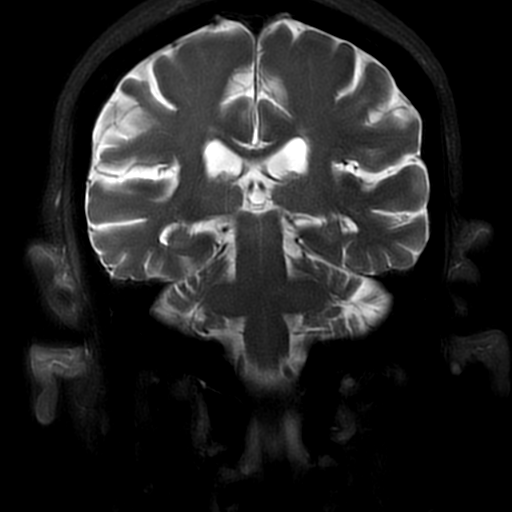

[18 of 48 positions shown; findings below may reference images not displayed]

FINDINGS: Orbits:

--Globes: Chronic postsurgical appearance of the deformed left lobe
is unchanged compared to 10/20/2015.

--Bony orbit: There is bulging laterally of the medial wall of the
left orbit narrowing the left orbital apex, unchanged from the prior
study.

--Preseptal soft tissues: Normal.

--Intra- and extraconal orbital fat: There is loss of the normal fat
signal of the left orbital apex with associated hyper enhancement
following contrast material administration. The more anterior
intraconal fat is relatively spared.

--Optic nerves: There is left perineural hyperenhancement and
chronic atrophy of the left optic nerve.

--Lacrimal glands and fossae: Normal.

--Extraocular muscles: There is edema and hyperenhancement of the
left extraocular muscles, worst at the inferior rectus. The findings
are most pronounced near the orbital apex.

Visualized sinuses: There is extensive left-sided sinus disease
predominantly involving the left ethmoid and sphenoid sinuses. This
is associated with mucosal hyperenhancement and areas of possible
dehiscence along the lateral wall of the left sphenoid sinus (best
demonstrated on series 8 images 8-10).

Soft tissues: Loss of normal signal in the left sphenopalatine
foramen.

Limited intracranial: There is thickening of the dura of the left
middle cranial fossa. Cavernous carotid flow voids are maintained.
Old right cerebellar infarct.
IMPRESSION: 1. Complicated left sphenoethmoidal sinusitis with inflammatory
changes of the structures at the left orbital apex and findings
concerning for dehiscence of the medial orbital wall. In a diabetic
patient, an invasive fungal sinusitis must be considered. A
dedicated maxillofacial CT is recommended to more completely assess
for osseous dehiscence.
2. Left middle cranial fossa dural thickening, likely reactive to
the above-described process.
3. Chronic postsurgical appearance of the left globe. Maxillofacial
CT may also be helpful to assess for potential intracranial spread.

## 2018-05-26 IMAGING — CT CT MAXILLOFACIAL W/O CM
3 of 6 series · 14 of 47 positions shown, 17 images · non-contrast
Comparison: Orbit MRI 6563 hours today. CT head without contrast
10/20/2015.

ADDENDUM:
Study discussed by telephone with Dr. EDDY ORTEGA MORCEAUX on 01/20/2017 at
2121 hours.
CLINICAL DATA: 66-year-old diabetic female with left eye swelling
and drainage for 2 weeks.

Abnormal orbit MRI today with possible osteomyelitis in dehiscence
of the medial left orbital wall.
Underlying left phthisis bulbi, blindness.
EXAM:
CT MAXILLOFACIAL WITHOUT CONTRAST
TECHNIQUE: Multidetector CT imaging of the maxillofacial structures was
performed. Multiplanar CT image reconstructions were also generated.

[Series 3: maxilllofacial 2.0 hr40 3 · axial · 0.30mm/px · z∈[-172,-50]mm · 9 of 73 slices shown, 12 images]
[im 6/73  brain]
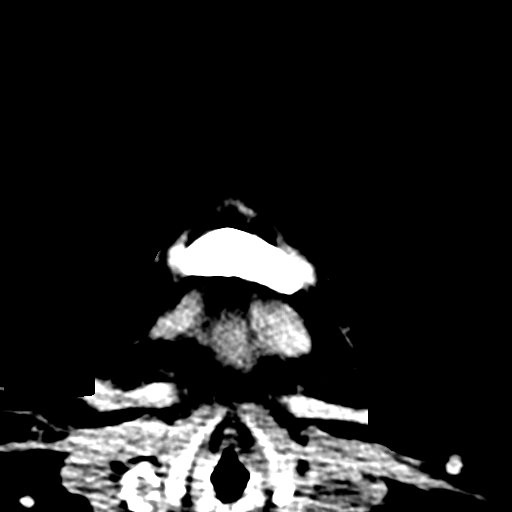
[im 6/73  bone]
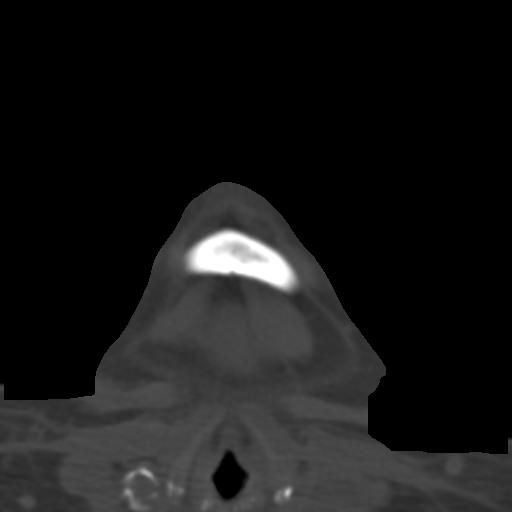
[im 16/73  bone]
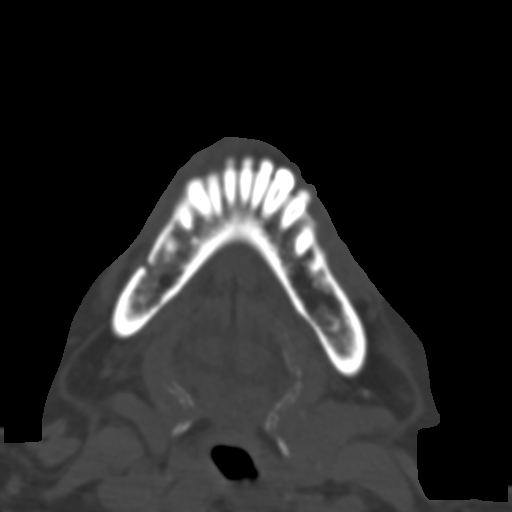
[im 21/73  bone]
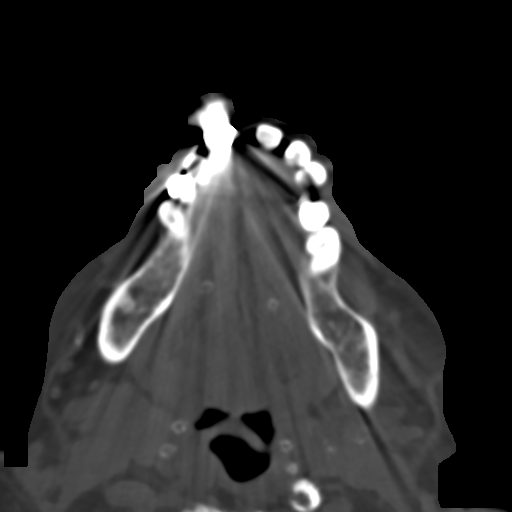
[im 31/73  bone]
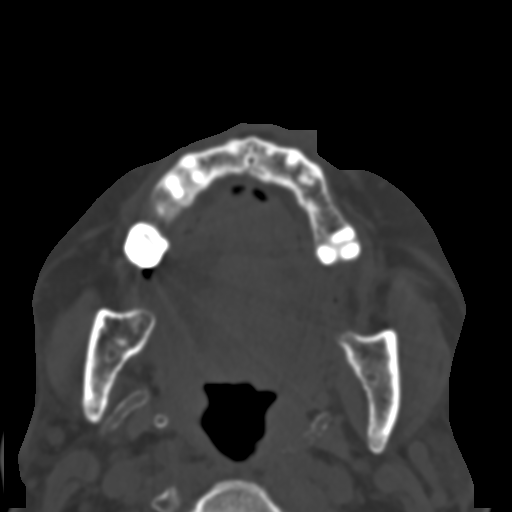
[im 37/73  brain]
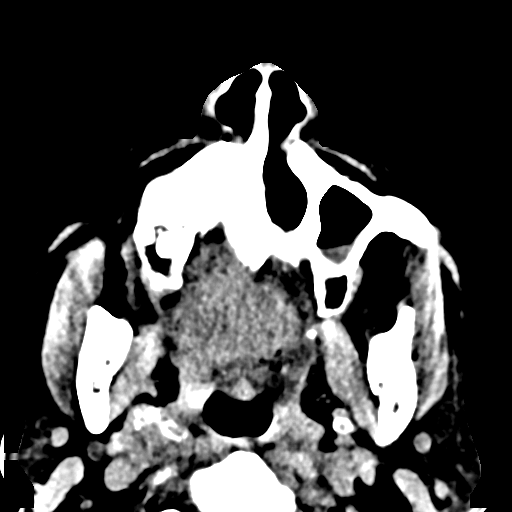
[im 37/73  bone]
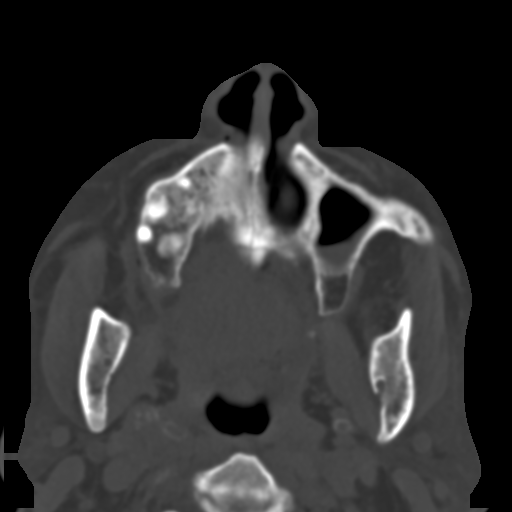
[im 42/73  bone]
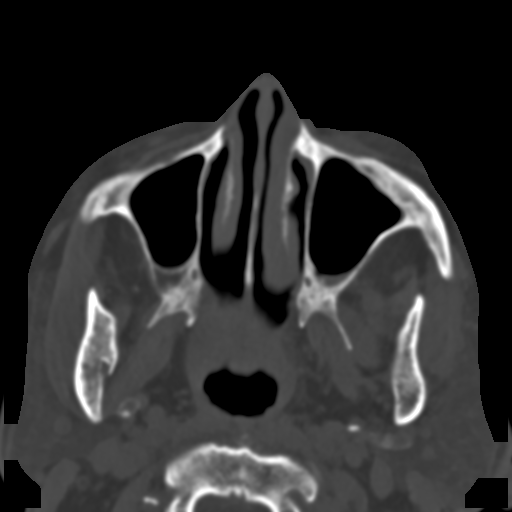
[im 52/73  bone]
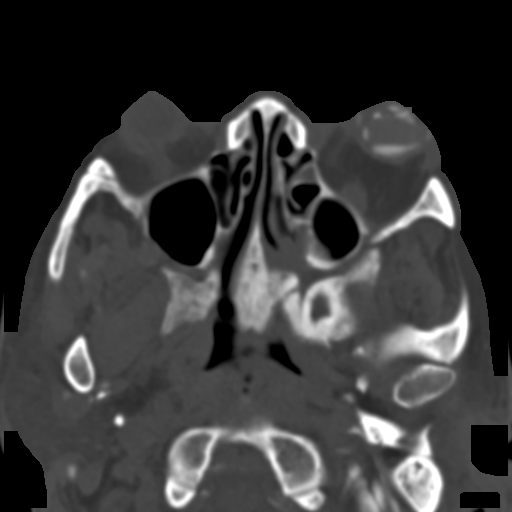
[im 57/73  bone]
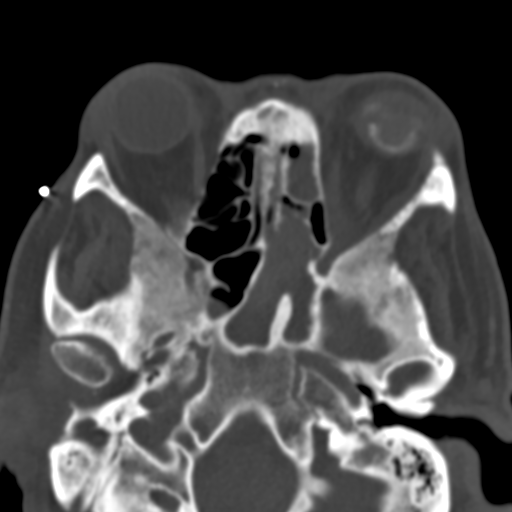
[im 67/73  brain]
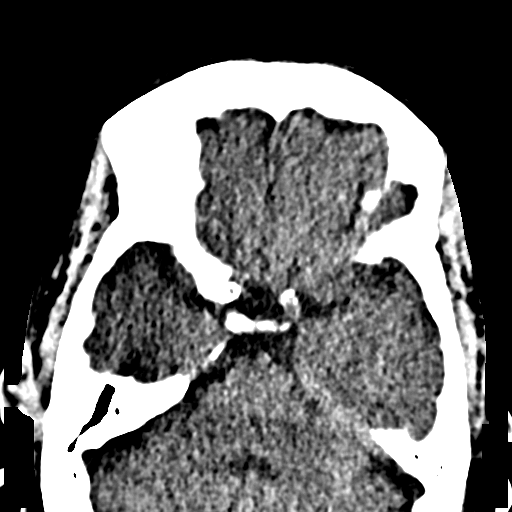
[im 67/73  bone]
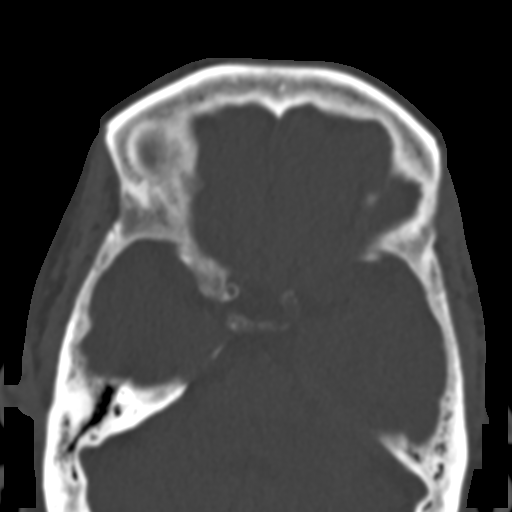

[Series 7: st cor · coronal · 0.28mm/px · 3 of 76 slices shown]
[im 19/76  bone]
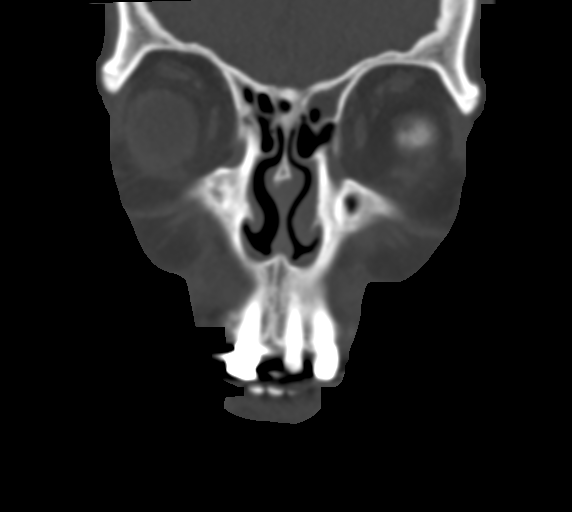
[im 38/76  bone]
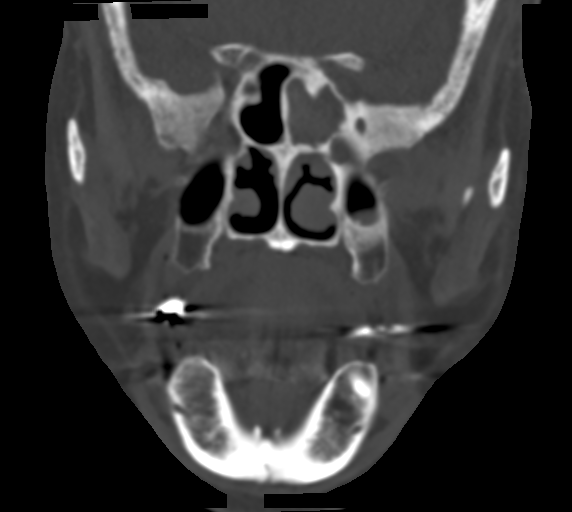
[im 57/76  bone]
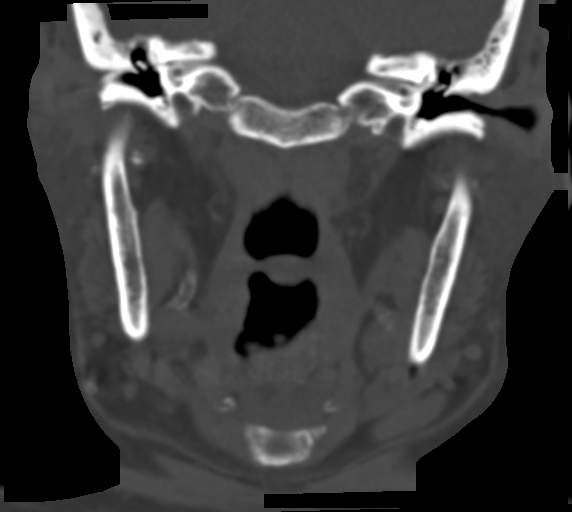

[Series 10: bone sag · sagittal · 0.28mm/px · 2 of 82 slices shown]
[im 28/82  bone]
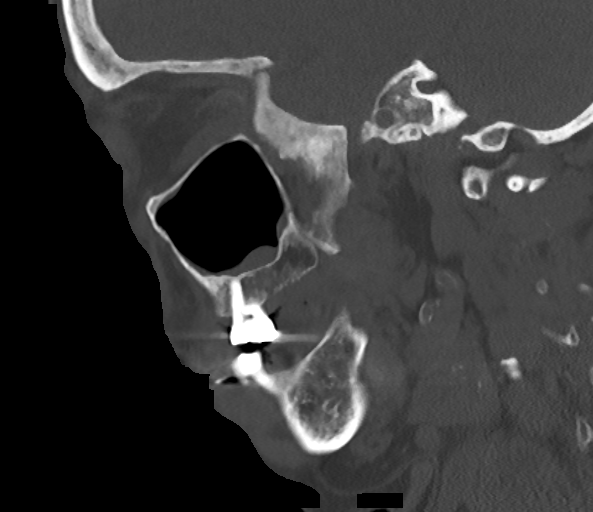
[im 55/82  bone]
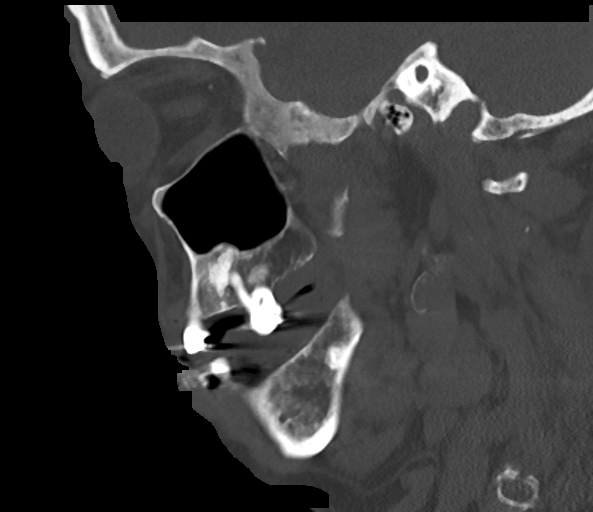

[14 of 47 positions shown; findings below may reference images not displayed]

FINDINGS: Orbits: Chronic abnormal sclerosis and periostitis along the walls
of the left sphenoid sinus. There is evidence for lateral sphenoid
wall dehiscence near the left orbital apex (series 5, image 59), and
cephalad to the left pterygopalatine fossa.

There is also subtle dehiscence suspected along the posterosuperior
lamina papyracea near the anterior ethmoidal artery position on
series 9, image 27.

However, the remaining left orbital walls appear intact.

Abnormal confluent soft tissue stranding an intermediate density
throughout the left orbital apex (series 3, image 59), the
posteromedial left extraconal space (series 3, image 60) the left
pterygopalatine fossa (series 3, image 54) and tracking from their
laterally into the superior left retro maxillary space (coronal
series 7, image 38) corresponding to the abnormal findings on MRI
today.

Superimposed chronic left phthisis bulbi. The right orbit appears
stable since 5961 and normal.

Other Osseous: Generalized chronic skullbase sclerosis. The small
areas of dehiscence are noted about the left orbit stated above, but
there is not underlying permeative change in the left skullbase or
facial bones. Partially visible C5-C6 ankylosis and chronic lower
cervical disc and endplate degeneration.

Sinuses: Abnormal opacified left ethmoid and sphenoid sinuses in
conjunction with the abnormal left orbit.

The left maxillary sinus is well pneumatized, although there are
retro maxillary soft tissue changes associated with the above. The
left frontal sinus is well pneumatized except for the left
frontoethmoidal recess. Only mild mucosal thickening occasionally in
the right paranasal sinuses.

Tympanic cavities are clear. Chronic mastoid sclerosis appears
stable.

Soft tissues: Visible noncontrast larynx, pharynx, parapharyngeal
spaces, sublingual space, and parotid spaces are normal. The visible
retropharyngeal space is normal aside from a retropharyngeal course
of both carotids. Both submandibular glands appear atrophied,
otherwise negative submandibular spaces.

No upper cervical lymphadenopathy.

Widespread calcified atherosclerosis in the upper neck and at the
skullbase.

Limited intracranial: Better evaluated by MRI today. Negative
noncontrast CT appearance of the visible brain parenchyma.
IMPRESSION: 1. Positive for complicated left ethmoid and sphenoid sinusitis as
demonstrated by Orbit MRI today. There is focal Osteomyelitis with
subtle dehiscence at both:
- The superolateral wall of the left sphenoid sinus (adjacent to the
left orbital apex and near the left pterygopalatine fossa), and
- The left superior lamina papyracea it near the anterior ethmoidal
artery.
2. Although Invasive Fungal Sinusitis (Mucormycosis) is possible and
empiric treatment for it is recommended, symptoms over a 2 week
period would be more compatible with a complicated but more indolent
sinus infection.
3. Underlying left phthisis bulbi.

## 2018-06-10 ENCOUNTER — Telehealth: Payer: Self-pay | Admitting: Diagnostic Neuroimaging

## 2018-06-10 NOTE — Telephone Encounter (Signed)
°  Due to current COVID 19 pandemic, our office is severely reducing in office visits until further notice, in order to minimize the risk to our patients and healthcare providers.    Spoke with patient earlier today about scheduling a virtual visit with our office. She did give consent, but said she'd have to have her son Wille Glaser call us back to set it up as he will be assisting with this. Pt understands that although there may be some limitations with this type of visit, we will take all precautions to reduce any security or privacy concerns.  Pt understands that this will be treated like an in office visit and we will file with pt's insurance, and there may be a patient responsible charge related to this service. I also let her know the nurse will be calling her to update her chart.  Patient's son Wille Glaser called in and we scheduled the VV with Dr. Leta Baptist for 6/2. He verbalized understanding of the [email or web address] process and I have sent an e-mail to [email or web address] with link and instructions as well as my name and our office number.

## 2018-06-15 ENCOUNTER — Encounter: Payer: Self-pay | Admitting: *Deleted

## 2018-06-15 NOTE — Telephone Encounter (Signed)
EMR updated with referral notes dated 05/25/18.

## 2018-06-15 NOTE — Addendum Note (Signed)
Addended by: Minna Antis on: 06/15/2018 02:51 PM   Modules accepted: Orders

## 2018-06-16 ENCOUNTER — Other Ambulatory Visit: Payer: Self-pay

## 2018-06-16 ENCOUNTER — Ambulatory Visit (INDEPENDENT_AMBULATORY_CARE_PROVIDER_SITE_OTHER): Payer: Medicare PPO | Admitting: Diagnostic Neuroimaging

## 2018-06-16 ENCOUNTER — Encounter: Payer: Self-pay | Admitting: Diagnostic Neuroimaging

## 2018-06-16 DIAGNOSIS — G40901 Epilepsy, unspecified, not intractable, with status epilepticus: Secondary | ICD-10-CM | POA: Diagnosis not present

## 2018-06-16 NOTE — Progress Notes (Signed)
[facility]  PATIENT: Monica Lee DOB: [birth year]  REFERRING CLINICIAN: Deterding HISTORY FROM: patient and son REASON FOR VISIT: new consult    HISTORICAL  CHIEF COMPLAINT:  Chief Complaint  Patient presents with  . Seizures    HISTORY OF PRESENT ILLNESS:   68 year old female here for evaluation of seizures.  Patient has history of end-stage renal disease status post kidney transplant in 2008.  Patient has history of severe hypertension, hyperlipidemia, diabetes.  Also history of breast cancer, left eye retinopathy and blindness.    Patient was hospitalized in September 2018 with pneumonia, hyperglycemia, status epilepticus.  She was started on Dilantin at that time.  She was recommended to continue for 6 to 12 months of Dilantin and then consider discontinuing or tapering off.  However she did not have any neurology follow-up.  Seizure/status epilepticus was thought to be related to hyperglycemia.  No other secondary causes were found.  Patient is doing well on Dilantin 300 mg at bedtime.  No significant side effects.  Patient referred here to consider possibly tapering off and discontinuing antiseizure medication.  No family history of seizures.  No prior similar history of seizure.  January 2019 patient had headaches, left facial pain, left eye proptosis.  She was diagnosed with mucormycosis of the left sphenoid sinus.  She was hospitalized and treated.  There was some dural enhancement on MRI of the orbits.  No definite extension into the brain parenchyma.   REVIEW OF SYSTEMS: Full 14 system review of systems performed and negative with exception of: As per HPI.  ALLERGIES: Allergies  Allergen Reactions  . 2,4-D Dimethylamine (Amisol) Hives  . Keflex [Cephalexin] Swelling    Swelling to face, chills Tolerates zosyn 02/14/13  . Propoxyphene N-Acetaminophen Hives and Itching    Patient can tolerate acetaminophen solely  . Vancomycin Itching   Itching all over when vancomycin given 09/12/14    HOME MEDICATIONS: Outpatient Medications Prior to Visit  Medication Sig Dispense Refill  . albuterol (ACCUNEB) 0.63 MG/3ML nebulizer solution Take 1 ampule by nebulization every 6 (six) hours as needed for wheezing.    Marland Kitchen albuterol (PROVENTIL HFA;VENTOLIN HFA) 108 (90 BASE) MCG/ACT inhaler Inhale 2 puffs into the lungs every 6 (six) hours as needed for wheezing or shortness of breath. 1 Inhaler 11  . allopurinol (ZYLOPRIM) 300 MG tablet Take 300 mg by mouth daily.    Marland Kitchen amLODipine (NORVASC) 10 MG tablet Take 10 mg by mouth at bedtime.    Marland Kitchen aspirin EC 81 MG tablet Take by mouth daily.    Marland Kitchen atorvastatin (LIPITOR) 40 MG tablet Take 40 mg by mouth daily.    Marland Kitchen BRIMONIDINE TARTRATE OP Apply to eye. 0.2% solution, r eye 2 x daily    . calcitRIOL (ROCALTROL) 0.25 MCG capsule Take by mouth.    . colchicine 0.6 MG tablet Take 0.6 mg by mouth daily.    . Cyanocobalamin (VITAMIN B-12) 2500 MCG SUBL Place under the tongue.    . diphenhydramine-acetaminophen (TYLENOL PM) 25-500 MG TABS tablet Take 1 tablet by mouth at bedtime as needed.    . docusate sodium (COLACE) 100 MG capsule Take 100 mg by mouth 2 (two) times daily.    . furosemide (LASIX) 80 MG tablet Take 160 mg by mouth 3 (three) times daily.    . hydrALAZINE (APRESOLINE) 25 MG tablet Take 25 mg by mouth 2 (two) times a day.    . insulin aspart (NOVOLOG FLEXPEN) 100 UNIT/ML FlexPen Inject 12 Units  into the skin 3 (three) times daily with meals.    . insulin glargine (LANTUS) 100 UNIT/ML injection Inject 30 units into the skin nightly at bedtime    . isosorbide mononitrate (IMDUR) 60 MG 24 hr tablet TAKE 1 TABLET BY MOUTH EVERY DAY**MAKE APPT FOR PHYSICAL AND LABS**FOR FURTURE FILLS (Patient not taking: Reported on 06/15/2018) 90 tablet 0  . KLOR-CON M20 20 MEQ tablet Take 20 mEq by mouth 2 (two) times daily.     Marland Kitchen levothyroxine (SYNTHROID, LEVOTHROID) 75 MCG tablet Take 1 tablet (75 mcg total) by mouth  daily. (Patient taking differently: Take 100 mcg by mouth daily. ) 90 tablet 3  . Magnesium 250 MG TABS Take 250 mg by mouth daily.    . metoprolol tartrate (LOPRESSOR) 100 MG tablet TAKE 1 TABLET(100 MG) BY MOUTH TWICE DAILY(MUST CALL MD FOR APPT FOR FUTURE FILLS) (Patient not taking: Reported on 06/15/2018) 30 tablet 0  . mycophenolate (CELLCEPT) 500 MG tablet Take 1,000 mg by mouth 2 (two) times daily.  6  . nitroGLYCERIN (NITROSTAT) 0.4 MG SL tablet DISSOLVE 1 TABLET UNDER THE TONGUE EVERY 5 MINUTES AS NEEDED FOR CHEST PAIN DO NOT EXCEED 3 DOSES 25 tablet 0  . OXYGEN Inhale 2 L into the lungs daily as needed.    . phenytoin (DILANTIN) 100 MG ER capsule Take 300 mg by mouth at bedtime.    . polyethylene glycol powder (GLYCOLAX/MIRALAX) powder MIX 17GM AS DIRECTED DAILY 527 g 0  . predniSONE (DELTASONE) 10 MG tablet TAKE 1 TABLET(10 MG) BY MOUTH EVERY MORNING 30 tablet 0  . tacrolimus (PROGRAF) 1 MG capsule Take 9 mg by mouth 2 (two) times daily.     . timolol (BETIMOL) 0.25 % ophthalmic solution 1-2 drops 2 (two) times daily.    . timolol (TIMOPTIC) 0.25 % ophthalmic solution Place 1 drop into both eyes 2 (two) times daily.  2  . traMADol (ULTRAM) 50 MG tablet Take 1 tablet (50 mg total) by mouth every 6 (six) hours as needed. (Patient not taking: Reported on 10/20/2015) 15 tablet 0  . traZODone (DESYREL) 50 MG tablet Take 50 mg by mouth at bedtime.     No facility-administered medications prior to visit.     PAST MEDICAL HISTORY: Past Medical History:  Diagnosis Date  . Anemia in chronic kidney disease   . Asthma   . At risk for sleep apnea    STOP-BANG= 4   SENT TO PCP  03-25-2013  . Blind left eye    OCCLUDED CORNEA,  FUNDI--  FAILED SURGERY REPAIR 2003  . Breast cancer ([place]) DX  OCT 2014  ---  STAGE IIA  DCIS (T2  N0)   11-09-2012  RIGHT BREAST LUMPECTOMY W/ SLN DISSECTION---  CHEMOTHERAPY  . Chronic diastolic CHF (congestive heart failure) ([place])    a. Cardiac MRI (10/2012):  Moderate to severe LVH, EF 55%, chordal SAM but no LVOT gradient, mod circumferential effusion, mild LAE.  b.  Echocardiogram (11/06/12): Severe LVH, EF 40-98%, grade 1 diastolic dysfunction, moderate effusion.  . Chronic kidney disease (CKD), stage III (moderate) ([place])    NEPHROLOGIST--  DR Freddrick March  . Coronary artery disease CARDIOLOGIST--  DR Johnsie Cancel   NON-OBSTRUCTIVE CAD  PER CARDIAC CATH  2011  . Diabetic retinopathy    RIGHT EYE  . DJD (degenerative joint disease)   . Gastroparesis due to DM ([place])   . GERD (gastroesophageal reflux disease)   . Gout   . History of CVA (cerebrovascular accident) without  residual deficits    2008  AND TIA IN 2008  W/ NO RESIDUAL  . History of kidney stones   . History of myocardial infarction    2011--  S/P CARDIAC CATH ([place])  NON-OBSTRUCTIVE CAD  . History of peptic ulcer    2011--  RESOLVED  . Hx of colonic polyps   . Hyperlipidemia   . Hyperparathyroidism, secondary renal ([place])   . Hypertension   . Hypothyroidism   . Open thigh wound    ANTERIOR  . OSA (obstructive sleep apnea)   . Pericardial effusion without cardiac tamponade    PER DR NISHAN  NOTE --  NOT MALIGNANT  ? RELATED TO HYPOTHYROIDISM  . Personal history of chemotherapy   . Personal history of radiation therapy   . S/P kidney transplant    CADAVERIC --  05/2006  . Seizures ([place])   . Thyromegaly    MULTINODULER GOITER  . Trigeminal neuralgia   . 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 Wears glasses     PAST SURGICAL HISTORY: Past Surgical History:  Procedure Laterality Date  . BREAST BIOPSY  2010  . BREAST LUMPECTOMY Right 11/09/2012  . BREAST LUMPECTOMY WITH SENTINEL LYMPH NODE BIOPSY Right 11/09/2012   Procedure: RIGHT BREAST LUMPECTOMY WITH SENTINEL LYMPH NODE REMOVAL;  Surgeon: Haywood Lasso, MD;  Location: [facility];  Service: General;  Laterality: Right;  . CARDIAC CATHETERIZATION  04-12-2009  DR EVELYNE GOUDREAU ([facility] IN [place], [place])    NON-OBSTRUCTIVE CAD/  mLAD 40%,  oLAD 50%,  dCFX 40%,  OM1  40%,  mRCA  &  dRCA  50%/  LVEF 65% /  ELEVATED  LVEDP  . CARDIOVASCULAR STRESS TEST  11-04-2012  DR NISHAN   HIGH RISK NUCLEAR STUDY/  FIXED DEFECT AFFECTING ENTIRE INFEROLATERAL AND ANTEROLATERAL WALL/  MODERATE ISCHEMIA  AT MID/ APICAL ANTEROR AND INFERIOR WALL/  GLOBAL HYPOKINESIS/  LVEF 28% (FELT TO BE FALSE +,  ECHO & cMRI  NORMAL EF AND NORMAL WM)  . CATARACT EXTRACTION W/ INTRAOCULAR LENS  IMPLANT, BILATERAL    . CHOLECYSTECTOMY  2008  . EYE SURGERY Left 2003   REPAIR CORNEA  . FOOT SURGERY Right 2013  . HEMORRHOID SURGERY  01/23/2012   Procedure: HEMORRHOIDECTOMY PROLAPSED;  Surgeon: Leighton Ruff, MD;  Location: [facility];  Service: General;  Laterality: N/A;  . INCISION AND DRAINAGE OF WOUND Left 02/18/2013   Procedure: IRRIGATION AND DEBRIDEMENT THIGH WOUND;  Surgeon: Rolm Bookbinder, MD;  Location: [facility];  Service: General;  Laterality: Left;  . IRRIGATION AND DEBRIDEMENT ABSCESS Left 02/14/2013   Procedure: IRRIGATION AND DEBRIDEMENT LEFT THIGH ABSCESS;  Surgeon: Joyice Faster. Cornett, MD;  Location: [facility];  Service: General;  Laterality: Left;  . KIDNEY TRANSPLANT  04/2006  . PORTACATH PLACEMENT Right 11/30/2012   Procedure: INSERTION PORT-A-CATH;  Surgeon: Haywood Lasso, MD;  Location: [facility];  Service: General;  Laterality: Right;  . REPAIR  INTESTINAL PERFORATION SURGERY  01-31-2009  . SKIN SPLIT GRAFT Left 03/29/2013   Procedure: SKIN GRAFT SPLIT THICKNESS WITH A-CELL AND VAC TO LEFT THIGH;  Surgeon: Theodoro Kos, DO;  Location: [facility];  Service: Plastics;  Laterality: Left;  . TRANSPLANTATION RENAL  05/2006   CADAVERIC  . TRANSTHORACIC ECHOCARDIOGRAM  01-19-2013  DR NISHAN   SEVERE LVH/  EF 93-79%/  GRADE I DIASTOLIC DYSFUNCTION/  MODERATE LAE/  MILD IMPROVEMENT OF MODERATE CIRCUMFERENTIAL INFERIOROLATERAL PERICARDIAL EFFUSION WITH NO  TAMPONADE  . VAGINAL  HYSTERECTOMY  1990's   W/  BILATERAL SALPINGOOPHORECTOMY    FAMILY HISTORY: Family History  Problem Relation Age of Onset  . Heart disease Father        heart failure  . Other Mother        during [employer]  . Arthritis Other        parent  . Heart disease Other        parent  . Hypertension Other        parent  . Diabetes Other        parent  . Diabetes Other        grandparent  . Thyroid disease Other        several  . Kidney disease Neg Hx     SOCIAL HISTORY: Social History   Socioeconomic History  . Marital status: Married    Spouse name: Joe  . Number of children: 4  . Years of education: Not on file  . Highest education level: Not on file  Occupational History  . Occupation: Dis[employer] since 2002  Social Needs  . Financial resource strain: Not on file  . Food insecurity:    Worry: Not on file    Inability: Not on file  . Transportation needs:    Medical: Not on file    Non-medical: Not on file  Tobacco Use  . Smoking status: Never Smoker  . Smokeless tobacco: Never Used  Substance and Sexual Activity  . Alcohol use: No  . Drug use: No  . Sexual activity: Yes  Lifestyle  . Physical activity:    Days per week: Not on file    Minutes per session: Not on file  . Stress: Not on file  Relationships  . Social connections:    Talks on phone: Not on file    Gets together: Not on file    Attends religious service: Not on file    Active member of club or organization: Not on file    Attends meetings of clubs or organizations: Not on file    Relationship status: Not on file  . Intimate partner violence:    Fear of current or ex partner: Not on file    Emotionally abused: Not on file    Physically abused: Not on file    Forced sexual activity: Not on file  Other Topics Concern  . Not on file  Social History Narrative   Lives with spouse in [place] but with daughter at times     PHYSICAL EXAM   VIDEO EXAM  GENERAL EXAM/CONSTITUTIONAL:   Vitals: There were no vitals filed for this visit.  There is no height or weight on file to calculate BMI. Wt Readings from Last 3 Encounters:  01/20/17 152 lb (68.9 kg)  03/07/16 186 lb 6.4 oz (84.6 kg)  08/22/15 188 lb (85.3 kg)     Patient is in no distress; well developed, nourished and groomed; neck is supple   NEUROLOGIC: MENTAL STATUS:  No flowsheet data found.  awake, alert, oriented to person, place and time  recent and remote memory intact  normal attention and concentration  language fluent, comprehension intact, naming intact  fund of knowledge appropriate  CRANIAL NERVE:   2nd, 3rd, 4th, 6th - visual fields full to confrontation, extraocular muscles intact, no nystagmus  5th - facial sensation symmetric  7th - facial strength symmetric  8th - hearing intact  11th - shoulder shrug symmetric  12th - tongue  protrusion midline  MOTOR:   NO TREMOR; NO DRIFT IN BUE  SENSORY:   normal and symmetric to light touch  COORDINATION:   fine finger movements normal    DIAGNOSTIC DATA (LABS, IMAGING, TESTING) - I reviewed patient records, labs, notes, testing and imaging myself where available.  Lab Results  Component Value Date   WBC 7.4 01/20/2017   HGB 14.6 01/20/2017   HCT 43.0 01/20/2017   MCV 94.3 01/20/2017   PLT 316 01/20/2017      Component Value Date/Time   NA 137 01/20/2017 1238   NA 144 03/07/2016 1006   K 4.6 01/20/2017 1238   K 3.4 (L) 03/07/2016 1006   CL 100 (L) 01/20/2017 1238   CO2 31 (H) 03/07/2016 1006   GLUCOSE 257 (H) 01/20/2017 1238   GLUCOSE 153 (H) 03/07/2016 1006   BUN 14 01/20/2017 1238   BUN 31.5 (H) 03/07/2016 1006   CREATININE 0.70 01/20/2017 1238   CREATININE 1.2 (H) 03/07/2016 1006   CALCIUM 9.5 03/07/2016 1006   PROT 6.6 03/07/2016 1006   ALBUMIN 3.8 03/07/2016 1006   AST 22 03/07/2016 1006   ALT 20 03/07/2016 1006   ALKPHOS 45 03/07/2016 1006   BILITOT 0.70 03/07/2016 1006   GFRNONAA 58 (L)  10/20/2015 2230   GFRAA >60 10/20/2015 2230   Lab Results  Component Value Date   CHOL 134 09/29/2013   HDL 35.80 (L) 09/29/2013   LDLCALC 68 09/04/2010   LDLDIRECT 57.7 09/29/2013   TRIG 264.0 (H) 09/29/2013   CHOLHDL 4 09/29/2013   Lab Results  Component Value Date   HGBA1C 7.5 11/28/2014   Lab Results  Component Value Date   VITAMINB12 446 11/14/2009   Lab Results  Component Value Date   TSH 19.709 (H) 04/25/2014    01/20/17 MRI orbits [I reviewed images myself and agree with interpretation. -VRP]  1. Complicated left sphenoethmoidal sinusitis with inflammatory changes of the structures at the left orbital apex and findings concerning for dehiscence of the medial orbital wall. In a diabetic patient, an invasive fungal sinusitis must be considered. A dedicated maxillofacial CT is recommended to more completely assess for osseous dehiscence. 2. Left middle cranial fossa dural thickening, likely reactive to the above-described process. 3. Chronic postsurgical appearance of the left globe. Maxillofacial CT may also be helpful to assess for potential intracranial spread.    ASSESSMENT AND PLAN  68 y.o. year old female here with status epilepticus in setting of hyperglycemia and infection in September 2018, doing well on Dilantin.  Also with history of kidney transplant, mucormycosis, hypertension, hyperlipidemia.  Dx:  1. Status epilepticus [facility])    Virtual Visit via Video Note  I connected with [name] on 06/16/18 at  1:00 PM EDT by a video enabled telemedicine application and verified that I am speaking with the correct person using two identifiers.  Location: Patient: home  Provider: office   I discussed the limitations of evaluation and management by telemedicine and the availability of in person appointments. The patient expressed understanding and agreed to proceed.   I discussed the assessment and treatment plan with the patient. The patient was  provided an opportunity to ask questions and all were answered. The patient agreed with the plan and demonstrated an understanding of the instructions.   The patient was advised to call back or seek an in-person evaluation if the symptoms worsen or if the condition fails to improve as anticipated.  I provided 45 minutes of non-face-to-face time  during this encounter.   PLAN:  STATUS EPILEPTICUS (in setting of hyperglycemia and infection; Sept 2018) - continue dilantin 300mg  at bedtime; patient is doing well; no side effects at this time; I would recommend to consider tapering off after completion of 3 years seizure free (~Sept 2021) - follow up in 1 year  Return in about 1 year (around 06/16/2019).    Penni Bombard, MD 09/18/3198, 3:79 PM Certified in Neurology, Neurophysiology and Neuroimaging  [facility] [address] [place], [place] [address] [phone]

## 2019-01-25 ENCOUNTER — Encounter (HOSPITAL_COMMUNITY): Payer: Self-pay | Admitting: Emergency Medicine

## 2019-01-25 ENCOUNTER — Emergency Department (HOSPITAL_COMMUNITY)
Admission: EM | Admit: 2019-01-25 | Discharge: 2019-01-25 | Disposition: A | Discharge status: Home / Self Care | Payer: Medicare (Managed Care) | Attending: Emergency Medicine | Admitting: Emergency Medicine

## 2019-01-25 DIAGNOSIS — Y939 Activity, unspecified: Secondary | ICD-10-CM | POA: Diagnosis not present

## 2019-01-25 DIAGNOSIS — S0502XA Injury of conjunctiva and corneal abrasion without foreign body, left eye, initial encounter: Secondary | ICD-10-CM | POA: Insufficient documentation

## 2019-01-25 DIAGNOSIS — Y999 Unspecified external cause status: Secondary | ICD-10-CM | POA: Insufficient documentation

## 2019-01-25 DIAGNOSIS — I5032 Chronic diastolic (congestive) heart failure: Secondary | ICD-10-CM | POA: Insufficient documentation

## 2019-01-25 DIAGNOSIS — Z853 Personal history of malignant neoplasm of breast: Secondary | ICD-10-CM | POA: Insufficient documentation

## 2019-01-25 DIAGNOSIS — E1122 Type 2 diabetes mellitus with diabetic chronic kidney disease: Secondary | ICD-10-CM | POA: Insufficient documentation

## 2019-01-25 DIAGNOSIS — I130 Hypertensive heart and chronic kidney disease with heart failure and stage 1 through stage 4 chronic kidney disease, or unspecified chronic kidney disease: Secondary | ICD-10-CM | POA: Diagnosis not present

## 2019-01-25 DIAGNOSIS — Z940 Kidney transplant status: Secondary | ICD-10-CM | POA: Insufficient documentation

## 2019-01-25 DIAGNOSIS — Z794 Long term (current) use of insulin: Secondary | ICD-10-CM | POA: Insufficient documentation

## 2019-01-25 DIAGNOSIS — N189 Chronic kidney disease, unspecified: Secondary | ICD-10-CM | POA: Insufficient documentation

## 2019-01-25 DIAGNOSIS — Z7982 Long term (current) use of aspirin: Secondary | ICD-10-CM | POA: Insufficient documentation

## 2019-01-25 DIAGNOSIS — X58XXXA Exposure to other specified factors, initial encounter: Secondary | ICD-10-CM | POA: Diagnosis not present

## 2019-01-25 DIAGNOSIS — Y929 Unspecified place or not applicable: Secondary | ICD-10-CM | POA: Insufficient documentation

## 2019-01-25 DIAGNOSIS — S0592XA Unspecified injury of left eye and orbit, initial encounter: Secondary | ICD-10-CM | POA: Diagnosis present

## 2019-01-25 HISTORY — DX: Mucormycosis, unspecified: B46.5

## 2019-01-25 MED ORDER — TETRACAINE HCL 0.5 % OP SOLN
1.0000 [drp] | Freq: Once | OPHTHALMIC | Status: AC
  Administered 2019-01-25: 18:00:00 2 [drp] via OPHTHALMIC
  Filled 2019-01-25: qty 4

## 2019-01-25 MED ORDER — ERYTHROMYCIN 5 MG/GM OP OINT
1.0000 "application " | TOPICAL_OINTMENT | Freq: Once | OPHTHALMIC | Status: AC
  Administered 2019-01-25: 1 via OPHTHALMIC
  Filled 2019-01-25: qty 3.5

## 2019-01-25 MED ORDER — FLUORESCEIN SODIUM 1 MG OP STRP
ORAL_STRIP | OPHTHALMIC | Status: AC
Start: 2019-01-25 — End: 2019-01-25
  Filled 2019-01-25: qty 1

## 2019-01-25 MED ORDER — FLUORESCEIN SODIUM 1 MG OP STRP: 1.0000 | ORAL_STRIP | Freq: Once | OPHTHALMIC | Status: AC

## 2019-01-25 MED ORDER — ERYTHROMYCIN 5 MG/GM OP OINT: TOPICAL_OINTMENT | OPHTHALMIC | 0 refills | Status: AC

## 2019-01-25 NOTE — ED Triage Notes (Signed)
Pt arrives to ED with left eye infection. Pt has drainage and redness daughter states she has lost vision in this eye years ago and has had infection before was unable to get into eye doctor.

## 2019-01-25 NOTE — Discharge Instructions (Addendum)
Contact a health care provider if: You continue to have eye pain and other symptoms for more than 2 days. You have new symptoms, such as worse redness, tearing, or discharge. You have discharge that makes your eyelids stick together in the morning. Your eye patch becomes so loose that you can blink your eye. Symptoms return after the original abrasion has healed. Get help right away if: You have severe eye pain that does not get better with medicine

## 2019-01-25 NOTE — ED Provider Notes (Signed)
[facility] EMERGENCY DEPARTMENT Provider Note   CSN: [id number] Arrival date & time: 01/25/19  1427     History Chief Complaint  Patient presents with  . Eye Problem    Monica Lee is a 69 y.o. female has a hx of blindness of the left eye x 20 yrs.  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  States that she began having itching on the left eye along with watering.  She has had nasal drainage.  She felt like she might have an allergy except for occasionally the left eye is spinning.  Her daughter noticed some new opacity on the left eye lens.  Previously she has been seen at the [facility] center however she has been many years since she has been there and while the daughter called today she was told she would be a new patient and to come to the emergency room for further evaluation.  HPI     Past Medical History:  Diagnosis Date  . Anemia in chronic kidney disease   . Asthma   . At risk for sleep apnea    STOP-BANG= 4   SENT TO PCP  03-25-2013  . Blind left eye    OCCLUDED CORNEA,  FUNDI--  FAILED SURGERY REPAIR 2003  . Breast cancer ([place]) DX  OCT 2014  ---  STAGE IIA  DCIS (T2  N0)   11-09-2012  RIGHT BREAST LUMPECTOMY W/ SLN DISSECTION---  CHEMOTHERAPY  . Chronic diastolic CHF (congestive heart failure) ([place])    a. Cardiac MRI (10/2012): Moderate to severe LVH, EF 55%, chordal SAM but no LVOT gradient, mod circumferential effusion, mild LAE.  b.  Echocardiogram (11/06/12): Severe LVH, EF 123456, grade 1 diastolic dysfunction, moderate effusion.  . Chronic kidney disease (CKD), stage III (moderate)    NEPHROLOGIST--  DR Freddrick March  . Coronary artery disease CARDIOLOGIST--  DR Johnsie Cancel   NON-OBSTRUCTIVE CAD  PER CARDIAC CATH  2011  . Diabetic retinopathy    RIGHT EYE  . DJD (degenerative joint disease)   . Gastroparesis due to DM ([place])   . GERD (gastroesophageal reflux disease)   . Gout   . History of CVA  (cerebrovascular accident) without residual deficits    2008  AND TIA IN 2008  W/ NO RESIDUAL  . History of kidney stones   . History of myocardial infarction    2011--  S/P CARDIAC CATH ([place])  NON-OBSTRUCTIVE CAD  . History of peptic ulcer    2011--  RESOLVED  . Hx of colonic polyps   . Hyperlipidemia   . Hyperparathyroidism, secondary renal ([place])   . Hypertension   . Hypothyroidism   . Open thigh wound    ANTERIOR  . OSA (obstructive sleep apnea)   . Pericardial effusion without cardiac tamponade    PER DR NISHAN  NOTE --  NOT MALIGNANT  ? RELATED TO HYPOTHYROIDISM  . Personal history of chemotherapy   . Personal history of radiation therapy   . S/P kidney transplant    CADAVERIC --  05/2006  . Seizures ([place])   . Thyromegaly    MULTINODULER GOITER  . Trigeminal neuralgia   . 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 Wears glasses     Patient Active Problem List   Diagnosis Date Noted  . Proptosis 01/20/2017  . Diabetes mellitus following renal transplant ([place]) 03/09/2015  . Gait disorder 01/11/2015  .  Diarrhea 04/25/2014  . CHF exacerbation ([place]) 04/24/2014  . Abscess of arm, left 04/24/2014  . Allergic rhinitis 03/30/2014  . Arthritis 03/30/2014  . History of skin graft 06/14/2013  . OSA (obstructive sleep apnea) 04/08/2013  . Traumatic hematoma of thigh 02/14/2013  . Cellulitis and abscess of leg 02/14/2013  . Essential hypertension, benign 11/19/2012  . S/P kidney transplant 11/19/2012  . CKD (chronic kidney disease) 11/19/2012  . Pericardial effusion 11/19/2012  . Hypothyroidism 11/13/2012  . Acute on chronic diastolic heart failure ([place]) 11/11/2012  . Breast cancer of lower-outer quadrant of right female breast ([place]) 10/14/2012  . Olecranon bursitis of right elbow 10/11/2012  . Right shoulder pain 04/09/2012  . Asthma exacerbation 12/28/2011  . Cellulitis, abdominal wall 12/28/2011  . Hemorrhoids 12/05/2011  . Preventative health  care 12/29/2010  . Multinodular goiter 11/17/2010  . Chronic diastolic heart failure ([place]) 11/27/2009  . DIABETIC  RETINOPATHY 11/14/2009  . Elevated lipids 11/14/2009  . Anemia in neoplastic disease 11/14/2009  . INCREASED INTRAOCULAR PRESSURE 11/14/2009  . MYOCARDIAL INFARCTION, HX OF 11/14/2009  . Coronary atherosclerosis 11/14/2009  . Peptic ulcer 11/14/2009  . Gastroparesis 11/14/2009  . LOW BACK PAIN, CHRONIC 11/14/2009  . TRANSIENT ISCHEMIC ATTACK, HX OF 11/14/2009  . COLONIC POLYPS, HX OF 11/14/2009  . Edema 10/03/2009  . Type 2 diabetes mellitus with circulatory disorder ([place]) 07/14/2009  . Hypertensive heart disease 07/14/2009  . MYOCARDIAL INFARCTION, ACUTE, SUBENDOCARDIAL 07/14/2009  . CAD, NATIVE VESSEL 07/14/2009  . Asthma 07/14/2009  . GERD 07/14/2009  . NEPHROLITHIASIS, HX OF 07/14/2009  . KIDNEY TRANSPLANTATION 07/14/2009    Past Surgical History:  Procedure Laterality Date  . BREAST BIOPSY  2010  . BREAST LUMPECTOMY Right 11/09/2012  . BREAST LUMPECTOMY WITH SENTINEL LYMPH NODE BIOPSY Right 11/09/2012   Procedure: RIGHT BREAST LUMPECTOMY WITH SENTINEL LYMPH NODE REMOVAL;  Surgeon: Haywood Lasso, MD;  Location: [facility];  Service: General;  Laterality: Right;  . CARDIAC CATHETERIZATION  04-12-2009  DR EVELYNE GOUDREAU ([facility] IN [place], [place])   NON-OBSTRUCTIVE CAD/  mLAD 40%,  oLAD 50%,  dCFX 40%,  OM1  40%,  mRCA  &  dRCA  50%/  LVEF 65% /  ELEVATED  LVEDP  . CARDIOVASCULAR STRESS TEST  11-04-2012  DR NISHAN   HIGH RISK NUCLEAR STUDY/  FIXED DEFECT AFFECTING ENTIRE INFEROLATERAL AND ANTEROLATERAL WALL/  MODERATE ISCHEMIA  AT MID/ APICAL ANTEROR AND INFERIOR WALL/  GLOBAL HYPOKINESIS/  LVEF 28% (FELT TO BE FALSE +,  ECHO & cMRI  NORMAL EF AND NORMAL WM)  . CATARACT EXTRACTION W/ INTRAOCULAR LENS  IMPLANT, BILATERAL    . CHOLECYSTECTOMY  2008  . EYE SURGERY Left 2003   REPAIR CORNEA  . FOOT SURGERY Right 2013  . HEMORRHOID SURGERY  01/23/2012   Procedure:  HEMORRHOIDECTOMY PROLAPSED;  Surgeon: Leighton Ruff, MD;  Location: [facility];  Service: General;  Laterality: N/A;  . INCISION AND DRAINAGE OF WOUND Left 02/18/2013   Procedure: IRRIGATION AND DEBRIDEMENT THIGH WOUND;  Surgeon: Rolm Bookbinder, MD;  Location: [facility];  Service: General;  Laterality: Left;  . IRRIGATION AND DEBRIDEMENT ABSCESS Left 02/14/2013   Procedure: IRRIGATION AND DEBRIDEMENT LEFT THIGH ABSCESS;  Surgeon: Joyice Faster. Cornett, MD;  Location: [facility];  Service: General;  Laterality: Left;  . KIDNEY TRANSPLANT  04/2006  . PORTACATH PLACEMENT Right 11/30/2012   Procedure: INSERTION PORT-A-CATH;  Surgeon: Haywood Lasso, MD;  Location: [facility];  Service: General;  Laterality: Right;  .  REPAIR  INTESTINAL PERFORATION SURGERY  01-31-2009  . SKIN SPLIT GRAFT Left 03/29/2013   Procedure: SKIN GRAFT SPLIT THICKNESS WITH A-CELL AND VAC TO LEFT THIGH;  Surgeon: Theodoro Kos, DO;  Location: [facility];  Service: Plastics;  Laterality: Left;  . TRANSPLANTATION RENAL  05/2006   CADAVERIC  . TRANSTHORACIC ECHOCARDIOGRAM  01-19-2013  DR NISHAN   SEVERE LVH/  EF [id number]  GRADE I DIASTOLIC DYSFUNCTION/  MODERATE LAE/  MILD IMPROVEMENT OF MODERATE CIRCUMFERENTIAL INFERIOROLATERAL PERICARDIAL EFFUSION WITH NO TAMPONADE  . VAGINAL HYSTERECTOMY  1990's   W/  BILATERAL SALPINGOOPHORECTOMY     OB History   No obstetric history on file.     Family History  Problem Relation Age of Onset  . Heart disease Father        heart failure  . Other Mother        during [employer]  . Arthritis Other        parent  . Heart disease Other        parent  . Hypertension Other        parent  . Diabetes Other        parent  . Diabetes Other        grandparent  . Thyroid disease Other        several  . Kidney disease Neg Hx     Social History   Tobacco Use  . Smoking status: Never Smoker  . Smokeless tobacco: Never Used  Substance Use Topics    . Alcohol use: No  . Drug use: No    Home Medications Prior to Admission medications   Medication Sig Start Date End Date Taking? Authorizing Provider  albuterol (ACCUNEB) 0.63 MG/3ML nebulizer solution Take 1 ampule by nebulization every 6 (six) hours as needed for wheezing.    [provider]  albuterol (PROVENTIL HFA;VENTOLIN HFA) 108 (90 BASE) MCG/ACT inhaler Inhale 2 puffs into the lungs every 6 (six) hours as needed for wheezing or shortness of breath. 01/04/14   Biagio Borg, MD  allopurinol (ZYLOPRIM) 300 MG tablet Take 300 mg by mouth daily.    [provider]  amLODipine (NORVASC) 10 MG tablet Take 10 mg by mouth at bedtime. 09/28/12   [provider]  aspirin EC 81 MG tablet Take by mouth daily. 02/15/13   [provider]  atorvastatin (LIPITOR) 40 MG tablet Take 40 mg by mouth daily.    [provider]  BRIMONIDINE TARTRATE OP Apply to eye. 0.2% solution, r eye 2 x daily    [provider]  calcitRIOL (ROCALTROL) 0.25 MCG capsule Take by mouth.    [provider]  colchicine 0.6 MG tablet Take 0.6 mg by mouth daily.    [provider]  Cyanocobalamin (VITAMIN B-12) 2500 MCG SUBL Place under the tongue.    [provider]  diphenhydramine-acetaminophen (TYLENOL PM) 25-500 MG TABS tablet Take 1 tablet by mouth at bedtime as needed.    [provider]  docusate sodium (COLACE) 100 MG capsule Take 100 mg by mouth 2 (two) times daily.    [provider]  furosemide (LASIX) 80 MG tablet Take 160 mg by mouth 3 (three) times daily.    [provider]  hydrALAZINE (APRESOLINE) 25 MG tablet Take 25 mg by mouth 2 (two) times a day.    [provider]  insulin aspart (NOVOLOG FLEXPEN) 100 UNIT/ML FlexPen Inject 12 Units into the skin 3 (three) times daily with  meals.    [provider]  insulin glargine (LANTUS) 100 UNIT/ML injection Inject 30 units into the skin nightly  at bedtime 02/23/13   [provider]  isosorbide mononitrate (IMDUR) 60 MG 24 hr tablet TAKE 1 TABLET BY MOUTH EVERY DAY**MAKE APPT FOR PHYSICAL AND LABS**FOR FURTURE FILLS Patient not taking: Reported on 06/15/2018 03/07/16   Gardenia Phlegm, NP  KLOR-CON M20 20 MEQ tablet Take 20 mEq by mouth 2 (two) times daily.  02/11/13   [provider]  levothyroxine (SYNTHROID, LEVOTHROID) 75 MCG tablet Take 1 tablet (75 mcg total) by mouth daily. Patient taking differently: Take 100 mcg by mouth daily.  03/25/13   Biagio Borg, MD  Magnesium 250 MG TABS Take 250 mg by mouth daily.    [provider]  metoprolol tartrate (LOPRESSOR) 100 MG tablet TAKE 1 TABLET(100 MG) BY MOUTH TWICE DAILY(MUST CALL MD FOR APPT FOR FUTURE FILLS) Patient not taking: Reported on 06/15/2018 06/12/16   Josue Hector, MD  mycophenolate (CELLCEPT) 500 MG tablet Take 1,000 mg by mouth 2 (two) times daily. 09/28/15   [provider]  nitroGLYCERIN (NITROSTAT) 0.4 MG SL tablet DISSOLVE 1 TABLET UNDER THE TONGUE EVERY 5 MINUTES AS NEEDED FOR CHEST PAIN DO NOT EXCEED 3 DOSES 03/29/16   Josue Hector, MD  OXYGEN Inhale 2 L into the lungs daily as needed.    [provider]  phenytoin (DILANTIN) 100 MG ER capsule Take 300 mg by mouth at bedtime.    [provider]  polyethylene glycol powder (GLYCOLAX/MIRALAX) powder MIX 17GM AS DIRECTED DAILY 01/30/15   Biagio Borg, MD  predniSONE (DELTASONE) 10 MG tablet TAKE 1 TABLET(10 MG) BY MOUTH EVERY MORNING 07/26/15   Biagio Borg, MD  tacrolimus (PROGRAF) 1 MG capsule Take 9 mg by mouth 2 (two) times daily.     [provider]  timolol (BETIMOL) 0.25 % ophthalmic solution 1-2 drops 2 (two) times daily.    [provider]  timolol (TIMOPTIC) 0.25 % ophthalmic solution Place 1 drop into both eyes 2 (two) times daily. 09/11/15   [provider]  traMADol (ULTRAM) 50 MG tablet Take 1 tablet (50 mg total) by mouth  every 6 (six) hours as needed. Patient not taking: Reported on 10/20/2015 08/21/15   Etta Quill, NP  traZODone (DESYREL) 50 MG tablet Take 50 mg by mouth at bedtime.    [provider]    Allergies    2,4-d dimethylamine (amisol); Keflex [cephalexin]; Propoxyphene n-acetaminophen; and Vancomycin  Review of Systems   Review of Systems Ten systems reviewed and are negative for acute change, except as noted in the HPI.   Physical Exam Updated Vital Signs BP (!) 184/86 (BP Location: Left Arm)   Pulse 64   Temp 98.5 F (36.9 C) (Oral)   Resp 18   SpO2 99%   Physical Exam Vitals and nursing note reviewed.  Constitutional:      General: She is not in acute distress.    Appearance: She is well-developed. She is not diaphoretic.  HENT:     Head: Normocephalic and atraumatic.     Nose:     Comments: No eschar or necrosis    Mouth/Throat:     Comments: No eschar or necrosis Eyes:     General: Lids are normal. No scleral icterus.    Conjunctiva/sclera:     Left eye: Left conjunctiva is injected. No chemosis or exudate.    Pupils:  Left eye: Corneal abrasion and fluorescein uptake present.  Cardiovascular:     Rate and Rhythm: Normal rate and regular rhythm.     Heart sounds: Normal heart sounds. No murmur. No friction rub. No gallop.   Pulmonary:     Effort: Pulmonary effort is normal. No respiratory distress.     Breath sounds: Normal breath sounds.  Abdominal:     General: Bowel sounds are normal. There is no distension.     Palpations: Abdomen is soft. There is no mass.     Tenderness: There is no abdominal tenderness. There is no guarding.  Musculoskeletal:     Cervical back: Normal range of motion.  Skin:    General: Skin is warm and dry.  Neurological:     Mental Status: She is alert and oriented to person, place, and time.  Psychiatric:        Behavior: Behavior normal.     ED Results / Procedures / Treatments   Labs (all labs ordered are listed, but  only abnormal results are displayed) Labs Reviewed - No data to display  EKG None  Radiology No results found.  Procedures Procedures (including critical care time)  Medications Ordered in ED Medications - No data to display  ED Course  I have reviewed the triage vital signs and the nursing notes.  Pertinent labs & imaging results that were available during my care of the patient were reviewed by me and considered in my medical decision making (see chart for details).    MDM Rules/Calculators/A&P                      Patient here with watering L eye.  No facial rashes concerning for herpes keratitis. No zoster. No seidel's sign. + fluerescein uptake. The patient has no evidence of recurrent Mucormycosis infection. She does appear to have punctate abrasions to the L cornea. I have discussed the case with Dr. Manuella Ghazi. He recommends erythromycin ointment and close f/u. Return precautions discussed. Appears appropriate for discharge.  Final Clinical Impression(s) / ED Diagnoses Final diagnoses:  None    Rx / DC Orders ED Discharge Orders    None       Margarita Mail, PA-C 01/26/19 1015    Dorie Rank, MD 01/26/19 1214

## 2019-08-31 ENCOUNTER — Encounter (INDEPENDENT_AMBULATORY_CARE_PROVIDER_SITE_OTHER): Payer: Medicare (Managed Care) | Admitting: Ophthalmology

## 2019-10-14 ENCOUNTER — Telehealth: Payer: Self-pay | Admitting: Diagnostic Neuroimaging

## 2019-10-14 ENCOUNTER — Telehealth: Payer: Self-pay | Admitting: Internal Medicine

## 2019-10-14 NOTE — Telephone Encounter (Signed)
Started note in error

## 2019-10-14 NOTE — Telephone Encounter (Signed)
Called Elder Negus, Case manager from [facility] who stated patient was admitted to home health last week for L sided weakness. She asked patient which neurologist she saw, but patient couldn't remember. I discussed that patient was seen in video visit last summer with son but not for weakness. I advised she needs FU for condiiton she was seen last year. She should see PCP to be evaluated for causes of weakness and if PCP feels she needs to see neuro for this he can send in new referral. Lenor Coffin state she will contact son, daughter and advise them of need for FU here and to see PCP, verbalized understanding, appreciation.

## 2019-10-14 NOTE — Telephone Encounter (Signed)
[facility] Case Manager Monica Lee) called need to know Pt's  current neurologist. Pt has some confusion. Please call.

## 2019-11-02 ENCOUNTER — Other Ambulatory Visit (INDEPENDENT_AMBULATORY_CARE_PROVIDER_SITE_OTHER): Payer: Self-pay | Admitting: Ophthalmology

## 2019-12-16 ENCOUNTER — Other Ambulatory Visit (INDEPENDENT_AMBULATORY_CARE_PROVIDER_SITE_OTHER): Payer: Self-pay | Admitting: Ophthalmology

## 2020-02-01 ENCOUNTER — Other Ambulatory Visit (INDEPENDENT_AMBULATORY_CARE_PROVIDER_SITE_OTHER): Payer: Self-pay | Admitting: Ophthalmology

## 2020-03-15 ENCOUNTER — Other Ambulatory Visit (INDEPENDENT_AMBULATORY_CARE_PROVIDER_SITE_OTHER): Payer: Self-pay | Admitting: Ophthalmology

## 2020-09-28 ENCOUNTER — Other Ambulatory Visit (INDEPENDENT_AMBULATORY_CARE_PROVIDER_SITE_OTHER): Payer: Self-pay | Admitting: Ophthalmology

## 2021-06-27 ENCOUNTER — Other Ambulatory Visit: Payer: Self-pay

## 2021-06-27 ENCOUNTER — Emergency Department (HOSPITAL_COMMUNITY)
Admission: EM | Admit: 2021-06-27 | Discharge: 2021-06-28 | Disposition: A | Discharge status: Other Acute Inpatient Hospital | Payer: Medicare (Managed Care) | Attending: Emergency Medicine | Admitting: Emergency Medicine

## 2021-06-27 DIAGNOSIS — E119 Type 2 diabetes mellitus without complications: Secondary | ICD-10-CM | POA: Diagnosis not present

## 2021-06-27 DIAGNOSIS — Z940 Kidney transplant status: Secondary | ICD-10-CM | POA: Diagnosis not present

## 2021-06-27 DIAGNOSIS — Z7982 Long term (current) use of aspirin: Secondary | ICD-10-CM | POA: Diagnosis not present

## 2021-06-27 DIAGNOSIS — I10 Essential (primary) hypertension: Secondary | ICD-10-CM | POA: Diagnosis not present

## 2021-06-27 DIAGNOSIS — H5789 Other specified disorders of eye and adnexa: Secondary | ICD-10-CM | POA: Diagnosis present

## 2021-06-27 DIAGNOSIS — Z794 Long term (current) use of insulin: Secondary | ICD-10-CM | POA: Insufficient documentation

## 2021-06-27 DIAGNOSIS — Z79899 Other long term (current) drug therapy: Secondary | ICD-10-CM | POA: Diagnosis not present

## 2021-06-27 DIAGNOSIS — H109 Unspecified conjunctivitis: Secondary | ICD-10-CM

## 2021-06-27 DIAGNOSIS — H1033 Unspecified acute conjunctivitis, bilateral: Secondary | ICD-10-CM | POA: Diagnosis not present

## 2021-06-27 LAB — BASIC METABOLIC PANEL
Anion gap: 11 (ref 5–15)
BUN: 18 mg/dL (ref 8–23)
CO2: 26 mmol/L (ref 22–32)
Calcium: 9.5 mg/dL (ref 8.9–10.3)
Chloride: 101 mmol/L (ref 98–111)
Creatinine, Ser: 0.96 mg/dL (ref 0.44–1.00)
GFR, Estimated: >60 mL/min (ref >60)
Glucose, Bld: 133 mg/dL — ABNORMAL HIGH (ref 70–99)
Potassium: 4.5 mmol/L (ref 3.5–5.1)
Sodium: 138 mmol/L (ref 135–145)

## 2021-06-27 LAB — CBC WITH DIFFERENTIAL/PLATELET
Abs Immature Granulocytes: 0.01 10*3/uL (ref 0.00–0.07)
Basophils Absolute: 0 10*3/uL (ref 0.0–0.1)
Basophils Relative: 1 %
Eosinophils Absolute: 0.1 10*3/uL (ref 0.0–0.5)
Eosinophils Relative: 2 %
HCT: 45.5 % (ref 36.0–46.0)
Hemoglobin: 13.9 g/dL (ref 12.0–15.0)
Immature Granulocytes: 0 %
Lymphocytes Relative: 28 %
Lymphs Abs: 1.4 10*3/uL (ref 0.7–4.0)
MCH: 28.8 pg (ref 26.0–34.0)
MCHC: 30.5 g/dL (ref 30.0–36.0)
MCV: 94.4 fL (ref 80.0–100.0)
Monocytes Absolute: 0.6 10*3/uL (ref 0.1–1.0)
Monocytes Relative: 12 %
Neutro Abs: 3 10*3/uL (ref 1.7–7.7)
Neutrophils Relative %: 57 %
Platelets: 204 10*3/uL (ref 150–400)
RBC: 4.82 MIL/uL (ref 3.87–5.11)
RDW: 13.2 % (ref 11.5–15.5)
WBC: 5.2 10*3/uL (ref 4.0–10.5)
nRBC: 0 % (ref 0.0–0.2)

## 2021-06-27 LAB — CBG MONITORING, ED: Glucose-Capillary: 238 mg/dL — ABNORMAL HIGH (ref 70–99)

## 2021-06-27 MED ORDER — SODIUM CHLORIDE 0.9 % IV BOLUS
500.0000 mL | Freq: Once | INTRAVENOUS | Status: AC
  Administered 2021-06-28: 500 mL via INTRAVENOUS

## 2021-06-27 NOTE — ED Triage Notes (Signed)
Pt from home, reports eye redness, itching, and drainage today. Hx of fungal infection in her eyes.

## 2021-06-27 NOTE — Discharge Instructions (Addendum)
Need to follow-up with the ophthalmologist listed below.  They will call you to follow up with you tomorrow.

## 2021-06-27 NOTE — ED Provider Notes (Addendum)
[facility] EMERGENCY DEPARTMENT Provider Note   CSN: [id number] Arrival date & time: 06/27/21  1419     History  Chief Complaint  Patient presents with   Eye Problem    LEISL Lee is a 71 y.o. 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  She has been experiencing URI symptoms for the past week.  This morning woke up with her left eyelids matted together as well as discharge from her right eye.  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  In the right eyes she noticed some blurry vision "like a film over my eyes."  She states that she woke up with some irritation and pain with moving her eyes but this has gradually actually improved throughout the day.  She noticed that her conjunctiva were also red bilaterally.  She has not tried any over-the-counter interventions for the symptoms.  D does not wear any contact lenses or glasses.  Does not currently have an ophthalmologist to follow-up with.  No fevers.   Eye Problem Associated symptoms: discharge, itching and redness   Associated symptoms: no nausea, no photophobia, no vomiting and no weakness        Home Medications Prior to Admission medications   Medication Sig Start Date End Date Taking? Authorizing Provider  albuterol (ACCUNEB) 0.63 MG/3ML nebulizer solution Take 1 ampule by nebulization every 6 (six) hours as needed for wheezing.    [provider]  albuterol (PROVENTIL HFA;VENTOLIN HFA) 108 (90 BASE) MCG/ACT inhaler Inhale 2 puffs into the lungs every 6 (six) hours as needed for wheezing or shortness of breath. 01/04/14   Biagio Borg, MD  allopurinol (ZYLOPRIM) 300 MG tablet Take 300 mg by mouth daily.    [provider]  amLODipine (NORVASC) 10 MG  tablet Take 10 mg by mouth at bedtime. 09/28/12   [provider]  aspirin EC 81 MG tablet Take by mouth daily. 02/15/13   [provider]  atorvastatin (LIPITOR) 40 MG tablet Take 40 mg by mouth daily.    [provider]  brimonidine (ALPHAGAN) 0.2 % ophthalmic solution INSTILL 1 DROP INTO RIGHT EYE TWICE DAILY 10/04/20   Rankin, Clent Demark, MD  BRIMONIDINE TARTRATE OP Apply to eye. 0.2% solution, r eye 2 x daily    [provider]  calcitRIOL (ROCALTROL) 0.25 MCG capsule Take by mouth.    [provider]  colchicine 0.6 MG tablet Take 0.6 mg by mouth daily.    [provider]  Cyanocobalamin (VITAMIN B-12) 2500 MCG SUBL Place under the tongue.    [provider]  diphenhydramine-acetaminophen (TYLENOL PM) 25-500 MG TABS tablet Take 1 tablet by mouth at bedtime as needed.    [provider]  docusate sodium (COLACE) 100 MG capsule Take 100 mg by mouth 2 (two) times daily.    [provider]  erythromycin ophthalmic ointment Place 1/2 inch strip of ointment into the lower lid 4 times a day for 5 days. 01/25/19   Margarita Mail, PA-C  furosemide (LASIX) 80 MG tablet Take 160 mg by mouth 3 (three) times daily.    [provider]  hydrALAZINE (APRESOLINE) 25 MG tablet Take 25 mg by mouth 2 (two) times a day.    [provider]  insulin  aspart (NOVOLOG FLEXPEN) 100 UNIT/ML FlexPen Inject 12 Units into the skin 3 (three) times daily with meals.    [provider]  insulin glargine (LANTUS) 100 UNIT/ML injection Inject 30 units into the skin nightly at bedtime 02/23/13   [provider]  isosorbide mononitrate (IMDUR) 60 MG 24 hr tablet TAKE 1 TABLET BY MOUTH EVERY DAY**MAKE APPT FOR PHYSICAL AND LABS**FOR FURTURE FILLS Patient not taking: Reported on 06/15/2018 03/07/16   Gardenia Phlegm, NP  KLOR-CON M20 20 MEQ tablet Take 20 mEq by mouth 2 (two) times daily.  02/11/13   [provider]  levothyroxine (SYNTHROID, LEVOTHROID) 75 MCG tablet Take 1 tablet (75 mcg total) by mouth daily. Patient taking differently: Take 100 mcg by mouth daily.  03/25/13   Biagio Borg, MD  Magnesium 250 MG TABS Take 250 mg by mouth daily.    [provider]  metoprolol tartrate (LOPRESSOR) 100 MG tablet TAKE 1 TABLET(100 MG) BY MOUTH TWICE DAILY(MUST CALL MD FOR APPT FOR FUTURE FILLS) Patient not taking: Reported on 06/15/2018 06/12/16   Josue Hector, MD  mycophenolate (CELLCEPT) 500 MG tablet Take 1,000 mg by mouth 2 (two) times daily. 09/28/15   [provider]  nitroGLYCERIN (NITROSTAT) 0.4 MG SL tablet DISSOLVE 1 TABLET UNDER THE TONGUE EVERY 5 MINUTES AS NEEDED FOR CHEST PAIN DO NOT EXCEED 3 DOSES 03/29/16   Josue Hector, MD  OXYGEN Inhale 2 L into the lungs daily as needed.    [provider]  phenytoin (DILANTIN) 100 MG ER capsule Take 300 mg by mouth at bedtime.    [provider]  polyethylene glycol powder (GLYCOLAX/MIRALAX) powder MIX 17GM AS DIRECTED DAILY 01/30/15   Biagio Borg, MD  predniSONE (DELTASONE) 10 MG tablet TAKE 1 TABLET(10 MG) BY MOUTH EVERY MORNING 07/26/15   Biagio Borg, MD  tacrolimus (PROGRAF) 1 MG capsule Take 9 mg by mouth 2 (two) times daily.     [provider]  timolol (TIMOPTIC) 0.25 % ophthalmic solution Place 1 drop into both eyes 2 (two) times daily. 09/11/15   [provider]  timolol (TIMOPTIC) 0.5 % ophthalmic solution INSTILL 1 DROP INTO LEFT EYE TWICE DAILY 11/02/19   Rankin, Clent Demark, MD  traMADol (ULTRAM) 50 MG tablet Take 1 tablet (50 mg total) by mouth every 6 (six) hours as needed. Patient not taking: Reported on 10/20/2015 08/21/15   Etta Quill, NP  traZODone (DESYREL) 50 MG tablet Take 50 mg by mouth at bedtime.    [provider]      Allergies    2,4-d dimethylamine; Keflex [cephalexin]; Propoxyphene n-acetaminophen; and Vancomycin    Review of Systems   Review of Systems   Constitutional:  Negative for appetite change, chills and fever.  HENT:  Negative for ear pain, rhinorrhea, sneezing and sore throat.   Eyes:  Positive for pain, discharge, redness and itching. Negative for photophobia and visual disturbance.  Respiratory:  Negative for cough, chest tightness, shortness of breath and wheezing.   Cardiovascular:  Negative for chest pain and palpitations.  Gastrointestinal:  Negative for abdominal pain, blood in stool, constipation, diarrhea, nausea and vomiting.  Genitourinary:  Negative for dysuria, hematuria and urgency.  Musculoskeletal:  Negative for myalgias.  Skin:  Negative for rash.  Neurological:  Negative for dizziness, weakness and light-headedness.    Physical Exam Updated Vital Signs BP (!) 159/73 (BP Location: Left Arm)   Pulse 79   Temp 98.8 F (37.1 C) (  Oral)   Resp 18   SpO2 94%  Physical Exam Vitals and nursing note reviewed.  Constitutional:      General: She is not in acute distress.    Appearance: She is well-developed.  HENT:     Head: Normocephalic and atraumatic.     Nose: Nose normal.  Eyes:     General: No scleral icterus.       Right eye: Discharge present.        Left eye: Discharge present.    Extraocular Movements:     Right eye: Normal extraocular motion.     Left eye: Normal extraocular motion.     Conjunctiva/sclera:     Right eye: Right conjunctiva is injected.     Left eye: Left conjunctiva is injected.     Comments: Bilateral EOMs are intact.  Cardiovascular:     Rate and Rhythm: Normal rate and regular rhythm.     Heart sounds: Normal heart sounds. No murmur heard.    No friction rub. No gallop.  Pulmonary:     Effort: Pulmonary effort is normal. No respiratory distress.     Breath sounds: Normal breath sounds.  Abdominal:     General: Bowel sounds are normal. There is no distension.     Palpations: Abdomen is soft.     Tenderness: There is no abdominal tenderness. There is no guarding.   Musculoskeletal:        General: Normal range of motion.     Cervical back: Normal range of motion and neck supple.  Skin:    General: Skin is warm and dry.     Findings: No rash.  Neurological:     Mental Status: She is alert.     Motor: No abnormal muscle tone.     Coordination: Coordination normal.      ED Results / Procedures / Treatments   Labs (all labs ordered are listed, but only abnormal results are displayed) Labs Reviewed  BASIC METABOLIC PANEL - Abnormal; Notable for the following components:      Result Value   Glucose, Bld 133 (*)    All other components within normal limits  CBG MONITORING, ED - Abnormal; Notable for the following components:   Glucose-Capillary 238 (*)    All other components within normal limits  CBC WITH DIFFERENTIAL/PLATELET    EKG None  Radiology No results found.  Procedures Procedures    Medications Ordered in ED Medications  sodium chloride 0.9 % bolus 500 mL (has no administration in time range)    ED Course/ Medical Decision Making/ A&P                           Medical Decision Making Amount and/or Complexity of Data Reviewed Radiology: ordered.   71 year old female with who is status post renal transplant currently on tacrolimus, mycophenolate, diabetes, hypertension presenting to the ED with a chief complaint of eye irritation.  Woke up this morning with bilateral eye discharge and conjunctival injection.  Has been experiencing URI symptoms for the past week.  She is blind at baseline in her left eye, has been feeling like there is a "film" over her right eye since waking up.  She had irritation and pain in her eyes that has gradually improved since waking up.  On exam there is purulent drainage noted from bilateral eyes with bilateral conjunctival injection as noted in the image above.  She does have upper eyelid swelling on  the right side.  There is no tenderness with palpation surrounding the eye.  EOMs are intact.   Vital signs within normal limits. Consult to ophthalmology, Dr. Tobe Sos informed of patient's history and physical exam findings.  Recommend CT of the orbits with contrast.  If there is any abnormality or concerning findings will reconsult ophthalmology otherwise he feels that she can be followed up in the clinic tomorrow, does not recommend any antibiotic treatment at this time if imaging is unremarkable. She is a renal transplant patient, will order half liter bolus prior to contrast administration. Care and off to oncoming provider pending reassessment after CT of the orbits.    Portions of this note were generated with Lobbyist. Dictation errors may occur despite best attempts at proofreading.          Final Clinical Impression(s) / ED Diagnoses Final diagnoses:  Conjunctivitis of both eyes, unspecified conjunctivitis type    Rx / DC Orders ED Discharge Orders     None         Lulu Riding 06/27/21 2337    Isla Pence, MD 06/27/21 2339    Delia Heady, PA-C 06/27/21 2354    Isla Pence, MD 06/28/21 2022

## 2021-06-27 NOTE — ED Provider Triage Note (Signed)
Emergency Medicine Provider Triage Evaluation Note  Monica Lee , a 71 y.o. female  was evaluated in triage.  Pt complains of URI symptoms over the past several days.  Reports conjunctivitis since yesterday and today associated with eye drainage.  Reports some blurry vision in the right eye.  Cough is nonproductive.  She denies shortness of breath.  Without fever.  Review of Systems  Positive: As above Negative: As above  Physical Exam  BP (!) 175/93 (BP Location: Left Arm)   Pulse 78   Temp 98.8 F (37.1 C) (Oral)   Resp 16   SpO2 99%  Gen:   Awake, no distress  Resp:  Normal effort  MSK:   Moves extremities without difficulty  Other:  Bilateral conjunctivitis with drainage.  Medical Decision Making  Medically screening exam initiated at 3:15 PM.  Appropriate orders placed.  [name] was informed that the remainder of the evaluation will be completed by another provider, this initial triage assessment does not replace that evaluation, and the importance of remaining in the ED until their evaluation is complete.     Evlyn Courier, PA-C 06/27/21 1516

## 2021-06-28 ENCOUNTER — Emergency Department (HOSPITAL_COMMUNITY): Payer: Medicare (Managed Care)

## 2021-06-28 DIAGNOSIS — Z794 Long term (current) use of insulin: Secondary | ICD-10-CM | POA: Diagnosis not present

## 2021-06-28 DIAGNOSIS — H5789 Other specified disorders of eye and adnexa: Secondary | ICD-10-CM | POA: Diagnosis present

## 2021-06-28 DIAGNOSIS — H1033 Unspecified acute conjunctivitis, bilateral: Secondary | ICD-10-CM | POA: Diagnosis not present

## 2021-06-28 DIAGNOSIS — Z79899 Other long term (current) drug therapy: Secondary | ICD-10-CM | POA: Diagnosis not present

## 2021-06-28 DIAGNOSIS — I10 Essential (primary) hypertension: Secondary | ICD-10-CM | POA: Diagnosis not present

## 2021-06-28 DIAGNOSIS — Z940 Kidney transplant status: Secondary | ICD-10-CM | POA: Diagnosis not present

## 2021-06-28 DIAGNOSIS — Z7982 Long term (current) use of aspirin: Secondary | ICD-10-CM | POA: Diagnosis not present

## 2021-06-28 DIAGNOSIS — E119 Type 2 diabetes mellitus without complications: Secondary | ICD-10-CM | POA: Diagnosis not present

## 2021-06-28 LAB — CBG MONITORING, ED: Glucose-Capillary: 129 mg/dL — ABNORMAL HIGH (ref 70–99)

## 2021-06-28 MED ORDER — LINEZOLID 600 MG/300ML IV SOLN
600.0000 mg | Freq: Two times a day (BID) | INTRAVENOUS | Status: DC
  Administered 2021-06-28: 600 mg via INTRAVENOUS
  Filled 2021-06-28: qty 300

## 2021-06-28 MED ORDER — IOHEXOL 300 MG/ML  SOLN
100.0000 mL | Freq: Once | INTRAMUSCULAR | Status: AC | PRN
  Administered 2021-06-28: 75 mL via INTRAVENOUS

## 2021-06-28 MED ORDER — SODIUM CHLORIDE 0.9 % IV SOLN
3.0000 g | Freq: Once | INTRAVENOUS | Status: AC
  Administered 2021-06-28: 3 g via INTRAVENOUS
  Filled 2021-06-28: qty 8

## 2021-06-28 NOTE — ED Provider Notes (Addendum)
Physical Exam  BP (!) 169/93 (BP Location: Left Arm)   Pulse 74   Temp 98.2 F (36.8 C) (Oral)   Resp 18   SpO2 95%   Physical Exam  Procedures  .Critical Care  Performed by: Emeline Darling, PA-C Authorized by: Emeline Darling, PA-C   Critical care provider statement:    Critical care time (minutes):  45   Critical care was time spent personally by me on the following activities:  Development of treatment plan with patient or surrogate, discussions with consultants, evaluation of patient's response to treatment, examination of patient, obtaining history from patient or surrogate, ordering and performing treatments and interventions, ordering and review of laboratory studies, ordering and review of radiographic studies, pulse oximetry and re-evaluation of patient's condition   ED Course / MDM   Clinical Course as of 06/28/21 0715  Thu Jun 28, 2021  0416 Difficulty consulting Dr. Tobe Sos, ophthalmologist, therefore his partner Dr. Noel Christmas was consulted.  Given CT findings concerning for possible postseptal involvement in the left eye there is significant concern given the patient's history of mucormycosis that her presentation could result in necessity for surgical intervention.  He states that this is outside the scope of his practice locally and he does recommend transfer to tertiary care center preferably [facility] for oculoplastics consultation and inpatient management.  I appreciate his collaboration in the care of this patient. [RS]  [phone] Consult to transfer line at [facility], pending call back from ophthalmology team.  [RS]  [phone] Consult to Dr. Unk Lightning, ophthalmologist at [facility] who recommends ED to ED transfer for in person ophthalmologic evaluation.  Accepting physician is EDP Dr. Orland Jarred.  [facility] transfer line to arrange coordination for transport.  I appreciate his collaboration in care of this patient. [RS]  [phone] To ED pharmacist Jeneen Rinks who recommends proceeding with Zyvox given  patient's history of reaction to Vanco as well as Unasyn.  Patient has tolerated multiple  doses of Zosyn in the past.  [facility] ophthalmologist without additional recommendations for IV antibiotics or fungal coverage at this time.  [RS]    Clinical Course User Index [RS] Dollye Glasser, Gypsy Balsam, PA-C   Medical Decision Making Amount and/or Complexity of Data Reviewed Radiology: ordered.  Risk Prescription drug management.     Care of this patient assumed from preceding ED provider Delia Heady, PA-C.  Please see her associated note for further insight with the patient and course.  In brief patient is a 71 year old female who is status post renal transplant on immunosuppression as well as with history of mucormycosis treated in 2019 at [facility], patient declined any ongoing treatment for mucormycosis.  Patient  presented to the emergency department after 6 days of URI symptoms with acute redness, itching, swelling of the eyes, and significant purulent drainage.  At time of shift change patient is awaiting CT of the orbits per recommendation of ophthalmologist Dr. Tobe Sos.  Dispo pending CT orbits.  CT revealed soft tissue edema and swelling about right greater than left preseptal tissues significant for preseptal cellulitis.  Additionally there is confluent soft tissue density within the left orbital apex with extension into the left pterygoid palatine fossa concerning for postseptal infection that may be recurrence of patient's prior illness. Images visualized by this provider.   Consult to Dr. Manuella Ghazi, ophthalmologist, who recommends transfer to [facility] or [facility] ophthalmology for possible surgical intervention and further evaluation of patient's presentation given history of mucormycosis.  Consults to Dr. Unk Lightning, ophthalmologist at [facility] as above  who is agreeable to accept this patient in the ED to ED transfer to [facility] where she will be evaluated by the ophthalmology team and disposition will  be determined.  Patient has been started on IV antibiotics in the emergency department but no antifungal medications have been ordered at this time.  Patient awaiting transport at time of shift change to [place] and her daughter voiced understanding of her medical evaluation and treatment plan.  Each of their questions answered to their expressed affection.  They are amenable to plan for transfer to [facility] at this time.  This chart was dictated using voice recognition software, Dragon. Despite the best efforts of this provider to proofread and correct errors, errors may still occur which can change documentation meaning.     Aura Dials 06/28/21 0716    Quintella Reichert, MD 06/28/21 [phone]

## 2021-06-28 NOTE — ED Notes (Signed)
Patient transported to CT 

## 2021-06-28 NOTE — ED Notes (Addendum)
Transport called via [employer]

## 2021-06-28 NOTE — ED Notes (Signed)
Notified provider of pt's elevated BP

## 2021-06-28 NOTE — ED Notes (Addendum)
Patient is to be transferred to  [facility] - ED [address]  [place], [place] [address]  [phone] - for report Dr. Tawny Asal, accepting physician  [phone] [facility].  D/C summary to go with patient.  Info given by Aldona Bar, transfer center.

## 2021-06-28 NOTE — ED Notes (Signed)
Using the [facility] in [place], at about 10 feet away, the patient was unable to identify any of the letters. Pt stated that the letters were too blurry to make out and she could not see them. Family at bedside was able to inform me that she was close to her baseline and it would be an issue for her to see the letters on a normal day.

## 2021-06-28 NOTE — ED Notes (Signed)
Report given to Vernie Shanks, Agricultural consultant at [facility] ED.

## 2021-07-19 ENCOUNTER — Encounter (INDEPENDENT_AMBULATORY_CARE_PROVIDER_SITE_OTHER): Payer: Self-pay | Admitting: Ophthalmology

## 2021-07-19 ENCOUNTER — Ambulatory Visit (INDEPENDENT_AMBULATORY_CARE_PROVIDER_SITE_OTHER): Payer: Medicare (Managed Care) | Admitting: Ophthalmology

## 2021-07-19 DIAGNOSIS — I676 Nonpyogenic thrombosis of intracranial venous system: Secondary | ICD-10-CM | POA: Diagnosis not present

## 2021-07-19 DIAGNOSIS — H01113 Allergic dermatitis of right eye, unspecified eyelid: Secondary | ICD-10-CM | POA: Diagnosis not present

## 2021-07-19 DIAGNOSIS — E113591 Type 2 diabetes mellitus with proliferative diabetic retinopathy without macular edema, right eye: Secondary | ICD-10-CM

## 2021-07-19 DIAGNOSIS — H35371 Puckering of macula, right eye: Secondary | ICD-10-CM

## 2021-07-19 NOTE — Assessment & Plan Note (Signed)
Stable overall accounts for acuity left eye

## 2021-07-19 NOTE — Progress Notes (Signed)
07/19/2021     CHIEF COMPLAINT Patient presents for No chief complaint on file.     HISTORY OF PRESENT ILLNESS: Monica Lee is a 71 y.o. female who presents to the clinic today for:   HPI   Follow-up for itching and redness of the right eye, patient has a history of using famotidine and timolol right eye for glaucoma control.  Not seen in the office here for over 2-1/2 years.  Recent evaluation emergency room, topical antibiotics delivered.  These will be discontinued Last edited by Hurman Horn, MD on 07/19/2021  3:28 PM.      Referring physician: No referring provider defined for this encounter.  HISTORICAL INFORMATION:   Selected notes from the MEDICAL RECORD NUMBER    Lab Results  Component Value Date   HGBA1C 7.5 11/28/2014     CURRENT MEDICATIONS: Current Outpatient Medications (Ophthalmic Drugs)  Medication Sig   erythromycin ophthalmic ointment Place 1/2 inch strip of ointment into the lower lid 4 times a day for 5 days.   timolol (TIMOPTIC) 0.25 % ophthalmic solution Place 1 drop into both eyes 2 (two) times daily.   timolol (TIMOPTIC) 0.5 % ophthalmic solution INSTILL 1 DROP INTO LEFT EYE TWICE DAILY   No current facility-administered medications for this visit. (Ophthalmic Drugs)   Current Outpatient Medications (Other)  Medication Sig   albuterol (ACCUNEB) 0.63 MG/3ML nebulizer solution Take 1 ampule by nebulization every 6 (six) hours as needed for wheezing.   albuterol (PROVENTIL HFA;VENTOLIN HFA) 108 (90 BASE) MCG/ACT inhaler Inhale 2 puffs into the lungs every 6 (six) hours as needed for wheezing or shortness of breath.   allopurinol (ZYLOPRIM) 300 MG tablet Take 300 mg by mouth daily.   amLODipine (NORVASC) 10 MG tablet Take 10 mg by mouth at bedtime.   aspirin EC 81 MG tablet Take by mouth daily.   atorvastatin (LIPITOR) 40 MG tablet Take 40 mg by mouth daily.   calcitRIOL (ROCALTROL) 0.25 MCG capsule Take by mouth.   colchicine 0.6 MG tablet  Take 0.6 mg by mouth daily.   Cyanocobalamin (VITAMIN B-12) 2500 MCG SUBL Place under the tongue.   diphenhydramine-acetaminophen (TYLENOL PM) 25-500 MG TABS tablet Take 1 tablet by mouth at bedtime as needed.   docusate sodium (COLACE) 100 MG capsule Take 100 mg by mouth 2 (two) times daily.   furosemide (LASIX) 80 MG tablet Take 160 mg by mouth 3 (three) times daily.   hydrALAZINE (APRESOLINE) 25 MG tablet Take 25 mg by mouth 2 (two) times a day.   insulin aspart (NOVOLOG FLEXPEN) 100 UNIT/ML FlexPen Inject 12 Units into the skin 3 (three) times daily with meals.   insulin glargine (LANTUS) 100 UNIT/ML injection Inject 30 units into the skin nightly at bedtime   isosorbide mononitrate (IMDUR) 60 MG 24 hr tablet TAKE 1 TABLET BY MOUTH EVERY DAY**MAKE APPT FOR PHYSICAL AND LABS**FOR FURTURE FILLS (Patient not taking: Reported on 06/15/2018)   KLOR-CON M20 20 MEQ tablet Take 20 mEq by mouth 2 (two) times daily.    levothyroxine (SYNTHROID, LEVOTHROID) 75 MCG tablet Take 1 tablet (75 mcg total) by mouth daily. (Patient taking differently: Take 100 mcg by mouth daily. )   Magnesium 250 MG TABS Take 250 mg by mouth daily.   metoprolol tartrate (LOPRESSOR) 100 MG tablet TAKE 1 TABLET(100 MG) BY MOUTH TWICE DAILY(MUST CALL MD FOR APPT FOR FUTURE FILLS) (Patient not taking: Reported on 06/15/2018)   mycophenolate (CELLCEPT) 500 MG tablet Take 1,000  mg by mouth 2 (two) times daily.   nitroGLYCERIN (NITROSTAT) 0.4 MG SL tablet DISSOLVE 1 TABLET UNDER THE TONGUE EVERY 5 MINUTES AS NEEDED FOR CHEST PAIN DO NOT EXCEED 3 DOSES   OXYGEN Inhale 2 L into the lungs daily as needed.   phenytoin (DILANTIN) 100 MG ER capsule Take 300 mg by mouth at bedtime.   polyethylene glycol powder (GLYCOLAX/MIRALAX) powder MIX 17GM AS DIRECTED DAILY   predniSONE (DELTASONE) 10 MG tablet TAKE 1 TABLET(10 MG) BY MOUTH EVERY MORNING   tacrolimus (PROGRAF) 1 MG capsule Take 9 mg by mouth 2 (two) times daily.    traMADol (ULTRAM) 50 MG  tablet Take 1 tablet (50 mg total) by mouth every 6 (six) hours as needed. (Patient not taking: Reported on 10/20/2015)   traZODone (DESYREL) 50 MG tablet Take 50 mg by mouth at bedtime.   No current facility-administered medications for this visit. (Other)      REVIEW OF SYSTEMS: ROS   Negative for: Constitutional, Gastrointestinal, Neurological, Skin, Genitourinary, Musculoskeletal, HENT, Endocrine, Cardiovascular, Eyes, Respiratory, Psychiatric, Allergic/Imm, Heme/Lymph Last edited by Hurman Horn, MD on 07/19/2021  3:17 PM.       ALLERGIES Allergies  Allergen Reactions   Alphagan [Brimonidine] Itching and Dermatitis   2,4-D Dimethylamine Hives   Keflex [Cephalexin] Swelling    Swelling to face, chills Tolerates zosyn 02/14/13   Propoxyphene N-Acetaminophen Hives and Itching    Patient can tolerate acetaminophen solely   Vancomycin Itching    Itching all over when vancomycin given 09/12/14    PAST MEDICAL HISTORY Past Medical History:  Diagnosis Date   Anemia in chronic kidney disease    Asthma    At risk for sleep apnea    STOP-BANG= 4   SENT TO PCP  03-25-2013   Blind left eye    OCCLUDED CORNEA,  FUNDI--  FAILED SURGERY REPAIR 2003   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 Cardiac MRI (10/2012): Moderate to severe LVH, EF 55%, chordal SAM but no LVOT gradient, mod circumferential effusion, mild LAE.  b.  Echocardiogram (11/06/12): Severe LVH, EF 16-96%, grade 1 diastolic dysfunction, moderate effusion.   Chronic kidney disease (CKD), stage III (moderate) (HCC)    NEPHROLOGIST--  DR DETARDING   Coronary artery disease CARDIOLOGIST--  DR Johnsie Cancel   NON-OBSTRUCTIVE CAD  PER CARDIAC CATH  2011   Diabetic retinopathy    RIGHT EYE   DJD (degenerative joint disease)    Gastroparesis due to DM (HCC)    GERD  (gastroesophageal reflux disease)    Gout    History of CVA (cerebrovascular accident) without residual deficits    2008  AND TIA IN 2008  W/ NO RESIDUAL   History of kidney stones    History of myocardial infarction    2011--  S/P CARDIAC CATH ([place])  NON-OBSTRUCTIVE CAD   History of peptic ulcer    2011--  RESOLVED   Hx of colonic polyps    Hyperlipidemia    Hyperparathyroidism, secondary renal (HCC)    Hypertension    Hypothyroidism    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 ([place])    Wears glasses    Past Surgical History:  Procedure Laterality Date   BREAST BIOPSY  2010   BREAST LUMPECTOMY Right 11/09/2012   BREAST LUMPECTOMY WITH SENTINEL LYMPH NODE BIOPSY Right 11/09/2012   Procedure: RIGHT BREAST LUMPECTOMY WITH SENTINEL LYMPH NODE REMOVAL;  Surgeon: Haywood Lasso, MD;  Location: [place];  Service: General;  Laterality: Right;   CARDIAC CATHETERIZATION  04-12-2009  DR EVELYNE GOUDREAU ([facility] IN [place] [place])   [place] 40%,  oLAD 50%,  dCFX 40%,  OM1  40%,  mRCA  &  dRCA  50%/  LVEF 65% /  ELEVATED  LVEDP   CARDIOVASCULAR STRESS TEST  11-04-2012  DR Johnsie Cancel   HIGH RISK NUCLEAR STUDY/  FIXED DEFECT AFFECTING ENTIRE INFEROLATERAL AND ANTEROLATERAL WALL/  MODERATE ISCHEMIA  AT MID/ APICAL ANTEROR AND INFERIOR WALL/  GLOBAL HYPOKINESIS/  LVEF 28% (FELT TO BE FALSE +,  ECHO & cMRI  NORMAL EF AND NORMAL WM)   CATARACT EXTRACTION W/ INTRAOCULAR LENS  IMPLANT, BILATERAL     CHOLECYSTECTOMY  2008   EYE SURGERY Left 2003   REPAIR CORNEA    FOOT SURGERY Right 2013   HEMORRHOID SURGERY  01/23/2012   Procedure: HEMORRHOIDECTOMY PROLAPSED;  Surgeon: Leighton Ruff, MD;  Location: [facility];  Service: General;  Laterality: N/A;   INCISION AND DRAINAGE OF WOUND Left 02/18/2013   Procedure: IRRIGATION AND DEBRIDEMENT THIGH WOUND;  Surgeon: Rolm Bookbinder, MD;  Location: [place];  Service: General;  Laterality: Left;   IRRIGATION AND DEBRIDEMENT ABSCESS Left 02/14/2013   Procedure: IRRIGATION AND DEBRIDEMENT LEFT THIGH ABSCESS;  Surgeon: Joyice Faster. Cornett, MD;  Location: [facility];  Service: General;  Laterality: Left;   KIDNEY TRANSPLANT  04/2006   PORTACATH PLACEMENT Right 11/30/2012   Procedure: INSERTION PORT-A-CATH;  Surgeon: Haywood Lasso, MD;  Location: [facility];  Service: General;  Laterality: Right;   REPAIR  INTESTINAL PERFORATION SURGERY  01-31-2009   SKIN SPLIT GRAFT Left 03/29/2013   Procedure: SKIN GRAFT SPLIT THICKNESS WITH A-CELL AND VAC TO LEFT THIGH;  Surgeon: Theodoro Kos, DO;  Location: [facility];  Service: Plastics;  Laterality: Left;   TRANSPLANTATION RENAL  05/2006   CADAVERIC   TRANSTHORACIC ECHOCARDIOGRAM  01-19-2013  DR NISHAN   SEVERE LVH/  EF 30-86%/  GRADE I DIASTOLIC DYSFUNCTION/  MODERATE LAE/  MILD IMPROVEMENT OF MODERATE CIRCUMFERENTIAL INFERIOROLATERAL PERICARDIAL EFFUSION WITH NO TAMPONADE   VAGINAL HYSTERECTOMY  1990's   W/  BILATERAL SALPINGOOPHORECTOMY    FAMILY HISTORY Family History  Problem Relation Age of Onset   Heart disease Father        heart failure   Other Mother        during chidbirth   Arthritis Other        parent   Heart disease Other        parent   Hypertension Other        parent   Diabetes Other        parent   Diabetes Other        grandparent   Thyroid  disease Other        several   Kidney disease Neg Hx     SOCIAL HISTORY Social History   Tobacco Use   Smoking status: Never   Smokeless tobacco: Never   Substance Use Topics   Alcohol use: No   Drug use: No         OPHTHALMIC EXAM:  Base Eye Exam     Visual Acuity (ETDRS)       Right Left   Dist [place] 20/100 NLP   Dist ph [place] NI            Slit Lamp and Fundus Exam     External Exam       Right Left   External 2+ lid edema          Slit Lamp Exam       Right Left   Lids/Lashes 2+ lid edema    Conjunctiva/Sclera 1+ Chemosis, 3+ Injection    Cornea Clear    Anterior Chamber Deep and quiet    Iris Round and reactive    Lens Centered posterior chamber intraocular lens    Anterior Vitreous Clear             IMAGING AND PROCEDURES  Imaging and Procedures for 07/19/21           ASSESSMENT/PLAN:  Contact dermatitis of eyelid, right Continue brimonidine immediately  Right epiretinal membrane OD stable over time  Nonpyogenic thrombosis of intracranial venous sinus Stable overall accounts for acuity left eye  Proliferative diabetic retinopathy of right eye ([place]) Follow-up examination once contact dermatitis and condition of the right eye improved     ICD-10-CM   1. Contact dermatitis of eyelid, right  H01.113     2. Right epiretinal membrane  H35.371     3. Nonpyogenic thrombosis of intracranial venous sinus  I67.6     4. 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  2.  3.  Ophthalmic Meds Ordered this visit:  No orders of the defined types were placed in this encounter.      Return in about 2 weeks (around 08/02/2021) for dilate, OD, COLOR FP.  Patient Instructions  Patient instructed to immediately stop the purple top, brimonidine, Alphagan and not to use it ever again  Patient instructed use timolol 1 drop right eye twice daily     Explained the diagnoses, plan, and follow up with the patient and they expressed understanding.  Patient expressed understanding of the importance of  proper follow up care.   Clent Demark Karlin Binion M.D. Diseases & Surgery of the Retina and Vitreous [facility] 07/19/21     Abbreviations: M myopia (nearsighted); A astigmatism; H hyperopia (farsighted); P presbyopia; Mrx spectacle prescription;  CTL contact lenses; OD right eye; OS left eye; OU both eyes  XT exotropia; ET esotropia; PEK punctate epithelial keratitis; PEE punctate epithelial erosions; DES dry eye syndrome; MGD meibomian gland dysfunction; ATs artificial tears; PFAT's preservative free artificial tears; [place] nuclear sclerotic cataract; PSC posterior subcapsular cataract; ERM epi-retinal membrane; PVD posterior vitreous detachment; RD retinal detachment; DM diabetes mellitus; DR diabetic retinopathy; NPDR non-proliferative diabetic retinopathy; PDR proliferative diabetic retinopathy; CSME clinically significant macular edema; DME diabetic macular edema; dbh dot blot hemorrhages; CWS cotton wool spot; POAG primary open angle glaucoma; C/D cup-to-disc ratio; HVF humphrey visual field; GVF goldmann visual field; OCT optical coherence  tomography; IOP intraocular pressure; BRVO Branch retinal vein occlusion; CRVO central retinal vein occlusion; CRAO central retinal artery occlusion; BRAO branch retinal artery occlusion; RT retinal tear; SB scleral buckle; PPV pars plana vitrectomy; VH Vitreous hemorrhage; PRP panretinal laser photocoagulation; IVK intravitreal kenalog; VMT vitreomacular traction; MH Macular hole;  NVD neovascularization of the disc; NVE neovascularization elsewhere; AREDS age related eye disease study; ARMD age related macular degeneration; POAG primary open angle glaucoma; EBMD epithelial/anterior basement membrane dystrophy; ACIOL anterior chamber intraocular lens; IOL intraocular lens; PCIOL posterior chamber intraocular lens; Phaco/IOL phacoemulsification with intraocular lens placement; PRK photorefractive keratectomy; LASIK laser assisted in situ keratomileusis;  HTN hypertension; DM diabetes mellitus; COPD chronic obstructive pulmonary disease   07/19/2021     CHIEF COMPLAINT Patient presents for No chief complaint on file.     HISTORY OF PRESENT ILLNESS: Monica Lee is a 71 y.o. female who presents to the clinic today for:   HPI   Follow-up for itching and redness of the right eye, patient has a history of using famotidine and timolol right eye for glaucoma control.  Not seen in the office here for over 2-1/2 years.  Recent evaluation emergency room, topical antibiotics delivered.  These will be discontinued Last edited by Hurman Horn, MD on 07/19/2021  3:28 PM.      Referring physician: No referring provider defined for this encounter.  HISTORICAL INFORMATION:   Selected notes from the MEDICAL RECORD NUMBER    Lab Results  Component Value Date   HGBA1C 7.5 11/28/2014     CURRENT MEDICATIONS: Current Outpatient Medications (Ophthalmic Drugs)  Medication Sig   erythromycin ophthalmic ointment Place 1/2 inch strip of ointment into the lower lid 4 times a day for 5 days.   timolol (TIMOPTIC) 0.25 % ophthalmic solution Place 1 drop into both eyes 2 (two) times daily.   timolol (TIMOPTIC) 0.5 % ophthalmic solution INSTILL 1 DROP INTO LEFT EYE TWICE DAILY   No current facility-administered medications for this visit. (Ophthalmic Drugs)   Current Outpatient Medications (Other)  Medication Sig   albuterol (ACCUNEB) 0.63 MG/3ML nebulizer solution Take 1 ampule by nebulization every 6 (six) hours as needed for wheezing.   albuterol (PROVENTIL HFA;VENTOLIN HFA) 108 (90 BASE) MCG/ACT inhaler Inhale 2 puffs into the lungs every 6 (six) hours as needed for wheezing or shortness of breath.   allopurinol (ZYLOPRIM) 300 MG tablet Take 300 mg by mouth daily.   amLODipine (NORVASC) 10 MG tablet Take 10 mg by mouth at bedtime.   aspirin EC 81 MG tablet Take by mouth daily.   atorvastatin (LIPITOR) 40 MG tablet Take 40 mg by mouth daily.    calcitRIOL (ROCALTROL) 0.25 MCG capsule Take by mouth.   colchicine 0.6 MG tablet Take 0.6 mg by mouth daily.   Cyanocobalamin (VITAMIN B-12) 2500 MCG SUBL Place under the tongue.   diphenhydramine-acetaminophen (TYLENOL PM) 25-500 MG TABS tablet Take 1 tablet by mouth at bedtime as needed.   docusate sodium (COLACE) 100 MG capsule Take 100 mg by mouth 2 (two) times daily.   furosemide (LASIX) 80 MG tablet Take 160 mg by mouth 3 (three) times daily.   hydrALAZINE (APRESOLINE) 25 MG tablet Take 25 mg by mouth 2 (two) times a day.   insulin aspart (NOVOLOG FLEXPEN) 100 UNIT/ML FlexPen Inject 12 Units into the skin 3 (three) times daily with meals.   insulin glargine (LANTUS) 100 UNIT/ML injection Inject 30 units into the skin nightly at bedtime   isosorbide mononitrate (IMDUR)  60 MG 24 hr tablet TAKE 1 TABLET BY MOUTH EVERY DAY**MAKE APPT FOR PHYSICAL AND LABS**FOR FURTURE FILLS (Patient not taking: Reported on 06/15/2018)   KLOR-CON M20 20 MEQ tablet Take 20 mEq by mouth 2 (two) times daily.    levothyroxine (SYNTHROID, LEVOTHROID) 75 MCG tablet Take 1 tablet (75 mcg total) by mouth daily. (Patient taking differently: Take 100 mcg by mouth daily. )   Magnesium 250 MG TABS Take 250 mg by mouth daily.   metoprolol tartrate (LOPRESSOR) 100 MG tablet TAKE 1 TABLET(100 MG) BY MOUTH TWICE DAILY(MUST CALL MD FOR APPT FOR FUTURE FILLS) (Patient not taking: Reported on 06/15/2018)   mycophenolate (CELLCEPT) 500 MG tablet Take 1,000 mg by mouth 2 (two) times daily.   nitroGLYCERIN (NITROSTAT) 0.4 MG SL tablet DISSOLVE 1 TABLET UNDER THE TONGUE EVERY 5 MINUTES AS NEEDED FOR CHEST PAIN DO NOT EXCEED 3 DOSES   OXYGEN Inhale 2 L into the lungs daily as needed.   phenytoin (DILANTIN) 100 MG ER capsule Take 300 mg by mouth at bedtime.   polyethylene glycol powder (GLYCOLAX/MIRALAX) powder MIX 17GM AS DIRECTED DAILY   predniSONE (DELTASONE) 10 MG tablet TAKE 1 TABLET(10 MG) BY MOUTH EVERY MORNING   tacrolimus  (PROGRAF) 1 MG capsule Take 9 mg by mouth 2 (two) times daily.    traMADol (ULTRAM) 50 MG tablet Take 1 tablet (50 mg total) by mouth every 6 (six) hours as needed. (Patient not taking: Reported on 10/20/2015)   traZODone (DESYREL) 50 MG tablet Take 50 mg by mouth at bedtime.   No current facility-administered medications for this visit. (Other)      REVIEW OF SYSTEMS: ROS   Negative for: Constitutional, Gastrointestinal, Neurological, Skin, Genitourinary, Musculoskeletal, HENT, Endocrine, Cardiovascular, Eyes, Respiratory, Psychiatric, Allergic/Imm, Heme/Lymph Last edited by Hurman Horn, MD on 07/19/2021  3:17 PM.       ALLERGIES Allergies  Allergen Reactions   Alphagan [Brimonidine] Itching and Dermatitis   2,4-D Dimethylamine Hives   Keflex [Cephalexin] Swelling    Swelling to face, chills Tolerates zosyn 02/14/13   Propoxyphene N-Acetaminophen Hives and Itching    Patient can tolerate acetaminophen solely   Vancomycin Itching    Itching all over when vancomycin given 09/12/14    PAST MEDICAL HISTORY Past Medical History:  Diagnosis Date   Anemia in chronic kidney disease    Asthma    At risk for sleep apnea    STOP-BANG= 4   SENT TO PCP  03-25-2013   Blind left eye    OCCLUDED CORNEA,  FUNDI--  FAILED SURGERY REPAIR 2003   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 Cardiac MRI (10/2012): Moderate to severe LVH, EF 55%, chordal SAM but no LVOT gradient, mod circumferential effusion, mild LAE.  b.  Echocardiogram (11/06/12): Severe LVH, EF 23-76%, grade 1 diastolic dysfunction, moderate effusion.   Chronic kidney disease (CKD), stage III (moderate) (HCC)    NEPHROLOGIST--  DR DETARDING   Coronary artery disease CARDIOLOGIST--  DR Johnsie Cancel   NON-OBSTRUCTIVE CAD  PER CARDIAC CATH  2011   Diabetic retinopathy    RIGHT  EYE   DJD (degenerative joint disease)    Gastroparesis due to DM (HCC)    GERD (gastroesophageal reflux disease)    Gout    History of CVA (cerebrovascular accident)  without residual deficits    2008  AND TIA IN 2008  W/ NO RESIDUAL   History of kidney stones    History of myocardial infarction    2011--  S/P CARDIAC CATH ([place])  NON-OBSTRUCTIVE CAD   History of peptic ulcer    2011--  RESOLVED   Hx of colonic polyps    Hyperlipidemia    Hyperparathyroidism, secondary renal (HCC)    Hypertension    Hypothyroidism    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 ([place])    Wears glasses    Past Surgical History:  Procedure Laterality Date   BREAST BIOPSY  2010   BREAST LUMPECTOMY Right 11/09/2012   BREAST LUMPECTOMY WITH SENTINEL LYMPH NODE BIOPSY Right 11/09/2012   Procedure: RIGHT BREAST LUMPECTOMY WITH SENTINEL LYMPH NODE REMOVAL;  Surgeon: Haywood Lasso, MD;  Location: [facility];  Service: General;  Laterality: Right;   CARDIAC CATHETERIZATION  04-12-2009  DR EVELYNE GOUDREAU ([facility] IN [place] [place])   [place] 40%,  oLAD 50%,  dCFX 40%,  OM1  40%,  mRCA  &  dRCA  50%/  LVEF 65% /  ELEVATED  LVEDP   CARDIOVASCULAR STRESS TEST  11-04-2012  DR Johnsie Cancel   HIGH RISK NUCLEAR STUDY/  FIXED DEFECT AFFECTING ENTIRE INFEROLATERAL AND ANTEROLATERAL WALL/  MODERATE ISCHEMIA  AT MID/ APICAL ANTEROR AND INFERIOR WALL/  GLOBAL HYPOKINESIS/  LVEF 28% (FELT TO BE FALSE +,  ECHO & cMRI  NORMAL EF AND NORMAL WM)   CATARACT EXTRACTION W/ INTRAOCULAR LENS   IMPLANT, BILATERAL     CHOLECYSTECTOMY  2008   EYE SURGERY Left 2003   REPAIR CORNEA   FOOT SURGERY Right 2013   HEMORRHOID SURGERY  01/23/2012   Procedure: HEMORRHOIDECTOMY PROLAPSED;  Surgeon: Leighton Ruff, MD;  Location: [facility];  Service: General;  Laterality: N/A;   INCISION AND DRAINAGE OF WOUND Left 02/18/2013   Procedure: IRRIGATION AND DEBRIDEMENT THIGH WOUND;  Surgeon: Rolm Bookbinder, MD;  Location: [facility];  Service: General;  Laterality: Left;   IRRIGATION AND DEBRIDEMENT ABSCESS Left 02/14/2013   Procedure: IRRIGATION AND DEBRIDEMENT LEFT THIGH ABSCESS;  Surgeon: Joyice Faster. Cornett, MD;  Location: [facility];  Service: General;  Laterality: Left;   KIDNEY TRANSPLANT  04/2006   PORTACATH PLACEMENT Right 11/30/2012   Procedure: INSERTION PORT-A-CATH;  Surgeon: Haywood Lasso, MD;  Location: [facility];  Service: General;  Laterality: Right;   REPAIR  INTESTINAL PERFORATION SURGERY  01-31-2009   SKIN SPLIT GRAFT Left 03/29/2013   Procedure: SKIN GRAFT SPLIT THICKNESS WITH A-CELL AND VAC TO LEFT THIGH;  Surgeon: Theodoro Kos, DO;  Location: [facility];  Service: Plastics;  Laterality: Left;   TRANSPLANTATION RENAL  05/2006   CADAVERIC   TRANSTHORACIC ECHOCARDIOGRAM  01-19-2013  DR NISHAN   SEVERE LVH/  EF 32-95%/  GRADE I DIASTOLIC DYSFUNCTION/  MODERATE LAE/  MILD IMPROVEMENT OF MODERATE CIRCUMFERENTIAL INFERIOROLATERAL PERICARDIAL EFFUSION  WITH NO TAMPONADE   VAGINAL HYSTERECTOMY  32's   W/  BILATERAL SALPINGOOPHORECTOMY    FAMILY HISTORY Family History  Problem Relation Age of Onset   Heart disease Father        heart failure   Other Mother        during chidbirth   Arthritis Other        parent   Heart disease Other        parent   Hypertension Other        parent   Diabetes Other        parent   Diabetes Other        grandparent   Thyroid disease Other        several   Kidney disease Neg Hx     SOCIAL  HISTORY Social History   Tobacco Use   Smoking status: Never   Smokeless tobacco: Never  Substance Use Topics   Alcohol use: No   Drug use: No         OPHTHALMIC EXAM:  Base Eye Exam     Visual Acuity (ETDRS)       Right Left   Dist [place] 20/100 NLP   Dist ph [place] NI            Slit Lamp and Fundus Exam     External Exam       Right Left   External 2+ lid edema          Slit Lamp Exam       Right Left   Lids/Lashes 2+ lid edema    Conjunctiva/Sclera 1+ Chemosis, 3+ Injection    Cornea Clear    Anterior Chamber Deep and quiet    Iris Round and reactive    Lens Centered posterior chamber intraocular lens    Anterior Vitreous Clear             IMAGING AND PROCEDURES  Imaging and Procedures for 07/19/21           ASSESSMENT/PLAN:  Contact dermatitis of eyelid, right Continue brimonidine immediately  Right epiretinal membrane OD stable over time  Nonpyogenic thrombosis of intracranial venous sinus Stable overall accounts for acuity left eye  Proliferative diabetic retinopathy of right eye ([place]) Follow-up examination once contact dermatitis and condition of the right eye improved     ICD-10-CM   1. Contact dermatitis of eyelid, right  H01.113     2. Right epiretinal membrane  H35.371     3. Nonpyogenic thrombosis of intracranial venous sinus  I67.6     4. 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  2.  3.  Ophthalmic Meds Ordered this visit:  No orders of the defined types were placed in this encounter.      Return in about 2 weeks (around 08/02/2021) for dilate, OD, COLOR FP.  Patient Instructions  Patient instructed to immediately stop the purple top, brimonidine, Alphagan and not to use it ever again  Patient instructed use timolol 1 drop right eye twice daily     Explained the diagnoses, plan, and follow up with the  patient and they expressed understanding.  Patient expressed understanding of the importance of proper follow up care.   Clent Demark Lorimer Tiberio M.D. Diseases & Surgery of the Retina and Vitreous Retina & Diabetic [place] 07/19/21     Abbreviations: M myopia (nearsighted); A  astigmatism; H hyperopia (farsighted); P presbyopia; Mrx spectacle prescription;  CTL contact lenses; OD right eye; OS left eye; OU both eyes  XT exotropia; ET esotropia; PEK punctate epithelial keratitis; PEE punctate epithelial erosions; DES dry eye syndrome; MGD meibomian gland dysfunction; ATs artificial tears; PFAT's preservative free artificial tears; [place] nuclear sclerotic cataract; PSC posterior subcapsular cataract; ERM epi-retinal membrane; PVD posterior vitreous detachment; RD retinal detachment; DM diabetes mellitus; DR diabetic retinopathy; NPDR non-proliferative diabetic retinopathy; PDR proliferative diabetic retinopathy; CSME clinically significant macular edema; DME diabetic macular edema; dbh dot blot hemorrhages; CWS cotton wool spot; POAG primary open angle glaucoma; C/D cup-to-disc ratio; HVF humphrey visual field; GVF goldmann visual field; OCT optical coherence tomography; IOP intraocular pressure; BRVO Branch retinal vein occlusion; CRVO central retinal vein occlusion; CRAO central retinal artery occlusion; BRAO branch retinal artery occlusion; RT retinal tear; SB scleral buckle; PPV pars plana vitrectomy; VH Vitreous hemorrhage; PRP panretinal laser photocoagulation; IVK intravitreal kenalog; VMT vitreomacular traction; MH Macular hole;  NVD neovascularization of the disc; NVE neovascularization elsewhere; AREDS age related eye disease study; ARMD age related macular degeneration; POAG primary open angle glaucoma; EBMD epithelial/anterior basement membrane dystrophy; ACIOL anterior chamber intraocular lens; IOL intraocular lens; PCIOL posterior chamber intraocular lens; Phaco/IOL phacoemulsification with intraocular  lens placement; [place] photorefractive keratectomy; LASIK laser assisted in situ keratomileusis; HTN hypertension; DM diabetes mellitus; COPD chronic obstructive pulmonary disease

## 2021-07-19 NOTE — Assessment & Plan Note (Signed)
Follow-up examination once contact dermatitis and condition of the right eye improved

## 2021-07-19 NOTE — Assessment & Plan Note (Signed)
OD stable over time

## 2021-07-19 NOTE — Patient Instructions (Signed)
Patient instructed to immediately stop the purple top, brimonidine, Alphagan and not to use it ever again  Patient instructed use timolol 1 drop right eye twice daily

## 2021-07-19 NOTE — Assessment & Plan Note (Signed)
Continue brimonidine immediately

## 2021-08-02 ENCOUNTER — Encounter (INDEPENDENT_AMBULATORY_CARE_PROVIDER_SITE_OTHER): Payer: Self-pay | Admitting: Ophthalmology

## 2021-08-02 ENCOUNTER — Ambulatory Visit (INDEPENDENT_AMBULATORY_CARE_PROVIDER_SITE_OTHER): Payer: Medicare (Managed Care) | Admitting: Ophthalmology

## 2021-08-02 DIAGNOSIS — H18422 Band keratopathy, left eye: Secondary | ICD-10-CM

## 2021-08-02 DIAGNOSIS — H01113 Allergic dermatitis of right eye, unspecified eyelid: Secondary | ICD-10-CM

## 2021-08-02 DIAGNOSIS — H35371 Puckering of macula, right eye: Secondary | ICD-10-CM | POA: Diagnosis not present

## 2021-08-02 MED ORDER — TIMOLOL MALEATE 0.5 % OP SOLN: OPHTHALMIC | 12 refills | Status: AC

## 2021-08-02 MED ORDER — PREDNISOLONE ACETATE 1 % OP SUSP: 1.0000 [drp] | OPHTHALMIC | 2 refills | Status: AC

## 2021-08-02 NOTE — Assessment & Plan Note (Signed)
Continue with teardrops as needed basis to control the symptomatic changes from the calcific band keratopathy and foreign body sensation on current on occasion

## 2021-08-02 NOTE — Assessment & Plan Note (Signed)
OD, quiescent PDR now for the last 6 years.  Stable acuity observe

## 2021-08-02 NOTE — Progress Notes (Signed)
08/02/2021     CHIEF COMPLAINT Patient presents for  Chief Complaint  Patient presents with   Retina Evaluation      HISTORY OF PRESENT ILLNESS: Monica Lee is a 71 y.o. female who presents to the clinic today for:   HPI     Retina Evaluation           Laterality: right eye         Comments   2 weeks dilate OD COLOR FP Pt states her vision has been stable  Pt eyes are still red and ichy from 2 weeks ago  Pt admits to new floaters in right eye and follow-up for recent contact dermatitis induced by topical medication brimonidine last 2 weeks      Last edited by Hurman Horn, MD on 08/02/2021  3:26 PM.      Referring physician: No referring provider defined for this encounter.  HISTORICAL INFORMATION:   Selected notes from the MEDICAL RECORD NUMBER    Lab Results  Component Value Date   HGBA1C 7.5 11/28/2014     CURRENT MEDICATIONS: Current Outpatient Medications (Ophthalmic Drugs)  Medication Sig   prednisoLONE acetate (PRED FORTE) 1 % ophthalmic suspension Place 1 drop into the left eye every other day.   erythromycin ophthalmic ointment Place 1/2 inch strip of ointment into the lower lid 4 times a day for 5 days.   timolol (TIMOPTIC) 0.5 % ophthalmic solution INSTILL 1 DROP INTO LEFT EYE TWICE DAILY   No current facility-administered medications for this visit. (Ophthalmic Drugs)   Current Outpatient Medications (Other)  Medication Sig   albuterol (ACCUNEB) 0.63 MG/3ML nebulizer solution Take 1 ampule by nebulization every 6 (six) hours as needed for wheezing.   albuterol (PROVENTIL HFA;VENTOLIN HFA) 108 (90 BASE) MCG/ACT inhaler Inhale 2 puffs into the lungs every 6 (six) hours as needed for wheezing or shortness of breath.   allopurinol (ZYLOPRIM) 300 MG tablet Take 300 mg by mouth daily.   amLODipine (NORVASC) 10 MG tablet Take 10 mg by mouth at bedtime.   aspirin EC 81 MG tablet Take by mouth daily.   atorvastatin (LIPITOR) 40 MG tablet Take  40 mg by mouth daily.   calcitRIOL (ROCALTROL) 0.25 MCG capsule Take by mouth.   colchicine 0.6 MG tablet Take 0.6 mg by mouth daily.   Cyanocobalamin (VITAMIN B-12) 2500 MCG SUBL Place under the tongue.   diphenhydramine-acetaminophen (TYLENOL PM) 25-500 MG TABS tablet Take 1 tablet by mouth at bedtime as needed.   docusate sodium (COLACE) 100 MG capsule Take 100 mg by mouth 2 (two) times daily.   furosemide (LASIX) 80 MG tablet Take 160 mg by mouth 3 (three) times daily.   hydrALAZINE (APRESOLINE) 25 MG tablet Take 25 mg by mouth 2 (two) times a day.   insulin aspart (NOVOLOG FLEXPEN) 100 UNIT/ML FlexPen Inject 12 Units into the skin 3 (three) times daily with meals.   insulin glargine (LANTUS) 100 UNIT/ML injection Inject 30 units into the skin nightly at bedtime   isosorbide mononitrate (IMDUR) 60 MG 24 hr tablet TAKE 1 TABLET BY MOUTH EVERY DAY**MAKE APPT FOR PHYSICAL AND LABS**FOR FURTURE FILLS (Patient not taking: Reported on 06/15/2018)   KLOR-CON M20 20 MEQ tablet Take 20 mEq by mouth 2 (two) times daily.    levothyroxine (SYNTHROID, LEVOTHROID) 75 MCG tablet Take 1 tablet (75 mcg total) by mouth daily. (Patient taking differently: Take 100 mcg by mouth daily. )   Magnesium 250 MG TABS Take  250 mg by mouth daily.   metoprolol tartrate (LOPRESSOR) 100 MG tablet TAKE 1 TABLET(100 MG) BY MOUTH TWICE DAILY(MUST CALL MD FOR APPT FOR FUTURE FILLS) (Patient not taking: Reported on 06/15/2018)   mycophenolate (CELLCEPT) 500 MG tablet Take 1,000 mg by mouth 2 (two) times daily.   nitroGLYCERIN (NITROSTAT) 0.4 MG SL tablet DISSOLVE 1 TABLET UNDER THE TONGUE EVERY 5 MINUTES AS NEEDED FOR CHEST PAIN DO NOT EXCEED 3 DOSES   OXYGEN Inhale 2 L into the lungs daily as needed.   phenytoin (DILANTIN) 100 MG ER capsule Take 300 mg by mouth at bedtime.   polyethylene glycol powder (GLYCOLAX/MIRALAX) powder MIX 17GM AS DIRECTED DAILY   predniSONE (DELTASONE) 10 MG tablet TAKE 1 TABLET(10 MG) BY MOUTH EVERY  MORNING   tacrolimus (PROGRAF) 1 MG capsule Take 9 mg by mouth 2 (two) times daily.    traMADol (ULTRAM) 50 MG tablet Take 1 tablet (50 mg total) by mouth every 6 (six) hours as needed. (Patient not taking: Reported on 10/20/2015)   traZODone (DESYREL) 50 MG tablet Take 50 mg by mouth at bedtime.   No current facility-administered medications for this visit. (Other)      REVIEW OF SYSTEMS: ROS   Negative for: Constitutional, Gastrointestinal, Neurological, Skin, Genitourinary, Musculoskeletal, HENT, Endocrine, Cardiovascular, Eyes, Respiratory, Psychiatric, Allergic/Imm, Heme/Lymph Last edited by Morene Rankins, CMA on 08/02/2021  2:28 PM.       ALLERGIES Allergies  Allergen Reactions   Alphagan [Brimonidine] Itching and Dermatitis   2,4-D Dimethylamine Hives   Keflex [Cephalexin] Swelling    Swelling to face, chills Tolerates zosyn 02/14/13   Propoxyphene N-Acetaminophen Hives and Itching    Patient can tolerate acetaminophen solely   Vancomycin Itching    Itching all over when vancomycin given 09/12/14    PAST MEDICAL HISTORY Past Medical History:  Diagnosis Date   Anemia in chronic kidney disease    Asthma    At risk for sleep apnea    STOP-BANG= 4   SENT TO PCP  03-25-2013   Blind left eye    OCCLUDED CORNEA,  FUNDI--  FAILED SURGERY REPAIR 2003   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 Cardiac MRI (10/2012): Moderate to severe LVH, EF 55%, chordal SAM but no LVOT gradient, mod circumferential effusion, mild LAE.  b.  Echocardiogram (11/06/12): Severe LVH, EF 09-81%, grade 1 diastolic dysfunction, moderate effusion.   Chronic kidney disease (CKD), stage III (moderate) (HCC)    NEPHROLOGIST--  DR DETARDING   Coronary artery disease CARDIOLOGIST--  DR Johnsie Cancel   NON-OBSTRUCTIVE CAD  PER CARDIAC CATH  2011    Diabetic retinopathy    RIGHT EYE   DJD (degenerative joint disease)    Gastroparesis due to DM (HCC)    GERD (gastroesophageal reflux disease)    Gout    History of CVA (cerebrovascular accident) without residual deficits    2008  AND TIA IN 2008  W/ NO RESIDUAL   History of kidney stones    History of myocardial infarction    2011--  S/P CARDIAC CATH ([place])  NON-OBSTRUCTIVE CAD   History of peptic ulcer    2011--  RESOLVED   Hx of colonic polyps    Hyperlipidemia    Hyperparathyroidism, secondary renal ([place])    Hypertension  Hypothyroidism    Mucormycosis (HCC)    Open thigh wound    ANTERIOR   OSA (obstructive sleep apnea)    Pericardial effusion without cardiac tamponade    PER DR NISHAN  NOTE --  NOT MALIGNANT  ? 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 ([place])    Wears glasses    Past Surgical History:  Procedure Laterality Date   BREAST BIOPSY  2010   BREAST LUMPECTOMY Right 11/09/2012   BREAST LUMPECTOMY WITH SENTINEL LYMPH NODE BIOPSY Right 11/09/2012   Procedure: RIGHT BREAST LUMPECTOMY WITH SENTINEL LYMPH NODE REMOVAL;  Surgeon: Haywood Lasso, MD;  Location: [facility];  Service: General;  Laterality: Right;   CARDIAC CATHETERIZATION  04-12-2009  DR EVELYNE GOUDREAU ([facility] IN [place], [place])   [facility] 40%,  oLAD 50%,  dCFX 40%,  OM1  40%,  mRCA  &  dRCA  50%/  LVEF 65% /  ELEVATED  LVEDP   CARDIOVASCULAR STRESS TEST  11-04-2012  DR Johnsie Cancel   HIGH RISK NUCLEAR STUDY/  FIXED DEFECT AFFECTING ENTIRE INFEROLATERAL AND ANTEROLATERAL WALL/  MODERATE ISCHEMIA  AT MID/ APICAL ANTEROR AND INFERIOR WALL/  GLOBAL HYPOKINESIS/  LVEF 28% (FELT TO BE FALSE +,  ECHO & cMRI  NORMAL EF AND NORMAL WM)   CATARACT  EXTRACTION W/ INTRAOCULAR LENS  IMPLANT, BILATERAL     CHOLECYSTECTOMY  2008   EYE SURGERY Left 2003   REPAIR CORNEA   FOOT SURGERY Right 2013   HEMORRHOID SURGERY  01/23/2012   Procedure: HEMORRHOIDECTOMY PROLAPSED;  Surgeon: Leighton Ruff, MD;  Location: [facility];  Service: General;  Laterality: N/A;   INCISION AND DRAINAGE OF WOUND Left 02/18/2013   Procedure: IRRIGATION AND DEBRIDEMENT THIGH WOUND;  Surgeon: Rolm Bookbinder, MD;  Location: [facility];  Service: General;  Laterality: Left;   IRRIGATION AND DEBRIDEMENT ABSCESS Left 02/14/2013   Procedure: IRRIGATION AND DEBRIDEMENT LEFT THIGH ABSCESS;  Surgeon: Joyice Faster. Cornett, MD;  Location: [facility];  Service: General;  Laterality: Left;   KIDNEY TRANSPLANT  04/2006   PORTACATH PLACEMENT Right 11/30/2012   Procedure: INSERTION PORT-A-CATH;  Surgeon: Haywood Lasso, MD;  Location: [facility];  Service: General;  Laterality: Right;   REPAIR  INTESTINAL PERFORATION SURGERY  01-31-2009   SKIN SPLIT GRAFT Left 03/29/2013   Procedure: SKIN GRAFT SPLIT THICKNESS WITH A-CELL AND VAC TO LEFT THIGH;  Surgeon: Theodoro Kos, DO;  Location: [facility];  Service: Plastics;  Laterality: Left;   TRANSPLANTATION RENAL  05/2006   CADAVERIC   TRANSTHORACIC ECHOCARDIOGRAM  01-19-2013  DR NISHAN   SEVERE LVH/  EF 69-67%/  GRADE I DIASTOLIC DYSFUNCTION/  MODERATE LAE/  MILD IMPROVEMENT OF MODERATE CIRCUMFERENTIAL INFERIOROLATERAL PERICARDIAL EFFUSION WITH NO TAMPONADE   VAGINAL HYSTERECTOMY  1990's   W/  BILATERAL SALPINGOOPHORECTOMY    FAMILY HISTORY Family History  Problem Relation Age of Onset   Heart disease Father        heart failure   Other Mother        during chidbirth   Arthritis Other        parent   Heart disease Other  parent   Hypertension Other        parent   Diabetes Other        parent   Diabetes Other        grandparent   Thyroid disease Other        several   Kidney disease  Neg Hx     SOCIAL HISTORY Social History   Tobacco Use   Smoking status: Never   Smokeless tobacco: Never  Substance Use Topics   Alcohol use: No   Drug use: No         OPHTHALMIC EXAM:  Base Eye Exam     Visual Acuity (ETDRS)       Right Left   Dist [place] 20/200 NLP         Tonometry (Tonopen, 2:41 PM)       Right Left   Pressure 13          Pupils       APD   Right    Left +4         Extraocular Movement       Right Left    Full Full         Dilation     Right eye: 2.5% Phenylephrine, 1.0% Mydriacyl @ 2:42 PM           Slit Lamp and Fundus Exam     External Exam       Right Left   External 2+ lid edema          Slit Lamp Exam       Right Left   Lids/Lashes Normal    Conjunctiva/Sclera White and quiet 3+ Injection, no follicular reaction no discharge   Cornea Clear    Anterior Chamber Deep and quiet    [place] and reactive    Lens Centered posterior chamber intraocular lens IOL central with 360 posterior synechia.   Anterior Vitreous Clear          Fundus Exam       Right Left   Posterior Vitreous Clear media, old fibrovascular disease of the right eye.  Stable since 2017 OD No view posteriorly   Disc Old fibrous neovascularization not progressive    C/D Ratio 0.35    Macula Attached    Vessels Quiescent PDR yet with extensive fibrous tractional changes macula not threatened observe    Periphery Good PRP 360             IMAGING AND PROCEDURES  Imaging and Procedures for 08/02/21  Color Fundus Photography Optos - OU - Both Eyes       Right Eye Disc findings include neovascularization.   Notes Stable since 2017 OD with quiescent PDR old fibrovascular changes stable acuity macula not threatened monocular status observe             ASSESSMENT/PLAN:  Proliferative diabetic retinopathy of right eye ([place]) OD, quiescent PDR now for the last 6 years.  Stable acuity observe  Band keratopathy of left  eye Continue with teardrops as needed basis to control the symptomatic changes from the calcific band keratopathy and foreign body sensation on current on occasion  Contact dermatitis of eyelid, right Contact dermatitis vastly improved in the right eye with cessation of brimonidine.  Patient understands never to use it again     ICD-10-CM   1. Right epiretinal membrane  H35.371 Color Fundus Photography Optos - OU - Both Eyes    2. Band keratopathy of  left eye  H18.422     3. Contact dermatitis of eyelid, right  H01.113       1.  OD doing very well and normal intraocular pressure with cessation of use of topical brimonidine  2.  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  Extensive PDR OD, stable over time observe  Ophthalmic Meds Ordered this visit:  Meds ordered this encounter  Medications   prednisoLONE acetate (PRED FORTE) 1 % ophthalmic suspension    Sig: Place 1 drop into the left eye every other day.    Dispense:  10 mL    Refill:  2   timolol (TIMOPTIC) 0.5 % ophthalmic solution    Sig: INSTILL 1 DROP INTO LEFT EYE TWICE DAILY    Dispense:  5 mL    Refill:  12       Return in about 9 months (around 05/04/2022) for dilate, OD, COLOR FP, OCT.  There are no Patient Instructions on file for this visit.   Explained the diagnoses, plan, and follow up with the patient and they expressed understanding.  Patient expressed understanding of the importance of proper follow up care.   Clent Demark Marca Gadsby M.D. Diseases & Surgery of the Retina and Vitreous Retina & Diabetic [place] 08/02/21     Abbreviations: M myopia (nearsighted); A astigmatism; H hyperopia (farsighted); P presbyopia; Mrx spectacle prescription;  CTL contact lenses; OD right eye; OS left eye; OU both eyes  XT exotropia; ET esotropia; PEK punctate epithelial keratitis; PEE  punctate epithelial erosions; DES dry eye syndrome; MGD meibomian gland dysfunction; ATs artificial tears; PFAT's preservative free artificial tears; [place] nuclear sclerotic cataract; PSC posterior subcapsular cataract; ERM epi-retinal membrane; PVD posterior vitreous detachment; RD retinal detachment; DM diabetes mellitus; DR diabetic retinopathy; NPDR non-proliferative diabetic retinopathy; PDR proliferative diabetic retinopathy; CSME clinically significant macular edema; DME diabetic macular edema; dbh dot blot hemorrhages; CWS cotton wool spot; POAG primary open angle glaucoma; C/D cup-to-disc ratio; HVF humphrey visual field; GVF goldmann visual field; OCT optical coherence tomography; IOP intraocular pressure; BRVO Branch retinal vein occlusion; CRVO central retinal vein occlusion; CRAO central retinal artery occlusion; BRAO branch retinal artery occlusion; RT retinal tear; SB scleral buckle; PPV pars plana vitrectomy; VH Vitreous hemorrhage; PRP panretinal laser photocoagulation; IVK intravitreal kenalog; VMT vitreomacular traction; MH Macular hole;  NVD neovascularization of the disc; NVE neovascularization elsewhere; AREDS age related eye disease study; ARMD age related macular degeneration; POAG primary open angle glaucoma; EBMD epithelial/anterior basement membrane dystrophy; ACIOL anterior chamber intraocular lens; IOL intraocular lens; PCIOL posterior chamber intraocular lens; Phaco/IOL phacoemulsification with intraocular lens placement; [place] photorefractive keratectomy; LASIK laser assisted in situ keratomileusis; HTN hypertension; DM diabetes mellitus; COPD chronic obstructive pulmonary disease

## 2021-08-02 NOTE — Assessment & Plan Note (Signed)
Contact dermatitis vastly improved in the right eye with cessation of brimonidine.  Patient understands never to use it again

## 2022-05-06 ENCOUNTER — Encounter (INDEPENDENT_AMBULATORY_CARE_PROVIDER_SITE_OTHER): Payer: Medicare (Managed Care) | Admitting: Ophthalmology
